# Patient Record
Sex: Female | Born: 1950
Health system: Southern US, Community
[De-identification: ages and names within clinical notes are randomized; demographics above are authoritative.]

## PROBLEM LIST (undated history)

## (undated) ENCOUNTER — Encounter

## (undated) ENCOUNTER — Encounter: Attending: Neurology | Primary: Neurology

## (undated) ENCOUNTER — Ambulatory Visit

## (undated) ENCOUNTER — Encounter: Attending: Nurse Practitioner | Primary: Nurse Practitioner

## (undated) ENCOUNTER — Encounter: Attending: Cardiovascular Disease | Primary: Cardiovascular Disease

## (undated) ENCOUNTER — Telehealth

## (undated) ENCOUNTER — Ambulatory Visit: Payer: Medicare (Managed Care) | Attending: Cardiovascular Disease | Primary: Cardiovascular Disease

## (undated) ENCOUNTER — Ambulatory Visit
Payer: Medicare (Managed Care) | Attending: Student in an Organized Health Care Education/Training Program | Primary: Student in an Organized Health Care Education/Training Program

## (undated) ENCOUNTER — Ambulatory Visit: Payer: PRIVATE HEALTH INSURANCE

## (undated) ENCOUNTER — Ambulatory Visit: Payer: PRIVATE HEALTH INSURANCE | Attending: "Endocrinology | Primary: "Endocrinology

## (undated) ENCOUNTER — Telehealth: Attending: Neurology | Primary: Neurology

## (undated) ENCOUNTER — Ambulatory Visit: Payer: Medicare (Managed Care) | Attending: Neurology | Primary: Neurology

## (undated) ENCOUNTER — Ambulatory Visit: Payer: Medicare (Managed Care)

## (undated) ENCOUNTER — Encounter
Attending: Student in an Organized Health Care Education/Training Program | Primary: Student in an Organized Health Care Education/Training Program

## (undated) ENCOUNTER — Encounter: Attending: Pulmonary Disease | Primary: Pulmonary Disease

## (undated) ENCOUNTER — Telehealth: Attending: Family Medicine | Primary: Family Medicine

## (undated) ENCOUNTER — Telehealth
Attending: Student in an Organized Health Care Education/Training Program | Primary: Student in an Organized Health Care Education/Training Program

## (undated) ENCOUNTER — Telehealth: Attending: Radiation Oncology | Primary: Radiation Oncology

## (undated) ENCOUNTER — Ambulatory Visit: Payer: PRIVATE HEALTH INSURANCE | Attending: Neurology | Primary: Neurology

## (undated) ENCOUNTER — Encounter: Attending: Family | Primary: Family

## (undated) ENCOUNTER — Ambulatory Visit: Payer: PRIVATE HEALTH INSURANCE | Attending: Family | Primary: Family

## (undated) ENCOUNTER — Ambulatory Visit
Payer: PRIVATE HEALTH INSURANCE | Attending: Student in an Organized Health Care Education/Training Program | Primary: Student in an Organized Health Care Education/Training Program

## (undated) ENCOUNTER — Encounter: Attending: Adult Health | Primary: Adult Health

## (undated) ENCOUNTER — Other Ambulatory Visit

## (undated) ENCOUNTER — Ambulatory Visit: Attending: Radiation Oncology | Primary: Radiation Oncology

## (undated) ENCOUNTER — Ambulatory Visit: Payer: PRIVATE HEALTH INSURANCE | Attending: Cardiovascular Disease | Primary: Cardiovascular Disease

## (undated) ENCOUNTER — Ambulatory Visit: Payer: PRIVATE HEALTH INSURANCE | Attending: Adult Health | Primary: Adult Health

## (undated) ENCOUNTER — Encounter: Attending: Pharmacotherapy | Primary: Pharmacotherapy

## (undated) ENCOUNTER — Ambulatory Visit: Payer: PRIVATE HEALTH INSURANCE | Attending: Pulmonary Disease | Primary: Pulmonary Disease

## (undated) ENCOUNTER — Telehealth: Attending: Family | Primary: Family

## (undated) ENCOUNTER — Encounter: Attending: Pharmacist | Primary: Pharmacist

## (undated) ENCOUNTER — Encounter: Attending: Internal Medicine | Primary: Internal Medicine

## (undated) ENCOUNTER — Telehealth: Attending: Adult Health | Primary: Adult Health

## (undated) ENCOUNTER — Encounter: Attending: Physician Assistant | Primary: Physician Assistant

## (undated) ENCOUNTER — Ambulatory Visit
Attending: Student in an Organized Health Care Education/Training Program | Primary: Student in an Organized Health Care Education/Training Program

## (undated) ENCOUNTER — Encounter: Attending: Registered" | Primary: Registered"

## (undated) ENCOUNTER — Encounter: Attending: Radiation Oncology | Primary: Radiation Oncology

## (undated) ENCOUNTER — Ambulatory Visit: Payer: PRIVATE HEALTH INSURANCE | Attending: Pharmacotherapy | Primary: Pharmacotherapy

## (undated) DIAGNOSIS — J449 Chronic obstructive pulmonary disease, unspecified: Secondary | ICD-10-CM

## (undated) DIAGNOSIS — A770 Spotted fever due to Rickettsia rickettsii: Secondary | ICD-10-CM

## (undated) DIAGNOSIS — T733XXA Exhaustion due to excessive exertion, initial encounter: Secondary | ICD-10-CM

## (undated) DIAGNOSIS — E119 Type 2 diabetes mellitus without complications: Secondary | ICD-10-CM

## (undated) DIAGNOSIS — R519 Headache, unspecified: Secondary | ICD-10-CM

## (undated) DIAGNOSIS — G8929 Other chronic pain: Secondary | ICD-10-CM

## (undated) DIAGNOSIS — E785 Hyperlipidemia, unspecified: Secondary | ICD-10-CM

## (undated) DIAGNOSIS — R51 Headache: Secondary | ICD-10-CM

## (undated) DIAGNOSIS — C801 Malignant (primary) neoplasm, unspecified: Secondary | ICD-10-CM

## (undated) DIAGNOSIS — I2510 Atherosclerotic heart disease of native coronary artery without angina pectoris: Secondary | ICD-10-CM

## (undated) DIAGNOSIS — K219 Gastro-esophageal reflux disease without esophagitis: Secondary | ICD-10-CM

## (undated) DIAGNOSIS — I10 Essential (primary) hypertension: Secondary | ICD-10-CM

## (undated) DIAGNOSIS — E039 Hypothyroidism, unspecified: Secondary | ICD-10-CM

## (undated) DIAGNOSIS — D649 Anemia, unspecified: Secondary | ICD-10-CM

## (undated) DIAGNOSIS — G4730 Sleep apnea, unspecified: Secondary | ICD-10-CM

## (undated) HISTORY — DX: Chronic obstructive pulmonary disease, unspecified: J44.9

## (undated) HISTORY — DX: Anemia, unspecified: D64.9

## (undated) HISTORY — PX: REDUCTION MAMMAPLASTY: SUR839

## (undated) HISTORY — PX: BREAST EXCISIONAL BIOPSY: SUR124

## (undated) HISTORY — PX: ABDOMINAL HYSTERECTOMY: SHX81

## (undated) HISTORY — DX: Hypothyroidism, unspecified: E03.9

## (undated) HISTORY — DX: Exhaustion due to excessive exertion, initial encounter: T73.3XXA

## (undated) HISTORY — PX: TIBIA FRACTURE SURGERY: SHX806

## (undated) HISTORY — DX: Headache: R51

## (undated) HISTORY — PX: REPLACEMENT TOTAL KNEE: SUR1224

## (undated) HISTORY — PX: CHOLECYSTECTOMY: SHX55

## (undated) HISTORY — DX: Headache, unspecified: R51.9

## (undated) HISTORY — DX: Gastro-esophageal reflux disease without esophagitis: K21.9

## (undated) HISTORY — DX: Other chronic pain: G89.29

## (undated) HISTORY — PX: SHOULDER SURGERY: SHX246

## (undated) HISTORY — PX: BACK SURGERY: SHX140

## (undated) HISTORY — DX: Spotted fever due to Rickettsia rickettsii: A77.0

## (undated) HISTORY — PX: JOINT REPLACEMENT: SHX530

## (undated) HISTORY — PX: CORONARY STENT PLACEMENT: SHX1402

---
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
## 2005-06-15 ENCOUNTER — Ambulatory Visit: Payer: Self-pay | Admitting: Family Medicine

## 2005-11-06 ENCOUNTER — Ambulatory Visit: Payer: Self-pay | Admitting: Family Medicine

## 2006-03-26 ENCOUNTER — Ambulatory Visit: Payer: Self-pay | Admitting: Nurse Practitioner

## 2006-04-20 ENCOUNTER — Ambulatory Visit: Payer: Self-pay | Admitting: Endocrinology

## 2006-08-26 ENCOUNTER — Inpatient Hospital Stay: Payer: Self-pay | Admitting: Cardiology

## 2006-08-27 ENCOUNTER — Other Ambulatory Visit: Payer: Self-pay

## 2006-10-13 ENCOUNTER — Encounter: Payer: Self-pay | Admitting: Cardiovascular Disease

## 2007-10-12 ENCOUNTER — Ambulatory Visit: Payer: Self-pay | Admitting: General Practice

## 2008-07-17 ENCOUNTER — Ambulatory Visit: Payer: Self-pay | Admitting: General Surgery

## 2008-10-05 ENCOUNTER — Ambulatory Visit: Payer: Self-pay | Admitting: General Surgery

## 2008-12-15 ENCOUNTER — Emergency Department: Payer: Self-pay | Admitting: Unknown Physician Specialty

## 2008-12-28 ENCOUNTER — Ambulatory Visit: Payer: Self-pay | Admitting: Podiatry

## 2009-11-02 ENCOUNTER — Emergency Department: Payer: Self-pay | Admitting: Emergency Medicine

## 2009-11-13 ENCOUNTER — Ambulatory Visit: Payer: Self-pay

## 2010-01-20 ENCOUNTER — Ambulatory Visit: Payer: Self-pay | Admitting: Unknown Physician Specialty

## 2010-01-20 ENCOUNTER — Ambulatory Visit: Payer: Self-pay | Admitting: Cardiovascular Disease

## 2010-01-28 ENCOUNTER — Ambulatory Visit: Payer: Self-pay | Admitting: Unknown Physician Specialty

## 2010-12-09 ENCOUNTER — Ambulatory Visit: Payer: Self-pay | Admitting: Internal Medicine

## 2011-05-05 ENCOUNTER — Ambulatory Visit: Payer: Self-pay | Admitting: Ophthalmology

## 2011-05-19 ENCOUNTER — Ambulatory Visit: Payer: Self-pay | Admitting: Ophthalmology

## 2011-06-02 ENCOUNTER — Ambulatory Visit: Payer: Self-pay | Admitting: Ophthalmology

## 2012-06-22 ENCOUNTER — Ambulatory Visit: Payer: Self-pay | Admitting: Internal Medicine

## 2012-06-30 ENCOUNTER — Observation Stay: Payer: Self-pay | Admitting: Internal Medicine

## 2012-06-30 LAB — CBC WITH DIFFERENTIAL/PLATELET
Eosinophil %: 2.6 %
HGB: 13.2 g/dL (ref 12.0–16.0)
Lymphocyte %: 43.8 %
MCH: 32.9 pg (ref 26.0–34.0)
Monocyte #: 0.4 x10 3/mm (ref 0.2–0.9)
Neutrophil %: 46.1 %
Platelet: 250 10*3/uL (ref 150–440)
RBC: 4.03 10*6/uL (ref 3.80–5.20)
WBC: 6.2 10*3/uL (ref 3.6–11.0)

## 2012-06-30 LAB — COMPREHENSIVE METABOLIC PANEL
Anion Gap: 10 (ref 7–16)
BUN: 18 mg/dL (ref 7–18)
Bilirubin,Total: 0.2 mg/dL (ref 0.2–1.0)
Chloride: 106 mmol/L (ref 98–107)
Co2: 25 mmol/L (ref 21–32)
Creatinine: 0.8 mg/dL (ref 0.60–1.30)
EGFR (African American): >60
Osmolality: 288 (ref 275–301)
Potassium: 3.8 mmol/L (ref 3.5–5.1)
Sodium: 141 mmol/L (ref 136–145)
Total Protein: 8 g/dL (ref 6.4–8.2)

## 2012-06-30 LAB — TROPONIN I: Troponin-I: <0.02 ng/mL

## 2012-06-30 LAB — CK TOTAL AND CKMB (NOT AT ARMC): CK, Total: 60 U/L (ref 21–215)

## 2012-07-01 LAB — CK TOTAL AND CKMB (NOT AT ARMC)
CK, Total: 65 U/L (ref 21–215)
CK, Total: 70 U/L (ref 21–215)
CK-MB: 1.9 ng/mL (ref 0.5–3.6)

## 2012-10-06 ENCOUNTER — Ambulatory Visit: Payer: Self-pay | Admitting: Internal Medicine

## 2012-11-01 ENCOUNTER — Ambulatory Visit (INDEPENDENT_AMBULATORY_CARE_PROVIDER_SITE_OTHER): Payer: BC Managed Care – PPO | Admitting: Pulmonary Disease

## 2012-11-01 ENCOUNTER — Encounter: Payer: Self-pay | Admitting: Pulmonary Disease

## 2012-11-01 VITALS — BP 124/74 | HR 93 | Temp 97.8°F | Ht 66.0 in | Wt 199.0 lb

## 2012-11-01 DIAGNOSIS — R490 Dysphonia: Secondary | ICD-10-CM

## 2012-11-01 DIAGNOSIS — J449 Chronic obstructive pulmonary disease, unspecified: Secondary | ICD-10-CM | POA: Insufficient documentation

## 2012-11-01 DIAGNOSIS — T17308A Unspecified foreign body in larynx causing other injury, initial encounter: Secondary | ICD-10-CM

## 2012-11-01 MED ORDER — TIOTROPIUM BROMIDE MONOHYDRATE 18 MCG IN CAPS: 18.0000 ug | ORAL_CAPSULE | Freq: Every day | RESPIRATORY_TRACT | Status: DC

## 2012-11-01 NOTE — Progress Notes (Signed)
Subjective:    Patient ID: Leslie Olsen, female    DOB: [birth year], 62 y.o.   MRN: [id number]  HPI  This is a very pleasant 62 year old female with a past medical history significant for COPD who comes to our clinic today to establish care for the same. She had a normal childhood without respiratory illnesses but was diagnosed with COPD in February of 2013 when she started developing increasing shortness of breath. She was seen and evaluated by Dr. Meredeth Ide here in [place] as well as Dr. Glenford Bayley at [facility]. In the last year she has been treated with a long acting anticholinergic, Symbicort, and Roflumilast. She was hospitalized in October 2013 for a COPD exacerbation. She still works as a Copy at [employer]. Her most significant symptoms include feeling short of breath with exertion. She states that she is able to pace herself at work and can complete a full day of work but often has to rest. She states that she does not believe that the chemicals at work affect her breathing. In the last several weeks she has been having hoarseness and a feeling that she is choking. This comes on intermittently. It happens more when she is talking to people. She states that it does also occur with exertion such as climbing a flight of stairs. This occurred after an episode of normal the same January 2014. Since then she says she just has not gotten her energy back and she is feeling more fatigued. She continues to use her inhalers as written. She thinks that the Roflumilast is causing diarrhea.  She smoked 2 packs of cigarettes daily for 30 years and quit in March of 2013. During her hospitalization in March 2013 at [facility] she was found to have 3 pulmonary nodules. All of these are less than 6 mm in size. Her most recent CT scan was performed in December 2013.   Past Medical History  Diagnosis Date  . COPD (chronic obstructive pulmonary disease)   . GERD (gastroesophageal reflux disease)   .  Hypothyroidism      Family History  Problem Relation Age of Onset  . Lung cancer Maternal Aunt   . Breast cancer Maternal Uncle      History   Social History  . Marital Status: Divorced    Spouse Name: N/A    Number of Children: N/A  . Years of Education: N/A   Occupational History  . Not on file.   Social History Main Topics  . Smoking status: Former Smoker -- 2.00 packs/day for 47 years    Types: Cigarettes    Quit date: 11/06/2011  . Smokeless tobacco: Never Used  . Alcohol Use: No  . Drug Use: No  . Sexually Active: Not on file   Other Topics Concern  . Not on file   Social History Narrative  . No narrative on file     No Known Allergies   No outpatient prescriptions prior to visit.   No facility-administered medications prior to visit.      Review of Systems  Constitutional: Negative for fever and unexpected weight change.  HENT: Positive for nosebleeds, sneezing and postnasal drip. Negative for ear pain, congestion, sore throat, rhinorrhea, trouble swallowing, dental problem and sinus pressure.   Eyes: Negative for redness and itching.  Respiratory: Positive for chest tightness and shortness of breath. Negative for cough and wheezing.   Cardiovascular: Negative for palpitations and leg swelling.  Gastrointestinal: Negative for nausea and vomiting.  Genitourinary: Negative  for dysuria.  Musculoskeletal: Negative for joint swelling.  Skin: Negative for rash.  Neurological: Negative for headaches.  Hematological: Does not bruise/bleed easily.  Psychiatric/Behavioral: Negative for dysphoric mood. The patient is not nervous/anxious.        Objective:   Physical Exam  Filed Vitals:   11/01/12 1503  BP: 124/74  Pulse: 93  Temp: 97.8 F (36.6 C)  TempSrc: Oral  Height: 5\' 6"  (1.676 m)  Weight: 199 lb (90.266 kg)  SpO2: 97%   Gen: chronically ill appearing, no acute distress HEENT: NCAT, PERRL, EOMi, OP clear, neck supple without masses PULM:  scant wheezing, some crackles in bases CV: RRR, slight systolic murmur, no JVD AB: BS+, soft, nontender, no hsm Ext: warm, trace edema, no clubbing, no cyanosis Derm: no rash or skin breakdown Neuro: A&Ox4, CN II-XII intact, strength 5/5 in all 4 extremities  11/01/2012 >> ratio 73%, flow volume loop consistent with obstruction, FEV1 1.94L (76% pred)     Assessment & Plan:   COPD (chronic obstructive pulmonary disease) The shape of Ms. Barkan's flow volume loop is consistent with obstruction, though it is moderate and not severe. She has only had one COPD exacerbation in her life in October 2013 thus making her a GOLD grade A.  I do not think she needs to be on as many medications as she is currently. She describes the sensation of hoarseness and choking which I think is out of proportion to her COPD and needs to be further evaluated.  She is deconditioned and could benefit from pulmonary rehabilitation.  Plan: -Start pulmonary rehabilitation -change Tudorza to Spiriva -Stop roflumilast -consider stopping symbicort on next visit -obtain records from Dr. Mayo Ao and [facility] pulmonary (Dr. Maree Erie)  Hoarseness I question a diagnosis of vocal cord dysfunction. She describes intermittent sensations of shortness of breath and a "blockage in her throat". I see no proximal trachea lesions on her recent CT chest.  Plan: -Refer to ear nose and throat for further evaluation -If no airway lesion or vocal cord dysfunction seen would stop Advair as this can sometimes cause laryngeal irritation  Multiple pulmonary nodules Fortunately these lesions are small and have not grown over 10 months of repeated CT scans. She will need a repeat CT scan of her chest in December 2014.    Updated Medication List Outpatient Encounter Prescriptions as of 11/01/2012  Medication Sig Dispense Refill  . albuterol (PROVENTIL HFA;VENTOLIN HFA) 108 (90 BASE) MCG/ACT inhaler Inhale 2 puffs into the lungs every 6  (six) hours as needed for wheezing.      Marland Kitchen aspirin 81 MG tablet Take 81 mg by mouth daily.      . budesonide-formoterol (SYMBICORT) 160-4.5 MCG/ACT inhaler Inhale 2 puffs into the lungs 2 (two) times daily.      Marland Kitchen esomeprazole (NEXIUM) 20 MG capsule Take 20 mg by mouth daily before breakfast.      . levothyroxine (SYNTHROID, LEVOTHROID) 88 MCG tablet Take 88 mcg by mouth daily.      . Omega-3 Fatty Acids (FISH OIL) 1200 MG CAPS Take 1 capsule by mouth daily.      . Red Yeast Rice 600 MG CAPS Take 2 capsules by mouth daily.      . [DISCONTINUED] Aclidinium Bromide (TUDORZA PRESSAIR) 400 MCG/ACT AEPB Inhale 1 puff into the lungs 2 (two) times daily.      . [DISCONTINUED] roflumilast (DALIRESP) 500 MCG TABS tablet Take 500 mcg by mouth daily.      Marland Kitchen tiotropium (SPIRIVA HANDIHALER)  18 MCG inhalation capsule Place 1 capsule (18 mcg total) into inhaler and inhale daily.  30 capsule  2   No facility-administered encounter medications on file as of 11/01/2012.

## 2012-11-01 NOTE — Assessment & Plan Note (Signed)
The shape of Leslie Olsen's flow volume loop is consistent with obstruction, though it is moderate and not severe. She has only had one COPD exacerbation in her life in October 2013 thus making her a GOLD grade A.  I do not think she needs to be on as many medications as she is currently. She describes the sensation of hoarseness and choking which I think is out of proportion to her COPD and needs to be further evaluated.  She is deconditioned and could benefit from pulmonary rehabilitation.  Plan: -Start pulmonary rehabilitation -change Tudorza to Spiriva -Stop roflumilast -consider stopping symbicort on next visit -obtain records from Leslie Olsen and [facility] pulmonary (Leslie Olsen)

## 2012-11-01 NOTE — Patient Instructions (Addendum)
Stop tudorza Start spiriva Stop darilesp Start pulmonary rehab at [facility] will need a CT scan of your Chest in 08/2013 for a pulmonary nodule; we will have this performed at [facility]  We will see you back in 2 months or sooner if needed

## 2012-11-01 NOTE — Assessment & Plan Note (Signed)
I question a diagnosis of vocal cord dysfunction. She describes intermittent sensations of shortness of breath and a "blockage in her throat". I see no proximal trachea lesions on her recent CT chest.  Plan: -Refer to ear nose and throat for further evaluation -If no airway lesion or vocal cord dysfunction seen would stop Advair as this can sometimes cause laryngeal irritation

## 2012-11-02 DIAGNOSIS — R918 Other nonspecific abnormal finding of lung field: Secondary | ICD-10-CM | POA: Insufficient documentation

## 2012-11-02 NOTE — Assessment & Plan Note (Signed)
Fortunately these lesions are small and have not grown over 10 months of repeated CT scans. She will need a repeat CT scan of her chest in December 2014.

## 2012-11-04 ENCOUNTER — Ambulatory Visit: Payer: Self-pay | Admitting: Internal Medicine

## 2012-11-08 ENCOUNTER — Telehealth: Payer: Self-pay | Admitting: Pulmonary Disease

## 2012-11-08 MED ORDER — TIOTROPIUM BROMIDE MONOHYDRATE 18 MCG IN CAPS: 18.0000 ug | ORAL_CAPSULE | Freq: Every day | RESPIRATORY_TRACT | Status: DC

## 2012-11-08 NOTE — Telephone Encounter (Signed)
Called and spoke with pt and she is aware of rx for the spiriva that has been sent to her pharmacy.  Nothing further is needed.

## 2012-11-28 ENCOUNTER — Encounter: Payer: Self-pay | Admitting: Pulmonary Disease

## 2013-01-03 ENCOUNTER — Ambulatory Visit: Payer: BC Managed Care – PPO | Admitting: Pulmonary Disease

## 2013-01-24 ENCOUNTER — Ambulatory Visit (INDEPENDENT_AMBULATORY_CARE_PROVIDER_SITE_OTHER): Payer: BC Managed Care – PPO | Admitting: Pulmonary Disease

## 2013-01-24 ENCOUNTER — Encounter: Payer: Self-pay | Admitting: Pulmonary Disease

## 2013-01-24 VITALS — BP 112/70 | HR 85 | Temp 97.7°F | Ht 66.0 in | Wt 197.0 lb

## 2013-01-24 DIAGNOSIS — J449 Chronic obstructive pulmonary disease, unspecified: Secondary | ICD-10-CM

## 2013-01-24 DIAGNOSIS — R490 Dysphonia: Secondary | ICD-10-CM

## 2013-01-24 DIAGNOSIS — Z9989 Dependence on other enabling machines and devices: Secondary | ICD-10-CM

## 2013-01-24 DIAGNOSIS — R918 Other nonspecific abnormal finding of lung field: Secondary | ICD-10-CM

## 2013-01-24 DIAGNOSIS — G4733 Obstructive sleep apnea (adult) (pediatric): Secondary | ICD-10-CM

## 2013-01-24 NOTE — Patient Instructions (Addendum)
Stop using the Spiriva; let us know or your primary care doctor know if this does not make the headache go away  Keep using Symbicort twice a day, but use it with a spacer  Keep using your CPAP as you are doing  We will order a CT scan of your chest in 08/2012 to evaluate your pulmonary nodules  We will see you back in clinic after the CT scan or sooner if needed

## 2013-01-24 NOTE — Assessment & Plan Note (Signed)
She should continue using CPAP as written.

## 2013-01-24 NOTE — Assessment & Plan Note (Signed)
The most recent study was stable.  Plan: -08/2013 CT chest without contrast

## 2013-01-24 NOTE — Assessment & Plan Note (Signed)
She is doing well from a COPD standpoint and really doesn't need the Spiriva.  Plan: -continue symbicort with spacer -d/c spiriva

## 2013-01-24 NOTE — Progress Notes (Signed)
Subjective:    Patient ID: Leslie Olsen, female    DOB: Dec 31, 1950, 62 y.o.   MRN: [id number]  Synopsis: This is a 62 year old female with GOLD grade C COPD who first saw the [facility] pulmonary clinic in early 2014 for evaluation of the same. She was hospitalized for an exacerbation of COPD in March 2013, October 2013. She also has obstructive sleep apnea and uses CPAP. She had been intolerant of Roflumilast as it caused diarrhea. She previously smoked 2 packs of cigarettes daily for 30 years and quit in March of 2013.   HPI  01/24/2013 ROV >> Leslie Olsen has been doing well since her last visit. She attributes most of this to starting CPAP. She says that she is now sleeping throughout the night and she feels great in the morning. She has significantly more energy throughout the day. She uses her CPAP machine every night. She states that she still has some hoarseness and recently saw ear nose and throat thought it was related to gastroesophageal reflux disease. She experiences previously to changing to [place] but states that since then she has not experienced a change in the hoarseness or the shortness of breath. She is also had some headache since starting the Spiriva. She continues to use the Symbicort twice a day. She has some shortness of breath on exertion but in general is much better than previously.  Past Medical History  Diagnosis Date  . COPD (chronic obstructive pulmonary disease)   . GERD (gastroesophageal reflux disease)   . Hypothyroidism      Review of Systems  Constitutional: Negative for fever, chills and fatigue.  HENT: Positive for voice change. Negative for nosebleeds, congestion, rhinorrhea, postnasal drip and sinus pressure.   Respiratory: Negative for cough, shortness of breath and wheezing.   Cardiovascular: Negative for chest pain, palpitations and leg swelling.       Objective:   Physical Exam  Filed Vitals:   01/24/13 1426  BP: 112/70  Pulse: 85  Temp:  97.7 F (36.5 C)  TempSrc: Oral  Height: 5\' 6"  (1.676 m)  Weight: 197 lb (89.359 kg)  SpO2: 97%   RA  Gen: well appearing, no acute distress HEENT: NCAT, EOMi, OP clear,  PULM: CTA B CV: RRR, no mgr, no JVD AB: BS+, soft, nontender, no hsm Ext: warm, no edema, no clubbing, no cyanosis      Assessment & Plan:   COPD (chronic obstructive pulmonary disease) She is doing well from a COPD standpoint and really doesn't need the Spiriva.  Plan: -continue symbicort with spacer -d/c spiriva  Hoarseness Dr. Willeen Cass feels that this is related to GERD.  She is not following lifestyle modifications for this.  I also wonder if the Spiriva is contributing as dry powder inhalers can sometimes cause this.  Plan: -lifestyle modifications for GERD reviewed -Stop spiriva -continue PPI  Multiple pulmonary nodules The most recent study was stable.  Plan: -08/2013 CT chest without contrast  OSA on CPAP She should continue using CPAP as written.   Updated Medication List Outpatient Encounter Prescriptions as of 01/24/2013  Medication Sig Dispense Refill  . albuterol (PROVENTIL HFA;VENTOLIN HFA) 108 (90 BASE) MCG/ACT inhaler Inhale 2 puffs into the lungs every 6 (six) hours as needed for wheezing.      Marland Kitchen aspirin 81 MG tablet Take 81 mg by mouth daily.      . budesonide-formoterol (SYMBICORT) 160-4.5 MCG/ACT inhaler Inhale 2 puffs into the lungs 2 (two) times daily.      Marland Kitchen  esomeprazole (NEXIUM) 20 MG capsule Take 20 mg by mouth daily before breakfast.      . levothyroxine (SYNTHROID, LEVOTHROID) 88 MCG tablet Take 88 mcg by mouth daily.      . Omega-3 Fatty Acids (FISH OIL) 1200 MG CAPS Take 1 capsule by mouth daily.      . Red Yeast Rice 600 MG CAPS Take 2 capsules by mouth daily.      . [DISCONTINUED] tiotropium (SPIRIVA HANDIHALER) 18 MCG inhalation capsule Place 1 capsule (18 mcg total) into inhaler and inhale daily.  30 capsule  2   No facility-administered encounter medications  on file as of 01/24/2013.

## 2013-01-24 NOTE — Assessment & Plan Note (Signed)
Dr. Willeen Cass feels that this is related to GERD.  She is not following lifestyle modifications for this.  I also wonder if the Spiriva is contributing as dry powder inhalers can sometimes cause this.  Plan: -lifestyle modifications for GERD reviewed -Stop spiriva -continue PPI

## 2013-02-07 ENCOUNTER — Encounter: Payer: Self-pay | Admitting: *Deleted

## 2013-02-07 ENCOUNTER — Encounter: Payer: Self-pay | Admitting: Pulmonary Disease

## 2013-02-07 ENCOUNTER — Ambulatory Visit (INDEPENDENT_AMBULATORY_CARE_PROVIDER_SITE_OTHER): Payer: BC Managed Care – PPO | Admitting: Pulmonary Disease

## 2013-02-07 ENCOUNTER — Ambulatory Visit (INDEPENDENT_AMBULATORY_CARE_PROVIDER_SITE_OTHER)
Admission: RE | Admit: 2013-02-07 | Discharge: 2013-02-07 | Disposition: A | Discharge status: Home / Self Care | Payer: BC Managed Care – PPO | Source: Ambulatory Visit | Attending: Pulmonary Disease | Admitting: Pulmonary Disease

## 2013-02-07 VITALS — BP 130/62 | HR 88 | Temp 98.2°F | Ht 66.0 in | Wt 197.0 lb

## 2013-02-07 DIAGNOSIS — R918 Other nonspecific abnormal finding of lung field: Secondary | ICD-10-CM

## 2013-02-07 DIAGNOSIS — J449 Chronic obstructive pulmonary disease, unspecified: Secondary | ICD-10-CM

## 2013-02-07 DIAGNOSIS — R0602 Shortness of breath: Secondary | ICD-10-CM

## 2013-02-07 DIAGNOSIS — R490 Dysphonia: Secondary | ICD-10-CM

## 2013-02-07 MED ORDER — PREDNISONE 10 MG PO TABS: 30.0000 mg | ORAL_TABLET | Freq: Every day | ORAL | Status: AC

## 2013-02-07 NOTE — Patient Instructions (Addendum)
Take the prednisone for 5 days  Go get a Chest X-ray and we will call you with the results  Stop using your current allergy medicine and replace it with chlorpheniramine/phenylephrine to use every 6 hours as needed for hoarseness  Use biotene rinse at least twice a day > before you go to bed and after you wake up  Use jolly ranchers or other hard candies to keep your throat moist  Try to rest your voice as much as possible  Increase the dose of your Esomeprazole (nexium) to one pill twice a day with meals  If you have not experienced improvement in your hoarseness in 3-4 weeks, then talk to [facility] about another visit  We will see you back in 3 months or sooner if needed

## 2013-02-07 NOTE — Progress Notes (Signed)
Subjective:    Patient ID: Leslie Olsen, female    DOB: 11/16/1950, 62 y.o.   MRN: [id number]  Synopsis: This is a 62 year old female with GOLD grade C COPD who first saw the [facility] pulmonary clinic in early 2014 for evaluation of the same. She was hospitalized for an exacerbation of COPD in March 2013, October 2013. She also has obstructive sleep apnea and uses CPAP. She had been intolerant of Roflumilast as it caused diarrhea. She previously smoked 2 packs of cigarettes daily for 30 years and quit in March of 2013.   HPI   01/24/2013 ROV >> Leslie Olsen has been doing well since her last visit. She attributes most of this to starting CPAP. She says that she is now sleeping throughout the night and she feels great in the morning. She has significantly more energy throughout the day. She uses her CPAP machine every night. She states that she still has some hoarseness and recently saw ear nose and throat thought it was related to gastroesophageal reflux disease. She experiences previously to changing to [place] but states that since then she has not experienced a change in the hoarseness or the shortness of breath. She is also had some headache since starting the Spiriva. She continues to use the Symbicort twice a day. She has some shortness of breath on exertion but in general is much better than previously.  02/07/2013 ROV >> Leslie Olsen states that since the last visit she has been feeling exhausted and still has hoarseness. She doesn't feel that her throat has been normal for several months. She states that about 10 days ago she was exposed to an of them cleaner at work and ever since then she's had increasing shortness of breath and hoarseness. This is been associated with increasing wheezing. She states that the use of albuterol has not helped. She's used it some. She is still using the Symbicort with a spacer. She is rinsing her mouth out afterwards. She's not using Spiriva. She recently started using  an allergy pill but stated that it did not help with the hoarseness. She has been exposed to a lot of mold and mildew at work. She continues to use CPAP and states that she has dry mouth after using it.   Past Medical History  Diagnosis Date  . COPD (chronic obstructive pulmonary disease)   . GERD (gastroesophageal reflux disease)   . Hypothyroidism      Review of Systems  Constitutional: Negative for fever, chills and fatigue.  HENT: Positive for voice change. Negative for nosebleeds, congestion, rhinorrhea, postnasal drip and sinus pressure.   Respiratory: Negative for cough, shortness of breath and wheezing.   Cardiovascular: Negative for chest pain, palpitations and leg swelling.       Objective:   Physical Exam   Filed Vitals:   02/07/13 1026  BP: 130/62  Pulse: 88  Temp: 98.2 F (36.8 C)  TempSrc: Oral  Height: 5\' 6"  (1.676 m)  Weight: 197 lb (89.359 kg)  SpO2: 98%   RA  Gen: well appearing, no acute distress HEENT: NCAT, EOMi, OP clear,  PULM: few fine exp wheezes, no stridor CV: RRR, slight systolic murmur, no JVD AB: BS+, soft, nontender, no hsm Ext: warm, no edema, no clubbing, no cyanosis      Assessment & Plan:   COPD (chronic obstructive pulmonary disease) Leslie Olsen describes increasing shortness of breath and wheezing. I do hear some fine pulmonary wheezing on exam today. Because this all started after exposure to a  chemical at work it's not unreasonable to treat her for COPD exacerbation.  I am reassured that today's simple spirometry was actually a bit better than her last study.  However I believe that most of this is related to her upper airway hoarseness. I think this is likely related to postnasal drip and gastroesophageal reflux disease.  Plan: -Chest x-ray -Short burst of prednisone -We will treat the hoarseness aggressively with an increased dose of omeprazole, and the addition of a nasal steroid. -If no improvement in the wheezing or  hoarseness in the next few weeks then I think she needs to see [facility] (Dr. Willeen Cass).  Hoarseness -Increase omeprazole -Reflux lifestyle modifications were reviewed and encouraged to be followed again -Treat allergic rhinitis with nasal steroid, Chlor-Trimeton, and decongestants -Voice rest -Keep throat moist with hard candies -If no improvement in 3-4 weeks then return to Dr. Willeen Cass at [facility] ear nose and throat  Multiple pulmonary nodules Repeat CT 08/2013    Updated Medication List Outpatient Encounter Prescriptions as of 02/07/2013  Medication Sig Dispense Refill  . albuterol (PROVENTIL HFA;VENTOLIN HFA) 108 (90 BASE) MCG/ACT inhaler Inhale 2 puffs into the lungs every 6 (six) hours as needed for wheezing.      Marland Kitchen aspirin 81 MG tablet Take 81 mg by mouth daily.      . budesonide-formoterol (SYMBICORT) 160-4.5 MCG/ACT inhaler Inhale 2 puffs into the lungs 2 (two) times daily.      Marland Kitchen esomeprazole (NEXIUM) 20 MG capsule Take 20 mg by mouth daily before breakfast.      . levothyroxine (SYNTHROID, LEVOTHROID) 88 MCG tablet Take 88 mcg by mouth daily.      . Omega-3 Fatty Acids (FISH OIL) 1200 MG CAPS Take 1 capsule by mouth daily.      . Red Yeast Rice 600 MG CAPS Take 2 capsules by mouth daily.       No facility-administered encounter medications on file as of 02/07/2013.

## 2013-02-07 NOTE — Assessment & Plan Note (Signed)
-  Increase omeprazole -Reflux lifestyle modifications were reviewed and encouraged to be followed again -Treat allergic rhinitis with nasal steroid, Chlor-Trimeton, and decongestants -Voice rest -Keep throat moist with hard candies -If no improvement in 3-4 weeks then return to Dr. Willeen Cass at [facility] ear nose and throat

## 2013-02-07 NOTE — Assessment & Plan Note (Signed)
Leslie Olsen describes increasing shortness of breath and wheezing. I do hear some fine pulmonary wheezing on exam today. Because this all started after exposure to a chemical at work it's not unreasonable to treat her for COPD exacerbation.  I am reassured that today's simple spirometry was actually a bit better than her last study.  However I believe that most of this is related to her upper airway hoarseness. I think this is likely related to postnasal drip and gastroesophageal reflux disease.  Plan: -Chest x-ray -Short burst of prednisone -We will treat the hoarseness aggressively with an increased dose of omeprazole, and the addition of a nasal steroid. -If no improvement in the wheezing or hoarseness in the next few weeks then I think she needs to see [facility] (Dr. Willeen Cass).

## 2013-02-07 NOTE — Assessment & Plan Note (Signed)
Repeat CT 08/2013

## 2013-02-08 ENCOUNTER — Telehealth: Payer: Self-pay | Admitting: Pulmonary Disease

## 2013-02-08 NOTE — Telephone Encounter (Signed)
Called, spoke with pt.  She is requesting cxr results.  Dr. Kendrick Fries, pls advise.  Thank you.

## 2013-02-08 NOTE — Telephone Encounter (Signed)
Please let her know it is normal

## 2013-02-09 NOTE — Telephone Encounter (Signed)
I spoke with patient about results and she verbalized understanding and had no questions 

## 2013-02-22 ENCOUNTER — Other Ambulatory Visit: Payer: Self-pay | Admitting: Internal Medicine

## 2013-02-22 MED ORDER — BUDESONIDE-FORMOTEROL FUMARATE 160-4.5 MCG/ACT IN AERO: 2.0000 | INHALATION_SPRAY | Freq: Two times a day (BID) | RESPIRATORY_TRACT | Status: DC

## 2013-05-09 ENCOUNTER — Encounter: Payer: Self-pay | Admitting: Pulmonary Disease

## 2013-07-04 ENCOUNTER — Telehealth: Payer: Self-pay | Admitting: Pulmonary Disease

## 2013-07-04 NOTE — Telephone Encounter (Signed)
I have referral for ct to schedule in dec 2014 @ [facility].  I will sched appt and call the patient. No referral is needed I have it

## 2013-07-04 NOTE — Telephone Encounter (Signed)
Pt is calling to schedule CT scan for December. Order was placed in 01-2013. Does a new order need to be placed? Carron Curie, CMA

## 2013-07-21 ENCOUNTER — Ambulatory Visit: Payer: Self-pay | Admitting: Internal Medicine

## 2013-08-23 ENCOUNTER — Ambulatory Visit: Payer: Self-pay | Admitting: Pulmonary Disease

## 2013-08-24 ENCOUNTER — Telehealth: Payer: Self-pay

## 2013-08-24 ENCOUNTER — Encounter: Payer: Self-pay | Admitting: Pulmonary Disease

## 2013-08-24 NOTE — Telephone Encounter (Signed)
Spoke with pt, she is scheduled for tomorrow at 2pm.  Nothing further needed at this time. Caulfield,Ashley L, CMA

## 2013-08-24 NOTE — Telephone Encounter (Signed)
Message copied by Velvet Bathe on Thu Aug 24, 2013  2:02 PM ------      Message from: Max Fickle B      Created: Thu Aug 24, 2013  1:35 PM       A,            Please let her know that her CT scan looked great and has not changed.  She needs a final one in 08/2014            Thanks      B ------

## 2013-08-24 NOTE — Telephone Encounter (Signed)
We called Leslie Olsen to let her know that her CT scan looked OK.  She mentioned increasing dyspnea lately without cough, fever, chills.  We advised that she see [place] tomorrow, 12/19 in [place], she thinks she can do this.

## 2013-08-24 NOTE — Telephone Encounter (Signed)
Pt aware of results.  Nothing further needed. Caulfield,Ashley L, CMA

## 2013-08-25 ENCOUNTER — Ambulatory Visit (INDEPENDENT_AMBULATORY_CARE_PROVIDER_SITE_OTHER): Payer: BC Managed Care – PPO | Admitting: Pulmonary Disease

## 2013-08-25 ENCOUNTER — Encounter: Payer: Self-pay | Admitting: Pulmonary Disease

## 2013-08-25 VITALS — BP 134/76 | HR 100 | Temp 97.5°F | Ht 66.0 in | Wt 205.0 lb

## 2013-08-25 DIAGNOSIS — R499 Unspecified voice and resonance disorder: Secondary | ICD-10-CM | POA: Insufficient documentation

## 2013-08-25 DIAGNOSIS — R498 Other voice and resonance disorders: Secondary | ICD-10-CM

## 2013-08-25 NOTE — Progress Notes (Signed)
Subjective:    Patient ID: Leslie Olsen, female    DOB: 06-08-1951, 62 y.o.   MRN: [id number]  Synopsis: This is a 62 year old female with GOLD grade C COPD who first saw the [facility] pulmonary clinic in early 2014 for evaluation of the same. She was hospitalized for an exacerbation of COPD in March 2013, October 2013. She also has obstructive sleep apnea and uses CPAP. She had been intolerant of Roflumilast as it caused diarrhea. She previously smoked 2 packs of cigarettes daily for 30 years and quit in March of 2013.   HPI   01/24/2013 ROV >> [place] has been doing well since her last visit. She attributes most of this to starting CPAP. She says that she is now sleeping throughout the night and she feels great in the morning. She has significantly more energy throughout the day. She uses her CPAP machine every night. She states that she still has some hoarseness and recently saw ear nose and throat thought it was related to gastroesophageal reflux disease. She experiences previously to changing to [place] but states that since then she has not experienced a change in the hoarseness or the shortness of breath. She is also had some headache since starting the Spiriva. She continues to use the Symbicort twice a day. She has some shortness of breath on exertion but in general is much better than previously.  02/07/2013 ROV >> Antha states that since the last visit she has been feeling exhausted and still has hoarseness. She doesn't feel that her throat has been normal for several months. She states that about 10 days ago she was exposed to an of them cleaner at work and ever since then she's had increasing shortness of breath and hoarseness. This is been associated with increasing wheezing. She states that the use of albuterol has not helped. She's used it some. She is still using the Symbicort with a spacer. She is rinsing her mouth out afterwards. She's not using Spiriva. She recently started using  an allergy pill but stated that it did not help with the hoarseness. She has been exposed to a lot of mold and mildew at work. She continues to use CPAP and states that she has dry mouth after using it.  08/25/2013 ROV > Pama's voice is markedly different than last time.  She feels like she is choking more. This has been persistant for a while now.  No change in cough lately.  In fact she rarely coughs.  She can feel some wheezing on occasion.  She never lies flat so unknown if orthopnea.  She has been using the CPAP machine regularly which helps with quality of sleep at night which is still OK.  It feels like something is choking her all the time.  Some days are worse than others.  She has been taking nexium and chlortrimeton which has helped.  She has been using symbicort regularly and has used proAir.  ProAir helps.  She has had a lot of headache lately.    Past Medical History  Diagnosis Date  . COPD (chronic obstructive pulmonary disease)   . GERD (gastroesophageal reflux disease)   . Hypothyroidism      Review of Systems  Constitutional: Negative for fever, chills and fatigue.  HENT: Positive for voice change. Negative for congestion, nosebleeds, postnasal drip, rhinorrhea and sinus pressure.   Respiratory: Negative for cough, shortness of breath and wheezing.   Cardiovascular: Negative for chest pain, palpitations and leg swelling.  Objective:   Physical Exam   Filed Vitals:   08/25/13 1414  BP: 134/76  Pulse: 100  Temp: 97.5 F (36.4 C)  TempSrc: Oral  Height: 5\' 6"  (1.676 m)  Weight: 205 lb (92.987 kg)  SpO2: 98%   RA  Gen: anxious, no acute distress HEENT: NCAT, EOMi, OP clear,  PULM: CTA B CV: RRR, slight systolic murmur, no JVD AB: BS+, soft, nontender, no hsm Ext: warm, no edema, no clubbing, no cyanosis Neuro: speech is difficult to initiate but clear, no hoarsenss, strength good all over, no rigidity noted in upper extremities      Assessment &  Plan:   Hoarseness or changing voice Pauleen's voice is clearly different than before, and this time it is not hoarseness that is the problem.  She has stuttering nature with her speech with much difficulty with physically getting words out.  Her dyspnea has not really changed during this time though she notes intermittent sensation of choking.  She is not wheezing nor in respiratory distress. This has not gotten better with GERD and rhinitis treatment and I am suspicious that this could represent a movement disorder or other neurologic problem.  Plan: -refer to neurology for evaluation of change in voice; specific question is> Is this a movement disorder? Is another neurologic problem involved? -if no clear neurologic problem involved, then refer back to ENT    Updated Medication List Outpatient Encounter Prescriptions as of 08/25/2013  Medication Sig  . albuterol (PROVENTIL HFA;VENTOLIN HFA) 108 (90 BASE) MCG/ACT inhaler Inhale 2 puffs into the lungs every 6 (six) hours as needed for wheezing.  Marland Kitchen aspirin 81 MG tablet Take 81 mg by mouth daily.  . budesonide-formoterol (SYMBICORT) 160-4.5 MCG/ACT inhaler Inhale 2 puffs into the lungs 2 (two) times daily.  Marland Kitchen esomeprazole (NEXIUM) 20 MG capsule Take 20 mg by mouth daily before breakfast.  . levothyroxine (SYNTHROID, LEVOTHROID) 88 MCG tablet Take 112 mcg by mouth daily.   . Omega-3 Fatty Acids (FISH OIL) 1200 MG CAPS Take 1 capsule by mouth daily.  . Red Yeast Rice 600 MG CAPS Take 2 capsules by mouth daily.

## 2013-08-25 NOTE — Assessment & Plan Note (Addendum)
Leslie Olsen's voice is clearly different than before, and this time it is not hoarseness that is the problem.  She has stuttering nature with her speech with much difficulty with physically getting words out.  Her dyspnea has not really changed during this time though she notes intermittent sensation of choking.  She is not wheezing nor in respiratory distress. This has not gotten better with GERD and rhinitis treatment and I am suspicious that this could represent a movement disorder or other neurologic problem.  Plan: -refer to neurology for evaluation of change in voice; specific question is> Is this a movement disorder? Is another neurologic problem involved? -if no clear neurologic problem involved, then refer back to ENT

## 2013-08-25 NOTE — Patient Instructions (Signed)
We are going to refer you to a neurologist to evaluate the change in your voice.  It is not due to a lung problem and I want them to see if you have a neurologic problem contributing to this. Keep taking the Symbicort We will see you back in a year with another CT scan or sooner if needed

## 2013-09-20 ENCOUNTER — Ambulatory Visit: Payer: BC Managed Care – PPO | Admitting: Pulmonary Disease

## 2013-09-21 ENCOUNTER — Encounter: Payer: Self-pay | Admitting: Pulmonary Disease

## 2013-09-27 ENCOUNTER — Encounter: Payer: Self-pay | Admitting: Neurology

## 2013-09-27 ENCOUNTER — Ambulatory Visit (INDEPENDENT_AMBULATORY_CARE_PROVIDER_SITE_OTHER): Payer: BC Managed Care – PPO | Admitting: Neurology

## 2013-09-27 VITALS — BP 120/70 | HR 68 | Temp 97.8°F | Ht 66.0 in | Wt 205.0 lb

## 2013-09-27 DIAGNOSIS — G4486 Cervicogenic headache: Secondary | ICD-10-CM

## 2013-09-27 DIAGNOSIS — R498 Other voice and resonance disorders: Secondary | ICD-10-CM

## 2013-09-27 DIAGNOSIS — R499 Unspecified voice and resonance disorder: Secondary | ICD-10-CM

## 2013-09-27 DIAGNOSIS — R51 Headache: Secondary | ICD-10-CM

## 2013-09-27 LAB — CK: CK TOTAL: 78 U/L (ref 7–177)

## 2013-09-27 MED ORDER — NORTRIPTYLINE HCL 10 MG PO CAPS
ORAL_CAPSULE | ORAL | Status: DC
Start: 2013-09-27 — End: 2014-01-09

## 2013-09-27 NOTE — Progress Notes (Signed)
[facility] Clinic Note - Initial Visit   Date: 09/27/2013    Leslie Olsen MRN: [id number] DOB: February 24, 1951   Dear Dr Lake Bells:  Thank you for your kind referral of Leslie Olsen for consultation of hoarseness. Although her history is well known to you, please allow [place] to reiterate it for the purpose of our medical record. The patient was accompanied to the clinic by self.    History of Present Illness: Leslie Olsen is a 63 y.o. right-handed Caucasian female with history of COPD, OSA on CPAP, GERD, and Graves disease s/p radioactive iodine treatment now with hypothyroidism presenting for evaluation of change in voice.  Since early 2014, she developed intermittent and fluctuating hoarseness of her voice.  She also has a sensation as if there is something in her neck that closes up and as if she is unable to get enough air.  Symptoms fluctuate and she can gave good days and bad days.  She denies any diurnal symptoms or improvement with rest.  She has noticed intermittent spells of choking with solids and liquids.  She feels tired and rest does not overcome it.  Denies double vision or weakness of the arms or legs.  Over the past three weeks, she feels that her symptoms have progressively worsened.  She has previously seen ENT in Spring 2014 and they suggested that her symptoms may be due to GERD. Reflex medications were adjusted, but there has been no change in her symptoms.  She started using a CPAP in April 2014.  She is fearful that she may stop breathing in the night.   She also complains of headache that started a few months ago and describes it as daily.  Pain is at the back of her head, described as pressure-like.  She says that it is irritating, but not severe where she needs to go to bed.  No photophobia, phonophobia, nausea, and vomiting.  She takes ibuprofen and tylenol daily for the pain.  Out-side paper records, electronic medical record, and images  have been reviewed where available and summarized as: CT chest 08/23/2013:  1.  Scattered pulmonary nodules measuring up to 7 mm along the minor fissure, unchanged from 2013.  2.  Vague area of subpleural ground glass in the apical segment of the right upper lobe 3.  Coronary artery calcification 4.  Bilateral adrenal adenomas, incompletely imaged.  Labs 01/04/2013: TSH 0.685, thyroxine 7.7, T3 25% hemoglobin A1c 6.2  Past Medical History  Diagnosis Date  . COPD (chronic obstructive pulmonary disease)   . GERD (gastroesophageal reflux disease)   . Hypothyroidism     Past Surgical History  Procedure Laterality Date  . Abdominal hysterectomy    . Back surgery    . Shoulder surgery    . Coronary stent placement    . Tibia fracture surgery    . Replacement total knee       Medications:  Current Outpatient Prescriptions on File Prior to Visit  Medication Sig Dispense Refill  . albuterol (PROVENTIL HFA;VENTOLIN HFA) 108 (90 BASE) MCG/ACT inhaler Inhale 2 puffs into the lungs every 6 (six) hours as needed for wheezing.      Marland Kitchen aspirin 81 MG tablet Take 81 mg by mouth daily.      . budesonide-formoterol (SYMBICORT) 160-4.5 MCG/ACT inhaler Inhale 2 puffs into the lungs 2 (two) times daily.  1 Inhaler  2  . esomeprazole (NEXIUM) 20 MG capsule Take 20 mg by mouth daily before breakfast.      .  levothyroxine (SYNTHROID, LEVOTHROID) 88 MCG tablet Take 112 mcg by mouth daily.       . Omega-3 Fatty Acids (FISH OIL) 1200 MG CAPS Take 1 capsule by mouth daily.      . Red Yeast Rice 600 MG CAPS Take 2 capsules by mouth daily.       No current facility-administered medications on file prior to visit.    Allergies: No Known Allergies  Family History: Family History  Problem Relation Age of Onset  . Lung cancer Maternal Aunt   . Breast cancer Maternal Uncle     Social History: History   Social History  . Marital Status: Divorced    Spouse Name: N/A    Number of Children: N/A  . Years  of Education: N/A   Occupational History  . Not on file.   Social History Main Topics  . Smoking status: Former Smoker -- 2.00 packs/day for 47 years    Types: Cigarettes    Quit date: 11/06/2011  . Smokeless tobacco: Never Used  . Alcohol Use: No  . Drug Use: No  . Sexual Activity: Not on file   Other Topics Concern  . Not on file   Social History Narrative  . No narrative on file    Review of Systems:  CONSTITUTIONAL: No fevers, chills, night sweats, + 30lb weight gain.   EYES: No visual changes or eye pain ENT: No hearing changes.  No history of nose bleeds.   RESPIRATORY: No cough, wheezing and shortness of breath.   CARDIOVASCULAR: Negative for chest pain, and palpitations.   GI: Negative for abdominal discomfort, blood in stools or black stools.  No recent change in bowel habits.   GU:  No history of incontinence.   MUSCLOSKELETAL: No history of joint pain or swelling.  No myalgias.   SKIN: Negative for lesions, rash, and itching.   HEMATOLOGY/ONCOLOGY: Negative for prolonged bleeding, bruising easily, and swollen nodes.     ENDOCRINE: Negative for cold or heat intolerance, polydipsia or goiter.   PSYCH:  +depression or anxiety symptoms.   NEURO: As Above.   Vital Signs:  BP 120/70  Pulse 68  Temp(Src) 97.8 F (36.6 C) (Oral)  Ht 5\' 6"  (1.676 m)  Wt 205 lb (92.987 kg)  BMI 33.10 kg/m2   General Medical Exam:   General:  Well appearing, comfortable.   Eyes/ENT: see cranial nerve examination.   Neck: No masses appreciated.  Full range of motion without tenderness.  Respiratory:  Clear to auscultation, good air entry bilaterally.   Cardiac:  Regular rate and rhythm, no murmur.     Back:  No pain to palpation of spinous processes.   Extremities: Right leg with scar over the knee and several in the lower leg - she has previous fractured her tibia which was complicated by compartment syndrome    Neurological Exam: MENTAL STATUS including orientation to time,  place, person, recent and remote memory, attention span and concentration, language, and fund of knowledge is normal.  Speech sounds strained at times, no dysarthric, nasal or hypophonic. Naming and reading intact.  Slightly more strained with "ta ta and ga ga"  CRANIAL NERVES: II:  No visual field defects.  Unremarkable fundi.   III-IV-VI: Pupils equal round and reactive to light.  Normal conjugate, extra-ocular eye movements in all directions of gaze.  No nystagmus.  No ptosis and there is no worsening with upward gaze.   V:  Normal facial sensation.  Jaw jerk is absent.  VII:  Normal facial symmetry and movements.  Motor strength of oribularis oculi, frontalis, and orbicularis oris is 5/5.  There is mild weakness with buccinator muscles when patient is asked to maintain air in cheeks.  No pathologic facial reflexes.  VIII:  Normal hearing and vestibular function.   IX-X:  Normal palatal movement.   XI:  Normal shoulder shrug and head rotation.   XII:  Subtle tongue weakness and slightly slowed movements.  Normal range of motion, no deviation or fasciculation.  MOTOR:  No atrophy, fasciculations or abnormal movements.  No pronator drift.  Tone is normal.  Mild fatiguability with proximal muscle testing.  Right Upper Extremity:    Left Upper Extremity:    Deltoid  5/5   Deltoid  5/5   Biceps  5/5   Biceps  5/5   Triceps  5/5   Triceps  5/5   Wrist extensors  5/5   Wrist extensors  5/5   Wrist flexors  5/5   Wrist flexors  5/5   Finger extensors  5/5   Finger extensors  5/5   Finger flexors  5/5   Finger flexors  5/5   Dorsal interossei  5/5   Dorsal interossei  5/5   Abductor pollicis  5/5   Abductor pollicis  5/5   Tone (Ashworth scale)  0  Tone (Ashworth scale)  0   Right Lower Extremity:    Left Lower Extremity:    Hip flexors  5/5   Hip flexors  5/5   Hip extensors  5/5   Hip extensors  5/5   Knee flexors  5/5   Knee flexors  5/5   Knee extensors  5/5   Knee extensors  5/5     Dorsiflexors  5/5   Dorsiflexors  5/5   Plantarflexors  5/5   Plantarflexors  5/5   Toe extensors  5/5   Toe extensors  5/5   Toe flexors  5/5   Toe flexors  5/5   Tone (Ashworth scale)  0  Tone (Ashworth scale)  0   MSRs:  Right                                                                 Left brachioradialis 1+  brachioradialis 1+  biceps 1+  biceps 1+  triceps 1+  triceps 1+  patellar 0  patellar 0  ankle jerk 0  ankle jerk 0  Hoffman no  Hoffman no  plantar response down  plantar response down   SENSORY:  Vibration is reduced to 50% at the knees and 30% at the ankles bilaterally.  Prioprioception and pin prick intact bilaterally.   Romberg's sign absent.   COORDINATION/GAIT: Normal finger-to- nose-finger and heel-to-shin.  Intact rapid alternating movements bilaterally.  Able to rise from a chair without using arms.  Gait narrow based and stable.  She is unable to perform tandem and stressed gait intact.    IMPRESSION: 1.  Fluctuating change in voice  - Speech sounds slightly strained/spastic moreso than nasal or hypophonic, but there are no other upper motor neuron findings on exam.  I will check MRI brain to look for neurodegenerative/structural process.    - Given the fluctuating nature of her symptoms, a neuromuscular junction disease such  as myasthenia gravis needs to be evaluated for  - I will check MG panel (binding, blocking, modulating antibodies), CK, and aldolase  - If these are unrevealing, MUSK antibody and EMG will be done  - Lower on the differential is primary progressive aphasia since naming is intact 2.  New onset daily headaches  - Atypical for someone of her age to develop new headaches, so will obtain MRI brain  - In the meantime, I discussed that she is taking abortive medications daily putting her at risk to develop medication overuse and rebound headaches  - I will start her on preventative headache medication with nortriptyline 3.  Hypothyroidism   -  She had TSH checked yesterday and I encouraged her to follow-up with her PCP to be sure levels are within normal limits, since thyroid problems can manifest many different ways, including muscle weakness 4.  Distal and symmetric peripheral neuropathy, ?thyroid related   PLAN/RECOMMENDATIONS:  1.  Myasthenia panel, CK, aldolase 2.  MRI brain wwo contrast 3.  For chronic daily headaches, start notriptyline 10mg  and titrate by 10mg  each week to a goal of 50mg  qhs 4.  Patient to call me in 4 weeks, if she is tolerating the medication, I will send a prescription for 50mg  tablets to take at bedtime 5.  Encouraged her to limit the ibuprofen and tylenol to twice per week to prevent medication overuse headaches 6.  EMG of left side 7.  Return to clinic in 6 weeks  The duration of this appointment visit was 60 minutes of face-to-face time with the patient.  Greater than 50% of this time was spent in counseling, explanation of diagnosis, planning of further management, and coordination of care.   Thank you for allowing me to participate in patient's care.  If I can answer any additional questions, I would be pleased to do so.    Sincerely,    Lititia Sen K. Posey Pronto, DO

## 2013-09-27 NOTE — Patient Instructions (Addendum)
1.  Start notriptyline 10mg  as follows:  Week 1:  10mg  (1 tab) at bedtime  Week 2:  20mg  (2 tab) at bedtime  Week 3:  30mg  (3 tab) at bedtime  Week 4:  40mg  (4 tab) at bedtime 2.  Call me in 4 weeks, if you are tolerating the medication, I will send a prescription for 50mg  tablets to take at bedtime 3.  MRI brain wwo contrast January 30 @10  arrive 15 minutes prior [facility] [phone] 4.  Limit the ibuprofen and tylenol to twice per week 5.  Check blood work today 6.  EMG of left side 7.  Return to clinic in 6 weeks

## 2013-09-29 ENCOUNTER — Telehealth: Payer: Self-pay | Admitting: *Deleted

## 2013-09-29 LAB — ALDOLASE: ALDOLASE: 5 U/L (ref <=8.1)

## 2013-09-29 NOTE — Telephone Encounter (Signed)
Patient was seen. 

## 2013-10-05 ENCOUNTER — Other Ambulatory Visit: Payer: Self-pay | Admitting: *Deleted

## 2013-10-05 DIAGNOSIS — R51 Headache: Principal | ICD-10-CM

## 2013-10-05 DIAGNOSIS — G4486 Cervicogenic headache: Secondary | ICD-10-CM

## 2013-10-05 LAB — MYASTHENIA GRAVIS PANEL 2
Acetylcholine Rec Binding: <0.3 nmol/L
Acetylcholine Rec Mod Ab: 11 %

## 2013-10-06 ENCOUNTER — Ambulatory Visit (HOSPITAL_COMMUNITY)
Admission: RE | Admit: 2013-10-06 | Discharge: 2013-10-06 | Disposition: A | Discharge status: Home / Self Care | Payer: BC Managed Care – PPO | Source: Ambulatory Visit | Attending: Neurology | Admitting: Neurology

## 2013-10-06 DIAGNOSIS — R499 Unspecified voice and resonance disorder: Secondary | ICD-10-CM

## 2013-10-06 DIAGNOSIS — J329 Chronic sinusitis, unspecified: Secondary | ICD-10-CM | POA: Insufficient documentation

## 2013-10-06 DIAGNOSIS — R51 Headache: Secondary | ICD-10-CM

## 2013-10-06 DIAGNOSIS — G4486 Cervicogenic headache: Secondary | ICD-10-CM

## 2013-10-06 LAB — CREATININE, SERUM
Creatinine, Ser: 0.8 mg/dL (ref 0.50–1.10)
GFR calc Af Amer: 88 mL/min — ABNORMAL LOW
GFR calc non Af Amer: 76 mL/min — ABNORMAL LOW

## 2013-10-06 MED ORDER — GADOBENATE DIMEGLUMINE 529 MG/ML IV SOLN
20.0000 mL | Freq: Once | INTRAVENOUS | Status: AC | PRN
  Administered 2013-10-06: 20 mL via INTRAVENOUS

## 2013-10-25 ENCOUNTER — Encounter: Payer: Self-pay | Admitting: Neurology

## 2013-10-25 ENCOUNTER — Ambulatory Visit (INDEPENDENT_AMBULATORY_CARE_PROVIDER_SITE_OTHER): Payer: BC Managed Care – PPO | Admitting: Neurology

## 2013-10-25 ENCOUNTER — Encounter: Payer: BC Managed Care – PPO | Admitting: Neurology

## 2013-10-25 DIAGNOSIS — R499 Unspecified voice and resonance disorder: Secondary | ICD-10-CM

## 2013-10-25 DIAGNOSIS — R498 Other voice and resonance disorders: Secondary | ICD-10-CM

## 2013-10-25 NOTE — Progress Notes (Signed)
See procedure note for EMG results.  Donika K. Patel, DO  

## 2013-10-25 NOTE — Procedures (Signed)
[facility]  [address]  [place], [place] [address] Tel: [phone] Fax:  [phone] Test Date:  10/25/2013  Patient: Leslie Olsen DOB: 01-06-1951 Physician: Narda Amber, DO  Sex: Female Height: 5\' 6"  Ref Phys: Narda Amber  ID#: [id number] Temp: 33.6C Technician:    Patient Complaints: This is a 63 year-old female presenting with hoarse voice and generalized fatigue.  NCV & EMG Findings: Extensive evaluation of the left upper and lower extremity with repetitive nerve stimulation of the median, peroneal and spinal accessory nerve reveals: 1. Normal median and sural sensory responses.  2. Normal median and peroneal motor responses.  There is no abnormal decrement or increment seen on repetitive nerve stimulation of the median, peroneal, and spinal accessory nerves. 3. Sparse chronic motor axon loss changes are seen affecting the first dorsal interosseous muscle. In isolation, this finding is insufficient for definitive diagnosis.  Impression: This is a normal study of the left upper and lower extremity.    In particular, there is no evidence of a neuromuscular junction disorder, myopathy, or cervical/lumbar radiculopathy.   ___________________________ Narda Amber, DO    Nerve Conduction Studies Anti Sensory Summary Table   Site NR Peak (ms) Norm Peak (ms) P-T Amp (V) Norm P-T Amp  Left Median Anti Sensory (2nd Digit)  Wrist    3.7 <3.8 16.7 >10  Left Sural Anti Sensory (Lat Mall)  Calf    3.9 <4.6 8.3 >3   Motor Summary Table   Site NR Onset (ms) Norm Onset (ms) O-P Amp (mV) Norm O-P Amp Site1 Site2 Delta-0 (ms) Dist (cm) Vel (m/s) Norm Vel (m/s)  Left Median Motor (Abd Poll Brev)  Wrist    3.8 <4.0 5.2 >5 Elbow Wrist 4.8 27.0 56 >50  Elbow    8.6  4.7  Post-exercise Elbow 4.8 0.0    Post-exercise    3.8  4.5         Left Peroneal Motor (Ext Dig Brev)  Ankle    3.4 <6.0 4.1 >2.5 B Fib Ankle 7.9 33.0 42 >40  B Fib    11.3  3.2  Poplt B  Fib 1.9 10.0 53 >40  Poplt    13.2  3.1         Left Peroneal TA Motor (Tib Ant)  Fib Head    3.4 <4.5 5.5 >3 Poplit Fib Head 1.7 10.0 59 >40  Poplit    5.1  5.2          EMG   Side Muscle Ins Act Fibs Psw Fasc Number Recrt Dur Dur. Amp Amp. Poly Poly. Comment  Left AntTibialis Nml Nml Nml Nml 1- Mod Nml Nml Nml Nml Nml Nml N/A  Left FlexPolLong Nml Nml Nml Nml Nml Nml Nml Nml Nml Nml Nml Nml N/A  Left GluteusMed Nml Nml Nml Nml Nml Nml Nml Nml Nml Nml Nml Nml N/A  Left 1stDorInt Nml Nml Nml Nml 1- Mod Few 1+ Few 1+ Nml Nml N/A  Left BrachioRad Nml Nml Nml Nml Nml Nml Nml Nml Nml Nml Nml Nml N/A  Left PronatorTeres Nml Nml Nml Nml Nml Nml Nml Nml Nml Nml Nml Nml N/A  Left Biceps Nml Nml Nml Nml Nml Nml Nml Nml Nml Nml Nml Nml N/A  Left Triceps Nml Nml Nml Nml Nml Nml Nml Nml Nml Nml Nml Nml N/A  Left Deltoid Nml Nml Nml Nml Nml Nml Nml Nml Nml Nml Nml Nml N/A  Left Gastroc Nml Nml Nml Nml  Nml Nml Nml Nml Nml Nml Nml Nml N/A  Left RectFemoris Nml Nml Nml Nml Nml Nml Nml Nml Nml Nml Nml Nml N/A  Left PostTibialis Nml Nml Nml Nml Nml Nml Nml Nml Nml Nml Nml Nml N/A   RNS   Trial # Label Amp 1 (mV)  O-P Amp 5 (mV)  O-P Amp % Dif Area 1 (mVms) Area 5 (mVms) Area % Dif Rep Rate Train Length Pause Time (min:sec) Comments  Left Abd Poll Brev  Tr 1 Baseline 4.34 4.37 0.8 18.69 18.13 -3.0 3.00 10 00:30   Tr 2 Post Exercise 4.76 4.54 -4.7 17.88 17.99 0.6 3.00 10 01:00   Tr 3 1 Min Post 4.29 4.34 1.2 18.12 17.71 -2.3 3.00 10 01:00   Tr 4 2 Min Post 4.26 4.27 0.4 18.19 17.78 -2.3 3.00 10 01:00   Tr 5 3 Min Post       3.00 10 00:00   Left AntTibialis  Tr 1 Baseline 5.40 5.40 0.0 29.65 29.07 -2.0 3.00 10 00:30   Tr 2 Post Exercise 5.62 5.39 -4.1 30.74 29.38 -4.4 3.00 10 01:00   Tr 3 1 Min Post 5.30 5.26 -0.8 29.54 28.98 -1.9 3.00 10 01:00   Tr 4 2 Min Post 5.32 5.21 -1.9 29.73 28.81 -3.1 3.00 10 01:00   Tr 5 3 Min Post       3.00 10 00:00   Left Trapezius  Tr 1 Baseline 6.09 6.37 4.5 36.56  37.15 1.6 3.00 10 00:30   Tr 2 Post Exercise 6.53 6.52 -0.2 39.40 38.14 -3.2 3.00 10 01:00   Tr 3 1 Min Post 6.41 6.35 -0.9 38.87 37.75 -2.9 3.00 10 01:00   Tr 4 2 Min Post 6.41 6.59 2.7 39.43 39.20 -0.6 3.00 10 00:00   Tr 5 3 Min Post 6.59 6.71 1.9 40.40 40.25 -0.4 3.00 10 00:00            Waveforms:

## 2013-10-30 ENCOUNTER — Telehealth: Payer: Self-pay | Admitting: Neurology

## 2013-10-30 MED ORDER — PYRIDOSTIGMINE BROMIDE 60 MG PO TABS: 60.0000 mg | ORAL_TABLET | Freq: Every day | ORAL | Status: DC

## 2013-10-30 NOTE — Telephone Encounter (Signed)
EMG without evidence of NMJ disorder and AChR binding antibodies are negative. Given fluctuating symptoms, I would like to offer a trial of mestinon 60mg  daily, which she is agreeable with.    Nuel Dejaynes K. Posey Pronto, DO

## 2013-11-02 ENCOUNTER — Ambulatory Visit: Payer: BC Managed Care – PPO | Admitting: Neurology

## 2013-12-01 ENCOUNTER — Ambulatory Visit: Payer: BC Managed Care – PPO | Admitting: Neurology

## 2014-01-09 ENCOUNTER — Ambulatory Visit (INDEPENDENT_AMBULATORY_CARE_PROVIDER_SITE_OTHER): Payer: BC Managed Care – PPO | Admitting: Pulmonary Disease

## 2014-01-09 ENCOUNTER — Encounter: Payer: Self-pay | Admitting: Pulmonary Disease

## 2014-01-09 VITALS — BP 140/78 | HR 91 | Ht 66.0 in | Wt 205.0 lb

## 2014-01-09 DIAGNOSIS — J449 Chronic obstructive pulmonary disease, unspecified: Secondary | ICD-10-CM

## 2014-01-09 DIAGNOSIS — R911 Solitary pulmonary nodule: Secondary | ICD-10-CM

## 2014-01-09 DIAGNOSIS — R918 Other nonspecific abnormal finding of lung field: Secondary | ICD-10-CM

## 2014-01-09 DIAGNOSIS — J309 Allergic rhinitis, unspecified: Secondary | ICD-10-CM

## 2014-01-09 NOTE — Patient Instructions (Signed)
We will order a CT chest for 08/2014 to look at pulmonary nodules  For the sinuses: Use Neil Med rinses with distilled water at least twice per day using the instructions on the package. 1/2 hour after using the [facility] Med rinse, use Nasacort two puffs in each nostril once per day. Use cetirizine daily  Keep using the Symbicort as your are doing  We will see you back in December after your CT scan

## 2014-01-09 NOTE — Assessment & Plan Note (Signed)
Start Nasacort, Saline rinses, and Cetirizine

## 2014-01-09 NOTE — Progress Notes (Signed)
Subjective:    Patient ID: Leslie Olsen, female    DOB: October 21, 1950, 63 y.o.   MRN: [id number]  Synopsis: This is a 63 year old female with GOLD grade C COPD who first saw the [facility] pulmonary clinic in early 2014 for evaluation of the same. She was hospitalized for an exacerbation of COPD in March 2013, October 2013. She also has obstructive sleep apnea and uses CPAP. She had been intolerant of Roflumilast as it caused diarrhea. She previously smoked 2 packs of cigarettes daily for 30 years and quit in March of 2013. 11/01/2012 simple spirometry ratio 73%, FEV1 1.94% (76% predicted) flow volume loop consistent with obstruction 02/07/2013 Simple spirometry > ratio 71%, FEV1 2.1 L (83% predicted)   HPI   01/24/2013 ROV >> Leslie Olsen has been doing well since her last visit. She attributes most of this to starting CPAP. She says that she is now sleeping throughout the night and she feels great in the morning. She has significantly more energy throughout the day. She uses her CPAP machine every night. She states that she still has some hoarseness and recently saw ear nose and throat thought it was related to gastroesophageal reflux disease. She experiences previously to changing to [place] but states that since then she has not experienced a change in the hoarseness or the shortness of breath. She is also had some headache since starting the Spiriva. She continues to use the Symbicort twice a day. She has some shortness of breath on exertion but in general is much better than previously.  02/07/2013 ROV >> Leslie Olsen states that since the last visit she has been feeling exhausted and still has hoarseness. She doesn't feel that her throat has been normal for several months. She states that about 10 days ago she was exposed to an of them cleaner at work and ever since then she's had increasing shortness of breath and hoarseness. This is been associated with increasing wheezing. She states that the use of  albuterol has not helped. She's used it some. She is still using the Symbicort with a spacer. She is rinsing her mouth out afterwards. She's not using Spiriva. She recently started using an allergy pill but stated that it did not help with the hoarseness. She has been exposed to a lot of mold and mildew at work. She continues to use CPAP and states that she has dry mouth after using it.  08/25/2013 ROV > Leslie Olsen's voice is markedly different than last time.  She feels like she is choking more. This has been persistant for a while now.  No change in cough lately.  In fact she rarely coughs.  She can feel some wheezing on occasion.  She never lies flat so unknown if orthopnea.  She has been using the CPAP machine regularly which helps with quality of sleep at night which is still OK.  It feels like something is choking her all the time.  Some days are worse than others.  She has been taking nexium and chlortrimeton which has helped.  She has been using symbicort regularly and has used proAir.  ProAir helps.  She has had a lot of headache lately.    01/09/2014 ROV> Leslie Olsen's voice is better than before and she has been taking a medicine for myasthenia prescribed by Dr. Posey Pronto.  She has been a lot of sinus congestion which makes her cough up more mucus nowdays. She is a little more short of breath.  No fevers or chills but she is tired  more.  She is about to start picking up effort at work with more cleaning.    Past Medical History  Diagnosis Date  . COPD (chronic obstructive pulmonary disease)   . GERD (gastroesophageal reflux disease)   . Hypothyroidism      Review of Systems  Constitutional: Negative for fever, chills and fatigue.  HENT: Positive for postnasal drip, rhinorrhea and sinus pressure. Negative for congestion, nosebleeds and voice change.   Respiratory: Negative for cough, shortness of breath and wheezing.   Cardiovascular: Negative for chest pain, palpitations and leg swelling.        Objective:   Physical Exam   Filed Vitals:   01/09/14 0913  BP: 140/78  Pulse: 91  Height: 5\' 6"  (1.676 m)  Weight: 205 lb (92.987 kg)  SpO2: 97%   RA  Gen: well appearing, no acute distress HEENT: NCAT, EOMi, OP clear,  PULM: CTA B CV: RRR, slight systolic murmur, no JVD AB: BS+, soft, nontender, no hsm Ext: warm, no edema, no clubbing, no cyanosis Neuro: speech is clear and fluent today      Assessment & Plan:   COPD (chronic obstructive pulmonary disease) Stable interval  Continue Symbicort  Multiple pulmonary nodules Repeat CT chest in 08/2014 Would then start annual screening in 2016  Allergic rhinitis Start Nasacort, Saline rinses, and Cetirizine    Updated Medication List Outpatient Encounter Prescriptions as of 01/09/2014  Medication Sig  . albuterol (PROVENTIL HFA;VENTOLIN HFA) 108 (90 BASE) MCG/ACT inhaler Inhale 2 puffs into the lungs every 6 (six) hours as needed for wheezing.  Marland Kitchen aspirin 81 MG tablet Take 81 mg by mouth daily.  . budesonide-formoterol (SYMBICORT) 160-4.5 MCG/ACT inhaler Inhale 2 puffs into the lungs 2 (two) times daily.  Marland Kitchen esomeprazole (NEXIUM) 20 MG capsule Take 20 mg by mouth daily before breakfast.  . levothyroxine (SYNTHROID, LEVOTHROID) 88 MCG tablet Take 112 mcg by mouth daily.   . Omega-3 Fatty Acids (FISH OIL) 1200 MG CAPS Take 1 capsule by mouth daily.  Marland Kitchen pyridostigmine (MESTINON) 60 MG tablet Take 1 tablet (60 mg total) by mouth daily.  . Red Yeast Rice 600 MG CAPS Take 2 capsules by mouth daily.  . [DISCONTINUED] nortriptyline (PAMELOR) 10 MG capsule Take one tablet at bedtime, increase by 10mg  each week to a goal of 50mg  daily.

## 2014-01-09 NOTE — Assessment & Plan Note (Signed)
Repeat CT chest in 08/2014 Would then start annual screening in 2016

## 2014-01-09 NOTE — Assessment & Plan Note (Signed)
Stable interval  Continue Symbicort

## 2014-02-25 ENCOUNTER — Other Ambulatory Visit: Payer: Self-pay | Admitting: Internal Medicine

## 2014-04-24 ENCOUNTER — Other Ambulatory Visit (HOSPITAL_COMMUNITY): Payer: Self-pay | Admitting: *Deleted

## 2014-04-24 ENCOUNTER — Other Ambulatory Visit: Payer: Self-pay

## 2014-04-24 ENCOUNTER — Other Ambulatory Visit (INDEPENDENT_AMBULATORY_CARE_PROVIDER_SITE_OTHER): Payer: BC Managed Care – PPO

## 2014-04-24 DIAGNOSIS — R0602 Shortness of breath: Secondary | ICD-10-CM

## 2014-05-08 ENCOUNTER — Other Ambulatory Visit: Payer: BC Managed Care – PPO

## 2014-05-23 ENCOUNTER — Encounter: Payer: Self-pay | Admitting: Pulmonary Disease

## 2014-05-23 ENCOUNTER — Ambulatory Visit (INDEPENDENT_AMBULATORY_CARE_PROVIDER_SITE_OTHER)
Admission: RE | Admit: 2014-05-23 | Discharge: 2014-05-23 | Disposition: A | Discharge status: Home / Self Care | Payer: BC Managed Care – PPO | Source: Ambulatory Visit | Attending: Pulmonary Disease | Admitting: Pulmonary Disease

## 2014-05-23 ENCOUNTER — Ambulatory Visit (INDEPENDENT_AMBULATORY_CARE_PROVIDER_SITE_OTHER): Payer: BC Managed Care – PPO | Admitting: Pulmonary Disease

## 2014-05-23 VITALS — BP 132/70 | HR 82 | Temp 98.2°F | Ht 66.0 in | Wt 203.0 lb

## 2014-05-23 DIAGNOSIS — Z9989 Dependence on other enabling machines and devices: Secondary | ICD-10-CM

## 2014-05-23 DIAGNOSIS — D649 Anemia, unspecified: Secondary | ICD-10-CM | POA: Insufficient documentation

## 2014-05-23 DIAGNOSIS — R0602 Shortness of breath: Secondary | ICD-10-CM

## 2014-05-23 DIAGNOSIS — G4733 Obstructive sleep apnea (adult) (pediatric): Secondary | ICD-10-CM

## 2014-05-23 DIAGNOSIS — D509 Iron deficiency anemia, unspecified: Secondary | ICD-10-CM

## 2014-05-23 DIAGNOSIS — J438 Other emphysema: Secondary | ICD-10-CM

## 2014-05-23 NOTE — Assessment & Plan Note (Signed)
Her compliance is excellent, will request download compliance report.

## 2014-05-23 NOTE — Assessment & Plan Note (Signed)
Apparently she has been found to have Fe def anemia and is to undergo an endoscopy soon.    Plan: -continue Fe -f/u with endoscopy -If she sees me before her PCP I'll order a cbc after the endoscopy

## 2014-05-23 NOTE — Assessment & Plan Note (Signed)
I don't think that her fatigue is related to her COPD because her dyspnea is at baseline and she is not coughing more than normal.    Plan: -maintain current therapy -flu shot today

## 2014-05-23 NOTE — Progress Notes (Signed)
Subjective:    Patient ID: Leslie Olsen, female    DOB: [birth year], 63 y.o.   MRN: [id number]  Synopsis: This is a 63 year old female with GOLD grade C COPD who first saw the [facility] pulmonary clinic in early 2014 for evaluation of the same. She was hospitalized for an exacerbation of COPD in March 2013, October 2013. She also has obstructive sleep apnea and uses CPAP. She had been intolerant of Roflumilast as it caused diarrhea. She previously smoked 2 packs of cigarettes daily for 30 years and quit in March of 2013. 11/01/2012 simple spirometry ratio 73%, FEV1 1.94% (76% predicted) flow volume loop consistent with obstruction 02/07/2013 Simple spirometry > ratio 71%, FEV1 2.1 L (83% predicted)   HPI  05/23/2014 ROV > Leslie Olsen has been feeling fatigued for the last few months and has been found to be anemic so she was started on iron supplementation.  She feels pain in her right rib area as well.  She has been scheduled for an endoscopy in about two weeks.  She has described the pain in her right ribs as a pressure that shouldn't be there.  It is constant.  Nothing makes it better or worse.  She coughs some, it is dry, no mucus production.  She is not really short of breath, just fatigued.   The fatigue is severe and not improved with resting or sleep. She is struggling to do work now due to her fatigue.      Past Medical History  Diagnosis Date  . COPD (chronic obstructive pulmonary disease)   . GERD (gastroesophageal reflux disease)   . Hypothyroidism      Review of Systems  Constitutional: Positive for fatigue. Negative for fever and chills.  HENT: Negative for congestion, nosebleeds, postnasal drip, rhinorrhea, sinus pressure and voice change.   Respiratory: Negative for cough, shortness of breath and wheezing.   Cardiovascular: Negative for chest pain, palpitations and leg swelling.       Objective:   Physical Exam   Filed Vitals:   05/23/14 1459  BP: 132/70  Pulse:  82  Temp: 98.2 F (36.8 C)  TempSrc: Oral  Height: 5\' 6"  (1.676 m)  Weight: 203 lb (92.08 kg)  SpO2: 98%   RA  Gen: well appearing, no acute distress HEENT: NCAT, EOMi, OP clear,  PULM: CTA B CV: RRR, slight systolic murmur, no JVD AB: BS+, soft, nontender, no hsm Ext: warm, no edema, no clubbing, no cyanosis Neuro: speech is clear and fluent today      Assessment & Plan:   COPD (chronic obstructive pulmonary disease) I don't think that her fatigue is related to her COPD because her dyspnea is at baseline and she is not coughing more than normal.    Plan: -maintain current therapy -flu shot today  OSA on CPAP Her compliance is excellent, will request download compliance report.  Anemia Apparently she has been found to have Fe def anemia and is to undergo an endoscopy soon.    Plan: -continue Fe -f/u with endoscopy -If she sees me before her PCP I'll order a cbc after the endoscopy    Updated Medication List Outpatient Encounter Prescriptions as of 05/23/2014  Medication Sig  . albuterol (PROVENTIL HFA;VENTOLIN HFA) 108 (90 BASE) MCG/ACT inhaler Inhale 2 puffs into the lungs every 6 (six) hours as needed for wheezing.  Marland Kitchen aspirin 81 MG tablet Take 81 mg by mouth daily.  Marland Kitchen esomeprazole (NEXIUM) 20 MG capsule Take 20 mg by mouth daily  before breakfast.  . ferrous fumarate (HEMOCYTE - 106 MG FE) 325 (106 FE) MG TABS tablet Take 1 tablet by mouth daily.  Marland Kitchen levothyroxine (SYNTHROID, LEVOTHROID) 88 MCG tablet Take 100 mcg by mouth daily.   . Omega-3 Fatty Acids (FISH OIL) 1200 MG CAPS Take 1 capsule by mouth daily.  Marland Kitchen pyridostigmine (MESTINON) 60 MG tablet Take 1 tablet (60 mg total) by mouth daily.  . Red Yeast Rice 600 MG CAPS Take 2 capsules by mouth daily.  . SYMBICORT 160-4.5 MCG/ACT inhaler INHALE TWO PUFFS INTO THE LUNGS TWICE DAILY

## 2014-05-23 NOTE — Patient Instructions (Signed)
We will arrange a chest x-ray at [facility] and call you with the results We will see you back in a few weeks after your endoscopy (6 weeks from now)

## 2014-05-25 ENCOUNTER — Telehealth: Payer: Self-pay

## 2014-05-25 NOTE — Telephone Encounter (Signed)
Left message with [facility] to have cpap download faxed to our office, and to enroll in airview if cpap is compatible.

## 2014-05-25 NOTE — Telephone Encounter (Signed)
Message copied by Len Blalock on Fri May 25, 2014  3:19 PM ------      Message from: Simonne Maffucci B      Created: Wed May 23, 2014  8:47 PM       A,            Can we request a download?            Thanks      B ------

## 2014-05-25 NOTE — Progress Notes (Signed)
Quick Note:  Spoke with pt, she is aware of results. Nothing further needed. ______ 

## 2014-05-28 NOTE — Telephone Encounter (Signed)
Spoke with Anderson Malta from [facility], pt is bringing in machine tomorrow (9/22) to have download and this will be faxed to [place] once [facility] has it.  She has a [facility] 9 and is unable to be enrolled in [place].  Will place download in BQ's look-at tomorrow as I receive it.  Nothing further needed at this time, just forwarding to BQ as an fyi.

## 2014-05-28 NOTE — Telephone Encounter (Signed)
lmtcb X2 with [facility] to have download faxed to office and enrolled in airview if compatible.

## 2014-06-03 ENCOUNTER — Encounter: Payer: Self-pay | Admitting: Pulmonary Disease

## 2014-06-05 ENCOUNTER — Ambulatory Visit: Payer: Self-pay | Admitting: Gastroenterology

## 2014-06-08 LAB — PATHOLOGY REPORT

## 2014-06-20 ENCOUNTER — Telehealth: Payer: Self-pay

## 2014-06-20 NOTE — Telephone Encounter (Signed)
Pt is aware of results.  She had endoscopy and colonoscopy on 9/29 and pcp has sent her to hematologist (so it sounds like per her description) for labs.  Nothing further needed, just fyi.

## 2014-06-20 NOTE — Telephone Encounter (Signed)
Message copied by Len Blalock on Wed Jun 20, 2014  1:18 PM ------      Message from: Juanito Doom      Created: Mon Jun 18, 2014  9:25 PM       Caryl Pina,            Her last CBC (from June 14 2014) showed significant microcytic anemia. Please call her to make sure that she has had her endoscopy. It also showed a number of lab abnormalities (thyroid problems, blood glucose problems). I believe that these labs were collected by her primary care physician. Please make sure that she has seen her primary care physician recently.            Thanks,      Ruby Cola ------

## 2014-06-26 ENCOUNTER — Ambulatory Visit: Payer: Self-pay | Admitting: Surgery

## 2014-06-26 LAB — CBC WITH DIFFERENTIAL/PLATELET
BASOS PCT: 1.2 %
Basophil #: 0.1 10*3/uL (ref 0.0–0.1)
EOS ABS: 0.2 10*3/uL (ref 0.0–0.7)
EOS PCT: 3.9 %
HCT: 29.5 % — AB (ref 35.0–47.0)
HGB: 9 g/dL — ABNORMAL LOW (ref 12.0–16.0)
LYMPHS PCT: 37.3 %
Lymphocyte #: 2 10*3/uL (ref 1.0–3.6)
MCH: 22.6 pg — AB (ref 26.0–34.0)
MCHC: 30.6 g/dL — AB (ref 32.0–36.0)
MCV: 74 fL — ABNORMAL LOW (ref 80–100)
MONO ABS: 0.4 x10 3/mm (ref 0.2–0.9)
Monocyte %: 6.8 %
NEUTROS ABS: 2.8 10*3/uL (ref 1.4–6.5)
NEUTROS PCT: 50.8 %
PLATELETS: 308 10*3/uL (ref 150–440)
RBC: 4 10*6/uL (ref 3.80–5.20)
RDW: 17.3 % — AB (ref 11.5–14.5)
WBC: 5.5 10*3/uL (ref 3.6–11.0)

## 2014-07-03 ENCOUNTER — Ambulatory Visit: Payer: Self-pay | Admitting: Urgent Care

## 2014-07-03 LAB — CREATININE, SERUM
CREATININE: 0.99 mg/dL (ref 0.60–1.30)
EGFR (African American): >60
EGFR (Non-African Amer.): 60 — ABNORMAL LOW

## 2014-07-04 ENCOUNTER — Ambulatory Visit: Payer: Self-pay

## 2014-07-04 LAB — CBC CANCER CENTER
BASOS PCT: 1.3 %
Basophil #: 0.1 x10 3/mm (ref 0.0–0.1)
EOS PCT: 2.8 %
Eosinophil #: 0.2 x10 3/mm (ref 0.0–0.7)
HCT: 33.5 % — ABNORMAL LOW (ref 35.0–47.0)
HGB: 10.2 g/dL — AB (ref 12.0–16.0)
Lymphocyte #: 2.1 x10 3/mm (ref 1.0–3.6)
Lymphocyte %: 34.7 %
MCH: 22.3 pg — AB (ref 26.0–34.0)
MCHC: 30.4 g/dL — ABNORMAL LOW (ref 32.0–36.0)
MCV: 73 fL — AB (ref 80–100)
Monocyte #: 0.4 x10 3/mm (ref 0.2–0.9)
Monocyte %: 7.3 %
NEUTROS PCT: 53.9 %
Neutrophil #: 3.2 x10 3/mm (ref 1.4–6.5)
PLATELETS: 331 x10 3/mm (ref 150–440)
RBC: 4.58 10*6/uL (ref 3.80–5.20)
RDW: 17.2 % — AB (ref 11.5–14.5)
WBC: 5.9 x10 3/mm (ref 3.6–11.0)

## 2014-07-04 LAB — IRON AND TIBC
IRON: 39 ug/dL — AB (ref 50–170)
Iron Bind.Cap.(Total): 488 ug/dL — ABNORMAL HIGH (ref 250–450)
Iron Saturation: 8 %
Unbound Iron-Bind.Cap.: 449 ug/dL

## 2014-07-04 LAB — FERRITIN: FERRITIN (ARMC): 7 ng/mL — AB (ref 8–388)

## 2014-07-08 ENCOUNTER — Ambulatory Visit: Payer: Self-pay

## 2014-07-10 ENCOUNTER — Ambulatory Visit: Payer: Self-pay | Admitting: Urgent Care

## 2014-08-07 ENCOUNTER — Ambulatory Visit: Payer: Self-pay | Admitting: Hematology and Oncology

## 2014-08-13 ENCOUNTER — Ambulatory Visit: Payer: Self-pay | Admitting: Family Medicine

## 2014-08-16 LAB — CBC CANCER CENTER
Basophil #: 0.1 x10 3/mm (ref 0.0–0.1)
Basophil %: 1.4 %
EOS ABS: 0.2 x10 3/mm (ref 0.0–0.7)
Eosinophil %: 4 %
HCT: 34.7 % — ABNORMAL LOW (ref 35.0–47.0)
HGB: 11.1 g/dL — AB (ref 12.0–16.0)
LYMPHS ABS: 2.2 x10 3/mm (ref 1.0–3.6)
Lymphocyte %: 40.2 %
MCH: 27.3 pg (ref 26.0–34.0)
MCHC: 32 g/dL (ref 32.0–36.0)
MCV: 85 fL (ref 80–100)
MONOS PCT: 6.9 %
Monocyte #: 0.4 x10 3/mm (ref 0.2–0.9)
NEUTROS PCT: 47.5 %
Neutrophil #: 2.6 x10 3/mm (ref 1.4–6.5)
Platelet: 259 x10 3/mm (ref 150–440)
RBC: 4.07 10*6/uL (ref 3.80–5.20)
RDW: 27.9 % — AB (ref 11.5–14.5)
WBC: 5.5 x10 3/mm (ref 3.6–11.0)

## 2014-08-16 LAB — FERRITIN: Ferritin (ARMC): 65 ng/mL (ref 8–388)

## 2014-08-16 LAB — IRON AND TIBC
IRON SATURATION: 34 %
IRON: 96 ug/dL (ref 50–170)
Iron Bind.Cap.(Total): 281 ug/dL (ref 250–450)
Unbound Iron-Bind.Cap.: 185 ug/dL

## 2014-08-23 ENCOUNTER — Ambulatory Visit: Payer: Self-pay | Admitting: Pulmonary Disease

## 2014-08-29 ENCOUNTER — Telehealth: Payer: Self-pay

## 2014-08-29 NOTE — Telephone Encounter (Signed)
Pt aware of results.  rov made as one wasn't scheduled after last ov.  Nothing further needed.

## 2014-08-29 NOTE — Telephone Encounter (Signed)
lmtcb X1 for pt  

## 2014-08-29 NOTE — Telephone Encounter (Signed)
-----   Message from Juanito Doom, MD sent at 08/28/2014  6:12 PM EST ----- A, Please let her know that her CT chest showed no change in the nodules which is good.  Thanks B

## 2014-09-03 ENCOUNTER — Ambulatory Visit: Payer: Self-pay | Admitting: Family Medicine

## 2014-09-07 ENCOUNTER — Ambulatory Visit: Payer: Self-pay | Admitting: Hematology and Oncology

## 2014-10-11 ENCOUNTER — Encounter: Payer: Self-pay | Admitting: Pulmonary Disease

## 2014-10-11 ENCOUNTER — Ambulatory Visit: Payer: Self-pay | Admitting: Hematology and Oncology

## 2014-10-11 ENCOUNTER — Encounter (INDEPENDENT_AMBULATORY_CARE_PROVIDER_SITE_OTHER): Payer: Self-pay

## 2014-10-11 ENCOUNTER — Ambulatory Visit: Payer: BC Managed Care – PPO | Admitting: Pulmonary Disease

## 2014-10-11 ENCOUNTER — Ambulatory Visit (INDEPENDENT_AMBULATORY_CARE_PROVIDER_SITE_OTHER): Payer: BLUE CROSS/BLUE SHIELD | Admitting: Pulmonary Disease

## 2014-10-11 VITALS — BP 154/78 | HR 80 | Ht 66.0 in | Wt 204.0 lb

## 2014-10-11 DIAGNOSIS — G4733 Obstructive sleep apnea (adult) (pediatric): Secondary | ICD-10-CM

## 2014-10-11 DIAGNOSIS — Z9989 Dependence on other enabling machines and devices: Secondary | ICD-10-CM

## 2014-10-11 DIAGNOSIS — T733XXD Exhaustion due to excessive exertion, subsequent encounter: Secondary | ICD-10-CM

## 2014-10-11 DIAGNOSIS — J438 Other emphysema: Secondary | ICD-10-CM

## 2014-10-11 DIAGNOSIS — R918 Other nonspecific abnormal finding of lung field: Secondary | ICD-10-CM

## 2014-10-11 LAB — CBC CANCER CENTER
BASOS PCT: 1 %
Basophil #: 0.1 x10 3/mm (ref 0.0–0.1)
Eosinophil #: 0.2 x10 3/mm (ref 0.0–0.7)
Eosinophil %: 3.6 %
HCT: 36.9 % (ref 35.0–47.0)
HGB: 12.4 g/dL (ref 12.0–16.0)
LYMPHS PCT: 36.5 %
Lymphocyte #: 2.5 x10 3/mm (ref 1.0–3.6)
MCH: 31 pg (ref 26.0–34.0)
MCHC: 33.7 g/dL (ref 32.0–36.0)
MCV: 92 fL (ref 80–100)
MONOS PCT: 7.6 %
Monocyte #: 0.5 x10 3/mm (ref 0.2–0.9)
NEUTROS ABS: 3.5 x10 3/mm (ref 1.4–6.5)
NEUTROS PCT: 51.3 %
Platelet: 258 x10 3/mm (ref 150–440)
RBC: 4.01 10*6/uL (ref 3.80–5.20)
RDW: 13.3 % (ref 11.5–14.5)
WBC: 6.9 x10 3/mm (ref 3.6–11.0)

## 2014-10-11 LAB — IRON AND TIBC
IRON BIND. CAP.(TOTAL): 364 ug/dL (ref 250–450)
Iron Saturation: 16 %
Iron: 60 ug/dL (ref 50–170)
UNBOUND IRON-BIND. CAP.: 304 ug/dL

## 2014-10-11 LAB — FERRITIN: Ferritin (ARMC): 24 ng/mL (ref 8–388)

## 2014-10-11 NOTE — Assessment & Plan Note (Signed)
This has been a stable interval for Revia. She only has mild COPD and today on exam her lungs sound normal. Her oximetry is normal. I explained to her today that I do not think that her lung disease is the cause of her generalized fatigue. We will check an O2 saturation at night to see if she desaturates despite using CPAP, but otherwise she should continue taking her Symbicort for her mild COPD.

## 2014-10-11 NOTE — Progress Notes (Signed)
Subjective:    Patient ID: Leslie Olsen, female    DOB: July 18, 1951, 64 y.o.   MRN: [id number]  Synopsis: This is a 64 year old female with GOLD grade C COPD who first saw the [facility] pulmonary clinic in early 2014 for evaluation of the same. She was hospitalized for an exacerbation of COPD in March 2013, October 2013. She also has obstructive sleep apnea and uses CPAP. She had been intolerant of Roflumilast as it caused diarrhea. She previously smoked 2 packs of cigarettes daily for 30 years and quit in March of 2013. 11/01/2012 simple spirometry ratio 73%, FEV1 1.94% (76% predicted) flow volume loop consistent with obstruction 02/07/2013 Simple spirometry > ratio 71%, FEV1 2.1 L (83% predicted)   HPI Chief Complaint  Patient presents with  . Follow-up    pt c/o worsening sob, some days worse than others.  also c/o chest tightness with the sob.     Leslie Olsen says that she has been struggling with fatigue recently and some dyspnea.  She says that her hematocrit has been OK but apparently her Ferritin is quite low.  She says that her fatigue initially went away when she was treated with Fe infusions back in the Spring.  She says that the fatigue she has been experiencing feels like the anemia from before.  She says that she has been feeling more short of breath with exertion.  Apparently she had been started on thyroid medication and her TSH was fine last week.  She has undergone an ultrasound of her neck and was told it was just a lipoma.  Her mammogram was normal this year.    Past Medical History  Diagnosis Date  . COPD (chronic obstructive pulmonary disease)   . GERD (gastroesophageal reflux disease)   . Hypothyroidism      Review of Systems  Constitutional: Positive for fatigue. Negative for fever and chills.  HENT: Negative for congestion, nosebleeds, postnasal drip, rhinorrhea, sinus pressure and voice change.   Respiratory: Negative for cough, shortness of breath and  wheezing.   Cardiovascular: Negative for chest pain, palpitations and leg swelling.       Objective:   Physical Exam  Filed Vitals:   10/11/14 1647  BP: 154/78  Pulse: 80  Height: 5\' 6"  (1.676 m)  Weight: 204 lb (92.534 kg)  SpO2: 99%   RA  Gen: well appearing, no acute distress HEENT: NCAT, EOMi, OP clear,  PULM: CTA B CV: RRR, slight systolic murmur, no JVD AB: BS+, soft, nontender, no hsm Ext: warm, no edema, no clubbing, no cyanosis Neuro: speech is clear and fluent today      Assessment & Plan:   COPD (chronic obstructive pulmonary disease) This has been a stable interval for Leslie Olsen. She only has mild COPD and today on exam her lungs sound normal. Her oximetry is normal. I explained to her today that I do not think that her lung disease is the cause of her generalized fatigue. We will check an O2 saturation at night to see if she desaturates despite using CPAP, but otherwise she should continue taking her Symbicort for her mild COPD.   Multiple pulmonary nodules 12 2015 CT chest showed no change in nodules. This completes 2 years of surveillance CT scans. No further scans are needed at this time.   OSA on CPAP Continue using CPAP at night. Her most recent download showed excellent compliance. As noted above, because of her fatigue I will check an overnight oximetry test on CPAP to  see if she has desaturation.     Updated Medication List Outpatient Encounter Prescriptions as of 10/11/2014  Medication Sig  . albuterol (PROVENTIL HFA;VENTOLIN HFA) 108 (90 BASE) MCG/ACT inhaler Inhale 2 puffs into the lungs every 6 (six) hours as needed for wheezing.  Marland Kitchen aspirin 81 MG tablet Take 81 mg by mouth daily.  Marland Kitchen esomeprazole (NEXIUM) 20 MG capsule Take 20 mg by mouth daily before breakfast.  . ferrous fumarate (HEMOCYTE - 106 MG FE) 325 (106 FE) MG TABS tablet Take 1 tablet by mouth daily.  Marland Kitchen levothyroxine (SYNTHROID, LEVOTHROID) 88 MCG tablet Take 88 mcg by mouth daily.   .  Omega-3 Fatty Acids (FISH OIL) 1200 MG CAPS Take 1 capsule by mouth daily.  . Red Yeast Rice 600 MG CAPS Take 2 capsules by mouth daily.  . SYMBICORT 160-4.5 MCG/ACT inhaler INHALE TWO PUFFS INTO THE LUNGS TWICE DAILY  . [DISCONTINUED] pyridostigmine (MESTINON) 60 MG tablet Take 1 tablet (60 mg total) by mouth daily.

## 2014-10-11 NOTE — Assessment & Plan Note (Signed)
Continue using CPAP at night. Her most recent download showed excellent compliance. As noted above, because of her fatigue I will check an overnight oximetry test on CPAP to see if she has desaturation.

## 2014-10-11 NOTE — Assessment & Plan Note (Signed)
12 2015 CT chest showed no change in nodules. This completes 2 years of surveillance CT scans. No further scans are needed at this time.

## 2014-10-11 NOTE — Patient Instructions (Signed)
Keep taking your medication as you are doing We will arrange for a overnight oximetry test, wear your CPAP the night of the test We will see you back in 6 months or sooner if needed

## 2014-10-12 ENCOUNTER — Other Ambulatory Visit: Payer: Self-pay | Admitting: Pulmonary Disease

## 2014-10-12 DIAGNOSIS — G4733 Obstructive sleep apnea (adult) (pediatric): Secondary | ICD-10-CM

## 2014-10-12 DIAGNOSIS — Z9989 Dependence on other enabling machines and devices: Principal | ICD-10-CM

## 2014-11-06 ENCOUNTER — Ambulatory Visit
Admit: 2014-11-06 | Disposition: A | Discharge status: Home / Self Care | Payer: Self-pay | Attending: Hematology and Oncology | Admitting: Hematology and Oncology

## 2014-11-29 ENCOUNTER — Telehealth: Payer: Self-pay | Admitting: Pulmonary Disease

## 2014-11-29 DIAGNOSIS — G4733 Obstructive sleep apnea (adult) (pediatric): Secondary | ICD-10-CM

## 2014-11-29 DIAGNOSIS — Z9989 Dependence on other enabling machines and devices: Principal | ICD-10-CM

## 2014-11-29 NOTE — Telephone Encounter (Signed)
Spoke with the pt  She states that she was never contacted regarding ONO on CPAP  It was ordered on 10/12/14 DME is [facility] in [place]  Will forward to [facility] to get this set up  Thanks!

## 2014-12-03 NOTE — Telephone Encounter (Signed)
Order was faxed back on 10/12/14 to [facility]. Re-faxed order and contacted the patient to let her know what I did. Advised patient that I had left a message for [facility] to return my call to find out what the status of this order is. Anderson Malta with [facility] returned my call and stated that she had contacted the patient today and patient picked up the device and will do study tonight. Anderson Malta stated that the original order was received, however, who ever took the order off the fax, failed to let her know of the order. Pt will drop the device off back to [facility] tomorrow and Anderson Malta will send report off to be read. Nothing else needed at this time. Rhonda J Cobb

## 2014-12-13 ENCOUNTER — Ambulatory Visit
Admit: 2014-12-13 | Disposition: A | Discharge status: Home / Self Care | Payer: Self-pay | Attending: Hematology and Oncology | Admitting: Hematology and Oncology

## 2014-12-13 LAB — COMPREHENSIVE METABOLIC PANEL
Albumin: 4.1 g/dL
Alkaline Phosphatase: 91 U/L
Anion Gap: 7 (ref 7–16)
BUN: 17 mg/dL
Bilirubin,Total: 0.1 mg/dL — ABNORMAL LOW
Calcium, Total: 9.1 mg/dL
Chloride: 102 mmol/L
Co2: 27 mmol/L
Creatinine: 1.21 mg/dL — ABNORMAL HIGH
EGFR (African American): 54 — ABNORMAL LOW
EGFR (Non-African Amer.): 47 — ABNORMAL LOW
Glucose: 225 mg/dL — ABNORMAL HIGH
Potassium: 4.1 mmol/L
SGOT(AST): 28 U/L
SGPT (ALT): 25 U/L
Sodium: 136 mmol/L
Total Protein: 7.4 g/dL

## 2014-12-13 LAB — CBC CANCER CENTER
Basophil #: 0 x10 3/mm (ref 0.0–0.1)
Basophil %: 1 %
Eosinophil #: 0.2 x10 3/mm (ref 0.0–0.7)
Eosinophil %: 4 %
HCT: 35.3 % (ref 35.0–47.0)
HGB: 12 g/dL (ref 12.0–16.0)
Lymphocyte #: 1.4 x10 3/mm (ref 1.0–3.6)
Lymphocyte %: 29.6 %
MCH: 31.2 pg (ref 26.0–34.0)
MCHC: 33.9 g/dL (ref 32.0–36.0)
MCV: 92 fL (ref 80–100)
Monocyte #: 0.5 x10 3/mm (ref 0.2–0.9)
Monocyte %: 11.4 %
Neutrophil #: 2.5 x10 3/mm (ref 1.4–6.5)
Neutrophil %: 54 %
Platelet: 254 x10 3/mm (ref 150–440)
RBC: 3.84 10*6/uL (ref 3.80–5.20)
RDW: 12.6 % (ref 11.5–14.5)
WBC: 4.6 x10 3/mm (ref 3.6–11.0)

## 2014-12-13 LAB — IRON AND TIBC
Iron Bind.Cap.(Total): 378 (ref 250–450)
Iron Saturation: 12.7
Iron: 48 ug/dL
Unbound Iron-Bind.Cap.: 330.1

## 2014-12-13 LAB — SEDIMENTATION RATE: Erythrocyte Sed Rate: 37 mm/hr — ABNORMAL HIGH (ref 0–30)

## 2014-12-13 LAB — FERRITIN: Ferritin (ARMC): 11 ng/mL

## 2014-12-16 ENCOUNTER — Inpatient Hospital Stay
Admit: 2014-12-16 | Disposition: A | Discharge status: Home / Self Care | Payer: Self-pay | Attending: Internal Medicine | Admitting: Internal Medicine

## 2014-12-16 LAB — COMPREHENSIVE METABOLIC PANEL
ALT: 25 U/L
ANION GAP: 8 (ref 7–16)
Albumin: 4.2 g/dL
Alkaline Phosphatase: 91 U/L
BUN: 17 mg/dL
Bilirubin,Total: 0.5 mg/dL
CO2: 26 mmol/L
Calcium, Total: 8.7 mg/dL — ABNORMAL LOW
Chloride: 99 mmol/L — ABNORMAL LOW
Creatinine: 0.86 mg/dL
EGFR (Non-African Amer.): >60
GLUCOSE: 244 mg/dL — AB
Potassium: 4.4 mmol/L
SGOT(AST): 29 U/L
Sodium: 133 mmol/L — ABNORMAL LOW
TOTAL PROTEIN: 7.5 g/dL

## 2014-12-16 LAB — CBC
HCT: 35.2 % (ref 35.0–47.0)
HGB: 11.6 g/dL — ABNORMAL LOW (ref 12.0–16.0)
MCH: 30.4 pg (ref 26.0–34.0)
MCHC: 32.9 g/dL (ref 32.0–36.0)
MCV: 92 fL (ref 80–100)
Platelet: 241 10*3/uL (ref 150–440)
RBC: 3.82 10*6/uL (ref 3.80–5.20)
RDW: 12.5 % (ref 11.5–14.5)
WBC: 11.2 10*3/uL — ABNORMAL HIGH (ref 3.6–11.0)

## 2014-12-16 LAB — URINALYSIS, COMPLETE
BILIRUBIN, UR: NEGATIVE
BLOOD: NEGATIVE
LEUKOCYTE ESTERASE: NEGATIVE
Nitrite: NEGATIVE
PH: 5 (ref 4.5–8.0)
Protein: 30
Specific Gravity: 1.025 (ref 1.003–1.030)

## 2014-12-16 LAB — CK TOTAL AND CKMB (NOT AT ARMC)
CK, TOTAL: 71 U/L
CK, Total: 77 U/L
CK-MB: 1.8 ng/mL
CK-MB: 2.3 ng/mL

## 2014-12-16 LAB — TROPONIN I: Troponin-I: <0.03 ng/mL

## 2014-12-17 LAB — CBC WITH DIFFERENTIAL/PLATELET
BASOS PCT: 0 %
Basophil #: 0 10*3/uL (ref 0.0–0.1)
Eosinophil #: 0 10*3/uL (ref 0.0–0.7)
Eosinophil %: 0 %
HCT: 33.4 % — AB (ref 35.0–47.0)
HGB: 11 g/dL — AB (ref 12.0–16.0)
LYMPHS PCT: 3.7 %
Lymphocyte #: 0.5 10*3/uL — ABNORMAL LOW (ref 1.0–3.6)
MCH: 30.4 pg (ref 26.0–34.0)
MCHC: 33 g/dL (ref 32.0–36.0)
MCV: 92 fL (ref 80–100)
MONOS PCT: 1.1 %
Monocyte #: 0.1 x10 3/mm — ABNORMAL LOW (ref 0.2–0.9)
Neutrophil #: 12.3 10*3/uL — ABNORMAL HIGH (ref 1.4–6.5)
Neutrophil %: 95.2 %
Platelet: 262 10*3/uL (ref 150–440)
RBC: 3.62 10*6/uL — AB (ref 3.80–5.20)
RDW: 13 % (ref 11.5–14.5)
WBC: 13 10*3/uL — ABNORMAL HIGH (ref 3.6–11.0)

## 2014-12-17 LAB — BASIC METABOLIC PANEL
Anion Gap: 8 (ref 7–16)
BUN: 17 mg/dL
CHLORIDE: 106 mmol/L
CREATININE: 0.85 mg/dL
Calcium, Total: 8.8 mg/dL — ABNORMAL LOW
Co2: 24 mmol/L
EGFR (African American): >60
Glucose: 262 mg/dL — ABNORMAL HIGH
Potassium: 4.1 mmol/L
SODIUM: 138 mmol/L

## 2014-12-17 LAB — LIPID PANEL
CHOLESTEROL: 218 mg/dL — AB
HDL Cholesterol: 54 mg/dL
Ldl Cholesterol, Calc: 155 mg/dL — ABNORMAL HIGH
TRIGLYCERIDES: 47 mg/dL
VLDL Cholesterol, Calc: 9 mg/dL

## 2014-12-17 LAB — HEMOGLOBIN A1C: HEMOGLOBIN A1C: 6.8 % — AB

## 2014-12-17 LAB — TSH: THYROID STIMULATING HORM: 0.244 u[IU]/mL — AB

## 2014-12-18 ENCOUNTER — Telehealth: Payer: Self-pay

## 2014-12-18 LAB — T4, FREE: Free Thyroxine: 0.88 ng/dL

## 2014-12-18 NOTE — Telephone Encounter (Signed)
-----   Message from Juanito Doom, MD sent at 12/18/2014  2:09 PM EDT ----- A, Please let her know that her ONO was normal.  However I'm not sure if it was on O2 or not because I received an incomplete report. Thanks B

## 2014-12-18 NOTE — Telephone Encounter (Signed)
lmtcb X1 to relay results. Need to ask pt if 02 was used during ONO.

## 2014-12-19 NOTE — Telephone Encounter (Signed)
According to Epic, pt is not currently hospitalized. lmtcb for pt.

## 2014-12-19 NOTE — Telephone Encounter (Signed)
Spoke with the pt and notified of ONO results  She verbalized understanding  She wants to let BQ know that she is currently hospitalized at [facility] "for my breathing"  Will send him msg to make him aware

## 2014-12-19 NOTE — Telephone Encounter (Signed)
Pt is in hospital at room 233 Pt is at [facility]. We can call her there.

## 2014-12-19 NOTE — Telephone Encounter (Signed)
Noted, thanks!

## 2014-12-20 LAB — BASIC METABOLIC PANEL
ANION GAP: 8 (ref 7–16)
BUN: 24 mg/dL — AB
CHLORIDE: 104 mmol/L
Calcium, Total: 9 mg/dL
Co2: 27 mmol/L
Creatinine: 0.85 mg/dL
EGFR (Non-African Amer.): >60
Glucose: 133 mg/dL — ABNORMAL HIGH
Potassium: 4 mmol/L
Sodium: 139 mmol/L

## 2014-12-20 LAB — CBC WITH DIFFERENTIAL/PLATELET
Basophil #: 0 10*3/uL (ref 0.0–0.1)
Basophil %: 0.1 %
EOS PCT: 0 %
Eosinophil #: 0 10*3/uL (ref 0.0–0.7)
HCT: 34.1 % — ABNORMAL LOW (ref 35.0–47.0)
HGB: 11.3 g/dL — ABNORMAL LOW (ref 12.0–16.0)
LYMPHS PCT: 22.8 %
Lymphocyte #: 2.4 10*3/uL (ref 1.0–3.6)
MCH: 30.3 pg (ref 26.0–34.0)
MCHC: 33.3 g/dL (ref 32.0–36.0)
MCV: 91 fL (ref 80–100)
MONOS PCT: 6.2 %
Monocyte #: 0.6 x10 3/mm (ref 0.2–0.9)
NEUTROS PCT: 70.9 %
Neutrophil #: 7.3 10*3/uL — ABNORMAL HIGH (ref 1.4–6.5)
PLATELETS: 303 10*3/uL (ref 150–440)
RBC: 3.73 10*6/uL — AB (ref 3.80–5.20)
RDW: 12.9 % (ref 11.5–14.5)
WBC: 10.3 10*3/uL (ref 3.6–11.0)

## 2014-12-21 ENCOUNTER — Encounter: Payer: Self-pay | Admitting: Pulmonary Disease

## 2014-12-21 LAB — CREATININE, SERUM: Creatine, Serum: 0.85

## 2014-12-25 NOTE — Discharge Summary (Signed)
PATIENT NAME:  Leslie Olsen, Leslie Olsen MR#:  [id number] DATE OF BIRTH:  [birth year]  DATE OF ADMISSION:  06/30/2012 DATE OF DISCHARGE:  07/02/2012  PRIMARY CARE PHYSICIAN: Dr. Rosario Jacks   FINAL DISCHARGE DIAGNOSES:  1. Chronic obstructive pulmonary disease exacerbation.  2. Hypothyroidism.   CONDITION: Stable.   CODE STATUS: FULL CODE.   HOME MEDICATIONS:  1. Symbicort 160 mcg/4.5 mcg inhalation 2 puffs b.i.d.  2. Caprice Renshaw Pressair 400 mcg inhalation 1 puff b.i.d.  3. Synthroid 88 mcg p.o. daily. 4. ProAir HFA 1 puff p.r.n. for shortness of breath.  5. Aspirin 81 mg p.o. daily. 6. Fish oil oral capsule 1 cap daily.  7. Red yeast rice 600 mg p.o. capsule 1 cap daily.  8. Calcium 600 + D 600 mg/200 international units p.o. tablet 1 tablet p.o. daily.  9. Prednisone 20 mg p.o. daily for two days, then 10 mg p.o. for two days then 5 mg p.o. for two days.   DIET: Regular diet.   ACTIVITY: As tolerated.   FOLLOW-UP CARE: Follow up with PCP with 1 to 2 weeks.   HOSPITAL COURSE: Patient is a 64 year old Caucasian female with history of chronic obstructive pulmonary disease, hypothyroidism presented to ED with shortness of breath and wheezing. She received DuoNeb twice and admitted for chronic obstructive pulmonary disease exacerbation. After admission patient has been treated with Solu-Medrol IV and DuoNebs. She has tachycardia. She was treated with small dose of beta blocker. Patient's CAT scan of chest showed no pulmonary embolus consistent with chronic obstructive pulmonary disease. After above-mentioned treatment patient's symptoms have much improved. She is clinically stable. Will be discharged to home today.   ____________________________ Demetrios Loll, MD qc:cms D: 07/02/2012 12:15:20 ET T: 07/02/2012 12:35:03 ET JOB#: [id number]  cc: Demetrios Loll, MD, <Dictator> Isla Pence, MD   Entered as incorrect report type; entered as history and physical but should be discharge summary. Demetrios Loll  MD ELECTRONICALLY SIGNED 07/03/2012 14:55

## 2014-12-25 NOTE — H&P (Signed)
PATIENT NAME:  Leslie Olsen, SHOR MR#:  [id number] DATE OF BIRTH:  August 16, 1951  DATE OF ADMISSION:  06/30/2012  PRIMARY CARE PHYSICIAN: Dr. Rosario Jacks REFERRING PHYSICIAN: Dr. Corky Downs   CHIEF COMPLAINT: Shortness of breath.   HISTORY OF PRESENT ILLNESS: 64 year old female with history of chronic obstructive pulmonary disease, hypothyroidism came in because of shortness of breath going on for last few weeks, gotten worse yesterday. Since yesterday patient tried  inhalers and  Symbicort at home, but no relief. Because of shortness of breath she came in here. When she came she was wheezing and she received DuoNebs two times and I was asked to admit for chronic obstructive pulmonary disease flare. Patient says that she has been having this shortness of breath for a few weeks, really gotten worse. She wanted to get oxygen from primary doctor but she was advised to get the sleep studies first. Patient denies chest pain but feels tight in her chest. No cough, no fever. patient has no extremity edema, no recent history of travel. Patient is not able to talk without catching her breath and also heart rate up to 115 when she is trying to talk so that is why we are going to keep her overnight. Patient's heart rate is up to 115 when she talks.   PAST MEDICAL HISTORY:  1. Chronic obstructive pulmonary disease. She was diagnosed with chronic obstructive pulmonary disease last year, February.   2. History of hypothyroidism.  ALLERGIES: No known allergies.   SOCIAL HISTORY: Previous smoker, heavy, two packs of cigarettes for almost 47 years, quit now. No alcohol. No drugs. 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   PAST SURGICAL HISTORY:  1. Right total knee replacement. 2. Right shoulder surgery a few years ago.    FAMILY HISTORY: Patient's son has hypertension but no diabetes.   MEDICATIONS: 1. Aspirin 81 mg daily. 2. Symbicort 160/4.5 inhalation 2 puffs b.i.d.  3. Synthroid 88 mcg daily. 4. Tudorza 400 mg  inhalation 1 puff b.i.d. 5. ProAir as needed.  6. Fish oil 1 capsule daily.  7. Calcium with vitamin D 1 tablet daily.    REVIEW OF SYSTEMS: CONSTITUTIONAL: Feels fatigue but no fever. EYES: No blurred vision. ENT: No tinnitus. No epistaxis. No difficulty swallowing. RESPIRATORY: Has tightness in the chest and shortness of breath. CARDIOVASCULAR: No chest pain. No orthopnea, no PND, no palpitations. GASTROINTESTINAL: No nausea. No vomiting. No abdominal pain. GENITOURINARY: No dysuria. ENDOCRINE: No polyuria, no polydipsia, thyroid problems present. INTEGUMENTARY: No skin rashes. MUSCULOSKELETAL: No joint pain. NEUROLOGIC: No numbness or weakness or dysarthria. PSYCH: No anxiety or insomnia.   PHYSICAL EXAMINATION:  VITAL SIGNS: Temperature not documented in the ER, will get temperature, blood pressure 124/82, respirations 20, sats 95% on room air.   GENERAL: Alert, awake, oriented.  HEENT: Atraumatic, normocephalic. Pupils equally reacting to light. Extraocular movements are intact.   ENT: No tympanic membrane congestion. No turbinate hypertrophy. No oropharyngeal erythema.   NECK: Normal range of motion. No jugular venous distention. No carotid bruits.    CARDIOVASCULAR: S1, S2 regular. No murmurs.   LUNGS: Decreased air entry bilaterally overall. No wheezing.  ABDOMEN: Soft, nontender, nondistended. Bowel sounds present.   EXTREMITIES: No extremity edema. No cyanosis. No clubbing. No Homans sign.    NEUROLOGIC: Alert, awake, oriented. Cranial nerves II through XII intact. Power 5/5 in upper and lower extremities. Sensation is intact. Deep tendon reflexes 2+ bilaterally. No focal neurological deficit.   PSYCHIATRIC: Mood and affect are within normal limits.  LABORATORY, DIAGNOSTIC AND RADIOLOGICAL DATA: WBC 6.2, hemoglobin 13.2, hematocrit 38.2, platelets 250. Electrolytes: Sodium 141, potassium 3.8, chloride 106, bicarbonate 25, BUN 18, creatinine 0.80, glucose 180, troponin less  than 0.02. EKG normal sinus at 89 beats per minute. No ST-T changes.   ASSESSMENT AND PLAN: Patient is a 64 year old female with chronic obstructive pulmonary disease came in with:  1. Shortness of breath and wheezing initially, right now overall  diminished. Likely mild chronic obstructive pulmonary disease flare with acute on chronic respiratory failure. Patient needs to be observed overnight. Continue DuoNebs and Solu-Medrol along with oxygen. Patient's troponins are negative but will also trend the troponins to evaluate for cardiac etiology. Patient will be evaluated for home oxygen need. She was told that she has two small nodules in the right lung. Supposed to get a CT of the chest again in December so we will get CT of the chest again here for follow up on that and also see if there is anything else going on.  2. Sinus tachycardia likely secondary to respiratory distress. add  small dose of beta blocker.  3. Hypothyroidism. Continue Synthroid.  4. Patient has emphysema and history of tobacco abuse with emphysema. Patient sees pulmonologist at [facility].   TIME SPENT: About 55 minutes. This is an observation patient.    ____________________________ Epifanio Lesches, MD sk:cms D: 06/30/2012 15:08:47 ET T: 06/30/2012 15:35:03 ET JOB#: [id number] cc: Epifanio Lesches, MD, <Dictator> Isla Pence, MD Epifanio Lesches MD ELECTRONICALLY SIGNED 07/19/2012 15:51

## 2015-01-06 NOTE — H&P (Signed)
PATIENT NAME:  Leslie Olsen, Leslie Olsen MR#:  [id number] DATE OF BIRTH:  1950-12-17  DATE OF ADMISSION:  12/16/2014  REFERRING EMERGENCY ROOM PHYSICIAN: Sheryl L. Benjaman Lobe, MD   PRIMARY CARE PHYSICIAN: Valerie Roys, DO, at [facility].   PRIMARY PULMONOLOGIST:  Juanito Doom, MD   CHIEF COMPLAINT: Syncopal event.   HISTORY OF PRESENT ILLNESS: This very pleasant 64 year old woman with past medical history of COPD, not on home oxygen, diabetes, obstructive sleep apnea, coronary artery disease, presents today after a syncopal event which happened while she was eating lunch. She reports that she has been feeling poorly for 3-4 days with increasing cough. No sputum production or fever. She has had shortness of breath with exertion and felt very fatigued. She was out at [employer] in eating lunch, when she began to feel acutely ill. She felt hot and became very diaphoretic, stated to her family that she felt ill. Her eyes fixed. She became unable to focus and then fell backwards in her chair. There was a paramedic present who leaned her forward, so that food could be removed from her mouth. He then assisted her to the floor, and by the time she was lying down on the floor, she was talking and oriented. The timeframe for her loss of consciousness was very short. She had no subsequent confusion or neurologic deficit. On arrival of EMS, she was very diaphoretic and oxygen saturation was 92% on room air.   In the Emergency Room, she has continued to be hypoxic with saturations in the low 90s even after bronchodilation and steroids. She is being admitted for syncopal event and COPD exacerbation.   PAST MEDICAL HISTORY: 1.  Iron deficiency anemia followed at the cancer center.  2.  COPD, not on home oxygen.  3.  Obstructive sleep apnea, compliant with CPAP.  4.  Hypothyroidism.  5.  Coronary artery disease, status post PCI in 2007.  6.  Diabetes mellitus, type 2.   PAST SURGICAL HISTORY: 1.   Total knee replacement.  2.  PCI.  3.  Cholecystectomy.  4.  Laminectomy.  5.  Hysterectomy.   SOCIAL HISTORY: She lives alone. She does not use a cane or walker. 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  She is a former smoker with a 45 pack-year history. She quit smoking 3 years ago. She does not use alcohol or any illicit substances.   FAMILY MEDICAL HISTORY: Positive for coronary artery disease in her sister and a brother who has a pacemaker. No history of stroke or other arrhythmia that she knows of.   REVIEW OF SYSTEMS: CONSTITUTIONAL: Positive for fatigue and weakness over the past 3-4 days. No change in weight. No fevers.  HEENT: Positive for blurry vision at the time of syncopal event; otherwise, no change in vision or hearing. No pain in the eyes or ears. No sinus congestion or difficulty swallowing.  RESPIRATORY: Positive for cough, wheezing. No sputum production or hemoptysis. Positive for dyspnea with exertion. No painful respirations.  CARDIOVASCULAR: No chest pain, orthopnea, edema, or palpitations. Positive for syncope as described above.  GASTROINTESTINAL: No nausea, vomiting, diarrhea, abdominal pain, or change in bowel habits.  GENITOURINARY: No dysuria or frequency.  ENDOCRINE: No polyuria, polydipsia, hot or cold intolerance, though she was very diaphoretic at the time of her syncopal event.  HEMATOLOGIC: No easy bruising or bleeding. She does have a history of anemia.  SKIN: No new rashes or lesions.  MUSCULOSKELETAL:  No new pain in the neck, back, shoulders, knees, or hips. No gout.  NEUROLOGIC: Positive for syncopal event this morning; no focal numbness or weakness. No confusion. No history of dementia, headache, or migraine.  PSYCHIATRIC: No bipolar disorder or schizophrenia.   HOME MEDICATIONS: 1.  Synthroid 88 mcg 1 tablet daily.  2.  Symbicort 160 mcg/4.5 mcg per inhalations 2 puffs twice a day.  3.  Red yeast rice 600 mg 1 capsule once a day.   4.  ProAir HFA 1 puff as needed every 4 hours for shortness of breath.  5.  Metformin 500 mg 1 tablet once a day (the patient is not taking this medication because it gives her diarrhea).  6.  Fish oil oral capsule 1 tablet daily.  7.  Calcium 600 mg, vitamin D 200 International Units 1 tablet once a day.  8.  Aspirin 81 mg daily.   ALLERGIES: No known allergies.   PHYSICAL EXAMINATION: VITAL SIGNS: Temperature 97.9, pulse 112, respirations 24, blood pressure 154/71, oxygenation 99% on 2 liters nasal cannula.   GENERAL: No acute distress.  HEENT: Pupils equal, round, and reactive to light. Extraocular motion intact. Conjunctivae are clear. Oral mucous membranes pink and moist. Good dentition. Posterior oropharynx is clear with no exudate, edema, or erythema. No cervical lymphadenopathy. Trachea is midline.  RESPIRATORY:  Fair air movement. No wheezes, rhonchi, or rales.  She is very reactive with a lot of coughing on inspiration or when trying to talk.  CARDIOVASCULAR: Tachycardic, regular. No murmurs, rubs, or gallops. No peripheral edema. Peripheral pulses 2+.  ABDOMEN: Soft, nontender, nondistended. Bowel sounds normal. No guarding, no rebound, no hepatosplenomegaly. No mass.  MUSCULOSKELETAL: Range of motion normal. Strength 5/5 throughout.  SKIN: She does have a skin tear over the right forearm and some bruising over the left forearm; otherwise, no rash or open lesions.  NEUROLOGIC: Cranial nerves II-XII grossly intact. Strength and sensation intact.  PSYCHIATRIC: Alert and oriented with good insight into her clinical condition.   LABORATORY DATA: Sodium 133, potassium 4.4, chloride 99, bicarbonate 26, BUN 17, creatinine 0.86, glucose 244, calcium 8.7. LFTs are normal. Troponin is less than 0.03. White blood cells 11.2, hemoglobin 11.6, platelets 241,000, MCV 92. UA is negative for signs of infection.   IMAGING: CT of the head, no acute intracranial abnormality. No definite acute  cortical infarction. Chest x-ray shows bronchitic changes without acute infiltrate, peribronchial thickening centrally.   ASSESSMENT AND PLAN: 1.  Acute respiratory failure with hypoxia likely due to chronic obstructive pulmonary disease exacerbation: Chest x-ray is negative for pneumonia. She may have chronic respiratory failure, particularly at night. She reports that she recently had a home overnight pulse oximetry study and seemed to desaturate during the night, though she has not received formal results from that study. We will obtain blood cultures. Will treat with nebulizer, steroids, and Levaquin. We will provide supplemental oxygen as needed. Hopefully, she can wean off of supplemental oxygen by the time of discharge.  2.  Syncopal event: This sounds very much like a vagal event with diaphoresis, very brief loss of consciousness, no residual confusion or neurologic defect. Initial troponin is negative. On telemetry, she is in sinus tachycardia. We will cycle cardiac enzymes to rule out acute coronary syndrome. CT scan of her head is negative for bleeding. Her neurologic examination at this time is normal. She may have become hypoxic and had a syncopal event due to hypoxia. We will check a TSH. Electrolytes are normal at this  time.  3.  Tachycardia: This is likely due to chronic obstructive pulmonary disease exacerbation. We will continue to monitor.  4.  Leukocytosis: She had laboratories drawn at the cancer center 3 days ago. At that time her white blood cell count was 4.6, today it is 11.2. This is likely reactive to chronic obstructive pulmonary disease exacerbation and syncopal event this morning. We will continue to monitor.  5.  Diabetes mellitus, type 2, blood sugars on presentation are very elevated. We will check a hemoglobin A1c and start sliding scale insulin. She is not taking metformin because it gives her diarrhea. She needs a new outpatient regimen.  6.  Patient is a full code.    TIME SPENT ON ADMISSION: 45 minutes.    ____________________________ Earleen Newport. Volanda Napoleon, MD cpw:LT D: 12/16/2014 15:57:20 ET T: 12/16/2014 16:23:20 ET JOB#: [id number]  cc: Barnetta Chapel P. Volanda Napoleon, MD, <Dictator> Aldean Jewett MD ELECTRONICALLY SIGNED 12/17/2014 20:51

## 2015-01-06 NOTE — Discharge Summary (Signed)
PATIENT NAME:  Leslie Olsen, Leslie Olsen MR#:  [id number] DATE OF BIRTH:  1951-03-24  DATE OF ADMISSION:  12/16/2014 DATE OF DISCHARGE:  12/20/2014  ADMISSION DIAGNOSIS: Syncopal event.   DISCHARGE DIAGNOSES: 1. Acute respiratory failure with hypoxia due to acute chronic obstructive pulmonary disease exacerbation.  2. Acute exacerbation of chronic obstructive pulmonary disease, not oxygen dependent.  3. Syncopal event, likely vagal and not cardiogenic.  4. Diabetes mellitus type 2, not insulin-dependent.  5. Hypothyroidism.  6. Coronary artery disease, status post PCI in 2007.  7. Likely obstructive sleep apnea, sleep study ongoing with primary care physician.  8. Iron deficiency anemia followed by the [facility].   CONSULTATIONS: None.   PROCEDURES: 1. CT scan of the head without contrast, April 10, shows no acute intracranial abnormality and no definite acute cortical infarction.  2. Chest x-ray, April 10, shows bronchitic changes without acute infiltrate,   HISTORY OF PRESENT ILLNESS: This 64 year old woman with past medical history of COPD, diabetes, obstructive sleep apnea, coronary artery disease, presents after a syncopal event, which happened while she was eating lunch. She reports that she has been feeling poorly for 3-4 days, with increasing cough and shortness of breath. No sputum production or fever. She has had shortness of breath with exertion and then feeling very fatigued. She was eating lunch, when she became hot, diaphoretic, and felt acutely ill. She fell backwards into her chair and for several seconds, was not responsive. There was a paramedic present, who leaned her forward and then assisted her to the floor. On arrival of EMS, she was diaphoretic and oxygen saturation was 92% on room air.   HOSPITAL COURSE BY PROBLEM:  1. Syncopal event: EKG is normal on arrival. Troponins were negative. The patient had no focal neurologic defects and her description was consistent with a  vagal event. CT of the head was negative. She did not sustain any trauma when she fell.  2. Acute chronic obstructive pulmonary disease exacerbation: She was treated with standard regimen of bronchodilators, IV steroids, supplemental oxygen, and home inhalers. She recovered well and at the time of discharge, is not requiring any supplemental oxygenation to keep her peripheral oxygen greater than 95%. Chest x-ray was clear, showed only bronchitic changes, without any signs of focal pneumonia.  3. Tachycardia: The patient was followed on telemetry and had initial tachycardia, which is likely due to chronic obstructive pulmonary disease exacerbation and steroid treatment. This has improved at the time of discharge.  4. Leukocytosis: This is likely due to steroid administration.  5. Diabetes mellitus type 2: She reports that she does not actually take metformin at home. Her hemoglobin A1c is 6.8. Her blood sugars were elevated during the hospitalization. She required insulin via sliding scale. This is likely due to steroid administration. She is not being discharged on any additional medications. She is to follow up with her primary care physician for further treatment of diabetes and blood sugar monitoring. At her age, her hemoglobin A1c may be appropriate, with only lifestyle modification to bring it down.   DISCHARGE PHYSICAL EXAMINATION: VITAL SIGNS: Temperature 98.1, pulse 69, respirations 17, blood pressure 144/78, oxygenation 96% on room air at rest and 95 with exertion.   GENERAL: No acute distress.  PULMONARY: Lungs clear to auscultation bilaterally with good air movement. No respiratory distress.  CARDIOVASCULAR: Regular rate and rhythm. No murmurs, rubs, or gallops.   LABORATORY DATA: Sodium 139, potassium 4.0, chloride 104, bicarbonate 27, BUN 24, creatinine 0.85, glucose 334. White blood  cells 10.3, hemoglobin 11.3, platelets 303,000. MCV is 91.   DISCHARGE MEDICATIONS: 1. Symbicort 160  mcg/4.5 mcg 2 puffs inhaled twice a day.  2. Synthroid 88 mcg 1 tablet once a day.  3. Aspirin 81 mg 1 tablet once a day.  4. Fish oil oral capsule 1 tablet once a day.  5. Red yeast rice 600 mg 1 capsule once a day.  6. Calcium vitamin D 600 mg 200 international units 1 tablet daily.  7. ProAir HFA 1 puff 4 times a day as needed for shortness of breath.  8. Prednisone 50 mg orally 1 tablet once a day for 2 more days and then stop.  9. Guaifenesin 100 mg in 5 mL, 10 mL every 6 hours as needed for cough.  10. Spiriva 18 mcg 1 capsule inhaled daily.   CONDITION ON DISCHARGE: Stable.   DISPOSITION: Discharged to home with no further home health needs. No oxygen needed.   DISCHARGE INSTRUCTIONS:  DIET: Low fat, low cholesterol, carbohydrate controlled diet.   ACTIVITY LIMITATIONS: None.   TIME FRAME FOR FOLLOWUP: Please follow up with your primary care physician within 1-2 weeks to discuss blood sugars and respiratory status.   TIME SPENT ON DISCHARGE: 35 minutes.     ____________________________ Earleen Newport. Volanda Napoleon, MD cpw:mw D: 12/26/2014 15:05:59 ET T: 12/26/2014 15:26:08 ET JOB#: [id number]  cc: Barnetta Chapel P. Volanda Napoleon, MD, <Dictator> Aldean Jewett MD ELECTRONICALLY SIGNED 12/26/2014 18:48

## 2015-01-14 ENCOUNTER — Other Ambulatory Visit: Payer: BLUE CROSS/BLUE SHIELD

## 2015-01-14 ENCOUNTER — Inpatient Hospital Stay: Payer: BLUE CROSS/BLUE SHIELD | Attending: Hematology and Oncology | Admitting: Hematology and Oncology

## 2015-01-14 ENCOUNTER — Encounter: Payer: Self-pay | Admitting: Hematology and Oncology

## 2015-01-14 ENCOUNTER — Encounter: Payer: BLUE CROSS/BLUE SHIELD | Admitting: Hematology and Oncology

## 2015-01-14 VITALS — BP 123/82 | HR 96 | Temp 96.7°F | Resp 20 | Wt 201.9 lb

## 2015-01-14 DIAGNOSIS — Z801 Family history of malignant neoplasm of trachea, bronchus and lung: Secondary | ICD-10-CM | POA: Diagnosis not present

## 2015-01-14 DIAGNOSIS — K219 Gastro-esophageal reflux disease without esophagitis: Secondary | ICD-10-CM

## 2015-01-14 DIAGNOSIS — E039 Hypothyroidism, unspecified: Secondary | ICD-10-CM | POA: Diagnosis not present

## 2015-01-14 DIAGNOSIS — D72829 Elevated white blood cell count, unspecified: Secondary | ICD-10-CM | POA: Insufficient documentation

## 2015-01-14 DIAGNOSIS — F17210 Nicotine dependence, cigarettes, uncomplicated: Secondary | ICD-10-CM | POA: Diagnosis not present

## 2015-01-14 DIAGNOSIS — Z7982 Long term (current) use of aspirin: Secondary | ICD-10-CM | POA: Diagnosis not present

## 2015-01-14 DIAGNOSIS — R5382 Chronic fatigue, unspecified: Secondary | ICD-10-CM | POA: Diagnosis not present

## 2015-01-14 DIAGNOSIS — Z7952 Long term (current) use of systemic steroids: Secondary | ICD-10-CM | POA: Diagnosis not present

## 2015-01-14 DIAGNOSIS — D509 Iron deficiency anemia, unspecified: Secondary | ICD-10-CM | POA: Insufficient documentation

## 2015-01-14 DIAGNOSIS — Z79899 Other long term (current) drug therapy: Secondary | ICD-10-CM | POA: Diagnosis not present

## 2015-01-14 DIAGNOSIS — J449 Chronic obstructive pulmonary disease, unspecified: Secondary | ICD-10-CM | POA: Diagnosis not present

## 2015-01-14 DIAGNOSIS — Z803 Family history of malignant neoplasm of breast: Secondary | ICD-10-CM | POA: Insufficient documentation

## 2015-01-14 LAB — CBC WITH DIFFERENTIAL/PLATELET
Basophils Absolute: 0.1 10*3/uL (ref 0–0.1)
Basophils Relative: 1 %
Eosinophils Absolute: 0.2 10*3/uL (ref 0–0.7)
Eosinophils Relative: 4 %
HCT: 35.8 % (ref 35.0–47.0)
Hemoglobin: 11.8 g/dL — ABNORMAL LOW (ref 12.0–16.0)
Lymphocytes Relative: 38 %
Lymphs Abs: 2 10*3/uL (ref 1.0–3.6)
MCH: 29.5 pg (ref 26.0–34.0)
MCHC: 33 g/dL (ref 32.0–36.0)
MCV: 89.2 fL (ref 80.0–100.0)
Monocytes Absolute: 0.4 10*3/uL (ref 0.2–0.9)
Monocytes Relative: 8 %
Neutro Abs: 2.6 10*3/uL (ref 1.4–6.5)
Neutrophils Relative %: 49 %
Platelets: 368 10*3/uL (ref 150–440)
RBC: 4.01 MIL/uL (ref 3.80–5.20)
RDW: 13.8 % (ref 11.5–14.5)
WBC: 5.3 10*3/uL (ref 3.6–11.0)

## 2015-01-14 LAB — FERRITIN: Ferritin: 15 ng/mL (ref 11–307)

## 2015-01-15 ENCOUNTER — Telehealth: Payer: Self-pay | Admitting: *Deleted

## 2015-01-15 NOTE — Telephone Encounter (Signed)
Spoke with patient and told her I showed Dr. Mike Gip her labs from yesterday. Dr. Mike Gip stated that her Ferritin of 15 is up from last visit of 11 and her Hgb 11.8 and Hct 35.8 are better and should not be causing extreme fatigue. Per Dr. Mike Gip, patient is to continue taking oral iron with orange juice and if she continues to feel increased fatigue, she should see her PCP. Patient stated understanding and states she was glad that her numbers are looking better. She thanked me for calling and stated understanding that she would get a call or card in the mail for her return appointment with Dr. Mike Gip in 3 months.

## 2015-01-15 NOTE — Telephone Encounter (Signed)
States she was promisede she would get a call from [place] regarding lab results yesterday. Wants to know what they are and if she needs treatment

## 2015-02-17 NOTE — Progress Notes (Signed)
[facility] day:  01/14/2015  Chief Complaint: Rokia Bosket is an 64 y.o. female with iron deficiency anemia who seen for 1 month assessment and re-evaluation after interval hospitalization.  HPI: The patient was last seen in the medical oncology clinic on 12/13/2014.  At that time, she was seen for 3 month assessment. Symptomatically she had chronic fatigue. Labs included a hematocrit 35.3, hemoglobin 12, MCV 92, platelets 254,000 and white count 4600 with an ANC of 2500. Ferritin was 11 (low) with a normal TIBC.  We discussed continuation of iron rich food and oral iron with orange juice or vitamin C.  Ferritin goal was 100.  Patient was admitted to [facility] on 12/16/2014 through 12/20/2014. She was admitted with syncopal event felt due to vasovagal response. EKG and troponins were normal. Head CT was negative. She had an acute chest COPD flare. She was treated with bronchodilators, IV steroids, submental oxygen and home inhalers. She has a mild leukocytosis due to steroids.  Symptomatically, she states that she is doing "okay". She is using a CPAP. She states that she is taking 2 iron pills a day with orange juice. She notes that her diet has not change in 40 years.  She notes no improvement in energy level.  Last week thyroid function tests were "ok".  Past Medical History  Diagnosis Date  . COPD (chronic obstructive pulmonary disease)   . GERD (gastroesophageal reflux disease)   . Hypothyroidism   . Anemia     Past Surgical History  Procedure Laterality Date  . Abdominal hysterectomy    . Back surgery    . Shoulder surgery    . Coronary stent placement    . Tibia fracture surgery    . Replacement total knee    . Cholecystectomy      Family History  Problem Relation Age of Onset  . Lung cancer Maternal Aunt   . Breast cancer Maternal Uncle   . Other Mother     Killed in car accident, 76  . Other Father      Killed in car accident, 44  . Thyroid disease Brother   . Thyroid disease Sister     Social History:  reports that she quit smoking about 3 years ago. Her smoking use included Cigarettes. She has a 94 pack-year smoking history. She has never used smokeless tobacco. She reports that she does not drink alcohol or use illicit drugs.    Allergies: No Known Allergies  Current Medications: Current Outpatient Prescriptions  Medication Sig Dispense Refill  . albuterol (PROVENTIL HFA;VENTOLIN HFA) 108 (90 BASE) MCG/ACT inhaler Inhale 2 puffs into the lungs every 6 (six) hours as needed for wheezing.    Marland Kitchen aspirin 81 MG tablet Take 81 mg by mouth daily.    . empagliflozin (JARDIANCE) 10 MG TABS tablet Take 10 mg by mouth daily.    Marland Kitchen esomeprazole (NEXIUM) 20 MG capsule Take 20 mg by mouth daily before breakfast.    . ferrous fumarate (HEMOCYTE - 106 MG FE) 325 (106 FE) MG TABS tablet Take 1 tablet by mouth daily.    Marland Kitchen levothyroxine (SYNTHROID, LEVOTHROID) 88 MCG tablet Take 88 mcg by mouth daily.     . Omega-3 Fatty Acids (FISH OIL) 1200 MG CAPS Take 1 capsule by mouth daily.    . Red Yeast Rice 600 MG CAPS Take 2 capsules by mouth daily.    . SYMBICORT 160-4.5 MCG/ACT inhaler INHALE TWO  PUFFS INTO THE LUNGS TWICE DAILY 1 Inhaler 5  . guaiFENesin (ROBITUSSIN) 100 MG/5ML liquid Take 200 mg by mouth every 6 (six) hours as needed for cough.    . predniSONE (DELTASONE) 50 MG tablet Take 50 mg by mouth daily with breakfast.     No current facility-administered medications for this visit.    Review of Systems:  GENERAL:  Feels "ok".  No fevers, sweats or weight loss. PERFORMANCE STATUS (ECOG):  1 HEENT:  No visual changes, runny nose, sore throat, mouth sores or tenderness. Lungs: No shortness of breath or cough.  No hemoptysis.  Using CPAP. Cardiac:  No chest pain, palpitations, orthopnea, or PND. GI:  No nausea, vomiting, diarrhea, constipation, melena or hematochezia. GU:  No urgency,  frequency, dysuria, or hematuria. Musculoskeletal:  No back pain.  No joint pain.  No muscle tenderness. Extremities:  No pain or swelling. Skin:  No rashes or skin changes. Neuro:  No headache, numbness or weakness, balance or coordination issues. Endocrine:  Hypothyroid on Synthrois.  Recent labs "ok".  No diabetes, hot flashes or night sweats. Psych:  No mood changes, depression or anxiety. Pain:  No focal pain. Review of systems:  All other systems reviewed and found to be negative.   Physical Exam: Blood pressure 123/82, pulse 96, temperature 96.7 F (35.9 C), temperature source Tympanic, resp. rate 20, weight 201 lb 15.1 oz (91.6 kg), SpO2 96 %.  GENERAL:  Well developed, well nourished, sitting comfortably in the exam room in no acute distress. MENTAL STATUS:  Alert and oriented to person, place and time. HEAD:  Short gray hair.  Normocephalic, atraumatic, face symmetric, no Cushingoid features. EYES:  Blue/gray eyes.  Pupils equal round and reactive to light and accomodation.  No conjunctivitis or scleral icterus. ENT:  Oropharynx clear without lesion.  Tongue normal. Mucous membranes moist.  RESPIRATORY:  Clear to auscultation without rales, wheezes or rhonchi. CARDIOVASCULAR:  Regular rate and rhythm without murmur, rub or gallop. ABDOMEN:  Soft, non-tender, with active bowel sounds, and no hepatosplenomegaly.  No masses. SKIN:  No rashes, ulcers or lesions. EXTREMITIES: No edema, no skin discoloration or tenderness.  No palpable cords. LYMPH NODES: No palpable cervical, supraclavicular, axillary or inguinal adenopathy  NEUROLOGICAL: Unremarkable. PSYCH:  Appropriate.   Appointment on 01/14/2015  Component Date Value Ref Range Status  . WBC 01/14/2015 5.3  3.6 - 11.0 K/uL Final  . RBC 01/14/2015 4.01  3.80 - 5.20 MIL/uL Final  . Hemoglobin 01/14/2015 11.8* 12.0 - 16.0 g/dL Final  . HCT 01/14/2015 35.8  35.0 - 47.0 % Final  . MCV 01/14/2015 89.2  80.0 - 100.0 fL Final  .  MCH 01/14/2015 29.5  26.0 - 34.0 pg Final  . MCHC 01/14/2015 33.0  32.0 - 36.0 g/dL Final  . RDW 01/14/2015 13.8  11.5 - 14.5 % Final  . Platelets 01/14/2015 368  150 - 440 K/uL Final  . Neutrophils Relative % 01/14/2015 49   Final  . Neutro Abs 01/14/2015 2.6  1.4 - 6.5 K/uL Final  . Lymphocytes Relative 01/14/2015 38   Final  . Lymphs Abs 01/14/2015 2.0  1.0 - 3.6 K/uL Final  . Monocytes Relative 01/14/2015 8   Final  . Monocytes Absolute 01/14/2015 0.4  0.2 - 0.9 K/uL Final  . Eosinophils Relative 01/14/2015 4   Final  . Eosinophils Absolute 01/14/2015 0.2  0 - 0.7 K/uL Final  . Basophils Relative 01/14/2015 1   Final  . Basophils Absolute 01/14/2015 0.1  0 - 0.1 K/uL Final  . Ferritin 01/14/2015 15  11 - 307 ng/mL Final    Assessment:  Belle Charlie is an 64 y.o. female with a history of iron deficiency anemia.  Colonoscopy and upper endoscopy were negative on 06/05/2014.  Her diet is good.  She denies any melena or hematochezia.  She denies any uterine bleeding.    Hematocrit and MCV are normal.  She is on oral iron twice a day with OJ..  Iron stores remain low (ferritin 15).  She has a 90 pack year smoking history.  Chest CT on 08/23/2013 revealed a questionable lesion at the pleura.  Chest CT on 08/23/2014 suggested benign etiology with similar scattered pulmonary nodules, unchanged ground glass opacity posterior right upper lobe, subtle subpleural reticulation, and similar low density enlargement of both adrenals.  She recently underwent overnight oximetry (results pending).  Symptomatically, she has chronic fatigue.  Exam is stable.  Plan: 1. Labs today:  CBC with diff, ferritin. 2. Continue oral iron until ferritin >= 100. 3. Review records from recent admission-done. 4. RTC in 3 months for MD assessment and labs (CBC with diff, ferritin, iron studies, B12, and folate).  Lequita Asal, MD  01/14/2015, 6:54 PM

## 2015-03-15 ENCOUNTER — Other Ambulatory Visit: Payer: Self-pay

## 2015-04-08 ENCOUNTER — Ambulatory Visit (INDEPENDENT_AMBULATORY_CARE_PROVIDER_SITE_OTHER): Payer: PPO | Admitting: Family Medicine

## 2015-04-08 ENCOUNTER — Encounter: Payer: Self-pay | Admitting: Family Medicine

## 2015-04-08 VITALS — BP 108/70 | HR 94 | Temp 97.6°F | Ht 65.0 in | Wt 205.0 lb

## 2015-04-08 DIAGNOSIS — E119 Type 2 diabetes mellitus without complications: Secondary | ICD-10-CM | POA: Insufficient documentation

## 2015-04-08 DIAGNOSIS — R519 Headache, unspecified: Secondary | ICD-10-CM

## 2015-04-08 DIAGNOSIS — G8929 Other chronic pain: Secondary | ICD-10-CM

## 2015-04-08 DIAGNOSIS — R51 Headache: Secondary | ICD-10-CM

## 2015-04-08 HISTORY — DX: Headache, unspecified: R51.9

## 2015-04-08 LAB — BAYER DCA HB A1C WAIVED: HB A1C: 7.5 % — AB (ref <7.0)

## 2015-04-08 MED ORDER — EMPAGLIFLOZIN 25 MG PO TABS: 25.0000 mg | ORAL_TABLET | Freq: Every day | ORAL | Status: DC

## 2015-04-08 MED ORDER — CYCLOBENZAPRINE HCL 10 MG PO TABS: ORAL_TABLET | ORAL | Status: DC

## 2015-04-08 NOTE — Progress Notes (Signed)
BP 108/70 mmHg  Pulse 94  Temp(Src) 97.6 F (36.4 C)  Ht 5\' 5"  (1.651 m)  Wt 205 lb (92.987 kg)  BMI 34.11 kg/m2  SpO2 99%   Subjective:    Patient ID: Leslie Olsen, female    DOB: Jul 31, 1951, 64 y.o.   MRN: [id number]  HPI: Shanece Olsen is a 64 y.o. female  Chief Complaint  Patient presents with  . Diabetes  . Headache   Has had been getting bad headaches in the back of her head. Stabbing. Have been more often and have been lasting longer. Has had this for years, but it has been getting worse. Nothing is helping with it. Nothing lessened it. Comes on pretty suddenly, then goes away quickly. Doesn't have a specific time that they seem to come on. She was afraid that it was a blood pressure issue, but that has been fine. Has been really on and off. She feels like her eyes have pressure in them. Has not seen his her eye doctor in a year or 2. Sometimes happens with movement, sometimes when she is completely still. No changes in her vision, just feels pressure in her head.  DIABETES Hypoglycemic episodes:no Polydipsia/polyuria: no Visual disturbance: no Chest pain: no Paresthesias: no Glucose Monitoring: no Taking Insulin?: no Blood Pressure Monitoring: a few times a week Retinal Examination: Not up to Date Foot Exam: Up to Date Diabetic Education: Completed Pneumovax: Up to Date Influenza: Up to Date Aspirin: no  Relevant past medical, surgical, family and social history reviewed and updated as indicated. Interim medical history since our last visit reviewed. Allergies and medications reviewed and updated.  Review of Systems  Constitutional: Negative.   Respiratory: Negative.   Cardiovascular: Negative.   Gastrointestinal: Negative.   Musculoskeletal: Negative.   Psychiatric/Behavioral: Negative.    Per HPI unless specifically indicated above     Objective:    BP 108/70 mmHg  Pulse 94  Temp(Src) 97.6 F (36.4 C)  Ht 5\' 5"  (1.651 m)  Wt 205 lb  (92.987 kg)  BMI 34.11 kg/m2  SpO2 99%  Wt Readings from Last 3 Encounters:  04/08/15 205 lb (92.987 kg)  01/14/15 201 lb 15.1 oz (91.6 kg)  12/20/14 206 lb 9.1 oz (93.7 kg)    Physical Exam  Constitutional: She is oriented to person, place, and time. She appears well-developed and well-nourished. No distress.  HENT:  Head: Normocephalic and atraumatic.  Right Ear: Hearing normal.  Left Ear: Hearing normal.  Nose: Nose normal.  Eyes: Conjunctivae and lids are normal. Right eye exhibits no discharge. Left eye exhibits no discharge. No scleral icterus.  Cardiovascular: Normal rate, regular rhythm and normal heart sounds.  Exam reveals no gallop and no friction rub.   No murmur heard. Pulmonary/Chest: Effort normal and breath sounds normal. No respiratory distress. She has no wheezes. She has no rales. She exhibits no tenderness.  Abdominal: Soft. Bowel sounds are normal. She exhibits no distension and no mass. There is no tenderness. There is no rebound and no guarding.  Musculoskeletal: Normal range of motion.  Hypertonic suboccipital and paraspinal cervical muscles. Tender to palpation of occiput  Neurological: She is alert and oriented to person, place, and time.  Skin: Skin is warm, dry and intact. No rash noted. No erythema. No pallor.  Psychiatric: She has a normal mood and affect. Her speech is normal and behavior is normal. Judgment and thought content normal. Cognition and memory are normal.  Nursing note and vitals reviewed.  Results for orders placed or performed in visit on 01/14/15  CBC with Differential  Result Value Ref Range   WBC 5.3 3.6 - 11.0 K/uL   RBC 4.01 3.80 - 5.20 MIL/uL   Hemoglobin 11.8 (L) 12.0 - 16.0 g/dL   HCT 35.8 35.0 - 47.0 %   MCV 89.2 80.0 - 100.0 fL   MCH 29.5 26.0 - 34.0 pg   MCHC 33.0 32.0 - 36.0 g/dL   RDW 13.8 11.5 - 14.5 %   Platelets 368 150 - 440 K/uL   Neutrophils Relative % 49 %   Neutro Abs 2.6 1.4 - 6.5 K/uL   Lymphocytes  Relative 38 %   Lymphs Abs 2.0 1.0 - 3.6 K/uL   Monocytes Relative 8 %   Monocytes Absolute 0.4 0.2 - 0.9 K/uL   Eosinophils Relative 4 %   Eosinophils Absolute 0.2 0 - 0.7 K/uL   Basophils Relative 1 %   Basophils Absolute 0.1 0 - 0.1 K/uL  Ferritin  Result Value Ref Range   Ferritin 15 11 - 307 ng/mL      Assessment & Plan:   Problem List Items Addressed This Visit      Endocrine   Diabetes mellitus without complication - Primary    A1c came back today at 7.5- up from 7.3 last visit. Doing well on the jardiance. Will increase to 25mg  daily. Follow up in 3 months. Advised her to go back to see her eye doctor for diabetic eval. Continue to monitor.       Relevant Medications   lisinopril (PRINIVIL,ZESTRIL) 5 MG tablet   empagliflozin (JARDIANCE) 25 MG TABS tablet   Other Relevant Orders   Bayer DCA Hb A1c Waived     Other   Cephalalgia    Chronic. Possibly due to her muscle spasms. Will try short term muscle relaxer- if better, will do PT. Would like to get back in to see neurology, which she has seen previously. Referral generated today.       Relevant Medications   cyclobenzaprine (FLEXERIL) 10 MG tablet   Other Relevant Orders   Ambulatory referral to Neurology       Follow up plan: Return in about 3 months (around 07/09/2015).

## 2015-04-08 NOTE — Patient Instructions (Signed)

## 2015-04-08 NOTE — Assessment & Plan Note (Signed)
Chronic. Possibly due to her muscle spasms. Will try short term muscle relaxer- if better, will do PT. Would like to get back in to see neurology, which she has seen previously. Referral generated today.

## 2015-04-08 NOTE — Assessment & Plan Note (Addendum)
A1c came back today at 7.5- up from 7.3 last visit. Doing well on the jardiance. Will increase to 25mg  daily. Follow up in 3 months. Advised her to go back to see her eye doctor for diabetic eval. Continue to monitor.

## 2015-04-18 ENCOUNTER — Ambulatory Visit: Payer: BLUE CROSS/BLUE SHIELD | Admitting: Hematology and Oncology

## 2015-04-18 ENCOUNTER — Other Ambulatory Visit: Payer: BLUE CROSS/BLUE SHIELD

## 2015-04-18 NOTE — Telephone Encounter (Signed)
  Patient was to follow-up around 04/16/2015.  Looks like follow-up around the end of 05/2015 per chart. At last phone note, by [facility] on 01/15/2015 had extreme fatigue although hematocrit was 35.8.  Ferritin was only 15 (normal 100-400).  Can we give her a call and see how she is doing and ensure ok to wait until end of 05/2015?  She may have recent labs that we are not aware.  M

## 2015-04-19 NOTE — Telephone Encounter (Signed)
Called to check on pt and left of HIPPA appropriate message for pt to return my phone call regarding fatigue.

## 2015-04-22 ENCOUNTER — Telehealth: Payer: Self-pay | Admitting: Family Medicine

## 2015-04-22 ENCOUNTER — Telehealth: Payer: Self-pay | Admitting: *Deleted

## 2015-04-22 NOTE — Telephone Encounter (Signed)
Called to check in on Leslie Olsen about her headaches to see how she did with her muscle relaxer. LMOM for her to call back.

## 2015-04-22 NOTE — Telephone Encounter (Signed)
Patient stopped in the office today. Head not better. Advised her to follow up with neurology as recommended.

## 2015-04-22 NOTE — Telephone Encounter (Signed)
Patient called with a really bad headache she has not been seen since 2/18 16 for voice changes pa was scheduled for a follow up how ever she would like to speak with  A nurse about her headache Call back number [phone]

## 2015-04-22 NOTE — Telephone Encounter (Signed)
Left message on machine for patient to call back.

## 2015-04-22 NOTE — Telephone Encounter (Signed)
Patient never returned my call. Please follow up.

## 2015-04-24 NOTE — Telephone Encounter (Signed)
Left message for patient to call me back if she still needs to discuss her headache.

## 2015-04-25 ENCOUNTER — Telehealth: Payer: Self-pay | Admitting: *Deleted

## 2015-04-25 ENCOUNTER — Ambulatory Visit (INDEPENDENT_AMBULATORY_CARE_PROVIDER_SITE_OTHER): Payer: PPO | Admitting: Neurology

## 2015-04-25 ENCOUNTER — Encounter: Payer: Self-pay | Admitting: Neurology

## 2015-04-25 VITALS — BP 128/88 | HR 89 | Ht 65.0 in | Wt 203.4 lb

## 2015-04-25 DIAGNOSIS — R51 Headache: Secondary | ICD-10-CM | POA: Diagnosis not present

## 2015-04-25 DIAGNOSIS — R519 Headache, unspecified: Secondary | ICD-10-CM

## 2015-04-25 DIAGNOSIS — G4485 Primary stabbing headache: Secondary | ICD-10-CM

## 2015-04-25 MED ORDER — PREDNISONE 10 MG PO TABS: ORAL_TABLET | ORAL | Status: DC

## 2015-04-25 MED ORDER — TOPIRAMATE 25 MG PO TABS: ORAL_TABLET | ORAL | Status: DC

## 2015-04-25 NOTE — Progress Notes (Signed)
Follow-up Visit   Date: 04/25/2015    Leslie Olsen MRN: [id number] DOB: 1950/11/11   Interim History: Leslie Olsen is a 64 y.o. right-handed Caucasian female with COPD, OSA on CPAP, GERD, and Graves disease s/p radioactive iodine treatment with secondary hypothyroidism returning to the clinic for follow-up of headaches.  The patient was accompanied to the clinic by self.  History of present illness (initial visit 09/27/2013): Since early 2014, she developed intermittent and fluctuating hoarseness of her voice. She also has a sensation as if there is something in her neck that closes up and as if she is unable to get enough air. Symptoms fluctuate and she can gave good days and bad days. She denies any diurnal symptoms or improvement with rest. She has noticed intermittent spells of choking with solids and liquids. She feels tired and rest does not overcome it. Denies double vision or weakness of the arms or legs. Over the past three weeks, she feels that her symptoms have progressively worsened. She has previously seen ENT in Spring 2014 and they suggested that her symptoms may be due to GERD. Reflex medications were adjusted, but there has been no change in her symptoms. She started using a CPAP in April 2014. She is fearful that she may stop breathing in the night.   UPDATE 04/25/2015:  For the past several months, she had starting having sharp pain involving the back of her head. Initially, it was sporadic and intermittent, lasting only a few seconds.  However, over the past several weeks these spells are becoming more constant. She does not have radiating pain into her head.  It was progressively much worse last week.  She has tried tylenol, heat, and muscle relaxant which did not help.   No photophobia, phonophobia, nausea, and vomiting. She does not have associated tearing, rhinorrhea, or conjunctival injection.   Her speech is much better overall, but reports having  intermittent spells.    Medications:  Current Outpatient Prescriptions on File Prior to Visit  Medication Sig Dispense Refill  . albuterol (PROVENTIL HFA;VENTOLIN HFA) 108 (90 BASE) MCG/ACT inhaler Inhale 2 puffs into the lungs every 6 (six) hours as needed for wheezing.    Marland Kitchen aspirin 81 MG tablet Take 81 mg by mouth daily.    . empagliflozin (JARDIANCE) 25 MG TABS tablet Take 25 mg by mouth daily. 30 tablet 6  . esomeprazole (NEXIUM) 20 MG capsule Take 20 mg by mouth daily before breakfast.    . ferrous fumarate (HEMOCYTE - 106 MG FE) 325 (106 FE) MG TABS tablet Take 1 tablet by mouth daily.    Marland Kitchen levothyroxine (SYNTHROID, LEVOTHROID) 88 MCG tablet Take 88 mcg by mouth daily.     Marland Kitchen lisinopril (PRINIVIL,ZESTRIL) 5 MG tablet Take 5 mg by mouth daily.    . Omega-3 Fatty Acids (FISH OIL) 1200 MG CAPS Take 1 capsule by mouth daily.    . Red Yeast Rice 600 MG CAPS Take 2 capsules by mouth daily.    . SYMBICORT 160-4.5 MCG/ACT inhaler INHALE TWO PUFFS INTO THE LUNGS TWICE DAILY 1 Inhaler 5   No current facility-administered medications on file prior to visit.    Allergies: No Known Allergies  Review of Systems:  CONSTITUTIONAL: No fevers, chills, night sweats, or weight loss.  EYES: No visual changes or eye pain ENT: No hearing changes.  No history of nose bleeds.   RESPIRATORY: No cough, wheezing and shortness of breath.   CARDIOVASCULAR: Negative for chest pain,  and palpitations.   GI: Negative for abdominal discomfort, blood in stools or black stools.  No recent change in bowel habits.   GU:  No history of incontinence.   MUSCLOSKELETAL: No history of joint pain or swelling.  No myalgias.   SKIN: Negative for lesions, rash, and itching.   ENDOCRINE: Negative for cold or heat intolerance, polydipsia or goiter.   PSYCH:  + depression or anxiety symptoms.   NEURO: As Above.   Vital Signs:  BP 128/88 mmHg  Pulse 89  Ht 5\' 5"  (1.651 m)  Wt 203 lb 7 oz (92.279 kg)  BMI 33.85 kg/m2   SpO2 99%  Neurological Exam: MENTAL STATUS including orientation to time, place, person, recent and remote memory, attention span and concentration, language, and fund of knowledge is normal.  Speech is not dysarthric. Lingual and gutteral sounds intact.  CRANIAL NERVES: No visual field defects.  Pupils equal round and reactive to light.  Normal conjugate, extra-ocular eye movements in all directions of gaze.  No ptosis. Normal facial sensation.  Face is symmetric. Palate elevates symmetrically.  Tongue is midline.  There is no tenderness to palpation over the GON or LON bilaterally.    MOTOR:  Motor strength is 5/5 in all extremities.  No atrophy, fasciculations or abnormal movements.  No pronator drift.  Tone is normal.    MSRs:  Right                                                                 Left brachioradialis 1+  brachioradialis 1+  Biceps 1+  Biceps 1+  triceps 1+  triceps 1+  patellar 1+  Patellar 1+  ankle jerk 0  ankle jerk 0  Hoffman no  Hoffman no  plantar response down  plantar response down   SENSORY: Vibration is reduced to 30% at the ankles bilaterally.  COORDINATION/GAIT:  Normal finger-to- nose-finger.  Intact rapid alternating movements bilaterally.  Gait narrow based and stable.   Data: EMG of the left upper and lower extremity 10/25/2014:  This is a normal study of the left upper and lower extremity.   In particular, there is no evidence of a neuromuscular junction disorder, myopathy, or cervical/lumbar radiculopathy.  Labs 09/27/2013:  CK 78, aldolase 5.0, AChR binding, blocking, modulating antibody negative  MRI brain wwo contrast 10/06/2013:  Normal MRI of the head with contrast.  Mild chronic sinusitis.   IMPRESSION/PLAN: 1.  Primary stabbing headache/icepick headaches, chronic   - Start prednisone taper pack, encouraged to follow blood glucose closely  - Start topiramate 25mg  for two weeks, then increase to 50mg  daily  - Neck PT declined  2.  Fluctuating change in voice, improved  - Extensive evaluation with EMG, MRI brain, and AChR antibodies negative  - Likely related to GERD  3. Distal and symmetric peripheral neuropathy, risk factors:  Diabetes and thyroid disease  - Asymptomatic at this time  Return to clinic in 2-3 months   The duration of this appointment visit was 30 minutes of face-to-face time with the patient.  Greater than 50% of this time was spent in counseling, explanation of diagnosis, planning of further management, and coordination of care.   Thank you for allowing me to participate in patient's care.  If I can answer  any additional questions, I would be pleased to do so.    Sincerely,    Cannie Muckle K. Posey Pronto, DO

## 2015-04-25 NOTE — Patient Instructions (Addendum)
1. Start prednisone taper pack, encouraged to follow blood glucose closely  Take 6 tablets on first day, 5 tablets on 2nd day, and continue to reduce to 10mg  each day 2. Start topiramate 25mg  for two weeks, then increase to 50mg  daily 3. Return to clinic 2-3 months

## 2015-04-25 NOTE — Telephone Encounter (Signed)
Patient called concerned about taking the metformin and topamax together.  Instructed her to only take 25 mg of the topamax per Dr. Posey Pronto.

## 2015-05-15 NOTE — Progress Notes (Signed)
This encounter was created in error - please disregard.

## 2015-05-27 ENCOUNTER — Ambulatory Visit: Payer: BLUE CROSS/BLUE SHIELD | Admitting: Neurology

## 2015-05-29 ENCOUNTER — Ambulatory Visit: Payer: BLUE CROSS/BLUE SHIELD | Admitting: Neurology

## 2015-06-07 ENCOUNTER — Ambulatory Visit: Payer: BLUE CROSS/BLUE SHIELD | Admitting: Hematology and Oncology

## 2015-06-07 ENCOUNTER — Other Ambulatory Visit: Payer: BLUE CROSS/BLUE SHIELD

## 2015-07-11 ENCOUNTER — Encounter: Payer: Self-pay | Admitting: Family Medicine

## 2015-07-11 ENCOUNTER — Ambulatory Visit: Payer: PPO | Admitting: Family Medicine

## 2015-07-11 ENCOUNTER — Ambulatory Visit (INDEPENDENT_AMBULATORY_CARE_PROVIDER_SITE_OTHER): Payer: PPO | Admitting: Family Medicine

## 2015-07-11 VITALS — BP 150/80 | HR 103 | Temp 98.1°F | Wt 201.0 lb

## 2015-07-11 DIAGNOSIS — Z113 Encounter for screening for infections with a predominantly sexual mode of transmission: Secondary | ICD-10-CM | POA: Diagnosis not present

## 2015-07-11 DIAGNOSIS — I10 Essential (primary) hypertension: Secondary | ICD-10-CM | POA: Diagnosis not present

## 2015-07-11 DIAGNOSIS — E119 Type 2 diabetes mellitus without complications: Secondary | ICD-10-CM

## 2015-07-11 DIAGNOSIS — E785 Hyperlipidemia, unspecified: Secondary | ICD-10-CM | POA: Diagnosis not present

## 2015-07-11 LAB — LP+ALT+AST PICCOLO, WAIVED
ALT (SGPT) Piccolo, Waived: 23 U/L (ref 10–47)
AST (SGOT) PICCOLO, WAIVED: 28 U/L (ref 11–38)

## 2015-07-11 LAB — MICROALBUMIN, URINE WAIVED
CREATININE, URINE WAIVED: 200 mg/dL (ref 10–300)
Microalb, Ur Waived: 80 mg/L — ABNORMAL HIGH (ref 0–19)

## 2015-07-11 LAB — BAYER DCA HB A1C WAIVED: HB A1C: 6.7 % (ref <7.0)

## 2015-07-11 MED ORDER — METFORMIN HCL 500 MG PO TABS: 1000.0000 mg | ORAL_TABLET | Freq: Two times a day (BID) | ORAL | Status: DC

## 2015-07-11 MED ORDER — LEVOTHYROXINE SODIUM 88 MCG PO TABS: 88.0000 ug | ORAL_TABLET | Freq: Every day | ORAL | Status: DC

## 2015-07-11 MED ORDER — GLUCOSE BLOOD VI STRP: ORAL_STRIP | Status: DC

## 2015-07-11 MED ORDER — LISINOPRIL 5 MG PO TABS: 5.0000 mg | ORAL_TABLET | Freq: Every day | ORAL | Status: DC

## 2015-07-11 NOTE — Assessment & Plan Note (Signed)
Lipemic today. Will await final results. Will treat as needed.

## 2015-07-11 NOTE — Assessment & Plan Note (Signed)
BP quite elevated today. Not usually elevated. Will monitor at home and recheck next visit. Will restart lisinopril for renal protection as well as BP control. Continue to monitor.

## 2015-07-11 NOTE — Assessment & Plan Note (Signed)
A1c improving. Jardiance too expensive. Doing OK on metformin. Will continue metformin. Continue to monitor.

## 2015-07-11 NOTE — Progress Notes (Signed)
BP 150/80 mmHg  Pulse 103  Temp(Src) 98.1 F (36.7 C)  Wt 201 lb (91.173 kg)  SpO2 97%   Subjective:    Patient ID: Leslie Olsen, female    DOB: [birth year], 64 y.o.   MRN: [id number]  HPI: Leslie Olsen is a 64 y.o. female  Chief Complaint  Patient presents with  . Diabetes    Patient is still taking the metformin, she can not afford the Jardiance  . Hypertension  . Hyperlipidemia   Headaches have been a problem. Didn't like the neurologist in [place]. Saw the neurologist at [facility], saw Dr. Melrose Nakayama on Monday. Feeling much better now. Did not do well with the topamax. Doing well on the fiorect.   DIABETES- diarrhea from the metformin has gotten better.  Hypoglycemic episodes:no Polydipsia/polyuria: no Visual disturbance: no Chest pain: no Paresthesias: no Glucose Monitoring: yes  Accucheck frequency: Daily  Fasting glucose: 159 Blood Pressure Monitoring: not checking Retinal Examination: Not up to Date Foot Exam: Up to Date Diabetic Education: Completed Pneumovax: Up to Date Influenza: Up to Date Aspirin: no  HYPERTENSION / HYPERLIPIDEMIA Satisfied with current treatment? yes Duration of hypertension: off and on BP monitoring frequency: 3-4 times a week BP medication side effects: no Past BP meds: none Duration of hyperlipidemia: chronic Cholesterol medication side effects: not on anything Cholesterol supplements: fish oil, niacin and red yeast rice Past cholesterol medications: none Medication compliance: not on anything Aspirin: yes Recent stressors: no Recurrent headaches: yes Visual changes: no Palpitations: no Dyspnea: no Chest pain: no Lower extremity edema: no Dizzy/lightheaded: no  Relevant past medical, surgical, family and social history reviewed and updated as indicated. Interim medical history since our last visit reviewed. Allergies and medications reviewed and updated.  Review of Systems  Constitutional: Negative.   Respiratory:  Negative.   Cardiovascular: Negative.   Gastrointestinal: Negative.   Endocrine: Negative.   Musculoskeletal: Negative.   Psychiatric/Behavioral: Negative.     Per HPI unless specifically indicated above     Objective:    BP 150/80 mmHg  Pulse 103  Temp(Src) 98.1 F (36.7 C)  Wt 201 lb (91.173 kg)  SpO2 97%  Wt Readings from Last 3 Encounters:  07/11/15 201 lb (91.173 kg)  04/25/15 203 lb 7 oz (92.279 kg)  04/08/15 205 lb (92.987 kg)    Physical Exam  Constitutional: She is oriented to person, place, and time. She appears well-developed and well-nourished. No distress.  HENT:  Head: Normocephalic and atraumatic.  Right Ear: Hearing normal.  Left Ear: Hearing normal.  Nose: Nose normal.  Eyes: Conjunctivae, EOM and lids are normal. Pupils are equal, round, and reactive to light. Right eye exhibits no discharge. Left eye exhibits no discharge. No scleral icterus.  Neck: Normal range of motion. Neck supple. No JVD present. No tracheal deviation present. No thyromegaly present.  Cardiovascular: Normal rate, regular rhythm, normal heart sounds and intact distal pulses.  Exam reveals no gallop and no friction rub.   No murmur heard. Pulmonary/Chest: Effort normal and breath sounds normal. No stridor. No respiratory distress. She has no wheezes. She has no rales. She exhibits no tenderness.  Musculoskeletal: Normal range of motion.  Lymphadenopathy:    She has no cervical adenopathy.  Neurological: She is alert and oriented to person, place, and time.  Skin: Skin is warm, dry and intact. No rash noted. She is not diaphoretic. No erythema. No pallor.  Psychiatric: She has a normal mood and affect. Her speech is normal and behavior  is normal. Judgment and thought content normal. Cognition and memory are normal.  Nursing note and vitals reviewed.   Results for orders placed or performed in visit on 07/11/15  Bayer DCA Hb A1c Waived  Result Value Ref Range   Bayer DCA Hb A1c  Waived 6.7 <7.0 %  LP+ALT+AST Piccolo, Waived  Result Value Ref Range   ALT (SGPT) Piccolo, Waived 23 10 - 47 U/L   AST (SGOT) Piccolo, Waived 28 11 - 38 U/L   Cholesterol Piccolo, Waived CANCELED    HDL Chol Piccolo, Waived CANCELED    Triglycerides Piccolo,Waived CANCELED   Microalbumin, Urine Waived  Result Value Ref Range   Microalb, Ur Waived 80 (H) 0 - 19 mg/L   Creatinine, Urine Waived 200 10 - 300 mg/dL   Microalb/Creat Ratio 30-300 (H) <30 mg/g      Assessment & Plan:   Problem List Items Addressed This Visit      Cardiovascular and Mediastinum   Essential hypertension    BP quite elevated today. Not usually elevated. Will monitor at home and recheck next visit. Will restart lisinopril for renal protection as well as BP control. Continue to monitor.       Relevant Medications   lisinopril (PRINIVIL,ZESTRIL) 5 MG tablet   Other Relevant Orders   Microalbumin, Urine Waived (Completed)     Endocrine   Diabetes mellitus without complication ([place]) - Primary    A1c improving. Jardiance too expensive. Doing OK on metformin. Will continue metformin. Continue to monitor.       Relevant Medications   lisinopril (PRINIVIL,ZESTRIL) 5 MG tablet   metFORMIN (GLUCOPHAGE) 500 MG tablet   Other Relevant Orders   Bayer DCA Hb A1c Waived (Completed)   Basic metabolic panel     Other   Hyperlipidemia    Lipemic today. Will await final results. Will treat as needed.      Relevant Medications   lisinopril (PRINIVIL,ZESTRIL) 5 MG tablet   Other Relevant Orders   LP+ALT+AST Piccolo, Waived (Completed)    Other Visit Diagnoses    Routine screening for STI (sexually transmitted infection)        Hep C checked today. Await results.     Relevant Orders    Hepatitis C antibody        Follow up plan: Return in about 3 months (around 10/11/2015) for DM follow up.

## 2015-07-12 ENCOUNTER — Encounter: Payer: Self-pay | Admitting: Family Medicine

## 2015-07-12 LAB — BASIC METABOLIC PANEL
BUN / CREAT RATIO: 22 (ref 11–26)
BUN: 14 mg/dL (ref 8–27)
CALCIUM: 9.2 mg/dL (ref 8.7–10.3)
CHLORIDE: 99 mmol/L (ref 97–106)
CO2: 22 mmol/L (ref 18–29)
Creatinine, Ser: 0.65 mg/dL (ref 0.57–1.00)
GFR calc Af Amer: 107 mL/min/{1.73_m2} (ref >59)
GFR calc non Af Amer: 93 mL/min/{1.73_m2} (ref >59)
GLUCOSE: 170 mg/dL — AB (ref 65–99)
POTASSIUM: 4.1 mmol/L (ref 3.5–5.2)
Sodium: 139 mmol/L (ref 136–144)

## 2015-07-12 LAB — HEPATITIS C ANTIBODY

## 2015-07-13 LAB — LIPID PANEL W/O CHOL/HDL RATIO
CHOLESTEROL TOTAL: 244 mg/dL — AB (ref 100–199)
HDL: 40 mg/dL (ref >39)
Triglycerides: 451 mg/dL — ABNORMAL HIGH (ref 0–149)

## 2015-07-13 LAB — SPECIMEN STATUS REPORT

## 2015-07-15 ENCOUNTER — Telehealth: Payer: Self-pay | Admitting: Family Medicine

## 2015-07-15 MED ORDER — METFORMIN HCL 500 MG PO TABS: 500.0000 mg | ORAL_TABLET | Freq: Two times a day (BID) | ORAL | Status: DC

## 2015-07-15 MED ORDER — LEVOTHYROXINE SODIUM 88 MCG PO TABS: 88.0000 ug | ORAL_TABLET | Freq: Every day | ORAL | Status: DC

## 2015-07-15 NOTE — Telephone Encounter (Signed)
Called with lab results. Patient ate before lab draw, which was why triglycerides so high. Will recheck next visit when she is fasting.

## 2015-07-17 ENCOUNTER — Telehealth: Payer: Self-pay | Admitting: Family Medicine

## 2015-07-17 DIAGNOSIS — E119 Type 2 diabetes mellitus without complications: Secondary | ICD-10-CM

## 2015-07-17 MED ORDER — GLUCOSE BLOOD VI STRP: ORAL_STRIP | Status: DC

## 2015-07-17 NOTE — Telephone Encounter (Signed)
New strips sent over

## 2015-07-17 NOTE — Telephone Encounter (Signed)
Pt stated the test strips she received were not the right ones and she needs the one touch berio sent in to [facility] [place]

## 2015-07-22 ENCOUNTER — Other Ambulatory Visit: Payer: Self-pay | Admitting: Family Medicine

## 2015-07-22 DIAGNOSIS — Z1231 Encounter for screening mammogram for malignant neoplasm of breast: Secondary | ICD-10-CM

## 2015-07-26 ENCOUNTER — Ambulatory Visit: Payer: PPO | Admitting: Neurology

## 2015-09-05 ENCOUNTER — Ambulatory Visit: Payer: PPO

## 2015-09-19 ENCOUNTER — Ambulatory Visit
Admission: RE | Admit: 2015-09-19 | Discharge: 2015-09-19 | Disposition: A | Discharge status: Home / Self Care | Payer: PPO | Source: Ambulatory Visit | Attending: Family Medicine | Admitting: Family Medicine

## 2015-09-19 DIAGNOSIS — Z1231 Encounter for screening mammogram for malignant neoplasm of breast: Secondary | ICD-10-CM | POA: Diagnosis not present

## 2015-09-20 ENCOUNTER — Encounter: Payer: Self-pay | Admitting: Family Medicine

## 2015-10-11 ENCOUNTER — Encounter: Payer: Self-pay | Admitting: Family Medicine

## 2015-10-11 ENCOUNTER — Ambulatory Visit (INDEPENDENT_AMBULATORY_CARE_PROVIDER_SITE_OTHER): Payer: PPO | Admitting: Family Medicine

## 2015-10-11 ENCOUNTER — Other Ambulatory Visit: Payer: Self-pay | Admitting: Family Medicine

## 2015-10-11 VITALS — BP 114/71 | HR 96 | Temp 98.3°F

## 2015-10-11 DIAGNOSIS — E119 Type 2 diabetes mellitus without complications: Secondary | ICD-10-CM

## 2015-10-11 DIAGNOSIS — R5383 Other fatigue: Secondary | ICD-10-CM

## 2015-10-11 DIAGNOSIS — M5481 Occipital neuralgia: Secondary | ICD-10-CM | POA: Insufficient documentation

## 2015-10-11 LAB — BAYER DCA HB A1C WAIVED: HB A1C (BAYER DCA - WAIVED): 7.2 % — ABNORMAL HIGH (ref <7.0)

## 2015-10-11 MED ORDER — GABAPENTIN 300 MG PO CAPS: 300.0000 mg | ORAL_CAPSULE | Freq: Three times a day (TID) | ORAL | Status: DC

## 2015-10-11 NOTE — Assessment & Plan Note (Signed)
A1c up to 7.2 because she hasn't been paying attention to her sugars because her head hurts so much. Will work on diet and recheck in 3 months.

## 2015-10-11 NOTE — Assessment & Plan Note (Signed)
Not doing well. Will increase her gabapentin to 300mg  TID. Will have her continue to follow with neurology. Will get her in to see PT for her neck for evaluation to see if it can help. Check back in in 1 month.

## 2015-10-11 NOTE — Progress Notes (Signed)
BP 114/71 mmHg  Pulse 96  Temp(Src) 98.3 F (36.8 C)  SpO2 97%   Subjective:    Patient ID: Leslie Olsen, female    DOB: 1950-09-17, 65 y.o.   MRN: [id number]  HPI: Leslie Olsen is a 65 y.o. female  Chief Complaint  Patient presents with  . Diabetes  . Fatigue  . Headache    Neuro states that she has occipital neuroalgia   She states that she doesn't feel good. Her headaches are not any better and she just started taking sumatriptan and gabapentin. She is supposed to see him again April. She has been craving salt. She notes that this has been going on for several weeks. She has called her neurologist and she is feeling not good with it. She notes that she is taking up to 800mg  of her gabapentin a day. She states that today, her head is "flash and stop", but it was constant for several days previously. She notes that the head is worse. She went to see a Restaurant manager, fast food. Hasn't tried any physical therapy. Would consider trying it. Has not been paying attention to her sugar because her head is hurting so bad.   DIABETES Hypoglycemic episodes:no Polydipsia/polyuria: no Visual disturbance: no Chest pain: no Paresthesias: no Glucose Monitoring: yes  Accucheck frequency: occasionally Taking Insulin?: no Blood Pressure Monitoring: not checking Retinal Examination: Not up to Date Foot Exam: Up to Date Diabetic Education: Not Completed Pneumovax: Up to Date Influenza: Up to Date Aspirin: no  Relevant past medical, surgical, family and social history reviewed and updated as indicated. Interim medical history since our last visit reviewed. Allergies and medications reviewed and updated.  Review of Systems  Constitutional: Negative.   Respiratory: Negative.   Cardiovascular: Negative.   Musculoskeletal: Negative.   Neurological: Positive for headaches. Negative for dizziness, tremors, seizures, syncope, facial asymmetry, speech difficulty, weakness, light-headedness and  numbness.  Psychiatric/Behavioral: Negative.     Per HPI unless specifically indicated above     Objective:    BP 114/71 mmHg  Pulse 96  Temp(Src) 98.3 F (36.8 C)  SpO2 97%  Wt Readings from Last 3 Encounters:  07/11/15 201 lb (91.173 kg)  04/25/15 203 lb 7 oz (92.279 kg)  04/08/15 205 lb (92.987 kg)    Physical Exam  Constitutional: She is oriented to person, place, and time. She appears well-developed and well-nourished. No distress.  HENT:  Head: Normocephalic and atraumatic.  Right Ear: Hearing normal.  Left Ear: Hearing normal.  Nose: Nose normal.  Eyes: Conjunctivae and lids are normal. Right eye exhibits no discharge. Left eye exhibits no discharge. No scleral icterus.  Cardiovascular: Normal rate, regular rhythm, normal heart sounds and intact distal pulses.  Exam reveals no gallop and no friction rub.   No murmur heard. Pulmonary/Chest: Effort normal and breath sounds normal. No respiratory distress. She has no wheezes. She has no rales. She exhibits no tenderness.  Musculoskeletal: Normal range of motion.  Neurological: She is alert and oriented to person, place, and time.  Skin: Skin is warm, dry and intact. No rash noted. No erythema. No pallor.  Psychiatric: She has a normal mood and affect. Her speech is normal and behavior is normal. Judgment and thought content normal. Cognition and memory are normal.  Nursing note and vitals reviewed.   Results for orders placed or performed in visit on 07/11/15  Bayer DCA Hb A1c Waived  Result Value Ref Range   Bayer DCA Hb A1c Waived 6.7 <7.0 %  LP+ALT+AST Piccolo, [employer]  Result Value Ref Range   ALT (SGPT) Piccolo, Waived 23 10 - 47 U/L   AST (SGOT) Piccolo, Waived 28 11 - 38 U/L   Cholesterol Piccolo, Waived CANCELED    HDL Chol Piccolo, Waived CANCELED    Triglycerides Piccolo,Waived CANCELED   Basic metabolic panel  Result Value Ref Range   Glucose 170 (H) 65 - 99 mg/dL   BUN 14 8 - 27 mg/dL   Creatinine,  Ser 0.65 0.57 - 1.00 mg/dL   GFR calc non Af Amer 93 >59 mL/min/1.73   GFR calc Af Amer 107 >59 mL/min/1.73   BUN/Creatinine Ratio 22 11 - 26   Sodium 139 136 - 144 mmol/L   Potassium 4.1 3.5 - 5.2 mmol/L   Chloride 99 97 - 106 mmol/L   CO2 22 18 - 29 mmol/L   Calcium 9.2 8.7 - 10.3 mg/dL  Microalbumin, Urine Waived  Result Value Ref Range   Microalb, Ur Waived 80 (H) 0 - 19 mg/L   Creatinine, Urine Waived 200 10 - 300 mg/dL   Microalb/Creat Ratio 30-300 (H) <30 mg/g  Hepatitis C antibody  Result Value Ref Range   Hep C Virus Ab <0.1 0.0 - 0.9 s/co ratio  Specimen status report  Result Value Ref Range   specimen status report Comment   Lipid Panel w/o Chol/HDL Ratio  Result Value Ref Range   Cholesterol, Total 244 (H) 100 - 199 mg/dL   Triglycerides 451 (H) 0 - 149 mg/dL   HDL 40 >39 mg/dL   VLDL Cholesterol Cal Comment 5 - 40 mg/dL   LDL Calculated Comment 0 - 99 mg/dL      Assessment & Plan:   Problem List Items Addressed This Visit      Endocrine   Diabetes mellitus without complication ([facility])    123456 up to 7.2 because she hasn't been paying attention to her sugars because her head hurts so much. Will work on diet and recheck in 3 months.         Other   Occipital neuralgia - Primary    Not doing well. Will increase her gabapentin to 300mg  TID. Will have her continue to follow with neurology. Will get her in to see PT for her neck for evaluation to see if it can help. Check back in in 1 month.        Other Visit Diagnoses    Other fatigue        Will check labs to see if there is any cause. Likely due to dealing with her headaches and the gabapentin. Continue to montior. Await results.     Relevant Orders    CBC    TSH    Vitamin D (25 hydroxy)    Comprehensive metabolic panel        Follow up plan: Return in about 4 weeks (around 11/08/2015) for follow up headaches.

## 2015-10-12 LAB — CBC
HEMATOCRIT: 29.8 % — AB (ref 34.0–46.6)
HEMOGLOBIN: 9.5 g/dL — AB (ref 11.1–15.9)
MCH: 24.5 pg — ABNORMAL LOW (ref 26.6–33.0)
MCHC: 31.9 g/dL (ref 31.5–35.7)
MCV: 77 fL — ABNORMAL LOW (ref 79–97)
Platelets: 348 10*3/uL (ref 150–379)
RBC: 3.88 x10E6/uL (ref 3.77–5.28)
RDW: 17.7 % — ABNORMAL HIGH (ref 12.3–15.4)
WBC: 5.7 10*3/uL (ref 3.4–10.8)

## 2015-10-12 LAB — COMPREHENSIVE METABOLIC PANEL
A/G RATIO: 1.5 (ref 1.1–2.5)
ALT: 12 IU/L (ref 0–32)
AST: 15 IU/L (ref 0–40)
Albumin: 4.3 g/dL (ref 3.6–4.8)
Alkaline Phosphatase: 103 IU/L (ref 39–117)
BUN / CREAT RATIO: 16 (ref 11–26)
BUN: 14 mg/dL (ref 8–27)
Bilirubin Total: <0.2 mg/dL (ref 0.0–1.2)
CALCIUM: 9.1 mg/dL (ref 8.7–10.3)
CO2: 21 mmol/L (ref 18–29)
Chloride: 104 mmol/L (ref 96–106)
Creatinine, Ser: 0.86 mg/dL (ref 0.57–1.00)
GFR, EST AFRICAN AMERICAN: 81 mL/min/{1.73_m2} (ref >59)
GFR, EST NON AFRICAN AMERICAN: 71 mL/min/{1.73_m2} (ref >59)
Globulin, Total: 2.9 g/dL (ref 1.5–4.5)
Glucose: 204 mg/dL — ABNORMAL HIGH (ref 65–99)
POTASSIUM: 4.8 mmol/L (ref 3.5–5.2)
Sodium: 141 mmol/L (ref 134–144)
TOTAL PROTEIN: 7.2 g/dL (ref 6.0–8.5)

## 2015-10-12 LAB — VITAMIN D 25 HYDROXY (VIT D DEFICIENCY, FRACTURES): VIT D 25 HYDROXY: 23.6 ng/mL — AB (ref 30.0–100.0)

## 2015-10-12 LAB — TSH: TSH: 0.385 u[IU]/mL — ABNORMAL LOW (ref 0.450–4.500)

## 2015-10-14 ENCOUNTER — Encounter: Payer: Self-pay | Admitting: Family Medicine

## 2015-10-14 ENCOUNTER — Telehealth: Payer: Self-pay | Admitting: Family Medicine

## 2015-10-14 DIAGNOSIS — E039 Hypothyroidism, unspecified: Secondary | ICD-10-CM

## 2015-10-14 MED ORDER — LEVOTHYROXINE SODIUM 75 MCG PO TABS: 75.0000 ug | ORAL_TABLET | Freq: Every day | ORAL | Status: DC

## 2015-10-14 NOTE — Telephone Encounter (Signed)
I just printed a coupon for her. Have her try that.

## 2015-10-14 NOTE — Telephone Encounter (Signed)
Patient notified

## 2015-10-14 NOTE — Telephone Encounter (Signed)
Can you let her know 2 things? Her thyroid is over treated, so I'm dropping her dose, and she is anemic again (9.5) and should call to go back to her hematologist. If she needs a new referral, let me know and I'll put one in.

## 2015-10-14 NOTE — Telephone Encounter (Signed)
Patient states that she just picked up a 90 day supply of her Synthroid that was $90.00 for 88 mcg, she states that she can not afford to buy more medication, what should she do?

## 2015-10-22 ENCOUNTER — Inpatient Hospital Stay: Payer: PPO

## 2015-10-22 ENCOUNTER — Encounter: Payer: Self-pay | Admitting: Hematology and Oncology

## 2015-10-22 ENCOUNTER — Inpatient Hospital Stay: Payer: PPO | Attending: Hematology and Oncology | Admitting: Hematology and Oncology

## 2015-10-22 VITALS — BP 148/69 | HR 101 | Temp 98.2°F | Resp 18 | Wt 203.7 lb

## 2015-10-22 DIAGNOSIS — Z79899 Other long term (current) drug therapy: Secondary | ICD-10-CM | POA: Diagnosis not present

## 2015-10-22 DIAGNOSIS — F5089 Other specified eating disorder: Secondary | ICD-10-CM | POA: Insufficient documentation

## 2015-10-22 DIAGNOSIS — R5382 Chronic fatigue, unspecified: Secondary | ICD-10-CM

## 2015-10-22 DIAGNOSIS — Z801 Family history of malignant neoplasm of trachea, bronchus and lung: Secondary | ICD-10-CM | POA: Insufficient documentation

## 2015-10-22 DIAGNOSIS — Z803 Family history of malignant neoplasm of breast: Secondary | ICD-10-CM | POA: Insufficient documentation

## 2015-10-22 DIAGNOSIS — D509 Iron deficiency anemia, unspecified: Secondary | ICD-10-CM

## 2015-10-22 DIAGNOSIS — M5481 Occipital neuralgia: Secondary | ICD-10-CM | POA: Insufficient documentation

## 2015-10-22 DIAGNOSIS — R51 Headache: Secondary | ICD-10-CM | POA: Diagnosis not present

## 2015-10-22 DIAGNOSIS — E039 Hypothyroidism, unspecified: Secondary | ICD-10-CM | POA: Diagnosis not present

## 2015-10-22 DIAGNOSIS — Z7984 Long term (current) use of oral hypoglycemic drugs: Secondary | ICD-10-CM | POA: Diagnosis not present

## 2015-10-22 DIAGNOSIS — J449 Chronic obstructive pulmonary disease, unspecified: Secondary | ICD-10-CM | POA: Insufficient documentation

## 2015-10-22 DIAGNOSIS — Z87891 Personal history of nicotine dependence: Secondary | ICD-10-CM

## 2015-10-22 DIAGNOSIS — K219 Gastro-esophageal reflux disease without esophagitis: Secondary | ICD-10-CM | POA: Diagnosis not present

## 2015-10-22 DIAGNOSIS — Z7982 Long term (current) use of aspirin: Secondary | ICD-10-CM | POA: Diagnosis not present

## 2015-10-22 LAB — CBC WITH DIFFERENTIAL/PLATELET
Basophils Absolute: 0.1 10*3/uL (ref 0–0.1)
Basophils Relative: 1 %
Eosinophils Absolute: 0.2 10*3/uL (ref 0–0.7)
Eosinophils Relative: 4 %
HCT: 30.6 % — ABNORMAL LOW (ref 35.0–47.0)
Hemoglobin: 9.6 g/dL — ABNORMAL LOW (ref 12.0–16.0)
Lymphocytes Relative: 33 %
Lymphs Abs: 2 10*3/uL (ref 1.0–3.6)
MCH: 23.7 pg — ABNORMAL LOW (ref 26.0–34.0)
MCHC: 31.4 g/dL — ABNORMAL LOW (ref 32.0–36.0)
MCV: 75.5 fL — ABNORMAL LOW (ref 80.0–100.0)
Monocytes Absolute: 0.5 10*3/uL (ref 0.2–0.9)
Monocytes Relative: 9 %
Neutro Abs: 3.3 10*3/uL (ref 1.4–6.5)
Neutrophils Relative %: 53 %
Platelets: 328 10*3/uL (ref 150–440)
RBC: 4.06 MIL/uL (ref 3.80–5.20)
RDW: 18.5 % — ABNORMAL HIGH (ref 11.5–14.5)
WBC: 6.1 10*3/uL (ref 3.6–11.0)

## 2015-10-22 LAB — IRON AND TIBC
Iron: 15 ug/dL — ABNORMAL LOW (ref 28–170)
Saturation Ratios: 3 % — ABNORMAL LOW (ref 10.4–31.8)
TIBC: 462 ug/dL — ABNORMAL HIGH (ref 250–450)
UIBC: 447 ug/dL

## 2015-10-22 LAB — FERRITIN: Ferritin: 7 ng/mL — ABNORMAL LOW (ref 11–307)

## 2015-10-22 LAB — FOLATE: Folate: 18.9 ng/mL (ref >5.9)

## 2015-10-22 NOTE — Progress Notes (Signed)
[facility] day:  10/22/2015   Chief Complaint: Leslie Olsen is an 65 y.o. female with iron deficiency anemia who seen for 9 month reassessment.  HPI: The patient was last seen in the medical oncology clinic on 01/14/2015.  At that time, she had chronic fatigue.  Exam was stable.  Labs included a hematocrit of 35.8, hemoglobin 11.8, MCV 89.2, platelets 368,000, WBC 5300 with an ANC of 2600.  Ferritin was 15.  She was to have continued her oral iron until her ferritin was > 100.  During the interim,  she has continued her iron.  She notes issues with a headache beginning in 03/2015.  She has been diiagnosed with occipital neuralgia.  She is seeing Dr. Melrose Nakayama, neurologist.  Several things have been tried including gabapentin, nortriptyline, Imitrex, and CPAP.  Nothing is helping.  She has resorted to wrapping a pad around her head.  She is being considered for an occiptal block.  She says it feels like she is biting a battery or an electrical cord.  Symptomatically, she has been fatigued, tired, and run-down.  She notes a recent history of popcorn pica.  She denies any melena, hematochezia, vaginal bleeding or hematuria.  Labs on 10/11/2015 revealed a hematocrit of 29.8, hemoglobin 9.5, MCV 77, platelets 348,000, WBC 7200.  TSH was 0.385 (low).  Her Synthroid was decreased from 88 cg to 75 mcg a day.  Past Medical History  Diagnosis Date  . COPD (chronic obstructive pulmonary disease) ([place])   . GERD (gastroesophageal reflux disease)   . Hypothyroidism   . Anemia   . Chronic headaches     Past Surgical History  Procedure Laterality Date  . Abdominal hysterectomy    . Back surgery    . Shoulder surgery    . Coronary stent placement    . Tibia fracture surgery    . Replacement total knee    . Cholecystectomy    . Reduction mammaplasty      Family History  Problem Relation Age of Onset  . Lung cancer Maternal Aunt   . Breast cancer  Maternal Aunt 57  . Other Mother     Killed in car accident, 6  . Other Father     Killed in car accident, 66  . Thyroid disease Brother   . Thyroid disease Sister     Social History:  reports that she quit smoking about 3 years ago. Her smoking use included Cigarettes. She has a 94 pack-year smoking history. She has never used smokeless tobacco. She reports that she does not drink alcohol or use illicit drugs.  She is alone today.  Allergies: No Known Allergies  Current Medications: Current Outpatient Prescriptions  Medication Sig Dispense Refill  . albuterol (PROVENTIL HFA;VENTOLIN HFA) 108 (90 BASE) MCG/ACT inhaler Inhale 2 puffs into the lungs every 6 (six) hours as needed for wheezing.    Marland Kitchen aspirin 81 MG tablet Take 81 mg by mouth daily.    Marland Kitchen esomeprazole (NEXIUM) 20 MG capsule Take 20 mg by mouth daily before breakfast.    . ferrous fumarate (HEMOCYTE - 106 MG FE) 325 (106 FE) MG TABS tablet Take 1 tablet by mouth daily.    Marland Kitchen gabapentin (NEURONTIN) 300 MG capsule Take 1 capsule (300 mg total) by mouth 3 (three) times daily. 90 capsule 3  . glucose blood (COOL BLOOD GLUCOSE TEST STRIPS) test strip Use as instructed 100 each 12  . levothyroxine (SYNTHROID, LEVOTHROID) 75  MCG tablet Take 1 tablet (75 mcg total) by mouth daily. Brand name required, come back in 6 weeks for lab recheck 30 tablet 1  . lisinopril (PRINIVIL,ZESTRIL) 5 MG tablet Take 1 tablet (5 mg total) by mouth daily. 90 tablet 1  . metFORMIN (GLUCOPHAGE) 500 MG tablet Take 1 tablet (500 mg total) by mouth 2 (two) times daily with a meal. 360 tablet 1  . Omega-3 Fatty Acids (FISH OIL) 1200 MG CAPS Take 1 capsule by mouth daily.    . Red Yeast Rice 600 MG CAPS Take 2 capsules by mouth daily.    . SUMAtriptan (IMITREX) 100 MG tablet Take 100 mg by mouth every 2 (two) hours as needed for migraine. May repeat in 2 hours if headache persists or recurs.    . SYMBICORT 160-4.5 MCG/ACT inhaler INHALE TWO PUFFS INTO THE LUNGS  TWICE DAILY 1 Inhaler 5   No current facility-administered medications for this visit.    Review of Systems:  GENERAL:  Fatigue.  Run down.  Doesn't feel good.  No fevers, sweats or weight loss. PERFORMANCE STATUS (ECOG):  2 HEENT:  No visual changes, runny nose, sore throat, mouth sores or tenderness. Lungs: No shortness of breath or cough.  No hemoptysis.  Using CPAP. Cardiac:  No chest pain, palpitations, orthopnea, or PND. GI:  No nausea, vomiting, diarrhea, constipation, melena or hematochezia.  Popcorn pica.   GU:  No urgency, frequency, dysuria, or hematuria. Musculoskeletal:  No back pain.  No joint pain.  No muscle tenderness. Extremities:  No pain or swelling. Skin:  No rashes or skin changes. Neuro:  Occipital headaches.  Feels like biting on an electrical cord or battery.  No numbness or weakness, balance or coordination issues. Endocrine:  Hypothyroid on Synthroid.  Diabetes.  No hot flashes or night sweats. Psych:  No mood changes, depression or anxiety. Pain:  No focal pain. Review of systems:  All other systems reviewed and found to be negative.   Physical Exam: Blood pressure 148/69, pulse 101, temperature 98.2 F (36.8 C), temperature source Tympanic, resp. rate 18, weight 203 lb 11.3 oz (92.4 kg).  GENERAL:  Well developed, well nourished, fatigued appearing woman sitting comfortably in the exam room in no acute distress. MENTAL STATUS:  Alert and oriented to person, place and time. HEAD:  Short gray hair.  Normocephalic, atraumatic, face symmetric, no Cushingoid features. EYES:  Blue eyes.  Pupils equal round and reactive to light and accomodation.  No conjunctivitis or scleral icterus. ENT:  Oropharynx clear without lesion.  Partial.  Tongue normal.  Mucous membranes moist.  RESPIRATORY:  Clear to auscultation without rales, wheezes or rhonchi. CARDIOVASCULAR:  Regular rate and rhythm without murmur, rub or gallop. ABDOMEN:  Soft, non-tender, with active bowel  sounds, and no appreciable hepatosplenomegaly.  No masses. SKIN:  No rashes, ulcers or lesions. EXTREMITIES: No edema, no skin discoloration or tenderness.  No palpable cords. LYMPH NODES: No palpable cervical, supraclavicular, axillary or inguinal adenopathy  NEUROLOGICAL: Unremarkable. PSYCH:  Appropriate.   Appointment on 10/22/2015  Component Date Value Ref Range Status  . WBC 10/22/2015 6.1  3.6 - 11.0 K/uL Final  . RBC 10/22/2015 4.06  3.80 - 5.20 MIL/uL Final  . Hemoglobin 10/22/2015 9.6* 12.0 - 16.0 g/dL Final  . HCT 10/22/2015 30.6* 35.0 - 47.0 % Final  . MCV 10/22/2015 75.5* 80.0 - 100.0 fL Final  . MCH 10/22/2015 23.7* 26.0 - 34.0 pg Final  . MCHC 10/22/2015 31.4* 32.0 - 36.0 g/dL  Final  . RDW 10/22/2015 18.5* 11.5 - 14.5 % Final  . Platelets 10/22/2015 328  150 - 440 K/uL Final  . Neutrophils Relative % 10/22/2015 53   Final  . Neutro Abs 10/22/2015 3.3  1.4 - 6.5 K/uL Final  . Lymphocytes Relative 10/22/2015 33   Final  . Lymphs Abs 10/22/2015 2.0  1.0 - 3.6 K/uL Final  . Monocytes Relative 10/22/2015 9   Final  . Monocytes Absolute 10/22/2015 0.5  0.2 - 0.9 K/uL Final  . Eosinophils Relative 10/22/2015 4   Final  . Eosinophils Absolute 10/22/2015 0.2  0 - 0.7 K/uL Final  . Basophils Relative 10/22/2015 1   Final  . Basophils Absolute 10/22/2015 0.1  0 - 0.1 K/uL Final  . Ferritin 10/22/2015 7* 11 - 307 ng/mL Final  . Folate 10/22/2015 18.9  >5.9 ng/mL Final  . Iron 10/22/2015 15* 28 - 170 ug/dL Final  . TIBC 10/22/2015 462* 250 - 450 ug/dL Final  . Saturation Ratios 10/22/2015 3* 10.4 - 31.8 % Final  . UIBC 10/22/2015 447   Final    Assessment:  Leslie Olsen is an 65 y.o. female with a history of iron deficiency anemia.  Colonoscopy and upper endoscopy were negative on 06/05/2014.  Her diet is good.  She denies any melena or hematochezia.  She denies any uterine bleeding.    She has a 90 pack year smoking history.  Chest CT on 08/23/2013 revealed a  questionable lesion at the pleura.  Chest CT on 08/23/2014 suggested benign etiology with similar scattered pulmonary nodules, unchanged ground glass opacity posterior right upper lobe, subtle subpleural reticulation, and similar low density enlargement of both adrenals.  She recently underwent overnight oximetry (results pending).  She has been on oral iron x 2 years.  She has recurrent iron deficiency anemia.  She denies any bleeding.  Symptomatically, she is fatigued.  She has occipital neuralgia.  She has popcorn pica.  Exam is stable.  She has recurrent microcytic anemia.  Ferritin is 7 (low) and TIBC high consistent with iron deficiency.  Plan: 1. Labs today:  CBC with diff, ferritin, iron studies, folate, B12. 2. Discuss IV iron.  Side effects reviewed.  Information provided. 3. Preauth Venofer. 4. Begin weekly Venofer 200 mg IV x 4. 5. Patient to follow-up with her gastroenterologist for re-evaluation. 6. RTC in 1 month for MD assessment and labs (CBC with diff, ferritin).   Lequita Asal, MD  10/22/2015 , 5:20 PM

## 2015-10-23 LAB — VITAMIN B12: Vitamin B-12: 190 pg/mL (ref 180–914)

## 2015-10-24 ENCOUNTER — Other Ambulatory Visit: Payer: Self-pay | Admitting: Hematology and Oncology

## 2015-10-24 DIAGNOSIS — D509 Iron deficiency anemia, unspecified: Secondary | ICD-10-CM

## 2015-10-29 ENCOUNTER — Inpatient Hospital Stay: Payer: PPO

## 2015-10-29 VITALS — BP 150/74 | HR 78 | Temp 98.6°F | Resp 18

## 2015-10-29 DIAGNOSIS — D649 Anemia, unspecified: Secondary | ICD-10-CM

## 2015-10-29 DIAGNOSIS — D509 Iron deficiency anemia, unspecified: Secondary | ICD-10-CM

## 2015-10-29 MED ORDER — SODIUM CHLORIDE 0.9 % IV SOLN
Freq: Once | INTRAVENOUS | Status: AC
  Administered 2015-10-29: 09:00:00 via INTRAVENOUS
  Filled 2015-10-29: qty 1000

## 2015-10-29 MED ORDER — SODIUM CHLORIDE 0.9 % IV SOLN
200.0000 mg | Freq: Once | INTRAVENOUS | Status: AC
  Administered 2015-10-29: 200 mg via INTRAVENOUS
  Filled 2015-10-29: qty 10

## 2015-11-04 LAB — METHYLMALONIC ACID, SERUM: Methylmalonic Acid, Quantitative: 206 nmol/L (ref 0–378)

## 2015-11-05 ENCOUNTER — Other Ambulatory Visit: Payer: Self-pay | Admitting: Hematology and Oncology

## 2015-11-05 ENCOUNTER — Inpatient Hospital Stay: Payer: PPO

## 2015-11-05 VITALS — BP 115/74 | HR 68 | Temp 97.0°F | Resp 18

## 2015-11-05 DIAGNOSIS — D509 Iron deficiency anemia, unspecified: Secondary | ICD-10-CM | POA: Diagnosis not present

## 2015-11-05 DIAGNOSIS — D649 Anemia, unspecified: Secondary | ICD-10-CM

## 2015-11-05 MED ORDER — SODIUM CHLORIDE 0.9 % IV SOLN
Freq: Once | INTRAVENOUS | Status: AC
Start: 2015-11-05 — End: 2015-11-05
  Administered 2015-11-05: 09:00:00 via INTRAVENOUS
  Filled 2015-11-05: qty 1000

## 2015-11-05 MED ORDER — IRON SUCROSE 20 MG/ML IV SOLN
200.0000 mg | Freq: Once | INTRAVENOUS | Status: AC
  Administered 2015-11-05: 200 mg via INTRAVENOUS
  Filled 2015-11-05: qty 10

## 2015-11-11 ENCOUNTER — Ambulatory Visit: Payer: PPO | Admitting: Family Medicine

## 2015-11-12 ENCOUNTER — Inpatient Hospital Stay: Payer: PPO | Attending: Hematology and Oncology

## 2015-11-12 VITALS — BP 135/74 | HR 66 | Temp 97.2°F | Resp 18

## 2015-11-12 DIAGNOSIS — R531 Weakness: Secondary | ICD-10-CM | POA: Insufficient documentation

## 2015-11-12 DIAGNOSIS — Z87891 Personal history of nicotine dependence: Secondary | ICD-10-CM | POA: Insufficient documentation

## 2015-11-12 DIAGNOSIS — Z7982 Long term (current) use of aspirin: Secondary | ICD-10-CM | POA: Insufficient documentation

## 2015-11-12 DIAGNOSIS — E039 Hypothyroidism, unspecified: Secondary | ICD-10-CM | POA: Insufficient documentation

## 2015-11-12 DIAGNOSIS — K219 Gastro-esophageal reflux disease without esophagitis: Secondary | ICD-10-CM | POA: Diagnosis not present

## 2015-11-12 DIAGNOSIS — Z79899 Other long term (current) drug therapy: Secondary | ICD-10-CM | POA: Insufficient documentation

## 2015-11-12 DIAGNOSIS — R0602 Shortness of breath: Secondary | ICD-10-CM | POA: Diagnosis not present

## 2015-11-12 DIAGNOSIS — R51 Headache: Secondary | ICD-10-CM | POA: Insufficient documentation

## 2015-11-12 DIAGNOSIS — F5089 Other specified eating disorder: Secondary | ICD-10-CM | POA: Diagnosis not present

## 2015-11-12 DIAGNOSIS — D509 Iron deficiency anemia, unspecified: Secondary | ICD-10-CM | POA: Diagnosis not present

## 2015-11-12 DIAGNOSIS — Z801 Family history of malignant neoplasm of trachea, bronchus and lung: Secondary | ICD-10-CM | POA: Diagnosis not present

## 2015-11-12 DIAGNOSIS — J449 Chronic obstructive pulmonary disease, unspecified: Secondary | ICD-10-CM | POA: Insufficient documentation

## 2015-11-12 DIAGNOSIS — D649 Anemia, unspecified: Secondary | ICD-10-CM

## 2015-11-12 MED ORDER — SODIUM CHLORIDE 0.9 % IV SOLN
Freq: Once | INTRAVENOUS | Status: AC
  Administered 2015-11-12: 14:00:00 via INTRAVENOUS
  Filled 2015-11-12: qty 1000

## 2015-11-12 MED ORDER — SODIUM CHLORIDE 0.9 % IV SOLN
200.0000 mg | Freq: Once | INTRAVENOUS | Status: AC
  Administered 2015-11-12: 200 mg via INTRAVENOUS
  Filled 2015-11-12: qty 10

## 2015-11-19 ENCOUNTER — Inpatient Hospital Stay (HOSPITAL_BASED_OUTPATIENT_CLINIC_OR_DEPARTMENT_OTHER): Payer: PPO | Admitting: Hematology and Oncology

## 2015-11-19 ENCOUNTER — Inpatient Hospital Stay: Payer: PPO

## 2015-11-19 VITALS — BP 131/72 | HR 105 | Temp 95.6°F | Resp 19 | Ht 65.0 in | Wt 199.7 lb

## 2015-11-19 DIAGNOSIS — K219 Gastro-esophageal reflux disease without esophagitis: Secondary | ICD-10-CM

## 2015-11-19 DIAGNOSIS — Z79899 Other long term (current) drug therapy: Secondary | ICD-10-CM

## 2015-11-19 DIAGNOSIS — R0602 Shortness of breath: Secondary | ICD-10-CM | POA: Diagnosis not present

## 2015-11-19 DIAGNOSIS — D509 Iron deficiency anemia, unspecified: Secondary | ICD-10-CM | POA: Diagnosis not present

## 2015-11-19 DIAGNOSIS — F5089 Other specified eating disorder: Secondary | ICD-10-CM

## 2015-11-19 DIAGNOSIS — E039 Hypothyroidism, unspecified: Secondary | ICD-10-CM

## 2015-11-19 DIAGNOSIS — Z87891 Personal history of nicotine dependence: Secondary | ICD-10-CM

## 2015-11-19 DIAGNOSIS — R531 Weakness: Secondary | ICD-10-CM

## 2015-11-19 DIAGNOSIS — Z7982 Long term (current) use of aspirin: Secondary | ICD-10-CM

## 2015-11-19 DIAGNOSIS — R51 Headache: Secondary | ICD-10-CM

## 2015-11-19 DIAGNOSIS — J449 Chronic obstructive pulmonary disease, unspecified: Secondary | ICD-10-CM

## 2015-11-19 LAB — CBC WITH DIFFERENTIAL/PLATELET
Basophils Absolute: 0.1 10*3/uL (ref 0–0.1)
Basophils Relative: 2 %
Eosinophils Absolute: 0.2 10*3/uL (ref 0–0.7)
Eosinophils Relative: 3 %
HCT: 35.9 % (ref 35.0–47.0)
Hemoglobin: 11.6 g/dL — ABNORMAL LOW (ref 12.0–16.0)
Lymphocytes Relative: 31 %
Lymphs Abs: 1.9 10*3/uL (ref 1.0–3.6)
MCH: 25.9 pg — ABNORMAL LOW (ref 26.0–34.0)
MCHC: 32.4 g/dL (ref 32.0–36.0)
MCV: 79.8 fL — ABNORMAL LOW (ref 80.0–100.0)
Monocytes Absolute: 0.4 10*3/uL (ref 0.2–0.9)
Monocytes Relative: 7 %
Neutro Abs: 3.4 10*3/uL (ref 1.4–6.5)
Neutrophils Relative %: 57 %
Platelets: 291 10*3/uL (ref 150–440)
RBC: 4.49 MIL/uL (ref 3.80–5.20)
RDW: 23.2 % — ABNORMAL HIGH (ref 11.5–14.5)
WBC: 6 10*3/uL (ref 3.6–11.0)

## 2015-11-19 LAB — FERRITIN: Ferritin: 73 ng/mL (ref 11–307)

## 2015-11-19 NOTE — Progress Notes (Signed)
[facility] day:  11/19/2015   Chief Complaint: Leslie Olsen is an 65 y.o. female with iron deficiency anemia who seen for 1 month assessment after IV iron.  HPI: The patient was last seen in the medical oncology clinic on 10/22/2015.  At that time,  she was fatigued.  She had occipital neuralgia.  She was considering a nerve block.  She had popcorn pica.  Exam was stable.  She had recurrent microcytic anemia.  Hematocrit was 30.6, hemoglobin 9.6, and MCV 75.5.  Ferritin was 7 (low) and TIBC 462 (high) consistent with iron deficiency.  She received Venofer 200 mg on 10/29/2015, 11/05/2015, and 11/12/2015.  She states that the iron has not helped her fatigue.  Popcorn and ice pica has resolved.  She states that she had a colonoscopy and EGD 1 year ago.  Regarding her diet, she eats "a ton of red meat'.  She notes chronic shortness of breath "like I'm losing my air".  She notes shortness of breath with vacuuming.  Symptoms have been going on "a long time".  She notes a history of COPD secondary to smoking for 45 years.  She previously saw Dr. Simonne Maffucci, pulmonologist.  She has not seen him in over a year (he moved to [facility]).  She last underwent chest CT on 08/13/2014.  Imaging revealed mild centrilobar emphysema, right minor fissure thickening, 5 mm RLL nodule, and 5 mm LLL subpleural nodule.  She notes a family history of malignancy (brother with stage IV lung cancer; aunt with breast cancer; uncle with lung cancer).  Past Medical History  Diagnosis Date  . COPD (chronic obstructive pulmonary disease) ([place])   . GERD (gastroesophageal reflux disease)   . Hypothyroidism   . Anemia   . Chronic headaches     Past Surgical History  Procedure Laterality Date  . Abdominal hysterectomy    . Back surgery    . Shoulder surgery    . Coronary stent placement    . Tibia fracture surgery    . Replacement total knee    . Cholecystectomy     . Reduction mammaplasty      Family History  Problem Relation Age of Onset  . Lung cancer Maternal Aunt   . Breast cancer Maternal Aunt 57  . Other Mother     Killed in car accident, 30  . Other Father     Killed in car accident, 29  . Thyroid disease Brother   . Thyroid disease Sister     Social History:  reports that she quit smoking about 4 years ago. Her smoking use included Cigarettes. She has a 94 pack-year smoking history. She has never used smokeless tobacco. She reports that she does not drink alcohol or use illicit drugs.  She is alone today.  Allergies: No Known Allergies  Current Medications: Current Outpatient Prescriptions  Medication Sig Dispense Refill  . albuterol (PROVENTIL HFA;VENTOLIN HFA) 108 (90 BASE) MCG/ACT inhaler Inhale 2 puffs into the lungs every 6 (six) hours as needed for wheezing.    Marland Kitchen aspirin 81 MG tablet Take 81 mg by mouth daily.    Marland Kitchen esomeprazole (NEXIUM) 20 MG capsule Take 20 mg by mouth daily before breakfast. Reported on 11/19/2015    . ferrous fumarate (HEMOCYTE - 106 MG FE) 325 (106 FE) MG TABS tablet Take 1 tablet by mouth daily.    Marland Kitchen gabapentin (NEURONTIN) 300 MG capsule Take 1 capsule (300 mg total) by mouth  3 (three) times daily. 90 capsule 3  . glucose blood (COOL BLOOD GLUCOSE TEST STRIPS) test strip Use as instructed 100 each 12  . levothyroxine (SYNTHROID, LEVOTHROID) 75 MCG tablet Take 1 tablet (75 mcg total) by mouth daily. Brand name required, come back in 6 weeks for lab recheck 30 tablet 1  . lisinopril (PRINIVIL,ZESTRIL) 5 MG tablet Take 1 tablet (5 mg total) by mouth daily. 90 tablet 1  . metFORMIN (GLUCOPHAGE) 500 MG tablet Take 1 tablet (500 mg total) by mouth 2 (two) times daily with a meal. 360 tablet 1  . Omega-3 Fatty Acids (FISH OIL) 1200 MG CAPS Take 1 capsule by mouth daily.    . Red Yeast Rice 600 MG CAPS Take 2 capsules by mouth daily.    . SUMAtriptan (IMITREX) 100 MG tablet Take 100 mg by mouth every 2 (two) hours  as needed for migraine. May repeat in 2 hours if headache persists or recurs.    . SYMBICORT 160-4.5 MCG/ACT inhaler INHALE TWO PUFFS INTO THE LUNGS TWICE DAILY 1 Inhaler 5   No current facility-administered medications for this visit.    Review of Systems:  GENERAL:  Fatigue.  No fevers or sweats.  Weight loss of 4 pounds. PERFORMANCE STATUS (ECOG):  2 HEENT:  No visual changes, runny nose, sore throat, mouth sores or tenderness. Lungs:  Chronic shortness of breath ("long time").  Rare cough.  No pleuritic chest pain.  No hemoptysis.  CPAP causes head pain. Cardiac:  No chest pain, palpitations, or PND.  Two pillow orthopnea. GI:  No nausea, vomiting, diarrhea, constipation, melena or hematochezia.  Popcorn pica.   GU:  No urgency, frequency, dysuria, or hematuria. Musculoskeletal:  No back pain.  No joint pain.  No muscle tenderness. Extremities:  No pain or swelling. Skin:  No rashes or skin changes. Neuro:  Occipital headaches.  Feels like biting on an electrical cord or battery.  Considering nerve block.  No numbness or weakness, balance or coordination issues. Endocrine:  Hypothyroid on Synthroid.  Diabetes.  No hot flashes or night sweats. Psych:  No mood changes, depression or anxiety. Pain:  No focal pain. Review of systems:  All other systems reviewed and found to be negative.   Physical Exam: Blood pressure 131/72, pulse 105, temperature 95.6 F (35.3 C), resp. rate 19, height 5\' 5"  (1.651 m), weight 199 lb 11.8 oz (90.6 kg), SpO2 97 %.  GENERAL:  Well developed, well nourished, fatigued appearing woman sitting comfortably in the exam room in no acute distress. MENTAL STATUS:  Alert and oriented to person, place and time. HEAD:  Short gray hair.  Normocephalic, atraumatic, face symmetric, no Cushingoid features. EYES:  Blue eyes.  Pupils equal round and reactive to light and accomodation.  No conjunctivitis or scleral icterus. ENT:  Oropharynx clear without lesion.  Partial.   Tongue normal.  Mucous membranes moist.  RESPIRATORY:  Clear to auscultation without rales, wheezes or rhonchi.  Transient rub RUL (not reproducible). CARDIOVASCULAR:  Regular rate and rhythm without murmur, rub or gallop. ABDOMEN:  Soft, non-tender, with active bowel sounds, and no appreciable hepatosplenomegaly.  No masses. SKIN:  No rashes, ulcers or lesions. EXTREMITIES: No edema, no skin discoloration or tenderness.  No palpable cords. LYMPH NODES: No palpable cervical, supraclavicular, axillary or inguinal adenopathy  NEUROLOGICAL: Unremarkable. PSYCH:  Appropriate.   Appointment on 11/19/2015  Component Date Value Ref Range Status  . WBC 11/19/2015 6.0  3.6 - 11.0 K/uL Final  . RBC 11/19/2015  4.49  3.80 - 5.20 MIL/uL Final  . Hemoglobin 11/19/2015 11.6* 12.0 - 16.0 g/dL Final  . HCT 11/19/2015 35.9  35.0 - 47.0 % Final  . MCV 11/19/2015 79.8* 80.0 - 100.0 fL Final  . MCH 11/19/2015 25.9* 26.0 - 34.0 pg Final  . MCHC 11/19/2015 32.4  32.0 - 36.0 g/dL Final  . RDW 11/19/2015 23.2* 11.5 - 14.5 % Final  . Platelets 11/19/2015 291  150 - 440 K/uL Final  . Neutrophils Relative % 11/19/2015 57   Final  . Neutro Abs 11/19/2015 3.4  1.4 - 6.5 K/uL Final  . Lymphocytes Relative 11/19/2015 31   Final  . Lymphs Abs 11/19/2015 1.9  1.0 - 3.6 K/uL Final  . Monocytes Relative 11/19/2015 7   Final  . Monocytes Absolute 11/19/2015 0.4  0.2 - 0.9 K/uL Final  . Eosinophils Relative 11/19/2015 3   Final  . Eosinophils Absolute 11/19/2015 0.2  0 - 0.7 K/uL Final  . Basophils Relative 11/19/2015 2   Final  . Basophils Absolute 11/19/2015 0.1  0 - 0.1 K/uL Final  . Ferritin 11/19/2015 73  11 - 307 ng/mL Final    Assessment:  Leslie Olsen is an 65 y.o. female with a history of iron deficiency anemia.  Colonoscopy and upper endoscopy were negative on 06/05/2014.  Her diet is good.  She denies any melena or hematochezia.  She denies any uterine bleeding.    She has a 90 pack year smoking  history.  She has COPD.  Chest CT on 08/23/2013 revealed a questionable lesion at the pleura.  Chest CT on 08/23/2014 suggested benign etiology with similar scattered pulmonary nodules, unchanged ground glass opacity posterior right upper lobe, subtle subpleural reticulation, and similar low density enlargement of both adrenals.    She has a CPAP at home.  It makes her head hurt.  She underwent overnight oximetry (results unknown).  She was on oral iron x 2 years.  She developed recurrent iron deficiency anemia.  She denies any melena or hematochezia.  She received Venofer 600 mg (10/29/2015 - 11/12/2015).  Popcorn and ice pica have resolved.  Hematocrit has improved from 30.6 to 35.9.  Ferritin has improved from 7 to 73.  Symptomatically, she notes persistent fatigue despite improvement in her hematocrit.  She has chronic shortness of breath.  She has not seen a pulmonologist in > 1 year.  Exam is stable.   Plan: 1.  Labs today:  CBC with diff, ferritin. 2.  Discuss normalization in hematocrit and adequate iron level.  No need for further Venofer now. 3.  Check oxygen saturations with ambulation (97%). 4.  Discuss concern for respiratory symptoms and need for evaluation in the emergency room.  Patient declined.  Discuss need for re-evaluation by pulmonary medicine and likely follow-up chest CT.  Patient does not want to follow-up with Dr. Lake Bells in [place].  Will set up appointment with [facility] ASAP. 5.  Consult pulmonary medicine. 6.  Obtain copy of last endoscopies.  Patient may need follow-up with gastroenterology. 7.  RTC in 3 months for MD assessment and labs (CBC with diff, ferritin).   Lequita Asal, MD  11/19/2015 , 3:05 PM

## 2015-11-19 NOTE — Progress Notes (Signed)
Pt reports being SOB sitting, talking etc.  Pt reports that venofer infusions are not really helping with SOB, Bone pain the only thing that lessened was the cravings for popcorn and ice.  Reports always having a headache and that has also not improved.

## 2015-11-20 ENCOUNTER — Encounter: Payer: Self-pay | Admitting: Hematology and Oncology

## 2015-11-25 ENCOUNTER — Ambulatory Visit: Payer: PPO | Admitting: Family Medicine

## 2015-11-27 ENCOUNTER — Ambulatory Visit
Admission: RE | Admit: 2015-11-27 | Discharge: 2015-11-27 | Disposition: A | Discharge status: Home / Self Care | Payer: PPO | Source: Ambulatory Visit | Attending: Internal Medicine | Admitting: Internal Medicine

## 2015-11-27 ENCOUNTER — Ambulatory Visit (INDEPENDENT_AMBULATORY_CARE_PROVIDER_SITE_OTHER): Payer: PPO | Admitting: Internal Medicine

## 2015-11-27 ENCOUNTER — Other Ambulatory Visit: Payer: Self-pay

## 2015-11-27 ENCOUNTER — Encounter: Payer: Self-pay | Admitting: Internal Medicine

## 2015-11-27 ENCOUNTER — Ambulatory Visit (INDEPENDENT_AMBULATORY_CARE_PROVIDER_SITE_OTHER): Payer: PPO

## 2015-11-27 ENCOUNTER — Institutional Professional Consult (permissible substitution): Payer: PPO | Admitting: Internal Medicine

## 2015-11-27 VITALS — BP 132/72 | HR 110 | Ht 66.0 in | Wt 198.2 lb

## 2015-11-27 DIAGNOSIS — G4733 Obstructive sleep apnea (adult) (pediatric): Secondary | ICD-10-CM

## 2015-11-27 DIAGNOSIS — R0600 Dyspnea, unspecified: Secondary | ICD-10-CM | POA: Insufficient documentation

## 2015-11-27 DIAGNOSIS — R0602 Shortness of breath: Secondary | ICD-10-CM | POA: Diagnosis not present

## 2015-11-27 DIAGNOSIS — D509 Iron deficiency anemia, unspecified: Secondary | ICD-10-CM

## 2015-11-27 DIAGNOSIS — J438 Other emphysema: Secondary | ICD-10-CM

## 2015-11-27 DIAGNOSIS — Z9989 Dependence on other enabling machines and devices: Secondary | ICD-10-CM

## 2015-11-27 LAB — ECHOCARDIOGRAM COMPLETE
Height: 66 in
WEIGHTICAEL: 3171.2 [oz_av]

## 2015-11-27 MED ORDER — BUDESONIDE-FORMOTEROL FUMARATE 160-4.5 MCG/ACT IN AERO: 1.0000 | INHALATION_SPRAY | Freq: Two times a day (BID) | RESPIRATORY_TRACT | Status: DC

## 2015-11-27 MED ORDER — PREDNISONE 20 MG PO TABS: 20.0000 mg | ORAL_TABLET | Freq: Every day | ORAL | Status: DC

## 2015-11-27 MED ORDER — ALBUTEROL SULFATE HFA 108 (90 BASE) MCG/ACT IN AERS: 2.0000 | INHALATION_SPRAY | Freq: Four times a day (QID) | RESPIRATORY_TRACT | Status: DC | PRN

## 2015-11-27 NOTE — Assessment & Plan Note (Signed)
Fe def anemia, now corrected by Heme\Onc.  Ferritin=76, Hb=11.6  Plan: - cont supportive care and follow with Heme\Onc.

## 2015-11-27 NOTE — Progress Notes (Signed)
Subjective:    Patient ID: Leslie Olsen, female    DOB: [birth year], 65 y.o.   MRN: [id number]  Synopsis: This is a 65 year old female with GOLD grade C COPD who first saw the [facility] in early 2014 for evaluation of the same. She was hospitalized for an exacerbation of COPD in March 2013, October 2013. She also has obstructive sleep apnea and uses CPAP. She had been intolerant of Roflumilast as it caused diarrhea. She previously smoked 2 packs of cigarettes daily for 30 years and quit in March of 2013. 11/01/2012 simple spirometry ratio 73%, FEV1 1.94% (76% predicted) flow volume loop consistent with obstruction 02/07/2013 Simple spirometry > ratio 71%, FEV1 2.1 L (83% predicted) 11/27/2015 Simple spirometry > ratio 74%, FEV1 2.0 L (81% predicted) flow volume loop consistent with obstruction  HPI Chief Complaint  Patient presents with  . pulmonary consult    pt ref by dr. Mike Olsen for SOB. former BQ pt last seen 10/2014. pt c/o increased SOB, occ dry cough, wheezing mainly w/ laying down, tightness in rt rib. X29mo.   She presents today for follow-up visit of shortness of breath with rest and adequate with exertion. She's had shortness of breath intermittently over the past couple of the as mostly attributes to iron deficiency anemia. This episode of shortness of breath started in early February, at which point her PMD checked her hemoglobin levels and it was down to about 9. She was referred back to her hematologist, who started her back on iron infusions, her current ferritin is now 76 and her hemoglobin level is up to 11.6. However, after correcting her anemia patient still persisting which shortness of breath at rest and with exertion. Usually, per review of chart and per the patient once her hemoglobin is back to normal her dyspnea usually resolves. She is currently a nonsmoker, is on Symbicort, and as needed albuterol. She has tried albuterol with little relief for  her shortness of breath and chest discomfort which she describes more as chest tightness. She has a history of coronary artery disease and a prior stent placement per the patient. She also has mild COPD per Dr. Anastasia Olsen last note. She also has a history of obstructive sleep apnea on CPAP,last used CPAP about 2 weeks ago, but had to stop because it was triggering headaches (for which she is following neurology).  Past Medical History  Diagnosis Date  . COPD (chronic obstructive pulmonary disease) ([place])   . GERD (gastroesophageal reflux disease)   . Hypothyroidism   . Anemia   . Chronic headaches      Review of Systems  Constitutional: Negative for fever, chills and fatigue.  HENT: Negative for congestion, nosebleeds, postnasal drip, rhinorrhea, sinus pressure and voice change.   Respiratory: Positive for cough, shortness of breath and wheezing. Negative for choking and chest tightness.   Cardiovascular: Negative for chest pain, palpitations and leg swelling.  Endocrine: Negative for cold intolerance and heat intolerance.  Genitourinary: Negative for difficulty urinating.  Neurological: Positive for light-headedness and headaches.  Hematological: Does not bruise/bleed easily.       Objective:   Physical Exam  Constitutional: She is oriented to person, place, and time. She appears well-developed and well-nourished.  HENT:  Head: Normocephalic and atraumatic.  Right Ear: External ear normal.  Left Ear: External ear normal.  Nose: Nose normal.  Mouth/Throat: Oropharynx is clear and moist.  Eyes: Conjunctivae and EOM are normal. Pupils are equal, round, and reactive to light.  Neck: Neck supple.  Cardiovascular: Normal rate, regular rhythm, normal heart sounds and intact distal pulses.   Pulmonary/Chest: Effort normal and breath sounds normal. No respiratory distress. She has no wheezes. She has no rales.  Abdominal: Soft. Bowel sounds are normal. She exhibits no distension.   Musculoskeletal: Normal range of motion. She exhibits no edema.  Neurological: She is alert and oriented to person, place, and time.  Skin: Skin is warm and dry.  Nursing note and vitals reviewed.   Filed Vitals:   11/27/15 1330  BP: 132/72  Pulse: 110  Height: 5\' 6"  (1.676 m)  Weight: 198 lb 3.2 oz (89.903 kg)  SpO2: 93%   RA  Gen: well appearing, no acute distress HEENT: NCAT, EOMi, OP clear,  PULM: CTA B CV: RRR, slight systolic murmur, no JVD AB: BS+, soft, nontender, no hsm Ext: warm, no edema, no clubbing, no cyanosis Neuro: speech is clear and fluent today  Imaging: (The following images and results were reviewed by Leslie Olsen on 11/27/2015). CXR 12/2014 CLINICAL DATA: Syncope, was not feeling well, vomited then passed out, diaphoresis, cough for few days, 93% oxygen saturation on room air  EXAM: CHEST 2 VIEW  COMPARISON: None ; due to down time, prior CT chest exams are not available for comparison.  FINDINGS: Normal heart size, mediastinal contours and pulmonary vascularity for technique.  Peribronchial thickening centrally.  No acute infiltrate, pleural effusion or pneumothorax.  Bones demineralized.  IMPRESSION: Bronchitic changes without acute infiltrate.     Assessment & Plan:   COPD (chronic obstructive pulmonary disease) ([place]) This has been a stable interval for Leslie Olsen. She only has mild COPD and today on exam her lungs sound normal. Her oximetry is normal. I explained to her today that I do not think that her lung disease is the cause of her generalized fatigue dyspnea. Otherwise she should continue taking her Symbicort for her mild COPD. She had an ONO done last year which was normal.  Spirometry today showed > ratio 74%, FEV1 2.0 L (81% predicted) - unchanged from 2014, very mild obstruction.  Plan: - Highly unlikely that COPD is causing her chest tightness/dyspnea, given her level of dyspnea, will treat as a mild copd exacerbation,  and also refer to cardiology for SOB/CAD - Prednisone 20 mg-one tab daily 5 days - cont with symbicort - cont with rescue inhaler.     Anemia Fe def anemia, now corrected by Heme\Onc.  Ferritin=76, Hb=11.6  Plan: - cont supportive care and follow with Heme\Onc.    Dyspnea Multifactorial: COPD, OSA, coronary artery disease status post stent placement, iron deficiency anemia  At today's visit she had a completely normal pulmonary exam, spirometry was also normal and unchanged from 2 years ago. There was very mild obstruction on spirometry that does not correlate with the level of dyspnea and chest tightness she's having. At this time I highly doubt that this episode of dyspnea/chest tightness is due to COPD or any other pulmonary issues. Her last echocardiogram was 2 years ago per records, that showed mild LA dilation, normal ejection fraction, and grade 1 diastolic dysfunction. Given that her anemia is now corrected and she is optimize with COPD treatment without improvement in shortness of breath and chest tightness, I believe that further cardiac evaluation is warranted at this time, especially with prior history of coronary artery disease and stent placement.  Plan: -Referral to cardiology -Echocardiogram and EKG prior to her allergy follow-up -2 view chest x-ray for shortness of breath.  OSA on CPAP Recently stop using CPAP due to headaches. She is following urology for headaches. She previously had very good compliance with her CPAP and her fatigue was less when using CPAP also. Today I discussed with her the importance of restarting a CPAP, for which she stated she will try pending it does not induce any more migraine headaches.     Updated Medication List Outpatient Encounter Prescriptions as of 11/27/2015  Medication Sig  . albuterol (PROVENTIL HFA;VENTOLIN HFA) 108 (90 Base) MCG/ACT inhaler Inhale 2 puffs into the lungs every 6 (six) hours as needed for wheezing.  Marland Kitchen  aspirin 81 MG tablet Take 81 mg by mouth daily.  . budesonide-formoterol (SYMBICORT) 160-4.5 MCG/ACT inhaler Inhale 1 puff into the lungs 2 (two) times daily.  Marland Kitchen esomeprazole (NEXIUM) 20 MG capsule Take 20 mg by mouth daily before breakfast. Reported on 11/19/2015  . ferrous fumarate (HEMOCYTE - 106 MG FE) 325 (106 FE) MG TABS tablet Take 1 tablet by mouth daily.  Marland Kitchen gabapentin (NEURONTIN) 300 MG capsule Take 1 capsule (300 mg total) by mouth 3 (three) times daily.  Marland Kitchen glucose blood (COOL BLOOD GLUCOSE TEST STRIPS) test strip Use as instructed  . levothyroxine (SYNTHROID, LEVOTHROID) 75 MCG tablet Take 1 tablet (75 mcg total) by mouth daily. Brand name required, come back in 6 weeks for lab recheck  . lisinopril (PRINIVIL,ZESTRIL) 5 MG tablet Take 1 tablet (5 mg total) by mouth daily.  . metFORMIN (GLUCOPHAGE) 500 MG tablet Take 1 tablet (500 mg total) by mouth 2 (two) times daily with a meal.  . Omega-3 Fatty Acids (FISH OIL) 1200 MG CAPS Take 1 capsule by mouth daily.  . Red Yeast Rice 600 MG CAPS Take 2 capsules by mouth daily.  . SUMAtriptan (IMITREX) 100 MG tablet Take 100 mg by mouth every 2 (two) hours as needed for migraine. May repeat in 2 hours if headache persists or recurs.  . [DISCONTINUED] albuterol (PROVENTIL HFA;VENTOLIN HFA) 108 (90 BASE) MCG/ACT inhaler Inhale 2 puffs into the lungs every 6 (six) hours as needed.  . [DISCONTINUED] SYMBICORT 160-4.5 MCG/ACT inhaler INHALE TWO PUFFS INTO THE LUNGS TWICE DAILY  . predniSONE (DELTASONE) 20 MG tablet Take 1 tablet (20 mg total) by mouth daily.   No facility-administered encounter medications on file as of 11/27/2015.

## 2015-11-27 NOTE — Patient Instructions (Addendum)
Follow up with Dr. Stevenson Clinch in: 1 months -2 view CXR for SOB -follow up with Cardiology [facility]) for SOB and chest tightness -ECHO Cardiogram prior to cardiology follow up  -EKG prior to cardiology follow up  -Prednisone 20mg  x 5 days - 1 tab daily -cont with Symbicort and as needed albuterol -please start back wearing your cpap as tolerated.

## 2015-11-27 NOTE — Assessment & Plan Note (Signed)
Multifactorial: COPD, OSA, coronary artery disease status post stent placement, iron deficiency anemia  At today's visit she had a completely normal pulmonary exam, spirometry was also normal and unchanged from 2 years ago. There was very mild obstruction on spirometry that does not correlate with the level of dyspnea and chest tightness she's having. At this time I highly doubt that this episode of dyspnea/chest tightness is due to COPD or any other pulmonary issues. Her last echocardiogram was 2 years ago per records, that showed mild LA dilation, normal ejection fraction, and grade 1 diastolic dysfunction. Given that her anemia is now corrected and she is optimize with COPD treatment without improvement in shortness of breath and chest tightness, I believe that further cardiac evaluation is warranted at this time, especially with prior history of coronary artery disease and stent placement.  Plan: -Referral to cardiology -Echocardiogram and EKG prior to her allergy follow-up -2 view chest x-ray for shortness of breath.

## 2015-11-27 NOTE — Assessment & Plan Note (Addendum)
This has been a stable interval for Leslie Olsen. She only has mild COPD and today on exam her lungs sound normal. Her oximetry is normal. I explained to her today that I do not think that her lung disease is the cause of her generalized fatigue dyspnea. Otherwise she should continue taking her Symbicort for her mild COPD. She had an ONO done last year which was normal.  Spirometry today showed > ratio 74%, FEV1 2.0 L (81% predicted) - unchanged from 2014, very mild obstruction.  Plan: - Highly unlikely that COPD is causing her chest tightness/dyspnea, given her level of dyspnea, will treat as a mild copd exacerbation, and also refer to cardiology for SOB/CAD - Prednisone 20 mg-one tab daily 5 days - cont with symbicort - cont with rescue inhaler.

## 2015-11-27 NOTE — Assessment & Plan Note (Signed)
Recently stop using CPAP due to headaches. She is following urology for headaches. She previously had very good compliance with her CPAP and her fatigue was less when using CPAP also. Today I discussed with her the importance of restarting a CPAP, for which she stated she will try pending it does not induce any more migraine headaches.

## 2015-11-28 ENCOUNTER — Telehealth: Payer: Self-pay | Admitting: *Deleted

## 2015-11-28 NOTE — Telephone Encounter (Signed)
-----   Message from Vilinda Boehringer, MD sent at 11/27/2015  5:22 PM EDT ----- Regarding: CXR resuls Please inform patient that I have reviewed her CXR.  There are no signs of pneumonia or bronchitis.   Thank you

## 2015-11-28 NOTE — Telephone Encounter (Signed)
Pt informed of CXR results. Nothing further needed. 

## 2015-12-12 ENCOUNTER — Encounter: Payer: Self-pay | Admitting: Cardiology

## 2015-12-12 ENCOUNTER — Ambulatory Visit (INDEPENDENT_AMBULATORY_CARE_PROVIDER_SITE_OTHER): Payer: PPO | Admitting: Cardiology

## 2015-12-12 VITALS — BP 128/64 | HR 66 | Ht 66.0 in | Wt 199.0 lb

## 2015-12-12 DIAGNOSIS — E669 Obesity, unspecified: Secondary | ICD-10-CM

## 2015-12-12 DIAGNOSIS — R0602 Shortness of breath: Secondary | ICD-10-CM | POA: Diagnosis not present

## 2015-12-12 DIAGNOSIS — E785 Hyperlipidemia, unspecified: Secondary | ICD-10-CM | POA: Diagnosis not present

## 2015-12-12 DIAGNOSIS — I10 Essential (primary) hypertension: Secondary | ICD-10-CM | POA: Diagnosis not present

## 2015-12-12 DIAGNOSIS — R9431 Abnormal electrocardiogram [ECG] [EKG]: Secondary | ICD-10-CM | POA: Diagnosis not present

## 2015-12-12 NOTE — Patient Instructions (Addendum)
Medication Instructions:  Your physician recommends that you continue on your current medications as directed. Please refer to the Current Medication list given to you today.   Labwork: None ordered  Testing/Procedures: Your physician has requested that you have en exercise stress myoview. For further information please visit [email or web address]. Please follow instruction sheet, as given.  Leslie Olsen  Your caregiver has ordered a Stress Test with nuclear imaging. The purpose of this test is to evaluate the blood supply to your heart muscle. This procedure is referred to as a "Non-Invasive Stress Test." This is because other than having an IV started in your vein, nothing is inserted or "invades" your body. Cardiac stress tests are done to find areas of poor blood flow to the heart by determining the extent of coronary artery disease (CAD). Some patients exercise on a treadmill, which naturally increases the blood flow to your heart, while others who are  unable to walk on a treadmill due to physical limitations have a pharmacologic/chemical stress agent called Lexiscan . This medicine will mimic walking on a treadmill by temporarily increasing your coronary blood flow.   Please note: these test may take anywhere between 2-4 hours to complete  PLEASE REPORT TO [facility] AT THE FIRST DESK WILL DIRECT YOU WHERE TO GO  Date of Procedure:___Wednesday December 18, 2015 at 07:30AM___________  Arrival Time for Procedure:__Arrive at 07:15 AM____________  Instructions regarding medication:   _X_ : Hold Metformin the night before and 2 days after stress test  _X_:  Hold Lisinopril the morning of procedure     PLEASE NOTIFY THE OFFICE AT LEAST 24 HOURS IN ADVANCE IF YOU ARE UNABLE TO KEEP YOUR APPOINTMENT.  [phone] AND  PLEASE NOTIFY NUCLEAR MEDICINE AT [facility] AT LEAST 24 HOURS IN ADVANCE IF YOU ARE UNABLE TO KEEP YOUR APPOINTMENT. [phone]  How to  prepare for your Myoview test:   Do not eat or drink after midnight  No caffeine for 24 hours prior to test  No smoking 24 hours prior to test.  Your medication may be taken with water.  If your doctor stopped a medication because of this test, do not take that medication.  Ladies, please do not wear dresses.  Skirts or pants are appropriate. Please wear a short sleeve shirt.  No perfume, cologne or lotion.  Wear comfortable walking shoes. No heels!            Follow-Up: Your physician recommends that you schedule a follow-up appointment after testing to review results.  Date & Time: ______________________________________________________________________   Any Other Special Instructions Will Be Listed Below (If Applicable).     If you need a refill on your cardiac medications before your next appointment, please call your pharmacy.  Cardiac Nuclear Scanning A cardiac nuclear scan is used to check your heart for problems, such as the following:  A portion of the heart is not getting enough blood.  Part of the heart muscle has died, which happens with a heart attack.  The heart wall is not working normally.  In this test, a radioactive dye (tracer) is injected into your bloodstream. After the tracer has traveled to your heart, a scanning device is used to measure how much of the tracer is absorbed by or distributed to various areas of your heart. LET [facility] CARE PROVIDER KNOW ABOUT:  Any allergies you have.  All medicines you are taking, including vitamins, herbs, eye drops, creams, and over-the-counter medicines.  Previous problems  you or members of your family have had with the use of anesthetics.  Any blood disorders you have.  Previous surgeries you have had.  Medical conditions you have.  RISKS AND COMPLICATIONS Generally, this is a safe procedure. However, as with any procedure, problems can occur. Possible problems include:   Serious chest  pain.  Rapid heartbeat.  Sensation of warmth in your chest. This usually passes quickly. BEFORE THE PROCEDURE Ask your health care provider about changing or stopping your regular medicines. PROCEDURE This procedure is usually done at a hospital and takes 2-4 hours.  An IV tube is inserted into one of your veins.  Your health care provider will inject a small amount of radioactive tracer through the tube.  You will then wait for 20-40 minutes while the tracer travels through your bloodstream.  You will lie down on an exam table so images of your heart can be taken. Images will be taken for about 15-20 minutes.  You will exercise on a treadmill or stationary bike. While you exercise, your heart activity will be monitored with an electrocardiogram (ECG), and your blood pressure will be checked.  If you are unable to exercise, you may be given a medicine to make your heart beat faster.  When blood flow to your heart has peaked, tracer will again be injected through the IV tube.  After 20-40 minutes, you will get back on the exam table and have more images taken of your heart.  When the procedure is over, your IV tube will be removed. AFTER THE PROCEDURE  You will likely be able to leave shortly after the test. Unless your health care provider tells you otherwise, you may return to your normal schedule, including diet, activities, and medicines.  Make sure you find out how and when you will get your test results.   This information is not intended to replace advice given to you by your health care provider. Make sure you discuss any questions you have with your health care provider.   Document Released: 09/18/2004 Document Revised: 08/29/2013 Document Reviewed: 08/02/2013 Elsevier Interactive Patient Education [employer].

## 2015-12-12 NOTE — Progress Notes (Signed)
Cardiology Office Note   Date:  12/12/2015   ID:  Harmonie Wondra, DOB May 31, 1951, MRN [id number]  Referring Doctor:  Park Liter, DO   Cardiologist:   Wende Bushy, MD   Reason for consultation:  Chief Complaint  Patient presents with  . other    C/o sob. Meds reviewed verbally with pt.      History of Present Illness: Leslie Olsen is a 65 y.o. female who presents for Evaluation of shortness of breath. Pulmonary things that her symptoms are out of proportion to the severity of her COPD.  According to the patient, she's had shortness breath for a long time, symptoms are located mainly in the chest, nonradiating, moderate to severe in intensity, brought on by minimal exertion, relieved by rest. Symptoms have progressed over the last 2-3 months. Patient denies associated chest pain or chest tightness, dizziness, loss of consciousness.  No complaints of headache, fever, cough, colds, orthopnea, PND, edema.  ROS:  Please see the history of present illness. Aside from mentioned under HPI, all other systems are reviewed and negative.     Past Medical History  Diagnosis Date  . COPD (chronic obstructive pulmonary disease) ([place])   . GERD (gastroesophageal reflux disease)   . Hypothyroidism   . Anemia   . Chronic headaches     Past Surgical History  Procedure Laterality Date  . Abdominal hysterectomy    . Back surgery    . Shoulder surgery    . Coronary stent placement    . Tibia fracture surgery    . Replacement total knee    . Cholecystectomy    . Reduction mammaplasty       reports that she quit smoking about 4 years ago. Her smoking use included Cigarettes. She has a 94 pack-year smoking history. She has never used smokeless tobacco. She reports that she does not drink alcohol or use illicit drugs.   family history includes Breast cancer (age of onset: 80) in her maternal aunt; Lung cancer in her maternal aunt; Other in her father and mother; Thyroid  disease in her brother and sister.   Current Outpatient Prescriptions  Medication Sig Dispense Refill  . albuterol (PROVENTIL HFA;VENTOLIN HFA) 108 (90 Base) MCG/ACT inhaler Inhale 2 puffs into the lungs every 6 (six) hours as needed for wheezing. 6.7 g 5  . aspirin 81 MG tablet Take 81 mg by mouth daily.    . budesonide-formoterol (SYMBICORT) 160-4.5 MCG/ACT inhaler Inhale 1 puff into the lungs 2 (two) times daily. 1 Inhaler 5  . ferrous fumarate (HEMOCYTE - 106 MG FE) 325 (106 FE) MG TABS tablet Take 1 tablet by mouth daily.    Marland Kitchen gabapentin (NEURONTIN) 300 MG capsule Take 1 capsule (300 mg total) by mouth 3 (three) times daily. 90 capsule 3  . glucose blood (COOL BLOOD GLUCOSE TEST STRIPS) test strip Use as instructed 100 each 12  . levothyroxine (SYNTHROID, LEVOTHROID) 75 MCG tablet Take 1 tablet (75 mcg total) by mouth daily. Brand name required, come back in 6 weeks for lab recheck 30 tablet 1  . lisinopril (PRINIVIL,ZESTRIL) 5 MG tablet Take 1 tablet (5 mg total) by mouth daily. 90 tablet 1  . metFORMIN (GLUCOPHAGE) 500 MG tablet Take 1 tablet (500 mg total) by mouth 2 (two) times daily with a meal. 360 tablet 1  . Omega-3 Fatty Acids (FISH OIL) 1200 MG CAPS Take 1 capsule by mouth daily.    . Red Yeast Rice 600 MG CAPS  Take 2 capsules by mouth daily.    . SUMAtriptan (IMITREX) 100 MG tablet Take 100 mg by mouth every 2 (two) hours as needed for migraine. May repeat in 2 hours if headache persists or recurs.     No current facility-administered medications for this visit.    Allergies: Review of patient's allergies indicates no known allergies.    PHYSICAL EXAM: VS:  BP 128/64 mmHg  Pulse 66  Ht 5\' 6"  (1.676 m)  Wt 199 lb (90.266 kg)  BMI 32.13 kg/m2 , Body mass index is 32.13 kg/(m^2). Wt Readings from Last 3 Encounters:  12/12/15 199 lb (90.266 kg)  11/27/15 198 lb 3.2 oz (89.903 kg)  11/19/15 199 lb 11.8 oz (90.6 kg)    GENERAL:  well developed, well nourished,  obese, not  in acute distress HEENT: normocephalic, pink conjunctivae, anicteric sclerae, no xanthelasma, normal dentition, oropharynx clear NECK:  no neck vein engorgement, JVP normal, no hepatojugular reflux, carotid upstroke brisk and symmetric, no bruit, no thyromegaly, no lymphadenopathy LUNGS:  good respiratory effort, clear to auscultation bilaterally CV:  PMI not displaced, no thrills, no lifts, S1 and S2 within normal limits, no palpable S3 or S4, no murmurs, no rubs, no gallops ABD:  Soft, nontender, nondistended, normoactive bowel sounds, no abdominal aortic bruit, no hepatomegaly, no splenomegaly MS: nontender back, no kyphosis, no scoliosis, no joint deformities EXT:  2+ DP/PT pulses, no edema, no varicosities, no cyanosis, no clubbing SKIN: warm, nondiaphoretic, normal turgor, no ulcers NEUROPSYCH: alert, oriented to person, place, and time, sensory/motor grossly intact, normal mood, appropriate affect  Recent Labs: 10/11/2015: ALT 12; BUN 14; Creatinine, Ser 0.86; Potassium 4.8; Sodium 141;  11/19/2015: Hemoglobin 11.6*; Platelets 291   Lipid Panel    Component Value Date/Time   CHOL 244* 07/11/2015 1500   CHOL 218* 12/17/2014 0414   TRIG 451* 07/11/2015 1500   TRIG 47 12/17/2014 0414   HDL 40 07/11/2015 1500   HDL 54 12/17/2014 0414   VLDL 9 12/17/2014 0414   LDLCALC Comment 07/11/2015 1500   LDLCALC 155* 12/17/2014 0414     Other studies Reviewed:  EKG:  The ekg from 12/12/2015 was personally reviewed by me and it revealed sinus rhythm, sinus arrhythmia, 66 BPM. Low-voltage QRS.  Additional studies/ records that were reviewed personally reviewed by me today include:   Echo 11/27/2015: Left ventricle: The cavity size was normal. There was mild focal  basal hypertrophy of the septum. Systolic function was normal.  The estimated ejection fraction was in the range of 60% to 65%.  Wall motion was normal; there were no regional wall motion  abnormalities. Doppler parameters  are consistent with abnormal  left ventricular relaxation (grade 1 diastolic dysfunction). - Left atrium: The atrium was normal in size. - Right ventricle: Systolic function was normal. - Pulmonary arteries: Systolic pressure was within the normal  range.   ASSESSMENT AND PLAN:  Shortness of breath Risk factors for coronary artery disease include age, postmenopausal state, significant smoking history, hypertension, diabetes. Agree with further evaluation with exercise nuclear stress test, can be converted to pharmacologic nuclear stress test if patient unable to complete the treadmill protocol. Continue medications for now.  Abnormal EKG Low-voltage QRS noted on EKG from today. However, echocardiogram from 11/27/2015 did not show any wall motion abnormality. No pericardial effusion noted. Patient reassured.  Possible CAD  Patient says that she's had a coronary stent placed. She does not recall the details of what happened. She does not recall having any  symptoms at that time. She followed up for a little bit with the cardiologist and stop doing that for a long time now. The event was sometime back in 2007.  Hypertension BP is well controlled. Continue monitoring BP. Continue current medical therapy and lifestyle changes.  Hyperlipidemia Ideal LDL goal is less than 70 for history of diabetes Patient very reluctant to try any statin medication due to history of severe body pains from taking statin medication. Patient cannot tell me exactly which side and education she was on. Lifestyle changes recommended, ideally once ischemia evaluation has been done.  Obesity Body mass index is 32.13 kg/(m^2).Marland Kitchen Recommend aggressive weight loss through diet and increased physical activity, once cardiac workup has been on  Current medicines are reviewed at length with the patient today.  The patient does not have concerns regarding medicines.  Labs/ tests ordered today include:  Orders Placed  This Encounter  Procedures  . NM Myocar Multi W/Spect W/Wall Motion / EF    I had a lengthy and detailed discussion with the patient regarding diagnoses, prognosis, diagnostic options, treatment options .   I counseled the patient on importance of lifestyle modification including heart healthy diet, regular physical activity .  Disposition:   FU with undersigned after tests.  Signed, Wende Bushy, MD  12/12/2015 10:16 AM    [place]

## 2015-12-18 ENCOUNTER — Ambulatory Visit
Admission: RE | Admit: 2015-12-18 | Discharge: 2015-12-18 | Disposition: A | Discharge status: Home / Self Care | Payer: PPO | Source: Ambulatory Visit | Attending: Cardiology | Admitting: Cardiology

## 2015-12-18 DIAGNOSIS — R0602 Shortness of breath: Secondary | ICD-10-CM | POA: Diagnosis not present

## 2015-12-18 HISTORY — DX: Type 2 diabetes mellitus without complications: E11.9

## 2015-12-18 HISTORY — DX: Essential (primary) hypertension: I10

## 2015-12-18 LAB — NM MYOCAR MULTI W/SPECT W/WALL MOTION / EF
CHL CUP RESTING HR STRESS: 71 {beats}/min
CHL CUP STRESS STAGE 1 HR: 88 {beats}/min
CHL CUP STRESS STAGE 3 GRADE: 0 %
CHL CUP STRESS STAGE 3 SPEED: 0.1 mph
CHL CUP STRESS STAGE 4 GRADE: 10 %
CHL CUP STRESS STAGE 4 HR: 133 {beats}/min
CHL CUP STRESS STAGE 4 SBP: 190 mmHg
CHL CUP STRESS STAGE 4 SPEED: 1.7 mph
CHL CUP STRESS STAGE 5 HR: 99 {beats}/min
CHL CUP STRESS STAGE 6 DBP: 80 mmHg
CHL CUP STRESS STAGE 6 SPEED: 0 mph
CSEPED: 3 min
CSEPEDS: 0 s
CSEPEW: 4.6 METS
CSEPHR: 86 %
CSEPPBP: 190 mmHg
LVDIAVOL: 73 mL (ref 46–106)
LVSYSVOL: 26 mL
NUC STRESS TID: 0.96
Peak HR: 133 {beats}/min
Percent of predicted max HR: 86 %
SDS: 0
SRS: 2
SSS: 3
Stage 2 Grade: 0 %
Stage 2 HR: 88 {beats}/min
Stage 2 Speed: 0.1 mph
Stage 3 HR: 88 {beats}/min
Stage 4 DBP: 71 mmHg
Stage 5 Grade: 0 %
Stage 5 Speed: 0 mph
Stage 6 Grade: 0 %
Stage 6 HR: 86 {beats}/min
Stage 6 SBP: 154 mmHg

## 2015-12-18 MED ORDER — TECHNETIUM TC 99M SESTAMIBI - CARDIOLITE
30.7300 | Freq: Once | INTRAVENOUS | Status: AC | PRN
  Administered 2015-12-18: 30.73 via INTRAVENOUS

## 2015-12-18 MED ORDER — TECHNETIUM TC 99M SESTAMIBI - CARDIOLITE
13.0000 | Freq: Once | INTRAVENOUS | Status: AC | PRN
  Administered 2015-12-18: 08:00:00 12.92 via INTRAVENOUS

## 2015-12-30 ENCOUNTER — Ambulatory Visit (INDEPENDENT_AMBULATORY_CARE_PROVIDER_SITE_OTHER): Payer: PPO | Admitting: Cardiology

## 2015-12-30 ENCOUNTER — Encounter (INDEPENDENT_AMBULATORY_CARE_PROVIDER_SITE_OTHER): Payer: Self-pay

## 2015-12-30 ENCOUNTER — Encounter: Payer: Self-pay | Admitting: Cardiology

## 2015-12-30 VITALS — BP 130/80 | HR 86 | Ht 66.0 in | Wt 200.8 lb

## 2015-12-30 DIAGNOSIS — I10 Essential (primary) hypertension: Secondary | ICD-10-CM

## 2015-12-30 DIAGNOSIS — I2510 Atherosclerotic heart disease of native coronary artery without angina pectoris: Secondary | ICD-10-CM | POA: Diagnosis not present

## 2015-12-30 DIAGNOSIS — R0602 Shortness of breath: Secondary | ICD-10-CM | POA: Diagnosis not present

## 2015-12-30 DIAGNOSIS — E785 Hyperlipidemia, unspecified: Secondary | ICD-10-CM

## 2015-12-30 MED ORDER — METOPROLOL SUCCINATE ER 25 MG PO TB24: 25.0000 mg | ORAL_TABLET | Freq: Every day | ORAL | Status: DC

## 2015-12-30 NOTE — Patient Instructions (Addendum)
Medication Instructions:  Your physician has recommended you make the following change in your medication:   1. Start Toprol XL 25 mg once daily   Labwork: None ordered  Testing/Procedures: None ordered  Follow-Up: Your physician wants you to follow-up in: 6 months with Dr. Yvone Neu. You will receive a reminder letter in the mail two months in advance. If you don't receive a letter, please call our office to schedule the follow-up appointment.   Any Other Special Instructions Will Be Listed Below (If Applicable).  Follow up with your primary care provider for your blood sugar management.    If you need a refill on your cardiac medications before your next appointment, please call your pharmacy.  Exercising to Stay Healthy Exercising regularly is important. It has many health benefits, such as:  Improving your overall fitness, flexibility, and endurance.  Increasing your bone density.  Helping with weight control.  Decreasing your body fat.  Increasing your muscle strength.  Reducing stress and tension.  Improving your overall health. In order to become healthy and stay healthy, it is recommended that you do moderate-intensity and vigorous-intensity exercise. You can tell that you are exercising at a moderate intensity if you have a higher heart rate and faster breathing, but you are still able to hold a conversation. You can tell that you are exercising at a vigorous intensity if you are breathing much harder and faster and cannot hold a conversation while exercising. HOW OFTEN SHOULD I EXERCISE? Choose an activity that you enjoy and set realistic goals. Your health care provider can help you to make an activity plan that works for you. Exercise regularly as directed by your health care provider. This may include:   Doing resistance training twice each week, such as:  Push-ups.  Sit-ups.  Lifting weights.  Using resistance bands.  Doing a given intensity of exercise  for a given amount of time. Choose from these options:  150 minutes of moderate-intensity exercise every week.  75 minutes of vigorous-intensity exercise every week.  A mix of moderate-intensity and vigorous-intensity exercise every week. Children, pregnant women, people who are out of shape, people who are overweight, and older adults may need to consult a health care provider for individual recommendations. If you have any sort of medical condition, be sure to consult your health care provider before starting a new exercise program.  WHAT ARE SOME EXERCISE IDEAS? Some moderate-intensity exercise ideas include:   Walking at a rate of 1 mile in 15 minutes.  Biking.  Hiking.  Golfing.  Dancing. Some vigorous-intensity exercise ideas include:   Walking at a rate of at least 4.5 miles per hour.  Jogging or running at a rate of 5 miles per hour.  Biking at a rate of at least 10 miles per hour.  Lap swimming.  Roller-skating or in-line skating.  Cross-country skiing.  Vigorous competitive sports, such as football, basketball, and soccer.  Jumping rope.  Aerobic dancing. WHAT ARE SOME EVERYDAY ACTIVITIES THAT CAN HELP ME TO GET EXERCISE?  Yard work, such as:  Psychologist, educational.  Raking and bagging leaves.  Washing and waxing your car.  Pushing a stroller.  Shoveling snow.  Gardening.  Washing windows or floors. HOW CAN I BE MORE ACTIVE IN MY DAY-TO-DAY ACTIVITIES?  Use the stairs instead of the elevator.  Take a walk during your lunch break.  If you drive, park your car farther away from work or school.  If you take public transportation, get off  one stop early and walk the rest of the way.  Make all of your phone calls while standing up and walking around.  Get up, stretch, and walk around every 30 minutes throughout the day. WHAT GUIDELINES SHOULD I FOLLOW WHILE EXERCISING?  Do not exercise so much that you hurt yourself, feel dizzy, or get very  short of breath.  Consult your health care provider before starting a new exercise program.  Wear comfortable clothes and shoes with good support.  Drink plenty of water while you exercise to prevent dehydration or heat stroke. Body water is lost during exercise and must be replaced.  Work out until you breathe faster and your heart beats faster.   This information is not intended to replace advice given to you by your health care provider. Make sure you discuss any questions you have with your health care provider.   Document Released: 09/26/2010 Document Revised: 09/14/2014 Document Reviewed: 01/25/2014 Elsevier Interactive Patient Education 2016 Elsevier Inc[employer]   Low-Sodium Eating Plan Sodium raises blood pressure and causes water to be held in the body. Getting less sodium from food will help lower your blood pressure, reduce any swelling, and protect your heart, liver, and kidneys. We get sodium by adding salt (sodium chloride) to food. Most of our sodium comes from canned, boxed, and frozen foods. Restaurant foods, fast foods, and pizza are also very high in sodium. Even if you take medicine to lower your blood pressure or to reduce fluid in your body, getting less sodium from your food is important. WHAT IS MY PLAN? Most people should limit their sodium intake to 2,300 mg a day. Your health care provider recommends that you limit your sodium intake to __________ a day.  WHAT DO I NEED TO KNOW ABOUT THIS EATING PLAN? For the low-sodium eating plan, you will follow these general guidelines:  Choose foods with a % Daily Value for sodium of less than 5% (as listed on the food label).   Use salt-free seasonings or herbs instead of table salt or sea salt.   Check with your health care provider or pharmacist before using salt substitutes.   Eat fresh foods.  Eat more vegetables and fruits.  Limit canned vegetables. If you do use them, rinse them well to decrease the sodium.    Limit cheese to 1 oz (28 g) per day.   Eat lower-sodium products, often labeled as "lower sodium" or "no salt added."  Avoid foods that contain monosodium glutamate (MSG). MSG is sometimes added to [place] food and some canned foods.  Check food labels (Nutrition Facts labels) on foods to learn how much sodium is in one serving.  Eat more home-cooked food and less restaurant, buffet, and fast food.  When eating at a restaurant, ask that your food be prepared with less salt, or no salt if possible.  HOW DO I READ FOOD LABELS FOR SODIUM INFORMATION? The Nutrition Facts label lists the amount of sodium in one serving of the food. If you eat more than one serving, you must multiply the listed amount of sodium by the number of servings. Food labels may also identify foods as:  Sodium free--Less than 5 mg in a serving.  Very low sodium--35 mg or less in a serving.  Low sodium--140 mg or less in a serving.  Light in sodium--50% less sodium in a serving. For example, if a food that usually has 300 mg of sodium is changed to become light in sodium, it will have 150  mg of sodium.  Reduced sodium--25% less sodium in a serving. For example, if a food that usually has 400 mg of sodium is changed to reduced sodium, it will have 300 mg of sodium. WHAT FOODS CAN I EAT? Grains Low-sodium cereals, including oats, puffed wheat and rice, and shredded wheat cereals. Low-sodium crackers. Unsalted rice and pasta. Lower-sodium bread.  Vegetables Frozen or fresh vegetables. Low-sodium or reduced-sodium canned vegetables. Low-sodium or reduced-sodium tomato sauce and paste. Low-sodium or reduced-sodium tomato and vegetable juices.  Fruits Fresh, frozen, and canned fruit. Fruit juice.  Meat and Other Protein Products Low-sodium canned tuna and salmon. Fresh or frozen meat, poultry, seafood, and fish. Lamb. Unsalted nuts. Dried beans, peas, and lentils without added salt. Unsalted canned beans.  Homemade soups without salt. Eggs.  Dairy Milk. Soy milk. Ricotta cheese. Low-sodium or reduced-sodium cheeses. Yogurt.  Condiments Fresh and dried herbs and spices. Salt-free seasonings. Onion and garlic powders. Low-sodium varieties of mustard and ketchup. Fresh or refrigerated horseradish. Lemon juice.  Fats and Oils Reduced-sodium salad dressings. Unsalted butter.  Other Unsalted popcorn and pretzels.  The items listed above may not be a complete list of recommended foods or beverages. Contact your dietitian for more options. WHAT FOODS ARE NOT RECOMMENDED? Grains Instant hot cereals. Bread stuffing, pancake, and biscuit mixes. Croutons. Seasoned rice or pasta mixes. Noodle soup cups. Boxed or frozen macaroni and cheese. Self-rising flour. Regular salted crackers. Vegetables Regular canned vegetables. Regular canned tomato sauce and paste. Regular tomato and vegetable juices. Frozen vegetables in sauces. Salted [place] fries. Olives. Angie Fava. Relishes. Sauerkraut. Salsa. Meat and Other Protein Products Salted, canned, smoked, spiced, or pickled meats, seafood, or fish. Bacon, ham, sausage, hot dogs, corned beef, chipped beef, and packaged luncheon meats. Salt pork. Jerky. Pickled herring. Anchovies, regular canned tuna, and sardines. Salted nuts. Dairy Processed cheese and cheese spreads. Cheese curds. Blue cheese and cottage cheese. Buttermilk.  Condiments Onion and garlic salt, seasoned salt, table salt, and sea salt. Canned and packaged gravies. Worcestershire sauce. Tartar sauce. Barbecue sauce. Teriyaki sauce. Soy sauce, including reduced sodium. Steak sauce. Fish sauce. Oyster sauce. Cocktail sauce. Horseradish that you find on the shelf. Regular ketchup and mustard. Meat flavorings and tenderizers. Bouillon cubes. Hot sauce. Tabasco sauce. Marinades. Taco seasonings. Relishes. Fats and Oils Regular salad dressings. Salted butter. Margarine. Ghee. Bacon fat.   Other Potato and tortilla chips. Corn chips and puffs. Salted popcorn and pretzels. Canned or dried soups. Pizza. Frozen entrees and pot pies.  The items listed above may not be a complete list of foods and beverages to avoid. Contact your dietitian for more information.   This information is not intended to replace advice given to you by your health care provider. Make sure you discuss any questions you have with your health care provider.   Document Released: 02/13/2002 Document Revised: 09/14/2014 Document Reviewed: 06/28/2013 Elsevier Interactive Patient Education [employer].

## 2015-12-30 NOTE — Progress Notes (Signed)
Cardiology Office Note   Date:  12/30/2015   ID:  Leslie Olsen, DOB 29-Oct-1950, MRN [id number]  Referring Doctor:  Park Liter, DO   Cardiologist:   Wende Bushy, MD   Reason for consultation:  Chief Complaint  Patient presents with  . other    F/u stress test c/o sob. Meds reviewed verbally with pt.      History of Present Illness: Leslie Olsen is a 65 y.o. female who presents for Follow-up after tests.  Her shortness of breath is stable from last time. No worsening of her symptoms. No chest pain, loss of consciousness.  No complaints of headache, fever, cough, colds, orthopnea, PND, edema.  ROS:  Please see the history of present illness. Aside from mentioned under HPI, all other systems are reviewed and negative.    Past Medical History  Diagnosis Date  . COPD (chronic obstructive pulmonary disease) ([place])   . GERD (gastroesophageal reflux disease)   . Hypothyroidism   . Anemia   . Chronic headaches   . Hypertension   . Diabetes mellitus without complication [facility])     Past Surgical History  Procedure Laterality Date  . Abdominal hysterectomy    . Back surgery    . Shoulder surgery    . Coronary stent placement    . Tibia fracture surgery    . Replacement total knee    . Cholecystectomy    . Reduction mammaplasty       reports that she quit smoking about 4 years ago. Her smoking use included Cigarettes. She has a 94 pack-year smoking history. She has never used smokeless tobacco. She reports that she does not drink alcohol or use illicit drugs.   family history includes Breast cancer (age of onset: 29) in her maternal aunt; Lung cancer in her maternal aunt; Other in her father and mother; Thyroid disease in her brother and sister.   Current Outpatient Prescriptions  Medication Sig Dispense Refill  . albuterol (PROVENTIL HFA;VENTOLIN HFA) 108 (90 Base) MCG/ACT inhaler Inhale 2 puffs into the lungs every 6 (six) hours as needed for wheezing.  6.7 g 5  . aspirin 81 MG tablet Take 81 mg by mouth daily.    . budesonide-formoterol (SYMBICORT) 160-4.5 MCG/ACT inhaler Inhale 1 puff into the lungs 2 (two) times daily. 1 Inhaler 5  . ferrous fumarate (HEMOCYTE - 106 MG FE) 325 (106 FE) MG TABS tablet Take 1 tablet by mouth daily.    Marland Kitchen gabapentin (NEURONTIN) 300 MG capsule Take 1 capsule (300 mg total) by mouth 3 (three) times daily. 90 capsule 3  . glucose blood (COOL BLOOD GLUCOSE TEST STRIPS) test strip Use as instructed 100 each 12  . levothyroxine (SYNTHROID, LEVOTHROID) 75 MCG tablet Take 1 tablet (75 mcg total) by mouth daily. Brand name required, come back in 6 weeks for lab recheck 30 tablet 1  . lisinopril (PRINIVIL,ZESTRIL) 5 MG tablet Take 1 tablet (5 mg total) by mouth daily. 90 tablet 1  . metFORMIN (GLUCOPHAGE) 500 MG tablet Take 1 tablet (500 mg total) by mouth 2 (two) times daily with a meal. 360 tablet 1  . Omega-3 Fatty Acids (FISH OIL) 1200 MG CAPS Take 1 capsule by mouth daily.    . Red Yeast Rice 600 MG CAPS Take 2 capsules by mouth daily.    . SUMAtriptan (IMITREX) 100 MG tablet Take 100 mg by mouth every 2 (two) hours as needed for migraine. May repeat in 2 hours if headache persists  or recurs.    . metoprolol succinate (TOPROL XL) 25 MG 24 hr tablet Take 1 tablet (25 mg total) by mouth daily. 30 tablet 6   No current facility-administered medications for this visit.    Allergies: Review of patient's allergies indicates no known allergies.    PHYSICAL EXAM: VS:  BP 130/80 mmHg  Pulse 86  Ht 5\' 6"  (1.676 m)  Wt 200 lb 12 oz (91.06 kg)  BMI 32.42 kg/m2 , Body mass index is 32.42 kg/(m^2). Wt Readings from Last 3 Encounters:  12/30/15 200 lb 12 oz (91.06 kg)  12/12/15 199 lb (90.266 kg)  11/27/15 198 lb 3.2 oz (89.903 kg)    GENERAL:  well developed, well nourished,  obese, not in acute distress HEENT: normocephalic, pink conjunctivae, anicteric sclerae, no xanthelasma, normal dentition, oropharynx clear NECK:   no neck vein engorgement, JVP normal, no hepatojugular reflux, carotid upstroke brisk and symmetric, no bruit, no thyromegaly, no lymphadenopathy LUNGS:  good respiratory effort, clear to auscultation bilaterally CV:  PMI not displaced, no thrills, no lifts, S1 and S2 within normal limits, no palpable S3 or S4, no murmurs, no rubs, no gallops ABD:  Soft, nontender, nondistended, normoactive bowel sounds, no abdominal aortic bruit, no hepatomegaly, no splenomegaly MS: nontender back, no kyphosis, no scoliosis, no joint deformities EXT:  2+ DP/PT pulses, no edema, no varicosities, no cyanosis, no clubbing SKIN: warm, nondiaphoretic, normal turgor, no ulcers NEUROPSYCH: alert, oriented to person, place, and time, sensory/motor grossly intact, normal mood, appropriate affect  Recent Labs: 10/11/2015: ALT 12; BUN 14; Creatinine, Ser 0.86; Potassium 4.8; Sodium 141;  11/19/2015: Hemoglobin 11.6*; Platelets 291   Lipid Panel    Component Value Date/Time   CHOL 244* 07/11/2015 1500   CHOL 218* 12/17/2014 0414   TRIG 451* 07/11/2015 1500   TRIG 47 12/17/2014 0414   HDL 40 07/11/2015 1500   HDL 54 12/17/2014 0414   VLDL 9 12/17/2014 0414   LDLCALC Comment 07/11/2015 1500   LDLCALC 155* 12/17/2014 0414     Other studies Reviewed:  EKG:  The ekg from 12/12/2015 was personally reviewed by me and it revealed sinus rhythm, sinus arrhythmia, 66 BPM. Low-voltage QRS.  Additional studies/ records that were reviewed personally reviewed by me today include:   Echo 11/27/2015: Left ventricle: The cavity size was normal. There was mild focal  basal hypertrophy of the septum. Systolic function was normal.  The estimated ejection fraction was in the range of 60% to 65%.  Wall motion was normal; there were no regional wall motion  abnormalities. Doppler parameters are consistent with abnormal  left ventricular relaxation (grade 1 diastolic dysfunction). - Left atrium: The atrium was normal in  size. - Right ventricle: Systolic function was normal. - Pulmonary arteries: Systolic pressure was within the normal  range.  Exercise nuclear stress test 12/18/2015: Exercise myocardial perfusion imaging study with no significant ischemia Normal wall motion, EF estimated at 50% No EKG changes concerning for ischemia at peak stress or in recovery. Poor exercise tolerance, exercised for 3 minutes total, 4.6 METS, Target heart rate achieved Low risk scan    ASSESSMENT AND PLAN:  Shortness of breath Discuss results of exercise nuclear stress test. Reassured patient that there was no significant ischemia noted on the stress test. Discussed possibility of deconditioning. This is likely contribution to into her symptoms of shortness of breath, which is likely mainly from COPD.   CAD  Patient shows me today the card of the coronary stent: RCA 08/28/2003  Cypher stent 3.5 x 28 mm. Patient says that there is no way that was done 2004 and she believes it was most likely 2007. Patient has no angina. Continue medical therapy. Recommend trial of beta blocker.  LDL goal is less than 70.  Hypertension BP is well controlled. Continue monitoring BP with initiating beta blocker. Continue current medical therapy and lifestyle changes.  Hyperlipidemia Ideal LDL goal is less than 70 for history of diabetes Patient very reluctant to try any statin medication due to history of severe body pains from taking statin medication. Patient cannot tell me exactly which side and education she was on. Lifestyle changes recommended. Patient will follow-up with her PCP regarding statin use.  Obesity Body mass index is 32.42 kg/(m^2).Marland Kitchen Recommend aggressive weight loss through diet and increased physical activity.  Current medicines are reviewed at length with the patient today.  The patient does not have concerns regarding medicines.  Labs/ tests ordered today include:  No orders of the defined types were  placed in this encounter.    I had a lengthy and detailed discussion with the patient regarding diagnoses, prognosis, diagnostic options, treatment options .   I counseled the patient on importance of lifestyle modification including heart healthy diet, regular physical activity .  Disposition:   FU with undersigned after tests.  Signed, Wende Bushy, MD  12/30/2015 10:30 AM    [place]

## 2016-01-03 ENCOUNTER — Telehealth: Payer: Self-pay

## 2016-01-03 NOTE — Telephone Encounter (Signed)
[facility] is requesting a 90 day Rx for (original Rx was filled at [facility])  Lisinopril 5mg  TAB  #90  Patient does not have a scheduled f/u appointment, was seen 10/11/15

## 2016-01-06 ENCOUNTER — Ambulatory Visit: Payer: PPO | Admitting: Internal Medicine

## 2016-01-06 MED ORDER — LISINOPRIL 5 MG PO TABS: 5.0000 mg | ORAL_TABLET | Freq: Every day | ORAL | Status: DC

## 2016-01-06 NOTE — Telephone Encounter (Signed)
Rx sent to her pharmacy 

## 2016-01-07 ENCOUNTER — Encounter (INDEPENDENT_AMBULATORY_CARE_PROVIDER_SITE_OTHER): Payer: Self-pay

## 2016-01-07 ENCOUNTER — Encounter: Payer: Self-pay | Admitting: Internal Medicine

## 2016-01-07 ENCOUNTER — Ambulatory Visit (INDEPENDENT_AMBULATORY_CARE_PROVIDER_SITE_OTHER): Payer: PPO | Admitting: Internal Medicine

## 2016-01-07 VITALS — BP 140/88 | HR 95 | Ht 66.0 in | Wt 200.0 lb

## 2016-01-07 DIAGNOSIS — J438 Other emphysema: Secondary | ICD-10-CM

## 2016-01-07 DIAGNOSIS — Z9989 Dependence on other enabling machines and devices: Principal | ICD-10-CM

## 2016-01-07 DIAGNOSIS — G4733 Obstructive sleep apnea (adult) (pediatric): Secondary | ICD-10-CM

## 2016-01-07 DIAGNOSIS — J449 Chronic obstructive pulmonary disease, unspecified: Secondary | ICD-10-CM | POA: Diagnosis not present

## 2016-01-07 NOTE — Patient Instructions (Signed)
Follow up with Dr. Stevenson Clinch in:3 months - cont with symbicort, -gargle and rinse after each use.  - we will setup referral for pulmonary rehab - cont with allergy avoidance - keep appointment with heme/onc

## 2016-01-07 NOTE — Progress Notes (Signed)
Subjective:    Patient ID: Leslie Olsen, female    DOB: March 24, 1951, 65 y.o.   MRN: [id number]  Synopsis: This is a 65 year old female with GOLD grade C COPD who first saw the [facility] pulmonary clinic in early 2014 for evaluation of the same. She was hospitalized for an exacerbation of COPD in March 2013, October 2013. She also has obstructive sleep apnea and uses CPAP. She had been intolerant of Roflumilast as it caused diarrhea. She previously smoked 2 packs of cigarettes daily for 30 years and quit in March of 2013. 11/01/2012 simple spirometry ratio 73%, FEV1 1.94% (76% predicted) flow volume loop consistent with obstruction 02/07/2013 Simple spirometry > ratio 71%, FEV1 2.1 L (83% predicted) 11/27/2015 Simple spirometry > ratio 74%, FEV1 2.0 L (81% predicted) flow volume loop consistent with obstruction  HPI Chief Complaint  Patient presents with  . Follow-up    went to cardiology; SOB at different times; no c/o chest tightness/pain; not wearing CPAP as she should due to HAs.  She presents today for follow-up visit of shortness of breath with rest and adequate with exertion. She's had shortness of breath intermittently over the past couple of the days mostly attributes to iron deficiency anemia, but it overall improvement.  She was been seen by cardiology since her last visit, had a nuclear stress test along with echocardiogram, findings reviewed the patient, majority of the findings were normal significant abnormalities on those exams. She states that over the past 2 weeks she's been wearing her CPAP nightly, for about 4-6 hours. Overall she still has intermittent episodes of chest tightness and shortness of breath which is relieved at times with albuterol use. She states she is albuterol maybe 2-3 times per week since her last visit. Patient has seen cardiology, noted to have sinus tachycardia with rate in the 90s, advised to start metoprolol, but patient stated that she did  not want to do this at this time.  Past Medical History  Diagnosis Date  . COPD (chronic obstructive pulmonary disease) ([place])   . GERD (gastroesophageal reflux disease)   . Hypothyroidism   . Anemia   . Chronic headaches   . Hypertension   . Diabetes mellitus without complication ([place])      Review of Systems  Constitutional: Negative for fever, chills and fatigue.  HENT: Negative for congestion, nosebleeds, postnasal drip, rhinorrhea, sinus pressure and voice change.   Respiratory: Positive for shortness of breath. Negative for choking and chest tightness.   Cardiovascular: Negative for chest pain, palpitations and leg swelling.  Endocrine: Negative for cold intolerance and heat intolerance.  Genitourinary: Negative for difficulty urinating.  Neurological: Positive for light-headedness and headaches.  Hematological: Does not bruise/bleed easily.       Objective:   Physical Exam  Constitutional: She is oriented to person, place, and time. She appears well-developed and well-nourished.  HENT:  Head: Normocephalic and atraumatic.  Right Ear: External ear normal.  Left Ear: External ear normal.  Nose: Nose normal.  Mouth/Throat: Oropharynx is clear and moist.  Eyes: Conjunctivae and EOM are normal. Pupils are equal, round, and reactive to light.  Neck: Neck supple.  Cardiovascular: Normal rate, regular rhythm, normal heart sounds and intact distal pulses.   Pulmonary/Chest: Effort normal and breath sounds normal. No respiratory distress. She has no wheezes. She has no rales.  Abdominal: Soft. Bowel sounds are normal. She exhibits no distension.  Musculoskeletal: Normal range of motion. She exhibits no edema.  Neurological: She is alert and  oriented to person, place, and time.  Skin: Skin is warm and dry.  Nursing note and vitals reviewed.   Filed Vitals:   01/07/16 0845  BP: 140/88  Pulse: 95  Height: 5\' 6"  (1.676 m)  Weight: 200 lb (90.719 kg)  SpO2: 100%    RA  Gen: well appearing, no acute distress HEENT: NCAT, EOMi, OP clear,  PULM: CTA B CV: RRR, slight systolic murmur, no JVD AB: BS+, soft, nontender, no hsm Ext: warm, no edema, no clubbing, no cyanosis Neuro: speech is clear and fluent today  Imaging: (The following images and results were reviewed by Dr. Stevenson Clinch on 01/07/2016). CXR 3/2//17 CHEST 2 VIEW  COMPARISON: Chest x-ray of 12/16/2014  FINDINGS: No active infiltrate or effusion is seen. Mediastinal and hilar contours are unremarkable. The heart is borderline enlarged. No bony abnormality is seen.  IMPRESSION: No active cardiopulmonary disease.  ECHO 11/2015 Study Conclusions  - Left ventricle: The cavity size was normal. There was mild focal  basal hypertrophy of the septum. Systolic function was normal.  The estimated ejection fraction was in the range of 60% to 65%.  Wall motion was normal; there were no regional wall motion  abnormalities. Doppler parameters are consistent with abnormal  left ventricular relaxation (grade 1 diastolic dysfunction). - Left atrium: The atrium was normal in size. - Right ventricle: Systolic function was normal. - Pulmonary arteries: Systolic pressure was within the normal  range. Impressions: - Normal study.  Exercise nuclear stress test 12/18/2015: Exercise myocardial perfusion imaging study with no significant ischemia Normal wall motion, EF estimated at 50% No EKG changes concerning for ischemia at peak stress or in recovery. Poor exercise tolerance, exercised for 3 minutes total, 4.6 METS, Target heart rate achieved Low risk scan     Assessment & Plan:  Patient with COPD seen for follow-up visit.   COPD (chronic obstructive pulmonary disease) ([place]) This has been a stable interval for [place]. She only has mild COPD and today on exam her lungs sound normal. Her oximetry is normal. I explained to her today that I do not think that her lung disease is the  cause of her generalized fatigue dyspnea. Otherwise she should continue taking her Symbicort for her mild COPD. She had an ONO done last year which was normal.  Spirometry 3/22 showed > ratio 74%, FEV1 2.0 L (81% predicted) - unchanged from 2014, very mild obstruction. Her COPD overall is optimized, and well control. She has had further workup for her intermittent shortness of breath and chest tightness, which included nuclear stress test and echocardiogram, seen by cardiology, noted to be normal exam.   Plan: - Highly unlikely that COPD is causing her chest tightness/dyspnea, given her level of dyspnea, will treat as a mild copd exacerbation - cont with symbicort - cont with rescue inhaler.  -Referral to pulmonary rehabilitation       Updated Medication List Outpatient Encounter Prescriptions as of 01/07/2016  Medication Sig  . albuterol (PROVENTIL HFA;VENTOLIN HFA) 108 (90 Base) MCG/ACT inhaler Inhale 2 puffs into the lungs every 6 (six) hours as needed for wheezing.  Marland Kitchen aspirin 81 MG tablet Take 81 mg by mouth daily.  . budesonide-formoterol (SYMBICORT) 160-4.5 MCG/ACT inhaler Inhale 1 puff into the lungs 2 (two) times daily.  . ferrous fumarate (HEMOCYTE - 106 MG FE) 325 (106 FE) MG TABS tablet Take 1 tablet by mouth daily.  Marland Kitchen gabapentin (NEURONTIN) 300 MG capsule Take 1 capsule (300 mg total) by mouth 3 (three) times  daily.  . glucose blood (COOL BLOOD GLUCOSE TEST STRIPS) test strip Use as instructed  . levothyroxine (SYNTHROID, LEVOTHROID) 75 MCG tablet Take 1 tablet (75 mcg total) by mouth daily. Brand name required, come back in 6 weeks for lab recheck  . lisinopril (PRINIVIL,ZESTRIL) 5 MG tablet Take 1 tablet (5 mg total) by mouth daily.  . metFORMIN (GLUCOPHAGE) 500 MG tablet Take 1 tablet (500 mg total) by mouth 2 (two) times daily with a meal.  . metoprolol succinate (TOPROL XL) 25 MG 24 hr tablet Take 1 tablet (25 mg total) by mouth daily.  . Omega-3 Fatty Acids (FISH OIL)  1200 MG CAPS Take 1 capsule by mouth daily.  . Red Yeast Rice 600 MG CAPS Take 2 capsules by mouth daily.  . SUMAtriptan (IMITREX) 100 MG tablet Take 100 mg by mouth every 2 (two) hours as needed for migraine. May repeat in 2 hours if headache persists or recurs.   No facility-administered encounter medications on file as of 01/07/2016.

## 2016-01-07 NOTE — Assessment & Plan Note (Signed)
This has been a stable interval for Leslie Olsen. She only has mild COPD and today on exam her lungs sound normal. Her oximetry is normal. I explained to her today that I do not think that her lung disease is the cause of her generalized fatigue dyspnea. Otherwise she should continue taking her Symbicort for her mild COPD. She had an ONO done last year which was normal.  Spirometry 3/22 showed > ratio 74%, FEV1 2.0 L (81% predicted) - unchanged from 2014, very mild obstruction. Her COPD overall is optimized, and well control. She has had further workup for her intermittent shortness of breath and chest tightness, which included nuclear stress test and echocardiogram, seen by cardiology, noted to be normal exam.   Plan: - Highly unlikely that COPD is causing her chest tightness/dyspnea, given her level of dyspnea, will treat as a mild copd exacerbation - cont with symbicort - cont with rescue inhaler.  -Referral to pulmonary rehabilitation

## 2016-01-15 DIAGNOSIS — G4733 Obstructive sleep apnea (adult) (pediatric): Secondary | ICD-10-CM | POA: Diagnosis not present

## 2016-01-15 DIAGNOSIS — M5481 Occipital neuralgia: Secondary | ICD-10-CM | POA: Diagnosis not present

## 2016-01-20 ENCOUNTER — Ambulatory Visit: Payer: PPO

## 2016-01-20 ENCOUNTER — Other Ambulatory Visit: Payer: PPO

## 2016-01-20 ENCOUNTER — Ambulatory Visit: Payer: PPO | Admitting: Hematology and Oncology

## 2016-01-22 ENCOUNTER — Inpatient Hospital Stay (HOSPITAL_BASED_OUTPATIENT_CLINIC_OR_DEPARTMENT_OTHER): Payer: PPO | Admitting: Hematology and Oncology

## 2016-01-22 ENCOUNTER — Inpatient Hospital Stay: Payer: PPO | Attending: Hematology and Oncology

## 2016-01-22 ENCOUNTER — Inpatient Hospital Stay: Payer: PPO

## 2016-01-22 VITALS — BP 137/81 | HR 86 | Temp 97.2°F | Resp 18 | Ht 66.0 in | Wt 202.2 lb

## 2016-01-22 DIAGNOSIS — J432 Centrilobular emphysema: Secondary | ICD-10-CM | POA: Insufficient documentation

## 2016-01-22 DIAGNOSIS — Z7982 Long term (current) use of aspirin: Secondary | ICD-10-CM | POA: Insufficient documentation

## 2016-01-22 DIAGNOSIS — E039 Hypothyroidism, unspecified: Secondary | ICD-10-CM

## 2016-01-22 DIAGNOSIS — I10 Essential (primary) hypertension: Secondary | ICD-10-CM | POA: Insufficient documentation

## 2016-01-22 DIAGNOSIS — Z9989 Dependence on other enabling machines and devices: Secondary | ICD-10-CM | POA: Insufficient documentation

## 2016-01-22 DIAGNOSIS — D509 Iron deficiency anemia, unspecified: Secondary | ICD-10-CM | POA: Diagnosis not present

## 2016-01-22 DIAGNOSIS — Z7984 Long term (current) use of oral hypoglycemic drugs: Secondary | ICD-10-CM

## 2016-01-22 DIAGNOSIS — R918 Other nonspecific abnormal finding of lung field: Secondary | ICD-10-CM | POA: Insufficient documentation

## 2016-01-22 DIAGNOSIS — I279 Pulmonary heart disease, unspecified: Secondary | ICD-10-CM | POA: Insufficient documentation

## 2016-01-22 DIAGNOSIS — E119 Type 2 diabetes mellitus without complications: Secondary | ICD-10-CM

## 2016-01-22 DIAGNOSIS — Z87891 Personal history of nicotine dependence: Secondary | ICD-10-CM

## 2016-01-22 DIAGNOSIS — Z801 Family history of malignant neoplasm of trachea, bronchus and lung: Secondary | ICD-10-CM | POA: Diagnosis not present

## 2016-01-22 DIAGNOSIS — Z79899 Other long term (current) drug therapy: Secondary | ICD-10-CM

## 2016-01-22 DIAGNOSIS — R5383 Other fatigue: Secondary | ICD-10-CM

## 2016-01-22 DIAGNOSIS — Z95818 Presence of other cardiac implants and grafts: Secondary | ICD-10-CM | POA: Insufficient documentation

## 2016-01-22 DIAGNOSIS — K648 Other hemorrhoids: Secondary | ICD-10-CM | POA: Insufficient documentation

## 2016-01-22 DIAGNOSIS — Z803 Family history of malignant neoplasm of breast: Secondary | ICD-10-CM | POA: Insufficient documentation

## 2016-01-22 LAB — CBC WITH DIFFERENTIAL/PLATELET
Basophils Absolute: 0.1 10*3/uL (ref 0–0.1)
Basophils Relative: 1 %
Eosinophils Absolute: 0.2 10*3/uL (ref 0–0.7)
Eosinophils Relative: 4 %
HCT: 37.3 % (ref 35.0–47.0)
Hemoglobin: 12.3 g/dL (ref 12.0–16.0)
Lymphocytes Relative: 35 %
Lymphs Abs: 1.7 10*3/uL (ref 1.0–3.6)
MCH: 28.3 pg (ref 26.0–34.0)
MCHC: 32.9 g/dL (ref 32.0–36.0)
MCV: 85.9 fL (ref 80.0–100.0)
Monocytes Absolute: 0.4 10*3/uL (ref 0.2–0.9)
Monocytes Relative: 7 %
Neutro Abs: 2.5 10*3/uL (ref 1.4–6.5)
Neutrophils Relative %: 53 %
Platelets: 256 10*3/uL (ref 150–440)
RBC: 4.34 MIL/uL (ref 3.80–5.20)
RDW: 19.6 % — ABNORMAL HIGH (ref 11.5–14.5)
WBC: 4.9 10*3/uL (ref 3.6–11.0)

## 2016-01-22 LAB — FERRITIN: Ferritin: 15 ng/mL (ref 11–307)

## 2016-01-22 NOTE — Progress Notes (Signed)
Pt reports fatigue that she pushes through to get through and do activities.  Pt reports constipation and that she is still taking her Iron.  Pt reported that at times she goes to have a BM and doesn't know she did.  Followed GI about 2 years ago.  Pt reports headaches have been better.

## 2016-01-22 NOTE — Progress Notes (Signed)
[facility] day:  01/22/2016   Chief Complaint: Leslie Olsen is an 65 y.o. female with iron deficiency anemia who seen for 2 month assessment.  HPI: The patient was last seen in the medical oncology clinic on 11/19/2015.  At that time, she was seen for assessment after completion of IV iron.  Popcorn and ice pica had resolved.  Hematocrit had improved from 30.6 to 35.9.  Ferritin had improved from 7 to 73.  At last visit, she noted shortness of breath with minimal activity.  Symptoms had been going on for "a long time".  She noted COPD.  She hadn't seen a pulmonologist in over a year.  Last chest CT on 08/13/2014 had revealed mild centrilobar emphysema, right minor fissure thickening, 5 mm RLL nodule, and 5 mm LLL subpleural nodule.  Patient was referred to pulmonary medicine.  CXR on 11/27/2015 revealed no active cardiopulmonary disease.  She was seen by Dr. Stevenson Clinch on 01/07/2016.  She was felt to have mild stable COPD.  She states that she was put on a new inhaler.  Evaluation by cardiology revealed a normal nuclear stress test and echocardiogram.  She was referred to pulmonary rehabilitation.  Outside records were obtained.  On 06/05/2014, she underwent EGD and colonoscopy by Dr. Lucilla Lame.  EGD revealed a hiatal hernia and gastritis.  Colonoscopy noted revealed a poor prep.  There was a 7 mm polyp in the transverse colon and non-bleeding internal hemorrhoids.  Symptomatically, she feels better.  She describes being less fatigued.  She feels that she "can't escape fatigue".  She is back using her CPAP.  She is taking 1 iron pill a day.  She states that 3 months ago her thyroid medication was adjusted (TSH was 0.385).  She also comments that she saw the neurologist last week "to keep the door open".   Past Medical History  Diagnosis Date  . COPD (chronic obstructive pulmonary disease) ([place])   . GERD (gastroesophageal reflux disease)   .  Hypothyroidism   . Anemia   . Chronic headaches   . Hypertension   . Diabetes mellitus without complication [facility])     Past Surgical History  Procedure Laterality Date  . Abdominal hysterectomy    . Back surgery    . Shoulder surgery    . Coronary stent placement    . Tibia fracture surgery    . Replacement total knee    . Cholecystectomy    . Reduction mammaplasty      Family History  Problem Relation Age of Onset  . Lung cancer Maternal Aunt   . Breast cancer Maternal Aunt 57  . Other Mother     Killed in car accident, 52  . Other Father     Killed in car accident, 2  . Thyroid disease Brother   . Thyroid disease Sister     Social History:  reports that she quit smoking about 4 years ago. Her smoking use included Cigarettes. She has a 94 pack-year smoking history. She has never used smokeless tobacco. She reports that she does not drink alcohol or use illicit drugs.  She is alone today.  Allergies: No Known Allergies  Current Medications: Current Outpatient Prescriptions  Medication Sig Dispense Refill  . albuterol (PROVENTIL HFA;VENTOLIN HFA) 108 (90 Base) MCG/ACT inhaler Inhale 2 puffs into the lungs every 6 (six) hours as needed for wheezing. 6.7 g 5  . aspirin 81 MG tablet Take 81 mg by  mouth daily.    . budesonide-formoterol (SYMBICORT) 160-4.5 MCG/ACT inhaler Inhale 1 puff into the lungs 2 (two) times daily. 1 Inhaler 5  . ferrous fumarate (HEMOCYTE - 106 MG FE) 325 (106 FE) MG TABS tablet Take 1 tablet by mouth daily.    Marland Kitchen gabapentin (NEURONTIN) 300 MG capsule Take 1 capsule (300 mg total) by mouth 3 (three) times daily. 90 capsule 3  . glucose blood (COOL BLOOD GLUCOSE TEST STRIPS) test strip Use as instructed 100 each 12  . levothyroxine (SYNTHROID, LEVOTHROID) 75 MCG tablet Take 1 tablet (75 mcg total) by mouth daily. Brand name required, come back in 6 weeks for lab recheck 30 tablet 1  . lisinopril (PRINIVIL,ZESTRIL) 5 MG tablet Take 1 tablet (5 mg total) by  mouth daily. 90 tablet 1  . metFORMIN (GLUCOPHAGE) 500 MG tablet Take 1 tablet (500 mg total) by mouth 2 (two) times daily with a meal. 360 tablet 1  . nortriptyline (PAMELOR) 25 MG capsule     . Omega-3 Fatty Acids (FISH OIL) 1200 MG CAPS Take 1 capsule by mouth daily.    . Red Yeast Rice 600 MG CAPS Take 2 capsules by mouth daily.    . SUMAtriptan (IMITREX) 100 MG tablet Take 100 mg by mouth every 2 (two) hours as needed for migraine. May repeat in 2 hours if headache persists or recurs.     No current facility-administered medications for this visit.    Review of Systems:  GENERAL:  Feels better, but still has fatigue.  No fevers or sweats.  Weight up 3 pounds. PERFORMANCE STATUS (ECOG):  2 HEENT:  No visual changes, runny nose, sore throat, mouth sores or tenderness. Lungs:  COPD.  Chronic shortness of breath.  Rare cough.  No pleuritic chest pain.  No hemoptysis.  Back on CPAP. Cardiac:  No chest pain, palpitations, or PND.  Two pillow orthopnea. GI:  No nausea, vomiting, diarrhea, constipation, melena or hematochezia. GU:  No urgency, frequency, dysuria, or hematuria. Musculoskeletal:  No back pain.  No joint pain.  No muscle tenderness. Extremities:  No pain or swelling. Skin:  No rashes or skin changes. Neuro:  Headaches, improved. No numbness or weakness, balance or coordination issues. Endocrine:  Hypothyroid on Synthroid.  Diabetes.  No hot flashes or night sweats. Psych:  No mood changes, depression or anxiety. Pain:  No focal pain. Review of systems:  All other systems reviewed and found to be negative.   Physical Exam: Blood pressure 137/81, pulse 86, temperature 97.2 F (36.2 C), temperature source Tympanic, resp. rate 18, height 5\' 6"  (1.676 m), weight 202 lb 2.6 oz (91.7 kg).  GENERAL:  Well developed, well nourished,  woman sitting comfortably in the exam room in no acute distress. MENTAL STATUS:  Alert and oriented to person, place and time. HEAD:  Short gray hair.   Normocephalic, atraumatic, face symmetric, no Cushingoid features. EYES:  Blue eyes.  Pupils equal round and reactive to light and accomodation.  No conjunctivitis or scleral icterus. ENT:  Oropharynx clear without lesion.  Partial.  Tongue normal.  Mucous membranes moist.  RESPIRATORY:  Clear to auscultation without rales, wheezes or rhonchi.  CARDIOVASCULAR:  Regular rate and rhythm without murmur, rub or gallop. ABDOMEN:  Soft, non-tender, with active bowel sounds, and no appreciable hepatosplenomegaly.  No masses. SKIN:  No rashes, ulcers or lesions. EXTREMITIES: No edema, no skin discoloration or tenderness.  No palpable cords. LYMPH NODES: No palpable cervical, supraclavicular, axillary or inguinal adenopathy  NEUROLOGICAL: Unremarkable. PSYCH:  Appropriate.   Appointment on 01/22/2016  Component Date Value Ref Range Status  . WBC 01/22/2016 4.9  3.6 - 11.0 K/uL Final  . RBC 01/22/2016 4.34  3.80 - 5.20 MIL/uL Final  . Hemoglobin 01/22/2016 12.3  12.0 - 16.0 g/dL Final  . HCT 01/22/2016 37.3  35.0 - 47.0 % Final  . MCV 01/22/2016 85.9  80.0 - 100.0 fL Final  . MCH 01/22/2016 28.3  26.0 - 34.0 pg Final  . MCHC 01/22/2016 32.9  32.0 - 36.0 g/dL Final  . RDW 01/22/2016 19.6* 11.5 - 14.5 % Final  . Platelets 01/22/2016 256  150 - 440 K/uL Final  . Neutrophils Relative % 01/22/2016 53   Final  . Neutro Abs 01/22/2016 2.5  1.4 - 6.5 K/uL Final  . Lymphocytes Relative 01/22/2016 35   Final  . Lymphs Abs 01/22/2016 1.7  1.0 - 3.6 K/uL Final  . Monocytes Relative 01/22/2016 7   Final  . Monocytes Absolute 01/22/2016 0.4  0.2 - 0.9 K/uL Final  . Eosinophils Relative 01/22/2016 4   Final  . Eosinophils Absolute 01/22/2016 0.2  0 - 0.7 K/uL Final  . Basophils Relative 01/22/2016 1   Final  . Basophils Absolute 01/22/2016 0.1  0 - 0.1 K/uL Final    Assessment:  Leslie Olsen is an 65 y.o. female with a history of iron deficiency anemia.  EGD on 06/05/2014 revealed a hiatal hernia and  gastritis.  Colonoscopy on 06/05/2014 noted revealed a poor prep.  There was a 7 mm polyp in the transverse colon and non-bleeding internal hemorrhoids.  Her diet is good.  She denies any melena or hematochezia.  She denies any uterine bleeding.    She has a 90 pack year smoking history.  She has COPD.  Chest CT on 08/23/2013 revealed a questionable lesion at the pleura.  Chest CT on 08/23/2014 suggested benign etiology with similar scattered pulmonary nodules, unchanged ground glass opacity posterior right upper lobe, subtle subpleural reticulation, and similar low density enlargement of both adrenals.  CXR on 11/27/2015 revealed no active cardiopulmonary disease.  She has mild stable COPD per Dr. Stevenson Clinch on 01/07/2016.  Cardiology evaluation revealed a normal exercise nuclear stress test on 12/18/2015 and echocardiogram on 11/2015 (EF 60-65%).  She has sleep apnea and is using her CPAP again.  She was on oral iron x 2 years.  She developed recurrent iron deficiency anemia.  She denies any melena or hematochezia.  She received Venofer 600 mg (10/29/2015 - 11/12/2015).  Popcorn and ice pica have resolved.  Hematocrit has improved from 30.6 to 37.3.  Ferritin improved from 7 to 73 on 11/19/2015.  Symptomatically, she notes persistent fatigue and chronic shortness of breath.  Exam is stable.  Hematocrit and MCV are normal.  Ferritin is pending.  Plan: 1.  Labs today:  CBC with diff, ferritin. 2.  Review results from pulmonary and cardiac evaluation. 3.  Review EGD and colonoscopy from 06/05/2014. 4.  Call patient with ferritin.  If ferritin < 30, will schedule additional IV iron. 5.  If iron deficiency anemia recurs, will follow-up with Dr. Allen Norris regarding capsule enteroscopy. 6.  RTC in 3 months for MD assessment and labs (CBC with diff, ferritin) +/- Venofer.  Addendum:  Ferritin returned 15 (low).  Patient called to schedule Venofer 200 mg IV x 2 (05/24 and 02/05/2016).   Lequita Asal, MD   01/22/2016 , 10:44 AM

## 2016-01-23 ENCOUNTER — Telehealth: Payer: Self-pay | Admitting: *Deleted

## 2016-01-23 ENCOUNTER — Other Ambulatory Visit: Payer: Self-pay | Admitting: *Deleted

## 2016-01-23 DIAGNOSIS — D509 Iron deficiency anemia, unspecified: Secondary | ICD-10-CM

## 2016-01-23 NOTE — Telephone Encounter (Signed)
-----   Message from Lequita Asal, MD sent at 01/22/2016  7:33 PM EDT ----- Regarding: Ferritin low  IV iron needed.  M   ----- Message -----    From: Lab In [place]: 01/22/2016  10:24 AM      To: Lequita Asal, MD

## 2016-01-23 NOTE — Telephone Encounter (Signed)
Called pt and she is agreeable to venofer 200 mg iv x 2 weekly. I have sent message to sch. To call her and schedule 1 dose next week and 2nd dose the week after.  I have already entered the orders for the venofer to be given

## 2016-01-29 ENCOUNTER — Inpatient Hospital Stay: Payer: PPO

## 2016-01-29 VITALS — BP 129/74 | HR 103 | Temp 97.5°F | Resp 18

## 2016-01-29 DIAGNOSIS — D649 Anemia, unspecified: Secondary | ICD-10-CM

## 2016-01-29 DIAGNOSIS — D509 Iron deficiency anemia, unspecified: Secondary | ICD-10-CM | POA: Diagnosis not present

## 2016-01-29 MED ORDER — SODIUM CHLORIDE 0.9 % IV SOLN
Freq: Once | INTRAVENOUS | Status: AC
  Administered 2016-01-29: 15:00:00 via INTRAVENOUS
  Filled 2016-01-29: qty 1000

## 2016-01-29 MED ORDER — SODIUM CHLORIDE 0.9 % IV SOLN
200.0000 mg | Freq: Once | INTRAVENOUS | Status: AC
  Administered 2016-01-29: 200 mg via INTRAVENOUS
  Filled 2016-01-29: qty 10

## 2016-02-02 ENCOUNTER — Encounter: Payer: Self-pay | Admitting: Hematology and Oncology

## 2016-02-02 ENCOUNTER — Other Ambulatory Visit: Payer: Self-pay | Admitting: Hematology and Oncology

## 2016-02-05 ENCOUNTER — Inpatient Hospital Stay: Payer: PPO

## 2016-02-05 VITALS — BP 125/74 | HR 105 | Temp 97.3°F | Resp 20

## 2016-02-05 DIAGNOSIS — D509 Iron deficiency anemia, unspecified: Secondary | ICD-10-CM

## 2016-02-05 DIAGNOSIS — D649 Anemia, unspecified: Secondary | ICD-10-CM

## 2016-02-05 MED ORDER — SODIUM CHLORIDE 0.9 % IV SOLN
Freq: Once | INTRAVENOUS | Status: AC
  Administered 2016-02-05: 15:00:00 via INTRAVENOUS
  Filled 2016-02-05: qty 1000

## 2016-02-05 MED ORDER — SODIUM CHLORIDE 0.9 % IV SOLN
200.0000 mg | Freq: Once | INTRAVENOUS | Status: AC
  Administered 2016-02-05: 200 mg via INTRAVENOUS
  Filled 2016-02-05: qty 10

## 2016-02-06 ENCOUNTER — Telehealth: Payer: Self-pay | Admitting: *Deleted

## 2016-02-06 NOTE — Telephone Encounter (Signed)
Called pt and got her voice mail of mobile phone. She had told Lattie Haw as well as told Rosa in chemo that she feels that she should not wait til aug. She has sob with min, exertion.  According to dr Mike Gip on her note about pt from 5/17. Pt also has the c/o sob and it is not new and she was sent back to her pulm. For her COPD and on xray-emphysema.  Her hgb was normal and her iron levels were low and pt was set up for venofer x 2 the last one being today when she came to clinic.  I advised her on the message that she should contact her pulmonary md and that I would ask corcoran about moving her appt up on Monday and get back with the pt then due to [place] on vacation.

## 2016-02-06 NOTE — Telephone Encounter (Signed)
-----   Message from Cephus Richer sent at 02/05/2016  4:14 PM EDT ----- Contact: [phone] Per pt want someone to call her. She was in office today and has to return in 3 months. Pt states that is to long and she want to return before aug. She's very short of breath. Please  Call .

## 2016-02-09 NOTE — Telephone Encounter (Signed)
  Her anemia is not causing her shortness of breath.  I would recommend following up with her PCP, pulmonologist, or the emergency room.  If her shortness of breath has worsened, I would have her go to the ER for urgent care.  M

## 2016-02-10 ENCOUNTER — Telehealth: Payer: Self-pay | Admitting: *Deleted

## 2016-02-10 DIAGNOSIS — D509 Iron deficiency anemia, unspecified: Secondary | ICD-10-CM

## 2016-02-10 NOTE — Telephone Encounter (Signed)
Pt had called last week to see if she needed to come back sooner than aug. Since her ferritin was low and she was ordered 2 doses of venofer and her last dose 5/31 per corcoran just recheck her levels in month from last dose which will be 6/28 cbc, ferritin pt agreeable to 9 am on that date and in basket sent to sch. To let her know.

## 2016-02-11 ENCOUNTER — Other Ambulatory Visit: Payer: Self-pay | Admitting: *Deleted

## 2016-02-11 DIAGNOSIS — D509 Iron deficiency anemia, unspecified: Secondary | ICD-10-CM

## 2016-02-21 ENCOUNTER — Encounter: Payer: Self-pay | Admitting: Family Medicine

## 2016-02-21 ENCOUNTER — Ambulatory Visit (INDEPENDENT_AMBULATORY_CARE_PROVIDER_SITE_OTHER): Payer: PPO | Admitting: Family Medicine

## 2016-02-21 VITALS — BP 119/79 | HR 92 | Temp 98.4°F | Wt 200.0 lb

## 2016-02-21 DIAGNOSIS — E785 Hyperlipidemia, unspecified: Secondary | ICD-10-CM | POA: Diagnosis not present

## 2016-02-21 DIAGNOSIS — R499 Unspecified voice and resonance disorder: Secondary | ICD-10-CM | POA: Diagnosis not present

## 2016-02-21 DIAGNOSIS — E119 Type 2 diabetes mellitus without complications: Secondary | ICD-10-CM

## 2016-02-21 DIAGNOSIS — R490 Dysphonia: Secondary | ICD-10-CM

## 2016-02-21 DIAGNOSIS — Z23 Encounter for immunization: Secondary | ICD-10-CM | POA: Diagnosis not present

## 2016-02-21 DIAGNOSIS — I10 Essential (primary) hypertension: Secondary | ICD-10-CM | POA: Diagnosis not present

## 2016-02-21 DIAGNOSIS — M5481 Occipital neuralgia: Secondary | ICD-10-CM

## 2016-02-21 DIAGNOSIS — E039 Hypothyroidism, unspecified: Secondary | ICD-10-CM

## 2016-02-21 DIAGNOSIS — D509 Iron deficiency anemia, unspecified: Secondary | ICD-10-CM

## 2016-02-21 LAB — LP+ALT+AST PICCOLO, WAIVED
ALT (SGPT) Piccolo, Waived: 21 U/L (ref 10–47)
AST (SGOT) PICCOLO, WAIVED: 26 U/L (ref 11–38)
CHOL/HDL RATIO PICCOLO,WAIVE: 4.5 mg/dL
CHOLESTEROL PICCOLO, WAIVED: 253 mg/dL — AB (ref <200)
HDL CHOL PICCOLO, WAIVED: 56 mg/dL — AB (ref >59)
LDL CHOL CALC PICCOLO WAIVED: 174 mg/dL — AB (ref <100)
Triglycerides Piccolo,Waived: 113 mg/dL (ref <150)
VLDL Chol Calc Piccolo,Waive: 23 mg/dL (ref <30)

## 2016-02-21 LAB — BAYER DCA HB A1C WAIVED: HB A1C (BAYER DCA - WAIVED): 7.1 % — ABNORMAL HIGH (ref <7.0)

## 2016-02-21 MED ORDER — ROSUVASTATIN CALCIUM 20 MG PO TABS: 20.0000 mg | ORAL_TABLET | Freq: Every day | ORAL | Status: DC

## 2016-02-21 MED ORDER — GABAPENTIN 300 MG PO CAPS: 300.0000 mg | ORAL_CAPSULE | Freq: Three times a day (TID) | ORAL | Status: DC

## 2016-02-21 MED ORDER — LISINOPRIL 5 MG PO TABS: 5.0000 mg | ORAL_TABLET | Freq: Every day | ORAL | Status: DC

## 2016-02-21 MED ORDER — METFORMIN HCL 500 MG PO TABS: 500.0000 mg | ORAL_TABLET | Freq: Two times a day (BID) | ORAL | Status: DC

## 2016-02-21 MED ORDER — ALBUTEROL SULFATE HFA 108 (90 BASE) MCG/ACT IN AERS: 2.0000 | INHALATION_SPRAY | Freq: Four times a day (QID) | RESPIRATORY_TRACT | Status: DC | PRN

## 2016-02-21 MED ORDER — NORTRIPTYLINE HCL 25 MG PO CAPS: 25.0000 mg | ORAL_CAPSULE | Freq: Every day | ORAL | Status: DC

## 2016-02-21 MED ORDER — CLONAZEPAM 0.5 MG PO TABS: 0.5000 mg | ORAL_TABLET | Freq: Two times a day (BID) | ORAL | Status: DC | PRN

## 2016-02-21 NOTE — Assessment & Plan Note (Signed)
Rechecking levels today. Await results. Adjust as needed.

## 2016-02-21 NOTE — Assessment & Plan Note (Signed)
Not under good control. Will start her on crestor. Will watch closely for side effects. Check back in in 1 month. Call with concerns.

## 2016-02-21 NOTE — Assessment & Plan Note (Signed)
Continue to follow with neurology. Continue current regimen. Refills given today. Call with any concerns.

## 2016-02-21 NOTE — Assessment & Plan Note (Signed)
Getting significantly worse. Now with audible stridor and accessory muscle use to get sounds out. ?LPR, vocal cord dysfunction, spasmotic dysphonia. Will start her on some klonopin x 1 month to see if that relieves symptoms. Will get her into see ENT for evaluation. Await appointment.

## 2016-02-21 NOTE — Assessment & Plan Note (Signed)
Continue to follow with hematology. Continue IV iron. Call with any concerns.

## 2016-02-21 NOTE — Progress Notes (Signed)
BP 119/79 mmHg  Pulse 92  Temp(Src) 98.4 F (36.9 C)  Wt 200 lb (90.719 kg)  SpO2 96%   Subjective:    Patient ID: Leslie Olsen, female    DOB: 1951/06/26, 65 y.o.   MRN: [id number]  HPI: Leslie Olsen is a 65 y.o. female  Chief Complaint  Patient presents with  . Hypothyroidism  . Headache    patient needs a refill on gabapentin, patient states that the headaches get severe when she is really anemic   DIABETES- has not been paying much attention to her sugars because she has not been feeling well and she doesn't eat well when she is not feeling well Hypoglycemic episodes:no Polydipsia/polyuria: no Visual disturbance: no Chest pain: no Paresthesias: no Glucose Monitoring: no Taking Insulin?: no Blood Pressure Monitoring: not checking Retinal Examination: Up to Date Foot Exam: Up to Date Diabetic Education: Completed Pneumovax: Up to Date Influenza: Up to Date Aspirin: no  HYPERTENSION / HYPERLIPIDEMIA Satisfied with current treatment? yes Duration of hypertension: chronic BP monitoring frequency: not checking BP medication side effects: no Duration of hyperlipidemia: chronic Cholesterol medication side effects: yes- had a problem with simvastatin and lipitor in the past with muscle aches Cholesterol supplements: none Past cholesterol medications: simvastatin and lipitor Medication compliance: good compliance Aspirin: no Recent stressors: yes Recurrent headaches: yes Visual changes: no Palpitations: no Dyspnea: no Chest pain: no Lower extremity edema: no Dizzy/lightheaded: no  HYPOTHYROIDISM Thyroid control status:better Satisfied with current treatment? yes Medication side effects: no Medication compliance: excellent compliance Etiology of hypothyroidism:  Recent dose adjustment:yes Fatigue: yes Cold intolerance: no Heat intolerance: no  Weight gain: no Weight loss: no Constipation: no Diarrhea/loose stools: no Palpitations: yes Lower  extremity edema: no Anxiety/depressed mood: no  Headaches- following with Dr. Melrose Nakayama. On Gabapentin, nortriptyline and imitrex as needed  Has been having her iron rechecked at hematology. Feels better when her iron level is higher. Notes that she is not feeling well now, likely going to need additional infusions.   Notes that her voice has changed significantly and seems to be getting worse. She states that she feels like she is short of breath because her throat is tight and she can't get the air in. Has seen both pulmonology and cardiology and they do not see a reason for her level of shortness of breath. She notes that sometimes it is very hard for her to get her voice out. She notes that it upsets her brother when she calls him on the phone because she doesn't sound like herself. She notes that they have been getting her to take nexium and she doesn't feel like she is having heartburn. She has a spot on the L side of her neck that seems to buldge on days that her voice is doing worse. She is very frustrated with this and doesn't know what to do.    Relevant past medical, surgical, family and social history reviewed and updated as indicated. Interim medical history since our last visit reviewed. Allergies and medications reviewed and updated.  Review of Systems  Constitutional: Positive for fatigue. Negative for fever, chills, diaphoresis, activity change, appetite change and unexpected weight change.  HENT: Positive for sore throat and voice change. Negative for congestion, dental problem, drooling, ear discharge, ear pain, facial swelling, hearing loss, mouth sores, nosebleeds, postnasal drip, rhinorrhea, sinus pressure, sneezing, tinnitus and trouble swallowing.   Respiratory: Positive for shortness of breath and stridor. Negative for apnea, cough, choking, chest tightness and wheezing.  Cardiovascular: Negative.   Psychiatric/Behavioral: Negative.     Per HPI unless specifically  indicated above     Objective:    BP 119/79 mmHg  Pulse 92  Temp(Src) 98.4 F (36.9 C)  Wt 200 lb (90.719 kg)  SpO2 96%  Wt Readings from Last 3 Encounters:  02/21/16 200 lb (90.719 kg)  01/22/16 202 lb 2.6 oz (91.7 kg)  01/07/16 200 lb (90.719 kg)    Physical Exam  Constitutional: She is oriented to person, place, and time. She appears well-developed and well-nourished. No distress.  HENT:  Head: Normocephalic and atraumatic.  Right Ear: Hearing and external ear normal.  Left Ear: Hearing and external ear normal.  Nose: Nose normal.  Mouth/Throat: Oropharynx is clear and moist. No oropharyngeal exudate.  Eyes: Conjunctivae, EOM and lids are normal. Pupils are equal, round, and reactive to light. Right eye exhibits no discharge. Left eye exhibits no discharge. No scleral icterus.  Neck: Normal range of motion. Neck supple. No JVD present. No tracheal deviation present. No thyromegaly present.  Hoarse voice, increased use of accessory muscles, straining to get voice out  Cardiovascular: Normal rate, regular rhythm, normal heart sounds and intact distal pulses.  Exam reveals no gallop and no friction rub.   No murmur heard. Pulmonary/Chest: Effort normal and breath sounds normal. Stridor present. No respiratory distress. She has no wheezes. She has no rales. She exhibits no tenderness.  Musculoskeletal: Normal range of motion.  Lymphadenopathy:    She has no cervical adenopathy.  Neurological: She is alert and oriented to person, place, and time.  Skin: Skin is warm, dry and intact. No rash noted. She is not diaphoretic. No erythema. No pallor.  Psychiatric: She has a normal mood and affect. Her speech is normal and behavior is normal. Judgment and thought content normal. Cognition and memory are normal.  Nursing note and vitals reviewed.   Results for orders placed or performed in visit on 02/21/16  Bayer DCA Hb A1c Waived  Result Value Ref Range   Bayer DCA Hb A1c Waived 7.1  (H) <7.0 %  LP+ALT+AST Piccolo, Waived  Result Value Ref Range   ALT (SGPT) Piccolo, Waived 21 10 - 47 U/L   AST (SGOT) Piccolo, Waived 26 11 - 38 U/L   Cholesterol Piccolo, Waived 253 (H) <200 mg/dL   HDL Chol Piccolo, Waived 56 (L) >59 mg/dL   Triglycerides Piccolo,Waived 113 <150 mg/dL   Chol/HDL Ratio Piccolo,Waive 4.5 mg/dL   LDL Chol Calc Piccolo Waived 174 (H) <100 mg/dL   VLDL Chol Calc Piccolo,Waive 23 <30 mg/dL      Assessment & Plan:   Problem List Items Addressed This Visit      Cardiovascular and Mediastinum   Essential hypertension    Under good control. Continue current regimen. Continue to monitor. Call with any concerns.       Relevant Medications   rosuvastatin (CRESTOR) 20 MG tablet   lisinopril (PRINIVIL,ZESTRIL) 5 MG tablet   Other Relevant Orders   CBC with Differential/Platelet     Endocrine   Diabetes mellitus without complication (HCC)    Stable on current regimen. Continue current regimen. Continue to monitor.       Relevant Medications   rosuvastatin (CRESTOR) 20 MG tablet   lisinopril (PRINIVIL,ZESTRIL) 5 MG tablet   metFORMIN (GLUCOPHAGE) 500 MG tablet   Other Relevant Orders   Bayer DCA Hb A1c Waived (Completed)   CBC with Differential/Platelet   Comprehensive metabolic panel   LP+ALT+AST Piccolo, Waived (  Completed)   Thyroid activity decreased - Primary    Rechecking levels today. Await results. Adjust as needed.       Relevant Orders   CBC with Differential/Platelet   Comprehensive metabolic panel   TSH     Other   Hoarseness or changing voice    Getting significantly worse. Now with audible stridor and accessory muscle use to get sounds out. ?LPR, vocal cord dysfunction, spasmotic dysphonia. Will start her on some klonopin x 1 month to see if that relieves symptoms. Will get her into see ENT for evaluation. Await appointment.       Anemia    Continue to follow with hematology. Continue IV iron. Call with any concerns.        Hyperlipidemia    Not under good control. Will start her on crestor. Will watch closely for side effects. Check back in in 1 month. Call with concerns.       Relevant Medications   rosuvastatin (CRESTOR) 20 MG tablet   lisinopril (PRINIVIL,ZESTRIL) 5 MG tablet   Other Relevant Orders   CBC with Differential/Platelet   LP+ALT+AST Piccolo, Waived (Completed)   Occipital neuralgia    Continue to follow with neurology. Continue current regimen. Refills given today. Call with any concerns.        Other Visit Diagnoses    Immunization due        Pneumovax given today.    Relevant Orders    Pneumococcal polysaccharide vaccine 23-valent greater than or equal to 2yo subcutaneous/IM (Completed)    CBC with Differential/Platelet    Hoarseness        Relevant Orders    Ambulatory referral to ENT        Follow up plan: Return in about 4 weeks (around 03/20/2016) for follow up voice and cholesterol.

## 2016-02-21 NOTE — Assessment & Plan Note (Signed)
Under good control. Continue current regimen. Continue to monitor. Call with any concerns. 

## 2016-02-21 NOTE — Assessment & Plan Note (Signed)
Stable on current regimen. Continue current regimen. Continue to monitor.  

## 2016-02-22 LAB — CBC WITH DIFFERENTIAL/PLATELET
BASOS ABS: 0.1 10*3/uL (ref 0.0–0.2)
Basos: 2 %
EOS (ABSOLUTE): 0.2 10*3/uL (ref 0.0–0.4)
Eos: 5 %
HEMOGLOBIN: 12.7 g/dL (ref 11.1–15.9)
Hematocrit: 37.9 % (ref 34.0–46.6)
IMMATURE GRANS (ABS): 0 10*3/uL (ref 0.0–0.1)
IMMATURE GRANULOCYTES: 0 %
LYMPHS: 32 %
Lymphocytes Absolute: 1.5 10*3/uL (ref 0.7–3.1)
MCH: 29.9 pg (ref 26.6–33.0)
MCHC: 33.5 g/dL (ref 31.5–35.7)
MCV: 89 fL (ref 79–97)
MONOCYTES: 9 %
Monocytes Absolute: 0.4 10*3/uL (ref 0.1–0.9)
Neutrophils Absolute: 2.4 10*3/uL (ref 1.4–7.0)
Neutrophils: 52 %
PLATELETS: 281 10*3/uL (ref 150–379)
RBC: 4.25 x10E6/uL (ref 3.77–5.28)
RDW: 16.1 % — ABNORMAL HIGH (ref 12.3–15.4)
WBC: 4.6 10*3/uL (ref 3.4–10.8)

## 2016-02-22 LAB — COMPREHENSIVE METABOLIC PANEL
ALT: 15 IU/L (ref 0–32)
AST: 18 IU/L (ref 0–40)
Albumin/Globulin Ratio: 1.5 (ref 1.2–2.2)
Albumin: 4.6 g/dL (ref 3.6–4.8)
Alkaline Phosphatase: 100 IU/L (ref 39–117)
BILIRUBIN TOTAL: 0.4 mg/dL (ref 0.0–1.2)
BUN/Creatinine Ratio: 26 (ref 12–28)
BUN: 22 mg/dL (ref 8–27)
CALCIUM: 9.7 mg/dL (ref 8.7–10.3)
CHLORIDE: 98 mmol/L (ref 96–106)
CO2: 21 mmol/L (ref 18–29)
CREATININE: 0.85 mg/dL (ref 0.57–1.00)
GFR, EST AFRICAN AMERICAN: 83 mL/min/{1.73_m2} (ref >59)
GFR, EST NON AFRICAN AMERICAN: 72 mL/min/{1.73_m2} (ref >59)
GLUCOSE: 134 mg/dL — AB (ref 65–99)
Globulin, Total: 3 g/dL (ref 1.5–4.5)
Potassium: 4.9 mmol/L (ref 3.5–5.2)
Sodium: 137 mmol/L (ref 134–144)
TOTAL PROTEIN: 7.6 g/dL (ref 6.0–8.5)

## 2016-02-22 LAB — THYROID PANEL WITH TSH
Free Thyroxine Index: 3.1 (ref 1.2–4.9)
T3 UPTAKE RATIO: 28 % (ref 24–39)
T4 TOTAL: 11.1 ug/dL (ref 4.5–12.0)
TSH: 0.708 u[IU]/mL (ref 0.450–4.500)

## 2016-02-24 ENCOUNTER — Telehealth: Payer: Self-pay | Admitting: Family Medicine

## 2016-02-24 LAB — HEMOGLOBIN A1C: Hemoglobin A1C: 7.1

## 2016-02-24 MED ORDER — LEVOTHYROXINE SODIUM 75 MCG PO TABS: 75.0000 ug | ORAL_TABLET | Freq: Every day | ORAL | Status: DC

## 2016-02-24 NOTE — Telephone Encounter (Signed)
Pleas let her know that her labs came back normal and I sent a year supply of her thyroid to the pharmacy. Thanks!

## 2016-02-24 NOTE — Telephone Encounter (Signed)
Patient notified

## 2016-03-04 ENCOUNTER — Inpatient Hospital Stay: Payer: PPO | Attending: Hematology and Oncology

## 2016-03-04 DIAGNOSIS — D509 Iron deficiency anemia, unspecified: Secondary | ICD-10-CM | POA: Diagnosis not present

## 2016-03-04 LAB — CBC WITH DIFFERENTIAL/PLATELET
Basophils Absolute: 0.1 10*3/uL (ref 0–0.1)
Basophils Relative: 1 %
Eosinophils Absolute: 0.2 10*3/uL (ref 0–0.7)
Eosinophils Relative: 4 %
HCT: 36.2 % (ref 35.0–47.0)
Hemoglobin: 12.4 g/dL (ref 12.0–16.0)
Lymphocytes Relative: 34 %
Lymphs Abs: 2.1 10*3/uL (ref 1.0–3.6)
MCH: 30.9 pg (ref 26.0–34.0)
MCHC: 34.4 g/dL (ref 32.0–36.0)
MCV: 89.8 fL (ref 80.0–100.0)
Monocytes Absolute: 0.5 10*3/uL (ref 0.2–0.9)
Monocytes Relative: 8 %
Neutro Abs: 3.3 10*3/uL (ref 1.4–6.5)
Neutrophils Relative %: 53 %
Platelets: 288 10*3/uL (ref 150–440)
RBC: 4.03 MIL/uL (ref 3.80–5.20)
RDW: 14.4 % (ref 11.5–14.5)
WBC: 6.3 10*3/uL (ref 3.6–11.0)

## 2016-03-04 LAB — FERRITIN: Ferritin: 47 ng/mL (ref 11–307)

## 2016-03-05 ENCOUNTER — Telehealth: Payer: Self-pay

## 2016-03-05 NOTE — Telephone Encounter (Signed)
Returned pt's phone call regarding lab results.  I told pt her ferritin and cbc numbers.  Pt verbalized gratitude that she just wanted to know if her ferritin came up since her infusions.  No other concerns noted.  Told pt to call back if she had any concerns prior to her August appt.

## 2016-03-06 DIAGNOSIS — R490 Dysphonia: Secondary | ICD-10-CM | POA: Diagnosis not present

## 2016-03-06 DIAGNOSIS — Z87891 Personal history of nicotine dependence: Secondary | ICD-10-CM | POA: Diagnosis not present

## 2016-03-06 DIAGNOSIS — K219 Gastro-esophageal reflux disease without esophagitis: Secondary | ICD-10-CM | POA: Diagnosis not present

## 2016-03-09 ENCOUNTER — Other Ambulatory Visit: Payer: Self-pay | Admitting: Nurse Practitioner

## 2016-03-20 ENCOUNTER — Encounter: Payer: Self-pay | Admitting: Family Medicine

## 2016-03-20 ENCOUNTER — Ambulatory Visit (INDEPENDENT_AMBULATORY_CARE_PROVIDER_SITE_OTHER): Payer: PPO | Admitting: Family Medicine

## 2016-03-20 ENCOUNTER — Other Ambulatory Visit: Payer: Self-pay | Admitting: Family Medicine

## 2016-03-20 VITALS — BP 130/76 | HR 80 | Temp 97.7°F | Ht 66.0 in | Wt 202.0 lb

## 2016-03-20 DIAGNOSIS — E785 Hyperlipidemia, unspecified: Secondary | ICD-10-CM

## 2016-03-20 DIAGNOSIS — R499 Unspecified voice and resonance disorder: Secondary | ICD-10-CM

## 2016-03-20 LAB — LP+ALT+AST PICCOLO, WAIVED
ALT (SGPT) Piccolo, Waived: 22 U/L (ref 10–47)
AST (SGOT) Piccolo, Waived: 21 U/L (ref 11–38)
CHOLESTEROL PICCOLO, WAIVED: 259 mg/dL — AB (ref <200)
Chol/HDL Ratio Piccolo,Waive: 4.6 mg/dL
HDL CHOL PICCOLO, WAIVED: 57 mg/dL — AB (ref >59)
LDL Chol Calc Piccolo Waived: 161 mg/dL — ABNORMAL HIGH (ref <100)
Triglycerides Piccolo,Waived: 209 mg/dL — ABNORMAL HIGH (ref <150)
VLDL CHOL CALC PICCOLO,WAIVE: 42 mg/dL — AB (ref <30)

## 2016-03-20 MED ORDER — ROSUVASTATIN CALCIUM 40 MG PO TABS: 40.0000 mg | ORAL_TABLET | Freq: Every day | ORAL | Status: DC

## 2016-03-20 NOTE — Assessment & Plan Note (Signed)
Worse than last visit. Will increase to 40mg  of crestor and recheck in 1 month.

## 2016-03-20 NOTE — Progress Notes (Signed)
BP 130/76 mmHg  Pulse 80  Temp(Src) 97.7 F (36.5 C)  Ht 5\' 6"  (1.676 m)  Wt 202 lb (91.627 kg)  BMI 32.62 kg/m2  SpO2 98%   Subjective:    Patient ID: Leslie Olsen, female    DOB: [birth year], 65 y.o.   MRN: [id number]  HPI: Leslie Olsen is a 65 y.o. female  Chief Complaint  Patient presents with  . Hyperlipidemia  . Hoarse   Voice is the same. Feeling no better. Saw ENT. Did not get any better with the klonopin. Feeling no better. Doesn't want to deal with it any more.   HYPERLIPIDEMIA Hyperlipidemia status: uncontrolled- worse on current regimen Satisfied with current treatment?  yes Side effects:  no Medication compliance: excellent compliance Supplements: fish oil Aspirin:  yes Chest pain:  no Coronary artery disease:  yes Family history CAD:  yes  Relevant past medical, surgical, family and social history reviewed and updated as indicated. Interim medical history since our last visit reviewed. Allergies and medications reviewed and updated.  Review of Systems  Constitutional: Positive for fatigue. Negative for fever, chills, diaphoresis, activity change, appetite change and unexpected weight change.  HENT: Positive for voice change. Negative for congestion, dental problem, drooling, ear discharge, ear pain, facial swelling, hearing loss, mouth sores, nosebleeds, postnasal drip, rhinorrhea, sinus pressure, sneezing, sore throat, tinnitus and trouble swallowing.   Respiratory: Negative.   Cardiovascular: Negative.   Psychiatric/Behavioral: Negative.     Per HPI unless specifically indicated above     Objective:    BP 130/76 mmHg  Pulse 80  Temp(Src) 97.7 F (36.5 C)  Ht 5\' 6"  (1.676 m)  Wt 202 lb (91.627 kg)  BMI 32.62 kg/m2  SpO2 98%  Wt Readings from Last 3 Encounters:  03/20/16 202 lb (91.627 kg)  02/21/16 200 lb (90.719 kg)  01/22/16 202 lb 2.6 oz (91.7 kg)    Physical Exam  Constitutional: She is oriented to person, place, and time.  She appears well-developed and well-nourished. No distress.  HENT:  Head: Normocephalic and atraumatic.  Right Ear: Hearing normal.  Left Ear: Hearing normal.  Nose: Nose normal.  Hoarse voice- struggling to get words out  Eyes: Conjunctivae and lids are normal. Right eye exhibits no discharge. Left eye exhibits no discharge. No scleral icterus.  Cardiovascular: Normal rate, regular rhythm and intact distal pulses.  Exam reveals no gallop and no friction rub.   No murmur heard. Pulmonary/Chest: Effort normal. No respiratory distress. She has no wheezes. She has no rales.  Musculoskeletal: Normal range of motion.  Neurological: She is alert and oriented to person, place, and time.  Skin: Skin is warm, dry and intact. No rash noted. She is not diaphoretic. No erythema. No pallor.  Psychiatric: She has a normal mood and affect. Her speech is normal and behavior is normal. Judgment and thought content normal. Cognition and memory are normal.  Nursing note and vitals reviewed.   Results for orders placed or performed in visit on 03/04/16  Ferritin  Result Value Ref Range   Ferritin 47 11 - 307 ng/mL  CBC with Differential/Platelet  Result Value Ref Range   WBC 6.3 3.6 - 11.0 K/uL   RBC 4.03 3.80 - 5.20 MIL/uL   Hemoglobin 12.4 12.0 - 16.0 g/dL   HCT 36.2 35.0 - 47.0 %   MCV 89.8 80.0 - 100.0 fL   MCH 30.9 26.0 - 34.0 pg   MCHC 34.4 32.0 - 36.0 g/dL   RDW  14.4 11.5 - 14.5 %   Platelets 288 150 - 440 K/uL   Neutrophils Relative % 53 %   Neutro Abs 3.3 1.4 - 6.5 K/uL   Lymphocytes Relative 34 %   Lymphs Abs 2.1 1.0 - 3.6 K/uL   Monocytes Relative 8 %   Monocytes Absolute 0.5 0.2 - 0.9 K/uL   Eosinophils Relative 4 %   Eosinophils Absolute 0.2 0 - 0.7 K/uL   Basophils Relative 1 %   Basophils Absolute 0.1 0 - 0.1 K/uL      Assessment & Plan:   Problem List Items Addressed This Visit      Other   Hoarseness or changing voice    No better. No better on klonopin- will stop it.  Unsure of whether she would like to see GI just yet. Will wait and see hematology again, and if still anemic, will go and we will put in new referral to Dr. Allen Norris who she has seen previously. Does not want to do vocal rehab. Already on PPI. Continue to monitor.       Hyperlipidemia - Primary    Worse than last visit. Will increase to 40mg  of crestor and recheck in 1 month.       Relevant Medications   rosuvastatin (CRESTOR) 40 MG tablet       Follow up plan: Return in about 4 weeks (around 04/17/2016) for follow up on voice and HLD.

## 2016-03-20 NOTE — Assessment & Plan Note (Signed)
No better. No better on klonopin- will stop it. Unsure of whether she would like to see GI just yet. Will wait and see hematology again, and if still anemic, will go and we will put in new referral to Dr. Allen Norris who she has seen previously. Does not want to do vocal rehab. Already on PPI. Continue to monitor.

## 2016-04-16 ENCOUNTER — Other Ambulatory Visit: Payer: Self-pay | Admitting: *Deleted

## 2016-04-16 ENCOUNTER — Inpatient Hospital Stay: Payer: PPO | Attending: Hematology and Oncology

## 2016-04-16 DIAGNOSIS — E039 Hypothyroidism, unspecified: Secondary | ICD-10-CM | POA: Insufficient documentation

## 2016-04-16 DIAGNOSIS — K648 Other hemorrhoids: Secondary | ICD-10-CM | POA: Diagnosis not present

## 2016-04-16 DIAGNOSIS — I10 Essential (primary) hypertension: Secondary | ICD-10-CM | POA: Diagnosis not present

## 2016-04-16 DIAGNOSIS — K449 Diaphragmatic hernia without obstruction or gangrene: Secondary | ICD-10-CM | POA: Insufficient documentation

## 2016-04-16 DIAGNOSIS — D509 Iron deficiency anemia, unspecified: Secondary | ICD-10-CM | POA: Diagnosis not present

## 2016-04-16 DIAGNOSIS — D649 Anemia, unspecified: Secondary | ICD-10-CM | POA: Insufficient documentation

## 2016-04-16 DIAGNOSIS — R0602 Shortness of breath: Secondary | ICD-10-CM | POA: Diagnosis not present

## 2016-04-16 DIAGNOSIS — R5383 Other fatigue: Secondary | ICD-10-CM | POA: Diagnosis not present

## 2016-04-16 DIAGNOSIS — D123 Benign neoplasm of transverse colon: Secondary | ICD-10-CM | POA: Insufficient documentation

## 2016-04-16 DIAGNOSIS — R42 Dizziness and giddiness: Secondary | ICD-10-CM | POA: Diagnosis not present

## 2016-04-16 DIAGNOSIS — Z7982 Long term (current) use of aspirin: Secondary | ICD-10-CM | POA: Insufficient documentation

## 2016-04-16 DIAGNOSIS — Z7984 Long term (current) use of oral hypoglycemic drugs: Secondary | ICD-10-CM | POA: Diagnosis not present

## 2016-04-16 DIAGNOSIS — J449 Chronic obstructive pulmonary disease, unspecified: Secondary | ICD-10-CM | POA: Diagnosis not present

## 2016-04-16 DIAGNOSIS — E119 Type 2 diabetes mellitus without complications: Secondary | ICD-10-CM | POA: Insufficient documentation

## 2016-04-16 DIAGNOSIS — K219 Gastro-esophageal reflux disease without esophagitis: Secondary | ICD-10-CM | POA: Diagnosis not present

## 2016-04-16 DIAGNOSIS — Z87891 Personal history of nicotine dependence: Secondary | ICD-10-CM | POA: Insufficient documentation

## 2016-04-16 DIAGNOSIS — Z79899 Other long term (current) drug therapy: Secondary | ICD-10-CM | POA: Diagnosis not present

## 2016-04-16 LAB — CBC WITH DIFFERENTIAL/PLATELET
Basophils Absolute: 0.1 10*3/uL (ref 0–0.1)
Basophils Relative: 1 %
Eosinophils Absolute: 0.3 10*3/uL (ref 0–0.7)
Eosinophils Relative: 5 %
HCT: 36.6 % (ref 35.0–47.0)
Hemoglobin: 12.8 g/dL (ref 12.0–16.0)
Lymphocytes Relative: 26 %
Lymphs Abs: 1.4 10*3/uL (ref 1.0–3.6)
MCH: 31.9 pg (ref 26.0–34.0)
MCHC: 35 g/dL (ref 32.0–36.0)
MCV: 91.3 fL (ref 80.0–100.0)
Monocytes Absolute: 0.5 10*3/uL (ref 0.2–0.9)
Monocytes Relative: 9 %
Neutro Abs: 3.2 10*3/uL (ref 1.4–6.5)
Neutrophils Relative %: 59 %
Platelets: 282 10*3/uL (ref 150–440)
RBC: 4.01 MIL/uL (ref 3.80–5.20)
RDW: 13.5 % (ref 11.5–14.5)
WBC: 5.5 10*3/uL (ref 3.6–11.0)

## 2016-04-16 LAB — FERRITIN: Ferritin: 18 ng/mL (ref 11–307)

## 2016-04-21 ENCOUNTER — Ambulatory Visit: Payer: PPO | Admitting: Family Medicine

## 2016-04-23 ENCOUNTER — Other Ambulatory Visit: Payer: Self-pay | Admitting: Hematology and Oncology

## 2016-04-23 ENCOUNTER — Inpatient Hospital Stay: Payer: PPO

## 2016-04-23 VITALS — BP 114/72 | HR 96 | Temp 97.6°F | Resp 18

## 2016-04-23 DIAGNOSIS — D509 Iron deficiency anemia, unspecified: Secondary | ICD-10-CM | POA: Diagnosis not present

## 2016-04-23 MED ORDER — SODIUM CHLORIDE 0.9 % IV SOLN
200.0000 mg | Freq: Once | INTRAVENOUS | Status: AC
  Administered 2016-04-23: 200 mg via INTRAVENOUS
  Filled 2016-04-23: qty 10

## 2016-04-23 MED ORDER — SODIUM CHLORIDE 0.9 % IV SOLN
Freq: Once | INTRAVENOUS | Status: AC
  Administered 2016-04-23: 14:00:00 via INTRAVENOUS
  Filled 2016-04-23: qty 1000

## 2016-04-30 ENCOUNTER — Encounter (INDEPENDENT_AMBULATORY_CARE_PROVIDER_SITE_OTHER): Payer: Self-pay

## 2016-04-30 ENCOUNTER — Encounter: Payer: Self-pay | Admitting: *Deleted

## 2016-04-30 ENCOUNTER — Inpatient Hospital Stay: Payer: PPO

## 2016-04-30 ENCOUNTER — Encounter: Payer: Self-pay | Admitting: Hematology and Oncology

## 2016-04-30 ENCOUNTER — Other Ambulatory Visit: Payer: Self-pay | Admitting: Hematology and Oncology

## 2016-04-30 ENCOUNTER — Emergency Department
Admission: EM | Admit: 2016-04-30 | Discharge: 2016-04-30 | Disposition: A | Discharge status: Home / Self Care | Payer: PPO | Attending: Emergency Medicine | Admitting: Emergency Medicine

## 2016-04-30 ENCOUNTER — Inpatient Hospital Stay (HOSPITAL_BASED_OUTPATIENT_CLINIC_OR_DEPARTMENT_OTHER): Payer: PPO | Admitting: Hematology and Oncology

## 2016-04-30 ENCOUNTER — Emergency Department: Payer: PPO

## 2016-04-30 ENCOUNTER — Other Ambulatory Visit: Payer: Self-pay | Admitting: *Deleted

## 2016-04-30 ENCOUNTER — Telehealth: Payer: Self-pay | Admitting: Family Medicine

## 2016-04-30 VITALS — BP 134/78 | HR 106 | Temp 97.5°F | Resp 18 | Wt 207.2 lb

## 2016-04-30 DIAGNOSIS — I10 Essential (primary) hypertension: Secondary | ICD-10-CM

## 2016-04-30 DIAGNOSIS — K449 Diaphragmatic hernia without obstruction or gangrene: Secondary | ICD-10-CM

## 2016-04-30 DIAGNOSIS — Z87891 Personal history of nicotine dependence: Secondary | ICD-10-CM | POA: Insufficient documentation

## 2016-04-30 DIAGNOSIS — E039 Hypothyroidism, unspecified: Secondary | ICD-10-CM | POA: Insufficient documentation

## 2016-04-30 DIAGNOSIS — D509 Iron deficiency anemia, unspecified: Secondary | ICD-10-CM

## 2016-04-30 DIAGNOSIS — K648 Other hemorrhoids: Secondary | ICD-10-CM

## 2016-04-30 DIAGNOSIS — G8929 Other chronic pain: Secondary | ICD-10-CM

## 2016-04-30 DIAGNOSIS — Z79899 Other long term (current) drug therapy: Secondary | ICD-10-CM

## 2016-04-30 DIAGNOSIS — J449 Chronic obstructive pulmonary disease, unspecified: Secondary | ICD-10-CM

## 2016-04-30 DIAGNOSIS — D123 Benign neoplasm of transverse colon: Secondary | ICD-10-CM

## 2016-04-30 DIAGNOSIS — R0602 Shortness of breath: Secondary | ICD-10-CM | POA: Insufficient documentation

## 2016-04-30 DIAGNOSIS — D649 Anemia, unspecified: Secondary | ICD-10-CM

## 2016-04-30 DIAGNOSIS — Z7982 Long term (current) use of aspirin: Secondary | ICD-10-CM | POA: Insufficient documentation

## 2016-04-30 DIAGNOSIS — R0789 Other chest pain: Secondary | ICD-10-CM | POA: Diagnosis not present

## 2016-04-30 DIAGNOSIS — R079 Chest pain, unspecified: Secondary | ICD-10-CM | POA: Diagnosis not present

## 2016-04-30 DIAGNOSIS — R5383 Other fatigue: Secondary | ICD-10-CM

## 2016-04-30 DIAGNOSIS — E119 Type 2 diabetes mellitus without complications: Secondary | ICD-10-CM | POA: Insufficient documentation

## 2016-04-30 DIAGNOSIS — R42 Dizziness and giddiness: Secondary | ICD-10-CM

## 2016-04-30 DIAGNOSIS — Z7984 Long term (current) use of oral hypoglycemic drugs: Secondary | ICD-10-CM | POA: Insufficient documentation

## 2016-04-30 DIAGNOSIS — K219 Gastro-esophageal reflux disease without esophagitis: Secondary | ICD-10-CM

## 2016-04-30 LAB — COMPREHENSIVE METABOLIC PANEL
ALBUMIN: 4.2 g/dL (ref 3.5–5.0)
ALT: 20 U/L (ref 14–54)
ANION GAP: 9 (ref 5–15)
AST: 24 U/L (ref 15–41)
Alkaline Phosphatase: 88 U/L (ref 38–126)
BUN: 21 mg/dL — ABNORMAL HIGH (ref 6–20)
CALCIUM: 9.7 mg/dL (ref 8.9–10.3)
CHLORIDE: 105 mmol/L (ref 101–111)
CO2: 25 mmol/L (ref 22–32)
Creatinine, Ser: 0.85 mg/dL (ref 0.44–1.00)
GFR calc non Af Amer: >60 mL/min (ref >60)
GLUCOSE: 182 mg/dL — AB (ref 65–99)
POTASSIUM: 4.4 mmol/L (ref 3.5–5.1)
SODIUM: 139 mmol/L (ref 135–145)
Total Bilirubin: 0.4 mg/dL (ref 0.3–1.2)
Total Protein: 7.7 g/dL (ref 6.5–8.1)

## 2016-04-30 LAB — CBC
HCT: 35.6 % (ref 35.0–47.0)
Hemoglobin: 12.5 g/dL (ref 12.0–16.0)
MCH: 32.6 pg (ref 26.0–34.0)
MCHC: 35.2 g/dL (ref 32.0–36.0)
MCV: 92.5 fL (ref 80.0–100.0)
PLATELETS: 251 10*3/uL (ref 150–440)
RBC: 3.85 MIL/uL (ref 3.80–5.20)
RDW: 13.5 % (ref 11.5–14.5)
WBC: 8.1 10*3/uL (ref 3.6–11.0)

## 2016-04-30 LAB — TROPONIN I

## 2016-04-30 LAB — BRAIN NATRIURETIC PEPTIDE: B Natriuretic Peptide: 15 pg/mL (ref 0.0–100.0)

## 2016-04-30 MED ORDER — IOPAMIDOL (ISOVUE-370) INJECTION 76%
75.0000 mL | Freq: Once | INTRAVENOUS | Status: AC | PRN
  Administered 2016-04-30: 75 mL via INTRAVENOUS

## 2016-04-30 NOTE — ED Provider Notes (Addendum)
[facility] Emergency Department Provider Note  ____________________________________________   I have reviewed the triage vital signs and the nursing notes.   HISTORY  Chief Complaint Chest Pain    HPI Leslie Olsen is a 65 y.o. female Presents today c/o cp and sob every day, all day, since April. She states she has seen her primary care doctor for this, she has seen a cardiologist had a stress test and a echo that was negative, she also states she saw a pulmonologist who feels that this is not attributable to her COPD. She states her blood work and her x-rays never reveal the cause of this. The pain is there all the time. It is a crushing pain. It is there every minute of every day. Nothing makes it better nothing makes it worse. She does not have any exertional symptoms per se. She denies any fever or chills.denies cough. She has has had normal colonoscopy. Has a history of anemia in the past. History of thyroid issues. Has not had a CT scan of her chest in the last 3 years. Does not seem to have any generalized muscle fatigability, there is no change in symptoms between the morning and night, no recent tick bite, no feelings of focal numbness or weakness. She does not have any difficulty swallowing, she does not have a sensation of foreign body in her throat. She is very frustrated that she has chronic every day every hour chest pain. She was seen by her doctor who sent her to the emergency room apparently for further evaluation today. She has no leg swelling or history in herself or family PE or DVT she does sleep with a BiPAP. She states she has had early morning wakening she denies tearfulness and hematuria and she denies any SI or HI. She does not endorse panic attacks or depression     Past Medical History:  Diagnosis Date  . Anemia   . Chronic headaches   . COPD (chronic obstructive pulmonary disease) ([place])   . Diabetes mellitus without complication  ([place])   . GERD (gastroesophageal reflux disease)   . Hypertension   . Hypothyroidism     Patient Active Problem List   Diagnosis Date Noted  . Thyroid activity decreased 02/21/2016  . Atherosclerosis of native coronary artery of native heart without angina pectoris 12/30/2015  . Shortness of breath 12/12/2015  . Essential hypertension, benign 12/12/2015  . Obesity 12/12/2015  . Dyspnea 11/27/2015  . Iron deficiency anemia 10/22/2015  . Occipital neuralgia 10/11/2015  . Essential hypertension 07/11/2015  . Hyperlipidemia 07/11/2015  . Diabetes mellitus without complication ([place]) 99991111  . Cephalalgia 04/08/2015  . Anemia 05/23/2014  . Allergic rhinitis 01/09/2014  . Hoarseness or changing voice 08/25/2013  . OSA on CPAP 01/24/2013  . Multiple pulmonary nodules 11/02/2012  . COPD (chronic obstructive pulmonary disease) ([place]) 11/01/2012    Past Surgical History:  Procedure Laterality Date  . ABDOMINAL HYSTERECTOMY    . BACK SURGERY    . CHOLECYSTECTOMY    . CORONARY STENT PLACEMENT    . REDUCTION MAMMAPLASTY    . REPLACEMENT TOTAL KNEE    . SHOULDER SURGERY    . TIBIA FRACTURE SURGERY      Prior to Admission medications   Medication Sig Start Date End Date Taking? Authorizing Provider  albuterol (PROVENTIL HFA;VENTOLIN HFA) 108 (90 Base) MCG/ACT inhaler Inhale 2 puffs into the lungs every 6 (six) hours as needed for wheezing. 02/21/16   [place],  DO  aspirin 81 MG tablet Take 81 mg by mouth daily.    Historical Provider, MD  budesonide-formoterol (SYMBICORT) 160-4.5 MCG/ACT inhaler Inhale 1 puff into the lungs 2 (two) times daily. 11/27/15   Vishal Mungal, MD  ferrous fumarate (HEMOCYTE - 106 MG FE) 325 (106 FE) MG TABS tablet Take 1 tablet by mouth daily.    Historical Provider, MD  gabapentin (NEURONTIN) 300 MG capsule Take 1 capsule (300 mg total) by mouth 3 (three) times daily. 02/21/16   Megan P Johnson, DO  glucose blood (COOL BLOOD GLUCOSE TEST STRIPS) test  strip Use as instructed 07/17/15   Megan P Johnson, DO  levothyroxine (SYNTHROID, LEVOTHROID) 75 MCG tablet Take 1 tablet (75 mcg total) by mouth daily. Brand name required 02/24/16   Megan P Johnson, DO  lisinopril (PRINIVIL,ZESTRIL) 5 MG tablet Take 1 tablet (5 mg total) by mouth daily. 02/21/16   Megan P Johnson, DO  metFORMIN (GLUCOPHAGE) 500 MG tablet Take 1 tablet (500 mg total) by mouth 2 (two) times daily with a meal. 02/21/16   Megan P Johnson, DO  nortriptyline (PAMELOR) 25 MG capsule Take 1 capsule (25 mg total) by mouth at bedtime. 02/21/16   Megan P Johnson, DO  Omega-3 Fatty Acids (FISH OIL) 1200 MG CAPS Take 1 capsule by mouth daily.    Historical Provider, MD  rosuvastatin (CRESTOR) 40 MG tablet Take 1 tablet (40 mg total) by mouth daily. 03/20/16   Megan P Johnson, DO  SUMAtriptan (IMITREX) 100 MG tablet Take 100 mg by mouth every 2 (two) hours as needed for migraine. May repeat in 2 hours if headache persists or recurs.    Historical Provider, MD    Allergies Review of patient's allergies indicates no known allergies.  Family History  Problem Relation Age of Onset  . Lung cancer Maternal Aunt   . Breast cancer Maternal Aunt 57  . Other Mother     Killed in car accident, 81  . Other Father     Killed in car accident, 42  . Thyroid disease Brother   . Thyroid disease Sister     Social History Social History  Substance Use Topics  . Smoking status: Former Smoker    Packs/day: 2.00    Years: 47.00    Types: Cigarettes    Quit date: 11/06/2011  . Smokeless tobacco: Never Used  . Alcohol use No    Review of Systems Constitutional: No fever/chills Eyes: No visual changes. ENT: No sore throat. No stiff neck no neck pain Cardiovascular: chest pain. Respiratory: shortness of breath. Gastrointestinal:   no vomiting.  No diarrhea.  No constipation. Genitourinary: Negative for dysuria. Musculoskeletal: Negative lower extremity swelling Skin: Negative for  rash. Neurological: Negative for severe headaches, focal weakness or numbness. 10-point ROS otherwise negative.  ____________________________________________   PHYSICAL EXAM:  VITAL SIGNS: ED Triage Vitals  Enc Vitals Group     BP 04/30/16 1423 (!) 160/98     Pulse Rate 04/30/16 1423 95     Resp 04/30/16 1423 20     Temp 04/30/16 1423 97.7 F (36.5 C)     Temp Source 04/30/16 1423 Oral     SpO2 04/30/16 1423 99 %     Weight 04/30/16 1424 205 lb (93 kg)     Height 04/30/16 1424 5\' 6"  (1.676 m)     Head Circumference --      Peak Flow --      Pain Score 04/30/16 1424 0  Pain Loc --      Pain Edu? --      Excl. in [place]? --     Constitutional: Alert and oriented. Well appearing and in no acute distress, she is anxious and upset   Eyes: Conjunctivae are normal. PERRL. EOMI. Head: Atraumatic. Nose: No congestion/rhinnorhea. Mouth/Throat: Mucous membranes are moist.  Oropharynx non-erythematous. Neck: No stridor.   Nontender with no meningismus Cardiovascular: Normal rate, regular rhythm. Grossly normal heart sounds.  Good peripheral circulation. Respiratory: Normal respiratory effort.  No retractions. Lungs CTAB. Seems to speak in a rather forced voice, however there is no evidence of stridoror true hoarse voice Abdominal: Soft and nontender. No distention. No guarding no rebound Back:  There is no focal tenderness or step off.  there is no midline tenderness there are no lesions noted. there is no CVA tenderness Musculoskeletal: No lower extremity tenderness, no upper extremity tenderness. No joint effusions, no DVT signs strong distal pulses no edema Neurologic:  Normal speech and language. No gross focal neurologic deficits are appreciated.  Skin:  Skin is warm, dry and intact. No rash noted. Psychiatric: Mood and affect are anxious. Speech and behavior are normal.  ____________________________________________   LABS (all labs ordered are listed, but only abnormal  results are displayed)  Labs Reviewed  CBC  TROPONIN I  COMPREHENSIVE METABOLIC PANEL   ____________________________________________  EKG  I personally interpreted any EKGs ordered by me or triage Normal sinus rhythm at 90 beats per an acute ST elevation or acute ST depression normal axis ____________________________________________  RADIOLOGY  I reviewed any imaging ordered by me or triage that were performed during my shift and, if possible, patient and/or family made aware of any abnormal findings. ____________________________________________   PROCEDURES  Procedure(s) performed: None  Procedures  Critical Care performed: None  ____________________________________________   INITIAL IMPRESSION / ASSESSMENT AND PLAN / ED COURSE  Pertinent labs & imaging results that were available during my care of the patient were reviewed by me and considered in my medical decision making (see chart for details).   Patient with chronic every day chest pain that lasts all the time for 4-5 months. Very unlikely to be ACS but we'll check a troponin. Patient has not had a recent CT scan, his certainly could represent an oncologic process although I have low suspicion will obtain a CT scan of her chest will also rule out PE. Patient may be having some sort of a muscle issue with the muscles of respiration affected but her neurologic exam is otherwise normal. There is no evidence for example of myasthenia gravis. It is unlikely afterup in this much time with this chronic pain that we will come with the definitive answer in the emergency room but we will assess her closely.  ----------------------------------------- 4:49 PM on 04/30/2016 -----------------------------------------  Pt and family made aware of findings on ct. I d/w her PCP Dr. Wynetta Emery, who knows pt well. She has had extensive outpt w/u on this and dr. Wynetta Emery says this has been going on for the last 2 years despite extensive  cardio/pulmonary/gi w/u.  They feel this may be 2/2 anxiety given neg w/u and atypical sx and they have tried antianxiety meds. In any event, patient is much calmer at this time initially she was quite anxious and upset now that her family is here she is much calmer and her symptoms seem to come and go with her anxiety. There is no further workup that I think that I can do that  will supersede that which is really benign and comprehensive outpatient workup.     Clinical Course   ____________________________________________   FINAL CLINICAL IMPRESSION(S) / ED DIAGNOSES  Final diagnoses:  None      This chart was dictated using voice recognition software.  Despite best efforts to proofread,  errors can occur which can change meaning.      Schuyler Amor, MD 04/30/16 [place], MD 04/30/16 [phone]

## 2016-04-30 NOTE — Telephone Encounter (Signed)
Called by the ER. Sent there today for her CP. They did CT of her chest- essentially normal. ER wants close follow up. Concern for psychogenic. Will see her tomorrow at 1:30PM

## 2016-04-30 NOTE — Progress Notes (Signed)
Patient is here for follow up, she mentions some shortness of breath with activity and sitting down. Patient stats she feels like she has heaviness on her chest. Says she normally does not sound like this when she talks. Says she feels as if she is choking. She is a former smoker but has quit for four years

## 2016-04-30 NOTE — Progress Notes (Signed)
[facility] day:  04/30/16   Chief Complaint: Leslie Olsen is an 65 y.o. female with iron deficiency anemia who seen for 3 month assessment.  HPI: The patient was last seen in the medical oncology clinic on 01/22/2016.  At that time, she noted persistent fatigue and chronic shortness of breath.  Exam was stable.  Hematocrit and MCV were normal.   Ferritin was 15 (low).  She received Venofer 200 mg IV x 2 (05/24 and 02/05/2016).  CBC on 04/16/2016 revealed a hematocrit of 36.6, hemoglobin 12.8, and MCV 91.3.  Ferritin was 18.  She received Venofer 200 mg on 04/23/2016.  She states that she was seen by Dr. Stevenson Clinch.  CXR revealed "nothing wrong".  Symptomatically, she feels horrible today.  She notes shortness of breath with rest and activity.  She feels like something is sitting on her chest.  She feels like she is choking.  She denies any pain radiating to her jaw or left arm.  She is dizzy.   Past Medical History:  Diagnosis Date  . Anemia   . Chronic headaches   . COPD (chronic obstructive pulmonary disease) ([place])   . Diabetes mellitus without complication ([place])   . GERD (gastroesophageal reflux disease)   . Hypertension   . Hypothyroidism     Past Surgical History:  Procedure Laterality Date  . ABDOMINAL HYSTERECTOMY    . BACK SURGERY    . CHOLECYSTECTOMY    . CORONARY STENT PLACEMENT    . REDUCTION MAMMAPLASTY    . REPLACEMENT TOTAL KNEE    . SHOULDER SURGERY    . TIBIA FRACTURE SURGERY      Family History  Problem Relation Age of Onset  . Lung cancer Maternal Aunt   . Breast cancer Maternal Aunt 57  . Other Mother     Killed in car accident, 3  . Other Father     Killed in car accident, 105  . Thyroid disease Brother   . Thyroid disease Sister     Social History:  reports that she quit smoking about 4 years ago. Her smoking use included Cigarettes. She has a 94.00 pack-year smoking history. She has never used  smokeless tobacco. She reports that she does not drink alcohol or use drugs.  She is alone today.  Allergies: No Known Allergies  Current Medications: Current Outpatient Prescriptions  Medication Sig Dispense Refill  . albuterol (PROVENTIL HFA;VENTOLIN HFA) 108 (90 Base) MCG/ACT inhaler Inhale 2 puffs into the lungs every 6 (six) hours as needed for wheezing. 6.7 g 5  . aspirin 81 MG tablet Take 81 mg by mouth daily.    . budesonide-formoterol (SYMBICORT) 160-4.5 MCG/ACT inhaler Inhale 1 puff into the lungs 2 (two) times daily. 1 Inhaler 5  . ferrous fumarate (HEMOCYTE - 106 MG FE) 325 (106 FE) MG TABS tablet Take 1 tablet by mouth daily.    Marland Kitchen gabapentin (NEURONTIN) 300 MG capsule Take 1 capsule (300 mg total) by mouth 3 (three) times daily. 270 capsule 3  . glucose blood (COOL BLOOD GLUCOSE TEST STRIPS) test strip Use as instructed 100 each 12  . levothyroxine (SYNTHROID, LEVOTHROID) 75 MCG tablet Take 1 tablet (75 mcg total) by mouth daily. Brand name required 90 tablet 3  . lisinopril (PRINIVIL,ZESTRIL) 5 MG tablet Take 1 tablet (5 mg total) by mouth daily. 90 tablet 1  . metFORMIN (GLUCOPHAGE) 500 MG tablet Take 1 tablet (500 mg total) by mouth 2 (two)  times daily with a meal. 360 tablet 1  . nortriptyline (PAMELOR) 25 MG capsule Take 1 capsule (25 mg total) by mouth at bedtime. 90 capsule 1  . Omega-3 Fatty Acids (FISH OIL) 1200 MG CAPS Take 1 capsule by mouth daily.    . rosuvastatin (CRESTOR) 40 MG tablet Take 1 tablet (40 mg total) by mouth daily. 30 tablet 3  . SUMAtriptan (IMITREX) 100 MG tablet Take 100 mg by mouth every 2 (two) hours as needed for migraine. May repeat in 2 hours if headache persists or recurs.     No current facility-administered medications for this visit.     Review of Systems:  GENERAL:  Feels "horrible".  No fevers or sweats.  Weight up 5 pounds. PERFORMANCE STATUS (ECOG):  2 HEENT:  No visual changes, runny nose, sore throat, mouth sores or  tenderness. Lungs:  COPD.  Acute on chronic shortness of breath.  Rare cough.  No pleuritic chest pain.  No hemoptysis.  Uses CPAP. Cardiac:  Feels heaviness on chest today.  No chest pain, palpitations, or PND.  Two pillow orthopnea.  Notes prior stress test and EKG negative. GI:  No nausea, vomiting, diarrhea, constipation, melena or hematochezia. GU:  No urgency, frequency, dysuria, or hematuria. Musculoskeletal:  No back pain.  No joint pain.  No muscle tenderness. Extremities:  No pain or swelling. Skin:  No rashes or skin changes. Neuro:  Headaches, left side of head. No numbness or weakness, balance or coordination issues. Endocrine:  Hypothyroid on Synthroid.  Diabetes.  No hot flashes or night sweats. Psych:  No mood changes, depression or anxiety. Pain:  No focal pain. Review of systems:  All other systems reviewed and found to be negative.   Physical Exam: Blood pressure 134/78, pulse (!) 106, temperature 97.5 F (36.4 C), temperature source Tympanic, resp. rate 18, weight 207 lb 3.7 oz (94 kg), SpO2 99 %.  GENERAL:  Well developed, well nourished, woman uncomfortable in moderate distress in the exam room.  She is not diaphoretic. MENTAL STATUS:  Alert and oriented to person, place and time. HEAD:  Short gray hair.  Normocephalic, atraumatic, face symmetric, no Cushingoid features. EYES:  Blue eyes.  Pupils equal round and reactive to light and accomodation.  No conjunctivitis or scleral icterus. ENT:  Oropharynx clear without lesion.  Partial.  Tongue normal.  Mucous membranes moist.  RESPIRATORY:  Clear to auscultation without rales, wheezes or rhonchi.  CARDIOVASCULAR:  Regular rate and rhythm without murmur, rub or gallop. ABDOMEN:  Soft, non-tender, with active bowel sounds, and no appreciable hepatosplenomegaly.  No masses. SKIN:  No rashes, ulcers or lesions. EXTREMITIES: No edema, no skin discoloration or tenderness.  No palpable cords. LYMPH NODES: No palpable cervical,  supraclavicular, axillary or inguinal adenopathy  NEUROLOGICAL: Unremarkable. PSYCH:  Appropriate.    No visits with results within 3 Day(s) from this visit.  Latest known visit with results is:  Appointment on 04/16/2016  Component Date Value Ref Range Status  . WBC 04/16/2016 5.5  3.6 - 11.0 K/uL Final  . RBC 04/16/2016 4.01  3.80 - 5.20 MIL/uL Final  . Hemoglobin 04/16/2016 12.8  12.0 - 16.0 g/dL Final  . HCT 04/16/2016 36.6  35.0 - 47.0 % Final  . MCV 04/16/2016 91.3  80.0 - 100.0 fL Final  . MCH 04/16/2016 31.9  26.0 - 34.0 pg Final  . MCHC 04/16/2016 35.0  32.0 - 36.0 g/dL Final  . RDW 04/16/2016 13.5  11.5 - 14.5 % Final  .  Platelets 04/16/2016 282  150 - 440 K/uL Final  . Neutrophils Relative % 04/16/2016 59  % Final  . Neutro Abs 04/16/2016 3.2  1.4 - 6.5 K/uL Final  . Lymphocytes Relative 04/16/2016 26  % Final  . Lymphs Abs 04/16/2016 1.4  1.0 - 3.6 K/uL Final  . Monocytes Relative 04/16/2016 9  % Final  . Monocytes Absolute 04/16/2016 0.5  0.2 - 0.9 K/uL Final  . Eosinophils Relative 04/16/2016 5  % Final  . Eosinophils Absolute 04/16/2016 0.3  0 - 0.7 K/uL Final  . Basophils Relative 04/16/2016 1  % Final  . Basophils Absolute 04/16/2016 0.1  0 - 0.1 K/uL Final  . Ferritin 04/16/2016 18  11 - 307 ng/mL Final    Assessment:  Leslie Olsen is an 65 y.o. female with recurrent iron deficiency anemia.  EGD on 06/05/2014 revealed a hiatal hernia and gastritis.  Colonoscopy on 06/05/2014 noted revealed a poor prep.  There was a 7 mm polyp in the transverse colon and non-bleeding internal hemorrhoids.  Her diet is good.  She denies any melena or hematochezia.  She denies any uterine bleeding.    She has a 90 pack year smoking history.  She has COPD.  Chest CT on 08/23/2013 revealed a questionable lesion at the pleura.  Chest CT on 08/23/2014 suggested benign etiology with similar scattered pulmonary nodules, unchanged ground glass opacity posterior right upper lobe, subtle  subpleural reticulation, and similar low density enlargement of both adrenals.  CXR on 11/27/2015 revealed no active cardiopulmonary disease.  She has mild stable COPD per Dr. Stevenson Clinch on 01/07/2016.  Cardiology evaluation revealed a normal exercise nuclear stress test on 12/18/2015 and echocardiogram on 11/2015 (EF 60-65%).  She has sleep apnea and is using her CPAP again.  She was on oral iron x 2 years.  She developed recurrent iron deficiency anemia.  She denies any melena or hematochezia.  She received Venofer 600 mg (10/29/2015 - 11/12/2015) and 400 mg (01/29/2016 - 02/05/2016) and 200 mg (04/23/2016).  Popcorn and ice pica have resolved.  Hematocrit is 35.6 today.  Ferritin was 18 on 04/16/2016.  Symptomatically, she has acute on chronic shortness of breath.  She feels like someone is sitting on her chest.  Etiology is unclear (? cardiac, embolism).   Plan: 1.  Review labs from 04/16/2016.  Discuss plan for Venofer when current acute episode resolves. 2.  Transport patient to ER. 3.  Nurse to call patient about rescheduling Venofer. 4.  Follow-up with Dr. Allen Norris regarding capsule enteroscopy. 5.  RTC in 3 months for labs (CBC with diff, ferritin). 6.  RTC in 6 months for MD assess, labs (CBC with diff, ferritin-day before), and +/- Venofer.   Lequita Asal, MD  04/30/2016 , 1:44 PM

## 2016-04-30 NOTE — ED Triage Notes (Signed)
Patient presents to the ED with shortness of breath and chest tightness from doctor's office.

## 2016-05-01 ENCOUNTER — Ambulatory Visit (INDEPENDENT_AMBULATORY_CARE_PROVIDER_SITE_OTHER): Payer: PPO | Admitting: Family Medicine

## 2016-05-01 ENCOUNTER — Encounter: Payer: Self-pay | Admitting: Family Medicine

## 2016-05-01 ENCOUNTER — Other Ambulatory Visit: Payer: Self-pay

## 2016-05-01 VITALS — BP 126/83 | HR 86 | Temp 98.2°F | Wt 204.0 lb

## 2016-05-01 DIAGNOSIS — D509 Iron deficiency anemia, unspecified: Secondary | ICD-10-CM

## 2016-05-01 DIAGNOSIS — R079 Chest pain, unspecified: Secondary | ICD-10-CM | POA: Diagnosis not present

## 2016-05-01 DIAGNOSIS — J383 Other diseases of vocal cords: Secondary | ICD-10-CM | POA: Insufficient documentation

## 2016-05-01 NOTE — Assessment & Plan Note (Signed)
Has had EGD, Colonoscopy and barium swallow without cause identified. Still needing iron infusions on a regular basis. Will get back into GI for possible capsule endoscopy. Await results.

## 2016-05-01 NOTE — Assessment & Plan Note (Addendum)
Will get her into [facility]. Last ENT evaluation not helpful. Will get her into vocal specialist. Offered speech therapy, she would like to hold at this time. Continue to monitor.

## 2016-05-01 NOTE — Progress Notes (Signed)
BP 126/83 (BP Location: Left Arm, Patient Position: Sitting, Cuff Size: Large)   Pulse 86   Temp 98.2 F (36.8 C)   Wt 204 lb (92.5 kg)   SpO2 98%   BMI 32.93 kg/m    Subjective:    Patient ID: Leslie Olsen, female    DOB: October 29, 1950, 65 y.o.   MRN: [id number]  HPI: Leslie Olsen is a 65 y.o. female  Chief Complaint  Patient presents with  . ER Follow up   ER FOLLOW UP Time since discharge: Less than 24 hours Hospital/facility: [facility] Diagnosis: Chest pain- ?myofascial Procedures/tests: EKG, labs CT chest Consultants: None New medications: None Discharge instructions: Follow up here  Status: stable  Very frustrated. Voice still coming and going. Having trouble speaking. Pain in abdomen and chest around her diaphragm. Very upset and worse when she's upset. Notes that she is still needing the iron infusions. No better. Still losing blood from somewhere, but not sure where. Had colonoscopy, had EGD, had barium swallow. Has not had capsule endoscopy. Not seeing any blood. Very upset.   Relevant past medical, surgical, family and social history reviewed and updated as indicated. Interim medical history since our last visit reviewed. Allergies and medications reviewed and updated.  Review of Systems  Constitutional: Positive for fatigue. Negative for activity change, appetite change, chills, diaphoresis, fever and unexpected weight change.  HENT: Positive for voice change. Negative for congestion, dental problem, drooling, ear discharge, ear pain, facial swelling, hearing loss, mouth sores, nosebleeds, postnasal drip, rhinorrhea, sinus pressure, sneezing, sore throat, tinnitus and trouble swallowing.   Respiratory: Positive for shortness of breath. Negative for apnea, cough, choking, chest tightness, wheezing and stridor.   Cardiovascular: Positive for chest pain. Negative for palpitations and leg swelling.  Psychiatric/Behavioral: Positive for agitation. Negative for  behavioral problems, confusion, decreased concentration, dysphoric mood, hallucinations, self-injury, sleep disturbance and suicidal ideas. The patient is nervous/anxious. The patient is not hyperactive.     Per HPI unless specifically indicated above     Objective:    BP 126/83 (BP Location: Left Arm, Patient Position: Sitting, Cuff Size: Large)   Pulse 86   Temp 98.2 F (36.8 C)   Wt 204 lb (92.5 kg)   SpO2 98%   BMI 32.93 kg/m   Wt Readings from Last 3 Encounters:  05/01/16 204 lb (92.5 kg)  04/30/16 205 lb (93 kg)  04/30/16 207 lb 3.7 oz (94 kg)    Physical Exam  Constitutional: She is oriented to person, place, and time. She appears well-developed and well-nourished. No distress.  HENT:  Head: Normocephalic and atraumatic.  Right Ear: Hearing normal.  Left Ear: Hearing normal.  Nose: Nose normal.  Strangled voice- visible abdominal movements to get voice out  Eyes: Conjunctivae and lids are normal. Right eye exhibits no discharge. Left eye exhibits no discharge. No scleral icterus.  Cardiovascular: Normal rate, regular rhythm, normal heart sounds and intact distal pulses.  Exam reveals no gallop and no friction rub.   No murmur heard. Pulmonary/Chest: Effort normal and breath sounds normal. No respiratory distress. She has no wheezes. She has no rales. She exhibits no tenderness.  Abdominal: There is tenderness (diaphragm bilaterally).  Musculoskeletal: Normal range of motion.  Neurological: She is alert and oriented to person, place, and time.  Skin: Skin is warm, dry and intact. No rash noted. She is not diaphoretic. No erythema. No pallor.  Psychiatric: She has a normal mood and affect. Her speech is normal and behavior  is normal. Judgment and thought content normal. Cognition and memory are normal.    Results for orders placed or performed in visit on 05/01/16  Hemoglobin A1c  Result Value Ref Range   Hemoglobin A1C 7.1       Assessment & Plan:   Problem List  Items Addressed This Visit      Nervous and Auditory   Dysphonia spastica - Primary    Will get her into [facility]. Last ENT evaluation not helpful. Will get her into vocal specialist. Offered speech therapy, she would like to hold at this time. Continue to monitor.       Relevant Orders   Ambulatory referral to ENT     Other   Anemia    Has had EGD, Colonoscopy and barium swallow without cause identified. Still needing iron infusions on a regular basis. Will get back into GI for possible capsule endoscopy. Await results.        Other Visit Diagnoses    Chest pain, unspecified chest pain type       Seems to be due to muscle tension related to her voice. See how she does once that's treated.        Follow up plan: Return in about 4 weeks (around 05/29/2016) for DM visit and follow up voice.

## 2016-05-05 ENCOUNTER — Inpatient Hospital Stay: Payer: PPO

## 2016-05-05 ENCOUNTER — Encounter: Payer: Self-pay | Admitting: Certified Registered"

## 2016-05-05 ENCOUNTER — Ambulatory Visit
Admission: RE | Admit: 2016-05-05 | Discharge: 2016-05-05 | Disposition: A | Discharge status: Home / Self Care | Payer: PPO | Source: Ambulatory Visit | Attending: Gastroenterology | Admitting: Gastroenterology

## 2016-05-05 ENCOUNTER — Encounter
Admission: RE | Disposition: A | Discharge status: Home / Self Care | Payer: Self-pay | Source: Ambulatory Visit | Attending: Gastroenterology

## 2016-05-05 DIAGNOSIS — D509 Iron deficiency anemia, unspecified: Secondary | ICD-10-CM | POA: Diagnosis not present

## 2016-05-05 DIAGNOSIS — Q2733 Arteriovenous malformation of digestive system vessel: Secondary | ICD-10-CM | POA: Diagnosis not present

## 2016-05-05 DIAGNOSIS — Z79899 Other long term (current) drug therapy: Secondary | ICD-10-CM | POA: Insufficient documentation

## 2016-05-05 DIAGNOSIS — Z7982 Long term (current) use of aspirin: Secondary | ICD-10-CM | POA: Insufficient documentation

## 2016-05-05 DIAGNOSIS — K259 Gastric ulcer, unspecified as acute or chronic, without hemorrhage or perforation: Secondary | ICD-10-CM | POA: Diagnosis not present

## 2016-05-05 HISTORY — PX: GIVENS CAPSULE STUDY: SHX5432

## 2016-05-05 SURGERY — IMAGING PROCEDURE, GI TRACT, INTRALUMINAL, VIA CAPSULE
Anesthesia: General

## 2016-05-06 ENCOUNTER — Encounter: Payer: Self-pay | Admitting: Gastroenterology

## 2016-05-07 ENCOUNTER — Inpatient Hospital Stay: Payer: PPO

## 2016-05-07 ENCOUNTER — Other Ambulatory Visit: Payer: Self-pay | Admitting: Hematology and Oncology

## 2016-05-07 VITALS — BP 106/69 | HR 96 | Temp 96.3°F

## 2016-05-07 DIAGNOSIS — D509 Iron deficiency anemia, unspecified: Secondary | ICD-10-CM

## 2016-05-07 MED ORDER — SODIUM CHLORIDE 0.9 % IV SOLN
Freq: Once | INTRAVENOUS | Status: AC
  Administered 2016-05-07: 14:00:00 via INTRAVENOUS
  Filled 2016-05-07: qty 1000

## 2016-05-07 MED ORDER — IRON SUCROSE 20 MG/ML IV SOLN
200.0000 mg | Freq: Once | INTRAVENOUS | Status: AC
  Administered 2016-05-07: 200 mg via INTRAVENOUS
  Filled 2016-05-07: qty 10

## 2016-05-13 ENCOUNTER — Telehealth: Payer: Self-pay | Admitting: Gastroenterology

## 2016-05-13 ENCOUNTER — Encounter: Payer: Self-pay | Admitting: Gastroenterology

## 2016-05-13 NOTE — Telephone Encounter (Signed)
Returned pt's call regarding her capsule endoscopy and saw your recommendation for pt to have a push enteroscopy. She will need to be referred out to [facility] for procedure correct?

## 2016-05-13 NOTE — Telephone Encounter (Signed)
results

## 2016-05-14 DIAGNOSIS — J383 Other diseases of vocal cords: Secondary | ICD-10-CM | POA: Diagnosis not present

## 2016-05-14 DIAGNOSIS — Z7951 Long term (current) use of inhaled steroids: Secondary | ICD-10-CM | POA: Diagnosis not present

## 2016-05-14 DIAGNOSIS — E119 Type 2 diabetes mellitus without complications: Secondary | ICD-10-CM | POA: Diagnosis not present

## 2016-05-14 DIAGNOSIS — K219 Gastro-esophageal reflux disease without esophagitis: Secondary | ICD-10-CM | POA: Diagnosis not present

## 2016-05-14 DIAGNOSIS — R51 Headache: Secondary | ICD-10-CM | POA: Diagnosis not present

## 2016-05-14 DIAGNOSIS — Z7982 Long term (current) use of aspirin: Secondary | ICD-10-CM | POA: Diagnosis not present

## 2016-05-14 DIAGNOSIS — Z7984 Long term (current) use of oral hypoglycemic drugs: Secondary | ICD-10-CM | POA: Diagnosis not present

## 2016-05-14 DIAGNOSIS — Z87891 Personal history of nicotine dependence: Secondary | ICD-10-CM | POA: Diagnosis not present

## 2016-05-14 DIAGNOSIS — D649 Anemia, unspecified: Secondary | ICD-10-CM | POA: Diagnosis not present

## 2016-05-14 DIAGNOSIS — J449 Chronic obstructive pulmonary disease, unspecified: Secondary | ICD-10-CM | POA: Diagnosis not present

## 2016-05-14 DIAGNOSIS — Z79899 Other long term (current) drug therapy: Secondary | ICD-10-CM | POA: Diagnosis not present

## 2016-05-14 DIAGNOSIS — E039 Hypothyroidism, unspecified: Secondary | ICD-10-CM | POA: Diagnosis not present

## 2016-05-14 DIAGNOSIS — Z6834 Body mass index (BMI) 34.0-34.9, adult: Secondary | ICD-10-CM | POA: Diagnosis not present

## 2016-05-14 DIAGNOSIS — R490 Dysphonia: Secondary | ICD-10-CM | POA: Diagnosis not present

## 2016-05-14 DIAGNOSIS — J984 Other disorders of lung: Secondary | ICD-10-CM | POA: Diagnosis not present

## 2016-05-14 DIAGNOSIS — I10 Essential (primary) hypertension: Secondary | ICD-10-CM | POA: Diagnosis not present

## 2016-05-14 NOTE — Telephone Encounter (Signed)
I do it.

## 2016-05-14 NOTE — Telephone Encounter (Signed)
Did you talk to dr Allen Norris today? She would like an answer soon.

## 2016-05-19 ENCOUNTER — Other Ambulatory Visit: Payer: Self-pay

## 2016-05-19 NOTE — Telephone Encounter (Signed)
Pt scheduled for EGD at [facility] on 05/26/16. Instructs mailed.

## 2016-05-25 ENCOUNTER — Encounter: Payer: Self-pay | Admitting: *Deleted

## 2016-05-26 ENCOUNTER — Ambulatory Visit: Payer: PPO | Admitting: Certified Registered Nurse Anesthetist

## 2016-05-26 ENCOUNTER — Encounter
Admission: RE | Disposition: A | Discharge status: Home / Self Care | Payer: Self-pay | Source: Ambulatory Visit | Attending: Gastroenterology

## 2016-05-26 ENCOUNTER — Ambulatory Visit
Admission: RE | Admit: 2016-05-26 | Discharge: 2016-05-26 | Disposition: A | Discharge status: Home / Self Care | Payer: PPO | Source: Ambulatory Visit | Attending: Gastroenterology | Admitting: Gastroenterology

## 2016-05-26 DIAGNOSIS — Z955 Presence of coronary angioplasty implant and graft: Secondary | ICD-10-CM | POA: Insufficient documentation

## 2016-05-26 DIAGNOSIS — E119 Type 2 diabetes mellitus without complications: Secondary | ICD-10-CM | POA: Insufficient documentation

## 2016-05-26 DIAGNOSIS — D649 Anemia, unspecified: Secondary | ICD-10-CM | POA: Insufficient documentation

## 2016-05-26 DIAGNOSIS — E039 Hypothyroidism, unspecified: Secondary | ICD-10-CM | POA: Diagnosis not present

## 2016-05-26 DIAGNOSIS — Z7984 Long term (current) use of oral hypoglycemic drugs: Secondary | ICD-10-CM | POA: Diagnosis not present

## 2016-05-26 DIAGNOSIS — D509 Iron deficiency anemia, unspecified: Secondary | ICD-10-CM | POA: Diagnosis not present

## 2016-05-26 DIAGNOSIS — K209 Esophagitis, unspecified without bleeding: Secondary | ICD-10-CM

## 2016-05-26 DIAGNOSIS — Z7982 Long term (current) use of aspirin: Secondary | ICD-10-CM | POA: Insufficient documentation

## 2016-05-26 DIAGNOSIS — K210 Gastro-esophageal reflux disease with esophagitis: Secondary | ICD-10-CM | POA: Insufficient documentation

## 2016-05-26 DIAGNOSIS — Z90710 Acquired absence of both cervix and uterus: Secondary | ICD-10-CM | POA: Insufficient documentation

## 2016-05-26 DIAGNOSIS — I2510 Atherosclerotic heart disease of native coronary artery without angina pectoris: Secondary | ICD-10-CM | POA: Insufficient documentation

## 2016-05-26 DIAGNOSIS — Z96659 Presence of unspecified artificial knee joint: Secondary | ICD-10-CM | POA: Diagnosis not present

## 2016-05-26 DIAGNOSIS — I10 Essential (primary) hypertension: Secondary | ICD-10-CM | POA: Insufficient documentation

## 2016-05-26 DIAGNOSIS — R933 Abnormal findings on diagnostic imaging of other parts of digestive tract: Secondary | ICD-10-CM | POA: Diagnosis not present

## 2016-05-26 DIAGNOSIS — K31811 Angiodysplasia of stomach and duodenum with bleeding: Secondary | ICD-10-CM | POA: Insufficient documentation

## 2016-05-26 DIAGNOSIS — Q2730 Arteriovenous malformation, site unspecified: Secondary | ICD-10-CM | POA: Diagnosis not present

## 2016-05-26 DIAGNOSIS — Z87891 Personal history of nicotine dependence: Secondary | ICD-10-CM | POA: Diagnosis not present

## 2016-05-26 DIAGNOSIS — K31819 Angiodysplasia of stomach and duodenum without bleeding: Secondary | ICD-10-CM | POA: Diagnosis not present

## 2016-05-26 DIAGNOSIS — J449 Chronic obstructive pulmonary disease, unspecified: Secondary | ICD-10-CM | POA: Insufficient documentation

## 2016-05-26 DIAGNOSIS — K449 Diaphragmatic hernia without obstruction or gangrene: Secondary | ICD-10-CM | POA: Diagnosis not present

## 2016-05-26 DIAGNOSIS — R198 Other specified symptoms and signs involving the digestive system and abdomen: Secondary | ICD-10-CM

## 2016-05-26 HISTORY — PX: ESOPHAGOGASTRODUODENOSCOPY (EGD) WITH PROPOFOL: SHX5813

## 2016-05-26 LAB — GLUCOSE, CAPILLARY: GLUCOSE-CAPILLARY: 195 mg/dL — AB (ref 65–99)

## 2016-05-26 SURGERY — ESOPHAGOGASTRODUODENOSCOPY (EGD) WITH PROPOFOL
Anesthesia: General

## 2016-05-26 MED ORDER — PHENYLEPHRINE HCL 10 MG/ML IJ SOLN
INTRAMUSCULAR | Status: DC | PRN
  Administered 2016-05-26 (×2): 100 ug via INTRAVENOUS

## 2016-05-26 MED ORDER — PROPOFOL 500 MG/50ML IV EMUL
INTRAVENOUS | Status: DC | PRN
  Administered 2016-05-26: 140 ug/kg/min via INTRAVENOUS

## 2016-05-26 MED ORDER — LIDOCAINE HCL (CARDIAC) 20 MG/ML IV SOLN
INTRAVENOUS | Status: DC | PRN
  Administered 2016-05-26: 100 mg via INTRAVENOUS

## 2016-05-26 MED ORDER — SODIUM CHLORIDE 0.9 % IV SOLN
INTRAVENOUS | Status: DC
  Administered 2016-05-26: 1000 mL via INTRAVENOUS

## 2016-05-26 MED ORDER — FENTANYL CITRATE (PF) 100 MCG/2ML IJ SOLN
INTRAMUSCULAR | Status: DC | PRN
  Administered 2016-05-26: 50 ug via INTRAVENOUS

## 2016-05-26 MED ORDER — GLYCOPYRROLATE 0.2 MG/ML IJ SOLN
INTRAMUSCULAR | Status: DC | PRN
  Administered 2016-05-26: 0.2 mg via INTRAVENOUS

## 2016-05-26 MED ORDER — PROPOFOL 10 MG/ML IV BOLUS
INTRAVENOUS | Status: DC | PRN
  Administered 2016-05-26 (×2): 10 mg via INTRAVENOUS
  Administered 2016-05-26: 60 mg via INTRAVENOUS
  Administered 2016-05-26: 10 mg via INTRAVENOUS

## 2016-05-26 NOTE — Anesthesia Procedure Notes (Signed)
Performed by: Princetta Uplinger Pre-anesthesia Checklist: Patient identified, Emergency Drugs available, Suction available, Patient being monitored and Timeout performed Patient Re-evaluated:Patient Re-evaluated prior to inductionOxygen Delivery Method: Nasal cannula Intubation Type: IV induction       

## 2016-05-26 NOTE — Op Note (Signed)
[facility] Gastroenterology Patient Name: Leslie Olsen Procedure Date: 05/26/2016 8:26 AM MRN: [id number] Account #: [id number] Date of Birth: 12-07-1950 Admit Type: Outpatient Age: 65 Room: [facility] ENDO ROOM 4 Gender: Female Note Status: Finalized Procedure:            Upper GI endoscopy Indications:          Abnormal video capsule endoscopy, Iron deficiency anemia Providers:            Lucilla Lame MD, MD Referring MD:         Valerie Roys (Referring MD) Medicines:            Propofol per Anesthesia Complications:        No immediate complications. Procedure:            Pre-Anesthesia Assessment:                       - Prior to the procedure, a History and Physical was                        performed, and patient medications and allergies were                        reviewed. The patient's tolerance of previous                        anesthesia was also reviewed. The risks and benefits of                        the procedure and the sedation options and risks were                        discussed with the patient. All questions were                        answered, and informed consent was obtained. Prior                        Anticoagulants: The patient has taken no previous                        anticoagulant or antiplatelet agents. ASA Grade                        Assessment: II - A patient with mild systemic disease.                        After reviewing the risks and benefits, the patient was                        deemed in satisfactory condition to undergo the                        procedure.                       After obtaining informed consent, the endoscope was                        passed under direct vision. Throughout the procedure,  the patient's blood pressure, pulse, and oxygen                        saturations were monitored continuously. The                        Colonoscope was introduced through the mouth,  and                        advanced to the jejunum. The upper GI endoscopy was                        accomplished without difficulty. The patient tolerated                        the procedure well. Findings:      Multiple angiodysplastic lesions with bleeding were found in the       proximal jejunum. Coagulation for tissue destruction using argon beam at       2 liters/minute and 30 watts was successful.      Multiple angiodysplastic lesions with bleeding were found in the entire       duodenum. Coagulation for tissue destruction using argon beam at 2       liters/minute and 30 watts was successful.      The entire examined stomach was normal.      A medium-sized hiatal hernia was present.      LA Grade A (one or more mucosal breaks less than 5 mm, not extending       between tops of 2 mucosal folds) esophagitis with no bleeding was found       at the gastroesophageal junction. Biopsies were taken with a cold       forceps for histology. Impression:           - Multiple bleeding angiodysplastic lesions in the                        duodenum. Treated with argon beam coagulation.                       - Multiple bleeding angiodysplastic lesions in the                        duodenum. Treated with argon beam coagulation.                       - Normal stomach.                       - Medium-sized hiatal hernia.                       - LA Grade A esophagitis. Biopsied. Recommendation:       - Discharge patient to home.                       - Resume previous diet.                       - Continue present medications. Procedure Code(s):    --- Professional ---  43255, 59, Esophagogastroduodenoscopy, flexible,                        transoral; with control of bleeding, any method                       43239, Esophagogastroduodenoscopy, flexible, transoral;                        with biopsy, single or multiple Diagnosis Code(s):    --- Professional ---                        D50.9, Iron deficiency anemia, unspecified                       R93.3, Abnormal findings on diagnostic imaging of other                        parts of digestive tract                       K20.9, Esophagitis, unspecified                       K31.811, Angiodysplasia of stomach and duodenum with                        bleeding CPT copyright 2016 American Medical Association. All rights reserved. The codes documented in this report are preliminary and upon coder review may  be revised to meet current compliance requirements. Lucilla Lame MD, MD 05/26/2016 8:57:00 AM This report has been signed electronically. Number of Addenda: 0 Note Initiated On: 05/26/2016 8:26 AM      [facility]

## 2016-05-26 NOTE — Anesthesia Postprocedure Evaluation (Signed)
Anesthesia Post Note  Patient: Leslie Olsen  Procedure(s) Performed: Procedure(s) (LRB): ESOPHAGOGASTRODUODENOSCOPY (EGD) WITH PROPOFOL (N/A)  Patient location during evaluation: Endoscopy Anesthesia Type: General Level of consciousness: awake and alert Pain management: pain level controlled Vital Signs Assessment: post-procedure vital signs reviewed and stable Respiratory status: spontaneous breathing and respiratory function stable Cardiovascular status: stable Anesthetic complications: no    Last Vitals:  Vitals:   05/26/16 0753 05/26/16 0859  BP: (!) 146/94 (!) 98/59  Pulse: 86 84  Resp: 16 20  Temp: 37.1 C 36.3 C    Last Pain:  Vitals:   05/26/16 0859  TempSrc: Axillary  PainSc:                  KEPHART,WILLIAM K

## 2016-05-26 NOTE — Transfer of Care (Signed)
Immediate Anesthesia Transfer of Care Note  Patient: Leslie Olsen  Procedure(s) Performed: Procedure(s): ESOPHAGOGASTRODUODENOSCOPY (EGD) WITH PROPOFOL (N/A)  Patient Location: PACU  Anesthesia Type:General  Level of Consciousness: awake and alert   Airway & Oxygen Therapy: Patient Spontanous Breathing and Patient connected to nasal cannula oxygen  Post-op Assessment: Report given to RN and Post -op Vital signs reviewed and stable  Post vital signs: Reviewed and stable  Last Vitals:  Vitals:   05/26/16 0753  BP: (!) 146/94  Pulse: 86  Resp: 16  Temp: 37.1 C    Last Pain:  Vitals:   05/26/16 0753  TempSrc: Tympanic  PainSc: 1          Complications: No apparent anesthesia complications

## 2016-05-26 NOTE — H&P (Signed)
Lucilla Lame, MD [facility] [address] [place], [place] [address] Phone: [phone] Fax : [phone]  Primary Care Physician:  Park Liter, DO Primary Gastroenterologist:  Dr. Allen Norris  Pre-Procedure History & Physical: HPI:  Leslie Olsen is a 66 y.o. female is here for an endoscopy.   Past Medical History:  Diagnosis Date  . Anemia   . Chronic headaches   . COPD (chronic obstructive pulmonary disease) ([place])   . Diabetes mellitus without complication ([place])   . GERD (gastroesophageal reflux disease)   . Hypertension   . Hypothyroidism     Past Surgical History:  Procedure Laterality Date  . ABDOMINAL HYSTERECTOMY    . BACK SURGERY    . CHOLECYSTECTOMY    . CORONARY STENT PLACEMENT    . GIVENS CAPSULE STUDY N/A 05/05/2016   Procedure: GIVENS CAPSULE STUDY;  Surgeon: Lucilla Lame, MD;  Location: [facility] ENDOSCOPY;  Service: Endoscopy;  Laterality: N/A;  . REDUCTION MAMMAPLASTY    . REPLACEMENT TOTAL KNEE    . SHOULDER SURGERY    . TIBIA FRACTURE SURGERY      Prior to Admission medications   Medication Sig Start Date End Date Taking? Authorizing Provider  albuterol (PROVENTIL HFA;VENTOLIN HFA) 108 (90 Base) MCG/ACT inhaler Inhale 2 puffs into the lungs every 6 (six) hours as needed for wheezing. 02/21/16  Yes Megan P Johnson, DO  aspirin 81 MG tablet Take 81 mg by mouth daily.   Yes Historical Provider, MD  budesonide-formoterol (SYMBICORT) 160-4.5 MCG/ACT inhaler Inhale 1 puff into the lungs 2 (two) times daily. 11/27/15  Yes Vishal Mungal, MD  ferrous fumarate (HEMOCYTE - 106 MG FE) 325 (106 FE) MG TABS tablet Take 1 tablet by mouth daily.   Yes Historical Provider, MD  gabapentin (NEURONTIN) 300 MG capsule Take 1 capsule (300 mg total) by mouth 3 (three) times daily. 02/21/16  Yes Megan P Johnson, DO  glucose blood (COOL BLOOD GLUCOSE TEST STRIPS) test strip Use as instructed 07/17/15  Yes Megan P Johnson, DO  levothyroxine (SYNTHROID, LEVOTHROID) 75 MCG tablet Take 1  tablet (75 mcg total) by mouth daily. Brand name required 02/24/16  Yes Megan P Johnson, DO  metFORMIN (GLUCOPHAGE) 500 MG tablet Take 1 tablet (500 mg total) by mouth 2 (two) times daily with a meal. 02/21/16  Yes Megan P Johnson, DO  nortriptyline (PAMELOR) 25 MG capsule Take 1 capsule (25 mg total) by mouth at bedtime. 02/21/16  Yes Megan P Johnson, DO  Omega-3 Fatty Acids (FISH OIL) 1200 MG CAPS Take 1 capsule by mouth daily.   Yes Historical Provider, MD  rosuvastatin (CRESTOR) 40 MG tablet Take 1 tablet (40 mg total) by mouth daily. 03/20/16  Yes Megan P Johnson, DO  SUMAtriptan (IMITREX) 100 MG tablet Take 100 mg by mouth every 2 (two) hours as needed for migraine. May repeat in 2 hours if headache persists or recurs.   Yes Historical Provider, MD  lisinopril (PRINIVIL,ZESTRIL) 5 MG tablet Take 1 tablet (5 mg total) by mouth daily. 02/21/16   Megan P Johnson, DO    Allergies as of 05/19/2016  . (No Known Allergies)    Family History  Problem Relation Age of Onset  . Lung cancer Maternal Aunt   . Breast cancer Maternal Aunt 57  . Other Mother     Killed in car accident, 50  . Other Father     Killed in car accident, 52  . Thyroid disease Brother   . Thyroid disease Sister  Social History   Social History  . Marital status: Divorced    Spouse name: N/A  . Number of children: N/A  . Years of education: N/A   Occupational History  . Not on file.   Social History Main Topics  . Smoking status: Former Smoker    Packs/day: 2.00    Years: 47.00    Types: Cigarettes    Quit date: 11/06/2011  . Smokeless tobacco: Never Used  . Alcohol use No  . Drug use: No  . Sexual activity: Not on file   Other Topics Concern  . Not on file   Social History Narrative   Lives alone.  She has one grown son.   She works as a Retail buyer at [employer]   Highest level of education was high school    Review of Systems: See HPI, otherwise negative ROS  Physical Exam: BP (!) 146/94    Pulse 86   Temp 98.7 F (37.1 C) (Tympanic)   Resp 16   Ht 5\' 6"  (1.676 m)   Wt 202 lb (91.6 kg)   SpO2 100%   BMI 32.60 kg/m  General:   Alert,  pleasant and cooperative in NAD Head:  Normocephalic and atraumatic. Neck:  Supple; no masses or thyromegaly. Lungs:  Clear throughout to auscultation.    Heart:  Regular rate and rhythm. Abdomen:  Soft, nontender and nondistended. Normal bowel sounds, without guarding, and without rebound.   Neurologic:  Alert and  oriented x4;  grossly normal neurologically.  Impression/Plan: Leslie Olsen is here for an EGD to be performed for AVM's Risks, benefits, limitations, and alternatives regarding  endoscopy have been reviewed with the patient.  Questions have been answered.  All parties agreeable.   Lucilla Lame, MD  05/26/2016, 8:18 AM

## 2016-05-26 NOTE — Anesthesia Preprocedure Evaluation (Signed)
Anesthesia Evaluation  Patient identified by MRN, date of birth, ID band Patient awake    Reviewed: Allergy & Precautions, NPO status , Patient's Chart, lab work & pertinent test results  History of Anesthesia Complications Negative for: history of anesthetic complications  Airway Mallampati: III       Dental  (+) Upper Dentures, Lower Dentures   Pulmonary shortness of breath, sleep apnea and Continuous Positive Airway Pressure Ventilation , COPD,  COPD inhaler, former smoker,           Cardiovascular hypertension, Pt. on medications + CAD       Neuro/Psych    GI/Hepatic GERD  Medicated,(+) Hepatitis -, Unspecified  Endo/Other  diabetes, Type 2, Oral Hypoglycemic AgentsHypothyroidism   Renal/GU negative Renal ROS     Musculoskeletal   Abdominal   Peds  Hematology  (+) anemia ,   Anesthesia Other Findings   Reproductive/Obstetrics                             Anesthesia Physical Anesthesia Plan  ASA: III  Anesthesia Plan: General   Post-op Pain Management:    Induction: Intravenous  Airway Management Planned: Nasal Cannula  Additional Equipment:   Intra-op Plan:   Post-operative Plan:   Informed Consent: I have reviewed the patients History and Physical, chart, labs and discussed the procedure including the risks, benefits and alternatives for the proposed anesthesia with the patient or authorized representative who has indicated his/her understanding and acceptance.     Plan Discussed with:   Anesthesia Plan Comments:         Anesthesia Quick Evaluation

## 2016-05-27 ENCOUNTER — Encounter: Payer: Self-pay | Admitting: Gastroenterology

## 2016-05-27 LAB — SURGICAL PATHOLOGY

## 2016-05-28 ENCOUNTER — Encounter: Payer: Self-pay | Admitting: Gastroenterology

## 2016-06-02 ENCOUNTER — Encounter: Payer: Self-pay | Admitting: Family Medicine

## 2016-06-02 ENCOUNTER — Ambulatory Visit (INDEPENDENT_AMBULATORY_CARE_PROVIDER_SITE_OTHER): Payer: PPO | Admitting: Family Medicine

## 2016-06-02 ENCOUNTER — Other Ambulatory Visit: Payer: Self-pay

## 2016-06-02 VITALS — BP 127/78 | HR 100 | Temp 98.4°F | Wt 204.0 lb

## 2016-06-02 DIAGNOSIS — E119 Type 2 diabetes mellitus without complications: Secondary | ICD-10-CM

## 2016-06-02 DIAGNOSIS — Z23 Encounter for immunization: Secondary | ICD-10-CM | POA: Diagnosis not present

## 2016-06-02 DIAGNOSIS — J383 Other diseases of vocal cords: Secondary | ICD-10-CM | POA: Diagnosis not present

## 2016-06-02 LAB — BAYER DCA HB A1C WAIVED: HB A1C: 7.4 % — AB (ref <7.0)

## 2016-06-02 MED ORDER — METFORMIN HCL 500 MG PO TABS: 500.0000 mg | ORAL_TABLET | Freq: Two times a day (BID) | ORAL | 1 refills | Status: DC

## 2016-06-02 MED ORDER — BUDESONIDE-FORMOTEROL FUMARATE 160-4.5 MCG/ACT IN AERO: 1.0000 | INHALATION_SPRAY | Freq: Two times a day (BID) | RESPIRATORY_TRACT | 5 refills | Status: DC

## 2016-06-02 MED ORDER — GABAPENTIN 300 MG PO CAPS: 300.0000 mg | ORAL_CAPSULE | Freq: Three times a day (TID) | ORAL | 3 refills | Status: DC

## 2016-06-02 MED ORDER — ROSUVASTATIN CALCIUM 40 MG PO TABS: 40.0000 mg | ORAL_TABLET | Freq: Every day | ORAL | 3 refills | Status: DC

## 2016-06-02 MED ORDER — ALBUTEROL SULFATE HFA 108 (90 BASE) MCG/ACT IN AERS: 2.0000 | INHALATION_SPRAY | Freq: Four times a day (QID) | RESPIRATORY_TRACT | 5 refills | Status: DC | PRN

## 2016-06-02 MED ORDER — LISINOPRIL 5 MG PO TABS: 5.0000 mg | ORAL_TABLET | Freq: Every day | ORAL | 1 refills | Status: DC

## 2016-06-02 NOTE — Patient Instructions (Signed)

## 2016-06-02 NOTE — Progress Notes (Signed)
BP 127/78 (BP Location: Right Arm, Patient Position: Sitting, Cuff Size: Normal)   Pulse 100   Temp 98.4 F (36.9 C)   Wt 204 lb (92.5 kg)   SpO2 97%   BMI 32.93 kg/m    Subjective:    Patient ID: Leslie Olsen, female    DOB: Sep 06, 1951, 65 y.o.   MRN: [id number]  HPI: Leslie Olsen is a 65 y.o. female  Chief Complaint  Patient presents with  . Diabetes   DIABETES- has not been caring much about her DM because she has been so frustrated Hypoglycemic episodes:no Polydipsia/polyuria: no Visual disturbance: no Chest pain: no Paresthesias: no Glucose Monitoring: yes  Accucheck frequency: Daily Taking Insulin?: yes Blood Pressure Monitoring: not checking Retinal Examination: Up to Date Foot Exam: Up to Date Diabetic Education: Completed Pneumovax: Up to Date Influenza: Up to Date Aspirin: yes  Relevant past medical, surgical, family and social history reviewed and updated as indicated. Interim medical history since our last visit reviewed. Allergies and medications reviewed and updated.  Review of Systems  Constitutional: Negative.   Respiratory: Negative.   Cardiovascular: Negative.   Psychiatric/Behavioral: Negative.     Per HPI unless specifically indicated above     Objective:    BP 127/78 (BP Location: Right Arm, Patient Position: Sitting, Cuff Size: Normal)   Pulse 100   Temp 98.4 F (36.9 C)   Wt 204 lb (92.5 kg)   SpO2 97%   BMI 32.93 kg/m   Wt Readings from Last 3 Encounters:  06/02/16 204 lb (92.5 kg)  05/26/16 202 lb (91.6 kg)  05/01/16 204 lb (92.5 kg)    Physical Exam  Constitutional: She is oriented to person, place, and time. She appears well-developed and well-nourished. No distress.  HENT:  Head: Normocephalic and atraumatic.  Right Ear: Hearing normal.  Left Ear: Hearing normal.  Nose: Nose normal.  Eyes: Conjunctivae and lids are normal. Right eye exhibits no discharge. Left eye exhibits no discharge. No scleral icterus.    Cardiovascular: Normal rate, regular rhythm, normal heart sounds and intact distal pulses.  Exam reveals no gallop and no friction rub.   No murmur heard. Pulmonary/Chest: Effort normal and breath sounds normal. No respiratory distress. She has no wheezes. She has no rales. She exhibits no tenderness.  Musculoskeletal: Normal range of motion.  Neurological: She is alert and oriented to person, place, and time.  Skin: Skin is warm, dry and intact. No rash noted. She is not diaphoretic. No erythema. No pallor.  Psychiatric: She has a normal mood and affect. Her speech is normal and behavior is normal. Judgment and thought content normal. Cognition and memory are normal.  Nursing note and vitals reviewed.     Assessment & Plan:   Problem List Items Addressed This Visit      Endocrine   Diabetes mellitus without complication ([place]) - Primary    Not going the right way. Will really work on diet and recheck 3 months.       Relevant Medications   rosuvastatin (CRESTOR) 40 MG tablet   lisinopril (PRINIVIL,ZESTRIL) 5 MG tablet   metFORMIN (GLUCOPHAGE) 500 MG tablet   Other Relevant Orders   Bayer DCA Hb A1c Waived     Nervous and Auditory   Dysphonia spastica    Continue to follow with neurology/vocal specialist. Call with any concerns.        Other Visit Diagnoses    Immunization due       Relevant Orders  Flu vaccine HIGH DOSE PF (Fluzone High dose) (Completed)       Follow up plan: Return in about 3 months (around 09/01/2016) for Wellness.

## 2016-06-02 NOTE — Assessment & Plan Note (Signed)
Continue to follow with neurology/vocal specialist. Call with any concerns.

## 2016-06-02 NOTE — Assessment & Plan Note (Signed)
Not going the right way. Will really work on diet and recheck 3 months.

## 2016-06-17 DIAGNOSIS — R490 Dysphonia: Secondary | ICD-10-CM | POA: Diagnosis not present

## 2016-06-17 DIAGNOSIS — R066 Hiccough: Secondary | ICD-10-CM | POA: Diagnosis not present

## 2016-06-17 DIAGNOSIS — G248 Other dystonia: Secondary | ICD-10-CM | POA: Diagnosis not present

## 2016-06-17 DIAGNOSIS — Z6835 Body mass index (BMI) 35.0-35.9, adult: Secondary | ICD-10-CM | POA: Diagnosis not present

## 2016-07-03 DIAGNOSIS — G248 Other dystonia: Secondary | ICD-10-CM | POA: Diagnosis not present

## 2016-07-03 DIAGNOSIS — R05 Cough: Secondary | ICD-10-CM | POA: Diagnosis not present

## 2016-07-03 DIAGNOSIS — R490 Dysphonia: Secondary | ICD-10-CM | POA: Diagnosis not present

## 2016-07-10 DIAGNOSIS — R490 Dysphonia: Secondary | ICD-10-CM | POA: Diagnosis not present

## 2016-07-10 DIAGNOSIS — G248 Other dystonia: Secondary | ICD-10-CM | POA: Diagnosis not present

## 2016-07-10 DIAGNOSIS — M47812 Spondylosis without myelopathy or radiculopathy, cervical region: Secondary | ICD-10-CM | POA: Diagnosis not present

## 2016-07-10 DIAGNOSIS — M4802 Spinal stenosis, cervical region: Secondary | ICD-10-CM | POA: Diagnosis not present

## 2016-07-14 ENCOUNTER — Inpatient Hospital Stay: Payer: PPO | Attending: Hematology and Oncology

## 2016-07-14 DIAGNOSIS — D509 Iron deficiency anemia, unspecified: Secondary | ICD-10-CM

## 2016-07-14 LAB — CBC WITH DIFFERENTIAL/PLATELET
Basophils Absolute: 0.1 10*3/uL (ref 0–0.1)
Basophils Relative: 1 %
Eosinophils Absolute: 0.2 10*3/uL (ref 0–0.7)
Eosinophils Relative: 3 %
HCT: 37.4 % (ref 35.0–47.0)
Hemoglobin: 13 g/dL (ref 12.0–16.0)
Lymphocytes Relative: 28 %
Lymphs Abs: 1.7 10*3/uL (ref 1.0–3.6)
MCH: 31.2 pg (ref 26.0–34.0)
MCHC: 34.7 g/dL (ref 32.0–36.0)
MCV: 89.9 fL (ref 80.0–100.0)
Monocytes Absolute: 0.5 10*3/uL (ref 0.2–0.9)
Monocytes Relative: 8 %
Neutro Abs: 3.7 10*3/uL (ref 1.4–6.5)
Neutrophils Relative %: 60 %
Platelets: 260 10*3/uL (ref 150–440)
RBC: 4.17 MIL/uL (ref 3.80–5.20)
RDW: 12.8 % (ref 11.5–14.5)
WBC: 6.2 10*3/uL (ref 3.6–11.0)

## 2016-07-14 LAB — FERRITIN: Ferritin: 17 ng/mL (ref 11–307)

## 2016-07-15 ENCOUNTER — Telehealth: Payer: Self-pay | Admitting: Hematology and Oncology

## 2016-07-15 NOTE — Telephone Encounter (Signed)
She would like nurse to please call with her lab results. Thanks.

## 2016-07-16 NOTE — Telephone Encounter (Signed)
Called patient to review lab results.  Ferritin is 17 which is very close to what it was the last time patient had labs.  Patient is not anemic.  MD recommends keeping follow up lab appointment in February.  Advised to call back in the meantime if she becomes symptomatic.  Patient stated to me that she saw Dr. Allen Norris about 5 weeks ago, who "fixed some places that had been bleeding, and she hopes the ferritin will improve after having the procedure".

## 2016-07-20 ENCOUNTER — Encounter: Payer: Self-pay | Admitting: Family Medicine

## 2016-07-22 DIAGNOSIS — G248 Other dystonia: Secondary | ICD-10-CM | POA: Diagnosis not present

## 2016-07-22 DIAGNOSIS — K3184 Gastroparesis: Secondary | ICD-10-CM | POA: Diagnosis not present

## 2016-07-22 DIAGNOSIS — R490 Dysphonia: Secondary | ICD-10-CM | POA: Diagnosis not present

## 2016-07-22 DIAGNOSIS — Q2733 Arteriovenous malformation of digestive system vessel: Secondary | ICD-10-CM | POA: Diagnosis not present

## 2016-07-22 DIAGNOSIS — Z6835 Body mass index (BMI) 35.0-35.9, adult: Secondary | ICD-10-CM | POA: Diagnosis not present

## 2016-07-23 ENCOUNTER — Ambulatory Visit (INDEPENDENT_AMBULATORY_CARE_PROVIDER_SITE_OTHER): Payer: PPO | Admitting: Family Medicine

## 2016-07-23 ENCOUNTER — Other Ambulatory Visit: Payer: PPO

## 2016-07-23 ENCOUNTER — Encounter: Payer: Self-pay | Admitting: Family Medicine

## 2016-07-23 VITALS — BP 148/85 | HR 108 | Temp 98.1°F | Wt 208.3 lb

## 2016-07-23 DIAGNOSIS — R5383 Other fatigue: Secondary | ICD-10-CM | POA: Diagnosis not present

## 2016-07-23 NOTE — Progress Notes (Signed)
BP (!) 148/85 (BP Location: Left Arm, Patient Position: Sitting, Cuff Size: Large)   Pulse (!) 108   Temp 98.1 F (36.7 C)   Wt 208 lb 4.8 oz (94.5 kg)   SpO2 97%   BMI 33.62 kg/m    Subjective:    Patient ID: Leslie Olsen, female    DOB: [birth year], 65 y.o.   MRN: [id number]  HPI: Leslie Olsen is a 65 y.o. female  Chief Complaint  Patient presents with  . Doesn't feel right   Not feeling well. Has been having pain in her belly. Has not been doing any better with it. Continuing to see the movement specialist. She is very frustrated with the tests and that she is not better.   FATIGUE Duration:  chronic Severity: severe  Onset: gradual Context when symptoms started:  unknown Symptoms improve with rest: no  Depressive symptoms: no Stress/anxiety: yes Insomnia: no  Snoring: no Observed apnea by bed partner: no Daytime hypersomnolence:no Wakes feeling refreshed: no History of sleep study: yes Dysnea on exertion:  no Orthopnea/PND: no Chest pain: no Chronic cough: no Lower extremity edema: no Arthralgias:yes Myalgias: yes Weakness: no Rash: no  Relevant past medical, surgical, family and social history reviewed and updated as indicated. Interim medical history since our last visit reviewed. Allergies and medications reviewed and updated.  Review of Systems  Constitutional: Negative.   Respiratory: Negative.   Cardiovascular: Negative.   Musculoskeletal: Positive for arthralgias and myalgias. Negative for back pain, gait problem, joint swelling, neck pain and neck stiffness.  Psychiatric/Behavioral: Negative.     Per HPI unless specifically indicated above     Objective:    BP (!) 148/85 (BP Location: Left Arm, Patient Position: Sitting, Cuff Size: Large)   Pulse (!) 108   Temp 98.1 F (36.7 C)   Wt 208 lb 4.8 oz (94.5 kg)   SpO2 97%   BMI 33.62 kg/m   Wt Readings from Last 3 Encounters:  07/23/16 208 lb 4.8 oz (94.5 kg)  06/02/16 204 lb  (92.5 kg)  05/26/16 202 lb (91.6 kg)    Physical Exam  Constitutional: She is oriented to person, place, and time. She appears well-developed and well-nourished. No distress.  HENT:  Head: Normocephalic and atraumatic.  Right Ear: Hearing normal.  Left Ear: Hearing normal.  Nose: Nose normal.  Eyes: Conjunctivae and lids are normal. Right eye exhibits no discharge. Left eye exhibits no discharge. No scleral icterus.  Cardiovascular: Normal rate, regular rhythm, normal heart sounds and intact distal pulses.  Exam reveals no gallop and no friction rub.   No murmur heard. Pulmonary/Chest: Effort normal and breath sounds normal. No respiratory distress. She has no wheezes. She has no rales. She exhibits no tenderness.  Musculoskeletal: Normal range of motion.  Neurological: She is alert and oriented to person, place, and time.  Skin: Skin is warm, dry and intact. No rash noted. No erythema. No pallor.  Psychiatric: She has a normal mood and affect. Her speech is normal and behavior is normal. Judgment and thought content normal. Cognition and memory are normal.  Nursing note and vitals reviewed.   Results for orders placed or performed in visit on 07/14/16  CBC with Differential/Platelet  Result Value Ref Range   WBC 6.2 3.6 - 11.0 K/uL   RBC 4.17 3.80 - 5.20 MIL/uL   Hemoglobin 13.0 12.0 - 16.0 g/dL   HCT 37.4 35.0 - 47.0 %   MCV 89.9 80.0 - 100.0 fL  MCH 31.2 26.0 - 34.0 pg   MCHC 34.7 32.0 - 36.0 g/dL   RDW 12.8 11.5 - 14.5 %   Platelets 260 150 - 440 K/uL   Neutrophils Relative % 60 %   Neutro Abs 3.7 1.4 - 6.5 K/uL   Lymphocytes Relative 28 %   Lymphs Abs 1.7 1.0 - 3.6 K/uL   Monocytes Relative 8 %   Monocytes Absolute 0.5 0.2 - 0.9 K/uL   Eosinophils Relative 3 %   Eosinophils Absolute 0.2 0 - 0.7 K/uL   Basophils Relative 1 %   Basophils Absolute 0.1 0 - 0.1 K/uL  Ferritin  Result Value Ref Range   Ferritin 17 11 - 307 ng/mL      Assessment & Plan:   Problem List  Items Addressed This Visit    None    Visit Diagnoses    Fatigue, unspecified type    -  Primary   No better. Will check labs including tick diseases. To see GI at [facility] for gastroparesis. Await results.   Relevant Orders   TSH   B12 and Folate Panel   Lyme Ab/Western Blot Reflex   Rocky mtn spotted fvr abs pnl(IgG+IgM)   Ehrlichia Antibody Panel   Babesia microti Antibody Panel       Follow up plan: Return As scheduled.

## 2016-07-24 ENCOUNTER — Telehealth: Payer: Self-pay | Admitting: Family Medicine

## 2016-07-24 DIAGNOSIS — E039 Hypothyroidism, unspecified: Secondary | ICD-10-CM

## 2016-07-24 MED ORDER — LEVOTHYROXINE SODIUM 88 MCG PO TABS: 88.0000 ug | ORAL_TABLET | Freq: Every day | ORAL | 2 refills | Status: DC

## 2016-07-24 NOTE — Telephone Encounter (Signed)
Called to let Solanch know that her thyroid was under active. Will increase to 34mcg and recheck 6 weeks.

## 2016-07-25 LAB — EHRLICHIA ANTIBODY PANEL
E. CHAFFEENSIS IGG AB: NEGATIVE
E. Chaffeensis (HME) IgM Titer: NEGATIVE
HGE IGM TITER: NEGATIVE
HGE IgG Titer: NEGATIVE

## 2016-07-25 LAB — LYME AB/WESTERN BLOT REFLEX: Lyme IgG/IgM Ab: <0.91 {ISR} (ref 0.00–0.90)

## 2016-07-25 LAB — B12 AND FOLATE PANEL
Folate: 10.3 ng/mL (ref >3.0)
Vitamin B-12: 300 pg/mL (ref 211–946)

## 2016-07-25 LAB — BABESIA MICROTI ANTIBODY PANEL
Babesia microti IgG: <1:10 {titer}
Babesia microti IgM: <1:10 {titer}

## 2016-07-25 LAB — ROCKY MTN SPOTTED FVR ABS PNL(IGG+IGM)
RMSF IgG: NEGATIVE
RMSF IgM: 1.71 index — ABNORMAL HIGH (ref 0.00–0.89)

## 2016-07-25 LAB — TSH: TSH: 5.14 u[IU]/mL — AB (ref 0.450–4.500)

## 2016-07-27 ENCOUNTER — Telehealth: Payer: Self-pay | Admitting: Family Medicine

## 2016-07-27 DIAGNOSIS — A770 Spotted fever due to Rickettsia rickettsii: Secondary | ICD-10-CM

## 2016-07-27 HISTORY — DX: Spotted fever due to Rickettsia rickettsii: A77.0

## 2016-07-27 MED ORDER — DOXYCYCLINE HYCLATE 100 MG PO TABS: 100.0000 mg | ORAL_TABLET | Freq: Two times a day (BID) | ORAL | 0 refills | Status: DC

## 2016-07-27 NOTE — Telephone Encounter (Signed)
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 She will start doxycycline. Call with any concerns.

## 2016-09-01 ENCOUNTER — Encounter: Payer: PPO | Admitting: Family Medicine

## 2016-09-02 ENCOUNTER — Other Ambulatory Visit: Payer: Self-pay | Admitting: Family Medicine

## 2016-09-02 DIAGNOSIS — Z1231 Encounter for screening mammogram for malignant neoplasm of breast: Secondary | ICD-10-CM

## 2016-09-03 ENCOUNTER — Encounter: Payer: Self-pay | Admitting: Family Medicine

## 2016-09-03 ENCOUNTER — Ambulatory Visit (INDEPENDENT_AMBULATORY_CARE_PROVIDER_SITE_OTHER): Payer: PPO | Admitting: Family Medicine

## 2016-09-03 VITALS — BP 138/80 | HR 88 | Temp 98.2°F | Ht 65.6 in | Wt 211.8 lb

## 2016-09-03 DIAGNOSIS — E039 Hypothyroidism, unspecified: Secondary | ICD-10-CM

## 2016-09-03 DIAGNOSIS — J383 Other diseases of vocal cords: Secondary | ICD-10-CM | POA: Diagnosis not present

## 2016-09-03 DIAGNOSIS — D500 Iron deficiency anemia secondary to blood loss (chronic): Secondary | ICD-10-CM

## 2016-09-03 DIAGNOSIS — R5383 Other fatigue: Secondary | ICD-10-CM

## 2016-09-03 DIAGNOSIS — I2510 Atherosclerotic heart disease of native coronary artery without angina pectoris: Secondary | ICD-10-CM | POA: Diagnosis not present

## 2016-09-03 DIAGNOSIS — Z1231 Encounter for screening mammogram for malignant neoplasm of breast: Secondary | ICD-10-CM

## 2016-09-03 DIAGNOSIS — J438 Other emphysema: Secondary | ICD-10-CM | POA: Diagnosis not present

## 2016-09-03 DIAGNOSIS — E782 Mixed hyperlipidemia: Secondary | ICD-10-CM

## 2016-09-03 DIAGNOSIS — I10 Essential (primary) hypertension: Secondary | ICD-10-CM | POA: Diagnosis not present

## 2016-09-03 DIAGNOSIS — Z0000 Encounter for general adult medical examination without abnormal findings: Secondary | ICD-10-CM | POA: Diagnosis not present

## 2016-09-03 DIAGNOSIS — Z87891 Personal history of nicotine dependence: Secondary | ICD-10-CM | POA: Diagnosis not present

## 2016-09-03 DIAGNOSIS — Z1239 Encounter for other screening for malignant neoplasm of breast: Secondary | ICD-10-CM

## 2016-09-03 DIAGNOSIS — E119 Type 2 diabetes mellitus without complications: Secondary | ICD-10-CM | POA: Diagnosis not present

## 2016-09-03 LAB — CBC WITH DIFFERENTIAL/PLATELET
HEMATOCRIT: 37.5 % (ref 34.0–46.6)
HEMOGLOBIN: 12.6 g/dL (ref 11.1–15.9)
LYMPHS ABS: 1.8 10*3/uL (ref 0.7–3.1)
Lymphs: 33 %
MCH: 30.7 pg (ref 26.6–33.0)
MCHC: 33.6 g/dL (ref 31.5–35.7)
MCV: 92 fL (ref 79–97)
MID (ABSOLUTE): 0.6 10*3/uL (ref 0.1–1.6)
MID: 10 %
Neutrophils Absolute: 3.2 10*3/uL (ref 1.4–7.0)
Neutrophils: 57 %
Platelets: 296 10*3/uL (ref 150–379)
RBC: 4.1 x10E6/uL (ref 3.77–5.28)
RDW: 13.5 % (ref 12.3–15.4)
WBC: 5.6 10*3/uL (ref 3.4–10.8)

## 2016-09-03 LAB — MICROSCOPIC EXAMINATION: Bacteria, UA: NONE SEEN

## 2016-09-03 LAB — UA/M W/RFLX CULTURE, ROUTINE
Bilirubin, UA: NEGATIVE
Glucose, UA: NEGATIVE
KETONES UA: NEGATIVE
Leukocytes, UA: NEGATIVE
NITRITE UA: NEGATIVE
Protein, UA: NEGATIVE
Specific Gravity, UA: 1.015 (ref 1.005–1.030)
UUROB: 0.2 mg/dL (ref 0.2–1.0)
pH, UA: 6 (ref 5.0–7.5)

## 2016-09-03 LAB — LIPID PANEL PICCOLO, WAIVED
CHOLESTEROL PICCOLO, WAIVED: 269 mg/dL — AB (ref <200)
Chol/HDL Ratio Piccolo,Waive: 5.1 mg/dL — ABNORMAL HIGH
HDL CHOL PICCOLO, WAIVED: 53 mg/dL — AB (ref >59)
LDL CHOL CALC PICCOLO WAIVED: 159 mg/dL — AB (ref <100)
TRIGLYCERIDES PICCOLO,WAIVED: 284 mg/dL — AB (ref <150)
VLDL CHOL CALC PICCOLO,WAIVE: 57 mg/dL — AB (ref <30)

## 2016-09-03 LAB — MICROALBUMIN, URINE WAIVED
Creatinine, Urine Waived: 50 mg/dL (ref 10–300)
Microalb, Ur Waived: 30 mg/L — ABNORMAL HIGH (ref 0–19)

## 2016-09-03 LAB — BAYER DCA HB A1C WAIVED: HB A1C (BAYER DCA - WAIVED): 7.9 % — ABNORMAL HIGH (ref <7.0)

## 2016-09-03 MED ORDER — METFORMIN HCL 500 MG PO TABS: 1000.0000 mg | ORAL_TABLET | Freq: Two times a day (BID) | ORAL | 1 refills | Status: DC

## 2016-09-03 MED ORDER — ROSUVASTATIN CALCIUM 40 MG PO TABS: 40.0000 mg | ORAL_TABLET | Freq: Every day | ORAL | 1 refills | Status: DC

## 2016-09-03 NOTE — Assessment & Plan Note (Signed)
Stable. Continue current regimen. Continue to monitor. Call with any concerns. Follow up with pulmonology as needed.

## 2016-09-03 NOTE — Assessment & Plan Note (Signed)
A1c up to 7.9. Will increase her metformin to 1000mg  BID and recheck 3 months.

## 2016-09-03 NOTE — Assessment & Plan Note (Signed)
Still elevated. May need to consider other medication options. Recheck 6 months- really work on diet and exercise. Continue current regimen.

## 2016-09-03 NOTE — Assessment & Plan Note (Signed)
Rechecking labs today. Will adjust dose as needed. Call with any concerns.  

## 2016-09-03 NOTE — Assessment & Plan Note (Signed)
Better on recheck. Continue current regimen. Continue to monitor. Call with any concerns.  

## 2016-09-03 NOTE — Patient Instructions (Addendum)
Preventative Services:  Health Risk Assessment and Personalized Prevention Plan: Done today Bone Mass Measurements: up to date Breast Cancer Screening: Ordered today CVD Screening: Done today Cervical Cancer Screening: N/A Colon Cancer Screening: Up to date Depression Screening: Done today Diabetes Screening: Done today Glaucoma Screening: See your eye doctor Hepatitis B vaccine: N/A Hepatitis C screening: up to date HIV Screening: up to date Flu Vaccine: up to date Lung cancer Screening: Ordered today Obesity Screening: Done today Pneumonia Vaccines (2): up to date STI Screening: N/AHealth Maintenance, Female Introduction Adopting a healthy lifestyle and getting preventive care can go a long way to promote health and wellness. Talk with your health care provider about what schedule of regular examinations is right for you. This is a good chance for you to check in with your provider about disease prevention and staying healthy. In between checkups, there are plenty of things you can do on your own. Experts have done a lot of research about which lifestyle changes and preventive measures are most likely to keep you healthy. Ask your health care provider for more information. Weight and diet Eat a healthy diet  Be sure to include plenty of vegetables, fruits, low-fat dairy products, and lean protein.  Do not eat a lot of foods high in solid fats, added sugars, or salt.  Get regular exercise. This is one of the most important things you can do for your health.  Most adults should exercise for at least 150 minutes each week. The exercise should increase your heart rate and make you sweat (moderate-intensity exercise).  Most adults should also do strengthening exercises at least twice a week. This is in addition to the moderate-intensity exercise. Maintain a healthy weight  Body mass index (BMI) is a measurement that can be used to identify possible weight problems. It estimates body  fat based on height and weight. Your health care provider can help determine your BMI and help you achieve or maintain a healthy weight.  For females 16 years of age and older:  A BMI below 18.5 is considered underweight.  A BMI of 18.5 to 24.9 is normal.  A BMI of 25 to 29.9 is considered overweight.  A BMI of 30 and above is considered obese. Watch levels of cholesterol and blood lipids  You should start having your blood tested for lipids and cholesterol at 67 years of age, then have this test every 5 years.  You may need to have your cholesterol levels checked more often if:  Your lipid or cholesterol levels are high.  You are older than 67 years of age.  You are at high risk for heart disease. Cancer screening Lung Cancer  Lung cancer screening is recommended for adults 33-39 years old who are at high risk for lung cancer because of a history of smoking.  A yearly low-dose CT scan of the lungs is recommended for people who:  Currently smoke.  Have quit within the past 15 years.  Have at least a 30-pack-year history of smoking. A pack year is smoking an average of one pack of cigarettes a day for 1 year.  Yearly screening should continue until it has been 15 years since you quit.  Yearly screening should stop if you develop a health problem that would prevent you from having lung cancer treatment. Breast Cancer  Practice breast self-awareness. This means understanding how your breasts normally appear and feel.  It also means doing regular breast self-exams. Let your health care provider know about  any changes, no matter how small.  If you are in your 20s or 30s, you should have a clinical breast exam (CBE) by a health care provider every 1-3 years as part of a regular health exam.  If you are 67 or older, have a CBE every year. Also consider having a breast X-ray (mammogram) every year.  If you have a family history of breast cancer, talk to your health care  provider about genetic screening.  If you are at high risk for breast cancer, talk to your health care provider about having an MRI and a mammogram every year.  Breast cancer gene (BRCA) assessment is recommended for women who have family members with BRCA-related cancers. BRCA-related cancers include:  Breast.  Ovarian.  Tubal.  Peritoneal cancers.  Results of the assessment will determine the need for genetic counseling and BRCA1 and BRCA2 testing. Cervical Cancer  Your health care provider may recommend that you be screened regularly for cancer of the pelvic organs (ovaries, uterus, and vagina). This screening involves a pelvic examination, including checking for microscopic changes to the surface of your cervix (Pap test). You may be encouraged to have this screening done every 3 years, beginning at age 8.  For women ages 15-65, health care providers may recommend pelvic exams and Pap testing every 3 years, or they may recommend the Pap and pelvic exam, combined with testing for human papilloma virus (HPV), every 5 years. Some types of HPV increase your risk of cervical cancer. Testing for HPV may also be done on women of any age with unclear Pap test results.  Other health care providers may not recommend any screening for nonpregnant women who are considered low risk for pelvic cancer and who do not have symptoms. Ask your health care provider if a screening pelvic exam is right for you.  If you have had past treatment for cervical cancer or a condition that could lead to cancer, you need Pap tests and screening for cancer for at least 20 years after your treatment. If Pap tests have been discontinued, your risk factors (such as having a new sexual partner) need to be reassessed to determine if screening should resume. Some women have medical problems that increase the chance of getting cervical cancer. In these cases, your health care provider may recommend more frequent screening and  Pap tests. Colorectal Cancer  This type of cancer can be detected and often prevented.  Routine colorectal cancer screening usually begins at 67 years of age and continues through 67 years of age.  Your health care provider may recommend screening at an earlier age if you have risk factors for colon cancer.  Your health care provider may also recommend using home test kits to check for hidden blood in the stool.  A small camera at the end of a tube can be used to examine your colon directly (sigmoidoscopy or colonoscopy). This is done to check for the earliest forms of colorectal cancer.  Routine screening usually begins at age 54.  Direct examination of the colon should be repeated every 5-10 years through 67 years of age. However, you may need to be screened more often if early forms of precancerous polyps or small growths are found. Skin Cancer  Check your skin from head to toe regularly.  Tell your health care provider about any new moles or changes in moles, especially if there is a change in a mole's shape or color.  Also tell your health care provider if you  have a mole that is larger than the size of a pencil eraser.  Always use sunscreen. Apply sunscreen liberally and repeatedly throughout the day.  Protect yourself by wearing long sleeves, pants, a wide-brimmed hat, and sunglasses whenever you are outside. Heart disease, diabetes, and high blood pressure  High blood pressure causes heart disease and increases the risk of stroke. High blood pressure is more likely to develop in:  People who have blood pressure in the high end of the normal range (130-139/85-89 mm Hg).  People who are overweight or obese.  People who are African American.  If you are 34-74 years of age, have your blood pressure checked every 3-5 years. If you are 49 years of age or older, have your blood pressure checked every year. You should have your blood pressure measured twice-once when you are at a  hospital or clinic, and once when you are not at a hospital or clinic. Record the average of the two measurements. To check your blood pressure when you are not at a hospital or clinic, you can use:  An automated blood pressure machine at a pharmacy.  A home blood pressure monitor.  If you are between 12 years and 48 years old, ask your health care provider if you should take aspirin to prevent strokes.  Have regular diabetes screenings. This involves taking a blood sample to check your fasting blood sugar level.  If you are at a normal weight and have a low risk for diabetes, have this test once every three years after 67 years of age.  If you are overweight and have a high risk for diabetes, consider being tested at a younger age or more often. Preventing infection Hepatitis B  If you have a higher risk for hepatitis B, you should be screened for this virus. You are considered at high risk for hepatitis B if:  You were born in a country where hepatitis B is common. Ask your health care provider which countries are considered high risk.  Your parents were born in a high-risk country, and you have not been immunized against hepatitis B (hepatitis B vaccine).  You have HIV or AIDS.  You use needles to inject street drugs.  You live with someone who has hepatitis B.  You have had sex with someone who has hepatitis B.  You get hemodialysis treatment.  You take certain medicines for conditions, including cancer, organ transplantation, and autoimmune conditions. Hepatitis C  Blood testing is recommended for:  Everyone born from [birth year] through 1967.  Anyone with known risk factors for hepatitis C. Sexually transmitted infections (STIs)  You should be screened for sexually transmitted infections (STIs) including gonorrhea and chlamydia if:  You are sexually active and are younger than 67 years of age.  You are older than 67 years of age and your health care provider tells you  that you are at risk for this type of infection.  Your sexual activity has changed since you were last screened and you are at an increased risk for chlamydia or gonorrhea. Ask your health care provider if you are at risk.  If you do not have HIV, but are at risk, it may be recommended that you take a prescription medicine daily to prevent HIV infection. This is called pre-exposure prophylaxis (PrEP). You are considered at risk if:  You are sexually active and do not regularly use condoms or know the HIV status of your partner(s).  You take drugs by injection.  You are  sexually active with a partner who has HIV. Talk with your health care provider about whether you are at high risk of being infected with HIV. If you choose to begin PrEP, you should first be tested for HIV. You should then be tested every 3 months for as long as you are taking PrEP. Pregnancy  If you are premenopausal and you may become pregnant, ask your health care provider about preconception counseling.  If you may become pregnant, take 400 to 800 micrograms (mcg) of folic acid every day.  If you want to prevent pregnancy, talk to your health care provider about birth control (contraception). Osteoporosis and menopause  Osteoporosis is a disease in which the bones lose minerals and strength with aging. This can result in serious bone fractures. Your risk for osteoporosis can be identified using a bone density scan.  If you are 98 years of age or older, or if you are at risk for osteoporosis and fractures, ask your health care provider if you should be screened.  Ask your health care provider whether you should take a calcium or vitamin D supplement to lower your risk for osteoporosis.  Menopause may have certain physical symptoms and risks.  Hormone replacement therapy may reduce some of these symptoms and risks. Talk to your health care provider about whether hormone replacement therapy is right for you. Follow  these instructions at home:  Schedule regular health, dental, and eye exams.  Stay current with your immunizations.  Do not use any tobacco products including cigarettes, chewing tobacco, or electronic cigarettes.  If you are pregnant, do not drink alcohol.  If you are breastfeeding, limit how much and how often you drink alcohol.  Limit alcohol intake to no more than 1 drink per day for nonpregnant women. One drink equals 12 ounces of beer, 5 ounces of wine, or 1 ounces of hard liquor.  Do not use street drugs.  Do not share needles.  Ask your health care provider for help if you need support or information about quitting drugs.  Tell your health care provider if you often feel depressed.  Tell your health care provider if you have ever been abused or do not feel safe at home. This information is not intended to replace advice given to you by your health care provider. Make sure you discuss any questions you have with your health care provider. Document Released: 03/09/2011 Document Revised: 01/30/2016 Document Reviewed: 05/28/2015  2017 [employer]  Health Maintenance for Postmenopausal Women Introduction Menopause is a normal process in which your reproductive ability comes to an end. This process happens gradually over a span of months to years, usually between the ages of 49 and 24. Menopause is complete when you have missed 12 consecutive menstrual periods. It is important to talk with your health care provider about some of the most common conditions that affect postmenopausal women, such as heart disease, cancer, and bone loss (osteoporosis). Adopting a healthy lifestyle and getting preventive care can help to promote your health and wellness. Those actions can also lower your chances of developing some of these common conditions. What should I know about menopause? During menopause, you may experience a number of symptoms, such as:  Moderate-to-severe hot flashes.  Night  sweats.  Decrease in sex drive.  Mood swings.  Headaches.  Tiredness.  Irritability.  Memory problems.  Insomnia. Choosing to treat or not to treat menopausal changes is an individual decision that you make with your health care provider. What should I  know about hormone replacement therapy and supplements? Hormone therapy products are effective for treating symptoms that are associated with menopause, such as hot flashes and night sweats. Hormone replacement carries certain risks, especially as you become older. If you are thinking about using estrogen or estrogen with progestin treatments, discuss the benefits and risks with your health care provider. What should I know about heart disease and stroke? Heart disease, heart attack, and stroke become more likely as you age. This may be due, in part, to the hormonal changes that your body experiences during menopause. These can affect how your body processes dietary fats, triglycerides, and cholesterol. Heart attack and stroke are both medical emergencies. There are many things that you can do to help prevent heart disease and stroke:  Have your blood pressure checked at least every 1-2 years. High blood pressure causes heart disease and increases the risk of stroke.  If you are 22-4 years old, ask your health care provider if you should take aspirin to prevent a heart attack or a stroke.  Do not use any tobacco products, including cigarettes, chewing tobacco, or electronic cigarettes. If you need help quitting, ask your health care provider.  It is important to eat a healthy diet and maintain a healthy weight.  Be sure to include plenty of vegetables, fruits, low-fat dairy products, and lean protein.  Avoid eating foods that are high in solid fats, added sugars, or salt (sodium).  Get regular exercise. This is one of the most important things that you can do for your health.  Try to exercise for at least 150 minutes each week. The  type of exercise that you do should increase your heart rate and make you sweat. This is known as moderate-intensity exercise.  Try to do strengthening exercises at least twice each week. Do these in addition to the moderate-intensity exercise.  Know your numbers.Ask your health care provider to check your cholesterol and your blood glucose. Continue to have your blood tested as directed by your health care provider. What should I know about cancer screening? There are several types of cancer. Take the following steps to reduce your risk and to catch any cancer development as early as possible. Breast Cancer  Practice breast self-awareness.  This means understanding how your breasts normally appear and feel.  It also means doing regular breast self-exams. Let your health care provider know about any changes, no matter how small.  If you are 62 or older, have a clinician do a breast exam (clinical breast exam or CBE) every year. Depending on your age, family history, and medical history, it may be recommended that you also have a yearly breast X-ray (mammogram).  If you have a family history of breast cancer, talk with your health care provider about genetic screening.  If you are at high risk for breast cancer, talk with your health care provider about having an MRI and a mammogram every year.  Breast cancer (BRCA) gene test is recommended for women who have family members with BRCA-related cancers. Results of the assessment will determine the need for genetic counseling and BRCA1 and for BRCA2 testing. BRCA-related cancers include these types:  Breast. This occurs in males or females.  Ovarian.  Tubal. This may also be called fallopian tube cancer.  Cancer of the abdominal or pelvic lining (peritoneal cancer).  Prostate.  Pancreatic. Cervical, Uterine, and Ovarian Cancer  Your health care provider may recommend that you be screened regularly for cancer of the pelvic organs.  These  include your ovaries, uterus, and vagina. This screening involves a pelvic exam, which includes checking for microscopic changes to the surface of your cervix (Pap test).  For women ages 21-65, health care providers may recommend a pelvic exam and a Pap test every three years. For women ages 37-65, they may recommend the Pap test and pelvic exam, combined with testing for human papilloma virus (HPV), every five years. Some types of HPV increase your risk of cervical cancer. Testing for HPV may also be done on women of any age who have unclear Pap test results.  Other health care providers may not recommend any screening for nonpregnant women who are considered low risk for pelvic cancer and have no symptoms. Ask your health care provider if a screening pelvic exam is right for you.  If you have had past treatment for cervical cancer or a condition that could lead to cancer, you need Pap tests and screening for cancer for at least 20 years after your treatment. If Pap tests have been discontinued for you, your risk factors (such as having a new sexual partner) need to be reassessed to determine if you should start having screenings again. Some women have medical problems that increase the chance of getting cervical cancer. In these cases, your health care provider may recommend that you have screening and Pap tests more often.  If you have a family history of uterine cancer or ovarian cancer, talk with your health care provider about genetic screening.  If you have vaginal bleeding after reaching menopause, tell your health care provider.  There are currently no reliable tests available to screen for ovarian cancer. Lung Cancer  Lung cancer screening is recommended for adults 75-76 years old who are at high risk for lung cancer because of a history of smoking. A yearly low-dose CT scan of the lungs is recommended if you:  Currently smoke.  Have a history of at least 30 pack-years of smoking and you  currently smoke or have quit within the past 15 years. A pack-year is smoking an average of one pack of cigarettes per day for one year. Yearly screening should:  Continue until it has been 15 years since you quit.  Stop if you develop a health problem that would prevent you from having lung cancer treatment. Colorectal Cancer  This type of cancer can be detected and can often be prevented.  Routine colorectal cancer screening usually begins at age 42 and continues through age 67.  If you have risk factors for colon cancer, your health care provider may recommend that you be screened at an earlier age.  If you have a family history of colorectal cancer, talk with your health care provider about genetic screening.  Your health care provider may also recommend using home test kits to check for hidden blood in your stool.  A small camera at the end of a tube can be used to examine your colon directly (sigmoidoscopy or colonoscopy). This is done to check for the earliest forms of colorectal cancer.  Direct examination of the colon should be repeated every 5-10 years until age 36. However, if early forms of precancerous polyps or small growths are found or if you have a family history or genetic risk for colorectal cancer, you may need to be screened more often. Skin Cancer  Check your skin from head to toe regularly.  Monitor any moles. Be sure to tell your health care provider:  About any new moles or changes  in moles, especially if there is a change in a mole's shape or color.  If you have a mole that is larger than the size of a pencil eraser.  If any of your family members has a history of skin cancer, especially at a young age, talk with your health care provider about genetic screening.  Always use sunscreen. Apply sunscreen liberally and repeatedly throughout the day.  Whenever you are outside, protect yourself by wearing long sleeves, pants, a wide-brimmed hat, and  sunglasses. What should I know about osteoporosis? Osteoporosis is a condition in which bone destruction happens more quickly than new bone creation. After menopause, you may be at an increased risk for osteoporosis. To help prevent osteoporosis or the bone fractures that can happen because of osteoporosis, the following is recommended:  If you are 64-81 years old, get at least 1,000 mg of calcium and at least 600 mg of vitamin D per day.  If you are older than age 31 but younger than age 50, get at least 1,200 mg of calcium and at least 600 mg of vitamin D per day.  If you are older than age 40, get at least 1,200 mg of calcium and at least 800 mg of vitamin D per day. Smoking and excessive alcohol intake increase the risk of osteoporosis. Eat foods that are rich in calcium and vitamin D, and do weight-bearing exercises several times each week as directed by your health care provider. What should I know about how menopause affects my mental health? Depression may occur at any age, but it is more common as you become older. Common symptoms of depression include:  Low or sad mood.  Changes in sleep patterns.  Changes in appetite or eating patterns.  Feeling an overall lack of motivation or enjoyment of activities that you previously enjoyed.  Frequent crying spells. Talk with your health care provider if you think that you are experiencing depression. What should I know about immunizations? It is important that you get and maintain your immunizations. These include:  Tetanus, diphtheria, and pertussis (Tdap) booster vaccine.  Influenza every year before the flu season begins.  Pneumonia vaccine.  Shingles vaccine. Your health care provider may also recommend other immunizations. This information is not intended to replace advice given to you by your health care provider. Make sure you discuss any questions you have with your health care provider. Document Released: 10/16/2005  Document Revised: 03/13/2016 Document Reviewed: 05/28/2015  2017 [employer]

## 2016-09-03 NOTE — Progress Notes (Signed)
BP 138/80   Pulse 88   Temp 98.2 F (36.8 C)   Ht 5' 5.6" (1.666 m)   Wt 211 lb 12.8 oz (96.1 kg)   SpO2 97%   BMI 34.60 kg/m    Subjective:    Patient ID: Leslie Olsen, female    DOB: Jul 11, 1951, 65 y.o.   MRN: [id number]  HPI: Leslie Olsen is a 65 y.o. female presenting on 09/03/2016 for comprehensive medical examination. Current medical complaints include:  HYPERTENSION / HYPERLIPIDEMIA Satisfied with current treatment? yes Duration of hypertension: chronic BP monitoring frequency: not checking BP medication side effects: no Duration of hyperlipidemia: chronic Cholesterol medication side effects: no Cholesterol supplements: none Past cholesterol medications: crestor Medication compliance: excellent compliance Aspirin: yes Recent stressors: yes Recurrent headaches: yes Visual changes: no Palpitations: no Dyspnea: no Chest pain: no Lower extremity edema: no Dizzy/lightheaded: no  HYPOTHYROIDISM Thyroid control status:better Satisfied with current treatment? yes Medication side effects: no Medication compliance: excellent compliance Recent dose adjustment:yes Fatigue: yes Cold intolerance: no Heat intolerance: no Weight gain: yes Weight loss: no Constipation: yes Diarrhea/loose stools: yes Palpitations: no Lower extremity edema: no Anxiety/depressed mood: no  ANEMIA- feels like it's acting up again. Wants to have her levels checked again Anemia status: exacerbated Etiology of anemia: iron deficient Duration of anemia treatment: years Compliance with treatment: excellent compliance Iron supplementation side effects: no Severity of anemia: moderate Fatigue: yes Decreased exercise tolerance: yes  Dyspnea on exertion: no Palpitations: no Bleeding: no Pica: no  DIABETES Hypoglycemic episodes:no Polydipsia/polyuria: no Visual disturbance: no Chest pain: no Paresthesias: yes, occasionally  Glucose Monitoring: yes  Accucheck frequency: at  random Taking Insulin?: no Blood Pressure Monitoring: not checking Retinal Examination: Not Up to Date Foot Exam: Up to Date Diabetic Education: Completed Pneumovax: Up to Date Influenza: Up to Date Aspirin: yes  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  She currently lives with: alone Menopausal Symptoms: no  Functional Status Survey: Is the patient deaf or have difficulty hearing?: No Does the patient have difficulty seeing, even when wearing glasses/contacts?: No (pt states needs reading glasses) Does the patient have difficulty concentrating, remembering, or making decisions?: No Does the patient have difficulty walking or climbing stairs?: Yes (pt states this wears her out ) Does the patient have difficulty dressing or bathing?: No Does the patient have difficulty doing errands alone such as visiting a doctor's office or shopping?: No  Fall Risk  09/03/2016 02/21/2016  Falls in the past year? No No    Depression Screen Depression screen [facility] 2/9 09/03/2016 02/21/2016  Decreased Interest 0 0  Down, Depressed, Hopeless 0 0  PHQ - 2 Score 0 0  Altered sleeping 2 -  Tired, decreased energy 1 -  Change in appetite 3 -  Feeling bad or failure about yourself  0 -  Trouble concentrating 0 -  Moving slowly or fidgety/restless 1 -  Suicidal thoughts 0 -  PHQ-9 Score 7 -    Advanced Directives Does patient have a HCPOA?    no Does patient have a living will or MOST form?  no  Past Medical History:  Past Medical History:  Diagnosis Date  . Anemia   . Chronic headaches   . COPD (chronic obstructive pulmonary disease) ([place])   . Diabetes mellitus without complication ([place])   . GERD (gastroesophageal reflux disease)   . Hypertension   . Hypothyroidism     Surgical History:  Past Surgical History:  Procedure Laterality Date  .  ABDOMINAL HYSTERECTOMY    . BACK SURGERY    . CHOLECYSTECTOMY    . CORONARY STENT PLACEMENT    . ESOPHAGOGASTRODUODENOSCOPY  (EGD) WITH PROPOFOL N/A 05/26/2016   Procedure: ESOPHAGOGASTRODUODENOSCOPY (EGD) WITH PROPOFOL;  Surgeon: Lucilla Lame, MD;  Location: [facility] ENDOSCOPY;  Service: Endoscopy;  Laterality: N/A;  . GIVENS CAPSULE STUDY N/A 05/05/2016   Procedure: GIVENS CAPSULE STUDY;  Surgeon: Lucilla Lame, MD;  Location: [facility] ENDOSCOPY;  Service: Endoscopy;  Laterality: N/A;  . REDUCTION MAMMAPLASTY    . REPLACEMENT TOTAL KNEE    . SHOULDER SURGERY    . TIBIA FRACTURE SURGERY      Medications:  Current Outpatient Prescriptions on File Prior to Visit  Medication Sig  . albuterol (PROVENTIL HFA;VENTOLIN HFA) 108 (90 Base) MCG/ACT inhaler Inhale 2 puffs into the lungs every 6 (six) hours as needed for wheezing.  Marland Kitchen aspirin 81 MG tablet Take 81 mg by mouth daily.  . budesonide-formoterol (SYMBICORT) 160-4.5 MCG/ACT inhaler Inhale 1 puff into the lungs 2 (two) times daily.  . clonazePAM (KLONOPIN) 0.5 MG tablet Take 0.5 mg by mouth at bedtime.   . ferrous fumarate (HEMOCYTE - 106 MG FE) 325 (106 FE) MG TABS tablet Take 1 tablet by mouth daily.  Marland Kitchen gabapentin (NEURONTIN) 300 MG capsule Take 1 capsule (300 mg total) by mouth 3 (three) times daily.  Marland Kitchen glucose blood (COOL BLOOD GLUCOSE TEST STRIPS) test strip Use as instructed  . levothyroxine (SYNTHROID, LEVOTHROID) 88 MCG tablet Take 1 tablet (88 mcg total) by mouth daily. Brand name required  . lisinopril (PRINIVIL,ZESTRIL) 5 MG tablet Take 1 tablet (5 mg total) by mouth daily.  . Omega-3 Fatty Acids (FISH OIL) 1200 MG CAPS Take 1 capsule by mouth daily.  . SUMAtriptan (IMITREX) 100 MG tablet Take 100 mg by mouth every 2 (two) hours as needed for migraine. May repeat in 2 hours if headache persists or recurs.  . nortriptyline (PAMELOR) 25 MG capsule Take 1 capsule (25 mg total) by mouth at bedtime. (Patient not taking: Reported on 09/03/2016)   No current facility-administered medications on file prior to visit.     Allergies:  No Known Allergies  Social History:    Social History   Social History  . Marital status: Divorced    Spouse name: N/A  . Number of children: N/A  . Years of education: N/A   Occupational History  . Not on file.   Social History Main Topics  . Smoking status: Former Smoker    Packs/day: 2.00    Years: 47.00    Types: Cigarettes    Quit date: 11/06/2011  . Smokeless tobacco: Never Used  . Alcohol use No  . Drug use: No  . Sexual activity: Not on file   Other Topics Concern  . Not on file   Social History Narrative   Lives alone.  She has one grown son.   She works as a Retail buyer at [employer]   Highest level of education was high school   History  Smoking Status  . Former Smoker  . Packs/day: 2.00  . Years: 47.00  . Types: Cigarettes  . Quit date: 11/06/2011  Smokeless Tobacco  . Never Used   History  Alcohol Use No    Family History:  Family History  Problem Relation Age of Onset  . Lung cancer Maternal Aunt   . Breast cancer Maternal Aunt 57  . Other Mother     Killed in car accident, 49  . Other  Father     Killed in car accident, 52  . Thyroid disease Brother   . Thyroid disease Sister     Past medical history, surgical history, medications, allergies, family history and social history reviewed with patient today and changes made to appropriate areas of the chart.   Review of Systems  Constitutional: Negative.   HENT: Negative.   Eyes: Negative.   Respiratory: Negative.   Cardiovascular: Negative.   Gastrointestinal: Positive for constipation and diarrhea. Negative for abdominal pain, blood in stool, heartburn, melena, nausea and vomiting.  Genitourinary: Positive for urgency. Negative for dysuria, flank pain, frequency and hematuria.  Musculoskeletal: Positive for myalgias. Negative for back pain, falls, joint pain and neck pain.  Skin: Negative.   Neurological: Positive for dizziness, tingling and headaches. Negative for tremors, sensory change, speech change, focal weakness,  seizures and loss of consciousness.  Endo/Heme/Allergies: Negative for environmental allergies and polydipsia. Bruises/bleeds easily.  Psychiatric/Behavioral: Negative.     All other ROS negative except what is listed above and in the HPI.      Objective:    BP 138/80   Pulse 88   Temp 98.2 F (36.8 C)   Ht 5' 5.6" (1.666 m)   Wt 211 lb 12.8 oz (96.1 kg)   SpO2 97%   BMI 34.60 kg/m   Wt Readings from Last 3 Encounters:  09/03/16 211 lb 12.8 oz (96.1 kg)  07/23/16 208 lb 4.8 oz (94.5 kg)  06/02/16 204 lb (92.5 kg)     Hearing Screening   125Hz  250Hz  500Hz  1000Hz  2000Hz  3000Hz  4000Hz  6000Hz  8000Hz   Right ear:   Pass Pass Pass  Pass    Left ear:   Pass Pass Pass  Pass      Visual Acuity Screening   Right eye Left eye Both eyes  Without correction: 20/20 20/20 20/15   With correction:       Physical Exam  Constitutional: She is oriented to person, place, and time. She appears well-developed and well-nourished. No distress.  HENT:  Head: Normocephalic and atraumatic.  Right Ear: Hearing, tympanic membrane, external ear and ear canal normal.  Left Ear: Hearing, tympanic membrane, external ear and ear canal normal.  Nose: Nose normal.  Mouth/Throat: Uvula is midline, oropharynx is clear and moist and mucous membranes are normal. No oropharyngeal exudate.  Eyes: Conjunctivae, EOM and lids are normal. Pupils are equal, round, and reactive to light. Right eye exhibits no discharge. Left eye exhibits no discharge. No scleral icterus.  Neck: Normal range of motion. Neck supple. No JVD present. No tracheal deviation present. No thyromegaly present.  Cardiovascular: Normal rate, regular rhythm, normal heart sounds and intact distal pulses.  Exam reveals no gallop and no friction rub.   No murmur heard. Pulmonary/Chest: Effort normal and breath sounds normal. No stridor. No respiratory distress. She has no wheezes. She has no rales. She exhibits no tenderness.  Abdominal: Soft. Bowel  sounds are normal. She exhibits no distension and no mass. There is no tenderness. There is no rebound and no guarding.  Genitourinary:  Genitourinary Comments: Breast and GYN exam deferred with shared decision making.  Musculoskeletal: Normal range of motion. She exhibits no edema, tenderness or deformity.  Lymphadenopathy:    She has no cervical adenopathy.  Neurological: She is alert and oriented to person, place, and time. She has normal reflexes. She displays normal reflexes. No cranial nerve deficit. She exhibits normal muscle tone. Coordination normal.  Skin: Skin is warm and intact. No rash noted.  She is not diaphoretic. No erythema. No pallor.  Psychiatric: She has a normal mood and affect. Her speech is normal and behavior is normal. Judgment and thought content normal. Cognition and memory are normal.  Nursing note and vitals reviewed.  Diabetic Foot Exam - Simple   Simple Foot Form Diabetic Foot exam was performed with the following findings:  Yes 09/03/2016  9:59 AM  Visual Inspection No deformities, no ulcerations, no other skin breakdown bilaterally:  Yes Sensation Testing See comments:  Yes Pulse Check Posterior Tibialis and Dorsalis pulse intact bilaterally:  Yes Comments Decreased sensation to R ball of the foot due to previous surgery     6CIT Screen 09/03/2016  What Year? 0 points  What month? 0 points  What time? 0 points  Count back from 20 0 points  Months in reverse 0 points  Repeat phrase 2 points  Total Score 2    Results for orders placed or performed in visit on 07/23/16  TSH  Result Value Ref Range   TSH 5.140 (H) 0.450 - 4.500 uIU/mL  B12 and Folate Panel  Result Value Ref Range   Vitamin B-12 300 211 - 946 pg/mL   Folate 10.3 >3.0 ng/mL  Lyme Ab/Western Blot Reflex  Result Value Ref Range   Lyme IgG/IgM Ab <0.91 0.00 - 0.90 ISR   LYME DISEASE AB, QUANT, IGM <0.80 0.00 - 0.79 index  Rocky mtn spotted fvr abs pnl(IgG+IgM)  Result Value Ref  Range   RMSF IgG Negative Negative   RMSF IgM 1.71 (H) 0.00 - 123456 index  Ehrlichia Antibody Panel  Result Value Ref Range   E.Chaffeensis (HME) IgG Negative Neg:<1:64   E. Chaffeensis (HME) IgM Titer Negative Neg:<1:20   HGE IgG Titer Negative Neg:<1:64   HGE IgM Titer Negative Neg:<1:20  Babesia microti Antibody Panel  Result Value Ref Range   Babesia microti IgM <1:10 Neg:<1:10   Babesia microti IgG <1:10 Neg:<1:10      Assessment & Plan:   Problem List Items Addressed This Visit      Cardiovascular and Mediastinum   Essential hypertension, benign    Better on recheck. Continue current regimen. Continue to monitor. Call with any concerns.       Relevant Medications   rosuvastatin (CRESTOR) 40 MG tablet   Other Relevant Orders   Comprehensive metabolic panel   Microalbumin, Urine Waived   Atherosclerosis of native coronary artery of native heart without angina pectoris    Stable. No pain. Continue to follow with cardiology as needed. Call with any concerns.       Relevant Medications   rosuvastatin (CRESTOR) 40 MG tablet   Other Relevant Orders   Comprehensive metabolic panel     Respiratory   COPD (chronic obstructive pulmonary disease) ([facility])    Stable. Continue current regimen. Continue to monitor. Call with any concerns. Follow up with pulmonology as needed.         Endocrine   Diabetes mellitus without complication ([facility])    123456 up to 7.9. Will increase her metformin to 1000mg  BID and recheck 3 months.       Relevant Medications   rosuvastatin (CRESTOR) 40 MG tablet   metFORMIN (GLUCOPHAGE) 500 MG tablet   Other Relevant Orders   Bayer DCA Hb A1c Waived   Comprehensive metabolic panel   Microalbumin, Urine Waived   UA/M w/rflx Culture, Routine   Thyroid activity decreased    Rechecking labs today. Will adjust dose as needed. Call with any concerns.  Relevant Orders   TSH     Nervous and Auditory   Dysphonia spastica    Doing better with her  klonopin. Continue to follow with neurology. Call with any concerns.         Other   Hyperlipidemia    Still elevated. May need to consider other medication options. Recheck 6 months- really work on diet and exercise. Continue current regimen.      Relevant Medications   rosuvastatin (CRESTOR) 40 MG tablet   Other Relevant Orders   Comprehensive metabolic panel   Lipid Panel Piccolo, Waived   Iron deficiency anemia    HGB good today at 12.6. Will check ferritin and iron studies. Seeing hematology in February.      Relevant Orders   CBC With Differential/Platelet   Iron and TIBC   Ferritin    Other Visit Diagnoses    Medicare annual wellness visit, subsequent    -  Primary   Preventative care discussed and ordered as below.    Fatigue, unspecified type       Rechecking vitamin D and other labs. Likely multi-factorial.   Relevant Orders   VITAMIN D 25 Hydroxy (Vit-D Deficiency, Fractures)   Screening for breast cancer       Mammogram ordered today.   Relevant Orders   MM DIGITAL SCREENING BILATERAL   Former heavy tobacco smoker       Note to [employer] sent for ? low dose lung cancer screening       Preventative Services:  Health Risk Assessment and Personalized Prevention Plan: Done today Bone Mass Measurements: up to date Breast Cancer Screening: Ordered today CVD Screening: Done today Cervical Cancer Screening: N/A Colon Cancer Screening: Up to date Depression Screening: Done today Diabetes Screening: Done today Glaucoma Screening: See your eye doctor Hepatitis B vaccine: N/A Hepatitis C screening: up to date HIV Screening: up to date Flu Vaccine: up to date Lung cancer Screening: Ordered today Obesity Screening: Done today Pneumonia Vaccines (2): up to date STI Screening: N/A  Follow up plan: Return in about 3 months (around 12/02/2016) for DM visit.   LABORATORY TESTING:  - Pap smear: not applicable  IMMUNIZATIONS:   - Tdap: Tetanus vaccination  status reviewed: last tetanus booster within 10 years. - Influenza: Up to date - Pneumovax: Up to date - Prevnar: Up to date - Zostavax vaccine: Up to date  SCREENING: -Mammogram: Ordered today  - Colonoscopy: Up to date  - Bone Density: Up to date  -Hearing Test: Ordered today  -Spirometry: Done elsewhere   PATIENT COUNSELING:   Advised to take 1 mg of folate supplement per day if capable of pregnancy.   Sexuality: Discussed sexually transmitted diseases, partner selection, use of condoms, avoidance of unintended pregnancy  and contraceptive alternatives.   Advised to avoid cigarette smoking.  I discussed with the patient that most people either abstain from alcohol or drink within safe limits (<=14/week and <=4 drinks/occasion for males, <=7/weeks and <= 3 drinks/occasion for females) and that the risk for alcohol disorders and other health effects rises proportionally with the number of drinks per week and how often a drinker exceeds daily limits.  Discussed cessation/primary prevention of drug use and availability of treatment for abuse.   Diet: Encouraged to adjust caloric intake to maintain  or achieve ideal body weight, to reduce intake of dietary saturated fat and total fat, to limit sodium intake by avoiding high sodium foods and not adding table salt, and to maintain  adequate dietary potassium and calcium preferably from fresh fruits, vegetables, and low-fat dairy products.    stressed the importance of regular exercise  Injury prevention: Discussed safety belts, safety helmets, smoke detector, smoking near bedding or upholstery.   Dental health: Discussed importance of regular tooth brushing, flossing, and dental visits.    NEXT PREVENTATIVE PHYSICAL DUE IN 1 YEAR. Return in about 3 months (around 12/02/2016) for DM visit.

## 2016-09-03 NOTE — Assessment & Plan Note (Signed)
Doing better with her klonopin. Continue to follow with neurology. Call with any concerns.

## 2016-09-03 NOTE — Assessment & Plan Note (Signed)
Stable. No pain. Continue to follow with cardiology as needed. Call with any concerns.

## 2016-09-03 NOTE — Assessment & Plan Note (Signed)
HGB good today at 12.6. Will check ferritin and iron studies. Seeing hematology in February.

## 2016-09-04 ENCOUNTER — Telehealth: Payer: Self-pay | Admitting: *Deleted

## 2016-09-04 ENCOUNTER — Telehealth: Payer: Self-pay | Admitting: Family Medicine

## 2016-09-04 DIAGNOSIS — E039 Hypothyroidism, unspecified: Secondary | ICD-10-CM

## 2016-09-04 LAB — COMPREHENSIVE METABOLIC PANEL
A/G RATIO: 1.3 (ref 1.2–2.2)
ALT: 25 IU/L (ref 0–32)
AST: 25 IU/L (ref 0–40)
Albumin: 4 g/dL (ref 3.6–4.8)
Alkaline Phosphatase: 87 IU/L (ref 39–117)
BUN/Creatinine Ratio: 19 (ref 12–28)
BUN: 16 mg/dL (ref 8–27)
Bilirubin Total: 0.3 mg/dL (ref 0.0–1.2)
CALCIUM: 9.3 mg/dL (ref 8.7–10.3)
CO2: 24 mmol/L (ref 18–29)
Chloride: 100 mmol/L (ref 96–106)
Creatinine, Ser: 0.86 mg/dL (ref 0.57–1.00)
GFR, EST AFRICAN AMERICAN: 81 mL/min/{1.73_m2} (ref >59)
GFR, EST NON AFRICAN AMERICAN: 70 mL/min/{1.73_m2} (ref >59)
GLUCOSE: 169 mg/dL — AB (ref 65–99)
Globulin, Total: 3 g/dL (ref 1.5–4.5)
POTASSIUM: 4.6 mmol/L (ref 3.5–5.2)
Sodium: 139 mmol/L (ref 134–144)
TOTAL PROTEIN: 7 g/dL (ref 6.0–8.5)

## 2016-09-04 LAB — IRON AND TIBC
IRON SATURATION: 20 % (ref 15–55)
IRON: 65 ug/dL (ref 27–139)
Total Iron Binding Capacity: 324 ug/dL (ref 250–450)
UIBC: 259 ug/dL (ref 118–369)

## 2016-09-04 LAB — TSH: TSH: 4.58 u[IU]/mL — AB (ref 0.450–4.500)

## 2016-09-04 LAB — FERRITIN: Ferritin: 25 ng/mL (ref 15–150)

## 2016-09-04 LAB — VITAMIN D 25 HYDROXY (VIT D DEFICIENCY, FRACTURES): Vit D, 25-Hydroxy: 19.7 ng/mL — ABNORMAL LOW (ref 30.0–100.0)

## 2016-09-04 MED ORDER — LEVOTHYROXINE SODIUM 100 MCG PO TABS: 100.0000 ug | ORAL_TABLET | Freq: Every day | ORAL | 1 refills | Status: DC

## 2016-09-04 NOTE — Telephone Encounter (Signed)
Called and gave patient the results. Thyroid still underactive. Will increase her dose to 154mcg and recheck 6 weeks.

## 2016-09-04 NOTE — Telephone Encounter (Signed)
Received referral for initial lung cancer screening scan. Contacted patient and obtained smoking history,(former, quit 11/2011, 90 pack year) as well as answering questions related to screening process. Patient denies signs of lung cancer such as weight loss or hemoptysis. Patient denies comorbidity that would prevent curative treatment if lung cancer were found. Patient had CT scan in 2017 and 10/31/16 is earliest that lung screening scan would be recommended. Will follow up at that time.

## 2016-09-17 DIAGNOSIS — D649 Anemia, unspecified: Secondary | ICD-10-CM | POA: Diagnosis not present

## 2016-09-17 DIAGNOSIS — K31819 Angiodysplasia of stomach and duodenum without bleeding: Secondary | ICD-10-CM | POA: Diagnosis not present

## 2016-09-21 ENCOUNTER — Other Ambulatory Visit: Payer: Self-pay | Admitting: Family Medicine

## 2016-09-21 ENCOUNTER — Telehealth: Payer: Self-pay

## 2016-09-21 ENCOUNTER — Ambulatory Visit
Admission: RE | Admit: 2016-09-21 | Discharge: 2016-09-21 | Disposition: A | Discharge status: Home / Self Care | Payer: PPO | Source: Ambulatory Visit | Attending: Family Medicine | Admitting: Family Medicine

## 2016-09-21 DIAGNOSIS — Z1231 Encounter for screening mammogram for malignant neoplasm of breast: Secondary | ICD-10-CM

## 2016-09-21 DIAGNOSIS — N644 Mastodynia: Secondary | ICD-10-CM

## 2016-09-21 DIAGNOSIS — Z1239 Encounter for other screening for malignant neoplasm of breast: Secondary | ICD-10-CM

## 2016-09-21 NOTE — Telephone Encounter (Signed)
4 attempts to schedule fu from recall list. Deleting recall.

## 2016-09-22 ENCOUNTER — Telehealth: Payer: Self-pay | Admitting: Family Medicine

## 2016-09-22 NOTE — Telephone Encounter (Signed)
Orders for mammogram placed by Dr. Wynetta Emery on 09/21/16.  Test strips order faxed.

## 2016-09-30 DIAGNOSIS — G253 Myoclonus: Secondary | ICD-10-CM | POA: Diagnosis not present

## 2016-09-30 DIAGNOSIS — Z6833 Body mass index (BMI) 33.0-33.9, adult: Secondary | ICD-10-CM | POA: Diagnosis not present

## 2016-10-05 ENCOUNTER — Ambulatory Visit
Admission: RE | Admit: 2016-10-05 | Discharge: 2016-10-05 | Disposition: A | Discharge status: Home / Self Care | Payer: PPO | Source: Ambulatory Visit | Attending: Family Medicine | Admitting: Family Medicine

## 2016-10-05 ENCOUNTER — Encounter: Payer: Self-pay | Admitting: Family Medicine

## 2016-10-05 DIAGNOSIS — N644 Mastodynia: Secondary | ICD-10-CM

## 2016-10-05 DIAGNOSIS — Z1231 Encounter for screening mammogram for malignant neoplasm of breast: Secondary | ICD-10-CM | POA: Insufficient documentation

## 2016-10-05 DIAGNOSIS — R928 Other abnormal and inconclusive findings on diagnostic imaging of breast: Secondary | ICD-10-CM | POA: Diagnosis not present

## 2016-10-05 DIAGNOSIS — Z1239 Encounter for other screening for malignant neoplasm of breast: Secondary | ICD-10-CM

## 2016-10-05 DIAGNOSIS — N6489 Other specified disorders of breast: Secondary | ICD-10-CM | POA: Diagnosis not present

## 2016-10-15 ENCOUNTER — Telehealth: Payer: Self-pay | Admitting: *Deleted

## 2016-10-15 DIAGNOSIS — Z87891 Personal history of nicotine dependence: Secondary | ICD-10-CM

## 2016-10-15 NOTE — Telephone Encounter (Signed)
Received referral for initial lung cancer screening scan. Contacted patient and obtained smoking history,(former, quit 11/2011, 90 pack year) as well as answering questions related to screening process. Patient denies signs of lung cancer such as weight loss or hemoptysis. Patient denies comorbidity that would prevent curative treatment if lung cancer were found. Patient is tentatively scheduled for shared decision making visit and CT scan on 11/03/16 at 1:30pm, pending insurance approval from business office.

## 2016-10-15 NOTE — Telephone Encounter (Signed)
Received referral for low dose lung cancer screening CT scan. Voicemail left at phone number listed in EMR for patient to call me back to facilitate scheduling scan.  

## 2016-10-28 ENCOUNTER — Inpatient Hospital Stay: Payer: PPO | Attending: Hematology and Oncology

## 2016-10-28 DIAGNOSIS — M79605 Pain in left leg: Secondary | ICD-10-CM | POA: Diagnosis not present

## 2016-10-28 DIAGNOSIS — D508 Other iron deficiency anemias: Secondary | ICD-10-CM | POA: Insufficient documentation

## 2016-10-28 DIAGNOSIS — D509 Iron deficiency anemia, unspecified: Secondary | ICD-10-CM

## 2016-10-28 DIAGNOSIS — G4730 Sleep apnea, unspecified: Secondary | ICD-10-CM | POA: Insufficient documentation

## 2016-10-28 DIAGNOSIS — J449 Chronic obstructive pulmonary disease, unspecified: Secondary | ICD-10-CM | POA: Diagnosis not present

## 2016-10-28 DIAGNOSIS — Z7984 Long term (current) use of oral hypoglycemic drugs: Secondary | ICD-10-CM | POA: Diagnosis not present

## 2016-10-28 DIAGNOSIS — G248 Other dystonia: Secondary | ICD-10-CM | POA: Diagnosis not present

## 2016-10-28 DIAGNOSIS — E1143 Type 2 diabetes mellitus with diabetic autonomic (poly)neuropathy: Secondary | ICD-10-CM | POA: Diagnosis not present

## 2016-10-28 DIAGNOSIS — I10 Essential (primary) hypertension: Secondary | ICD-10-CM | POA: Insufficient documentation

## 2016-10-28 DIAGNOSIS — Z79899 Other long term (current) drug therapy: Secondary | ICD-10-CM | POA: Diagnosis not present

## 2016-10-28 DIAGNOSIS — K219 Gastro-esophageal reflux disease without esophagitis: Secondary | ICD-10-CM | POA: Diagnosis not present

## 2016-10-28 DIAGNOSIS — Q2739 Arteriovenous malformation, other site: Secondary | ICD-10-CM | POA: Insufficient documentation

## 2016-10-28 DIAGNOSIS — K3184 Gastroparesis: Secondary | ICD-10-CM | POA: Insufficient documentation

## 2016-10-28 DIAGNOSIS — Z9989 Dependence on other enabling machines and devices: Secondary | ICD-10-CM | POA: Diagnosis not present

## 2016-10-28 DIAGNOSIS — M79604 Pain in right leg: Secondary | ICD-10-CM | POA: Diagnosis not present

## 2016-10-28 DIAGNOSIS — R5381 Other malaise: Secondary | ICD-10-CM | POA: Insufficient documentation

## 2016-10-28 DIAGNOSIS — E039 Hypothyroidism, unspecified: Secondary | ICD-10-CM | POA: Insufficient documentation

## 2016-10-28 DIAGNOSIS — Z87891 Personal history of nicotine dependence: Secondary | ICD-10-CM | POA: Diagnosis not present

## 2016-10-28 DIAGNOSIS — Z8371 Family history of colonic polyps: Secondary | ICD-10-CM | POA: Insufficient documentation

## 2016-10-28 DIAGNOSIS — G253 Myoclonus: Secondary | ICD-10-CM | POA: Diagnosis not present

## 2016-10-28 LAB — CBC WITH DIFFERENTIAL/PLATELET
Basophils Absolute: 0.1 10*3/uL (ref 0–0.1)
Basophils Relative: 1 %
Eosinophils Absolute: 0.3 10*3/uL (ref 0–0.7)
Eosinophils Relative: 4 %
HCT: 35 % (ref 35.0–47.0)
Hemoglobin: 11.8 g/dL — ABNORMAL LOW (ref 12.0–16.0)
Lymphocytes Relative: 35 %
Lymphs Abs: 2.3 10*3/uL (ref 1.0–3.6)
MCH: 29.4 pg (ref 26.0–34.0)
MCHC: 33.7 g/dL (ref 32.0–36.0)
MCV: 87.1 fL (ref 80.0–100.0)
Monocytes Absolute: 0.5 10*3/uL (ref 0.2–0.9)
Monocytes Relative: 8 %
Neutro Abs: 3.5 10*3/uL (ref 1.4–6.5)
Neutrophils Relative %: 52 %
Platelets: 274 10*3/uL (ref 150–440)
RBC: 4.02 MIL/uL (ref 3.80–5.20)
RDW: 13.1 % (ref 11.5–14.5)
WBC: 6.7 10*3/uL (ref 3.6–11.0)

## 2016-10-28 LAB — FERRITIN: Ferritin: 10 ng/mL — ABNORMAL LOW (ref 11–307)

## 2016-10-29 ENCOUNTER — Encounter: Payer: Self-pay | Admitting: Hematology and Oncology

## 2016-10-29 ENCOUNTER — Other Ambulatory Visit: Payer: PPO

## 2016-10-29 ENCOUNTER — Inpatient Hospital Stay (HOSPITAL_BASED_OUTPATIENT_CLINIC_OR_DEPARTMENT_OTHER): Payer: PPO | Admitting: Hematology and Oncology

## 2016-10-29 ENCOUNTER — Inpatient Hospital Stay: Payer: PPO

## 2016-10-29 VITALS — BP 136/74 | HR 93 | Temp 98.0°F | Resp 18 | Wt 205.0 lb

## 2016-10-29 DIAGNOSIS — R5381 Other malaise: Secondary | ICD-10-CM | POA: Diagnosis not present

## 2016-10-29 DIAGNOSIS — E1143 Type 2 diabetes mellitus with diabetic autonomic (poly)neuropathy: Secondary | ICD-10-CM | POA: Diagnosis not present

## 2016-10-29 DIAGNOSIS — G4730 Sleep apnea, unspecified: Secondary | ICD-10-CM

## 2016-10-29 DIAGNOSIS — G248 Other dystonia: Secondary | ICD-10-CM

## 2016-10-29 DIAGNOSIS — G253 Myoclonus: Secondary | ICD-10-CM | POA: Diagnosis not present

## 2016-10-29 DIAGNOSIS — Z79899 Other long term (current) drug therapy: Secondary | ICD-10-CM

## 2016-10-29 DIAGNOSIS — M79605 Pain in left leg: Secondary | ICD-10-CM

## 2016-10-29 DIAGNOSIS — M79604 Pain in right leg: Secondary | ICD-10-CM | POA: Diagnosis not present

## 2016-10-29 DIAGNOSIS — J449 Chronic obstructive pulmonary disease, unspecified: Secondary | ICD-10-CM

## 2016-10-29 DIAGNOSIS — E039 Hypothyroidism, unspecified: Secondary | ICD-10-CM

## 2016-10-29 DIAGNOSIS — K3184 Gastroparesis: Secondary | ICD-10-CM | POA: Diagnosis not present

## 2016-10-29 DIAGNOSIS — D509 Iron deficiency anemia, unspecified: Secondary | ICD-10-CM

## 2016-10-29 DIAGNOSIS — D508 Other iron deficiency anemias: Secondary | ICD-10-CM | POA: Diagnosis not present

## 2016-10-29 DIAGNOSIS — Z87891 Personal history of nicotine dependence: Secondary | ICD-10-CM

## 2016-10-29 DIAGNOSIS — K219 Gastro-esophageal reflux disease without esophagitis: Secondary | ICD-10-CM

## 2016-10-29 DIAGNOSIS — Q2739 Arteriovenous malformation, other site: Secondary | ICD-10-CM | POA: Diagnosis not present

## 2016-10-29 DIAGNOSIS — Z7984 Long term (current) use of oral hypoglycemic drugs: Secondary | ICD-10-CM

## 2016-10-29 DIAGNOSIS — Z9989 Dependence on other enabling machines and devices: Secondary | ICD-10-CM

## 2016-10-29 DIAGNOSIS — I10 Essential (primary) hypertension: Secondary | ICD-10-CM

## 2016-10-29 MED ORDER — IRON SUCROSE 20 MG/ML IV SOLN
200.0000 mg | Freq: Once | INTRAVENOUS | Status: AC
  Administered 2016-10-29: 200 mg via INTRAVENOUS
  Filled 2016-10-29: qty 10

## 2016-10-29 MED ORDER — SODIUM CHLORIDE 0.9 % IV SOLN: 200.0000 mg | Freq: Once | INTRAVENOUS | Status: DC

## 2016-10-29 MED ORDER — SODIUM CHLORIDE 0.9 % IV SOLN
Freq: Once | INTRAVENOUS | Status: AC
  Administered 2016-10-29: 16:00:00 via INTRAVENOUS
  Filled 2016-10-29: qty 1000

## 2016-10-29 NOTE — Progress Notes (Signed)
[facility] day:  10/29/16   Chief Complaint: Leslie Olsen is an 66 y.o. female with iron deficiency anemia who seen for 6 month assessment.  HPI: The patient was last seen in the medical oncology clinic on 04/30/2016.  At that time, she had acute on chronic shortness of breath.  She feelt like someone was sitting on her chest.  Etiology was unclear (? cardiac, embolism).  Hematocrit was 35.6.  Ferritin was 18.  She was transported to the emergency room.  She worked up for ACS, PE and pulmonary mass or nodule. Work-up was negative. She was discharged and told to follow-up with PCP Dr. Wynetta Emery.   She received Venofer on 05/07/2016.   CBC on 09/03/2016 revealed a hematocrit 37.5, hemoglobin 12.6 and MCV 92. Ferritin was 25 with an iron saturation of 20% and a TIBC of 324.   CBC on 10/28/2016 revealed a hematocrit of 35, hemoglobin 11.8, MCV 87.1, platelets 274,000, white count 6700 with an ANC of 3500. Ferritin was 10.  Dr. Wynetta Emery referred her to [facility] to see a vocal specialist- ENT ambulatory referral to ENT. She was seen Dr. Mickie Bail with neurology at [facility] where she was diagnosed with myoclonic dystonia.  She had a normal fluoroscopic study of the diaphragm. MRI of cervical spine was unremarkable. MRI of head was unremarkable.  She was placed on Klonopin 0.5 once a day and symptoms got better and now within the past week have gotten worse. She is now taking Kolonopin 0.5mg  TID. She still notes SOB and out of breath while talking.   She was seen on 09/17/2016 by GI at [facility] by Dr. Maralyn Sago for AVM, esophagitis and gastroparesis.   Symptomatically, she complains of bilateral lower extremity pain. Her performance status has declined. She denies any melena or hemchezia. She is miserable. She is exhausted from the amount of energy it takes to talk and walk.    Past Medical History:  Diagnosis Date  . Anemia   . Chronic headaches    . COPD (chronic obstructive pulmonary disease) ([place])   . Diabetes mellitus without complication ([place])   . GERD (gastroesophageal reflux disease)   . Hypertension   . Hypothyroidism     Past Surgical History:  Procedure Laterality Date  . ABDOMINAL HYSTERECTOMY    . BACK SURGERY    . CHOLECYSTECTOMY    . CORONARY STENT PLACEMENT    . ESOPHAGOGASTRODUODENOSCOPY (EGD) WITH PROPOFOL N/A 05/26/2016   Procedure: ESOPHAGOGASTRODUODENOSCOPY (EGD) WITH PROPOFOL;  Surgeon: Lucilla Lame, MD;  Location: [facility] ENDOSCOPY;  Service: Endoscopy;  Laterality: N/A;  . GIVENS CAPSULE STUDY N/A 05/05/2016   Procedure: GIVENS CAPSULE STUDY;  Surgeon: Lucilla Lame, MD;  Location: [facility] ENDOSCOPY;  Service: Endoscopy;  Laterality: N/A;  . REDUCTION MAMMAPLASTY    . REPLACEMENT TOTAL KNEE    . SHOULDER SURGERY    . TIBIA FRACTURE SURGERY      Family History  Problem Relation Age of Onset  . Lung cancer Maternal Aunt   . Breast cancer Maternal Aunt 57  . Other Mother     Killed in car accident, 84  . Other Father     Killed in car accident, 66  . Thyroid disease Brother   . Thyroid disease Sister     Social History:  reports that she quit smoking about 4 years ago. Her smoking use included Cigarettes. She has a 94.00 pack-year smoking history. She has never used smokeless tobacco. She  reports that she does not drink alcohol or use drugs.  She lives in [place].  She is alone today.  Allergies: No Known Allergies  Current Medications: Current Outpatient Prescriptions  Medication Sig Dispense Refill  . albuterol (PROVENTIL HFA;VENTOLIN HFA) 108 (90 Base) MCG/ACT inhaler Inhale 2 puffs into the lungs every 6 (six) hours as needed for wheezing. 6.7 g 5  . aspirin 81 MG tablet Take 81 mg by mouth daily.    . budesonide-formoterol (SYMBICORT) 160-4.5 MCG/ACT inhaler Inhale 1 puff into the lungs 2 (two) times daily. 1 Inhaler 5  . clonazePAM (KLONOPIN) 0.5 MG tablet Take 0.5 mg by mouth at bedtime.     .  ferrous fumarate (HEMOCYTE - 106 MG FE) 325 (106 FE) MG TABS tablet Take 1 tablet by mouth daily.    Marland Kitchen gabapentin (NEURONTIN) 300 MG capsule Take 1 capsule (300 mg total) by mouth 3 (three) times daily. 270 capsule 3  . glucose blood (COOL BLOOD GLUCOSE TEST STRIPS) test strip Use as instructed 100 each 12  . levothyroxine (SYNTHROID, LEVOTHROID) 100 MCG tablet Take 1 tablet (100 mcg total) by mouth daily. Brand name required 30 tablet 1  . lisinopril (PRINIVIL,ZESTRIL) 5 MG tablet Take 1 tablet (5 mg total) by mouth daily. 90 tablet 1  . metFORMIN (GLUCOPHAGE) 500 MG tablet Take 2 tablets (1,000 mg total) by mouth 2 (two) times daily with a meal. 360 tablet 1  . Omega-3 Fatty Acids (FISH OIL) 1200 MG CAPS Take 1 capsule by mouth daily.    . rosuvastatin (CRESTOR) 40 MG tablet Take 1 tablet (40 mg total) by mouth daily. 90 tablet 1  . SUMAtriptan (IMITREX) 100 MG tablet Take 100 mg by mouth every 2 (two) hours as needed for migraine. May repeat in 2 hours if headache persists or recurs.    . nortriptyline (PAMELOR) 25 MG capsule Take 1 capsule (25 mg total) by mouth at bedtime. (Patient not taking: Reported on 09/03/2016) 90 capsule 1   No current facility-administered medications for this visit.     Review of Systems:  GENERAL:  Feels "horrible".  No fevers or sweats.  Weight up 5 pounds. PERFORMANCE STATUS (ECOG):  2 HEENT:  No visual changes, runny nose, sore throat, mouth sores or tenderness. Lungs:  COPD.  Acute on chronic shortness of breath.  Rare cough.  No pleuritic chest pain.  No hemoptysis.  Uses CPAP. Cardiac:  No chest pain, palpitations, or PND. Stress test and EKG negative. GI:  No nausea, vomiting, diarrhea, constipation, melena or hematochezia. GU:  No urgency, frequency, dysuria, or hematuria. Musculoskeletal:  No back pain.  No joint pain.  No muscle tenderness. Extremities:  No pain or swelling. Skin:  No rashes or skin changes. Neuro:  Headaches, left side of head.   Myoclonic dystonia.  No numbness or weakness, balance or coordination issues. Endocrine:  Hypothyroid on Synthroid.  Diabetes.  No hot flashes or night sweats. Psych:  No mood changes, depression or anxiety. Pain:  No focal pain. Review of systems:  All other systems reviewed and found to be negative.   Physical Exam: Blood pressure 136/74, pulse 93, temperature 98 F (36.7 C), temperature source Tympanic, resp. rate 18, weight 205 lb 0.4 oz (93 kg).  GENERAL:  Well developed, well nourished, woman sitting comfortably in the exam room in no acute distress. MENTAL STATUS:  Alert and oriented to person, place and time. HEAD:  Short gray hair.  Normocephalic, atraumatic, face symmetric, no Cushingoid features. EYES:  Blue eyes.  Pupils equal round and reactive to light and accomodation.  No conjunctivitis or scleral icterus. ENT:  Speaks in short sentences secondary to breathing issues (chronic).  Oropharynx clear without lesion.  Partial.  Tongue normal.  Mucous membranes moist.  RESPIRATORY:  Clear to auscultation without rales, wheezes or rhonchi.  CARDIOVASCULAR:  Regular rate and rhythm without murmur, rub or gallop. ABDOMEN:  Soft, non-tender, with active bowel sounds, and no appreciable hepatosplenomegaly.  No masses. SKIN:  No rashes, ulcers or lesions. EXTREMITIES: No edema, no skin discoloration or tenderness.  No palpable cords. LYMPH NODES: No palpable cervical, supraclavicular, axillary or inguinal adenopathy  NEUROLOGICAL: Unremarkable. PSYCH:  Appropriate.    Appointment on 10/28/2016  Component Date Value Ref Range Status  . WBC 10/28/2016 6.7  3.6 - 11.0 K/uL Final  . RBC 10/28/2016 4.02  3.80 - 5.20 MIL/uL Final  . Hemoglobin 10/28/2016 11.8* 12.0 - 16.0 g/dL Final  . HCT 10/28/2016 35.0  35.0 - 47.0 % Final  . MCV 10/28/2016 87.1  80.0 - 100.0 fL Final  . MCH 10/28/2016 29.4  26.0 - 34.0 pg Final  . MCHC 10/28/2016 33.7  32.0 - 36.0 g/dL Final  . RDW 10/28/2016 13.1   11.5 - 14.5 % Final  . Platelets 10/28/2016 274  150 - 440 K/uL Final  . Neutrophils Relative % 10/28/2016 52  % Final  . Neutro Abs 10/28/2016 3.5  1.4 - 6.5 K/uL Final  . Lymphocytes Relative 10/28/2016 35  % Final  . Lymphs Abs 10/28/2016 2.3  1.0 - 3.6 K/uL Final  . Monocytes Relative 10/28/2016 8  % Final  . Monocytes Absolute 10/28/2016 0.5  0.2 - 0.9 K/uL Final  . Eosinophils Relative 10/28/2016 4  % Final  . Eosinophils Absolute 10/28/2016 0.3  0 - 0.7 K/uL Final  . Basophils Relative 10/28/2016 1  % Final  . Basophils Absolute 10/28/2016 0.1  0 - 0.1 K/uL Final  . Ferritin 10/28/2016 10* 11 - 307 ng/mL Final    Assessment:  Leslie Olsen is an 66 y.o. female with recurrent iron deficiency anemia secondary to AVMs..    EGD on 06/05/2014 revealed a hiatal hernia and gastritis.  Colonoscopy on 06/05/2014 noted revealed a poor prep.  There was a 7 mm polyp in the transverse colon and non-bleeding internal hemorrhoids.  Capsule study on 05/26/2016 revealed   Multiple angiodysplastic lesion with bleeding in the entire duodenum and  jejunum.  She had grade A esophagitis.  Her diet is good.  She denies any melena or hematochezia.  She denies any uterine bleeding.    She has a 90 pack year smoking history.  She has COPD.  Chest CT on 08/23/2013 revealed a questionable lesion at the pleura.  Chest CT on 08/23/2014 suggested benign etiology with similar scattered pulmonary nodules, unchanged ground glass opacity posterior right upper lobe, subtle subpleural reticulation, and similar low density enlargement of both adrenals.  CXR on 11/27/2015 revealed no active cardiopulmonary disease.  She has mild stable COPD per Dr. Stevenson Clinch on 01/07/2016.  Cardiology evaluation revealed a normal exercise nuclear stress test on 12/18/2015 and echocardiogram on 11/2015 (EF 60-65%).  She has sleep apnea and is using her CPAP again.  She was on oral iron x 2 years.  She developed recurrent iron deficiency  anemia.  She denies any melena or hematochezia.  She received Venofer 600 mg (10/29/2015 - 11/12/2015) and 400 mg (01/29/2016 - 02/05/2016) and 200 mg (04/23/2016).  Popcorn and ice  pica have resolved.    Ferritin has been followed: 15 on 01/14/2015, 7 on 10/22/2015, 73 on 11/19/2015, 15 on 01/22/2016, 47 on 03/04/2016, 18 on 04/16/2016, 17 on 07/14/2016, 25 on 09/03/2016, and 10 on 10/28/2016.  Patient receives Venofer if her ferritin is < 30.  She has myoclonic dystonia.  She is on Klonopin.  Symptomatically, she has acute on chronic shortness of breath.  Exam is stable.   Hematocrit is 35.0.  Ferritin is 10.  Plan: 1.  Review labs from yesterday.  Discuss trend in ferritin.  Discuss Venofer x2. 2.  Venofer today. 3.  RTC in 1 week for Venofer 4.  RTC in 2 months for labs (CBC, ferritin) 5.  RTC in 4 months for MD assessment, labs (CBC with diff, ferritin-day before) +/- Venofer  The patient was seen and examined.  The assessment and plan was discussed with the patient.  Multiple questions were asked and answered.   Faythe Casa, NP  Lequita Asal, MD  10/30/2016 , 2:30 PM

## 2016-10-29 NOTE — Progress Notes (Signed)
Patient states she has been seen @ [facility] for myoclonic dystonia. She was placed on Klonopin which helped her in the beginning. Klonopin has been increased to 3 times a day and she is no better   Also states since she was last here she had [facility] Spotted Tick Fever.  States she took 21 days of antibiotics.

## 2016-11-03 ENCOUNTER — Inpatient Hospital Stay (HOSPITAL_BASED_OUTPATIENT_CLINIC_OR_DEPARTMENT_OTHER): Payer: PPO | Admitting: Oncology

## 2016-11-03 ENCOUNTER — Ambulatory Visit
Admission: RE | Admit: 2016-11-03 | Discharge: 2016-11-03 | Disposition: A | Discharge status: Home / Self Care | Payer: PPO | Source: Ambulatory Visit | Attending: Oncology | Admitting: Oncology

## 2016-11-03 ENCOUNTER — Encounter: Payer: Self-pay | Admitting: Oncology

## 2016-11-03 DIAGNOSIS — Z87891 Personal history of nicotine dependence: Secondary | ICD-10-CM | POA: Insufficient documentation

## 2016-11-03 DIAGNOSIS — Z122 Encounter for screening for malignant neoplasm of respiratory organs: Secondary | ICD-10-CM | POA: Diagnosis not present

## 2016-11-03 NOTE — Progress Notes (Signed)
In accordance with CMS guidelines, patient has met eligibility criteria including age, absence of signs or symptoms of lung cancer.  Social History  Substance Use Topics  . Smoking status: Former Smoker    Packs/day: 2.00    Years: 45.00    Types: Cigarettes    Quit date: 11/06/2011  . Smokeless tobacco: Never Used  . Alcohol use No     A shared decision-making session was conducted prior to the performance of CT scan. This includes one or more decision aids, includes benefits and harms of screening, follow-up diagnostic testing, over-diagnosis, false positive rate, and total radiation exposure.  Counseling on the importance of adherence to annual lung cancer LDCT screening, impact of co-morbidities, and ability or willingness to undergo diagnosis and treatment is imperative for compliance of the program.  Counseling on the importance of continued smoking cessation for former smokers; the importance of smoking cessation for current smokers, and information about tobacco cessation interventions have been given to patient including [place] and 1800 quit [place] programs.  Written order for lung cancer screening with LDCT has been given to the patient and any and all questions have been answered to the best of my abilities.   Yearly follow up will be coordinated by Burgess Estelle, Thoracic Navigator.  Faythe Casa, NP

## 2016-11-04 ENCOUNTER — Other Ambulatory Visit: Payer: Self-pay | Admitting: Oncology

## 2016-11-05 ENCOUNTER — Inpatient Hospital Stay: Payer: PPO | Attending: Oncology

## 2016-11-05 ENCOUNTER — Other Ambulatory Visit: Payer: Self-pay | Admitting: Hematology and Oncology

## 2016-11-05 VITALS — BP 116/71 | HR 93 | Temp 97.0°F | Resp 18

## 2016-11-05 DIAGNOSIS — D509 Iron deficiency anemia, unspecified: Secondary | ICD-10-CM | POA: Diagnosis not present

## 2016-11-05 DIAGNOSIS — Q2739 Arteriovenous malformation, other site: Secondary | ICD-10-CM | POA: Diagnosis not present

## 2016-11-05 MED ORDER — IRON SUCROSE 20 MG/ML IV SOLN
200.0000 mg | Freq: Once | INTRAVENOUS | Status: AC
  Administered 2016-11-05: 200 mg via INTRAVENOUS
  Filled 2016-11-05: qty 10

## 2016-11-05 MED ORDER — SODIUM CHLORIDE 0.9 % IV SOLN: 200.0000 mg | Freq: Once | INTRAVENOUS | Status: DC

## 2016-11-05 MED ORDER — SODIUM CHLORIDE 0.9 % IV SOLN
Freq: Once | INTRAVENOUS | Status: AC
  Administered 2016-11-05: 14:00:00 via INTRAVENOUS
  Filled 2016-11-05: qty 1000

## 2016-11-06 ENCOUNTER — Encounter: Payer: Self-pay | Admitting: *Deleted

## 2016-11-24 ENCOUNTER — Other Ambulatory Visit: Payer: Self-pay | Admitting: *Deleted

## 2016-11-24 ENCOUNTER — Telehealth: Payer: Self-pay | Admitting: *Deleted

## 2016-11-24 DIAGNOSIS — D508 Other iron deficiency anemias: Secondary | ICD-10-CM

## 2016-11-24 NOTE — Telephone Encounter (Signed)
Called patient and LVM that her ferritin and hct is lower since her last lab draw.  Per MD patient will need IV venofer this month. Scheduling has made an appointment for this Friday, March 23 @ 1:30 pm. Patient will also have labs prior to infusion to determine how many treatments she will need.

## 2016-11-24 NOTE — Telephone Encounter (Signed)
-----   Message from Lequita Asal, MD sent at 11/24/2016  6:37 AM EDT ----- Regarding: Please call patient  Hematocrit at last check was normal, but slightly down (37.5 to 35).  Ferritin was 10 (previously 25).  Will need Venofer this month.  Check CBC and ferritin prior to infusion to determine # doses needed.  M  ----- Message ----- From: Interface, Lab In [place] Sent: 10/28/2016   1:35 PM To: Lequita Asal, MD

## 2016-11-25 ENCOUNTER — Telehealth: Payer: Self-pay

## 2016-11-25 NOTE — Telephone Encounter (Signed)
LVM Patient does need to come to scheduled appt on Fri 3/23 @ 1:30 for labs/infusion. Please call with any questions or concerns about appt .

## 2016-11-27 ENCOUNTER — Inpatient Hospital Stay: Payer: PPO

## 2016-11-27 VITALS — BP 110/73 | HR 92 | Temp 98.2°F | Resp 20

## 2016-11-27 DIAGNOSIS — D509 Iron deficiency anemia, unspecified: Secondary | ICD-10-CM | POA: Diagnosis not present

## 2016-11-27 DIAGNOSIS — D508 Other iron deficiency anemias: Secondary | ICD-10-CM

## 2016-11-27 LAB — CBC WITH DIFFERENTIAL/PLATELET
Basophils Absolute: 0.1 10*3/uL (ref 0–0.1)
Basophils Relative: 1 %
Eosinophils Absolute: 0.2 10*3/uL (ref 0–0.7)
Eosinophils Relative: 4 %
HCT: 39.2 % (ref 35.0–47.0)
Hemoglobin: 13.6 g/dL (ref 12.0–16.0)
Lymphocytes Relative: 34 %
Lymphs Abs: 1.8 10*3/uL (ref 1.0–3.6)
MCH: 30.4 pg (ref 26.0–34.0)
MCHC: 34.6 g/dL (ref 32.0–36.0)
MCV: 87.9 fL (ref 80.0–100.0)
Monocytes Absolute: 0.4 10*3/uL (ref 0.2–0.9)
Monocytes Relative: 8 %
Neutro Abs: 2.9 10*3/uL (ref 1.4–6.5)
Neutrophils Relative %: 53 %
Platelets: 282 10*3/uL (ref 150–440)
RBC: 4.46 MIL/uL (ref 3.80–5.20)
RDW: 15.1 % — ABNORMAL HIGH (ref 11.5–14.5)
WBC: 5.5 10*3/uL (ref 3.6–11.0)

## 2016-11-27 LAB — FERRITIN: Ferritin: 47 ng/mL (ref 11–307)

## 2016-11-27 MED ORDER — SODIUM CHLORIDE 0.9 % IV SOLN: 200.0000 mg | Freq: Once | INTRAVENOUS | Status: DC

## 2016-11-27 MED ORDER — SODIUM CHLORIDE 0.9 % IV SOLN
Freq: Once | INTRAVENOUS | Status: AC
  Administered 2016-11-27: 14:00:00 via INTRAVENOUS
  Filled 2016-11-27: qty 1000

## 2016-11-27 MED ORDER — IRON SUCROSE 20 MG/ML IV SOLN
200.0000 mg | Freq: Once | INTRAVENOUS | Status: AC
  Administered 2016-11-27: 200 mg via INTRAVENOUS
  Filled 2016-11-27: qty 10

## 2016-12-01 ENCOUNTER — Inpatient Hospital Stay: Payer: PPO

## 2016-12-02 ENCOUNTER — Ambulatory Visit (INDEPENDENT_AMBULATORY_CARE_PROVIDER_SITE_OTHER): Payer: PPO | Admitting: Family Medicine

## 2016-12-02 ENCOUNTER — Encounter: Payer: Self-pay | Admitting: Family Medicine

## 2016-12-02 VITALS — BP 109/70 | HR 75 | Temp 98.0°F | Resp 17 | Ht 66.0 in | Wt 199.0 lb

## 2016-12-02 DIAGNOSIS — R1032 Left lower quadrant pain: Secondary | ICD-10-CM

## 2016-12-02 DIAGNOSIS — R634 Abnormal weight loss: Secondary | ICD-10-CM

## 2016-12-02 DIAGNOSIS — R194 Change in bowel habit: Secondary | ICD-10-CM | POA: Diagnosis not present

## 2016-12-02 DIAGNOSIS — D509 Iron deficiency anemia, unspecified: Secondary | ICD-10-CM

## 2016-12-02 DIAGNOSIS — E039 Hypothyroidism, unspecified: Secondary | ICD-10-CM | POA: Diagnosis not present

## 2016-12-02 DIAGNOSIS — E119 Type 2 diabetes mellitus without complications: Secondary | ICD-10-CM | POA: Diagnosis not present

## 2016-12-02 LAB — BAYER DCA HB A1C WAIVED: HB A1C (BAYER DCA - WAIVED): 7 % — ABNORMAL HIGH (ref <7.0)

## 2016-12-02 NOTE — Progress Notes (Signed)
BP 109/70 (BP Location: Left Arm, Patient Position: Sitting, Cuff Size: Large)   Pulse 75   Temp 98 F (36.7 C) (Oral)   Resp 17   Ht 5\' 6"  (1.676 m)   Wt 199 lb (90.3 kg)   SpO2 98%   BMI 32.12 kg/m    Subjective:    Patient ID: Leslie Olsen, female    DOB: 1950/11/04, 66 y.o.   MRN: [id number]  HPI: Leslie Olsen is a 66 y.o. female  Chief Complaint  Patient presents with  . Diabetes   Went to see GI doctor from [facility], [facility] and he was happy with her blood. She went to the cancer center and her ferritin level was 10. She had 2 infusions. Levels are doing better. She is still feeling terrible.  Past week- 10 days, thought of food is making her feel nauseous. The thought of food makes her feel nauseous and then when she does eat, she feels nauseous. She ate a little bit and then she had an episode of bowel incontinence. Had a sharp pain in her lower abdomen and an episode of bowel urgency. No diarrhea or constipation. She notes that she has a pain in her LLQ and then feels like something squeezes and she notes that her stools looks skinny and long and like it's been squeezed. This is worrying her. She is afraid something is not right and doesn't know what it is.   DIABETES Hypoglycemic episodes:no Polydipsia/polyuria: no Visual disturbance: no Chest pain: no Paresthesias: no Glucose Monitoring: no Taking Insulin?: no Blood Pressure Monitoring: not checking Retinal Examination: Up to Date Foot Exam: Up to Date Diabetic Education: Completed Pneumovax: Up to Date Influenza: Up to Date Aspirin: no  HYPOTHYROIDISM Thyroid control status:controlled Satisfied with current treatment? yes Medication side effects: no Medication compliance: excellent compliance Recent dose adjustment:yes Fatigue: yes Cold intolerance: yes Heat intolerance: no Weight gain: no Weight loss: yes Constipation: yes Diarrhea/loose stools: no Palpitations: yes Lower extremity edema:  no Anxiety/depressed mood: yes  Relevant past medical, surgical, family and social history reviewed and updated as indicated. Interim medical history since our last visit reviewed. Allergies and medications reviewed and updated.  Review of Systems  Constitutional: Negative.   Respiratory: Positive for chest tightness. Negative for apnea, cough, choking, shortness of breath, wheezing and stridor.   Cardiovascular: Negative.   Gastrointestinal: Positive for abdominal pain, constipation and nausea. Negative for abdominal distention, anal bleeding, blood in stool, diarrhea, rectal pain and vomiting.  Skin: Negative.   Psychiatric/Behavioral: Negative.    Per HPI unless specifically indicated above     Objective:    BP 109/70 (BP Location: Left Arm, Patient Position: Sitting, Cuff Size: Large)   Pulse 75   Temp 98 F (36.7 C) (Oral)   Resp 17   Ht 5\' 6"  (1.676 m)   Wt 199 lb (90.3 kg)   SpO2 98%   BMI 32.12 kg/m   Wt Readings from Last 3 Encounters:  12/02/16 199 lb (90.3 kg)  11/03/16 204 lb (92.5 kg)  10/29/16 205 lb 0.4 oz (93 kg)    Physical Exam  Constitutional: She is oriented to person, place, and time. She appears well-developed and well-nourished. No distress.  HENT:  Head: Normocephalic and atraumatic.  Right Ear: Hearing normal.  Left Ear: Hearing normal.  Nose: Nose normal.  Eyes: Conjunctivae and lids are normal. Right eye exhibits no discharge. Left eye exhibits no discharge. No scleral icterus.  Cardiovascular: Normal rate, regular rhythm, normal  heart sounds and intact distal pulses.  Exam reveals no gallop and no friction rub.   No murmur heard. Pulmonary/Chest: Effort normal and breath sounds normal. No respiratory distress. She has no wheezes. She has no rales. She exhibits no tenderness.  Abdominal: Soft. Bowel sounds are normal. She exhibits no distension and no mass. There is no tenderness. There is no rebound and no guarding.  Musculoskeletal: Normal  range of motion.  Neurological: She is alert and oriented to person, place, and time.  Skin: Skin is warm, dry and intact. No rash noted. No erythema. No pallor.  Psychiatric: She has a normal mood and affect. Her speech is normal and behavior is normal. Judgment and thought content normal. Cognition and memory are normal.  Nursing note and vitals reviewed.   Results for orders placed or performed in visit on 12/02/16  Bayer DCA Hb A1c Waived  Result Value Ref Range   Bayer DCA Hb A1c Waived 7.0 (H) <7.0 %      Assessment & Plan:   Problem List Items Addressed This Visit      Endocrine   Diabetes mellitus without complication ([place]) - Primary    A1c at 7.0. Continue current regimen. Continue to monitor.       Relevant Orders   Bayer DCA Hb A1c Waived (Completed)   Thyroid activity decreased    Rechecking levels today. Will await results and adjust dose as needed. Await results.         Other   Iron deficiency anemia    Continue to follow with hematology and GI. Likely due to AVMs. Better after last infusion. Continue to monitor.       Relevant Orders   Comprehensive metabolic panel   CT Abdomen Pelvis W Contrast    Other Visit Diagnoses    Bowel habit changes       Last colonoscopy 2015- normal. Will check CT abdomen and pelvis given constillation of symptoms.    Relevant Orders   Comprehensive metabolic panel   CT Abdomen Pelvis W Contrast   Left lower quadrant pain       Last colonoscopy 2015- normal. Will check CT abdomen and pelvis given constillation of symptoms.    Relevant Orders   Comprehensive metabolic panel   CT Abdomen Pelvis W Contrast   Weight loss       Last colonoscopy 2015- normal. Will check CT abdomen and pelvis given constillation of symptoms.    Relevant Orders   Comprehensive metabolic panel   CT Abdomen Pelvis W Contrast       Follow up plan: Return After CT.

## 2016-12-02 NOTE — Assessment & Plan Note (Signed)
A1c at 7.0. Continue current regimen. Continue to monitor.

## 2016-12-02 NOTE — Patient Instructions (Addendum)

## 2016-12-02 NOTE — Assessment & Plan Note (Signed)
Rechecking levels today. Will await results and adjust dose as needed. Await results.

## 2016-12-02 NOTE — Assessment & Plan Note (Addendum)
Continue to follow with hematology and GI. Likely due to AVMs. Better after last infusion. Continue to monitor.

## 2016-12-03 ENCOUNTER — Encounter: Payer: Self-pay | Admitting: Family Medicine

## 2016-12-03 DIAGNOSIS — E119 Type 2 diabetes mellitus without complications: Secondary | ICD-10-CM | POA: Diagnosis not present

## 2016-12-03 LAB — COMPREHENSIVE METABOLIC PANEL
ALK PHOS: 90 IU/L (ref 39–117)
ALT: 18 IU/L (ref 0–32)
AST: 23 IU/L (ref 0–40)
Albumin/Globulin Ratio: 1.6 (ref 1.2–2.2)
Albumin: 4.5 g/dL (ref 3.6–4.8)
BUN / CREAT RATIO: 13 (ref 12–28)
BUN: 11 mg/dL (ref 8–27)
Bilirubin Total: 0.4 mg/dL (ref 0.0–1.2)
CO2: 23 mmol/L (ref 18–29)
CREATININE: 0.84 mg/dL (ref 0.57–1.00)
Calcium: 9.3 mg/dL (ref 8.7–10.3)
Chloride: 99 mmol/L (ref 96–106)
GFR calc Af Amer: 83 mL/min/{1.73_m2} (ref >59)
GFR calc non Af Amer: 72 mL/min/{1.73_m2} (ref >59)
GLUCOSE: 131 mg/dL — AB (ref 65–99)
Globulin, Total: 2.9 g/dL (ref 1.5–4.5)
Potassium: 4.4 mmol/L (ref 3.5–5.2)
Sodium: 137 mmol/L (ref 134–144)
Total Protein: 7.4 g/dL (ref 6.0–8.5)

## 2016-12-03 LAB — TSH: TSH: 1.17 u[IU]/mL (ref 0.450–4.500)

## 2016-12-03 LAB — HM DIABETES EYE EXAM

## 2016-12-03 MED ORDER — LEVOTHYROXINE SODIUM 100 MCG PO TABS: 100.0000 ug | ORAL_TABLET | Freq: Every day | ORAL | 12 refills | Status: DC

## 2016-12-07 DIAGNOSIS — Z6831 Body mass index (BMI) 31.0-31.9, adult: Secondary | ICD-10-CM | POA: Diagnosis not present

## 2016-12-07 DIAGNOSIS — G253 Myoclonus: Secondary | ICD-10-CM | POA: Diagnosis not present

## 2016-12-08 ENCOUNTER — Ambulatory Visit: Admission: RE | Admit: 2016-12-08 | Payer: PPO | Source: Ambulatory Visit

## 2016-12-08 ENCOUNTER — Telehealth: Payer: Self-pay | Admitting: Family Medicine

## 2016-12-08 NOTE — Telephone Encounter (Signed)
Called Leslie Olsen back. Appointment was canceled yesterday. I sent over Pre Cert last week to [employer].  Called this morning. They explained it was still in the nurse review process. Sent the Pre Cert last week.  Asked if there was anything I could and they said no, we'd hear something back by the end of this week.  Relayed this message to Leslie Olsen. Also sent a message to Gaetano Hawthorne regarding this. She's the one who tried to contact patient to cancel appointment.

## 2016-12-08 NOTE — Telephone Encounter (Signed)
Call Rob [phone] regarding auth on patient  Thanks

## 2016-12-08 NOTE — Telephone Encounter (Signed)
Refer to other phone call from 12/08/16.  Spoke with Dr. Wynetta Emery and explained the situation to patient.  Patient said she understood, but no one told her of the appointment or left a voicemail even though it's documented in the appointment.  I explained to patient I was sorry for the confusion. To call us if she had any further questions or concerns.

## 2016-12-08 NOTE — Telephone Encounter (Signed)
Patient called to let Dr Wynetta Emery know she had scheduled an appt for 4/5 with [facility]. She was very upset that she had gone to the appt for her CT and the appt was cancelled on her.  She just wanted Dr Wynetta Emery to be aware of this.

## 2016-12-10 DIAGNOSIS — R630 Anorexia: Secondary | ICD-10-CM | POA: Diagnosis not present

## 2016-12-10 DIAGNOSIS — R110 Nausea: Secondary | ICD-10-CM | POA: Diagnosis not present

## 2016-12-10 DIAGNOSIS — R6881 Early satiety: Secondary | ICD-10-CM | POA: Diagnosis not present

## 2016-12-10 DIAGNOSIS — R1011 Right upper quadrant pain: Secondary | ICD-10-CM | POA: Diagnosis not present

## 2016-12-14 ENCOUNTER — Telehealth: Payer: Self-pay

## 2016-12-14 NOTE — Telephone Encounter (Signed)
Patient had a CT scan scheduled last week. The approval was still pending, and it's documented that [facility] tried to call patient to cancel appointment and LVM. I called [employer] and asked the status of the CT Scan and they said it was in nurse review. Asked if there was anything I could do to speed up the process and was told no. Patient called and said she never got the VM and nobody called her. She arrived to her appointment. Patient was upset.  I received the approval letter this morning from [employer]. Rescheduled patient's CT appointment.  Contacted patient. Patient is going to [place] at [facility] and they have a CT scheduled for her. Patient said she's not going back to [facility] for those services anymore.  Explained to patient I'd let Dr. Wynetta Emery know and to call us with any other questions or concerns. Patient understood.

## 2016-12-14 NOTE — Telephone Encounter (Signed)
Noted, as long as she's getting CT, that's all that matters.

## 2016-12-16 DIAGNOSIS — R1011 Right upper quadrant pain: Secondary | ICD-10-CM | POA: Diagnosis not present

## 2016-12-16 DIAGNOSIS — K769 Liver disease, unspecified: Secondary | ICD-10-CM | POA: Diagnosis not present

## 2016-12-16 DIAGNOSIS — R109 Unspecified abdominal pain: Secondary | ICD-10-CM | POA: Diagnosis not present

## 2016-12-16 DIAGNOSIS — R634 Abnormal weight loss: Secondary | ICD-10-CM | POA: Diagnosis not present

## 2016-12-16 DIAGNOSIS — R110 Nausea: Secondary | ICD-10-CM | POA: Diagnosis not present

## 2016-12-18 ENCOUNTER — Ambulatory Visit: Payer: PPO

## 2016-12-28 ENCOUNTER — Other Ambulatory Visit: Payer: PPO

## 2016-12-29 ENCOUNTER — Inpatient Hospital Stay: Payer: PPO | Attending: Hematology and Oncology

## 2016-12-29 DIAGNOSIS — G248 Other dystonia: Secondary | ICD-10-CM | POA: Diagnosis not present

## 2016-12-29 DIAGNOSIS — D508 Other iron deficiency anemias: Secondary | ICD-10-CM | POA: Diagnosis not present

## 2016-12-29 DIAGNOSIS — Q2739 Arteriovenous malformation, other site: Secondary | ICD-10-CM | POA: Diagnosis not present

## 2016-12-29 DIAGNOSIS — D509 Iron deficiency anemia, unspecified: Secondary | ICD-10-CM

## 2016-12-29 LAB — CBC WITH DIFFERENTIAL/PLATELET
Basophils Absolute: 0.1 10*3/uL (ref 0–0.1)
Basophils Relative: 2 %
Eosinophils Absolute: 0.1 10*3/uL (ref 0–0.7)
Eosinophils Relative: 3 %
HCT: 38.6 % (ref 35.0–47.0)
Hemoglobin: 13.3 g/dL (ref 12.0–16.0)
Lymphocytes Relative: 34 %
Lymphs Abs: 2 10*3/uL (ref 1.0–3.6)
MCH: 30.8 pg (ref 26.0–34.0)
MCHC: 34.4 g/dL (ref 32.0–36.0)
MCV: 89.5 fL (ref 80.0–100.0)
Monocytes Absolute: 0.4 10*3/uL (ref 0.2–0.9)
Monocytes Relative: 7 %
Neutro Abs: 3.1 10*3/uL (ref 1.4–6.5)
Neutrophils Relative %: 54 %
Platelets: 256 10*3/uL (ref 150–440)
RBC: 4.32 MIL/uL (ref 3.80–5.20)
RDW: 15.6 % — ABNORMAL HIGH (ref 11.5–14.5)
WBC: 5.7 10*3/uL (ref 3.6–11.0)

## 2016-12-29 LAB — FERRITIN: Ferritin: 71 ng/mL (ref 11–307)

## 2017-01-05 DIAGNOSIS — K769 Liver disease, unspecified: Secondary | ICD-10-CM | POA: Diagnosis not present

## 2017-01-20 DIAGNOSIS — R17 Unspecified jaundice: Secondary | ICD-10-CM | POA: Diagnosis not present

## 2017-01-20 DIAGNOSIS — R634 Abnormal weight loss: Secondary | ICD-10-CM | POA: Diagnosis not present

## 2017-01-20 DIAGNOSIS — E039 Hypothyroidism, unspecified: Secondary | ICD-10-CM | POA: Diagnosis not present

## 2017-01-20 DIAGNOSIS — R5383 Other fatigue: Secondary | ICD-10-CM | POA: Diagnosis not present

## 2017-01-20 DIAGNOSIS — R6881 Early satiety: Secondary | ICD-10-CM | POA: Diagnosis not present

## 2017-01-20 DIAGNOSIS — R110 Nausea: Secondary | ICD-10-CM | POA: Diagnosis not present

## 2017-01-20 DIAGNOSIS — R768 Other specified abnormal immunological findings in serum: Secondary | ICD-10-CM | POA: Diagnosis not present

## 2017-01-20 DIAGNOSIS — R1011 Right upper quadrant pain: Secondary | ICD-10-CM | POA: Diagnosis not present

## 2017-01-21 DIAGNOSIS — E785 Hyperlipidemia, unspecified: Secondary | ICD-10-CM | POA: Diagnosis not present

## 2017-01-21 DIAGNOSIS — E119 Type 2 diabetes mellitus without complications: Secondary | ICD-10-CM | POA: Diagnosis not present

## 2017-01-21 DIAGNOSIS — D124 Benign neoplasm of descending colon: Secondary | ICD-10-CM | POA: Diagnosis not present

## 2017-01-21 DIAGNOSIS — I10 Essential (primary) hypertension: Secondary | ICD-10-CM | POA: Diagnosis not present

## 2017-01-21 DIAGNOSIS — E039 Hypothyroidism, unspecified: Secondary | ICD-10-CM | POA: Diagnosis not present

## 2017-01-21 DIAGNOSIS — Z955 Presence of coronary angioplasty implant and graft: Secondary | ICD-10-CM | POA: Diagnosis not present

## 2017-01-21 DIAGNOSIS — K921 Melena: Secondary | ICD-10-CM | POA: Diagnosis not present

## 2017-01-21 DIAGNOSIS — D509 Iron deficiency anemia, unspecified: Secondary | ICD-10-CM | POA: Diagnosis not present

## 2017-01-21 DIAGNOSIS — I491 Atrial premature depolarization: Secondary | ICD-10-CM | POA: Diagnosis not present

## 2017-01-21 DIAGNOSIS — D126 Benign neoplasm of colon, unspecified: Secondary | ICD-10-CM | POA: Diagnosis not present

## 2017-01-21 DIAGNOSIS — Z79899 Other long term (current) drug therapy: Secondary | ICD-10-CM | POA: Diagnosis not present

## 2017-01-21 DIAGNOSIS — K5730 Diverticulosis of large intestine without perforation or abscess without bleeding: Secondary | ICD-10-CM | POA: Diagnosis not present

## 2017-01-21 DIAGNOSIS — Z7984 Long term (current) use of oral hypoglycemic drugs: Secondary | ICD-10-CM | POA: Diagnosis not present

## 2017-01-21 DIAGNOSIS — I2510 Atherosclerotic heart disease of native coronary artery without angina pectoris: Secondary | ICD-10-CM | POA: Diagnosis not present

## 2017-01-21 DIAGNOSIS — D123 Benign neoplasm of transverse colon: Secondary | ICD-10-CM | POA: Diagnosis not present

## 2017-01-21 DIAGNOSIS — B1920 Unspecified viral hepatitis C without hepatic coma: Secondary | ICD-10-CM | POA: Diagnosis not present

## 2017-01-21 DIAGNOSIS — G4730 Sleep apnea, unspecified: Secondary | ICD-10-CM | POA: Diagnosis not present

## 2017-01-21 DIAGNOSIS — J449 Chronic obstructive pulmonary disease, unspecified: Secondary | ICD-10-CM | POA: Diagnosis not present

## 2017-01-21 DIAGNOSIS — K5520 Angiodysplasia of colon without hemorrhage: Secondary | ICD-10-CM | POA: Diagnosis not present

## 2017-01-21 DIAGNOSIS — Z87891 Personal history of nicotine dependence: Secondary | ICD-10-CM | POA: Diagnosis not present

## 2017-01-28 LAB — HM COLONOSCOPY

## 2017-02-04 DIAGNOSIS — E0590 Thyrotoxicosis, unspecified without thyrotoxic crisis or storm: Secondary | ICD-10-CM | POA: Diagnosis not present

## 2017-02-04 DIAGNOSIS — Z6830 Body mass index (BMI) 30.0-30.9, adult: Secondary | ICD-10-CM | POA: Diagnosis not present

## 2017-02-16 ENCOUNTER — Encounter: Payer: Self-pay | Admitting: Family Medicine

## 2017-02-16 ENCOUNTER — Ambulatory Visit (INDEPENDENT_AMBULATORY_CARE_PROVIDER_SITE_OTHER): Payer: PPO | Admitting: Family Medicine

## 2017-02-16 VITALS — BP 122/75 | HR 62 | Temp 97.8°F | Wt 188.2 lb

## 2017-02-16 DIAGNOSIS — E119 Type 2 diabetes mellitus without complications: Secondary | ICD-10-CM

## 2017-02-16 DIAGNOSIS — E46 Unspecified protein-calorie malnutrition: Secondary | ICD-10-CM | POA: Insufficient documentation

## 2017-02-16 DIAGNOSIS — D3500 Benign neoplasm of unspecified adrenal gland: Secondary | ICD-10-CM

## 2017-02-16 DIAGNOSIS — K3184 Gastroparesis: Secondary | ICD-10-CM | POA: Diagnosis not present

## 2017-02-16 DIAGNOSIS — D134 Benign neoplasm of liver: Secondary | ICD-10-CM | POA: Diagnosis not present

## 2017-02-16 DIAGNOSIS — I10 Essential (primary) hypertension: Secondary | ICD-10-CM | POA: Diagnosis not present

## 2017-02-16 DIAGNOSIS — J438 Other emphysema: Secondary | ICD-10-CM | POA: Diagnosis not present

## 2017-02-16 DIAGNOSIS — E039 Hypothyroidism, unspecified: Secondary | ICD-10-CM | POA: Diagnosis not present

## 2017-02-16 DIAGNOSIS — G253 Myoclonus: Secondary | ICD-10-CM | POA: Diagnosis not present

## 2017-02-16 DIAGNOSIS — E782 Mixed hyperlipidemia: Secondary | ICD-10-CM | POA: Diagnosis not present

## 2017-02-16 DIAGNOSIS — D509 Iron deficiency anemia, unspecified: Secondary | ICD-10-CM | POA: Diagnosis not present

## 2017-02-16 DIAGNOSIS — E441 Mild protein-calorie malnutrition: Secondary | ICD-10-CM

## 2017-02-16 LAB — BAYER DCA HB A1C WAIVED: HB A1C (BAYER DCA - WAIVED): 5.9 % (ref <7.0)

## 2017-02-16 MED ORDER — METFORMIN HCL 500 MG PO TABS: 1000.0000 mg | ORAL_TABLET | Freq: Every day | ORAL | 1 refills | Status: DC

## 2017-02-16 NOTE — Assessment & Plan Note (Signed)
Starting with temporal wasting. Cannot tolerate ensure. Will get her into nutrition- referral made today. Will start multivitamin. Encouraged liquid diet. Information about gastroparesis diet given to patient today. Continue to monitor closely.

## 2017-02-16 NOTE — Assessment & Plan Note (Signed)
Stable. Continue current regimen. Continue to monitor. Follow up with pulmonology as needed.

## 2017-02-16 NOTE — Assessment & Plan Note (Signed)
Under good control with A1c of 5.9 due to weight loss- will decrease metformin to 1000mg  daily to see if that helps her abdominal pain. Recheck 3 months.

## 2017-02-16 NOTE — Assessment & Plan Note (Signed)
Bilateral- GI wasn't sure if they were contributing to her fatigue and weight loss. Will get her into endocrinology for evaluation. Await their input.

## 2017-02-16 NOTE — Assessment & Plan Note (Signed)
Under good control. Continue current regimen. Continue to monitor. Call with any concerns. 

## 2017-02-16 NOTE — Assessment & Plan Note (Signed)
GI does not seem to think this is a cause of her symptoms. I am not so sure. Information about gastroparesis diet given today. Will get her into nutrition for help in controlling this. Encouraged liquid diet when possible. Will also get her into endocrinology.

## 2017-02-16 NOTE — Progress Notes (Signed)
BP 122/75 (BP Location: Left Arm, Patient Position: Sitting, Cuff Size: Large)   Pulse 62   Temp 97.8 F (36.6 C)   Wt 188 lb 3.2 oz (85.4 kg)   SpO2 99%   BMI 30.38 kg/m    Subjective:    Patient ID: Leslie Olsen, female    DOB: 17-Oct-1950, 66 y.o.   MRN: [id number]  HPI: NAVI EWTON is a 66 y.o. female  Chief Complaint  Patient presents with  . Follow-up    Patient is here to discuss CT results, why she is feeling so bad   Saw GI on 02/04/17- note reviewed today. She has had extensive GI work up and they did not find any cause for her issues. GI thinks that this may be related to her thyroid. Also recently diagnosed with gastroparesis.  MRI of liver showed adenomas, colonoscopy showed sessile serrated adenomas and AVM at the cecum Had scleral icterus- previous exposure to Hep A- all other hepatitis serologies negative.    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   Iron has been steady. Continues to follow with hematology.   DIABETES Hypoglycemic episodes:no Polydipsia/polyuria: no Visual disturbance: no Chest pain: no Paresthesias: no Glucose Monitoring: no Taking Insulin?: no Blood Pressure Monitoring: not checking Retinal Examination: Up to Date Foot Exam: Up to Date Diabetic Education: Completed Pneumovax: Up to Date Influenza: Up to Date Aspirin: yes  HYPERTENSION / HYPERLIPIDEMIA Satisfied with current treatment? yes Duration of hypertension: chronic BP monitoring frequency: not checking BP medication side effects: no Duration of hyperlipidemia: chronic Cholesterol medication side effects: no Cholesterol supplements: none Past cholesterol medications: crestor Medication compliance: good compliance Aspirin: yes Recent stressors: yes Recurrent headaches: yes Visual changes: no Palpitations: no Dyspnea: no Chest pain: no Lower extremity edema: no Dizzy/lightheaded: no  ANEMIA Anemia status: stable Etiology of anemia: iron  deficency from AVMs Duration of anemia treatment: year + Compliance with treatment: good compliance Iron supplementation side effects: no Severity of anemia: moderate Fatigue: yes Decreased exercise tolerance: yes  Dyspnea on exertion: no Palpitations: no Bleeding: no Pica: no  Relevant past medical, surgical, family and social history reviewed and updated as indicated. Interim medical history since our last visit reviewed. Allergies and medications reviewed and updated.  Review of Systems  Constitutional: Positive for fatigue. Negative for activity change, appetite change, chills, diaphoresis, fever and unexpected weight change.  Eyes: Negative.   Respiratory: Negative.   Cardiovascular: Negative.   Gastrointestinal: Positive for abdominal pain. Negative for abdominal distention, anal bleeding, blood in stool, constipation, diarrhea, nausea, rectal pain and vomiting.  Musculoskeletal: Negative.   Neurological: Negative.   Psychiatric/Behavioral: Negative.     Per HPI unless specifically indicated above     Objective:    BP 122/75 (BP Location: Left Arm, Patient Position: Sitting, Cuff Size: Large)   Pulse 62   Temp 97.8 F (36.6 C)   Wt 188 lb 3.2 oz (85.4 kg)   SpO2 99%   BMI 30.38 kg/m   Wt Readings from Last 3 Encounters:  02/16/17 188 lb 3.2 oz (85.4 kg)  12/02/16 199 lb (90.3 kg)  11/03/16 204 lb (92.5 kg)    Physical Exam  Constitutional: She is oriented to person, place, and time. She appears well-developed and well-nourished. No distress.  Mild Temporal wasting bilaterally  HENT:  Head: Normocephalic and atraumatic.  Right Ear: Hearing normal.  Left Ear: Hearing normal.  Nose: Nose normal.  Eyes: Conjunctivae and lids are normal. Right  eye exhibits no discharge. Left eye exhibits no discharge. No scleral icterus.  Cardiovascular: Normal rate, regular rhythm, normal heart sounds and intact distal pulses.  Exam reveals no gallop and no friction rub.   No  murmur heard. Pulmonary/Chest: Effort normal and breath sounds normal. No respiratory distress. She has no wheezes. She has no rales. She exhibits no tenderness.  Musculoskeletal: Normal range of motion.  Neurological: She is alert and oriented to person, place, and time.  Skin: Skin is warm, dry and intact. No rash noted. She is not diaphoretic. No erythema. No pallor.  Psychiatric: She has a normal mood and affect. Her speech is normal and behavior is normal. Judgment and thought content normal. Cognition and memory are normal.  Nursing note and vitals reviewed.   Results for orders placed or performed in visit on 02/16/17  Bayer DCA Hb A1c Waived  Result Value Ref Range   Bayer DCA Hb A1c Waived 5.9 <7.0 %     MRI done at [facility]: 01/05/17:  Result Impression   1.The previously described lesion seen in segment IVb represents focal fatty infiltration. No suspicious hepatic lesions identified. 2.Bilateral adrenal adenomas.  Result Narrative  EXAM: MRI abdomen with and without contrast DATE: 01/05/2017 8:28 AM ACCESSION: [id number] UN DICTATED: 01/05/2017 8:38 AM INTERPRETATION LOCATION: [place]  CLINICAL INDICATION: 66 years old Female with liver lesion recently seen on prior CT of abdomen.  COMPARISON: CT of the abdomen and pelvis dated 12/16/2016.  TECHNIQUE: MRI of the abdomen was obtained with and without IV contrast. Multisequence, multiplanar images were obtained.  FINDINGS:  LOWER CHEST: Unremarkable.  ABDOMEN:  HEPATOBILIARY: 1.4 x 1.7 cmhomogeneously hypoenhancing lesion seen in segment 4B of the liver (12:53). It demonstrates mild signal drop in the out-of-phase and mild hyperintensity on the in-phase T1-weighted precontrast sequence. No washout lesions are present. No biliary ductal dilatation. Gallbladder surgically absent. Small hiatal hernia. PANCREAS: Unremarkable. SPLEEN: Unremarkable. ADRENAL GLANDS: Stable 1.3 cm right and 1.6 cm left T1 hyperintense  nodular adrenal lesions with fat suppression in the the out of phase sequence consistent with bilateral adrenal adenomas. KIDNEYS/URETERS: Unremarkable. BOWEL/PERITONEUM/RETROPERITONEUM: No bowel obstruction. No acute inflammatory process. No ascites. VASCULATURE: Abdominal aorta within normal limits for patient's age. Unremarkable inferior vena cava. LYMPH NODES: No adenopathy.  BONES/SOFT TISSUES: Unremarkable.   CT abdomen and pelvis done at [facility] 12/16/16:  Result Impression  Since 09/30/2011 --New indeterminate low-attenuation hepatic lesion as described above. Recommend MRI of the abdomen with and without contrast for characterization. --Additional chronic and incidental findings, as above.  Result Narrative  EXAM: CT abdomen and pelvis with contrast DATE: 12/16/2016 3:57 PM ACCESSION: [id number] UN DICTATED: 12/16/2016 4:14 PM INTERPRETATION LOCATION: [place]  CLINICAL INDICATION: 66 years old Female with ABDOMINAL PAIN, (specify site in comments)-RUQ radiating to backR11.0-Nausea --presents with 3wk h/o of 10lb weight loss, nausea, fatigue following an acute viral illness while on a trip to [place].   COMPARISON: 09/30/2011 CT abdomen from [facility] and earlier  TECHNIQUE: A spiral CT scan was obtained with IV contrast from the lung bases to the pubic symphysis.Images were reconstructed in the axial plane. Coronal and sagittal reformatted images were also provided for further evaluation.  FINDINGS:  LINES/DEVICES: None.  LOWER CHEST: Bibasilar atelectasis. Coronary artery calcifications.  ABDOMEN/PELVIS:  HEPATOBILIARY: 1.7 cm low-attenuation lesion in segment IVb, new compared with 2013. No biliary ductal dilatation. Gallbladder is surgically absent. PANCREAS: Atrophic. SPLEEN: Unremarkable. ADRENAL GLANDS: Bilateral adrenal adenomas are grossly unchanged since 2013. KIDNEYS/URETERS: Vascular renal calcifications. No  hydronephrosis. BLADDER:  Unremarkable. BOWEL/PERITONEUM/RETROPERITONEUM: No bowel obstruction. No acute inflammatory process. No ascites. Normal appendix. VASCULATURE: Abdominal aorta within normal limits for patient's age. Extensive atherosclerotic calcifications of the abdominal aorta and its branch vessels. Unremarkable inferior vena cava. LYMPH NODES: No adenopathy. REPRODUCTIVE ORGANS: Status post hysterectomy. No adnexal masses.  BONES/SOFT TISSUES: Multilevel degenerative disease most prominent at L5-S1 with grade 1 retrolisthesis of L5 on S1. Sequelae of umbilical hernia repair.    Assessment & Plan:   Problem List Items Addressed This Visit      Cardiovascular and Mediastinum   Essential hypertension, benign    Under good control. Continue current regimen. Continue to monitor. Call with any concerns.       Relevant Orders   Comprehensive metabolic panel     Respiratory   COPD (chronic obstructive pulmonary disease) (HCC)    Stable. Continue current regimen. Continue to monitor. Follow up with pulmonology as needed.         Digestive   Gastroparesis    GI does not seem to think this is a cause of her symptoms. I am not so sure. Information about gastroparesis diet given today. Will get her into nutrition for help in controlling this. Encouraged liquid diet when possible. Will also get her into endocrinology.      Relevant Orders   Ambulatory referral to Endocrinology   Amb ref to Medical Nutrition Therapy-MNT   Adenoma of liver    Reassured patient that this looks benign. Labs have been stable. Continue to monitor.       Relevant Orders   Ambulatory referral to Endocrinology     Endocrine   Diabetes mellitus without complication ([place])    Under good control with A1c of 5.9 due to weight loss- will decrease metformin to 1000mg  daily to see if that helps her abdominal pain. Recheck 3 months.       Relevant Medications   metFORMIN (GLUCOPHAGE) 500 MG tablet   Other Relevant Orders    Bayer DCA Hb A1c Waived (Completed)   Comprehensive metabolic panel   Ambulatory referral to Endocrinology   Amb ref to Medical Nutrition Therapy-MNT   Thyroid activity decreased    Will recheck levels again today. Seems to be fluctuating a lot. Will get her into endocrinology for evaluation.       Relevant Orders   Thyroid Panel With TSH   Ambulatory referral to Endocrinology   Adrenal adenoma    Bilateral- GI wasn't sure if they were contributing to her fatigue and weight loss. Will get her into endocrinology for evaluation. Await their input.         Nervous and Auditory   Myoclonus    Much better on the keppra. Continue to follow with neurology. Call with any concerns.         Other   Hyperlipidemia    Rechecking levels today. Await results. Call with any concerns.       Relevant Orders   Comprehensive metabolic panel   Lipid Panel w/o Chol/HDL Ratio   Iron deficiency anemia - Primary    Rechecking levels again today- due to see hematology on Friday. Not feeling well again. Await results.       Relevant Orders   CBC with Differential/Platelet   Comprehensive metabolic panel   Ferritin   Malnutrition ([place])    Starting with temporal wasting. Cannot tolerate ensure. Will get her into nutrition- referral made today. Will start multivitamin. Encouraged liquid diet. Information about gastroparesis diet given to  patient today. Continue to monitor closely.      Relevant Orders   Ambulatory referral to Endocrinology   Amb ref to Medical Nutrition Therapy-MNT       Follow up plan: Return in about 4 weeks (around 03/16/2017) for Follow up weight .

## 2017-02-16 NOTE — Assessment & Plan Note (Signed)
Much better on the keppra. Continue to follow with neurology. Call with any concerns.

## 2017-02-16 NOTE — Assessment & Plan Note (Signed)
Rechecking levels today. Await results. Call with any concerns.  

## 2017-02-16 NOTE — Patient Instructions (Addendum)
Gastroparesis Gastroparesis, also called delayed gastric emptying, is a condition in which food takes longer than normal to empty from the stomach. The condition is usually long-lasting (chronic). What are the causes? This condition may be caused by:  An endocrine disorder, such as hypothyroidism or diabetes. Diabetes is the most common cause of this condition.  A nervous system disease, such as Parkinson disease or multiple sclerosis.  Cancer, infection, or surgery of the stomach or vagus nerve.  A connective tissue disorder, such as scleroderma.  Certain medicines.  In most cases, the cause is not known. What increases the risk? This condition is more likely to develop in:  People with certain disorders, including endocrine disorders, eating disorders, amyloidosis, and scleroderma.  People with certain diseases, including Parkinson disease or multiple sclerosis.  People with cancer or infection of the stomach or vagus nerve.  People who have had surgery on the stomach or vagus nerve.  People who take certain medicines.  Women.  What are the signs or symptoms? Symptoms of this condition include:  An early feeling of fullness when eating.  Nausea.  Weight loss.  Vomiting.  Heartburn.  Abdominal bloating.  Inconsistent blood glucose levels.  Lack of appetite.  Acid from the stomach coming up into the esophagus (gastroesophageal reflux).  Spasms of the stomach.  Symptoms may come and go. How is this diagnosed? This condition is diagnosed with tests, such as:  Tests that check how long it takes food to move through the stomach and intestines. These tests include: ? Upper gastrointestinal (GI) series. In this test, X-rays of the intestines are taken after you drink a liquid. The liquid makes the intestines show up better on the X-rays. ? Gastric emptying scintigraphy. In this test, scans are taken after you eat food that contains a small amount of radioactive  material. ? Wireless capsule GI monitoring system. This test involves swallowing a capsule that records information about movement through the stomach.  Gastric manometry. This test measures electrical and muscular activity in the stomach. It is done with a thin tube that is passed down the throat and into the stomach.  Endoscopy. This test checks for abnormalities in the lining of the stomach. It is done with a long, thin tube that is passed down the throat and into the stomach.  An ultrasound. This test can help rule out gallbladder disease or pancreatitis as a cause of your symptoms. It uses sound waves to take pictures of the inside of your body.  How is this treated? There is no cure for gastroparesis. This condition may be managed with:  Treatment of the underlying condition causing the gastroparesis.  Lifestyle changes, including exercise and dietary changes. Dietary changes can include: ? Changes in what and when you eat. ? Eating smaller meals more often. ? Eating low-fat foods. ? Eating low-fiber forms of high-fiber foods, such as cooked vegetables instead of raw vegetables. ? Having liquid foods in place of solid foods. Liquid foods are easier to digest.  Medicines. These may be given to control nausea and vomiting and to stimulate stomach muscles.  Getting food through a feeding tube. This may be done in severe cases.  A gastric neurostimulator. This is a device that is inserted into the body with surgery. It helps improve stomach emptying and control nausea and vomiting.  Follow these instructions at home:  Follow your health care provider's instructions about exercise and diet.  Take medicines only as directed by your health care provider. Contact a  health care provider if:  Your symptoms do not improve with treatment.  You have new symptoms. Get help right away if:  You have severe abdominal pain that does not improve with treatment.  You have nausea that does  not go away.  You cannot keep fluids down. This information is not intended to replace advice given to you by your health care provider. Make sure you discuss any questions you have with your health care provider. Document Released: 08/24/2005 Document Revised: 01/30/2016 Document Reviewed: 08/20/2014 Elsevier Interactive Patient Education  [employer].  Here are some tips to help you decide what foods to eat and what foods to avoid. General dietary recommendations for gastroparesis include: Eat smaller, more frequent meals  Eat less fatty foods  Avoid fiber  Avoid foods that cannot be chewed well  Foods that are generally encouraged include:  Breads, cereals, crackers, ground or pureed meats  Vegetables - cooked and, if necessary, blenderized/strained  Fruits - cooked and, if necessary, blenderized/strained  Juices, beverages, milk products, if tolerated  Small, frequent meals Reducing the meal size reduces the distention of the stomach from the meal. By eating smaller meals, you may not feel as full or bloated and the stomach may empty faster. With the reduction in meal size, increasing the number of meals to 4?6 per day is needed to maintain adequate nutritional intake. Avoid foods high in fat Fat can delay emptying of the stomach. Eating less fat-containing foods will decrease the amount of time food stays in the stomach. However, fat containing liquids, such as milkshakes, may be tolerated and provide needed calories. A diet low in fiber is suggested Fiber delays gastric emptying. In addition, fiber may bind together and cause a blockage of the stomach (called a bezoar). Examples of high fiber foods that should be avoided include: Fruits - apples, berries, coconuts, figs, oranges, persimmons  Vegetables - Brussels sprouts, green beans, green peas, lettuce, potato peels, sauerkraut  Bran/whole grain cereals  Nuts and seeds  Legumes/Dried Beans - baked beans, lentils, soy  beans Fiber supplements for treatment of constipation should also be discontinued if possible. Avoid foods that may not be easily chewed Examples of hard to chew foods include: Broccoli  Corn  Popcorn,  Nuts  Seeds Chew food well before swallowing Solid food in the stomach does not empty well. Dental problems, such as missing or broken teeth, may lead to poorly chewed food. This may add to the problem of inadequate breakdown of food into smaller particles in the stomach for passage into the small intestine for absorption. Position Taking fluids throughout the meal and sitting upright or walking for 1?2 hours after meals may help in the emptying of the meal from the stomach. Vitamins and minerals A daily multivitamin/mineral supplement can be taken if dietary intake is inadequate. Liquids When basic guidelines aren't enough to control your gastroparesis, you may be advised to consume the bulk of your meals as semi-solids or liquids, such as pured foods or soups. Stomach emptying of liquids is often normal in patients with gastroparesis. Calorie-containing drinks, such as Hawaiian Punch or Hi C, provide fluid and calories, hence are better than water alone. Some options while on a liquid diet include: Milk  Instant breakfast  Milkshakes  Yogurt  Puddings  Custard  Cereals (soft/easy to chew)  Smoothies Supplements To meet your nutritional needs, it may be necessary to supplement your diet with a commercially available liquid nutrient preparation that is low in fiber such as  Ensure, Boost, or even baby foods. Blenderized foods may also be used as a liquid nutrient source. Any food can be blenderized. Solid foods will need to be thinned with some type of liquid, such as broth, milk, juice, water. Remember to clean the blender well after each use. Medications to avoid There are quite a few medications that can delay stomach emptying. Tell your doctor about all the medications you are taking  and ask if any could be slowing down your stomach emptying. Here are some examples that can slow your stomach emptying: Aluminum-containing antacids (Amphojel)  Narcotic pain medications (Percocet, Tylenol #3, Oxycontin, and others)  Anticholinergic agents (Bentyl, Levsin, Elavil, and others)  Bulk-forming agents (Metamucil, Perdiem, Fibercon, and others) Diabetes If your gastroparesis is due to diabetes, an important goal is to achieve or maintain good glucose control. This is achieved more easily by frequent monitoring of blood sugar levels and adjustment of insulin. Keeping your blood sugar under control may help stomach emptying. Let your doctor know if your blood sugar runs more than 200 on a regular basis. With gastroparesis, eating smaller meals may help you feel less full. With smaller meals, you'll need to eat 4?6 per day to maintain nutritional intake. Here are sample meal plans for 6 small meals throughout 1 day. Your doctor and a Registered Dietitian can help create meal plans that work best for you. Breakfast 1 cup cream of wheat cereal   cup skim milk   cup grape juice  1 scrambled egg Snack 10 ounces of instant breakfast with skim milk Lunch  cup vegetable soup   [place] sandwich   cup applesauce   cup milk  1 tablespoon mayonnaise Snack 10 ounces banana shake made with l plain or vanilla yogurt, milk and sugar Dinner 2-3 ounces baked chicken or fish   cup mashed potatoes  1 teaspoon margarine   cup spinach   cup milk   cup fruit cocktail Snack  cup pudding, custard or gelatin

## 2017-02-16 NOTE — Assessment & Plan Note (Signed)
Rechecking levels again today- due to see hematology on Friday. Not feeling well again. Await results.

## 2017-02-16 NOTE — Assessment & Plan Note (Signed)
Will recheck levels again today. Seems to be fluctuating a lot. Will get her into endocrinology for evaluation.

## 2017-02-16 NOTE — Assessment & Plan Note (Signed)
Reassured patient that this looks benign. Labs have been stable. Continue to monitor.

## 2017-02-17 LAB — THYROID PANEL WITH TSH
FREE THYROXINE INDEX: 2.4 (ref 1.2–4.9)
T3 UPTAKE RATIO: 29 % (ref 24–39)
T4, Total: 8.2 ug/dL (ref 4.5–12.0)
TSH: 0.237 u[IU]/mL — ABNORMAL LOW (ref 0.450–4.500)

## 2017-02-17 LAB — CBC WITH DIFFERENTIAL/PLATELET
BASOS: 1 %
Basophils Absolute: 0.1 10*3/uL (ref 0.0–0.2)
EOS (ABSOLUTE): 0.3 10*3/uL (ref 0.0–0.4)
EOS: 6 %
HEMOGLOBIN: 13.5 g/dL (ref 11.1–15.9)
Hematocrit: 40.4 % (ref 34.0–46.6)
IMMATURE GRANS (ABS): 0 10*3/uL (ref 0.0–0.1)
Immature Granulocytes: 0 %
LYMPHS: 35 %
Lymphocytes Absolute: 2 10*3/uL (ref 0.7–3.1)
MCH: 30.8 pg (ref 26.6–33.0)
MCHC: 33.4 g/dL (ref 31.5–35.7)
MCV: 92 fL (ref 79–97)
Monocytes Absolute: 0.4 10*3/uL (ref 0.1–0.9)
Monocytes: 6 %
NEUTROS ABS: 2.9 10*3/uL (ref 1.4–7.0)
NEUTROS PCT: 52 %
PLATELETS: 291 10*3/uL (ref 150–379)
RBC: 4.38 x10E6/uL (ref 3.77–5.28)
RDW: 14.4 % (ref 12.3–15.4)
WBC: 5.7 10*3/uL (ref 3.4–10.8)

## 2017-02-17 LAB — COMPREHENSIVE METABOLIC PANEL
A/G RATIO: 1.3 (ref 1.2–2.2)
ALBUMIN: 4.2 g/dL (ref 3.6–4.8)
ALT: 17 IU/L (ref 0–32)
AST: 15 IU/L (ref 0–40)
Alkaline Phosphatase: 92 IU/L (ref 39–117)
BUN / CREAT RATIO: 17 (ref 12–28)
BUN: 17 mg/dL (ref 8–27)
Bilirubin Total: <0.2 mg/dL (ref 0.0–1.2)
CALCIUM: 10 mg/dL (ref 8.7–10.3)
CHLORIDE: 104 mmol/L (ref 96–106)
CO2: 24 mmol/L (ref 20–29)
Creatinine, Ser: 1.03 mg/dL — ABNORMAL HIGH (ref 0.57–1.00)
GFR calc Af Amer: 65 mL/min/{1.73_m2} (ref >59)
GFR, EST NON AFRICAN AMERICAN: 56 mL/min/{1.73_m2} — AB (ref >59)
Globulin, Total: 3.3 g/dL (ref 1.5–4.5)
Glucose: 127 mg/dL — ABNORMAL HIGH (ref 65–99)
POTASSIUM: 4.9 mmol/L (ref 3.5–5.2)
Sodium: 140 mmol/L (ref 134–144)
Total Protein: 7.5 g/dL (ref 6.0–8.5)

## 2017-02-17 LAB — LIPID PANEL W/O CHOL/HDL RATIO
CHOLESTEROL TOTAL: 220 mg/dL — AB (ref 100–199)
HDL: 47 mg/dL (ref >39)
LDL Calculated: 148 mg/dL — ABNORMAL HIGH (ref 0–99)
TRIGLYCERIDES: 124 mg/dL (ref 0–149)
VLDL Cholesterol Cal: 25 mg/dL (ref 5–40)

## 2017-02-19 ENCOUNTER — Telehealth: Payer: Self-pay | Admitting: Family Medicine

## 2017-02-19 MED ORDER — LEVOTHYROXINE SODIUM 88 MCG PO TABS: 88.0000 ug | ORAL_TABLET | Freq: Every day | ORAL | 1 refills | Status: DC

## 2017-02-19 NOTE — Telephone Encounter (Signed)
Called patient with results. Thyroid over treated- will decrease dose to 50mcg and recheck in 6 weeks. Call with any concerns.

## 2017-02-23 ENCOUNTER — Other Ambulatory Visit: Payer: Self-pay | Admitting: *Deleted

## 2017-02-23 ENCOUNTER — Other Ambulatory Visit: Payer: Self-pay | Admitting: Hematology and Oncology

## 2017-02-25 ENCOUNTER — Other Ambulatory Visit: Payer: Self-pay | Admitting: *Deleted

## 2017-02-25 ENCOUNTER — Inpatient Hospital Stay: Payer: PPO | Attending: Hematology and Oncology

## 2017-02-25 DIAGNOSIS — K219 Gastro-esophageal reflux disease without esophagitis: Secondary | ICD-10-CM | POA: Diagnosis not present

## 2017-02-25 DIAGNOSIS — M25552 Pain in left hip: Secondary | ICD-10-CM | POA: Insufficient documentation

## 2017-02-25 DIAGNOSIS — R5381 Other malaise: Secondary | ICD-10-CM | POA: Diagnosis not present

## 2017-02-25 DIAGNOSIS — Z87891 Personal history of nicotine dependence: Secondary | ICD-10-CM | POA: Insufficient documentation

## 2017-02-25 DIAGNOSIS — R109 Unspecified abdominal pain: Secondary | ICD-10-CM | POA: Insufficient documentation

## 2017-02-25 DIAGNOSIS — Z79899 Other long term (current) drug therapy: Secondary | ICD-10-CM | POA: Insufficient documentation

## 2017-02-25 DIAGNOSIS — D508 Other iron deficiency anemias: Secondary | ICD-10-CM

## 2017-02-25 DIAGNOSIS — Z801 Family history of malignant neoplasm of trachea, bronchus and lung: Secondary | ICD-10-CM | POA: Insufficient documentation

## 2017-02-25 DIAGNOSIS — I10 Essential (primary) hypertension: Secondary | ICD-10-CM | POA: Diagnosis not present

## 2017-02-25 DIAGNOSIS — E039 Hypothyroidism, unspecified: Secondary | ICD-10-CM | POA: Diagnosis not present

## 2017-02-25 DIAGNOSIS — J449 Chronic obstructive pulmonary disease, unspecified: Secondary | ICD-10-CM | POA: Diagnosis not present

## 2017-02-25 DIAGNOSIS — K648 Other hemorrhoids: Secondary | ICD-10-CM | POA: Insufficient documentation

## 2017-02-25 DIAGNOSIS — F5089 Other specified eating disorder: Secondary | ICD-10-CM | POA: Insufficient documentation

## 2017-02-25 DIAGNOSIS — D3501 Benign neoplasm of right adrenal gland: Secondary | ICD-10-CM | POA: Diagnosis not present

## 2017-02-25 DIAGNOSIS — Z803 Family history of malignant neoplasm of breast: Secondary | ICD-10-CM | POA: Insufficient documentation

## 2017-02-25 DIAGNOSIS — D509 Iron deficiency anemia, unspecified: Secondary | ICD-10-CM | POA: Insufficient documentation

## 2017-02-25 DIAGNOSIS — R5383 Other fatigue: Secondary | ICD-10-CM | POA: Insufficient documentation

## 2017-02-25 DIAGNOSIS — Z7982 Long term (current) use of aspirin: Secondary | ICD-10-CM | POA: Insufficient documentation

## 2017-02-25 DIAGNOSIS — R634 Abnormal weight loss: Secondary | ICD-10-CM | POA: Insufficient documentation

## 2017-02-25 DIAGNOSIS — D3502 Benign neoplasm of left adrenal gland: Secondary | ICD-10-CM | POA: Diagnosis not present

## 2017-02-25 DIAGNOSIS — E119 Type 2 diabetes mellitus without complications: Secondary | ICD-10-CM | POA: Insufficient documentation

## 2017-02-25 DIAGNOSIS — Z7984 Long term (current) use of oral hypoglycemic drugs: Secondary | ICD-10-CM | POA: Insufficient documentation

## 2017-02-25 LAB — CBC WITH DIFFERENTIAL/PLATELET
Basophils Absolute: 0.1 10*3/uL (ref 0–0.1)
Basophils Relative: 1 %
Eosinophils Absolute: 0.3 10*3/uL (ref 0–0.7)
Eosinophils Relative: 4 %
HCT: 36.5 % (ref 35.0–47.0)
Hemoglobin: 12.8 g/dL (ref 12.0–16.0)
Lymphocytes Relative: 35 %
Lymphs Abs: 2.2 10*3/uL (ref 1.0–3.6)
MCH: 31.7 pg (ref 26.0–34.0)
MCHC: 35.1 g/dL (ref 32.0–36.0)
MCV: 90.5 fL (ref 80.0–100.0)
Monocytes Absolute: 0.5 10*3/uL (ref 0.2–0.9)
Monocytes Relative: 8 %
Neutro Abs: 3.3 10*3/uL (ref 1.4–6.5)
Neutrophils Relative %: 52 %
Platelets: 248 10*3/uL (ref 150–440)
RBC: 4.04 MIL/uL (ref 3.80–5.20)
RDW: 13.6 % (ref 11.5–14.5)
WBC: 6.4 10*3/uL (ref 3.6–11.0)

## 2017-02-25 LAB — FERRITIN: Ferritin: 50 ng/mL (ref 11–307)

## 2017-02-26 ENCOUNTER — Inpatient Hospital Stay: Payer: PPO

## 2017-02-26 ENCOUNTER — Inpatient Hospital Stay (HOSPITAL_BASED_OUTPATIENT_CLINIC_OR_DEPARTMENT_OTHER): Payer: PPO | Admitting: Hematology and Oncology

## 2017-02-26 ENCOUNTER — Other Ambulatory Visit: Payer: PPO

## 2017-02-26 ENCOUNTER — Encounter: Payer: Self-pay | Admitting: Hematology and Oncology

## 2017-02-26 VITALS — BP 110/74 | HR 91 | Temp 98.1°F | Resp 18 | Wt 187.4 lb

## 2017-02-26 DIAGNOSIS — M25552 Pain in left hip: Secondary | ICD-10-CM | POA: Diagnosis not present

## 2017-02-26 DIAGNOSIS — D509 Iron deficiency anemia, unspecified: Secondary | ICD-10-CM

## 2017-02-26 DIAGNOSIS — Z7984 Long term (current) use of oral hypoglycemic drugs: Secondary | ICD-10-CM

## 2017-02-26 DIAGNOSIS — I10 Essential (primary) hypertension: Secondary | ICD-10-CM

## 2017-02-26 DIAGNOSIS — K648 Other hemorrhoids: Secondary | ICD-10-CM

## 2017-02-26 DIAGNOSIS — J449 Chronic obstructive pulmonary disease, unspecified: Secondary | ICD-10-CM | POA: Diagnosis not present

## 2017-02-26 DIAGNOSIS — K219 Gastro-esophageal reflux disease without esophagitis: Secondary | ICD-10-CM

## 2017-02-26 DIAGNOSIS — M898X9 Other specified disorders of bone, unspecified site: Secondary | ICD-10-CM

## 2017-02-26 DIAGNOSIS — E119 Type 2 diabetes mellitus without complications: Secondary | ICD-10-CM | POA: Diagnosis not present

## 2017-02-26 DIAGNOSIS — R634 Abnormal weight loss: Secondary | ICD-10-CM

## 2017-02-26 DIAGNOSIS — R5381 Other malaise: Secondary | ICD-10-CM

## 2017-02-26 DIAGNOSIS — D3501 Benign neoplasm of right adrenal gland: Secondary | ICD-10-CM

## 2017-02-26 DIAGNOSIS — D3502 Benign neoplasm of left adrenal gland: Secondary | ICD-10-CM

## 2017-02-26 DIAGNOSIS — Z79899 Other long term (current) drug therapy: Secondary | ICD-10-CM

## 2017-02-26 DIAGNOSIS — R109 Unspecified abdominal pain: Secondary | ICD-10-CM

## 2017-02-26 DIAGNOSIS — F5089 Other specified eating disorder: Secondary | ICD-10-CM

## 2017-02-26 DIAGNOSIS — E039 Hypothyroidism, unspecified: Secondary | ICD-10-CM

## 2017-02-26 DIAGNOSIS — Z87891 Personal history of nicotine dependence: Secondary | ICD-10-CM

## 2017-02-26 DIAGNOSIS — Z7982 Long term (current) use of aspirin: Secondary | ICD-10-CM

## 2017-02-26 DIAGNOSIS — R5383 Other fatigue: Secondary | ICD-10-CM

## 2017-02-26 DIAGNOSIS — Z801 Family history of malignant neoplasm of trachea, bronchus and lung: Secondary | ICD-10-CM

## 2017-02-26 DIAGNOSIS — Z803 Family history of malignant neoplasm of breast: Secondary | ICD-10-CM

## 2017-02-26 NOTE — Progress Notes (Signed)
Patient states she has chronic hip pain 5/10.  She also has intermittent "lightening strike" headaches due to occipital neuralgia.  Patient states she has felt bad for awhile.  She had a CT in May that revealed a benign hepatic tumor.  On May 17th she had a colonoscopy that revealed precancerous polyps.  She also had an MRI.   All tests done through [facility].

## 2017-02-26 NOTE — Progress Notes (Signed)
[facility] day:  02/26/2017   Chief Complaint: Leslie Olsen is an 66 y.o. female with iron deficiency anemia who seen for 3 month assessment.  HPI: The patient was last seen in the medical oncology clinic on 11/26/2016.  At that time, she had acute on chronic shortness of breath.  Exam was stable.   Hematocrit was 35.0.  Ferritin was 10.  She received Venofer on 11/27/2016.   CBC on 02/25/2017 revealed a hematocrit of 36.5, hemoglobin 12.8, and MCV 90.5.  Ferritin was 50.  Symptomatically, patient reports that she "feels bad most days". Patient reporting severe fatigue, LEFT hip pain (describes as a "boring" and "pushing" pain), RIGHT flank pain, and weight loss. Of note, patient reports that she has been seen by GI at [facility], and subsequently had a CT scan, MRI, and colonoscopy; done in May 2018. She reports that the CT and MRI demonstrated benign findings; bilateral adrenal adenomas. The colonoscopy discovered "several pre-cancerous polyps". GI at [facility] has patient following up in 1 year.   Past Medical History:  Diagnosis Date  . Anemia   . Chronic headaches   . COPD (chronic obstructive pulmonary disease) ([place])   . Diabetes mellitus without complication ([place])   . GERD (gastroesophageal reflux disease)   . Hypertension   . Hypothyroidism     Past Surgical History:  Procedure Laterality Date  . ABDOMINAL HYSTERECTOMY    . BACK SURGERY    . CHOLECYSTECTOMY    . CORONARY STENT PLACEMENT    . ESOPHAGOGASTRODUODENOSCOPY (EGD) WITH PROPOFOL N/A 05/26/2016   Procedure: ESOPHAGOGASTRODUODENOSCOPY (EGD) WITH PROPOFOL;  Surgeon: Lucilla Lame, MD;  Location: [facility] ENDOSCOPY;  Service: Endoscopy;  Laterality: N/A;  . GIVENS CAPSULE STUDY N/A 05/05/2016   Procedure: GIVENS CAPSULE STUDY;  Surgeon: Lucilla Lame, MD;  Location: [facility] ENDOSCOPY;  Service: Endoscopy;  Laterality: N/A;  . REDUCTION MAMMAPLASTY    . REPLACEMENT TOTAL KNEE    . SHOULDER  SURGERY    . TIBIA FRACTURE SURGERY      Family History  Problem Relation Age of Onset  . Lung cancer Maternal Aunt   . Breast cancer Maternal Aunt 57  . Other Mother        Killed in car accident, 54  . Other Father        Killed in car accident, 60  . Thyroid disease Brother   . Thyroid disease Sister     Social History:  reports that she quit smoking about 5 years ago. Her smoking use included Cigarettes. She has a 90.00 pack-year smoking history. She has never used smokeless tobacco. She reports that she does not drink alcohol or use drugs.  She lives in [place].  She is alone today.  Allergies: No Known Allergies  Current Medications: Current Outpatient Prescriptions  Medication Sig Dispense Refill  . albuterol (PROVENTIL HFA;VENTOLIN HFA) 108 (90 Base) MCG/ACT inhaler Inhale 2 puffs into the lungs every 6 (six) hours as needed for wheezing. 6.7 g 5  . aspirin 81 MG tablet Take 81 mg by mouth daily.    . budesonide-formoterol (SYMBICORT) 160-4.5 MCG/ACT inhaler Inhale 1 puff into the lungs 2 (two) times daily. 1 Inhaler 5  . clonazePAM (KLONOPIN) 0.5 MG tablet Take 0.5 mg by mouth at bedtime.     . ferrous fumarate (HEMOCYTE - 106 MG FE) 325 (106 FE) MG TABS tablet Take 1 tablet by mouth daily.    Marland Kitchen gabapentin (NEURONTIN) 300 MG capsule Take  1 capsule (300 mg total) by mouth 3 (three) times daily. 270 capsule 3  . glucose blood (COOL BLOOD GLUCOSE TEST STRIPS) test strip Use as instructed 100 each 12  . levETIRAcetam (KEPPRA) 250 MG tablet Take 250 mg by mouth.    . levothyroxine (SYNTHROID, LEVOTHROID) 88 MCG tablet Take 1 tablet (88 mcg total) by mouth daily. Brand name required 30 tablet 1  . lisinopril (PRINIVIL,ZESTRIL) 5 MG tablet Take 1 tablet (5 mg total) by mouth daily. 90 tablet 1  . metFORMIN (GLUCOPHAGE) 500 MG tablet Take 2 tablets (1,000 mg total) by mouth daily with breakfast. 360 tablet 1  . SUMAtriptan (IMITREX) 100 MG tablet Take 100 mg by mouth every 2  (two) hours as needed for migraine. May repeat in 2 hours if headache persists or recurs.     No current facility-administered medications for this visit.     Review of Systems:  GENERAL:  Feels "horrible".  Struggling.  No fevers or sweats.  Weight down 11 pounds in 3 months. PERFORMANCE STATUS (ECOG):  2 HEENT:  No visual changes, runny nose, sore throat, mouth sores or tenderness. (+) scleral icterus.. Lungs:  COPD.  Acute on chronic shortness of breath.  Rare cough.  No pleuritic chest pain.  No hemoptysis.  Uses CPAP. Cardiac:  No chest pain, palpitations, or PND. Stress test and EKG negative. GI:  No nausea, vomiting, diarrhea, constipation, melena or hematochezia. GU:  No urgency, frequency, dysuria, or hematuria. Musculoskeletal:  No back pain.  (+) LEFT hip pain; no other joint pain.  No muscle tenderness. Extremities:  No pain or swelling. Skin:  No rashes or skin changes. Neuro:  Headaches, left side of head.  Myoclonic dystonia.  No numbness or weakness, balance or coordination issues. Endocrine:  Hypothyroid on Synthroid.  Diabetes.  No hot flashes or night sweats. Psych:  No mood changes, depression or anxiety. Pain:  No focal pain. Review of systems:  All other systems reviewed and found to be negative.   Physical Exam: Blood pressure 110/74, pulse 91, temperature 98.1 F (36.7 C), temperature source Tympanic, resp. rate 18, weight 187 lb 6 oz (85 kg).  GENERAL:  Well developed, well nourished, woman sitting comfortably in the exam room in no acute distress. MENTAL STATUS:  Alert and oriented to person, place and time. HEAD:  Short gray hair.  Normocephalic, atraumatic, face symmetric, no Cushingoid features. EYES:  Gold rimmed glasses.  Blue eyes.  Pupils equal round and reactive to light and accomodation.  No conjunctivitis. (+) slight scleral icterus. ENT:  Speaks in short sentences secondary to breathing issues (chronic).  Oropharynx clear without lesion.  Partial.   Tongue normal.  Mucous membranes moist.  RESPIRATORY:  Clear to auscultation without rales, wheezes or rhonchi.  CARDIOVASCULAR:  Regular rate and rhythm without murmur, rub or gallop. ABDOMEN:  Soft, non-tender, with active bowel sounds, and no appreciable hepatosplenomegaly.  No masses. SKIN:  No rashes, ulcers or lesions. EXTREMITIES: No edema, no skin discoloration or tenderness.  No palpable cords. LYMPH NODES: No palpable cervical, supraclavicular, axillary or inguinal adenopathy  NEUROLOGICAL: Unremarkable. PSYCH:  Appropriate.    Appointment on 02/25/2017  Component Date Value Ref Range Status  . WBC 02/25/2017 6.4  3.6 - 11.0 K/uL Final  . RBC 02/25/2017 4.04  3.80 - 5.20 MIL/uL Final  . Hemoglobin 02/25/2017 12.8  12.0 - 16.0 g/dL Final  . HCT 02/25/2017 36.5  35.0 - 47.0 % Final  . MCV 02/25/2017 90.5  80.0 -  100.0 fL Final  . MCH 02/25/2017 31.7  26.0 - 34.0 pg Final  . MCHC 02/25/2017 35.1  32.0 - 36.0 g/dL Final  . RDW 02/25/2017 13.6  11.5 - 14.5 % Final  . Platelets 02/25/2017 248  150 - 440 K/uL Final  . Neutrophils Relative % 02/25/2017 52  % Final  . Neutro Abs 02/25/2017 3.3  1.4 - 6.5 K/uL Final  . Lymphocytes Relative 02/25/2017 35  % Final  . Lymphs Abs 02/25/2017 2.2  1.0 - 3.6 K/uL Final  . Monocytes Relative 02/25/2017 8  % Final  . Monocytes Absolute 02/25/2017 0.5  0.2 - 0.9 K/uL Final  . Eosinophils Relative 02/25/2017 4  % Final  . Eosinophils Absolute 02/25/2017 0.3  0 - 0.7 K/uL Final  . Basophils Relative 02/25/2017 1  % Final  . Basophils Absolute 02/25/2017 0.1  0 - 0.1 K/uL Final  . Ferritin 02/25/2017 50  11 - 307 ng/mL Final    Assessment:  SISSY GOETZKE is an 66 y.o. female with recurrent iron deficiency anemia secondary to AVMs..    EGD on 06/05/2014 revealed a hiatal hernia and gastritis.  Colonoscopy on 06/05/2014 noted revealed a poor prep.  There was a 7 mm polyp in the transverse colon and non-bleeding internal hemorrhoids.  Capsule  study on 05/26/2016 revealed   Multiple angiodysplastic lesion with bleeding in the entire duodenum and  jejunum.  She had grade A esophagitis.  Her diet is good.  She denies any melena or hematochezia.  She denies any uterine bleeding.    She has a 90 pack year smoking history.  She has COPD.  Chest CT on 08/23/2013 revealed a questionable lesion at the pleura.  Chest CT on 08/23/2014 suggested benign etiology with similar scattered pulmonary nodules, unchanged ground glass opacity posterior right upper lobe, subtle subpleural reticulation, and similar low density enlargement of both adrenals.  CXR on 11/27/2015 revealed no active cardiopulmonary disease.  She has mild stable COPD per Dr. Stevenson Clinch on 01/07/2016.  Cardiology evaluation revealed a normal exercise nuclear stress test on 12/18/2015 and echocardiogram on 11/2015 (EF 60-65%).  She has sleep apnea and is using her CPAP again.  She was on oral iron x 2 years.  She developed recurrent iron deficiency anemia.  She denies any melena or hematochezia.  She received Venofer 600 mg (10/29/2015 - 11/12/2015) and 400 mg (01/29/2016 - 02/05/2016) and 200 mg (04/23/2016).  Popcorn and ice pica have resolved.    Ferritin has been followed: 15 on 01/14/2015, 7 on 10/22/2015, 73 on 11/19/2015, 15 on 01/22/2016, 47 on 03/04/2016, 18 on 04/16/2016, 17 on 07/14/2016, 25 on 09/03/2016, 10 on 10/28/2016, 47 on 11/27/2016, 71 on 12/29/2016, 83 on 02/16/2017, and 50 on 02/25/2017.  Patient receives Venofer if her ferritin is < 30.  She has had left hip and right flank pain.  CT scan, MRI, and colonoscopy performed in May 2018 revealed benign findings.  She has benign adrenal adenomas. CT and MRI demonstrated benign findings; bilateral adrenal adenomas  She is on Klonopin and Keppra.   Symptomatically, she has acute on chronic shortness of breath.  Exam is stable.  Hemoglobin is 12.8,  hematocrit is 36.5, and platelets 248,000.  Ferritin is 50.  Plan: 1.  Review  labs from yesterday.  Ferritin adequate.  No need for IV iron. 2.  Labs today to evaluate for a monoclonal gammopathy:  SPEP, immunoglobulins, kappa/[place] light chains, UPEP. 3.  RN to call patient if results abnormal and  schedule patient for follow up appointment sooner.  4.  RTC in 3 weeks for MD assessment and review of labs.   Lequita Asal, MD  02/26/2017 , 2:39 PM

## 2017-03-01 ENCOUNTER — Other Ambulatory Visit: Payer: Self-pay

## 2017-03-01 DIAGNOSIS — D509 Iron deficiency anemia, unspecified: Secondary | ICD-10-CM

## 2017-03-01 LAB — MULTIPLE MYELOMA PANEL, SERUM
Albumin SerPl Elph-Mcnc: 4 g/dL (ref 2.9–4.4)
Albumin/Glob SerPl: 1.2 (ref 0.7–1.7)
Alpha 1: 0.2 g/dL (ref 0.0–0.4)
Alpha2 Glob SerPl Elph-Mcnc: 0.8 g/dL (ref 0.4–1.0)
B-Globulin SerPl Elph-Mcnc: 1.1 g/dL (ref 0.7–1.3)
Gamma Glob SerPl Elph-Mcnc: 1.3 g/dL (ref 0.4–1.8)
Globulin, Total: 3.4 g/dL (ref 2.2–3.9)
IgA: 233 mg/dL (ref 87–352)
IgG (Immunoglobin G), Serum: 1258 mg/dL (ref 700–1600)
IgM, Serum: 138 mg/dL (ref 26–217)
Total Protein ELP: 7.4 g/dL (ref 6.0–8.5)

## 2017-03-01 LAB — KAPPA/LAMBDA LIGHT CHAINS
Kappa free light chain: 25.6 mg/L — ABNORMAL HIGH (ref 3.3–19.4)
Kappa, lambda light chain ratio: 1.62 (ref 0.26–1.65)
Lambda free light chains: 15.8 mg/L (ref 5.7–26.3)

## 2017-03-02 LAB — IFE+PROTEIN ELECTRO, 24-HR UR
% BETA, Urine: 0 %
ALPHA 1 URINE: 0 %
Albumin, U: 100 %
Alpha 2, Urine: 0 %
GAMMA GLOBULIN URINE: 0 %
Total Protein, Urine-Ur/day: 122 mg/24 hr (ref 30–150)
Total Protein, Urine: 4.2 mg/dL
Total Volume: 2900

## 2017-03-16 LAB — SPECIMEN STATUS REPORT

## 2017-03-16 LAB — FERRITIN: FERRITIN: 83 ng/mL (ref 15–150)

## 2017-03-18 ENCOUNTER — Encounter: Payer: Self-pay | Admitting: Family Medicine

## 2017-03-18 ENCOUNTER — Ambulatory Visit (INDEPENDENT_AMBULATORY_CARE_PROVIDER_SITE_OTHER): Payer: PPO | Admitting: Family Medicine

## 2017-03-18 VITALS — BP 131/82 | HR 67 | Temp 98.2°F | Wt 187.0 lb

## 2017-03-18 DIAGNOSIS — E441 Mild protein-calorie malnutrition: Secondary | ICD-10-CM | POA: Diagnosis not present

## 2017-03-18 DIAGNOSIS — K3184 Gastroparesis: Secondary | ICD-10-CM | POA: Diagnosis not present

## 2017-03-18 DIAGNOSIS — D509 Iron deficiency anemia, unspecified: Secondary | ICD-10-CM | POA: Diagnosis not present

## 2017-03-18 NOTE — Assessment & Plan Note (Signed)
Weight has stabilized and she states that her appetite is better. Continue to monitor.

## 2017-03-18 NOTE — Progress Notes (Signed)
BP 131/82 (BP Location: Left Arm, Patient Position: Sitting, Cuff Size: Large)   Pulse 67   Temp 98.2 F (36.8 C)   Wt 187 lb (84.8 kg)   SpO2 97%   BMI 30.18 kg/m    Subjective:    Patient ID: [name], female    DOB: [birth year], 66 y.o.   MRN: [id number]  HPI: DARTHULA DESA is a 66 y.o. female  Chief Complaint  Patient presents with  . Weight Check   Did not see nutrition. Notes that she "knows what she should eat, just doesn't have an appetite." When asked, she notes that she should eat a bland diet. Does not note liquid diet or multiple small meals.  Doesn't want to see [facility] endocrine. They called, but couldn't see her until September. She would like to see [facility] anyway, so she didn't schedule an appointment.  Seeing neurology at [facility] and they are managing her headaches- would like to keep all her specialists with them.  She is very frustrated and still feeling terrible. She states that she is about ready to "give up on doctors". She does note that her diaphragm seems much better and that she is feeling much better on the keppra. She is happy with her neurologist and feeling well. Appetite is better. She notes that it is stable. Nausea is now coming in waves. Sometimes thoughts of smells of food make her feel bad. She states that she would be happy to lose another 30 lbs, but is happy that her weight has stabilized. She is otherwise hanging in there with no other concerns or complaints at this time.   Relevant past medical, surgical, family and social history reviewed and updated as indicated. Interim medical history since our last visit reviewed. Allergies and medications reviewed and updated.  Review of Systems  Constitutional: Positive for activity change, appetite change, fatigue and unexpected weight change. Negative for chills, diaphoresis and fever.  Respiratory: Negative.   Cardiovascular: Negative.   Gastrointestinal: Positive for nausea and vomiting. Negative for  abdominal distention, abdominal pain, anal bleeding, blood in stool, constipation, diarrhea and rectal pain.  Musculoskeletal: Positive for myalgias. Negative for arthralgias, back pain, gait problem, joint swelling, neck pain and neck stiffness.  Psychiatric/Behavioral: Positive for dysphoric mood. Negative for agitation, behavioral problems, confusion, decreased concentration, hallucinations, self-injury, sleep disturbance and suicidal ideas. The patient is not nervous/anxious and is not hyperactive.     Per HPI unless specifically indicated above     Objective:    BP 131/82 (BP Location: Left Arm, Patient Position: Sitting, Cuff Size: Large)   Pulse 67   Temp 98.2 F (36.8 C)   Wt 187 lb (84.8 kg)   SpO2 97%   BMI 30.18 kg/m   Wt Readings from Last 3 Encounters:  03/18/17 187 lb (84.8 kg)  02/26/17 187 lb 6 oz (85 kg)  02/16/17 188 lb 3.2 oz (85.4 kg)    Physical Exam  Constitutional: She is oriented to person, place, and time. She appears well-developed and well-nourished. No distress.  HENT:  Head: Normocephalic and atraumatic.  Right Ear: Hearing normal.  Left Ear: Hearing normal.  Nose: Nose normal.  Eyes: Conjunctivae and lids are normal. Right eye exhibits no discharge. Left eye exhibits no discharge. No scleral icterus.  Cardiovascular: Normal rate, regular rhythm, normal heart sounds and intact distal pulses.  Exam reveals no gallop and no friction rub.   No murmur heard. Pulmonary/Chest: Effort normal and breath sounds normal. No respiratory  distress. She has no wheezes. She has no rales. She exhibits no tenderness.  Musculoskeletal: Normal range of motion.  Neurological: She is alert and oriented to person, place, and time.  Skin: Skin is warm, dry and intact. No rash noted. No erythema. No pallor.  Psychiatric: Her speech is normal and behavior is normal. Judgment and thought content normal. Her affect is blunt. Cognition and memory are normal. She exhibits a  depressed mood.  Nursing note and vitals reviewed.   Results for orders placed or performed in visit on 03/01/17  IFE+PROTEIN ELECTRO, 24-HR UR  Result Value Ref Range   Total Protein, Urine 4.2 Not Estab. mg/dL   Total Protein, Urine-Ur/day 122 30 - 150 mg/24 hr   Albumin, U 100.0 %   ALPHA 1 URINE 0.0 %   Alpha 2, Urine 0.0 %   % BETA, Urine 0.0 %   GAMMA GLOBULIN URINE 0.0 %   M-SPIKE %, Urine Not Observed Not Observed %   Immunofixation Result, Urine Comment    Note: Comment    Total Volume 2,900       Assessment & Plan:   Problem List Items Addressed This Visit      Digestive   Gastroparesis    Does not want to see nutrition. Discussed that we do not want her to eat a bland diet like she thought but it is more about how she eats to deal with this. She states that she wants to finish what's going on with hematology before she considers that. Heard from endocrine but doesn't want to see [facility], would like to see [facility]- referral redirected.          Other   Iron deficiency anemia    Continue to follow with hematology- they are working her up for monoclonal gammopathy. Seeing them again in 2 weeks.       Malnutrition ([place]) - Primary    Weight has stabilized and she states that her appetite is better. Continue to monitor.           Follow up plan: Return in about 4 weeks (around 04/15/2017) for Follow up- check on weight.

## 2017-03-18 NOTE — Assessment & Plan Note (Signed)
Does not want to see nutrition. Discussed that we do not want her to eat a bland diet like she thought but it is more about how she eats to deal with this. She states that she wants to finish what's going on with hematology before she considers that. Heard from endocrine but doesn't want to see [facility], would like to see [facility]- referral redirected.

## 2017-03-18 NOTE — Assessment & Plan Note (Signed)
Continue to follow with hematology- they are working her up for monoclonal gammopathy. Seeing them again in 2 weeks.

## 2017-03-30 ENCOUNTER — Inpatient Hospital Stay: Payer: PPO | Attending: Hematology and Oncology | Admitting: Hematology and Oncology

## 2017-03-30 ENCOUNTER — Encounter: Payer: Self-pay | Admitting: Hematology and Oncology

## 2017-03-30 VITALS — BP 136/80 | HR 76 | Temp 96.7°F | Resp 20 | Wt 188.3 lb

## 2017-03-30 DIAGNOSIS — Z803 Family history of malignant neoplasm of breast: Secondary | ICD-10-CM | POA: Diagnosis not present

## 2017-03-30 DIAGNOSIS — D3501 Benign neoplasm of right adrenal gland: Secondary | ICD-10-CM

## 2017-03-30 DIAGNOSIS — Z87891 Personal history of nicotine dependence: Secondary | ICD-10-CM | POA: Diagnosis not present

## 2017-03-30 DIAGNOSIS — D508 Other iron deficiency anemias: Secondary | ICD-10-CM | POA: Diagnosis not present

## 2017-03-30 DIAGNOSIS — Z7982 Long term (current) use of aspirin: Secondary | ICD-10-CM | POA: Insufficient documentation

## 2017-03-30 DIAGNOSIS — Z79899 Other long term (current) drug therapy: Secondary | ICD-10-CM | POA: Diagnosis not present

## 2017-03-30 DIAGNOSIS — Z86010 Personal history of colonic polyps: Secondary | ICD-10-CM | POA: Insufficient documentation

## 2017-03-30 DIAGNOSIS — Z801 Family history of malignant neoplasm of trachea, bronchus and lung: Secondary | ICD-10-CM | POA: Insufficient documentation

## 2017-03-30 DIAGNOSIS — Q2730 Arteriovenous malformation, site unspecified: Secondary | ICD-10-CM | POA: Diagnosis not present

## 2017-03-30 DIAGNOSIS — M25552 Pain in left hip: Secondary | ICD-10-CM | POA: Diagnosis not present

## 2017-03-30 DIAGNOSIS — E119 Type 2 diabetes mellitus without complications: Secondary | ICD-10-CM | POA: Insufficient documentation

## 2017-03-30 DIAGNOSIS — I10 Essential (primary) hypertension: Secondary | ICD-10-CM | POA: Diagnosis not present

## 2017-03-30 DIAGNOSIS — E039 Hypothyroidism, unspecified: Secondary | ICD-10-CM | POA: Insufficient documentation

## 2017-03-30 DIAGNOSIS — D509 Iron deficiency anemia, unspecified: Secondary | ICD-10-CM

## 2017-03-30 DIAGNOSIS — J449 Chronic obstructive pulmonary disease, unspecified: Secondary | ICD-10-CM | POA: Insufficient documentation

## 2017-03-30 DIAGNOSIS — R918 Other nonspecific abnormal finding of lung field: Secondary | ICD-10-CM | POA: Insufficient documentation

## 2017-03-30 DIAGNOSIS — D3502 Benign neoplasm of left adrenal gland: Secondary | ICD-10-CM | POA: Diagnosis not present

## 2017-03-30 NOTE — Progress Notes (Signed)
Patient here today for review of labs. She has had a recent flare of myclonic dystonia.

## 2017-03-30 NOTE — Progress Notes (Signed)
[facility] day:  03/30/2017   Chief Complaint: Leslie Olsen is an 66 y.o. female with iron deficiency anemia who seen for 1 month assessment.  HPI: The patient was last seen in the medical oncology clinic on 02/26/2017.  At that time, she felt "bad".  She described fatigue, hip pain, and weight loss.  She had undergone CTs, MRIs, and colonoscopy by GI.  She had some "precancerous polyps".  CBC on 02/25/2017 revealed a hematocrit of 36.5, hemoglobin 12.8, and MCV 90.5.  Ferritin was 50.  SPEP on 02/26/2017 revealed no monoclonal protein.  Immunoglobulin levels were normal.  Kappa free light chains were 25.6, lambda free light chains 15.8 with a ratio of 1.62 (normal).  24-hour urine revealed no monoclonal protein.  Low dose chest CT on 11/03/2016 revealed bilateral pulmonary nodules (largest 5.8 mm within the subpleural right middle lobe).  Abdomen and pelvis CT on 12/16/2016 at [facility] revealed a new 1.7 cm indeterminate low attenuation hepatic lesion in segment IVb.  Abdomen MRI on 01/05/2017 revealed that the previously described lesion was focal fatty infiltration.  There were bilateral adrenal adenomas.  Symptomatically, she feels "alright".  She states that she has been put on medications for her myoclonus.  Clonazepam did not work.  Keppra was added.  She feels better.  She notes that she has "shut down again" for the last 4 days.  Her left hip still hurts with walking.   Past Medical History:  Diagnosis Date  . Anemia   . Chronic headaches   . COPD (chronic obstructive pulmonary disease) ([place])   . Diabetes mellitus without complication ([place])   . GERD (gastroesophageal reflux disease)   . Hypertension   . Hypothyroidism     Past Surgical History:  Procedure Laterality Date  . ABDOMINAL HYSTERECTOMY    . BACK SURGERY    . CHOLECYSTECTOMY    . CORONARY STENT PLACEMENT    . ESOPHAGOGASTRODUODENOSCOPY (EGD) WITH PROPOFOL N/A 05/26/2016   Procedure: ESOPHAGOGASTRODUODENOSCOPY (EGD) WITH PROPOFOL;  Surgeon: Lucilla Lame, MD;  Location: [facility] ENDOSCOPY;  Service: Endoscopy;  Laterality: N/A;  . GIVENS CAPSULE STUDY N/A 05/05/2016   Procedure: GIVENS CAPSULE STUDY;  Surgeon: Lucilla Lame, MD;  Location: [facility] ENDOSCOPY;  Service: Endoscopy;  Laterality: N/A;  . REDUCTION MAMMAPLASTY    . REPLACEMENT TOTAL KNEE    . SHOULDER SURGERY    . TIBIA FRACTURE SURGERY      Family History  Problem Relation Age of Onset  . Lung cancer Maternal Aunt   . Breast cancer Maternal Aunt 57  . Other Mother        Killed in car accident, 27  . Other Father        Killed in car accident, 63  . Thyroid disease Brother   . Thyroid disease Sister     Social History:  reports that she quit smoking about 5 years ago. Her smoking use included Cigarettes. She has a 90.00 pack-year smoking history. She has never used smokeless tobacco. She reports that she does not drink alcohol or use drugs.  She lives in [place].  She is alone today.  Allergies: No Known Allergies  Current Medications: Current Outpatient Prescriptions  Medication Sig Dispense Refill  . albuterol (PROVENTIL HFA;VENTOLIN HFA) 108 (90 Base) MCG/ACT inhaler Inhale 2 puffs into the lungs every 6 (six) hours as needed for wheezing. 6.7 g 5  . aspirin 81 MG tablet Take 81 mg by mouth daily.    Marland Kitchen  budesonide-formoterol (SYMBICORT) 160-4.5 MCG/ACT inhaler Inhale 1 puff into the lungs 2 (two) times daily. 1 Inhaler 5  . clonazePAM (KLONOPIN) 0.5 MG tablet Take 0.5 mg by mouth at bedtime.     . ferrous fumarate (HEMOCYTE - 106 MG FE) 325 (106 FE) MG TABS tablet Take 1 tablet by mouth daily.    Marland Kitchen gabapentin (NEURONTIN) 300 MG capsule Take 1 capsule (300 mg total) by mouth 3 (three) times daily. 270 capsule 3  . glucose blood (COOL BLOOD GLUCOSE TEST STRIPS) test strip Use as instructed 100 each 12  . levETIRAcetam (KEPPRA) 250 MG tablet Take 250 mg by mouth.    . levothyroxine (SYNTHROID,  LEVOTHROID) 88 MCG tablet Take 1 tablet (88 mcg total) by mouth daily. Brand name required 30 tablet 1  . lisinopril (PRINIVIL,ZESTRIL) 5 MG tablet Take 1 tablet (5 mg total) by mouth daily. 90 tablet 1  . metFORMIN (GLUCOPHAGE) 500 MG tablet Take 2 tablets (1,000 mg total) by mouth daily with breakfast. 360 tablet 1  . SUMAtriptan (IMITREX) 100 MG tablet Take 100 mg by mouth every 2 (two) hours as needed for migraine. May repeat in 2 hours if headache persists or recurs.     No current facility-administered medications for this visit.     Review of Systems:  GENERAL:  Feels "alright".  No fevers or sweats.  Weight up 1 pound. PERFORMANCE STATUS (ECOG):  2 HEENT:  No visual changes, runny nose, sore throat, mouth sores or tenderness. Lungs:  COPD.  Acute on chronic shortness of breath.  No cough.  No pleuritic chest pain.  No hemoptysis.  Uses CPAP. Cardiac:  No chest pain, palpitations, or PND.  Stress test and EKG negative. GI:  No nausea, vomiting, diarrhea, constipation, melena or hematochezia. GU:  No urgency, frequency, dysuria, or hematuria. Musculoskeletal:  No back pain. LEFT hip pain.  No other joint pain.  No muscle tenderness. Extremities:  No pain or swelling. Skin:  No rashes or skin changes. Neuro:  Headaches, left side of head.  Myoclonic dystonia.  No numbness or weakness, balance or coordination issues. Endocrine:  Hypothyroid on Synthroid.  Diabetes.  No hot flashes or night sweats. Psych:  No mood changes, depression or anxiety. Pain:  No focal pain. Review of systems:  All other systems reviewed and found to be negative.   Physical Exam: Blood pressure 136/80, pulse 76, temperature (!) 96.7 F (35.9 C), temperature source Tympanic, resp. rate 20, weight 188 lb 5 oz (85.4 kg).  GENERAL:  Well developed, well nourished, woman sitting comfortably in the exam room in no acute distress. MENTAL STATUS:  Alert and oriented to person, place and time. HEAD:  Short gray hair.   Normocephalic, atraumatic, face symmetric, no Cushingoid features. EYES:  Gold rimmed glasses.  Blue eyes. No conjunctivitis or scleral icterus. ENT:  Strained speech.  Speaks in short sentences  NEUROLOGICAL: Unremarkable. PSYCH:  Appropriate.    No visits with results within 3 Day(s) from this visit.  Latest known visit with results is:  Orders Only on 03/01/2017  Component Date Value Ref Range Status  . Total Protein, Urine 03/01/2017 4.2  Not Estab. mg/dL Final  . Total Protein, Urine-Ur/day 03/01/2017 122  30 - 150 mg/24 hr Final  . Albumin, U 03/01/2017 100.0  % Final  . ALPHA 1 URINE 03/01/2017 0.0  % Final  . Alpha 2, Urine 03/01/2017 0.0  % Final  . % BETA, Urine 03/01/2017 0.0  % Final  . GAMMA  GLOBULIN URINE 03/01/2017 0.0  % Final  . M-SPIKE %, Urine 03/01/2017 Not Observed  Not Observed % Final  . Immunofixation Result, Urine 03/01/2017 Comment   Final   An apparent normal immunofixation pattern.  . Note: 03/01/2017 Comment   Final   Comment: (NOTE) Protein electrophoresis scan will follow via computer, mail, or courier delivery. Performed At: [facility] [place], [place] [id number] Lindon Romp MD DG:[id number]   . Total Volume 03/01/2017 2900   Final    Assessment:  Leslie Olsen is an 66 y.o. female with recurrent iron deficiency anemia secondary to AVMs..    EGD on 06/05/2014 revealed a hiatal hernia and gastritis.  Colonoscopy on 06/05/2014 noted revealed a poor prep.  There was a 7 mm polyp in the transverse colon and non-bleeding internal hemorrhoids.  Capsule study on 05/26/2016 revealed   Multiple angiodysplastic lesion with bleeding in the entire duodenum and  jejunum.  She had grade A esophagitis.  Her diet is good.  She denies any melena or hematochezia.  She denies any uterine bleeding.    She has a 90 pack year smoking history.  She has COPD.  Chest CT on 08/23/2013 revealed a questionable lesion at the pleura.  Chest CT  on 08/23/2014 suggested benign etiology with similar scattered pulmonary nodules, unchanged ground glass opacity posterior right upper lobe, subtle subpleural reticulation, and similar low density enlargement of both adrenals.  CXR on 11/27/2015 revealed no active cardiopulmonary disease.  She has mild stable COPD per Dr. Stevenson Clinch on 01/07/2016.  Cardiology evaluation revealed a normal exercise nuclear stress test on 12/18/2015 and echocardiogram on 11/2015 (EF 60-65%).  She has sleep apnea and is using her CPAP again.  She was on oral iron x 2 years.  She developed recurrent iron deficiency anemia.  She denies any melena or hematochezia.  She received Venofer 600 mg (10/29/2015 - 11/12/2015) and 400 mg (01/29/2016 - 02/05/2016), 200 mg (04/23/2016, 05/07/2016, 10/29/2016, 11/05/2016, 11/27/2016).  Popcorn and ice pica have resolved.    Ferritin has been followed: 15 on 01/14/2015, 7 on 10/22/2015, 73 on 11/19/2015, 15 on 01/22/2016, 47 on 03/04/2016, 18 on 04/16/2016, 17 on 07/14/2016, 25 on 09/03/2016, 10 on 10/28/2016, 47 on 11/27/2016, 71 on 12/29/2016, 83 on 02/16/2017, and 50 on 02/25/2017.  She receives Venofer if her ferritin is < 30.  She has had left hip and right flank pain.  CT scan, MRI, and colonoscopy performed in May 2018 revealed benign findings.  She has benign adrenal adenomas. CT and MRI demonstrated benign findings with bilateral adrenal adenomas  SPEP, UPEP, and free light chains were negative on 02/25/2017.  She has myoclonus of respiratory muscles.  She is followed by neurology.  She has been treated with Klonopin and Keppra.   Symptomatically, she has a flare of her myclonous.  Exam reveals strained speech.  Hematocrit is 36.5, hemoglobin 12.8, and platelets 248,000.  Ferritin is 50.  Plan: 1.  Review labs from 02/26/2017.  SPEP, free light chains, and 24 hour UPEP without monoclonal protein.  Ferritin was adequate (51).  No need for IV iron. 2.  Etiology of hip pain unclear.   Consider imaging. 3.  RTC on 09/21 for labs (CBC with diff, ferritin) 4.  RTC on 12/21 for MD assessment, labs (CBC with diff, ferritin- day before) +/- Venofer.   Lequita Asal, MD  03/30/2017 , 11:50 AM

## 2017-04-08 ENCOUNTER — Other Ambulatory Visit: Payer: Self-pay | Admitting: Family Medicine

## 2017-04-16 ENCOUNTER — Encounter: Payer: Self-pay | Admitting: Hematology and Oncology

## 2017-04-16 ENCOUNTER — Telehealth: Payer: Self-pay | Admitting: *Deleted

## 2017-04-16 ENCOUNTER — Encounter: Payer: Self-pay | Admitting: Family Medicine

## 2017-04-16 ENCOUNTER — Ambulatory Visit (INDEPENDENT_AMBULATORY_CARE_PROVIDER_SITE_OTHER): Payer: PPO | Admitting: Family Medicine

## 2017-04-16 VITALS — BP 148/82 | HR 70 | Temp 98.3°F | Wt 186.5 lb

## 2017-04-16 DIAGNOSIS — G253 Myoclonus: Secondary | ICD-10-CM | POA: Diagnosis not present

## 2017-04-16 DIAGNOSIS — J383 Other diseases of vocal cords: Secondary | ICD-10-CM

## 2017-04-16 DIAGNOSIS — M25551 Pain in right hip: Secondary | ICD-10-CM

## 2017-04-16 DIAGNOSIS — K3184 Gastroparesis: Secondary | ICD-10-CM

## 2017-04-16 NOTE — Telephone Encounter (Signed)
Number is on the note [id number]. They can see EPIC

## 2017-04-16 NOTE — Telephone Encounter (Addendum)
Jada form [employer] called to request an update on the plan of care for Leslie Olsen. Please advise as 03/30/17 note is incomplete

## 2017-04-16 NOTE — Telephone Encounter (Signed)
  Note in computer.  Do we have a number to call or fax the note?  M

## 2017-04-16 NOTE — Progress Notes (Addendum)
BP (!) 148/82 (BP Location: Left Arm, Patient Position: Sitting, Cuff Size: Normal)   Pulse 70   Temp 98.3 F (36.8 C)   Wt 186 lb 8 oz (84.6 kg)   SpO2 99%   BMI 30.10 kg/m    Subjective:    Patient ID: Leslie Olsen, female    DOB: [birth year], 66 y.o.   MRN: [id number]  HPI: Leslie Olsen is a 66 y.o. female  Chief Complaint  Patient presents with  . Weight Check  . Other    Patient states that she takes her Metforim when her sugar is high   Has lost another 2 lbs. Following with hematology for work up for monoclonal gammopathy- they were happy that her labs seemed to come back normal. She has been having pain in her R hip, she has mentioned this to oncology, but they think that it is musculoskeletal. They recommended following up with imaging if her pain does not improve. She notes that her nausea has been better. Has been taking a vitamin, and she feels like this has been helping. She has not been eating smaller meals. She does note that she has been having an exacerbation of her myoclonus. She is due to see her neurologist again in a couple of weeks. She does not want to go up on her medication at this time, because it makes her sleepy.  She is due to see her endocrinologist at the beginning of September, she's looking forward to that. She does note that she has not been taking her metformin daily. She is otherwise doing well with no other concerns today.   Relevant past medical, surgical, family and social history reviewed and updated as indicated. Interim medical history since our last visit reviewed. Allergies and medications reviewed and updated.  Review of Systems  Constitutional: Negative.   HENT: Positive for voice change.   Respiratory: Negative.   Cardiovascular: Negative.   Musculoskeletal: Positive for arthralgias. Negative for back pain, gait problem, joint swelling, myalgias, neck pain and neck stiffness.  Psychiatric/Behavioral: Negative for agitation,  behavioral problems, confusion, decreased concentration, dysphoric mood, hallucinations, self-injury, sleep disturbance and suicidal ideas. The patient is nervous/anxious. The patient is not hyperactive.     Per HPI unless specifically indicated above     Objective:    BP (!) 148/82 (BP Location: Left Arm, Patient Position: Sitting, Cuff Size: Normal)   Pulse 70   Temp 98.3 F (36.8 C)   Wt 186 lb 8 oz (84.6 kg)   SpO2 99%   BMI 30.10 kg/m   Wt Readings from Last 3 Encounters:  04/16/17 186 lb 8 oz (84.6 kg)  03/30/17 188 lb 5 oz (85.4 kg)  03/18/17 187 lb (84.8 kg)    Physical Exam  Constitutional: She is oriented to person, place, and time. She appears well-developed and well-nourished. No distress.  HENT:  Head: Normocephalic and atraumatic.  Right Ear: Hearing normal.  Left Ear: Hearing normal.  Nose: Nose normal.  Eyes: Conjunctivae and lids are normal. Right eye exhibits no discharge. Left eye exhibits no discharge. No scleral icterus.  Cardiovascular: Normal rate, regular rhythm, normal heart sounds and intact distal pulses.  Exam reveals no gallop and no friction rub.   No murmur heard. Pulmonary/Chest: Effort normal. No respiratory distress. She has no wheezes. She has no rales. She exhibits no tenderness.  Abdominal: Soft. Bowel sounds are normal. She exhibits no distension and no mass. There is no tenderness. There is no rebound and  no guarding.  Genitourinary: Vagina normal. Rectal exam shows guaiac negative stool.  Musculoskeletal: Normal range of motion.  Neurological: She is alert and oriented to person, place, and time.  Skin: Skin is warm and intact. No rash noted. She is not diaphoretic. No erythema. No pallor.  Psychiatric: She has a normal mood and affect. Her speech is normal and behavior is normal. Judgment and thought content normal. Cognition and memory are normal.  Nursing note and vitals reviewed.   Results for orders placed or performed in visit on  03/01/17  IFE+PROTEIN ELECTRO, 24-HR UR  Result Value Ref Range   Total Protein, Urine 4.2 Not Estab. mg/dL   Total Protein, Urine-Ur/day 122 30 - 150 mg/24 hr   Albumin, U 100.0 %   ALPHA 1 URINE 0.0 %   Alpha 2, Urine 0.0 %   % BETA, Urine 0.0 %   GAMMA GLOBULIN URINE 0.0 %   M-SPIKE %, Urine Not Observed Not Observed %   Immunofixation Result, Urine Comment    Note: Comment    Total Volume 2,900       Assessment & Plan:   Problem List Items Addressed This Visit      Digestive   Gastroparesis    Still no better. Again recommended small often meals. See endocrinology in about a month. Call with any concerns.         Nervous and Auditory   Dysphonia spastica - Primary    In exacerbation at this time. Does not want to go up on her klonopin at this time. Advised her to follow up with neurology- she is scheduled to see them shortly. Continue to monitor.       Myoclonus    In exacerbation at this time. Does not want to go up on her klonopin at this time. Advised her to follow up with neurology- she is scheduled to see them shortly. Continue to monitor.        Other Visit Diagnoses    Right hip pain       Will obtain x-ray. Ordered today. If not improving, may need PT vs ortho referral.    Relevant Orders   DG HIP UNILAT WITH PELVIS 2-3 VIEWS RIGHT       Follow up plan: Return 3-4 weeks, , for follow up breathing/neuro.

## 2017-04-19 NOTE — Telephone Encounter (Signed)
All set. Thanks!

## 2017-04-19 NOTE — Telephone Encounter (Signed)
Please see message that was received back in regards to plan of care that I called about on pt.

## 2017-04-20 ENCOUNTER — Telehealth: Payer: Self-pay | Admitting: Family Medicine

## 2017-04-20 NOTE — Assessment & Plan Note (Signed)
In exacerbation at this time. Does not want to go up on her klonopin at this time. Advised her to follow up with neurology- she is scheduled to see them shortly. Continue to monitor.

## 2017-04-20 NOTE — Assessment & Plan Note (Signed)
Still no better. Again recommended small often meals. See endocrinology in about a month. Call with any concerns.

## 2017-04-20 NOTE — Telephone Encounter (Signed)
Patient wanted to know if she needs to go to her hematology appointment regarding her bone density.  Please Advise.  Thank you

## 2017-04-21 NOTE — Telephone Encounter (Signed)
Patient states that some of the blood work that she had done was high and suggestive of Multiple Myeloma. She would like to know if you were able to find out if she needs a bone scan.   Verbal from Dr.Johnson, that the cancer doctor states that if the pain continues she believes that is is musculoskeletal and that she would need imaging.   Order for hip x-ray in.  Patient notified.

## 2017-04-21 NOTE — Telephone Encounter (Signed)
Routing to provider  

## 2017-04-21 NOTE — Telephone Encounter (Signed)
If it's for her hip pain, she needs an x-ray. Order is in. She can get it whenever she wants

## 2017-04-21 NOTE — Addendum Note (Signed)
Addended by: Valerie Roys on: 04/21/2017 08:12 AM   Modules accepted: Orders

## 2017-05-03 ENCOUNTER — Ambulatory Visit
Admission: RE | Admit: 2017-05-03 | Discharge: 2017-05-03 | Disposition: A | Discharge status: Home / Self Care | Payer: MEDICARE | Attending: Neurology | Admitting: Neurology

## 2017-05-03 DIAGNOSIS — D649 Anemia, unspecified: Secondary | ICD-10-CM

## 2017-05-03 DIAGNOSIS — M4802 Spinal stenosis, cervical region: Secondary | ICD-10-CM

## 2017-05-03 DIAGNOSIS — G253 Myoclonus: Principal | ICD-10-CM

## 2017-05-03 DIAGNOSIS — Z6830 Body mass index (BMI) 30.0-30.9, adult: Secondary | ICD-10-CM | POA: Diagnosis not present

## 2017-05-03 MED ORDER — LEVETIRACETAM 500 MG TABLET
ORAL_TABLET | Freq: Two times a day (BID) | ORAL | 5 refills | 0.00000 days | Status: CP
Start: 2017-05-03 — End: 2018-02-09

## 2017-05-06 LAB — TSH: TSH: 0.22 — AB (ref <=5.90)

## 2017-05-07 ENCOUNTER — Ambulatory Visit
Admission: RE | Admit: 2017-05-07 | Discharge: 2017-05-07 | Disposition: A | Discharge status: Home / Self Care | Payer: MEDICARE

## 2017-05-07 DIAGNOSIS — E119 Type 2 diabetes mellitus without complications: Principal | ICD-10-CM

## 2017-05-07 DIAGNOSIS — E039 Hypothyroidism, unspecified: Secondary | ICD-10-CM

## 2017-05-07 DIAGNOSIS — M62838 Other muscle spasm: Secondary | ICD-10-CM

## 2017-05-07 DIAGNOSIS — D649 Anemia, unspecified: Secondary | ICD-10-CM

## 2017-05-07 DIAGNOSIS — G253 Myoclonus: Secondary | ICD-10-CM

## 2017-05-07 DIAGNOSIS — E441 Mild protein-calorie malnutrition: Secondary | ICD-10-CM

## 2017-05-07 DIAGNOSIS — E1143 Type 2 diabetes mellitus with diabetic autonomic (poly)neuropathy: Secondary | ICD-10-CM | POA: Diagnosis not present

## 2017-05-07 DIAGNOSIS — E278 Other specified disorders of adrenal gland: Secondary | ICD-10-CM | POA: Diagnosis not present

## 2017-05-07 DIAGNOSIS — Z87891 Personal history of nicotine dependence: Secondary | ICD-10-CM | POA: Diagnosis not present

## 2017-05-07 DIAGNOSIS — Z79899 Other long term (current) drug therapy: Secondary | ICD-10-CM | POA: Diagnosis not present

## 2017-05-07 DIAGNOSIS — G43909 Migraine, unspecified, not intractable, without status migrainosus: Secondary | ICD-10-CM | POA: Diagnosis not present

## 2017-05-07 DIAGNOSIS — I2510 Atherosclerotic heart disease of native coronary artery without angina pectoris: Secondary | ICD-10-CM | POA: Diagnosis not present

## 2017-05-07 DIAGNOSIS — Z7951 Long term (current) use of inhaled steroids: Secondary | ICD-10-CM | POA: Diagnosis not present

## 2017-05-07 DIAGNOSIS — J449 Chronic obstructive pulmonary disease, unspecified: Secondary | ICD-10-CM | POA: Diagnosis not present

## 2017-05-07 DIAGNOSIS — Z7984 Long term (current) use of oral hypoglycemic drugs: Secondary | ICD-10-CM | POA: Diagnosis not present

## 2017-05-07 DIAGNOSIS — Z7982 Long term (current) use of aspirin: Secondary | ICD-10-CM | POA: Diagnosis not present

## 2017-05-07 DIAGNOSIS — I10 Essential (primary) hypertension: Secondary | ICD-10-CM | POA: Diagnosis not present

## 2017-05-27 ENCOUNTER — Ambulatory Visit
Admission: RE | Admit: 2017-05-27 | Discharge: 2017-06-09 | Disposition: A | Discharge status: Home / Self Care | Payer: MEDICARE

## 2017-05-27 DIAGNOSIS — R112 Nausea with vomiting, unspecified: Principal | ICD-10-CM

## 2017-05-27 DIAGNOSIS — E118 Type 2 diabetes mellitus with unspecified complications: Secondary | ICD-10-CM

## 2017-05-27 DIAGNOSIS — R933 Abnormal findings on diagnostic imaging of other parts of digestive tract: Secondary | ICD-10-CM | POA: Diagnosis not present

## 2017-05-27 IMAGING — MG MM DIGITAL DIAGNOSTIC BILAT W/ TOMO W/ CAD
8 of 16 series · 8 of 36 positions shown · non-contrast
Comparison: Previous exam(s).

CLINICAL DATA: 65-year-old female presenting for evaluation of a
palpable area of thickening in the superior left breast. The patient
has felt this for about 3 months.

EXAM:
2D DIGITAL DIAGNOSTIC BILATERAL MAMMOGRAM WITH CAD AND ADJUNCT TOMO
LEFT BREAST ULTRASOUND

[R CC]
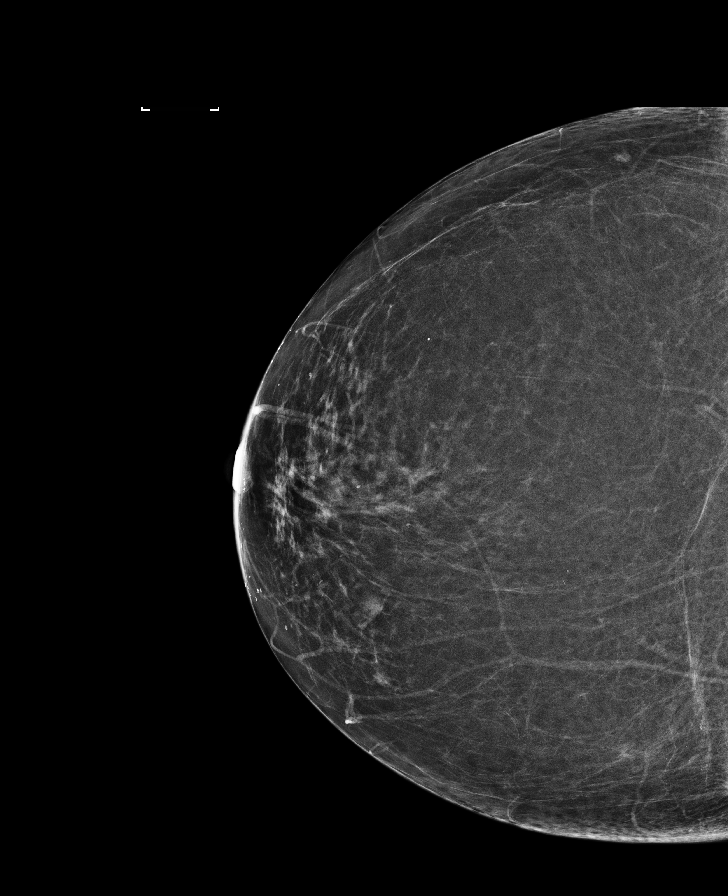

[L CC]
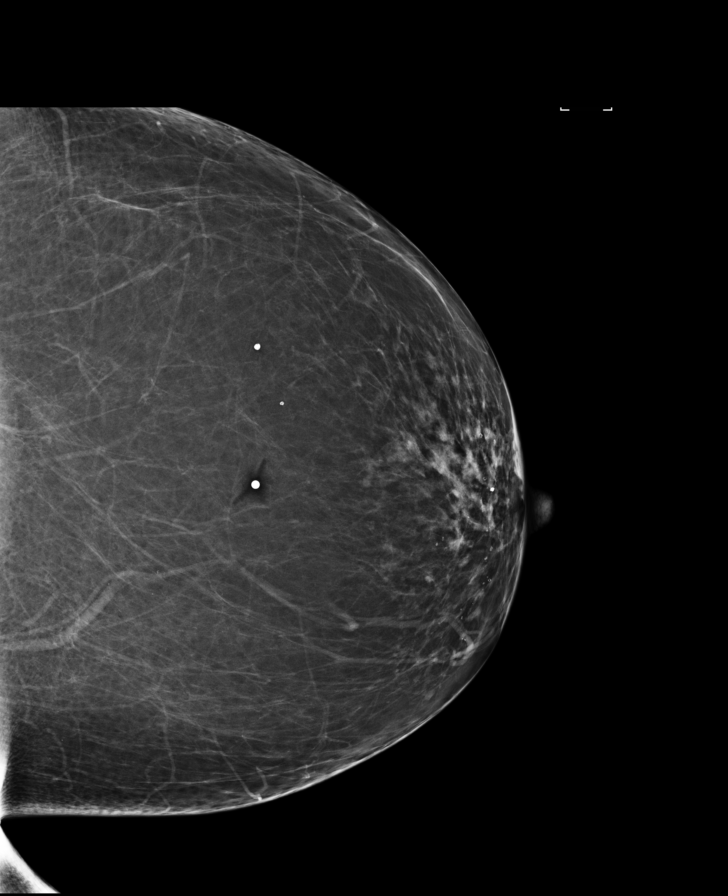

[L ML]
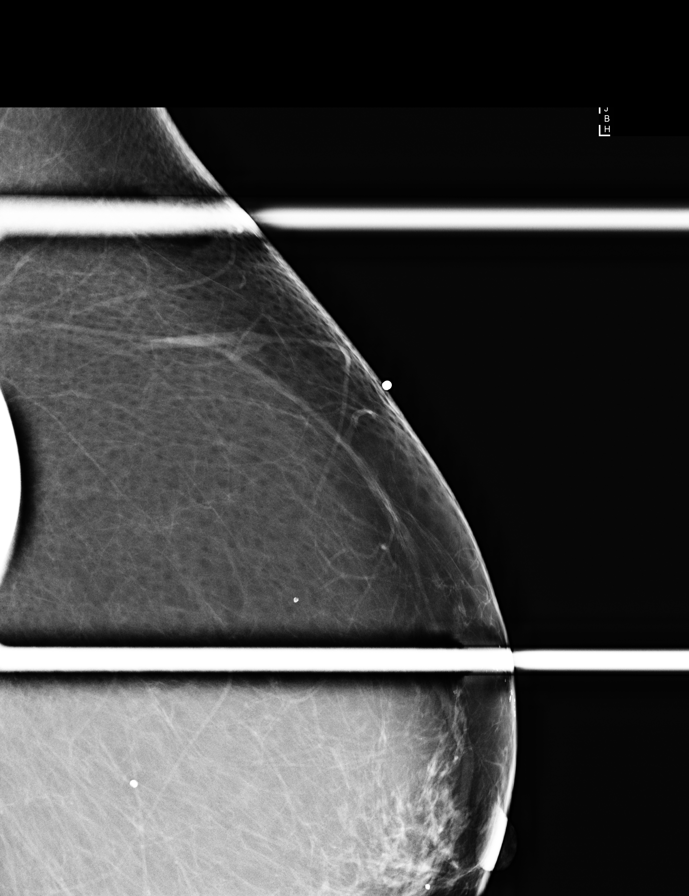

[L ML synth-2D]
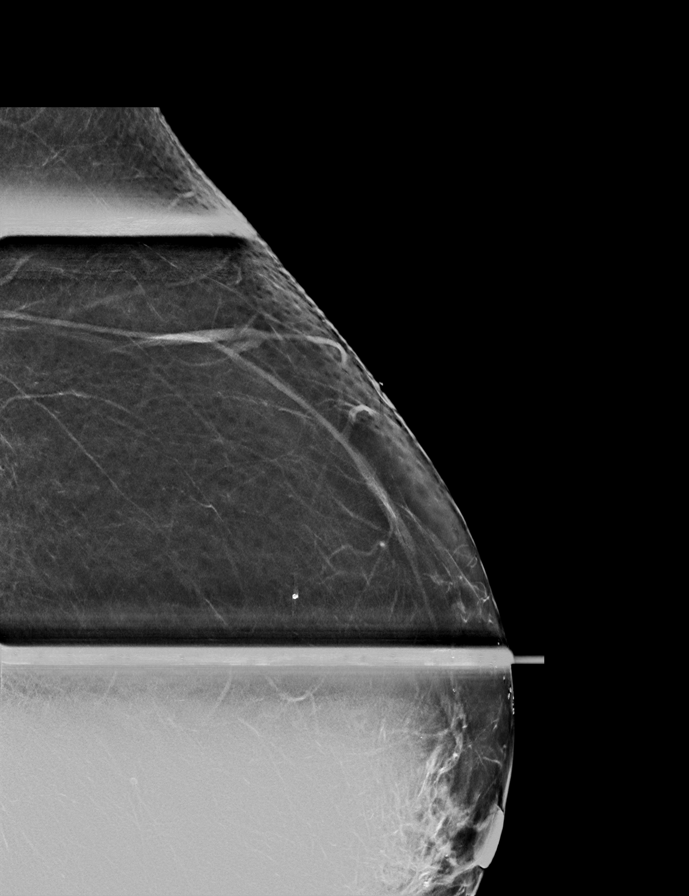

[L MLO]
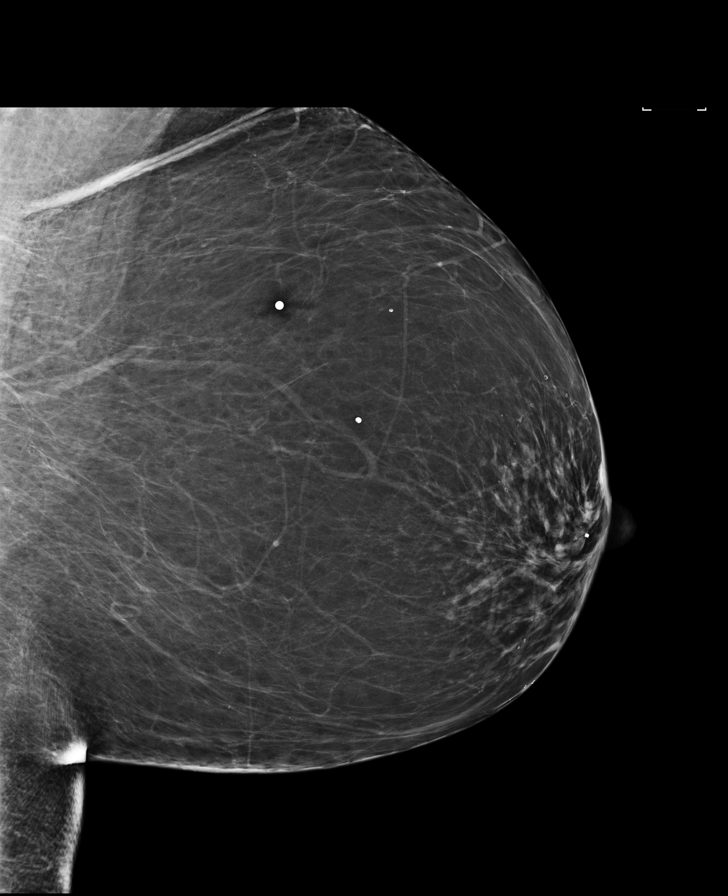

[L MLO synth-2D]
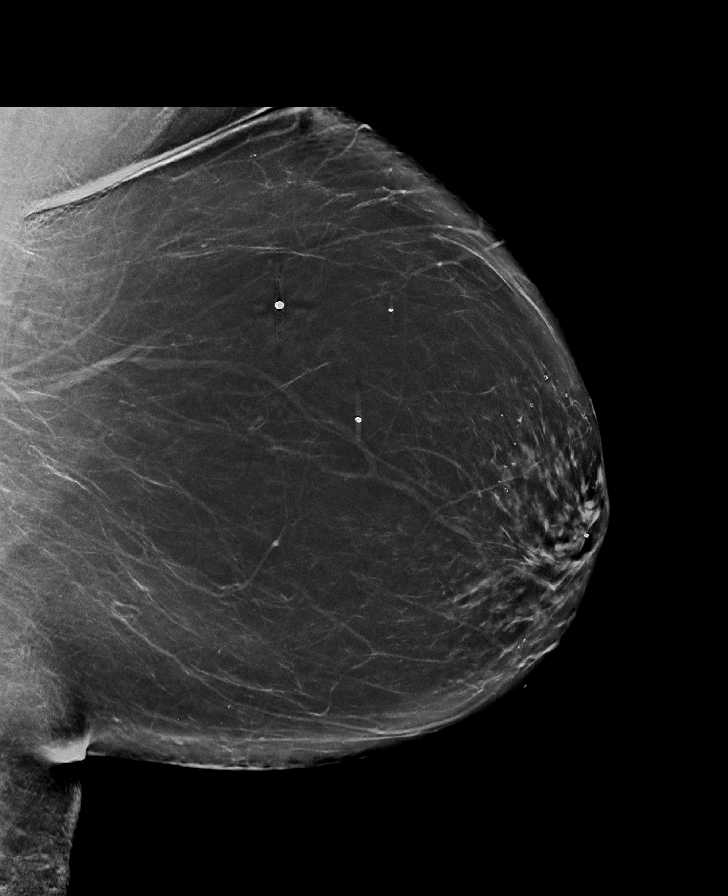

[L CC synth-2D]
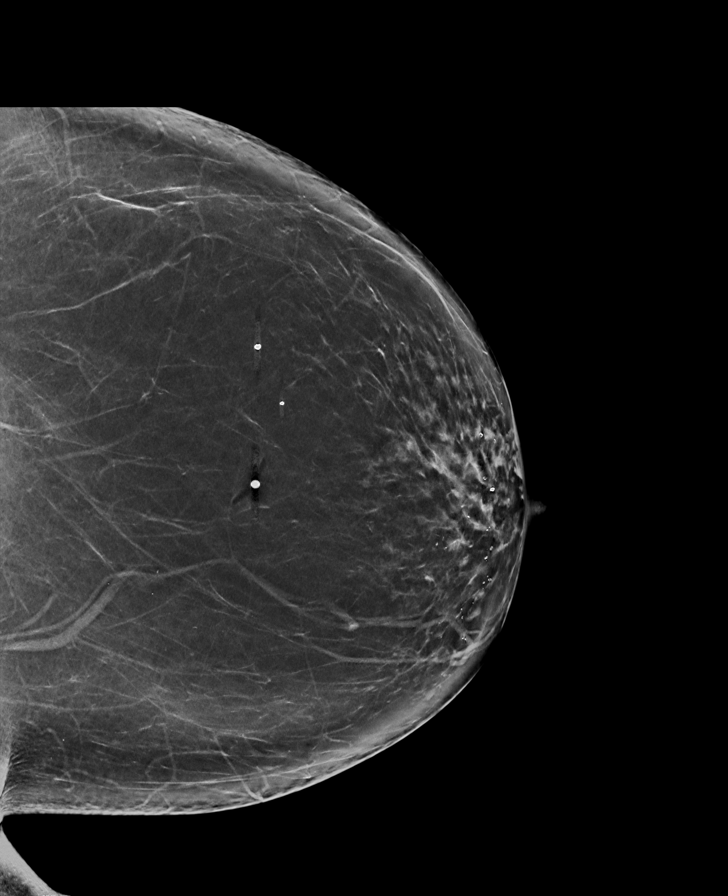

[R MLO synth-2D]
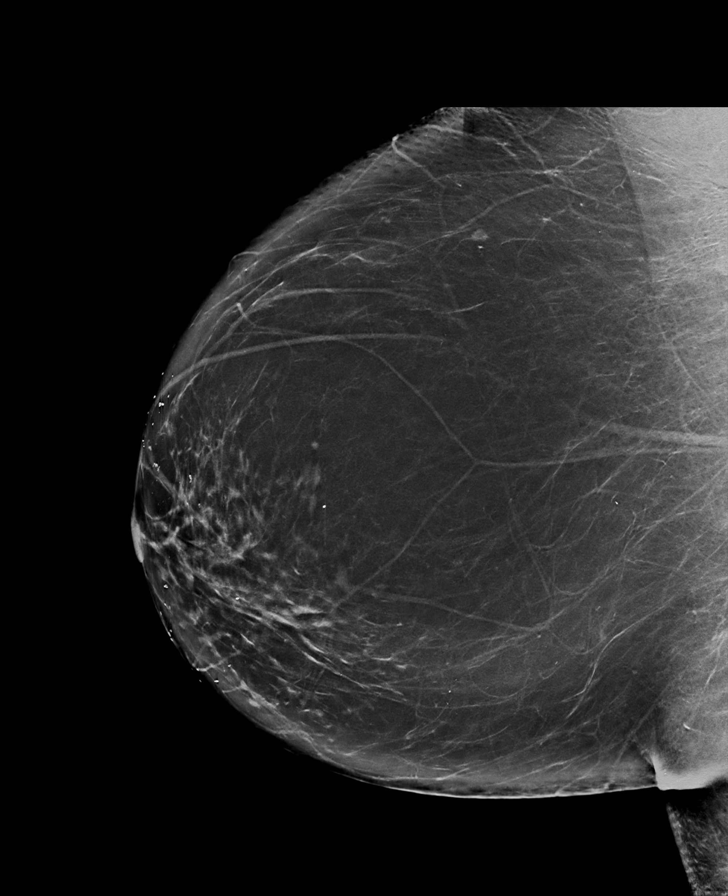

[8 of 36 positions shown; findings below may reference images not displayed]

ACR Breast Density Category b: There are scattered areas of
fibroglandular density.
FINDINGS: A BB has been placed on the superior aspect of the left breast
denoting the palpable area of thickening. There are no suspicious
mammographic findings deep to the palpable marker. No suspicious
calcifications, masses or areas of distortion are seen in the
bilateral breasts.

Mammographic images were processed with CAD.

Physical exam of the palpable area of concern demonstrates no
discrete palpable masses.

Ultrasound targeted to the 12 to 1 o'clock position in the left
breast demonstrates normal fibroglandular tissue. No masses or
suspicious areas of shadowing are identified.
IMPRESSION: 1. There is no mammographic or targeted sonographic abnormality at
the palpable site of concern.

2.  No mammographic evidence of malignancy in the bilateral breasts.

RECOMMENDATION:
1. Clinical follow-up recommended for the palpable area of concern
in the left breast. Any further workup should be based on clinical
grounds.

2.  Screening mammogram in one year.(Code:UO-I-L8N)

I have discussed the findings and recommendations with the patient.
Results were also provided in writing at the conclusion of the
visit. If applicable, a reminder letter will be sent to the patient
regarding the next appointment.

BI-RADS CATEGORY  1: Negative.

## 2017-05-27 MED ORDER — DOXYCYCLINE HYCLATE 100 MG TABLET,DELAYED RELEASE
ORAL_TABLET | Freq: Two times a day (BID) | ORAL | 0 refills | 0 days | Status: CP
Start: 2017-05-27 — End: 2017-06-06

## 2017-05-28 ENCOUNTER — Inpatient Hospital Stay: Payer: PPO | Attending: Hematology and Oncology

## 2017-05-28 DIAGNOSIS — D508 Other iron deficiency anemias: Secondary | ICD-10-CM | POA: Diagnosis not present

## 2017-05-28 DIAGNOSIS — R918 Other nonspecific abnormal finding of lung field: Secondary | ICD-10-CM | POA: Diagnosis not present

## 2017-05-28 DIAGNOSIS — D509 Iron deficiency anemia, unspecified: Secondary | ICD-10-CM

## 2017-05-28 DIAGNOSIS — Q2730 Arteriovenous malformation, site unspecified: Secondary | ICD-10-CM | POA: Diagnosis not present

## 2017-05-28 DIAGNOSIS — D3502 Benign neoplasm of left adrenal gland: Secondary | ICD-10-CM | POA: Diagnosis not present

## 2017-05-28 DIAGNOSIS — D3501 Benign neoplasm of right adrenal gland: Secondary | ICD-10-CM | POA: Insufficient documentation

## 2017-05-28 LAB — CBC WITH DIFFERENTIAL/PLATELET
Basophils Absolute: 0.1 10*3/uL (ref 0–0.1)
Basophils Relative: 1 %
Eosinophils Absolute: 0.3 10*3/uL (ref 0–0.7)
Eosinophils Relative: 5 %
HCT: 38.1 % (ref 35.0–47.0)
Hemoglobin: 13.4 g/dL (ref 12.0–16.0)
Lymphocytes Relative: 36 %
Lymphs Abs: 2.2 10*3/uL (ref 1.0–3.6)
MCH: 32.1 pg (ref 26.0–34.0)
MCHC: 35.3 g/dL (ref 32.0–36.0)
MCV: 90.9 fL (ref 80.0–100.0)
Monocytes Absolute: 0.5 10*3/uL (ref 0.2–0.9)
Monocytes Relative: 9 %
Neutro Abs: 3 10*3/uL (ref 1.4–6.5)
Neutrophils Relative %: 49 %
Platelets: 239 10*3/uL (ref 150–440)
RBC: 4.19 MIL/uL (ref 3.80–5.20)
RDW: 13 % (ref 11.5–14.5)
WBC: 6.2 10*3/uL (ref 3.6–11.0)

## 2017-05-28 LAB — FERRITIN: Ferritin: 32 ng/mL (ref 11–307)

## 2017-06-01 ENCOUNTER — Telehealth: Payer: Self-pay | Admitting: *Deleted

## 2017-06-01 NOTE — Telephone Encounter (Signed)
Patient came by the clinic this morning to request a copy of her lab results.  Patient stated she has lost 26 pounds since March and is very weak. She states she does not have a return appointment until December.  I informed her that her labs here are normal.  I asked if she would like to be seen and she declined.  She states she has been diagnosed twice in the past few months with different types of tick fever.  She states she is currently taking doxycycline. I informed her that Dr. Mike Gip would be willing to evaluate her but she declined.

## 2017-06-02 ENCOUNTER — Ambulatory Visit
Admission: RE | Admit: 2017-06-02 | Discharge: 2017-06-02 | Disposition: A | Discharge status: Home / Self Care | Payer: MEDICARE

## 2017-06-02 DIAGNOSIS — R918 Other nonspecific abnormal finding of lung field: Secondary | ICD-10-CM

## 2017-06-02 DIAGNOSIS — R634 Abnormal weight loss: Secondary | ICD-10-CM

## 2017-06-02 DIAGNOSIS — I10 Essential (primary) hypertension: Principal | ICD-10-CM

## 2017-06-02 DIAGNOSIS — A7740 Ehrlichiosis, unspecified: Secondary | ICD-10-CM

## 2017-06-02 DIAGNOSIS — D649 Anemia, unspecified: Secondary | ICD-10-CM

## 2017-06-02 DIAGNOSIS — K5520 Angiodysplasia of colon without hemorrhage: Secondary | ICD-10-CM

## 2017-06-02 DIAGNOSIS — E119 Type 2 diabetes mellitus without complications: Secondary | ICD-10-CM

## 2017-06-02 DIAGNOSIS — Z6830 Body mass index (BMI) 30.0-30.9, adult: Secondary | ICD-10-CM | POA: Diagnosis not present

## 2017-06-02 LAB — BASIC METABOLIC PANEL
BUN: 17 (ref 4–21)
CREATININE: 0.8 (ref 0.5–1.1)
Glucose: 103
Potassium: 4.4 (ref 3.4–5.3)
Sodium: 143 (ref 137–147)

## 2017-06-14 ENCOUNTER — Ambulatory Visit
Admission: RE | Admit: 2017-06-14 | Discharge: 2017-06-14 | Disposition: A | Discharge status: Home / Self Care | Payer: MEDICARE

## 2017-06-14 DIAGNOSIS — D649 Anemia, unspecified: Principal | ICD-10-CM

## 2017-06-14 DIAGNOSIS — R634 Abnormal weight loss: Secondary | ICD-10-CM

## 2017-06-14 DIAGNOSIS — R918 Other nonspecific abnormal finding of lung field: Principal | ICD-10-CM

## 2017-07-14 ENCOUNTER — Ambulatory Visit: Admission: RE | Admit: 2017-07-14 | Discharge: 2017-07-14 | Payer: MEDICARE

## 2017-07-14 DIAGNOSIS — Z9989 Dependence on other enabling machines and devices: Secondary | ICD-10-CM

## 2017-07-14 DIAGNOSIS — G4733 Obstructive sleep apnea (adult) (pediatric): Secondary | ICD-10-CM

## 2017-07-14 DIAGNOSIS — I10 Essential (primary) hypertension: Secondary | ICD-10-CM

## 2017-07-14 DIAGNOSIS — R5382 Chronic fatigue, unspecified: Principal | ICD-10-CM

## 2017-07-19 ENCOUNTER — Ambulatory Visit
Admission: RE | Admit: 2017-07-19 | Discharge: 2017-07-19 | Disposition: A | Discharge status: Home / Self Care | Payer: MEDICARE

## 2017-07-19 DIAGNOSIS — R5382 Chronic fatigue, unspecified: Principal | ICD-10-CM

## 2017-08-10 ENCOUNTER — Ambulatory Visit
Admission: RE | Admit: 2017-08-10 | Discharge: 2017-08-10 | Disposition: A | Discharge status: Home / Self Care | Payer: MEDICARE

## 2017-08-10 DIAGNOSIS — R5383 Other fatigue: Principal | ICD-10-CM

## 2017-08-10 DIAGNOSIS — Z79899 Other long term (current) drug therapy: Secondary | ICD-10-CM | POA: Diagnosis not present

## 2017-08-10 DIAGNOSIS — E039 Hypothyroidism, unspecified: Secondary | ICD-10-CM | POA: Diagnosis not present

## 2017-08-10 DIAGNOSIS — R42 Dizziness and giddiness: Secondary | ICD-10-CM | POA: Diagnosis not present

## 2017-08-10 DIAGNOSIS — J449 Chronic obstructive pulmonary disease, unspecified: Secondary | ICD-10-CM | POA: Diagnosis not present

## 2017-08-10 DIAGNOSIS — R630 Anorexia: Secondary | ICD-10-CM | POA: Diagnosis not present

## 2017-08-10 DIAGNOSIS — Z7951 Long term (current) use of inhaled steroids: Secondary | ICD-10-CM | POA: Diagnosis not present

## 2017-08-10 DIAGNOSIS — I10 Essential (primary) hypertension: Secondary | ICD-10-CM | POA: Diagnosis not present

## 2017-08-10 DIAGNOSIS — E119 Type 2 diabetes mellitus without complications: Secondary | ICD-10-CM | POA: Diagnosis not present

## 2017-08-10 DIAGNOSIS — G43909 Migraine, unspecified, not intractable, without status migrainosus: Secondary | ICD-10-CM | POA: Diagnosis not present

## 2017-08-10 DIAGNOSIS — I2510 Atherosclerotic heart disease of native coronary artery without angina pectoris: Secondary | ICD-10-CM | POA: Diagnosis not present

## 2017-08-10 DIAGNOSIS — E785 Hyperlipidemia, unspecified: Secondary | ICD-10-CM | POA: Diagnosis not present

## 2017-08-10 DIAGNOSIS — Z7982 Long term (current) use of aspirin: Secondary | ICD-10-CM | POA: Diagnosis not present

## 2017-08-10 DIAGNOSIS — Z86010 Personal history of colonic polyps: Secondary | ICD-10-CM | POA: Diagnosis not present

## 2017-08-10 DIAGNOSIS — Z87891 Personal history of nicotine dependence: Secondary | ICD-10-CM | POA: Diagnosis not present

## 2017-08-26 ENCOUNTER — Other Ambulatory Visit: Payer: PPO

## 2017-08-27 ENCOUNTER — Ambulatory Visit: Payer: PPO

## 2017-08-27 ENCOUNTER — Ambulatory Visit: Payer: PPO | Admitting: Hematology and Oncology

## 2017-09-01 ENCOUNTER — Ambulatory Visit
Admission: RE | Admit: 2017-09-01 | Discharge: 2017-09-01 | Disposition: A | Discharge status: Home / Self Care | Payer: MEDICARE

## 2017-09-01 DIAGNOSIS — R5383 Other fatigue: Principal | ICD-10-CM

## 2017-09-01 LAB — HEMOGLOBIN A1C: HEMOGLOBIN A1C: 6.1

## 2017-09-08 ENCOUNTER — Ambulatory Visit: Admit: 2017-09-08 | Discharge: 2017-09-09 | Payer: PRIVATE HEALTH INSURANCE | Attending: Neurology | Primary: Neurology

## 2017-09-08 DIAGNOSIS — G253 Myoclonus: Principal | ICD-10-CM

## 2017-09-08 DIAGNOSIS — R5382 Chronic fatigue, unspecified: Secondary | ICD-10-CM

## 2017-09-08 DIAGNOSIS — Z6830 Body mass index (BMI) 30.0-30.9, adult: Secondary | ICD-10-CM | POA: Diagnosis not present

## 2017-09-08 MED ORDER — CLONAZEPAM 0.5 MG TABLET
ORAL_TABLET | Freq: Every evening | ORAL | 2 refills | 0 days | Status: CP | PRN
Start: 2017-09-08 — End: 2017-12-09

## 2017-09-09 ENCOUNTER — Ambulatory Visit (INDEPENDENT_AMBULATORY_CARE_PROVIDER_SITE_OTHER): Payer: PPO

## 2017-09-09 VITALS — BP 135/85 | HR 69 | Temp 98.0°F | Resp 16 | Ht 66.0 in | Wt 184.3 lb

## 2017-09-09 DIAGNOSIS — Z0000 Encounter for general adult medical examination without abnormal findings: Secondary | ICD-10-CM

## 2017-09-09 NOTE — Progress Notes (Signed)
Subjective:   Leslie Olsen is a 67 y.o. female who presents for Medicare Annual (Subsequent) preventive examination.  Review of Systems:   Cardiac Risk Factors include: hypertension;advanced age (>79men, >43 women);obesity (BMI >30kg/m2);dyslipidemia;diabetes mellitus     Objective:     Vitals: BP 135/85 (BP Location: Left Arm, Patient Position: Sitting)   Pulse 69   Temp 98 F (36.7 C) (Oral)   Resp 16   Ht 5\' 6"  (1.676 m)   Wt 184 lb 4.8 oz (83.6 kg)   BMI 29.75 kg/m   Body mass index is 29.75 kg/m.  Advanced Directives 09/09/2017 03/30/2017 02/26/2017 10/29/2016 09/03/2016 05/26/2016 04/30/2016  Does Patient Have a Medical Advance Directive? Yes No No No No No No  Type of Paramedic of [place];Living will - - - - - -  Copy of [place] in Chart? No - copy requested - - - - - -  Would patient like information on creating a medical advance directive? - - - - - - -    Tobacco Social History   Tobacco Use  Smoking Status Former Smoker  . Packs/day: 2.00  . Years: 45.00  . Pack years: 90.00  . Types: Cigarettes  . Last attempt to quit: 11/06/2011  . Years since quitting: 5.8  Smokeless Tobacco Never Used     Counseling given: Not Answered   Clinical Intake:  Pre-visit preparation completed: Yes  Pain : 0-10 Pain Score: 2  Pain Type: Chronic pain Pain Location: Hip Pain Orientation: Right, Left Pain Descriptors / Indicators: Aching Pain Onset: More than a month ago Pain Frequency: Constant     Nutritional Status: BMI 25 -29 Overweight Nutritional Risks: None Diabetes: Yes CBG done?: No Did pt. bring in CBG monitor from home?: No  How often do you need to have someone help you when you read instructions, pamphlets, or other written materials from your doctor or pharmacy?: 1 - Never What is the last grade level you completed in school?: high school   Interpreter Needed?: No  Information entered by :: Tiffany  Hill,LPN   Past Medical History:  Diagnosis Date  . Anemia   . Chronic headaches   . COPD (chronic obstructive pulmonary disease) ([place])   . Diabetes mellitus without complication ([place])   . GERD (gastroesophageal reflux disease)   . Hypertension   . Hypothyroidism    Past Surgical History:  Procedure Laterality Date  . ABDOMINAL HYSTERECTOMY    . BACK SURGERY    . CHOLECYSTECTOMY    . CORONARY STENT PLACEMENT    . ESOPHAGOGASTRODUODENOSCOPY (EGD) WITH PROPOFOL N/A 05/26/2016   Procedure: ESOPHAGOGASTRODUODENOSCOPY (EGD) WITH PROPOFOL;  Surgeon: Lucilla Lame, MD;  Location: [facility] ENDOSCOPY;  Service: Endoscopy;  Laterality: N/A;  . GIVENS CAPSULE STUDY N/A 05/05/2016   Procedure: GIVENS CAPSULE STUDY;  Surgeon: Lucilla Lame, MD;  Location: [facility] ENDOSCOPY;  Service: Endoscopy;  Laterality: N/A;  . REDUCTION MAMMAPLASTY    . REPLACEMENT TOTAL KNEE    . SHOULDER SURGERY    . TIBIA FRACTURE SURGERY     Family History  Problem Relation Age of Onset  . Lung cancer Maternal Aunt   . Breast cancer Maternal Aunt 57  . Other Mother        Killed in car accident, 26  . Other Father        Killed in car accident, 21  . Thyroid disease Brother   . Thyroid disease Sister    Social History  Socioeconomic History  . Marital status: Divorced    Spouse name: None  . Number of children: None  . Years of education: 86  . Highest education level: 12th grade  Social Needs  . Financial resource strain: Not hard at all  . Food insecurity - worry: Never true  . Food insecurity - inability: Never true  . Transportation needs - medical: No  . Transportation needs - non-medical: No  Occupational History  . None  Tobacco Use  . Smoking status: Former Smoker    Packs/day: 2.00    Years: 45.00    Pack years: 90.00    Types: Cigarettes    Last attempt to quit: 11/06/2011    Years since quitting: 5.8  . Smokeless tobacco: Never Used  Substance and Sexual Activity  . Alcohol use: No  . Drug  use: No  . Sexual activity: No  Other Topics Concern  . None  Social History Narrative   Lives alone.  She has one grown son.   She works as a Retail buyer at [employer]   Highest level of education was high school    Outpatient Encounter Medications as of 09/09/2017  Medication Sig  . aspirin 81 MG tablet Take 81 mg by mouth daily.  . clonazePAM (KLONOPIN) 0.5 MG tablet Take 0.5 mg by mouth at bedtime.   . ferrous fumarate (HEMOCYTE - 106 MG FE) 325 (106 FE) MG TABS tablet Take 1 tablet by mouth daily.  Marland Kitchen glucose blood (COOL BLOOD GLUCOSE TEST STRIPS) test strip Use as instructed  . levETIRAcetam (KEPPRA) 250 MG tablet Take 250 mg by mouth.  . levothyroxine (SYNTHROID, LEVOTHROID) 88 MCG tablet Take 1 tablet (88 mcg total) by mouth daily. Brand name required (Patient taking differently: Take 88 mcg by mouth daily. Brand name required)  . lisinopril (PRINIVIL,ZESTRIL) 5 MG tablet Take 1 tablet (5 mg total) by mouth daily.  . metFORMIN (GLUCOPHAGE) 500 MG tablet Take 2 tablets (1,000 mg total) by mouth daily with breakfast.  . gabapentin (NEURONTIN) 300 MG capsule Take 1 capsule (300 mg total) by mouth 3 (three) times daily. (Patient not taking: Reported on 09/09/2017)  . SUMAtriptan (IMITREX) 100 MG tablet Take 100 mg by mouth every 2 (two) hours as needed for migraine. May repeat in 2 hours if headache persists or recurs.  . [DISCONTINUED] albuterol (PROVENTIL HFA;VENTOLIN HFA) 108 (90 Base) MCG/ACT inhaler Inhale 2 puffs into the lungs every 6 (six) hours as needed for wheezing. (Patient not taking: Reported on 09/09/2017)  . [DISCONTINUED] budesonide-formoterol (SYMBICORT) 160-4.5 MCG/ACT inhaler Inhale 1 puff into the lungs 2 (two) times daily. (Patient not taking: Reported on 09/09/2017)   No facility-administered encounter medications on file as of 09/09/2017.     Activities of Daily Living In your present state of health, do you have any difficulty performing the following activities:  09/09/2017  Hearing? N  Vision? N  Difficulty concentrating or making decisions? N  Walking or climbing stairs? Y  Comment some difficulty due to pain   Dressing or bathing? N  Doing errands, shopping? N  Preparing Food and eating ? N  Using the Toilet? N  In the past six months, have you accidently leaked urine? N  Do you have problems with loss of bowel control? N  Managing your Medications? N  Managing your Finances? N  Housekeeping or managing your Housekeeping? N  Some recent data might be hidden    Patient Care Team: Valerie Roys, DO as PCP -  General (Family Medicine) Lucilla Lame, MD as Consulting Physician (Gastroenterology) Anabel Bene, MD as Referring Physician (Neurology) Lequita Asal, MD as Referring Physician (Hematology and Oncology)    Assessment:   This is a routine wellness examination for [place].  Exercise Activities and Dietary recommendations Current Exercise Habits: The patient does not participate in regular exercise at present, Exercise limited by: orthopedic condition(s)(hip )  Goals    None      Fall Risk Fall Risk  09/09/2017 09/03/2016 02/21/2016  Falls in the past year? No No No   Is the patient's home free of loose throw rugs in walkways, pet beds, electrical cords, etc?   yes      Grab bars in the bathroom? yes      Handrails on the stairs?   yes      Adequate lighting?   yes  Timed Get Up and Go performed: completed in 8 seconds with no use of assistive devices, steady gait. No intervention needed at this time.   Depression Screen PHQ 2/9 Scores 09/09/2017 09/03/2016 02/21/2016  PHQ - 2 Score 0 0 0  PHQ- 9 Score - 7 -     Cognitive Function     6CIT Screen 09/09/2017 09/03/2016  What Year? 0 points 0 points  What month? 0 points 0 points  What time? 0 points 0 points  Count back from 20 0 points 0 points  Months in reverse 0 points 0 points  Repeat phrase 0 points 2 points  Total Score 0 2    Immunization History    Administered Date(s) Administered  . Influenza Split 05/26/2013, 06/10/2014  . Influenza Whole 06/07/2012  . Influenza, High Dose Seasonal PF 06/02/2016  . Influenza, Seasonal, Injecte, Preservative Fre 05/23/2011, 05/30/2012  . Influenza-Unspecified 06/18/2015, 06/14/2017  . Pneumococcal Conjugate-13 09/10/2014  . Pneumococcal Polysaccharide-23 09/08/2007, 02/21/2016  . Zoster 04/27/2011    Qualifies for Shingles Vaccine? Completed zostavax 04/2011, discussed option for shingrix vaccine  Screening Tests Health Maintenance  Topic Date Due  . HEMOGLOBIN A1C  08/18/2017  . FOOT EXAM  09/03/2017  . OPHTHALMOLOGY EXAM  12/03/2017  . MAMMOGRAM  10/05/2018  . COLONOSCOPY  01/29/2020  . TETANUS/TDAP  08/07/2024  . INFLUENZA VACCINE  Completed  . Hepatitis C Screening  Completed  . PNA vac Low Risk Adult  Completed  . DEXA SCAN  Addressed    Cancer Screenings: Lung: Low Dose CT Chest recommended if Age 43-80 years, 30 pack-year currently smoking OR have quit w/in 15years. Patient does not qualify. Already completed within the last year. Breast:  Up to date on Mammogram? Yes   Up to date of Bone Density/Dexa? Yes Colorectal: completed 01/28/2017  Additional Screenings: Hepatitis B/HIV/Syphillis: not indicated Hepatitis C Screening: completed     Plan:    I have personally reviewed and addressed the Medicare Annual Wellness questionnaire and have noted the following in the patient's chart:  A. Medical and social history B. Use of alcohol, tobacco or illicit drugs  C. Current medications and supplements D. Functional ability and status E.  Nutritional status F.  Physical activity G. Advance directives H. List of other physicians I.  Hospitalizations, surgeries, and ER visits in previous 12 months J.  [place] such as hearing and vision if needed, cognitive and depression L. Referrals and appointments   In addition, I have reviewed and discussed with patient  certain preventive protocols, quality metrics, and best practice recommendations. A written personalized care plan for preventive services  as well as general preventive health recommendations were provided to patient.   Signed,  Tyler Aas, LPN Nurse Health Advisor   Nurse Notes: due for A1c check and diabetic foot exam.  Patient received note online from [facility] stating she her TSH was low and could possibly benefit from increase in medication. Per patient no follow up was noted on the letter. Patient informed to make an appointment with Dr.Johnson for medication follow up.

## 2017-09-09 NOTE — Patient Instructions (Signed)
Ms. Leslie Olsen , Thank you for taking time to come for your Medicare Wellness Visit. I appreciate your ongoing commitment to your health goals. Please review the following plan we discussed and let me know if I can assist you in the future.   Screening recommendations/referrals: Colonoscopy: completed 01/28/2017 Mammogram: completed 10/05/2016 Bone Density: completed 06/29/2014 Recommended yearly ophthalmology/optometry visit for glaucoma screening and checkup Recommended yearly dental visit for hygiene and checkup  Vaccinations: Influenza vaccine: up to date Pneumococcal vaccine: up to date Tdap vaccine: up to date Shingles vaccine: up to date    Advanced directives: Please bring a copy of your health care power of attorney and living will to the office at your convenience.  Conditions/risks identified: recommend drinking at least 6-8 glasses of water a day   Next appointment: Follow up with Dr.Johnson within the next month. Follow up in one year for your annual wellness exam.  Preventive Care 65 Years and Older, Female Preventive care refers to lifestyle choices and visits with your health care provider that can promote health and wellness. What does preventive care include?  A yearly physical exam. This is also called an annual well check.  Dental exams once or twice a year.  Routine eye exams. Ask your health care provider how often you should have your eyes checked.  Personal lifestyle choices, including:  Daily care of your teeth and gums.  Regular physical activity.  Eating a healthy diet.  Avoiding tobacco and drug use.  Limiting alcohol use.  Practicing safe sex.  Taking low-dose aspirin every day.  Taking vitamin and mineral supplements as recommended by your health care provider. What happens during an annual well check? The services and screenings done by your health care provider during your annual well check will depend on your age, overall health,  lifestyle risk factors, and family history of disease. Counseling  Your health care provider may ask you questions about your:  Alcohol use.  Tobacco use.  Drug use.  Emotional well-being.  Home and relationship well-being.  Sexual activity.  Eating habits.  History of falls.  Memory and ability to understand (cognition).  Work and work Statistician.  Reproductive health. Screening  You may have the following tests or measurements:  Height, weight, and BMI.  Blood pressure.  Lipid and cholesterol levels. These may be checked every 5 years, or more frequently if you are over 69 years old.  Skin check.  Lung cancer screening. You may have this screening every year starting at age 82 if you have a 30-pack-year history of smoking and currently smoke or have quit within the past 15 years.  Fecal occult blood test (FOBT) of the stool. You may have this test every year starting at age 32.  Flexible sigmoidoscopy or colonoscopy. You may have a sigmoidoscopy every 5 years or a colonoscopy every 10 years starting at age 14.  Hepatitis C blood test.  Hepatitis B blood test.  Sexually transmitted disease (STD) testing.  Diabetes screening. This is done by checking your blood sugar (glucose) after you have not eaten for a while (fasting). You may have this done every 1-3 years.  Bone density scan. This is done to screen for osteoporosis. You may have this done starting at age 22.  Mammogram. This may be done every 1-2 years. Talk to your health care provider about how often you should have regular mammograms. Talk with your health care provider about your test results, treatment options, and if necessary, the need for more  tests. Vaccines  Your health care provider may recommend certain vaccines, such as:  Influenza vaccine. This is recommended every year.  Tetanus, diphtheria, and acellular pertussis (Tdap, Td) vaccine. You may need a Td booster every 10 years.  Zoster  vaccine. You may need this after age 82.  Pneumococcal 13-valent conjugate (PCV13) vaccine. One dose is recommended after age 26.  Pneumococcal polysaccharide (PPSV23) vaccine. One dose is recommended after age 35. Talk to your health care provider about which screenings and vaccines you need and how often you need them. This information is not intended to replace advice given to you by your health care provider. Make sure you discuss any questions you have with your health care provider. Document Released: 09/20/2015 Document Revised: 05/13/2016 Document Reviewed: 06/25/2015 Elsevier Interactive Patient Education  2017 [place] Prevention in the Home Falls can cause injuries. They can happen to people of all ages. There are many things you can do to make your home safe and to help prevent falls. What can I do on the outside of my home?  Regularly fix the edges of walkways and driveways and fix any cracks.  Remove anything that might make you trip as you walk through a door, such as a raised step or threshold.  Trim any bushes or trees on the path to your home.  Use bright outdoor lighting.  Clear any walking paths of anything that might make someone trip, such as rocks or tools.  Regularly check to see if handrails are loose or broken. Make sure that both sides of any steps have handrails.  Any raised decks and porches should have guardrails on the edges.  Have any leaves, snow, or ice cleared regularly.  Use sand or salt on walking paths during winter.  Clean up any spills in your garage right away. This includes oil or grease spills. What can I do in the bathroom?  Use night lights.  Install grab bars by the toilet and in the tub and shower. Do not use towel bars as grab bars.  Use non-skid mats or decals in the tub or shower.  If you need to sit down in the shower, use a plastic, non-slip stool.  Keep the floor dry. Clean up any water that spills on the  floor as soon as it happens.  Remove soap buildup in the tub or shower regularly.  Attach bath mats securely with double-sided non-slip rug tape.  Do not have throw rugs and other things on the floor that can make you trip. What can I do in the bedroom?  Use night lights.  Make sure that you have a light by your bed that is easy to reach.  Do not use any sheets or blankets that are too big for your bed. They should not hang down onto the floor.  Have a firm chair that has side arms. You can use this for support while you get dressed.  Do not have throw rugs and other things on the floor that can make you trip. What can I do in the kitchen?  Clean up any spills right away.  Avoid walking on wet floors.  Keep items that you use a lot in easy-to-reach places.  If you need to reach something above you, use a strong step stool that has a grab bar.  Keep electrical cords out of the way.  Do not use floor polish or wax that makes floors slippery. If you must use wax, use non-skid floor  wax.  Do not have throw rugs and other things on the floor that can make you trip. What can I do with my stairs?  Do not leave any items on the stairs.  Make sure that there are handrails on both sides of the stairs and use them. Fix handrails that are broken or loose. Make sure that handrails are as long as the stairways.  Check any carpeting to make sure that it is firmly attached to the stairs. Fix any carpet that is loose or worn.  Avoid having throw rugs at the top or bottom of the stairs. If you do have throw rugs, attach them to the floor with carpet tape.  Make sure that you have a light switch at the top of the stairs and the bottom of the stairs. If you do not have them, ask someone to add them for you. What else can I do to help prevent falls?  Wear shoes that:  Do not have high heels.  Have rubber bottoms.  Are comfortable and fit you well.  Are closed at the toe. Do not wear  sandals.  If you use a stepladder:  Make sure that it is fully opened. Do not climb a closed stepladder.  Make sure that both sides of the stepladder are locked into place.  Ask someone to hold it for you, if possible.  Clearly mark and make sure that you can see:  Any grab bars or handrails.  First and last steps.  Where the edge of each step is.  Use tools that help you move around (mobility aids) if they are needed. These include:  Canes.  Walkers.  Scooters.  Crutches.  Turn on the lights when you go into a dark area. Replace any light bulbs as soon as they burn out.  Set up your furniture so you have a clear path. Avoid moving your furniture around.  If any of your floors are uneven, fix them.  If there are any pets around you, be aware of where they are.  Review your medicines with your doctor. Some medicines can make you feel dizzy. This can increase your chance of falling. Ask your doctor what other things that you can do to help prevent falls. This information is not intended to replace advice given to you by your health care provider. Make sure you discuss any questions you have with your health care provider. Document Released: 06/20/2009 Document Revised: 01/30/2016 Document Reviewed: 09/28/2014 Elsevier Interactive Patient Education  2017 [employer].

## 2017-09-24 ENCOUNTER — Encounter: Payer: Self-pay | Admitting: Family Medicine

## 2017-09-24 ENCOUNTER — Ambulatory Visit (INDEPENDENT_AMBULATORY_CARE_PROVIDER_SITE_OTHER): Payer: PPO | Admitting: Family Medicine

## 2017-09-24 VITALS — BP 129/82 | HR 56 | Temp 97.8°F | Wt 187.0 lb

## 2017-09-24 DIAGNOSIS — Z7189 Other specified counseling: Secondary | ICD-10-CM

## 2017-09-24 DIAGNOSIS — E119 Type 2 diabetes mellitus without complications: Secondary | ICD-10-CM

## 2017-09-24 DIAGNOSIS — M25551 Pain in right hip: Secondary | ICD-10-CM

## 2017-09-24 DIAGNOSIS — J383 Other diseases of vocal cords: Secondary | ICD-10-CM | POA: Diagnosis not present

## 2017-09-24 DIAGNOSIS — J438 Other emphysema: Secondary | ICD-10-CM | POA: Diagnosis not present

## 2017-09-24 DIAGNOSIS — Z9989 Dependence on other enabling machines and devices: Secondary | ICD-10-CM

## 2017-09-24 DIAGNOSIS — G4733 Obstructive sleep apnea (adult) (pediatric): Secondary | ICD-10-CM | POA: Diagnosis not present

## 2017-09-24 DIAGNOSIS — I10 Essential (primary) hypertension: Secondary | ICD-10-CM

## 2017-09-24 DIAGNOSIS — E039 Hypothyroidism, unspecified: Secondary | ICD-10-CM

## 2017-09-24 DIAGNOSIS — Z66 Do not resuscitate: Secondary | ICD-10-CM | POA: Diagnosis not present

## 2017-09-24 MED ORDER — METFORMIN HCL 500 MG PO TABS: 1000.0000 mg | ORAL_TABLET | Freq: Every day | ORAL | 1 refills | Status: DC

## 2017-09-24 MED ORDER — SUMATRIPTAN SUCCINATE 100 MG PO TABS: 100.0000 mg | ORAL_TABLET | ORAL | 12 refills | Status: DC | PRN

## 2017-09-24 MED ORDER — METFORMIN HCL 500 MG PO TABS
1000.0000 mg | ORAL_TABLET | Freq: Every day | ORAL | 1 refills | Status: DC
Start: 2017-09-24 — End: 2017-12-28

## 2017-09-24 MED ORDER — LISINOPRIL 5 MG PO TABS: 5.0000 mg | ORAL_TABLET | Freq: Every day | ORAL | 1 refills | Status: DC

## 2017-09-24 MED ORDER — SYNTHROID 100 MCG PO TABS: 100.0000 ug | ORAL_TABLET | Freq: Every day | ORAL | 3 refills | Status: DC

## 2017-09-24 NOTE — Progress Notes (Signed)
BP 129/82 (BP Location: Left Arm, Patient Position: Sitting, Cuff Size: Large)   Pulse (!) 56   Temp 97.8 F (36.6 C)   Wt 187 lb (84.8 kg)   SpO2 99%   BMI 30.18 kg/m    Subjective:    Patient ID: Leslie Olsen, female    DOB: April 14, 1951, 67 y.o.   MRN: [id number]  HPI: Leslie Olsen is a 67 y.o. female  Chief Complaint  Patient presents with  . Diabetes  . Hypertension  . Hypothyroidism   Leslie Olsen presents today frustrated. She is feeling better, but is not happy that she has not been able to get an answer to why she doesn't feel well. She has seen several specialists and has had numerous tests. She doesn't think that they positively impact the quality of her life and she doesn't want to do any more. She notes that she has filled out her living will and she doesn't want to have any intubation of resuscitation if her heart stops. She would like to fill out DNR and MOST forms today. She would like to do antibiotics for acute illnesses, but does not want to be on anything for long term or if her quality of life is not preserved. She continues to feel very tired. She does not feel any better. She does not want to see any more specialists. She would like to still do breast cancer screenings. Is on CPAP for her sleep apnea, discussed checking to make sure that her pressures are right- she may consider this in the future, but not now.    Continues with her R hip pain- has not had the x-ray due to some confusion with getting it done last time. Hip still hurting.   HYPERTENSION Hypertension status: controlled  Satisfied with current treatment? yes Duration of hypertension: chronic BP monitoring frequency:  not checking BP range:  BP medication side effects:  no Medication compliance: excellent compliance Previous BP meds: lisinopril Aspirin: yes Recurrent headaches: yes Visual changes: no Palpitations: no Dyspnea: no Chest pain: no Lower extremity edema: no Dizzy/lightheaded:  no  HYPOTHYROIDISM Thyroid control status:stable Satisfied with current treatment? yes Medication side effects: no Medication compliance: excellent compliance Etiology of hypothyroidism:  Recent dose adjustment:no Fatigue: yes Cold intolerance: no Heat intolerance: no Weight gain: no Weight loss: no Constipation: no Diarrhea/loose stools: no Palpitations: no Lower extremity edema: no Anxiety/depressed mood: no  DIABETES Hypoglycemic episodes:no Polydipsia/polyuria: no Visual disturbance: no Chest pain: no Paresthesias: no Glucose Monitoring: no Taking Insulin?: no Blood Pressure Monitoring: not checking Retinal Examination: Not up to Date Foot Exam: Up to Date Diabetic Education: Completed Pneumovax: Up to Date Influenza: Up to Date Aspirin: yes   Relevant past medical, surgical, family and social history reviewed and updated as indicated. Interim medical history since our last visit reviewed. Allergies and medications reviewed and updated.  Review of Systems  Constitutional: Positive for fatigue. Negative for activity change, appetite change, chills, diaphoresis, fever and unexpected weight change.  Respiratory: Negative.   Cardiovascular: Negative.   Musculoskeletal: Negative.   Neurological: Negative.   Psychiatric/Behavioral: Negative.     Per HPI unless specifically indicated above     Objective:    BP 129/82 (BP Location: Left Arm, Patient Position: Sitting, Cuff Size: Large)   Pulse (!) 56   Temp 97.8 F (36.6 C)   Wt 187 lb (84.8 kg)   SpO2 99%   BMI 30.18 kg/m   Wt Readings from Last 3 Encounters:  09/24/17  187 lb (84.8 kg)  09/09/17 184 lb 4.8 oz (83.6 kg)  04/16/17 186 lb 8 oz (84.6 kg)    Physical Exam  Constitutional: She is oriented to person, place, and time. She appears well-developed and well-nourished. No distress.  HENT:  Head: Normocephalic and atraumatic.  Right Ear: Hearing normal.  Left Ear: Hearing normal.  Nose: Nose  normal.  Eyes: Conjunctivae and lids are normal. Right eye exhibits no discharge. Left eye exhibits no discharge. No scleral icterus.  Cardiovascular: Normal rate, regular rhythm, normal heart sounds and intact distal pulses. Exam reveals no gallop and no friction rub.  No murmur heard. Pulmonary/Chest: Effort normal and breath sounds normal. No respiratory distress. She has no wheezes. She has no rales. She exhibits no tenderness.  Musculoskeletal: Normal range of motion.  Neurological: She is alert and oriented to person, place, and time.  Skin: Skin is warm, dry and intact. No rash noted. She is not diaphoretic. No erythema. No pallor.  Psychiatric: She has a normal mood and affect. Her speech is normal and behavior is normal. Judgment and thought content normal. Cognition and memory are normal.  Nursing note and vitals reviewed.   Results for orders placed or performed in visit on 09/24/17  TSH  Result Value Ref Range   TSH 0.297 (L) 0.450 - 4.500 uIU/mL  Hemoglobin A1c  Result Value Ref Range   Hemoglobin A1C 6.1   Basic metabolic panel  Result Value Ref Range   Glucose 103    BUN 17 4 - 21   Creatinine 0.8 0.5 - 1.1   Potassium 4.4 3.4 - 5.3   Sodium 143 137 - 147      Assessment & Plan:   Problem List Items Addressed This Visit      Cardiovascular and Mediastinum   Essential hypertension, benign    Under good control. Continue current regimen. Continue to monitor. Call with any concerns.       Relevant Medications   lisinopril (PRINIVIL,ZESTRIL) 5 MG tablet     Respiratory   COPD (chronic obstructive pulmonary disease) ([place])    Off inhalers as she didn't think that they were helping. She will let us know if she needs anything.       OSA on CPAP    Offered referral to get this rechecked to see if this would help with her fatigue. She would like to hold off on this for now. Will let us know if she changes her mind.        Endocrine   Diabetes mellitus without  complication ([place]) - Primary    Under good control with A1c of 6.1. Continue current regimen. Continue to monitor. Refill given today. Call with any concerns.       Relevant Medications   lisinopril (PRINIVIL,ZESTRIL) 5 MG tablet   metFORMIN (GLUCOPHAGE) 500 MG tablet   Thyroid activity decreased    Saw the endocrinologist. She discussed that she is slightly hyperthyroid right now. Didn't want to change her medicine as it wouldn't necessarily make her feel better. She is not having any hyperthyroid symptoms right now. Continue current regimen. Continue to monitor.       Relevant Medications   SYNTHROID 100 MCG tablet   Other Relevant Orders   TSH (Completed)     Nervous and Auditory   Dysphonia spastica    Stable on current regimen. Continue to monitor. Call with any concerns. Continue to follow with neurology.        Other  DNR (do not resuscitate)    Discussion with patient had today. DNR form filled out and scanned in. Call with any concerns.        Other Visit Diagnoses    Right hip pain       Will obtain x-ray to look for possible arthritis. Order in. Await results.    Counseling regarding advanced directives and goals of care       Discussion today about her goals. DNR and MOST form filled out. Patient given hard copy and we have scanned it into the chart.        Follow up plan: Return 3 months, for follow up.

## 2017-09-25 LAB — TSH: TSH: 0.297 u[IU]/mL — ABNORMAL LOW (ref 0.450–4.500)

## 2017-09-27 ENCOUNTER — Telehealth: Payer: Self-pay | Admitting: Family Medicine

## 2017-09-27 ENCOUNTER — Encounter: Payer: Self-pay | Admitting: Family Medicine

## 2017-09-27 DIAGNOSIS — Z66 Do not resuscitate: Secondary | ICD-10-CM | POA: Insufficient documentation

## 2017-09-27 NOTE — Telephone Encounter (Signed)
Please let Leslie Olsen know that her thyroid is still slightly overtreated, but like we talked about, lowering her dose may make her more tired, and she is not having any side effects from her thyroid right now. We will keep an eye on it but i'm not going to change her dose today. Thanks!

## 2017-09-27 NOTE — Assessment & Plan Note (Signed)
Off inhalers as she didn't think that they were helping. She will let us know if she needs anything.

## 2017-09-27 NOTE — Assessment & Plan Note (Signed)
Discussion with patient had today. DNR form filled out and scanned in. Call with any concerns.

## 2017-09-27 NOTE — Assessment & Plan Note (Signed)
Under good control with A1c of 6.1. Continue current regimen. Continue to monitor. Refill given today. Call with any concerns.

## 2017-09-27 NOTE — Assessment & Plan Note (Signed)
Offered referral to get this rechecked to see if this would help with her fatigue. She would like to hold off on this for now. Will let us know if she changes her mind.

## 2017-09-27 NOTE — Telephone Encounter (Signed)
Patient notified

## 2017-09-27 NOTE — Assessment & Plan Note (Signed)
Under good control. Continue current regimen. Continue to monitor. Call with any concerns. 

## 2017-09-27 NOTE — Assessment & Plan Note (Signed)
Saw the endocrinologist. She discussed that she is slightly hyperthyroid right now. Didn't want to change her medicine as it wouldn't necessarily make her feel better. She is not having any hyperthyroid symptoms right now. Continue current regimen. Continue to monitor.

## 2017-09-27 NOTE — Assessment & Plan Note (Signed)
Stable on current regimen. Continue to monitor. Call with any concerns. Continue to follow with neurology.

## 2017-09-28 ENCOUNTER — Telehealth: Payer: Self-pay | Admitting: Family Medicine

## 2017-09-28 ENCOUNTER — Ambulatory Visit
Admission: RE | Admit: 2017-09-28 | Discharge: 2017-09-28 | Disposition: A | Discharge status: Home / Self Care | Payer: PPO | Source: Ambulatory Visit | Attending: Family Medicine | Admitting: Family Medicine

## 2017-09-28 DIAGNOSIS — M25551 Pain in right hip: Secondary | ICD-10-CM

## 2017-09-28 NOTE — Telephone Encounter (Signed)
Please let her know that her x-ray came back normal. Thanks! 

## 2017-09-29 NOTE — Telephone Encounter (Signed)
Called and left patient a VM(signed DPR) letting her know that her x-ray was normal per Dr. Wynetta Emery.

## 2017-10-15 ENCOUNTER — Encounter: Payer: Self-pay | Admitting: Family Medicine

## 2017-10-15 ENCOUNTER — Ambulatory Visit
Admission: RE | Admit: 2017-10-15 | Discharge: 2017-10-15 | Disposition: A | Discharge status: Home / Self Care | Payer: PPO | Source: Ambulatory Visit | Attending: Family Medicine | Admitting: Family Medicine

## 2017-10-15 DIAGNOSIS — Z1231 Encounter for screening mammogram for malignant neoplasm of breast: Secondary | ICD-10-CM | POA: Insufficient documentation

## 2017-10-15 DIAGNOSIS — Z1239 Encounter for other screening for malignant neoplasm of breast: Secondary | ICD-10-CM

## 2017-11-01 ENCOUNTER — Telehealth: Payer: Self-pay | Admitting: *Deleted

## 2017-11-01 NOTE — Telephone Encounter (Signed)
Left message for patient to notify them that it is time to schedule annual low dose lung cancer screening CT scan. Instructed patient to call back to verify information prior to the scan being scheduled.  

## 2017-11-03 ENCOUNTER — Telehealth: Payer: Self-pay | Admitting: *Deleted

## 2017-11-03 DIAGNOSIS — Z122 Encounter for screening for malignant neoplasm of respiratory organs: Secondary | ICD-10-CM

## 2017-11-03 DIAGNOSIS — Z87891 Personal history of nicotine dependence: Secondary | ICD-10-CM

## 2017-11-03 NOTE — Telephone Encounter (Signed)
Notified patient that annual lung cancer screening low dose CT scan is due currently or will be in near future. Confirmed that patient is within the age range of 55-77, and asymptomatic, (no signs or symptoms of lung cancer). Patient denies illness that would prevent curative treatment for lung cancer if found. Verified smoking history, (former, quit 3/13. 90 pack year). The shared decision making visit was done 11/03/16. Patient is agreeable for CT scan being scheduled.

## 2017-11-04 ENCOUNTER — Other Ambulatory Visit: Payer: Self-pay | Admitting: Family Medicine

## 2017-11-04 DIAGNOSIS — E119 Type 2 diabetes mellitus without complications: Secondary | ICD-10-CM

## 2017-11-10 ENCOUNTER — Ambulatory Visit
Admission: RE | Admit: 2017-11-10 | Discharge: 2017-11-10 | Disposition: A | Discharge status: Home / Self Care | Payer: PPO | Source: Ambulatory Visit | Attending: Nurse Practitioner | Admitting: Nurse Practitioner

## 2017-11-10 DIAGNOSIS — Z122 Encounter for screening for malignant neoplasm of respiratory organs: Secondary | ICD-10-CM | POA: Insufficient documentation

## 2017-11-10 DIAGNOSIS — Z87891 Personal history of nicotine dependence: Secondary | ICD-10-CM | POA: Diagnosis not present

## 2017-11-10 DIAGNOSIS — J439 Emphysema, unspecified: Secondary | ICD-10-CM | POA: Diagnosis not present

## 2017-11-10 DIAGNOSIS — I2510 Atherosclerotic heart disease of native coronary artery without angina pectoris: Secondary | ICD-10-CM | POA: Diagnosis not present

## 2017-11-10 DIAGNOSIS — I700 Atherosclerosis of aorta: Secondary | ICD-10-CM | POA: Insufficient documentation

## 2017-11-17 ENCOUNTER — Telehealth: Payer: Self-pay | Admitting: *Deleted

## 2017-11-17 NOTE — Telephone Encounter (Signed)
Notified patient of LDCT lung cancer screening program results with recommendation for 6 month follow up imaging. Also notified of incidental findings noted below and is encouraged to discuss further with PCP who will receive a copy of this note and/or the CT report. Patient verbalizes understanding.   IMPRESSION: 1. Lung-RADS 3, probably benign findings. Short-term follow-up in 6 months is recommended with repeat low-dose chest CT without contrast (please use the following order, "CT CHEST LCS NODULE FOLLOW-UP W/O CM"). 2. Aortic Atherosclerosis (ICD10-I70.0) and Emphysema (ICD10-J43.9). 3. Atherosclerotic calcifications noted within the LAD and RCA coronary arteries.

## 2017-11-26 ENCOUNTER — Encounter: Payer: Self-pay | Admitting: Family Medicine

## 2017-11-26 ENCOUNTER — Ambulatory Visit (INDEPENDENT_AMBULATORY_CARE_PROVIDER_SITE_OTHER): Payer: PPO | Admitting: Family Medicine

## 2017-11-26 VITALS — BP 133/81 | HR 68 | Temp 97.5°F | Wt 189.4 lb

## 2017-11-26 DIAGNOSIS — R300 Dysuria: Secondary | ICD-10-CM | POA: Diagnosis not present

## 2017-11-26 DIAGNOSIS — N3001 Acute cystitis with hematuria: Secondary | ICD-10-CM | POA: Diagnosis not present

## 2017-11-26 MED ORDER — PHENAZOPYRIDINE HCL 100 MG PO TABS: 100.0000 mg | ORAL_TABLET | Freq: Three times a day (TID) | ORAL | 0 refills | Status: DC | PRN

## 2017-11-26 MED ORDER — NITROFURANTOIN MONOHYD MACRO 100 MG PO CAPS: 100.0000 mg | ORAL_CAPSULE | Freq: Two times a day (BID) | ORAL | 0 refills | Status: DC

## 2017-11-26 NOTE — Progress Notes (Signed)
BP 133/81 (BP Location: Left Arm, Patient Position: Sitting, Cuff Size: Normal)   Pulse 68   Temp (!) 97.5 F (36.4 C)   Wt 189 lb 7 oz (85.9 kg)   SpO2 100%   BMI 30.58 kg/m    Subjective:    Patient ID: Leslie Olsen, female    DOB: [birth year], 67 y.o.   MRN: [id number]  HPI: Leslie Olsen is a 67 y.o. female  Chief Complaint  Patient presents with  . Dysuria    X 1 week, getting worse   URINARY SYMPTOMS Duration: 1+ week Dysuria: burning Urinary frequency: yes Urgency: yes Small volume voids: yes Symptom severity: moderate Urinary incontinence: yes Foul odor: yes Hematuria: no Abdominal pain: No changes Back pain: No changes Suprapubic pain/pressure: yes Flank pain: no Fever:  no Vomiting: no Relief with cranberry juice: no Relief with pyridium: no Status: worse Previous urinary tract infection: yes Recurrent urinary tract infection: no Sexual activity: No sexually active History of sexually transmitted disease: no Vaginal discharge: no Treatments attempted: increasing fluids   Relevant past medical, surgical, family and social history reviewed and updated as indicated. Interim medical history since our last visit reviewed. Allergies and medications reviewed and updated.  Review of Systems  Constitutional: Negative.   Respiratory: Negative.   Cardiovascular: Negative.   Gastrointestinal: Negative.   Genitourinary: Positive for dysuria, frequency and urgency. Negative for decreased urine volume, difficulty urinating, dyspareunia, enuresis, flank pain, genital sores, hematuria, menstrual problem, pelvic pain, vaginal bleeding, vaginal discharge and vaginal pain.  Musculoskeletal: Negative.   Psychiatric/Behavioral: Negative.     Per HPI unless specifically indicated above     Objective:    BP 133/81 (BP Location: Left Arm, Patient Position: Sitting, Cuff Size: Normal)   Pulse 68   Temp (!) 97.5 F (36.4 C)   Wt 189 lb 7 oz (85.9 kg)   SpO2  100%   BMI 30.58 kg/m   Wt Readings from Last 3 Encounters:  11/26/17 189 lb 7 oz (85.9 kg)  11/10/17 180 lb (81.6 kg)  09/24/17 187 lb (84.8 kg)    Physical Exam  Constitutional: She is oriented to person, place, and time. She appears well-developed and well-nourished. No distress.  HENT:  Head: Normocephalic and atraumatic.  Right Ear: Hearing normal.  Left Ear: Hearing normal.  Nose: Nose normal.  Eyes: Conjunctivae and lids are normal. Right eye exhibits no discharge. Left eye exhibits no discharge. No scleral icterus.  Cardiovascular: Normal rate, regular rhythm, normal heart sounds and intact distal pulses. Exam reveals no gallop and no friction rub.  No murmur heard. Pulmonary/Chest: Effort normal and breath sounds normal. No respiratory distress. She has no wheezes. She has no rales. She exhibits no tenderness.  Abdominal: Soft. Bowel sounds are normal. She exhibits no distension and no mass. There is no tenderness. There is no rebound and no guarding.  Musculoskeletal: Normal range of motion.  Neurological: She is alert and oriented to person, place, and time.  Skin: Skin is warm, dry and intact. No rash noted. She is not diaphoretic. No erythema. No pallor.  Psychiatric: She has a normal mood and affect. Her speech is normal and behavior is normal. Judgment and thought content normal. Cognition and memory are normal.  Nursing note and vitals reviewed.   Results for orders placed or performed in visit on 09/24/17  TSH  Result Value Ref Range   TSH 0.297 (L) 0.450 - 4.500 uIU/mL  Hemoglobin A1c  Result Value Ref  Range   Hemoglobin A1C 6.1   Basic metabolic panel  Result Value Ref Range   Glucose 103    BUN 17 4 - 21   Creatinine 0.8 0.5 - 1.1   Potassium 4.4 3.4 - 5.3   Sodium 143 137 - 147      Assessment & Plan:   Problem List Items Addressed This Visit    None    Visit Diagnoses    Acute cystitis with hematuria    -  Primary   Will treat with  nitrofurantoin. Call with any concerns. Continue to monitor.    Dysuria       +leuks, nitrated and blood- will treat   Relevant Orders   UA/M w/rflx Culture, Routine       Follow up plan: Return in about 1 month (around 12/24/2017) for diabetes follow up.

## 2017-11-30 LAB — UA/M W/RFLX CULTURE, ROUTINE
Bilirubin, UA: NEGATIVE
GLUCOSE, UA: NEGATIVE
Ketones, UA: NEGATIVE
NITRITE UA: POSITIVE — AB
SPEC GRAV UA: 1.005 (ref 1.005–1.030)
Urobilinogen, Ur: 0.2 mg/dL (ref 0.2–1.0)
pH, UA: 5.5 (ref 5.0–7.5)

## 2017-11-30 LAB — MICROSCOPIC EXAMINATION

## 2017-11-30 LAB — URINE CULTURE, REFLEX

## 2017-12-10 MED ORDER — CLONAZEPAM 0.5 MG TABLET
ORAL_TABLET | 2 refills | 0 days | Status: CP
Start: 2017-12-10 — End: 2018-06-27

## 2017-12-24 ENCOUNTER — Ambulatory Visit: Payer: PPO | Admitting: Family Medicine

## 2017-12-28 ENCOUNTER — Ambulatory Visit (INDEPENDENT_AMBULATORY_CARE_PROVIDER_SITE_OTHER): Payer: PPO | Admitting: Family Medicine

## 2017-12-28 ENCOUNTER — Encounter: Payer: Self-pay | Admitting: Family Medicine

## 2017-12-28 VITALS — BP 148/73 | HR 80 | Temp 98.3°F | Wt 189.3 lb

## 2017-12-28 DIAGNOSIS — E782 Mixed hyperlipidemia: Secondary | ICD-10-CM | POA: Diagnosis not present

## 2017-12-28 DIAGNOSIS — D509 Iron deficiency anemia, unspecified: Secondary | ICD-10-CM

## 2017-12-28 DIAGNOSIS — I10 Essential (primary) hypertension: Secondary | ICD-10-CM

## 2017-12-28 DIAGNOSIS — E119 Type 2 diabetes mellitus without complications: Secondary | ICD-10-CM

## 2017-12-28 DIAGNOSIS — J438 Other emphysema: Secondary | ICD-10-CM

## 2017-12-28 DIAGNOSIS — E039 Hypothyroidism, unspecified: Secondary | ICD-10-CM | POA: Diagnosis not present

## 2017-12-28 DIAGNOSIS — J383 Other diseases of vocal cords: Secondary | ICD-10-CM | POA: Diagnosis not present

## 2017-12-28 LAB — MICROALBUMIN, URINE WAIVED
Creatinine, Urine Waived: 100 mg/dL (ref 10–300)
Microalb, Ur Waived: 30 mg/L — ABNORMAL HIGH (ref 0–19)
Microalb/Creat Ratio: <30 mg/g (ref <30)

## 2017-12-28 LAB — BAYER DCA HB A1C WAIVED: HB A1C (BAYER DCA - WAIVED): 5.8 % (ref <7.0)

## 2017-12-28 MED ORDER — METFORMIN HCL 500 MG PO TABS: 500.0000 mg | ORAL_TABLET | Freq: Every day | ORAL | 1 refills | Status: DC

## 2017-12-28 MED ORDER — PREDNISONE 50 MG PO TABS: 50.0000 mg | ORAL_TABLET | Freq: Every day | ORAL | 0 refills | Status: DC

## 2017-12-28 NOTE — Progress Notes (Signed)
BP (!) 148/73 (BP Location: Left Arm, Patient Position: Sitting, Cuff Size: Normal)   Pulse 80   Temp 98.3 F (36.8 C)   Wt 189 lb 5 oz (85.9 kg)   SpO2 97%   BMI 30.56 kg/m    Subjective:    Patient ID: Leslie Olsen, female    DOB: [birth year], 67 y.o.   MRN: [id number]  HPI: Leslie Olsen is a 67 y.o. female  Chief Complaint  Patient presents with  . Diabetes   Having a bad day with her dystonia today. Coming in spells, not sure how often it's happening. Has been following with neurology. Very frustrated today.  DIABETES Hypoglycemic episodes:no Polydipsia/polyuria: no Visual disturbance: no Chest pain: no Paresthesias: no Glucose Monitoring: yes  Accucheck frequency: Daily Taking Insulin?: no Blood Pressure Monitoring: not checking Retinal Examination: Not up to Date Foot Exam: Up to Date Diabetic Education: Completed Pneumovax: Up to Date Influenza: Up to Date Aspirin: yes  HYPERTENSION / HYPERLIPIDEMIA Satisfied with current treatment? yes Duration of hypertension: chronic BP monitoring frequency: not checking BP medication side effects: no Past BP meds: lisinopril Duration of hyperlipidemia: chronic Cholesterol medication side effects: Not on anything Cholesterol supplements: none Medication compliance: excellent compliance Aspirin: yes Recent stressors: yes Recurrent headaches: no Visual changes: no Palpitations: no Dyspnea: no Chest pain: no Lower extremity edema: no Dizzy/lightheaded: no  ANEMIA Anemia status: stable Etiology of anemia: iron deficiency Duration of anemia treatment: chronic Compliance with treatment: excellent compliance Iron supplementation side effects: yes- has been on IV iron Severity of anemia: severe Fatigue: yes Decreased exercise tolerance: yes  Dyspnea on exertion: yes Palpitations: no Bleeding: no Pica: no  Relevant past medical, surgical, family and social history reviewed and updated as indicated.  Interim medical history since our last visit reviewed. Allergies and medications reviewed and updated.  Review of Systems  Constitutional: Negative.   Respiratory: Positive for cough, chest tightness and shortness of breath. Negative for apnea, choking, wheezing and stridor.   Cardiovascular: Negative.   Gastrointestinal: Negative.   Musculoskeletal: Negative.   Neurological: Positive for speech difficulty. Negative for dizziness, tremors, seizures, syncope, facial asymmetry, weakness, light-headedness, numbness and headaches.  Psychiatric/Behavioral: Negative.     Per HPI unless specifically indicated above     Objective:    BP (!) 148/73 (BP Location: Left Arm, Patient Position: Sitting, Cuff Size: Normal)   Pulse 80   Temp 98.3 F (36.8 C)   Wt 189 lb 5 oz (85.9 kg)   SpO2 97%   BMI 30.56 kg/m   Wt Readings from Last 3 Encounters:  12/28/17 189 lb 5 oz (85.9 kg)  11/26/17 189 lb 7 oz (85.9 kg)  11/10/17 180 lb (81.6 kg)    Physical Exam  Constitutional: She is oriented to person, place, and time. She appears well-developed and well-nourished. No distress.  HENT:  Head: Normocephalic and atraumatic.  Right Ear: Hearing and external ear normal.  Left Ear: Hearing and external ear normal.  Nose: Nose normal.  Mouth/Throat: Oropharynx is clear and moist. No oropharyngeal exudate.  Eyes: Conjunctivae and lids are normal. Right eye exhibits no discharge. Left eye exhibits no discharge. No scleral icterus.  Cardiovascular: Normal rate, regular rhythm, normal heart sounds and intact distal pulses. Exam reveals no gallop and no friction rub.  No murmur heard. Pulmonary/Chest: Effort normal and breath sounds normal. No stridor. No respiratory distress. She has no wheezes. She has no rales. She exhibits no tenderness.  Musculoskeletal: Normal range of  motion.  Neurological: She is alert and oriented to person, place, and time.  Skin: Skin is warm, dry and intact. Capillary refill  takes less than 2 seconds. No rash noted. She is not diaphoretic. No erythema. No pallor.  Psychiatric: She has a normal mood and affect. Her speech is normal and behavior is normal. Judgment and thought content normal. Cognition and memory are normal.  Nursing note and vitals reviewed.   Results for orders placed or performed in visit on 11/26/17  Microscopic Examination  Result Value Ref Range   WBC, UA >30 (A) 0 - 5 /hpf   RBC, UA 3-10 (A) 0 - 2 /hpf   Epithelial Cells (non renal) 0-10 0 - 10 /hpf   Renal Epithel, UA 0-10 None seen /hpf   Bacteria, UA Many (A) None seen/Few  Urine Culture, Reflex  Result Value Ref Range   Urine Culture, Routine Final report (A)    Organism ID, Bacteria Comment (A)    Antimicrobial Susceptibility Comment   UA/M w/rflx Culture, Routine  Result Value Ref Range   Specific Gravity, UA 1.005 1.005 - 1.030   pH, UA 5.5 5.0 - 7.5   Color, UA Yellow Yellow   Appearance Ur Slightly cloudy Clear   Leukocytes, UA 3+ (A) Negative   Protein, UA Trace Negative/Trace   Glucose, UA Negative Negative   Ketones, UA Negative Negative   RBC, UA 2+ (A) Negative   Bilirubin, UA Negative Negative   Urobilinogen, Ur 0.2 0.2 - 1.0 mg/dL   Nitrite, UA Positive (A) Negative   Microscopic Examination See below:    Urinalysis Reflex Comment       Assessment & Plan:   Problem List Items Addressed This Visit      Cardiovascular and Mediastinum   Essential hypertension, benign    Slightly worsened today due to dysphonia exacerbation. Call with any concerns. Continue current regimen.       Relevant Orders   CBC with Differential/Platelet   Comprehensive metabolic panel   Microalbumin, Urine Waived     Respiratory   COPD (chronic obstructive pulmonary disease) ([facility])    Lungs clear today, Rx for prednisone burst given if coughing worsens. Call with any concerns.       Relevant Medications   predniSONE (DELTASONE) 50 MG tablet     Endocrine   Diabetes  mellitus without complication ([place]) - Primary    Under good control with A1c of 5.8- will reduce her metformin to 500mg  daily and recheck in 6 months. Call with any concerns.       Relevant Medications   metFORMIN (GLUCOPHAGE) 500 MG tablet   Other Relevant Orders   CBC with Differential/Platelet   Bayer DCA Hb A1c Waived   Comprehensive metabolic panel   Microalbumin, Urine Waived     Nervous and Auditory   Dysphonia spastica    In exacerbation today. Will monitor how often happening and will call neurology if in exacerbation more than 2 weeks a month.         Other   Anemia    Rechecking levels today. Continue to follow with hematology. Call with any concerns.       Relevant Orders   CBC with Differential/Platelet   Comprehensive metabolic panel   Hyperlipidemia    Rechecking levels today. Continue to monitor. Call with any concerns.       Relevant Orders   CBC with Differential/Platelet   Comprehensive metabolic panel   Lipid Panel w/o Chol/HDL Ratio  Follow up plan: Return in about 6 months (around 06/29/2018) for [facility] .

## 2017-12-28 NOTE — Assessment & Plan Note (Signed)
Slightly worsened today due to dysphonia exacerbation. Call with any concerns. Continue current regimen.

## 2017-12-28 NOTE — Assessment & Plan Note (Signed)
Rechecking levels today. Continue to monitor. Call with any concerns.  

## 2017-12-28 NOTE — Addendum Note (Signed)
Addended by: Valerie Roys on: 12/28/2017 01:02 PM   Modules accepted: Orders

## 2017-12-28 NOTE — Assessment & Plan Note (Signed)
Under good control with A1c of 5.8- will reduce her metformin to 500mg  daily and recheck in 6 months. Call with any concerns.

## 2017-12-28 NOTE — Assessment & Plan Note (Signed)
Rechecking levels today. Continue to follow with hematology. Call with any concerns.  

## 2017-12-28 NOTE — Assessment & Plan Note (Signed)
In exacerbation today. Will monitor how often happening and will call neurology if in exacerbation more than 2 weeks a month.

## 2017-12-28 NOTE — Assessment & Plan Note (Signed)
Lungs clear today, Rx for prednisone burst given if coughing worsens. Call with any concerns.

## 2017-12-29 ENCOUNTER — Encounter: Payer: Self-pay | Admitting: Family Medicine

## 2017-12-29 LAB — COMPREHENSIVE METABOLIC PANEL
ALK PHOS: 93 IU/L (ref 39–117)
ALT: 13 IU/L (ref 0–32)
AST: 20 IU/L (ref 0–40)
Albumin/Globulin Ratio: 1.4 (ref 1.2–2.2)
Albumin: 4.6 g/dL (ref 3.6–4.8)
BILIRUBIN TOTAL: 0.3 mg/dL (ref 0.0–1.2)
BUN/Creatinine Ratio: 19 (ref 12–28)
BUN: 19 mg/dL (ref 8–27)
CHLORIDE: 99 mmol/L (ref 96–106)
CO2: 24 mmol/L (ref 20–29)
Calcium: 10 mg/dL (ref 8.7–10.3)
Creatinine, Ser: 0.98 mg/dL (ref 0.57–1.00)
GFR calc Af Amer: 69 mL/min/{1.73_m2} (ref >59)
GFR calc non Af Amer: 59 mL/min/{1.73_m2} — ABNORMAL LOW (ref >59)
GLUCOSE: 115 mg/dL — AB (ref 65–99)
Globulin, Total: 3.2 g/dL (ref 1.5–4.5)
Potassium: 4.8 mmol/L (ref 3.5–5.2)
Sodium: 135 mmol/L (ref 134–144)
Total Protein: 7.8 g/dL (ref 6.0–8.5)

## 2017-12-29 LAB — THYROID PANEL WITH TSH
Free Thyroxine Index: 2.2 (ref 1.2–4.9)
T3 UPTAKE RATIO: 26 % (ref 24–39)
T4, Total: 8.4 ug/dL (ref 4.5–12.0)
TSH: 0.793 u[IU]/mL (ref 0.450–4.500)

## 2017-12-29 LAB — CBC WITH DIFFERENTIAL/PLATELET
BASOS ABS: 0.1 10*3/uL (ref 0.0–0.2)
Basos: 1 %
EOS (ABSOLUTE): 0.6 10*3/uL — AB (ref 0.0–0.4)
Eos: 11 %
Hematocrit: 40.9 % (ref 34.0–46.6)
Hemoglobin: 13.3 g/dL (ref 11.1–15.9)
IMMATURE GRANS (ABS): 0 10*3/uL (ref 0.0–0.1)
Immature Granulocytes: 0 %
LYMPHS ABS: 1.9 10*3/uL (ref 0.7–3.1)
LYMPHS: 33 %
MCH: 31 pg (ref 26.6–33.0)
MCHC: 32.5 g/dL (ref 31.5–35.7)
MCV: 95 fL (ref 79–97)
Monocytes Absolute: 0.4 10*3/uL (ref 0.1–0.9)
Monocytes: 8 %
NEUTROS ABS: 2.6 10*3/uL (ref 1.4–7.0)
Neutrophils: 47 %
PLATELETS: 291 10*3/uL (ref 150–379)
RBC: 4.29 x10E6/uL (ref 3.77–5.28)
RDW: 13.6 % (ref 12.3–15.4)
WBC: 5.6 10*3/uL (ref 3.4–10.8)

## 2017-12-29 LAB — LIPID PANEL W/O CHOL/HDL RATIO
CHOLESTEROL TOTAL: 265 mg/dL — AB (ref 100–199)
HDL: 47 mg/dL (ref >39)
LDL Calculated: 178 mg/dL — ABNORMAL HIGH (ref 0–99)
Triglycerides: 198 mg/dL — ABNORMAL HIGH (ref 0–149)
VLDL Cholesterol Cal: 40 mg/dL (ref 5–40)

## 2018-02-13 MED ORDER — LEVETIRACETAM 500 MG TABLET
ORAL_TABLET | 1 refills | 0 days | Status: CP
Start: 2018-02-13 — End: 2018-03-03

## 2018-03-03 ENCOUNTER — Ambulatory Visit
Admit: 2018-03-03 | Discharge: 2018-03-04 | Payer: PRIVATE HEALTH INSURANCE | Attending: Student in an Organized Health Care Education/Training Program | Primary: Student in an Organized Health Care Education/Training Program

## 2018-03-03 DIAGNOSIS — R109 Unspecified abdominal pain: Secondary | ICD-10-CM

## 2018-03-03 DIAGNOSIS — K580 Irritable bowel syndrome with diarrhea: Secondary | ICD-10-CM

## 2018-03-03 DIAGNOSIS — R634 Abnormal weight loss: Secondary | ICD-10-CM

## 2018-03-03 DIAGNOSIS — R768 Other specified abnormal immunological findings in serum: Secondary | ICD-10-CM

## 2018-03-03 DIAGNOSIS — D649 Anemia, unspecified: Secondary | ICD-10-CM

## 2018-03-03 DIAGNOSIS — R197 Diarrhea, unspecified: Principal | ICD-10-CM

## 2018-03-03 MED ORDER — CHOLESTYRAMINE (WITH SUGAR) 4 GRAM POWDER FOR SUSP IN A PACKET
Freq: Every day | ORAL | 12 refills | 0.00000 days | Status: CP
Start: 2018-03-03 — End: 2019-03-03

## 2018-03-06 DIAGNOSIS — N390 Urinary tract infection, site not specified: Secondary | ICD-10-CM | POA: Diagnosis not present

## 2018-03-06 DIAGNOSIS — R399 Unspecified symptoms and signs involving the genitourinary system: Secondary | ICD-10-CM | POA: Diagnosis not present

## 2018-04-02 ENCOUNTER — Other Ambulatory Visit: Payer: Self-pay | Admitting: Family Medicine

## 2018-04-04 ENCOUNTER — Ambulatory Visit
Admit: 2018-04-04 | Discharge: 2018-04-05 | Payer: PRIVATE HEALTH INSURANCE | Attending: Internal Medicine | Primary: Internal Medicine

## 2018-04-04 DIAGNOSIS — R768 Other specified abnormal immunological findings in serum: Principal | ICD-10-CM

## 2018-04-04 DIAGNOSIS — Z6831 Body mass index (BMI) 31.0-31.9, adult: Secondary | ICD-10-CM | POA: Diagnosis not present

## 2018-04-18 ENCOUNTER — Ambulatory Visit: Admit: 2018-04-18 | Discharge: 2018-04-19 | Payer: PRIVATE HEALTH INSURANCE | Attending: Neurology | Primary: Neurology

## 2018-04-18 DIAGNOSIS — R5382 Chronic fatigue, unspecified: Principal | ICD-10-CM

## 2018-04-18 DIAGNOSIS — R499 Unspecified voice and resonance disorder: Secondary | ICD-10-CM

## 2018-04-18 DIAGNOSIS — R066 Hiccough: Secondary | ICD-10-CM

## 2018-04-18 DIAGNOSIS — G253 Myoclonus: Secondary | ICD-10-CM

## 2018-04-18 DIAGNOSIS — Z6832 Body mass index (BMI) 32.0-32.9, adult: Secondary | ICD-10-CM | POA: Diagnosis not present

## 2018-04-18 MED ORDER — METHYLPHENIDATE 5 MG TABLET
ORAL_TABLET | Freq: Every day | ORAL | 0 refills | 0 days | Status: CP
Start: 2018-04-18 — End: 2018-05-26

## 2018-04-18 MED ORDER — CLONAZEPAM 0.5 MG TABLET
ORAL_TABLET | Freq: Every evening | ORAL | 0 refills | 0 days | Status: CP | PRN
Start: 2018-04-18 — End: 2018-06-15

## 2018-04-18 MED ORDER — LEVETIRACETAM 500 MG TABLET
ORAL_TABLET | Freq: Two times a day (BID) | ORAL | 3 refills | 0 days | Status: CP
Start: 2018-04-18 — End: 2019-04-18

## 2018-04-29 ENCOUNTER — Ambulatory Visit: Admit: 2018-04-29 | Discharge: 2018-04-29 | Payer: PRIVATE HEALTH INSURANCE

## 2018-04-29 ENCOUNTER — Encounter
Admit: 2018-04-29 | Discharge: 2018-04-29 | Payer: PRIVATE HEALTH INSURANCE | Attending: Anesthesiology | Primary: Anesthesiology

## 2018-04-29 DIAGNOSIS — R1011 Right upper quadrant pain: Principal | ICD-10-CM

## 2018-04-29 DIAGNOSIS — I10 Essential (primary) hypertension: Secondary | ICD-10-CM | POA: Diagnosis not present

## 2018-04-29 DIAGNOSIS — Z955 Presence of coronary angioplasty implant and graft: Secondary | ICD-10-CM | POA: Diagnosis not present

## 2018-04-29 DIAGNOSIS — G4733 Obstructive sleep apnea (adult) (pediatric): Secondary | ICD-10-CM | POA: Diagnosis not present

## 2018-04-29 DIAGNOSIS — M5481 Occipital neuralgia: Secondary | ICD-10-CM | POA: Diagnosis not present

## 2018-04-29 DIAGNOSIS — E119 Type 2 diabetes mellitus without complications: Secondary | ICD-10-CM | POA: Diagnosis not present

## 2018-04-29 DIAGNOSIS — K3189 Other diseases of stomach and duodenum: Secondary | ICD-10-CM | POA: Diagnosis not present

## 2018-04-29 DIAGNOSIS — K449 Diaphragmatic hernia without obstruction or gangrene: Secondary | ICD-10-CM | POA: Diagnosis not present

## 2018-04-29 DIAGNOSIS — R51 Headache: Secondary | ICD-10-CM | POA: Diagnosis not present

## 2018-04-29 DIAGNOSIS — E039 Hypothyroidism, unspecified: Secondary | ICD-10-CM | POA: Diagnosis not present

## 2018-04-29 DIAGNOSIS — J449 Chronic obstructive pulmonary disease, unspecified: Secondary | ICD-10-CM | POA: Diagnosis not present

## 2018-04-29 DIAGNOSIS — E785 Hyperlipidemia, unspecified: Secondary | ICD-10-CM | POA: Diagnosis not present

## 2018-04-29 DIAGNOSIS — I2510 Atherosclerotic heart disease of native coronary artery without angina pectoris: Secondary | ICD-10-CM | POA: Diagnosis not present

## 2018-04-29 IMAGING — US US BREAST*L* LIMITED INC AXILLA
1 series · 3 of 3 positions shown · non-contrast
Comparison: Previous exam(s).

CLINICAL DATA: 65-year-old female presenting for evaluation of a
palpable area of thickening in the superior left breast. The patient
has felt this for about 3 months.

EXAM:
2D DIGITAL DIAGNOSTIC BILATERAL MAMMOGRAM WITH CAD AND ADJUNCT TOMO
LEFT BREAST ULTRASOUND

[Series 1: us breast*left* limited inc axilla · 0.05mm/px · 3 of 3 slices shown]
[im 1/3]
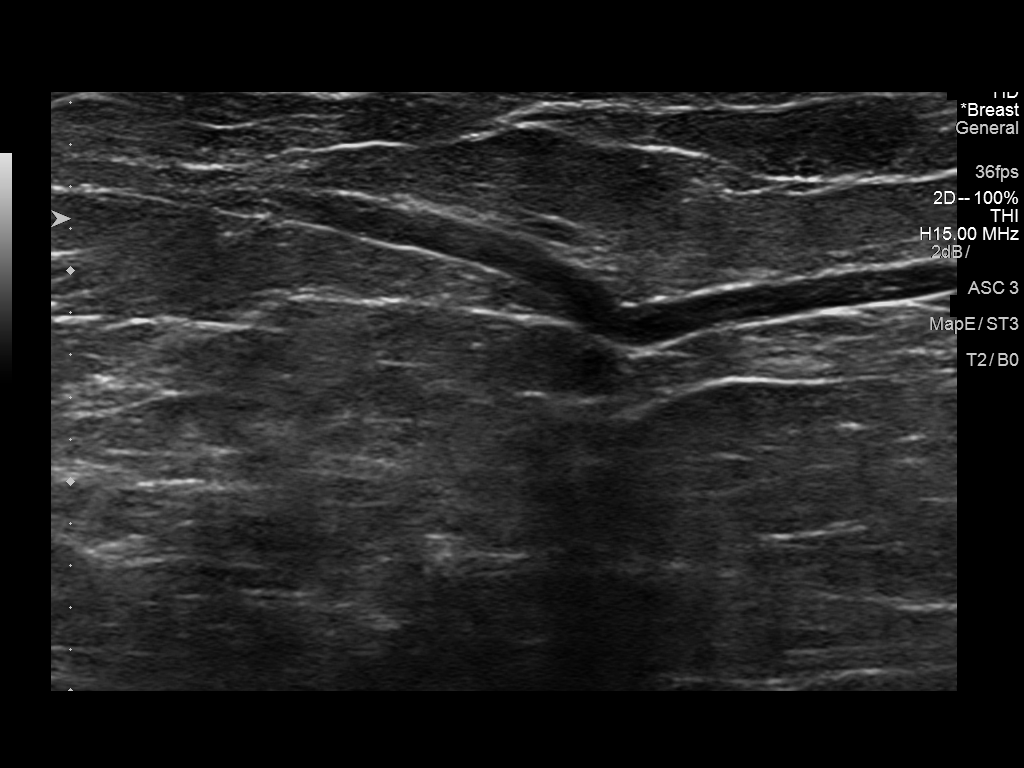
[im 2/3]
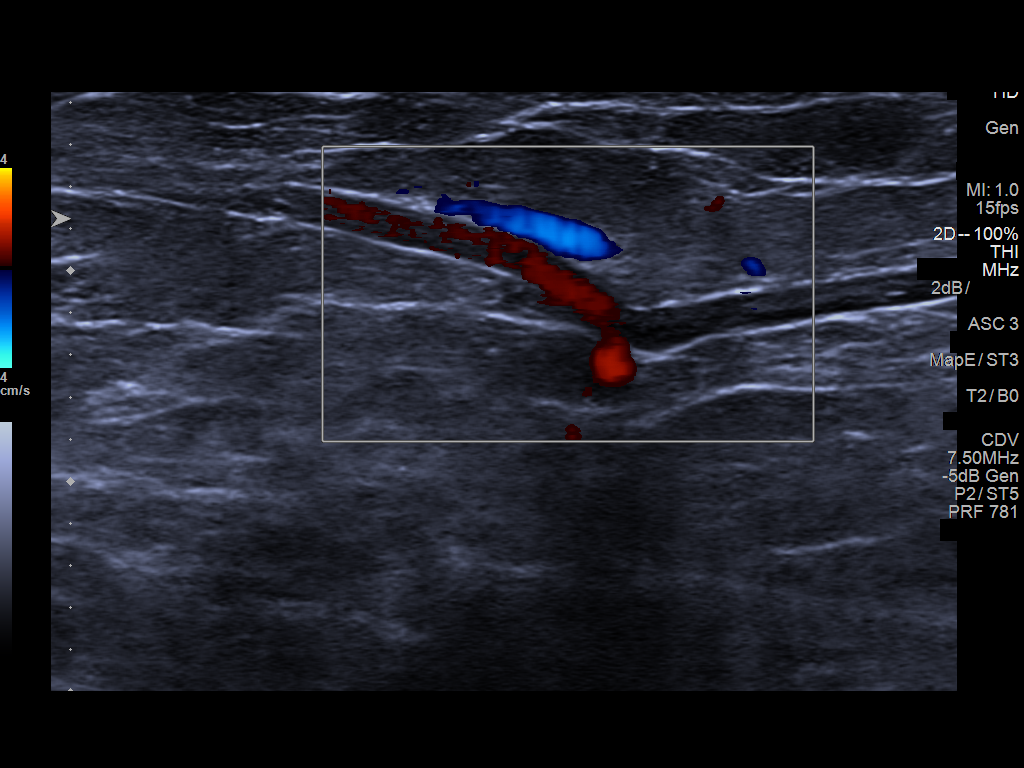
[im 3/3]
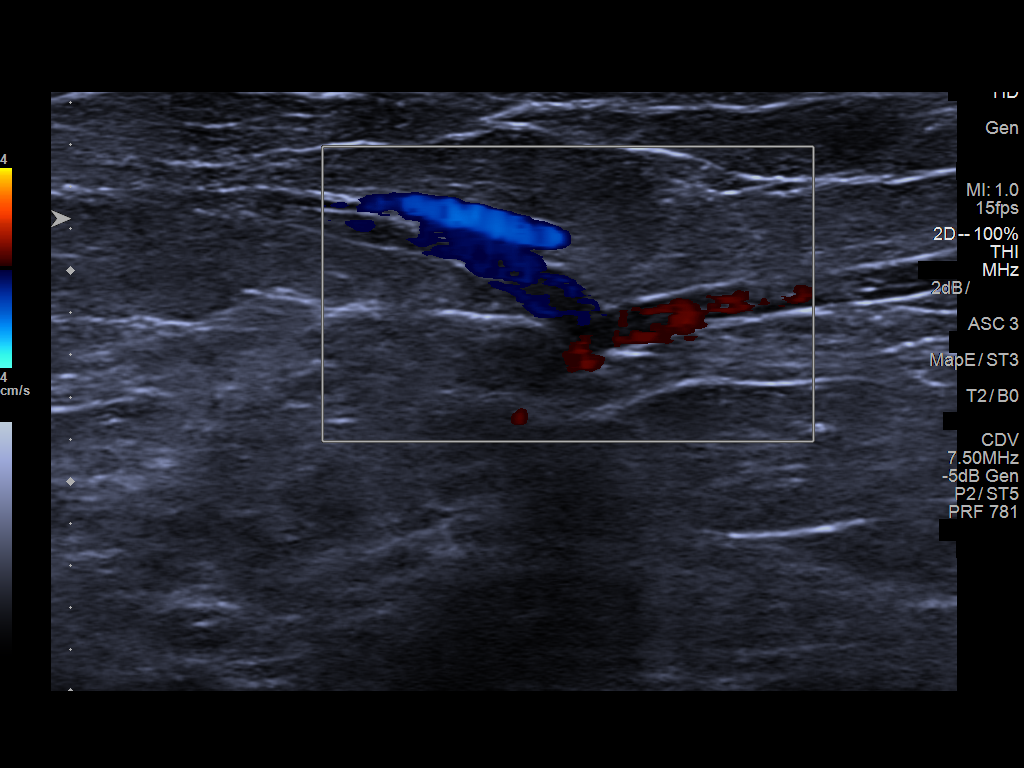

[3 of 3 positions shown; findings below may reference images not displayed]

ACR Breast Density Category b: There are scattered areas of
fibroglandular density.
FINDINGS: A BB has been placed on the superior aspect of the left breast
denoting the palpable area of thickening. There are no suspicious
mammographic findings deep to the palpable marker. No suspicious
calcifications, masses or areas of distortion are seen in the
bilateral breasts.

Mammographic images were processed with CAD.

Physical exam of the palpable area of concern demonstrates no
discrete palpable masses.

Ultrasound targeted to the 12 to 1 o'clock position in the left
breast demonstrates normal fibroglandular tissue. No masses or
suspicious areas of shadowing are identified.
IMPRESSION: 1. There is no mammographic or targeted sonographic abnormality at
the palpable site of concern.

2.  No mammographic evidence of malignancy in the bilateral breasts.

RECOMMENDATION:
1. Clinical follow-up recommended for the palpable area of concern
in the left breast. Any further workup should be based on clinical
grounds.

2.  Screening mammogram in one year.(Code:[id number])

I have discussed the findings and recommendations with the patient.
Results were also provided in writing at the conclusion of the
visit. If applicable, a reminder letter will be sent to the patient
regarding the next appointment.

BI-RADS CATEGORY  1: Negative.

## 2018-04-29 MED ORDER — FERROUS GLUCONATE 324 MG (37.5 MG IRON) TABLET
ORAL_TABLET | Freq: Every day | ORAL | 11 refills | 0 days | Status: CP
Start: 2018-04-29 — End: 2019-04-29

## 2018-04-29 MED ORDER — PANTOPRAZOLE 40 MG TABLET,DELAYED RELEASE
ORAL_TABLET | Freq: Two times a day (BID) | ORAL | 3 refills | 0 days | Status: CP
Start: 2018-04-29 — End: 2019-04-29

## 2018-04-30 ENCOUNTER — Telehealth: Payer: Self-pay

## 2018-04-30 NOTE — Telephone Encounter (Signed)
Call pt regarding lung screening left message at 11:01.

## 2018-05-02 ENCOUNTER — Telehealth: Payer: Self-pay | Admitting: *Deleted

## 2018-05-02 DIAGNOSIS — R918 Other nonspecific abnormal finding of lung field: Secondary | ICD-10-CM

## 2018-05-02 DIAGNOSIS — Z122 Encounter for screening for malignant neoplasm of respiratory organs: Secondary | ICD-10-CM

## 2018-05-02 DIAGNOSIS — Z87891 Personal history of nicotine dependence: Secondary | ICD-10-CM

## 2018-05-02 NOTE — Telephone Encounter (Signed)
Patient has been notified that lung cancer screening low dose CT scan is due currently or will be in near future. Confirmed that patient is within the age range of 55-77, and asymptomatic, (no signs or symptoms of lung cancer). Patient denies illness that would prevent curative treatment for lung cancer if found. Verified smoking history, (90 pack year, former, quit 2013). The shared decision making visit was done 11/03/16. Patient is agreeable for CT scan being scheduled.

## 2018-05-13 ENCOUNTER — Ambulatory Visit: Admit: 2018-05-13 | Discharge: 2018-05-14 | Payer: PRIVATE HEALTH INSURANCE

## 2018-05-13 DIAGNOSIS — J42 Unspecified chronic bronchitis: Principal | ICD-10-CM

## 2018-05-17 ENCOUNTER — Ambulatory Visit: Payer: PPO

## 2018-05-18 ENCOUNTER — Ambulatory Visit
Admission: RE | Admit: 2018-05-18 | Discharge: 2018-05-18 | Disposition: A | Discharge status: Home / Self Care | Payer: PPO | Source: Ambulatory Visit | Attending: Nurse Practitioner | Admitting: Nurse Practitioner

## 2018-05-18 DIAGNOSIS — I2510 Atherosclerotic heart disease of native coronary artery without angina pectoris: Secondary | ICD-10-CM | POA: Insufficient documentation

## 2018-05-18 DIAGNOSIS — I700 Atherosclerosis of aorta: Secondary | ICD-10-CM | POA: Insufficient documentation

## 2018-05-18 DIAGNOSIS — R918 Other nonspecific abnormal finding of lung field: Secondary | ICD-10-CM | POA: Diagnosis not present

## 2018-05-18 DIAGNOSIS — J439 Emphysema, unspecified: Secondary | ICD-10-CM | POA: Diagnosis not present

## 2018-05-18 DIAGNOSIS — D3501 Benign neoplasm of right adrenal gland: Secondary | ICD-10-CM | POA: Diagnosis not present

## 2018-05-18 DIAGNOSIS — Z122 Encounter for screening for malignant neoplasm of respiratory organs: Secondary | ICD-10-CM

## 2018-05-18 DIAGNOSIS — Z87891 Personal history of nicotine dependence: Secondary | ICD-10-CM

## 2018-05-18 DIAGNOSIS — D3502 Benign neoplasm of left adrenal gland: Secondary | ICD-10-CM | POA: Diagnosis not present

## 2018-05-18 DIAGNOSIS — J432 Centrilobular emphysema: Secondary | ICD-10-CM | POA: Diagnosis not present

## 2018-05-19 ENCOUNTER — Encounter: Payer: Self-pay | Admitting: *Deleted

## 2018-05-19 ENCOUNTER — Telehealth: Payer: Self-pay | Admitting: *Deleted

## 2018-05-19 NOTE — Telephone Encounter (Signed)
Notified patient of LDCT lung cancer screening program results with recommendation for 12 month follow up imaging.  Also notified of incidental findings noted below and is encouraged to discuss further questions with PCP who will receive a copy of this not and/or the CT reports.  Patient verbalized understanding.    IMPRESSION: 1. Lung-RADS 2S, benign appearance or behavior. Continue annual screening with low-dose chest CT without contrast in 12 months. 2. The "S" modifier above refers to potentially clinically significant non lung cancer related findings. Specifically, there is aortic atherosclerosis, in addition to left main and 3 vessel coronary artery disease. Please note that although the presence of coronary artery calcium documents the presence of coronary artery disease, the severity of this disease and any potential stenosis cannot be assessed on this non-gated CT examination. Assessment for potential risk factor modification, dietary therapy or pharmacologic therapy may be warranted, if clinically indicated. 3. Mild diffuse bronchial wall thickening with mild centrilobular and paraseptal emphysema; imaging findings suggestive of underlying COPD. 4. Bilateral adrenal adenomas incidentally noted.  Aortic Atherosclerosis (ICD10-I70.0) and Emphysema (ICD10-J43.9).

## 2018-05-26 ENCOUNTER — Emergency Department
Admit: 2018-05-26 | Discharge: 2018-05-26 | Disposition: A | Discharge status: Home / Self Care | Payer: PRIVATE HEALTH INSURANCE

## 2018-05-26 ENCOUNTER — Ambulatory Visit
Admit: 2018-05-26 | Discharge: 2018-05-26 | Disposition: A | Discharge status: Home / Self Care | Payer: PRIVATE HEALTH INSURANCE

## 2018-05-26 ENCOUNTER — Ambulatory Visit
Admit: 2018-05-26 | Discharge: 2018-05-27 | Payer: PRIVATE HEALTH INSURANCE | Attending: Student in an Organized Health Care Education/Training Program | Primary: Student in an Organized Health Care Education/Training Program

## 2018-05-26 DIAGNOSIS — R198 Other specified symptoms and signs involving the digestive system and abdomen: Principal | ICD-10-CM

## 2018-05-26 DIAGNOSIS — R079 Chest pain, unspecified: Principal | ICD-10-CM

## 2018-05-26 DIAGNOSIS — Z955 Presence of coronary angioplasty implant and graft: Secondary | ICD-10-CM | POA: Diagnosis not present

## 2018-05-26 DIAGNOSIS — I10 Essential (primary) hypertension: Secondary | ICD-10-CM | POA: Diagnosis not present

## 2018-05-26 DIAGNOSIS — E119 Type 2 diabetes mellitus without complications: Secondary | ICD-10-CM | POA: Diagnosis not present

## 2018-05-26 DIAGNOSIS — Z87891 Personal history of nicotine dependence: Secondary | ICD-10-CM | POA: Diagnosis not present

## 2018-05-26 DIAGNOSIS — J449 Chronic obstructive pulmonary disease, unspecified: Secondary | ICD-10-CM | POA: Diagnosis not present

## 2018-05-26 DIAGNOSIS — Z9049 Acquired absence of other specified parts of digestive tract: Secondary | ICD-10-CM | POA: Diagnosis not present

## 2018-05-26 DIAGNOSIS — I2510 Atherosclerotic heart disease of native coronary artery without angina pectoris: Secondary | ICD-10-CM | POA: Diagnosis not present

## 2018-05-26 DIAGNOSIS — R0602 Shortness of breath: Secondary | ICD-10-CM | POA: Diagnosis not present

## 2018-05-26 DIAGNOSIS — R110 Nausea: Secondary | ICD-10-CM | POA: Diagnosis not present

## 2018-05-26 DIAGNOSIS — R0789 Other chest pain: Secondary | ICD-10-CM | POA: Diagnosis not present

## 2018-05-26 DIAGNOSIS — E785 Hyperlipidemia, unspecified: Secondary | ICD-10-CM | POA: Diagnosis not present

## 2018-06-02 ENCOUNTER — Ambulatory Visit: Admit: 2018-06-02 | Discharge: 2018-06-02 | Payer: PRIVATE HEALTH INSURANCE

## 2018-06-02 ENCOUNTER — Institutional Professional Consult (permissible substitution): Admit: 2018-06-02 | Discharge: 2018-06-02 | Payer: PRIVATE HEALTH INSURANCE

## 2018-06-02 DIAGNOSIS — R140 Abdominal distension (gaseous): Principal | ICD-10-CM

## 2018-06-02 DIAGNOSIS — J449 Chronic obstructive pulmonary disease, unspecified: Principal | ICD-10-CM

## 2018-06-02 DIAGNOSIS — A049 Bacterial intestinal infection, unspecified: Secondary | ICD-10-CM | POA: Diagnosis not present

## 2018-06-06 MED ORDER — METRONIDAZOLE 500 MG TABLET
ORAL_TABLET | Freq: Three times a day (TID) | ORAL | 0 refills | 0 days | Status: CP
Start: 2018-06-06 — End: 2018-06-27

## 2018-06-06 MED ORDER — SULFAMETHOXAZOLE 800 MG-TRIMETHOPRIM 160 MG TABLET
ORAL_TABLET | Freq: Two times a day (BID) | ORAL | 0 refills | 0.00000 days | Status: CP
Start: 2018-06-06 — End: 2018-06-27

## 2018-06-07 ENCOUNTER — Ambulatory Visit: Admit: 2018-06-07 | Discharge: 2018-06-08 | Payer: PRIVATE HEALTH INSURANCE

## 2018-06-07 DIAGNOSIS — J449 Chronic obstructive pulmonary disease, unspecified: Principal | ICD-10-CM

## 2018-06-11 ENCOUNTER — Other Ambulatory Visit: Payer: Self-pay | Admitting: Family Medicine

## 2018-06-13 NOTE — Telephone Encounter (Signed)
Requested Prescriptions  Pending Prescriptions Disp Refills  . metFORMIN (GLUCOPHAGE) 500 MG tablet [Pharmacy Med Name: METFORMIN 500MG TAB] 360 tablet 1    Sig: TAKE TWO TABLETS BY MOUTH TWICE DAILY WITH MEALS     Endocrinology:  Diabetes - Biguanides Passed - 06/11/2018  9:19 AM      Passed - Cr in normal range and within 360 days    Creatinine  Date Value Ref Range Status  12/20/2014 0.85 mg/dL Final    Comment:    0.44-1.00 NOTE: New Reference Range  11/13/14    Creatinine, Ser  Date Value Ref Range Status  12/28/2017 0.98 0.57 - 1.00 mg/dL Final         Passed - HBA1C is between 0 and 7.9 and within 180 days    Hemoglobin A1C  Date Value Ref Range Status  09/01/2017 6.1  Final         Passed - eGFR in normal range and within 360 days    EGFR (African American)  Date Value Ref Range Status  12/20/2014 >60  Final   GFR calc Af Amer  Date Value Ref Range Status  12/28/2017 69 >59 mL/min/1.73 Final   EGFR (Non-African Amer.)  Date Value Ref Range Status  12/20/2014 >60  Final    Comment:    eGFR values <19m/min/1.73 m2 may be an indication of chronic kidney disease (CKD). Calculated eGFR is useful in patients with stable renal function. The eGFR calculation will not be reliable in acutely ill patients when serum creatinine is changing rapidly. It is not useful in patients on dialysis. The eGFR calculation may not be applicable to patients at the low and high extremes of body sizes, pregnant women, and vegetarians.    GFR calc non Af Amer  Date Value Ref Range Status  12/28/2017 59 (L) >59 mL/min/1.73 Final         Passed - Valid encounter within last 6 months    Recent Outpatient Visits          5 months ago Diabetes mellitus without complication ([facility]   [place]   6 months ago Acute cystitis with hematuria   [place]   8 months ago Diabetes mellitus without complication ([facility]   [place]   1 year ago Dysphonia spastica   [place]   1 year ago Mild protein-calorie malnutrition ([facility]   [place]      Future Appointments            In 2 weeks J[place] Emery [facility]b Mer[facility]

## 2018-06-15 ENCOUNTER — Ambulatory Visit: Admit: 2018-06-15 | Discharge: 2018-06-16 | Payer: PRIVATE HEALTH INSURANCE

## 2018-06-15 DIAGNOSIS — R0602 Shortness of breath: Principal | ICD-10-CM

## 2018-06-15 DIAGNOSIS — J439 Emphysema, unspecified: Secondary | ICD-10-CM

## 2018-06-15 DIAGNOSIS — Z6831 Body mass index (BMI) 31.0-31.9, adult: Secondary | ICD-10-CM | POA: Diagnosis not present

## 2018-06-22 DIAGNOSIS — L989 Disorder of the skin and subcutaneous tissue, unspecified: Secondary | ICD-10-CM | POA: Diagnosis not present

## 2018-06-22 DIAGNOSIS — L84 Corns and callosities: Secondary | ICD-10-CM | POA: Diagnosis not present

## 2018-06-22 DIAGNOSIS — M19071 Primary osteoarthritis, right ankle and foot: Secondary | ICD-10-CM | POA: Diagnosis not present

## 2018-06-27 ENCOUNTER — Ambulatory Visit: Admit: 2018-06-27 | Discharge: 2018-06-28 | Payer: PRIVATE HEALTH INSURANCE | Attending: Neurology | Primary: Neurology

## 2018-06-27 DIAGNOSIS — M1990 Unspecified osteoarthritis, unspecified site: Secondary | ICD-10-CM

## 2018-06-27 DIAGNOSIS — R5382 Chronic fatigue, unspecified: Secondary | ICD-10-CM

## 2018-06-27 DIAGNOSIS — G253 Myoclonus: Principal | ICD-10-CM

## 2018-06-27 DIAGNOSIS — G4719 Other hypersomnia: Secondary | ICD-10-CM

## 2018-06-27 DIAGNOSIS — J383 Other diseases of vocal cords: Secondary | ICD-10-CM

## 2018-06-27 DIAGNOSIS — Z6831 Body mass index (BMI) 31.0-31.9, adult: Secondary | ICD-10-CM | POA: Diagnosis not present

## 2018-06-27 MED ORDER — CLONAZEPAM 0.5 MG TABLET
ORAL_TABLET | Freq: Two times a day (BID) | ORAL | 0 refills | 0 days | Status: CP | PRN
Start: 2018-06-27 — End: 2018-09-27

## 2018-06-29 ENCOUNTER — Encounter: Payer: Self-pay | Admitting: Family Medicine

## 2018-06-29 ENCOUNTER — Ambulatory Visit (INDEPENDENT_AMBULATORY_CARE_PROVIDER_SITE_OTHER): Payer: PPO | Admitting: Family Medicine

## 2018-06-29 VITALS — BP 139/80 | HR 64 | Ht 65.2 in | Wt 195.6 lb

## 2018-06-29 DIAGNOSIS — E782 Mixed hyperlipidemia: Secondary | ICD-10-CM

## 2018-06-29 DIAGNOSIS — I10 Essential (primary) hypertension: Secondary | ICD-10-CM | POA: Diagnosis not present

## 2018-06-29 DIAGNOSIS — I2510 Atherosclerotic heart disease of native coronary artery without angina pectoris: Secondary | ICD-10-CM

## 2018-06-29 DIAGNOSIS — G4733 Obstructive sleep apnea (adult) (pediatric): Secondary | ICD-10-CM | POA: Diagnosis not present

## 2018-06-29 DIAGNOSIS — E119 Type 2 diabetes mellitus without complications: Secondary | ICD-10-CM | POA: Diagnosis not present

## 2018-06-29 DIAGNOSIS — J383 Other diseases of vocal cords: Secondary | ICD-10-CM

## 2018-06-29 DIAGNOSIS — E441 Mild protein-calorie malnutrition: Secondary | ICD-10-CM | POA: Diagnosis not present

## 2018-06-29 DIAGNOSIS — E1143 Type 2 diabetes mellitus with diabetic autonomic (poly)neuropathy: Secondary | ICD-10-CM | POA: Diagnosis not present

## 2018-06-29 DIAGNOSIS — J438 Other emphysema: Secondary | ICD-10-CM | POA: Diagnosis not present

## 2018-06-29 DIAGNOSIS — Z9989 Dependence on other enabling machines and devices: Secondary | ICD-10-CM | POA: Diagnosis not present

## 2018-06-29 DIAGNOSIS — E039 Hypothyroidism, unspecified: Secondary | ICD-10-CM | POA: Diagnosis not present

## 2018-06-29 DIAGNOSIS — D509 Iron deficiency anemia, unspecified: Secondary | ICD-10-CM | POA: Diagnosis not present

## 2018-06-29 LAB — UA/M W/RFLX CULTURE, ROUTINE
Bilirubin, UA: NEGATIVE
GLUCOSE, UA: NEGATIVE
Ketones, UA: NEGATIVE
LEUKOCYTES UA: NEGATIVE
Nitrite, UA: NEGATIVE
PH UA: 5 (ref 5.0–7.5)
PROTEIN UA: NEGATIVE
RBC UA: NEGATIVE
Urobilinogen, Ur: 0.2 mg/dL (ref 0.2–1.0)

## 2018-06-29 LAB — MICROALBUMIN, URINE WAIVED
Creatinine, Urine Waived: 50 mg/dL (ref 10–300)
MICROALB, UR WAIVED: 30 mg/L — AB (ref 0–19)

## 2018-06-29 MED ORDER — METFORMIN HCL 500 MG PO TABS: 500.0000 mg | ORAL_TABLET | Freq: Every day | ORAL | 1 refills | Status: DC

## 2018-06-29 MED ORDER — LISINOPRIL 5 MG PO TABS: 5.0000 mg | ORAL_TABLET | Freq: Every day | ORAL | 1 refills | Status: DC

## 2018-06-29 NOTE — Assessment & Plan Note (Signed)
Stable on current regimen. Continue to monitor. Call with any concerns. Continue to monitor. Refills given.  

## 2018-06-29 NOTE — Assessment & Plan Note (Signed)
Will get her into sleep medicine as she is still SIGNIFICANTLY and severely fatigued.

## 2018-06-29 NOTE — Assessment & Plan Note (Signed)
Weight stable. Continue to monitor. Call with any concerns.  

## 2018-06-29 NOTE — Assessment & Plan Note (Signed)
Rechecking levels today. Await results. Treat as needed.  

## 2018-06-29 NOTE — Assessment & Plan Note (Signed)
Under good control on current regimen. Continue current regimen. Continue to monitor. Call with any concerns. Refills given.   

## 2018-06-29 NOTE — Progress Notes (Signed)
BP 139/80 (BP Location: Left Arm, Patient Position: Sitting, Cuff Size: Normal)   Pulse 64   Ht 5' 5.2" (1.656 m)   Wt 195 lb 9 oz (88.7 kg)   SpO2 98%   BMI 32.34 kg/m    Subjective:    Patient ID: Leslie Olsen, female    DOB: June 04, 1951, 67 y.o.   MRN: [id number]  HPI: Leslie Olsen is a 67 y.o. female  Chief Complaint  Patient presents with  . Diabetes  . Hypertension  . Hyperlipidemia  . Hypothyroidism   Verita is not doing well. She is very frustrated today. She is upset about the dystonia. She is not feeling any better. She feels like a [place] pig. They have been trying her on a bunch of medicines that have not been helping. She has been following with neurology and last saw them 2 days ago, full note is not available at this time. When she saw neurology in August, they restarted her keppra and her clonazepam which she had stopped on her own as she was frustrated. Given her severe fatigue, she was started on ritalin to help- she is not still taking it as it didn't particularly help and ran her blood pressure up. Her neurologist wanted to get her back into see sleep medicine. She is concerned that she may have RA- having labs drawn through neurology.  She had a EGD in September.   HYPERTENSION / HYPERLIPIDEMIA Satisfied with current treatment? yes Duration of hypertension: chronic BP monitoring frequency: not checking BP medication side effects: no Past BP meds: lisinopril Duration of hyperlipidemia: chronic Cholesterol medication side effects: no Cholesterol supplements: none Medication compliance: excellent compliance Aspirin: yes Recent stressors: yes Recurrent headaches: no Visual changes: no Palpitations: no Dyspnea: no Chest pain: no Lower extremity edema: no Dizzy/lightheaded: no  DIABETES Hypoglycemic episodes:no Polydipsia/polyuria: no Visual disturbance: no Chest pain: no Paresthesias: no Glucose Monitoring: no Taking Insulin?: no Blood  Pressure Monitoring: not checking Retinal Examination: Not up to Date Foot Exam: Up to Date Diabetic Education: Completed Pneumovax: Up to Date Influenza: Up to Date Aspirin: yes  ANEMIA Anemia status: controlled Compliance with treatment: excellent compliance Iron supplementation side effects: no Severity of anemia: moderate Fatigue: yes Decreased exercise tolerance: yes  Dyspnea on exertion: no Palpitations: no Bleeding: no Pica: no  HYPOTHYROIDISM Thyroid control status:controlled Satisfied with current treatment? yes Medication side effects: no Medication compliance: excellent compliance Etiology of hypothyroidism:  Recent dose adjustment:no Fatigue: yes Cold intolerance: no Heat intolerance: no Weight gain: no Weight loss: no Constipation: no Diarrhea/loose stools: no Palpitations: no Lower extremity edema: no Anxiety/depressed mood: no  Relevant past medical, surgical, family and social history reviewed and updated as indicated. Interim medical history since our last visit reviewed. Allergies and medications reviewed and updated.  Review of Systems  Constitutional: Positive for fatigue. Negative for activity change, appetite change, chills, diaphoresis, fever and unexpected weight change.  Respiratory: Positive for chest tightness and shortness of breath. Negative for apnea, cough, choking, wheezing and stridor.   Cardiovascular: Negative.   Gastrointestinal: Positive for abdominal pain. Negative for abdominal distention, anal bleeding, blood in stool, constipation, diarrhea, nausea, rectal pain and vomiting.  Musculoskeletal: Negative.   Skin: Negative.   Neurological: Negative.   Psychiatric/Behavioral: Negative.     Per HPI unless specifically indicated above     Objective:    BP 139/80 (BP Location: Left Arm, Patient Position: Sitting, Cuff Size: Normal)   Pulse 64   Ht 5' 5.2" (  1.656 m)   Wt 195 lb 9 oz (88.7 kg)   SpO2 98%   BMI 32.34 kg/m     Wt Readings from Last 3 Encounters:  06/29/18 195 lb 9 oz (88.7 kg)  12/28/17 189 lb 5 oz (85.9 kg)  11/26/17 189 lb 7 oz (85.9 kg)    Physical Exam  Constitutional: She is oriented to person, place, and time. She appears well-developed and well-nourished. No distress.  HENT:  Head: Normocephalic and atraumatic.  Right Ear: Hearing normal.  Left Ear: Hearing normal.  Nose: Nose normal.  Eyes: Conjunctivae and lids are normal. Right eye exhibits no discharge. Left eye exhibits no discharge. No scleral icterus.  Cardiovascular: Normal rate, regular rhythm, normal heart sounds and intact distal pulses. Exam reveals no gallop and no friction rub.  No murmur heard. Pulmonary/Chest: Effort normal and breath sounds normal. No stridor. No respiratory distress. She has no wheezes. She has no rales. She exhibits no tenderness.  Musculoskeletal: Normal range of motion.  Neurological: She is alert and oriented to person, place, and time.  Skin: Skin is warm, dry and intact. Capillary refill takes less than 2 seconds. No rash noted. She is not diaphoretic. No erythema. No pallor.  Psychiatric: She has a normal mood and affect. Her speech is normal and behavior is normal. Judgment and thought content normal. Cognition and memory are normal.  Nursing note and vitals reviewed.   Results for orders placed or performed in visit on 12/28/17  CBC with Differential/Platelet  Result Value Ref Range   WBC 5.6 3.4 - 10.8 x10E3/uL   RBC 4.29 3.77 - 5.28 x10E6/uL   Hemoglobin 13.3 11.1 - 15.9 g/dL   Hematocrit 40.9 34.0 - 46.6 %   MCV 95 79 - 97 fL   MCH 31.0 26.6 - 33.0 pg   MCHC 32.5 31.5 - 35.7 g/dL   RDW 13.6 12.3 - 15.4 %   Platelets 291 150 - 379 x10E3/uL   Neutrophils 47 Not Estab. %   Lymphs 33 Not Estab. %   Monocytes 8 Not Estab. %   Eos 11 Not Estab. %   Basos 1 Not Estab. %   Neutrophils Absolute 2.6 1.4 - 7.0 x10E3/uL   Lymphocytes Absolute 1.9 0.7 - 3.1 x10E3/uL   Monocytes Absolute  0.4 0.1 - 0.9 x10E3/uL   EOS (ABSOLUTE) 0.6 (H) 0.0 - 0.4 x10E3/uL   Basophils Absolute 0.1 0.0 - 0.2 x10E3/uL   Immature Granulocytes 0 Not Estab. %   Immature Grans (Abs) 0.0 0.0 - 0.1 x10E3/uL  Bayer DCA Hb A1c Waived  Result Value Ref Range   HB A1C (BAYER DCA - WAIVED) 5.8 <7.0 %  Comprehensive metabolic panel  Result Value Ref Range   Glucose 115 (H) 65 - 99 mg/dL   BUN 19 8 - 27 mg/dL   Creatinine, Ser 0.98 0.57 - 1.00 mg/dL   GFR calc non Af Amer 59 (L) >59 mL/min/1.73   GFR calc Af Amer 69 >59 mL/min/1.73   BUN/Creatinine Ratio 19 12 - 28   Sodium 135 134 - 144 mmol/L   Potassium 4.8 3.5 - 5.2 mmol/L   Chloride 99 96 - 106 mmol/L   CO2 24 20 - 29 mmol/L   Calcium 10.0 8.7 - 10.3 mg/dL   Total Protein 7.8 6.0 - 8.5 g/dL   Albumin 4.6 3.6 - 4.8 g/dL   Globulin, Total 3.2 1.5 - 4.5 g/dL   Albumin/Globulin Ratio 1.4 1.2 - 2.2   Bilirubin Total 0.3  0.0 - 1.2 mg/dL   Alkaline Phosphatase 93 39 - 117 IU/L   AST 20 0 - 40 IU/L   ALT 13 0 - 32 IU/L  Lipid Panel w/o Chol/HDL Ratio  Result Value Ref Range   Cholesterol, Total 265 (H) 100 - 199 mg/dL   Triglycerides 198 (H) 0 - 149 mg/dL   HDL 47 >39 mg/dL   VLDL Cholesterol Cal 40 5 - 40 mg/dL   LDL Calculated 178 (H) 0 - 99 mg/dL  Microalbumin, Urine Waived  Result Value Ref Range   Microalb, Ur Waived 30 (H) 0 - 19 mg/L   Creatinine, Urine Waived 100 10 - 300 mg/dL   Microalb/Creat Ratio <30 <30 mg/g  Thyroid Panel With TSH  Result Value Ref Range   TSH 0.793 0.450 - 4.500 uIU/mL   T4, Total 8.4 4.5 - 12.0 ug/dL   T3 Uptake Ratio 26 24 - 39 %   Free Thyroxine Index 2.2 1.2 - 4.9      Assessment & Plan:   Problem List Items Addressed This Visit      Cardiovascular and Mediastinum   Essential hypertension, benign - Primary    Under good control on current regimen. Continue current regimen. Continue to monitor. Call with any concerns. Refills given.        Relevant Medications   lisinopril (PRINIVIL,ZESTRIL) 5  MG tablet   Other Relevant Orders   CBC with Differential/Platelet   Comprehensive metabolic panel   Microalbumin, Urine Waived   TSH   UA/M w/rflx Culture, Routine   Atherosclerosis of native coronary artery of native heart without angina pectoris    Stable. Not having chest pain. Continue to monitor. Call with any concerns.       Relevant Medications   lisinopril (PRINIVIL,ZESTRIL) 5 MG tablet   Other Relevant Orders   CBC with Differential/Platelet   Comprehensive metabolic panel   TSH   UA/M w/rflx Culture, Routine     Respiratory   COPD (chronic obstructive pulmonary disease) (HCC)   Relevant Orders   CBC with Differential/Platelet   Comprehensive metabolic panel   TSH   UA/M w/rflx Culture, Routine   OSA on CPAP    Will get her into sleep medicine as she is still SIGNIFICANTLY and severely fatigued.         Endocrine   Thyroid activity decreased   Relevant Orders   CBC with Differential/Platelet   Comprehensive metabolic panel   TSH   UA/M w/rflx Culture, Routine   Type 2 diabetes mellitus with diabetic autonomic neuropathy, without long-term current use of insulin (HCC)    Stable on current regimen. Continue to monitor. Call with any concerns. Continue to monitor. Refills given.       Relevant Medications   lisinopril (PRINIVIL,ZESTRIL) 5 MG tablet   metFORMIN (GLUCOPHAGE) 500 MG tablet   RESOLVED: Diabetes mellitus without complication (HCC)    Stable on current regimen. Continue to monitor. Call with any concerns. Continue to monitor. Refills given.       Relevant Medications   lisinopril (PRINIVIL,ZESTRIL) 5 MG tablet   metFORMIN (GLUCOPHAGE) 500 MG tablet   Other Relevant Orders   Bayer DCA Hb A1c Waived   CBC with Differential/Platelet   Comprehensive metabolic panel   TSH   UA/M w/rflx Culture, Routine     Nervous and Auditory   Dysphonia spastica    Not doing well at all. Continues to work with neurology. Call with any concerns.  Other   Hyperlipidemia    Rechecking levels today. Await results. Treat as needed. Call with any concerns.       Relevant Medications   lisinopril (PRINIVIL,ZESTRIL) 5 MG tablet   Other Relevant Orders   CBC with Differential/Platelet   Comprehensive metabolic panel   Lipid Panel w/o Chol/HDL Ratio   TSH   UA/M w/rflx Culture, Routine   Iron deficiency anemia    Rechecking levels today. Await results. Treat as needed.       Relevant Medications   ferrous gluconate (FERGON) 324 MG tablet   Other Relevant Orders   CBC with Differential/Platelet   Comprehensive metabolic panel   Iron and TIBC   TSH   UA/M w/rflx Culture, Routine   Malnutrition (HCC)    Weight stable. Continue to monitor. Call with any concerns.       Relevant Orders   CBC with Differential/Platelet   Comprehensive metabolic panel   TSH   UA/M w/rflx Culture, Routine    Other Visit Diagnoses    OSA (obstructive sleep apnea)       Relevant Orders   Ambulatory referral to Sleep Studies       Follow up plan: Return in about 3 months (around 09/29/2018) for Physical/wellness.

## 2018-06-29 NOTE — Assessment & Plan Note (Signed)
Rechecking levels today. Await results. Treat as needed. Call with any concerns.  

## 2018-06-29 NOTE — Assessment & Plan Note (Signed)
Stable. Not having chest pain. Continue to monitor. Call with any concerns.

## 2018-06-29 NOTE — Assessment & Plan Note (Signed)
Not doing well at all. Continues to work with neurology. Call with any concerns.

## 2018-06-30 ENCOUNTER — Encounter: Payer: Self-pay | Admitting: Family Medicine

## 2018-06-30 LAB — IRON AND TIBC
IRON: 109 ug/dL (ref 27–139)
Iron Saturation: 39 % (ref 15–55)
Total Iron Binding Capacity: 276 ug/dL (ref 250–450)
UIBC: 167 ug/dL (ref 118–369)

## 2018-06-30 LAB — CBC WITH DIFFERENTIAL/PLATELET
BASOS ABS: 0.1 10*3/uL (ref 0.0–0.2)
Basos: 2 %
EOS (ABSOLUTE): 0.2 10*3/uL (ref 0.0–0.4)
Eos: 5 %
Hematocrit: 35 % (ref 34.0–46.6)
Hemoglobin: 11.9 g/dL (ref 11.1–15.9)
Immature Grans (Abs): 0 10*3/uL (ref 0.0–0.1)
Immature Granulocytes: 0 %
LYMPHS ABS: 1.9 10*3/uL (ref 0.7–3.1)
Lymphs: 43 %
MCH: 31.1 pg (ref 26.6–33.0)
MCHC: 34 g/dL (ref 31.5–35.7)
MCV: 91 fL (ref 79–97)
Monocytes Absolute: 0.4 10*3/uL (ref 0.1–0.9)
Monocytes: 9 %
NEUTROS ABS: 1.8 10*3/uL (ref 1.4–7.0)
Neutrophils: 41 %
PLATELETS: 280 10*3/uL (ref 150–450)
RBC: 3.83 x10E6/uL (ref 3.77–5.28)
RDW: 12.5 % (ref 12.3–15.4)
WBC: 4.5 10*3/uL (ref 3.4–10.8)

## 2018-06-30 LAB — COMPREHENSIVE METABOLIC PANEL
ALK PHOS: 77 IU/L (ref 39–117)
ALT: 20 IU/L (ref 0–32)
AST: 19 IU/L (ref 0–40)
Albumin/Globulin Ratio: 1.7 (ref 1.2–2.2)
Albumin: 4.6 g/dL (ref 3.6–4.8)
BUN/Creatinine Ratio: 15 (ref 12–28)
BUN: 16 mg/dL (ref 8–27)
Bilirubin Total: 0.3 mg/dL (ref 0.0–1.2)
CHLORIDE: 101 mmol/L (ref 96–106)
CO2: 22 mmol/L (ref 20–29)
Calcium: 9.8 mg/dL (ref 8.7–10.3)
Creatinine, Ser: 1.1 mg/dL — ABNORMAL HIGH (ref 0.57–1.00)
GFR calc Af Amer: 59 mL/min/{1.73_m2} — ABNORMAL LOW (ref >59)
GFR calc non Af Amer: 51 mL/min/{1.73_m2} — ABNORMAL LOW (ref >59)
GLUCOSE: 105 mg/dL — AB (ref 65–99)
Globulin, Total: 2.7 g/dL (ref 1.5–4.5)
Potassium: 4.9 mmol/L (ref 3.5–5.2)
Sodium: 138 mmol/L (ref 134–144)
Total Protein: 7.3 g/dL (ref 6.0–8.5)

## 2018-06-30 LAB — LIPID PANEL W/O CHOL/HDL RATIO
Cholesterol, Total: 245 mg/dL — ABNORMAL HIGH (ref 100–199)
HDL: 42 mg/dL (ref >39)
LDL Calculated: 154 mg/dL — ABNORMAL HIGH (ref 0–99)
Triglycerides: 246 mg/dL — ABNORMAL HIGH (ref 0–149)
VLDL Cholesterol Cal: 49 mg/dL — ABNORMAL HIGH (ref 5–40)

## 2018-06-30 LAB — TSH: TSH: 1.17 u[IU]/mL (ref 0.450–4.500)

## 2018-07-01 DIAGNOSIS — E119 Type 2 diabetes mellitus without complications: Secondary | ICD-10-CM | POA: Diagnosis not present

## 2018-07-01 DIAGNOSIS — M779 Enthesopathy, unspecified: Secondary | ICD-10-CM | POA: Diagnosis not present

## 2018-07-01 DIAGNOSIS — Q6689 Other  specified congenital deformities of feet: Secondary | ICD-10-CM | POA: Diagnosis not present

## 2018-07-05 LAB — SPECIMEN STATUS REPORT

## 2018-07-05 LAB — HGB A1C W/O EAG: HEMOGLOBIN A1C: 5.9 % — AB (ref 4.8–5.6)

## 2018-08-22 ENCOUNTER — Encounter: Payer: Self-pay | Admitting: Neurology

## 2018-08-22 ENCOUNTER — Ambulatory Visit (INDEPENDENT_AMBULATORY_CARE_PROVIDER_SITE_OTHER): Payer: PPO | Admitting: Neurology

## 2018-08-22 VITALS — BP 147/75 | HR 65 | Ht 66.0 in | Wt 197.0 lb

## 2018-08-22 DIAGNOSIS — Z9989 Dependence on other enabling machines and devices: Secondary | ICD-10-CM | POA: Diagnosis not present

## 2018-08-22 DIAGNOSIS — J383 Other diseases of vocal cords: Secondary | ICD-10-CM | POA: Diagnosis not present

## 2018-08-22 DIAGNOSIS — I2510 Atherosclerotic heart disease of native coronary artery without angina pectoris: Secondary | ICD-10-CM | POA: Diagnosis not present

## 2018-08-22 DIAGNOSIS — G4733 Obstructive sleep apnea (adult) (pediatric): Secondary | ICD-10-CM

## 2018-08-22 DIAGNOSIS — I2583 Coronary atherosclerosis due to lipid rich plaque: Secondary | ICD-10-CM

## 2018-08-22 DIAGNOSIS — R0602 Shortness of breath: Secondary | ICD-10-CM | POA: Insufficient documentation

## 2018-08-22 DIAGNOSIS — T733XXA Exhaustion due to excessive exertion, initial encounter: Secondary | ICD-10-CM

## 2018-08-22 DIAGNOSIS — G4719 Other hypersomnia: Secondary | ICD-10-CM | POA: Diagnosis not present

## 2018-08-22 HISTORY — DX: Exhaustion due to excessive exertion, initial encounter: T73.3XXA

## 2018-08-22 NOTE — Progress Notes (Signed)
SLEEP MEDICINE CLINIC   Provider:  Larey Seat, MD   Primary Care Physician:  Valerie Roys, DO   Referring Provider: Valerie Roys, DO    Chief Complaint  Patient presents with  . New Patient (Initial Visit)    pt alone, rm 11. pt has had her current machine since sept 2014 and was purchased with a company in [place], [place]. last sleep study in 2013. pt feels that her current CPAP is working well. her PCP she is diagnosed with dystonia 2-3 years ago she sees a neurologist in [place] for that but her PCP wanted her to evaluate her CPAP. she doesnt think CPAP is the issue that is going on she states that she wakes up barely able to carry a conversation.Marland Kitchen     HPI:  Leslie Olsen is a 67 y.o. female patient on klonopin and Keppra bid for chest tension- dysphonia , seen here as in a referral/ revisit  from Dr. Wynetta Emery for re-evaluation of OSA- patient on CPAP for 5 years.  Dear Dr. Wynetta Emery,  The patient reported her expectation that I can address her movement disorder/ neuromuscular issues and am a specialist (?) for these . These specialists are tertiary care center based [facility] and she is established there). I am an epileptologist ( CNS neurophysiology)  and sleep specialist. I don't even evaluate primary movement , neuromuscular disorders. The patient has seen Dr. 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  She has been treated for hyperlipidemia, she has diabetes type 2, she has a thyroid dysfunction she is treated for obstructive sleep apnea with CPAP and has been compliant she reports 5 years ago she received the current machine and has used it every night.  She remains excessively daytime sleepy and fatigued.  She has a history of COPD a primary pulmonary disease, she has coronary artery disease without chest pain and she  has essential hypertension.  She also states that she was diagnosed with a reduced creatinine clearance, looking at her 12-23-2017  lab tests her GFR was 59 which is absolutely normal.She brought her CPAP here.      Chief complaint according to patient : "My CPAP works well- I don't have a sleep problem" and " I am tired"    Sleep habits are as follows:  Suppertime is around 6 Pm and she will be going to bed at 7 PM, feeling fatigued, She uses CPAP and is asleep in seconds. She sleeps on her side, on one pillow. Nocturia times 4-5 , which can be an indication of residual apnea. She wakes up many times- rises at variable times, 6 AM . Will make a coffee and go back to bed- stays there until midday!  That sounds depressed- which the patient violently denies. .    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 ?.  Family sleep history: none    Social history: lives alone, family lives close. Smoked for 45 years, quit 6 years ago, ETOH- one, caffeine in AM in form of coffee. 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    Review of Systems: Out of a complete 14 system review, the patient complains of only the following symptoms, and all other reviewed systems are  negative.   hoarseness, dysphonic,  Epworth score 2/ 24 , Fatigue severity score 63/ 63   , depression score - declined.    Social History   Socioeconomic History  . Marital status: Divorced    Spouse name: Not on file  . Number of children: Not on file  . Years of education: 61  . Highest education level: 12th grade  Occupational History  . Not on file  Social Needs  . Financial resource strain: Not hard at all  . Food insecurity:    Worry: Never true    Inability: Never true  . Transportation needs:    Medical: No    Non-medical: No  Tobacco Use  . Smoking status: Former Smoker    Packs/day: 2.00    Years: 45.00    Pack  years: 90.00    Types: Cigarettes    Last attempt to quit: 11/06/2011    Years since quitting: 6.7  . Smokeless tobacco: Never Used  Substance and Sexual Activity  . Alcohol use: No  . Drug use: No  . Sexual activity: Never  Lifestyle  . Physical activity:    Days per week: 0 days    Minutes per session: 0 min  . Stress: Only a little  Relationships  . Social connections:    Talks on phone: More than three times a week    Gets together: Once a week    Attends religious service: More than 4 times per year    Active member of club or organization: No    Attends meetings of clubs or organizations: Never    Relationship status: Divorced  . Intimate partner violence:    Fear of current or ex partner: No    Emotionally abused: No    Physically abused: No    Forced sexual activity: No  Other Topics Concern  . Not on file  Social History Narrative   Lives alone.  She has one grown son.   She works as a Retail buyer at [employer]   Highest level of education was high school    Family History  Problem Relation Age of Onset  . Lung cancer Maternal Aunt   . Breast cancer Maternal Aunt 57  . Other Mother        Killed in car accident, 46  . Other Father        Killed in car accident, 72  . Thyroid disease Brother   . Thyroid disease Sister     Past Medical History:  Diagnosis Date  . Anemia   . Chronic headaches   . COPD (chronic obstructive pulmonary disease) ([place])   . Diabetes mellitus without complication ([place])   . GERD (gastroesophageal reflux disease)   . Hypertension   . Hypothyroidism     Past Surgical History:  Procedure Laterality Date  . ABDOMINAL HYSTERECTOMY    . BACK SURGERY    . CHOLECYSTECTOMY    . CORONARY STENT PLACEMENT    . ESOPHAGOGASTRODUODENOSCOPY (EGD) WITH PROPOFOL N/A 05/26/2016   Procedure: ESOPHAGOGASTRODUODENOSCOPY (EGD) WITH PROPOFOL;  Surgeon: Lucilla Lame, MD;  Location: [facility] ENDOSCOPY;  Service: Endoscopy;  Laterality: N/A;  . GIVENS  CAPSULE STUDY N/A 05/05/2016   Procedure: GIVENS CAPSULE STUDY;  Surgeon: Lucilla Lame, MD;  Location: [facility] ENDOSCOPY;  Service: Endoscopy;  Laterality: N/A;  . REDUCTION MAMMAPLASTY    . REPLACEMENT TOTAL KNEE    . SHOULDER SURGERY    . TIBIA FRACTURE SURGERY      Current Outpatient Medications  Medication Sig Dispense Refill  . aspirin 81 MG tablet Take 81 mg by mouth daily.    . clonazePAM (KLONOPIN) 0.5 MG tablet Take 0.5 mg by mouth at bedtime.     . ferrous gluconate (FERGON) 324 MG tablet Take 324 mg by mouth daily.  11  . glucose blood (ONE TOUCH ULTRA TEST) test strip USE ONE STRIP TO CHECK GLUCOSE TWICE DAILY 100 each 12  . levETIRAcetam (KEPPRA) 500 MG tablet     . lisinopril (PRINIVIL,ZESTRIL) 5 MG tablet Take 1 tablet (5 mg total) by mouth daily. 90 tablet 1  . metFORMIN (GLUCOPHAGE) 500 MG tablet Take 1 tablet (500 mg total) by mouth daily with breakfast. 90 tablet 1  . pantoprazole (PROTONIX) 40 MG tablet Take 40 mg by mouth 2 (two) times daily.  3  . SUMAtriptan (IMITREX) 100 MG tablet Take 1 tablet (100 mg total) by mouth every 2 (two) hours as needed for migraine. May repeat in 2 hours if headache persists or recurs. 10 tablet 12  . SYNTHROID 100 MCG tablet Take 1 tablet (100 mcg total) by mouth daily before breakfast. 90 tablet 3   No current facility-administered medications for this visit.     Allergies as of 08/22/2018  . (No Known Allergies)    Vitals: BP (!) 147/75   Pulse 65   Ht 5\' 6"  (1.676 m)   Wt 197 lb (89.4 kg)   BMI 31.80 kg/m  Last Weight:  Wt Readings from Last 1 Encounters:  08/22/18 197 lb (89.4 kg)   YQM:VHQI mass index is 31.8 kg/m.     Last Height:   Ht Readings from Last 1 Encounters:  08/22/18 5\' 6"  (1.676 m)    Physical exam:  General: The patient is awake, alert and appears not in acute distress. The patient is well groomed. Head: Normocephalic, atraumatic. Neck is supple. Mallampati 4,  neck circumference: 15.75 ". Nasal  airflow patent ,  Cardiovascular:  Regular rate and rhythm 68 bpm , without  murmurs or carotid bruit, and without distended neck veins. Respiratory: Lungs are clear to auscultation. Skin:  Without evidence of edema, or rash Trunk: BMI is 32. The patient's posture is stooped.   Neurologic exam : Attention span & concentration ability appears normal.  Speech is fluent,  without dysarthria, dysphonia or aphasia.  Mood and affect are appropriate.  Cranial nerves: Pupils are equal and briskly reactive to light. Extraocular movements  in vertical and horizontal planes intact and without nystagmus. Visual fields by finger perimetry are intact.Hearing to finger rub intact.   Facial sensation intact to fine touch. Facial motor strength is symmetric and tongue and uvula move midline. Shoulder shrug was symmetrical.   Motor exam:  Normal tone, muscle bulk and symmetric strength in all extremities. Sensory:  Fine touch, pinprick and vibration ,. Proprioception tested in the upper extremities were  normal. Coordination: Rapid alternating movements in the fingers/hands was normal. Finger-to-nose maneuver  normal without evidence of ataxia, dysmetria or tremor. Gait and station: Patient walks without assistive device and is able unassisted to climb up to the exam table. Strength within normal limits.  Stance is stable and normal.   Deep tendon reflexes: in the  upper and lower extremities are symmetric and intact. Babinski maneuver response is downgoing.   Assessment:  After physical and neurologic examination, review of laboratory studies,  Personal review of imaging studies, reports of other /same  Imaging studies, results of polysomnography and / or neurophysiology testing and pre-existing  records as far as provided in visit., my assessment is:   1) patient is very , very frustrated after I told her I can't treat dysphonia. The ENT and neurological liason at [facility] will have to follow that.  If you like  to ave your patient followed locally, please chose a movement disorder / neuromuscular specialist My colleague Dr Rexene Alberts .   2) I will evaluate her need for CPAP- degree of OSA. She has been  CPAP user and is overwhelmingly fatigued.  I will not breech the subject of depression with her again.   3) Patient reports that her pulmonologist at [facility] had told her she is only affected by mild COPD- no Inhaler needed.      The patient was advised of the nature of the diagnosed disorder , the treatment options and the  risks for general health and wellness arising from not treating the condition.   I spent more than 45 minutes of face to face time with the patient.  Greater than 50% of time was spent in counseling and coordination of care. We have discussed the diagnosis and differential and I answered the patient's questions.    Plan:  Treatment plan and additional workup :   baseline HST- while not on CPAP- to get new baseline. Watch for hypoxemia.  She sleeps in an adjusted bed , 12 degrees elevation  Chest wall abnormalities will allow for in lab study but patient feels she is likelier to sleep well at home.   I cannot take dysphonia treatment over -   Larey Seat, MD 91/79/1505, 69:79 AM  Certified in Neurology by [employer]BP[employer] Certified in [place] by [facility] [address] [place], [place] [address]

## 2018-09-25 ENCOUNTER — Other Ambulatory Visit: Payer: Self-pay | Admitting: Family Medicine

## 2018-09-27 MED ORDER — CLONAZEPAM 0.5 MG TABLET
0 refills | 0 days | Status: CP
Start: 2018-09-27 — End: 2018-12-28

## 2018-09-28 ENCOUNTER — Other Ambulatory Visit: Payer: Self-pay | Admitting: Family Medicine

## 2018-09-28 DIAGNOSIS — Z1231 Encounter for screening mammogram for malignant neoplasm of breast: Secondary | ICD-10-CM

## 2018-10-06 ENCOUNTER — Ambulatory Visit: Payer: PPO | Admitting: Family Medicine

## 2018-10-06 NOTE — Progress Notes (Signed)
BP 131/83   Pulse 95   Temp 97.8 F (36.6 C) (Oral)   Ht 5' 5.5" (1.664 m)   Wt 193 lb (87.5 kg)   SpO2 94%   BMI 31.63 kg/m    Subjective:    Patient ID: Leslie Olsen, female    DOB: 1951/08/10, 68 y.o.   MRN: [id number]  HPI: Leslie Olsen is a 68 y.o. female presenting on 10/07/2018 for comprehensive medical examination. Current medical complaints include:  DIABETES Hypoglycemic episodes:no Polydipsia/polyuria: no Visual disturbance: no Chest pain: no Paresthesias: no Glucose Monitoring: yes  Accucheck frequency: Daily Taking Insulin?: no Blood Pressure Monitoring: not checking Retinal Examination: Not up to Date Foot Exam: Done today Diabetic Education: Completed Pneumovax: Up to Date Influenza: Up to Date Aspirin: yes  UPPER RESPIRATORY TRACT INFECTION Duration: 2-3 days Worst symptom: congestion Fever: no Cough: yes Shortness of breath: no Wheezing: no Chest pain: no Chest tightness: yes Chest congestion: no Nasal congestion: yes Runny nose: yes Post nasal drip: yes Sneezing: yes Sore throat: yes Swollen glands: no Sinus pressure: yes Headache: yes Face pain: no Toothache: no Ear pain: no  Ear pressure: no  Eyes red/itching:no Eye drainage/crusting: no  Vomiting: no Rash: no Fatigue: yes Sick contacts: yes Strep contacts: no  Context: stable Recurrent sinusitis: no Relief with OTC cold/cough medications: no  Treatments attempted: none and anti-histamine    She currently lives with: alone Menopausal Symptoms: no  Functional Status Survey: Is the patient deaf or have difficulty hearing?: No Does the patient have difficulty seeing, even when wearing glasses/contacts?: No Does the patient have difficulty concentrating, remembering, or making decisions?: No Does the patient have difficulty walking or climbing stairs?: Yes Does the patient have difficulty dressing or bathing?: No Does the patient have difficulty doing errands alone  such as visiting a doctor's office or shopping?: Yes  Fall Risk  09/09/2017 09/03/2016 02/21/2016  Falls in the past year? No No No    Depression Screen Depression screen [facility] 2/9 10/07/2018 09/09/2017 09/03/2016 02/21/2016  Decreased Interest 0 0 0 0  Down, Depressed, Hopeless 0 0 0 0  PHQ - 2 Score 0 0 0 0  Altered sleeping 1 - 2 -  Tired, decreased energy 3 - 1 -  Change in appetite 1 - 3 -  Feeling bad or failure about yourself  0 - 0 -  Trouble concentrating 0 - 0 -  Moving slowly or fidgety/restless 1 - 1 -  Suicidal thoughts 0 - 0 -  PHQ-9 Score 6 - 7 -  Difficult doing work/chores Very difficult - - -   Advanced Directives Does patient have a HCPOA?    yes Does patient have a living will or MOST form?  yes  Past Medical History:  Past Medical History:  Diagnosis Date  . Anemia   . Cephalalgia 04/08/2015  . Chronic headaches   . COPD (chronic obstructive pulmonary disease) ([place])   . Diabetes mellitus without complication ([place])   . Fatigue due to excessive exertion 08/22/2018  . GERD (gastroesophageal reflux disease)   . Hypertension   . Hypothyroidism   . [facility] spotted fever) 07/27/2016    Surgical History:  Past Surgical History:  Procedure Laterality Date  . ABDOMINAL HYSTERECTOMY    . BACK SURGERY    . CHOLECYSTECTOMY    . CORONARY STENT PLACEMENT    . ESOPHAGOGASTRODUODENOSCOPY (EGD) WITH PROPOFOL N/A 05/26/2016   Procedure: ESOPHAGOGASTRODUODENOSCOPY (EGD) WITH PROPOFOL;  Surgeon: Lucilla Lame, MD;  Location: [facility] ENDOSCOPY;  Service: Endoscopy;  Laterality: N/A;  . GIVENS CAPSULE STUDY N/A 05/05/2016   Procedure: GIVENS CAPSULE STUDY;  Surgeon: Lucilla Lame, MD;  Location: [facility] ENDOSCOPY;  Service: Endoscopy;  Laterality: N/A;  . REDUCTION MAMMAPLASTY    . REPLACEMENT TOTAL KNEE    . SHOULDER SURGERY    . TIBIA FRACTURE SURGERY      Medications:  Current Outpatient Medications on File Prior to Visit  Medication Sig  . aspirin 81 MG tablet Take  81 mg by mouth daily.  . clonazePAM (KLONOPIN) 0.5 MG tablet Take 0.5 mg by mouth at bedtime.   . ferrous gluconate (FERGON) 324 MG tablet Take 324 mg by mouth daily.  Marland Kitchen levETIRAcetam (KEPPRA) 500 MG tablet   . lisinopril (PRINIVIL,ZESTRIL) 5 MG tablet Take 1 tablet (5 mg total) by mouth daily.  . metFORMIN (GLUCOPHAGE) 500 MG tablet Take 1 tablet (500 mg total) by mouth daily with breakfast.  . pantoprazole (PROTONIX) 40 MG tablet Take 40 mg by mouth 2 (two) times daily.   No current facility-administered medications on file prior to visit.     Allergies:  No Known Allergies  Social History:  Social History   Socioeconomic History  . Marital status: Divorced    Spouse name: Not on file  . Number of children: Not on file  . Years of education: 67  . Highest education level: 12th grade  Occupational History  . Not on file  Social Needs  . Financial resource strain: Not hard at all  . Food insecurity:    Worry: Never true    Inability: Never true  . Transportation needs:    Medical: No    Non-medical: No  Tobacco Use  . Smoking status: Former Smoker    Packs/day: 2.00    Years: 45.00    Pack years: 90.00    Types: Cigarettes    Last attempt to quit: 11/06/2011    Years since quitting: 6.9  . Smokeless tobacco: Never Used  Substance and Sexual Activity  . Alcohol use: No  . Drug use: No  . Sexual activity: Never  Lifestyle  . Physical activity:    Days per week: 0 days    Minutes per session: 0 min  . Stress: Only a little  Relationships  . Social connections:    Talks on phone: More than three times a week    Gets together: Once a week    Attends religious service: More than 4 times per year    Active member of club or organization: No    Attends meetings of clubs or organizations: Never    Relationship status: Divorced  . Intimate partner violence:    Fear of current or ex partner: No    Emotionally abused: No    Physically abused: No    Forced sexual  activity: No  Other Topics Concern  . Not on file  Social History Narrative   Lives alone.  She has one grown son.   She works as a Retail buyer at [employer]   Highest level of education was high school   Social History   Tobacco Use  Smoking Status Former Smoker  . Packs/day: 2.00  . Years: 45.00  . Pack years: 90.00  . Types: Cigarettes  . Last attempt to quit: 11/06/2011  . Years since quitting: 6.9  Smokeless Tobacco Never Used   Social History   Substance and Sexual Activity  Alcohol Use No    Family History:  Family History  Problem Relation Age of Onset  . Lung cancer Maternal Aunt   . Breast cancer Maternal Aunt 57  . Other Mother        Killed in car accident, 51  . Other Father        Killed in car accident, 15  . Thyroid disease Brother   . Thyroid disease Sister     Past medical history, surgical history, medications, allergies, family history and social history reviewed with patient today and changes made to appropriate areas of the chart.   Review of Systems  Constitutional: Negative.   HENT: Positive for congestion and sinus pain. Negative for ear discharge, ear pain, hearing loss, nosebleeds, sore throat and tinnitus.   Eyes: Negative.   Respiratory: Negative.  Negative for stridor.   Cardiovascular: Positive for leg swelling. Negative for chest pain, palpitations, orthopnea, claudication and PND.  Gastrointestinal: Positive for constipation and heartburn. Negative for abdominal pain, blood in stool, diarrhea, melena, nausea and vomiting.  Genitourinary: Negative.   Musculoskeletal: Positive for joint pain (both hips). Negative for back pain, falls, myalgias and neck pain.  Skin: Negative.   Neurological: Positive for speech change and weakness. Negative for tingling, tremors, sensory change, focal weakness, seizures, loss of consciousness and headaches.  Endo/Heme/Allergies: Negative.   Psychiatric/Behavioral: Positive for depression. Negative for  hallucinations, memory loss, substance abuse and suicidal ideas. The patient is not nervous/anxious and does not have insomnia.     All other ROS negative except what is listed above and in the HPI.      Objective:    BP 131/83   Pulse 95   Temp 97.8 F (36.6 C) (Oral)   Ht 5' 5.5" (1.664 m)   Wt 193 lb (87.5 kg)   SpO2 94%   BMI 31.63 kg/m   Wt Readings from Last 3 Encounters:  10/07/18 193 lb (87.5 kg)  08/22/18 197 lb (89.4 kg)  06/29/18 195 lb 9 oz (88.7 kg)    Physical Exam Vitals signs and nursing note reviewed.  Constitutional:      General: She is not in acute distress.    Appearance: Normal appearance. She is not ill-appearing, toxic-appearing or diaphoretic.  HENT:     Head: Normocephalic and atraumatic.     Right Ear: Tympanic membrane, ear canal and external ear normal. There is no impacted cerumen.     Left Ear: Tympanic membrane, ear canal and external ear normal. There is no impacted cerumen.     Nose: Nose normal. No congestion or rhinorrhea.     Mouth/Throat:     Mouth: Mucous membranes are moist.     Pharynx: Oropharynx is clear. No oropharyngeal exudate or posterior oropharyngeal erythema.  Eyes:     General: No scleral icterus.       Right eye: No discharge.        Left eye: No discharge.     Extraocular Movements: Extraocular movements intact.     Conjunctiva/sclera: Conjunctivae normal.     Pupils: Pupils are equal, round, and reactive to light.  Neck:     Musculoskeletal: Normal range of motion and neck supple. No neck rigidity or muscular tenderness.     Vascular: No carotid bruit.  Cardiovascular:     Rate and Rhythm: Normal rate and regular rhythm.     Pulses: Normal pulses.     Heart sounds: No murmur. No friction rub. No gallop.   Pulmonary:     Effort: Pulmonary effort is normal. No  respiratory distress.     Breath sounds: Normal breath sounds. No stridor. No wheezing, rhonchi or rales.  Chest:     Chest wall: No tenderness.    Abdominal:     General: Abdomen is flat. Bowel sounds are normal. There is no distension.     Palpations: Abdomen is soft. There is no mass.     Tenderness: There is no abdominal tenderness. There is no right CVA tenderness, left CVA tenderness, guarding or rebound.     Hernia: No hernia is present.  Genitourinary:      Comments: Breast and pelvic exams deferred with shared decision making Musculoskeletal:        General: No swelling, tenderness, deformity or signs of injury.     Right lower leg: No edema.     Left lower leg: No edema.  Lymphadenopathy:     Cervical: No cervical adenopathy.  Skin:    General: Skin is warm and dry.     Capillary Refill: Capillary refill takes less than 2 seconds.     Coloration: Skin is not jaundiced or pale.     Findings: No bruising, erythema, lesion or rash.  Neurological:     General: No focal deficit present.     Mental Status: She is alert and oriented to person, place, and time. Mental status is at baseline.     Cranial Nerves: No cranial nerve deficit.     Sensory: No sensory deficit.     Motor: No weakness.     Coordination: Coordination normal.     Gait: Gait normal.     Deep Tendon Reflexes: Reflexes normal.     Comments: tremorous voice  Psychiatric:        Mood and Affect: Mood is depressed. Affect is blunt.        Behavior: Behavior normal.        Thought Content: Thought content normal.        Judgment: Judgment normal.     6CIT Screen 10/07/2018 09/09/2017 09/03/2016  What Year? 0 points 0 points 0 points  What month? 0 points 0 points 0 points  What time? 0 points 0 points 0 points  Count back from 20 0 points 0 points 0 points  Months in reverse 0 points 0 points 0 points  Repeat phrase 4 points 0 points 2 points  Total Score 4 0 2     Results for orders placed or performed in visit on 06/29/18  CBC with Differential/Platelet  Result Value Ref Range   WBC 4.5 3.4 - 10.8 x10E3/uL   RBC 3.83 3.77 - 5.28 x10E6/uL    Hemoglobin 11.9 11.1 - 15.9 g/dL   Hematocrit 35.0 34.0 - 46.6 %   MCV 91 79 - 97 fL   MCH 31.1 26.6 - 33.0 pg   MCHC 34.0 31.5 - 35.7 g/dL   RDW 12.5 12.3 - 15.4 %   Platelets 280 150 - 450 x10E3/uL   Neutrophils 41 Not Estab. %   Lymphs 43 Not Estab. %   Monocytes 9 Not Estab. %   Eos 5 Not Estab. %   Basos 2 Not Estab. %   Neutrophils Absolute 1.8 1.4 - 7.0 x10E3/uL   Lymphocytes Absolute 1.9 0.7 - 3.1 x10E3/uL   Monocytes Absolute 0.4 0.1 - 0.9 x10E3/uL   EOS (ABSOLUTE) 0.2 0.0 - 0.4 x10E3/uL   Basophils Absolute 0.1 0.0 - 0.2 x10E3/uL   Immature Granulocytes 0 Not Estab. %   Immature Grans (Abs) 0.0 0.0 -  0.1 x10E3/uL  Comprehensive metabolic panel  Result Value Ref Range   Glucose 105 (H) 65 - 99 mg/dL   BUN 16 8 - 27 mg/dL   Creatinine, Ser 1.10 (H) 0.57 - 1.00 mg/dL   GFR calc non Af Amer 51 (L) >59 mL/min/1.73   GFR calc Af Amer 59 (L) >59 mL/min/1.73   BUN/Creatinine Ratio 15 12 - 28   Sodium 138 134 - 144 mmol/L   Potassium 4.9 3.5 - 5.2 mmol/L   Chloride 101 96 - 106 mmol/L   CO2 22 20 - 29 mmol/L   Calcium 9.8 8.7 - 10.3 mg/dL   Total Protein 7.3 6.0 - 8.5 g/dL   Albumin 4.6 3.6 - 4.8 g/dL   Globulin, Total 2.7 1.5 - 4.5 g/dL   Albumin/Globulin Ratio 1.7 1.2 - 2.2   Bilirubin Total 0.3 0.0 - 1.2 mg/dL   Alkaline Phosphatase 77 39 - 117 IU/L   AST 19 0 - 40 IU/L   ALT 20 0 - 32 IU/L  Iron and TIBC  Result Value Ref Range   Total Iron Binding Capacity 276 250 - 450 ug/dL   UIBC 167 118 - 369 ug/dL   Iron 109 27 - 139 ug/dL   Iron Saturation 39 15 - 55 %  Lipid Panel w/o Chol/HDL Ratio  Result Value Ref Range   Cholesterol, Total 245 (H) 100 - 199 mg/dL   Triglycerides 246 (H) 0 - 149 mg/dL   HDL 42 >39 mg/dL   VLDL Cholesterol Cal 49 (H) 5 - 40 mg/dL   LDL Calculated 154 (H) 0 - 99 mg/dL  Microalbumin, Urine Waived  Result Value Ref Range   Microalb, Ur Waived 30 (H) 0 - 19 mg/L   Creatinine, Urine Waived 50 10 - 300 mg/dL   Microalb/Creat Ratio  30-300 (H) <30 mg/g  TSH  Result Value Ref Range   TSH 1.170 0.450 - 4.500 uIU/mL  UA/M w/rflx Culture, Routine  Result Value Ref Range   Specific Gravity, UA <1.005 (L) 1.005 - 1.030   pH, UA 5.0 5.0 - 7.5   Color, UA Yellow Yellow   Appearance Ur Hazy (A) Clear   Leukocytes, UA Negative Negative   Protein, UA Negative Negative/Trace   Glucose, UA Negative Negative   Ketones, UA Negative Negative   RBC, UA Negative Negative   Bilirubin, UA Negative Negative   Urobilinogen, Ur 0.2 0.2 - 1.0 mg/dL   Nitrite, UA Negative Negative  Hgb A1c w/o eAG  Result Value Ref Range   Hgb A1c MFr Bld 5.9 (H) 4.8 - 5.6 %  Specimen status report  Result Value Ref Range   specimen status report Comment       Assessment & Plan:   Problem List Items Addressed This Visit      Endocrine   Type 2 diabetes mellitus with diabetic autonomic neuropathy, without long-term current use of insulin (HCC)    Stable with A1c of 6.0- continue current regimen. Continue to monitor.       Relevant Orders   [facility] [place] Hb A1c Waived   Ambulatory referral to Ophthalmology    Other Visit Diagnoses    Medicare annual wellness visit, subsequent    -  Primary   Preventative Care discussed today as below   Routine general medical examination at a health care facility       Vaccines up to date. Screening labs checked last visit. Mammogram ordered. Colonoscopy up to date. DEXA up to date. Pap  N/A. Continue diet and exercise.    Upper respiratory tract infection, unspecified type       Rest and fluids. Call if not feeling better.   Lump in the groin       Will obtain US to see what it is. Await results.    Relevant Orders   US Pelvis Limited   Diabetes mellitus without complication (HCC)       Relevant Medications   glucose blood (ONE TOUCH ULTRA TEST) test strip       Preventative Services:  Health Risk Assessment and Personalized Prevention Plan: Done today Bone Mass Measurements: Up to date Breast Cancer  Screening: Scheduled for 10/18/18 CVD Screening: Up to date Cervical Cancer Screening: N/A Colon Cancer Screening: up to date Depression Screening: Done today Diabetes Screening: Done today Glaucoma Screening: See your eye doctor Hepatitis B vaccine: N/A Hepatitis C screening: up to date HIV Screening: up to date Flu Vaccine: up to date Lung cancer Screening: Up to date Obesity Screening: Done today Pneumonia Vaccines (2): Up to date STI Screening: N/A  Follow up plan: Return in about 3 months (around 01/05/2019) for 6 month follow up.   LABORATORY TESTING:  - Pap smear: not applicable  IMMUNIZATIONS:   - Tdap: Tetanus vaccination status reviewed: last tetanus booster within 10 years. - Influenza: Up to date - Pneumovax: Up to date - Prevnar: Up to date - Zostavax vaccine: Up to date  SCREENING: -Mammogram: Up to date  - Colonoscopy: Up to date  - Bone Density: Up to date   PATIENT COUNSELING:   Advised to take 1 mg of folate supplement per day if capable of pregnancy.   Sexuality: Discussed sexually transmitted diseases, partner selection, use of condoms, avoidance of unintended pregnancy  and contraceptive alternatives.   Advised to avoid cigarette smoking.  I discussed with the patient that most people either abstain from alcohol or drink within safe limits (<=14/week and <=4 drinks/occasion for males, <=7/weeks and <= 3 drinks/occasion for females) and that the risk for alcohol disorders and other health effects rises proportionally with the number of drinks per week and how often a drinker exceeds daily limits.  Discussed cessation/primary prevention of drug use and availability of treatment for abuse.   Diet: Encouraged to adjust caloric intake to maintain  or achieve ideal body weight, to reduce intake of dietary saturated fat and total fat, to limit sodium intake by avoiding high sodium foods and not adding table salt, and to maintain adequate dietary potassium and  calcium preferably from fresh fruits, vegetables, and low-fat dairy products.    stressed the importance of regular exercise  Injury prevention: Discussed safety belts, safety helmets, smoke detector, smoking near bedding or upholstery.   Dental health: Discussed importance of regular tooth brushing, flossing, and dental visits.    NEXT PREVENTATIVE PHYSICAL DUE IN 1 YEAR. Return in about 3 months (around 01/05/2019) for 6 month follow up.

## 2018-10-07 ENCOUNTER — Encounter: Payer: Self-pay | Admitting: Family Medicine

## 2018-10-07 ENCOUNTER — Other Ambulatory Visit: Payer: Self-pay

## 2018-10-07 ENCOUNTER — Ambulatory Visit (INDEPENDENT_AMBULATORY_CARE_PROVIDER_SITE_OTHER): Payer: PPO | Admitting: Family Medicine

## 2018-10-07 VITALS — BP 131/83 | HR 95 | Temp 97.8°F | Ht 65.5 in | Wt 193.0 lb

## 2018-10-07 DIAGNOSIS — E1143 Type 2 diabetes mellitus with diabetic autonomic (poly)neuropathy: Secondary | ICD-10-CM | POA: Diagnosis not present

## 2018-10-07 DIAGNOSIS — J069 Acute upper respiratory infection, unspecified: Secondary | ICD-10-CM

## 2018-10-07 DIAGNOSIS — R1909 Other intra-abdominal and pelvic swelling, mass and lump: Secondary | ICD-10-CM

## 2018-10-07 DIAGNOSIS — E119 Type 2 diabetes mellitus without complications: Secondary | ICD-10-CM

## 2018-10-07 DIAGNOSIS — Z0000 Encounter for general adult medical examination without abnormal findings: Secondary | ICD-10-CM

## 2018-10-07 LAB — BAYER DCA HB A1C WAIVED: HB A1C (BAYER DCA - WAIVED): 6 % (ref <7.0)

## 2018-10-07 MED ORDER — SYNTHROID 100 MCG PO TABS: 100.0000 ug | ORAL_TABLET | Freq: Every day | ORAL | 3 refills | Status: AC

## 2018-10-07 MED ORDER — GLUCOSE BLOOD VI STRP: ORAL_STRIP | 12 refills | Status: AC

## 2018-10-07 MED ORDER — SUMATRIPTAN SUCCINATE 100 MG PO TABS: 100.0000 mg | ORAL_TABLET | ORAL | 12 refills | Status: AC | PRN

## 2018-10-07 NOTE — Assessment & Plan Note (Signed)
Stable with A1c of 6.0- continue current regimen. Continue to monitor.

## 2018-10-07 NOTE — Patient Instructions (Addendum)
Preventative Services:  Health Risk Assessment and Personalized Prevention Plan: Done today Bone Mass Measurements: Up to date Breast Cancer Screening: Scheduled for 10/18/18 CVD Screening: Up to date Cervical Cancer Screening: N/A Colon Cancer Screening: up to date Depression Screening: Done today Diabetes Screening: Done today Glaucoma Screening: See your eye doctor Hepatitis B vaccine: N/A Hepatitis C screening: up to date HIV Screening: up to date Flu Vaccine: up to date Lung cancer Screening: Up to date Obesity Screening: Done today Pneumonia Vaccines (2): Up to date STI Screening: N/A     Health Maintenance for Postmenopausal Women Menopause is a normal process in which your reproductive ability comes to an end. This process happens gradually over a span of months to years, usually between the ages of 9 and 76. Menopause is complete when you have missed 12 consecutive menstrual periods. It is important to talk with your health care provider about some of the most common conditions that affect postmenopausal women, such as heart disease, cancer, and bone loss (osteoporosis). Adopting a healthy lifestyle and getting preventive care can help to promote your health and wellness. Those actions can also lower your chances of developing some of these common conditions. What should I know about menopause? During menopause, you may experience a number of symptoms, such as:  Moderate-to-severe hot flashes.  Night sweats.  Decrease in sex drive.  Mood swings.  Headaches.  Tiredness.  Irritability.  Memory problems.  Insomnia. Choosing to treat or not to treat menopausal changes is an individual decision that you make with your health care provider. What should I know about hormone replacement therapy and supplements? Hormone therapy products are effective for treating symptoms that are associated with menopause, such as hot flashes and night sweats. Hormone replacement  carries certain risks, especially as you become older. If you are thinking about using estrogen or estrogen with progestin treatments, discuss the benefits and risks with your health care provider. What should I know about heart disease and stroke? Heart disease, heart attack, and stroke become more likely as you age. This may be due, in part, to the hormonal changes that your body experiences during menopause. These can affect how your body processes dietary fats, triglycerides, and cholesterol. Heart attack and stroke are both medical emergencies. There are many things that you can do to help prevent heart disease and stroke:  Have your blood pressure checked at least every 1-2 years. High blood pressure causes heart disease and increases the risk of stroke.  If you are 44-70 years old, ask your health care provider if you should take aspirin to prevent a heart attack or a stroke.  Do not use any tobacco products, including cigarettes, chewing tobacco, or electronic cigarettes. If you need help quitting, ask your health care provider.  It is important to eat a healthy diet and maintain a healthy weight. ? Be sure to include plenty of vegetables, fruits, low-fat dairy products, and lean protein. ? Avoid eating foods that are high in solid fats, added sugars, or salt (sodium).  Get regular exercise. This is one of the most important things that you can do for your health. ? Try to exercise for at least 150 minutes each week. The type of exercise that you do should increase your heart rate and make you sweat. This is known as moderate-intensity exercise. ? Try to do strengthening exercises at least twice each week. Do these in addition to the moderate-intensity exercise.  Know your numbers.Ask your health care provider to  check your cholesterol and your blood glucose. Continue to have your blood tested as directed by your health care provider.  What should I know about cancer screening? There  are several types of cancer. Take the following steps to reduce your risk and to catch any cancer development as early as possible. Breast Cancer  Practice breast self-awareness. ? This means understanding how your breasts normally appear and feel. ? It also means doing regular breast self-exams. Let your health care provider know about any changes, no matter how small.  If you are 25 or older, have a clinician do a breast exam (clinical breast exam or CBE) every year. Depending on your age, family history, and medical history, it may be recommended that you also have a yearly breast X-ray (mammogram).  If you have a family history of breast cancer, talk with your health care provider about genetic screening.  If you are at high risk for breast cancer, talk with your health care provider about having an MRI and a mammogram every year.  Breast cancer (BRCA) gene test is recommended for women who have family members with BRCA-related cancers. Results of the assessment will determine the need for genetic counseling and BRCA1 and for BRCA2 testing. BRCA-related cancers include these types: ? Breast. This occurs in males or females. ? Ovarian. ? Tubal. This may also be called fallopian tube cancer. ? Cancer of the abdominal or pelvic lining (peritoneal cancer). ? Prostate. ? Pancreatic. Cervical, Uterine, and Ovarian Cancer Your health care provider may recommend that you be screened regularly for cancer of the pelvic organs. These include your ovaries, uterus, and vagina. This screening involves a pelvic exam, which includes checking for microscopic changes to the surface of your cervix (Pap test).  For women ages 21-65, health care providers may recommend a pelvic exam and a Pap test every three years. For women ages 35-65, they may recommend the Pap test and pelvic exam, combined with testing for human papilloma virus (HPV), every five years. Some types of HPV increase your risk of cervical  cancer. Testing for HPV may also be done on women of any age who have unclear Pap test results.  Other health care providers may not recommend any screening for nonpregnant women who are considered low risk for pelvic cancer and have no symptoms. Ask your health care provider if a screening pelvic exam is right for you.  If you have had past treatment for cervical cancer or a condition that could lead to cancer, you need Pap tests and screening for cancer for at least 20 years after your treatment. If Pap tests have been discontinued for you, your risk factors (such as having a new sexual partner) need to be reassessed to determine if you should start having screenings again. Some women have medical problems that increase the chance of getting cervical cancer. In these cases, your health care provider may recommend that you have screening and Pap tests more often.  If you have a family history of uterine cancer or ovarian cancer, talk with your health care provider about genetic screening.  If you have vaginal bleeding after reaching menopause, tell your health care provider.  There are currently no reliable tests available to screen for ovarian cancer. Lung Cancer Lung cancer screening is recommended for adults 20-42 years old who are at high risk for lung cancer because of a history of smoking. A yearly low-dose CT scan of the lungs is recommended if you:  Currently smoke.  Have  a history of at least 30 pack-years of smoking and you currently smoke or have quit within the past 15 years. A pack-year is smoking an average of one pack of cigarettes per day for one year. Yearly screening should:  Continue until it has been 15 years since you quit.  Stop if you develop a health problem that would prevent you from having lung cancer treatment. Colorectal Cancer  This type of cancer can be detected and can often be prevented.  Routine colorectal cancer screening usually begins at age 36 and  continues through age 21.  If you have risk factors for colon cancer, your health care provider may recommend that you be screened at an earlier age.  If you have a family history of colorectal cancer, talk with your health care provider about genetic screening.  Your health care provider may also recommend using home test kits to check for hidden blood in your stool.  A small camera at the end of a tube can be used to examine your colon directly (sigmoidoscopy or colonoscopy). This is done to check for the earliest forms of colorectal cancer.  Direct examination of the colon should be repeated every 5-10 years until age 20. However, if early forms of precancerous polyps or small growths are found or if you have a family history or genetic risk for colorectal cancer, you may need to be screened more often. Skin Cancer  Check your skin from head to toe regularly.  Monitor any moles. Be sure to tell your health care provider: ? About any new moles or changes in moles, especially if there is a change in a mole's shape or color. ? If you have a mole that is larger than the size of a pencil eraser.  If any of your family members has a history of skin cancer, especially at a young age, talk with your health care provider about genetic screening.  Always use sunscreen. Apply sunscreen liberally and repeatedly throughout the day.  Whenever you are outside, protect yourself by wearing long sleeves, pants, a wide-brimmed hat, and sunglasses. What should I know about osteoporosis? Osteoporosis is a condition in which bone destruction happens more quickly than new bone creation. After menopause, you may be at an increased risk for osteoporosis. To help prevent osteoporosis or the bone fractures that can happen because of osteoporosis, the following is recommended:  If you are 36-35 years old, get at least 1,000 mg of calcium and at least 600 mg of vitamin D per day.  If you are older than age 16 but  younger than age 59, get at least 1,200 mg of calcium and at least 600 mg of vitamin D per day.  If you are older than age 62, get at least 1,200 mg of calcium and at least 800 mg of vitamin D per day. Smoking and excessive alcohol intake increase the risk of osteoporosis. Eat foods that are rich in calcium and vitamin D, and do weight-bearing exercises several times each week as directed by your health care provider. What should I know about how menopause affects my mental health? Depression may occur at any age, but it is more common as you become older. Common symptoms of depression include:  Low or sad mood.  Changes in sleep patterns.  Changes in appetite or eating patterns.  Feeling an overall lack of motivation or enjoyment of activities that you previously enjoyed.  Frequent crying spells. Talk with your health care provider if you think that  you are experiencing depression. What should I know about immunizations? It is important that you get and maintain your immunizations. These include:  Tetanus, diphtheria, and pertussis (Tdap) booster vaccine.  Influenza every year before the flu season begins.  Pneumonia vaccine.  Shingles vaccine. Your health care provider may also recommend other immunizations. This information is not intended to replace advice given to you by your health care provider. Make sure you discuss any questions you have with your health care provider. Document Released: 10/16/2005 Document Revised: 03/13/2016 Document Reviewed: 05/28/2015 Elsevier Interactive Patient Education  2019 [employer].

## 2018-10-12 ENCOUNTER — Other Ambulatory Visit: Payer: Self-pay | Admitting: Family Medicine

## 2018-10-12 DIAGNOSIS — R1909 Other intra-abdominal and pelvic swelling, mass and lump: Secondary | ICD-10-CM

## 2018-10-13 ENCOUNTER — Ambulatory Visit: Payer: PPO

## 2018-10-13 ENCOUNTER — Ambulatory Visit
Admission: RE | Admit: 2018-10-13 | Discharge: 2018-10-13 | Disposition: A | Discharge status: Home / Self Care | Payer: PPO | Source: Ambulatory Visit | Attending: Family Medicine | Admitting: Family Medicine

## 2018-10-13 DIAGNOSIS — R599 Enlarged lymph nodes, unspecified: Secondary | ICD-10-CM | POA: Diagnosis not present

## 2018-10-13 DIAGNOSIS — R1909 Other intra-abdominal and pelvic swelling, mass and lump: Secondary | ICD-10-CM | POA: Insufficient documentation

## 2018-10-14 ENCOUNTER — Telehealth: Payer: Self-pay | Admitting: Family Medicine

## 2018-10-14 MED ORDER — AMOXICILLIN 875 MG PO TABS: 875.0000 mg | ORAL_TABLET | Freq: Two times a day (BID) | ORAL | 0 refills | Status: DC

## 2018-10-14 NOTE — Telephone Encounter (Signed)
Message relayed to patient. Verbalized understanding and denied questions.   

## 2018-10-14 NOTE — Telephone Encounter (Signed)
Please let Leslie Olsen know that her [place] just showed some lymph nodes. I'm going to send her through an antibiotic, but it's nothing to worry about.

## 2018-10-18 ENCOUNTER — Encounter: Payer: Self-pay | Admitting: Family Medicine

## 2018-10-18 ENCOUNTER — Telehealth: Payer: Self-pay | Admitting: Neurology

## 2018-10-18 ENCOUNTER — Ambulatory Visit
Admission: RE | Admit: 2018-10-18 | Discharge: 2018-10-18 | Disposition: A | Discharge status: Home / Self Care | Payer: PPO | Source: Ambulatory Visit | Attending: Family Medicine | Admitting: Family Medicine

## 2018-10-18 DIAGNOSIS — Z1231 Encounter for screening mammogram for malignant neoplasm of breast: Secondary | ICD-10-CM | POA: Insufficient documentation

## 2018-10-18 NOTE — Telephone Encounter (Signed)
We have attempted to call the patient 2 times to schedule sleep study. Patient has been unavailable at the phone numbers we have on file and has not returned our calls. At this point we will send a letter asking pt to please contact the sleep lab to schedule their sleep study. If patient calls back we will schedule them for their sleep study. ° °

## 2018-10-24 ENCOUNTER — Ambulatory Visit: Admit: 2018-10-24 | Discharge: 2018-10-25 | Payer: PRIVATE HEALTH INSURANCE | Attending: Neurology | Primary: Neurology

## 2018-10-24 DIAGNOSIS — G253 Myoclonus: Principal | ICD-10-CM

## 2018-10-24 DIAGNOSIS — R066 Hiccough: Secondary | ICD-10-CM

## 2018-10-24 DIAGNOSIS — R5382 Chronic fatigue, unspecified: Secondary | ICD-10-CM

## 2018-10-24 DIAGNOSIS — Z6831 Body mass index (BMI) 31.0-31.9, adult: Secondary | ICD-10-CM | POA: Diagnosis not present

## 2018-12-28 MED ORDER — CLONAZEPAM 0.5 MG TABLET
ORAL_TABLET | 0 refills | 0 days | Status: CP
Start: 2018-12-28 — End: 2019-03-28

## 2019-01-05 ENCOUNTER — Ambulatory Visit: Payer: PPO | Admitting: Family Medicine

## 2019-01-06 ENCOUNTER — Ambulatory Visit (INDEPENDENT_AMBULATORY_CARE_PROVIDER_SITE_OTHER): Payer: PPO | Admitting: Family Medicine

## 2019-01-06 ENCOUNTER — Encounter: Payer: Self-pay | Admitting: Family Medicine

## 2019-01-06 ENCOUNTER — Other Ambulatory Visit: Payer: Self-pay

## 2019-01-06 VITALS — BP 134/84 | HR 91 | Temp 98.2°F | Ht 65.5 in | Wt 191.0 lb

## 2019-01-06 DIAGNOSIS — N3001 Acute cystitis with hematuria: Secondary | ICD-10-CM | POA: Diagnosis not present

## 2019-01-06 DIAGNOSIS — E782 Mixed hyperlipidemia: Secondary | ICD-10-CM

## 2019-01-06 DIAGNOSIS — J383 Other diseases of vocal cords: Secondary | ICD-10-CM | POA: Diagnosis not present

## 2019-01-06 DIAGNOSIS — E039 Hypothyroidism, unspecified: Secondary | ICD-10-CM | POA: Diagnosis not present

## 2019-01-06 DIAGNOSIS — R300 Dysuria: Secondary | ICD-10-CM | POA: Diagnosis not present

## 2019-01-06 DIAGNOSIS — D509 Iron deficiency anemia, unspecified: Secondary | ICD-10-CM

## 2019-01-06 DIAGNOSIS — E1143 Type 2 diabetes mellitus with diabetic autonomic (poly)neuropathy: Secondary | ICD-10-CM

## 2019-01-06 DIAGNOSIS — I10 Essential (primary) hypertension: Secondary | ICD-10-CM | POA: Diagnosis not present

## 2019-01-06 DIAGNOSIS — F0631 Mood disorder due to known physiological condition with depressive features: Secondary | ICD-10-CM | POA: Diagnosis not present

## 2019-01-06 LAB — BAYER DCA HB A1C WAIVED: HB A1C (BAYER DCA - WAIVED): 6.1 % (ref <7.0)

## 2019-01-06 MED ORDER — METFORMIN HCL 500 MG PO TABS: 500.0000 mg | ORAL_TABLET | Freq: Every day | ORAL | 1 refills | Status: DC

## 2019-01-06 MED ORDER — NITROFURANTOIN MONOHYD MACRO 100 MG PO CAPS: 100.0000 mg | ORAL_CAPSULE | Freq: Two times a day (BID) | ORAL | 0 refills | Status: DC

## 2019-01-06 MED ORDER — LISINOPRIL 5 MG PO TABS: 5.0000 mg | ORAL_TABLET | Freq: Every day | ORAL | 1 refills | Status: DC

## 2019-01-06 NOTE — Progress Notes (Signed)
BP 134/84   Pulse 91   Temp 98.2 F (36.8 C) (Oral)   Ht 5' 5.5" (1.664 m)   Wt 191 lb (86.6 kg)   SpO2 98%   BMI 31.30 kg/m    Subjective:    Patient ID: Leslie Olsen, female    DOB: 28-Nov-1950, 68 y.o.   MRN: [id number]  HPI: Leslie Olsen is a 68 y.o. female  Chief Complaint  Patient presents with  . Diabetes    5m f/u   . Hypertension  . Hyperlipidemia  . Hypothyroidism   Chairty is very upset today. She notes that her spasms are worse than usual. She notes that she is frustrated and sick of all of it. She does not feel like her keppra is doing anything. She does not feel like her clonazepam is doing anything. She is very upset about her last appointment with the neurologist where she was suggested to talk to someone about her frustration. She doesn't want to talk to anyone. She doesn't want to get a second opinion. She is angry and frustrated today.  DIABETES Hypoglycemic episodes:no Polydipsia/polyuria: no Visual disturbance: no Chest pain: no Paresthesias: no Glucose Monitoring: no Taking Insulin?: no Blood Pressure Monitoring: not checking Retinal Examination: Up to Date Foot Exam: Up to Date Diabetic Education: Completed Pneumovax: Up to Date Influenza: Up to Date Aspirin: yes  ANEMIA- has been feeling very tired. Wondering if her iron is off again. Anemia status: unknown Etiology of anemia: iron deficiency Duration of anemia treatment: chronic Compliance with treatment: excellent compliance Iron supplementation side effects: no Severity of anemia: moderate Fatigue: yes Decreased exercise tolerance: yes  Dyspnea on exertion: yes Palpitations: yes Bleeding: no Pica: no  HYPERTENSION / HYPERLIPIDEMIA Satisfied with current treatment? yes Duration of hypertension: chronic BP monitoring frequency: not checking BP medication side effects: no Past BP meds: lisinopril Duration of hyperlipidemia: chronic Cholesterol medication side effects: not  on anything Cholesterol supplements: none Medication compliance: good compliance Aspirin: yes Recent stressors: yes Recurrent headaches: no Visual changes: no Palpitations: no Dyspnea: no Chest pain: no Lower extremity edema: no Dizzy/lightheaded: no  HYPOTHYROIDISM Thyroid control status:stable Satisfied with current treatment? yes Medication side effects: no Medication compliance: good compliance Recent dose adjustment:no Fatigue: yes Cold intolerance: no Heat intolerance: no Weight gain: no Weight loss: no Constipation: no Diarrhea/loose stools: no Palpitations: no Lower extremity edema: no Anxiety/depressed mood: no   URINARY SYMPTOMS Duration: weeks Dysuria: yes Urinary frequency: yes Urgency: yes Small volume voids: yes Symptom severity: moderate Urinary incontinence: yes Foul odor: yes Hematuria: no Abdominal pain: yes Back pain: no Suprapubic pain/pressure: yes Flank pain: yes Fever:  no Vomiting: no Relief with cranberry juice: no Relief with pyridium: no Status:worse Previous urinary tract infection: yes Recurrent urinary tract infection: no Treatments attempted: antibiotics and increasing fluids    Relevant past medical, surgical, family and social history reviewed and updated as indicated. Interim medical history since our last visit reviewed. Allergies and medications reviewed and updated.  Review of Systems  Constitutional: Negative.   HENT: Negative.   Respiratory: Negative.   Cardiovascular: Negative.   Gastrointestinal: Positive for abdominal pain. Negative for abdominal distention, anal bleeding, blood in stool, constipation, diarrhea, nausea, rectal pain and vomiting.  Genitourinary: Positive for dysuria, frequency and urgency. Negative for decreased urine volume, difficulty urinating, dyspareunia, enuresis, flank pain, genital sores, hematuria, menstrual problem, pelvic pain, vaginal bleeding, vaginal discharge and vaginal pain.   Musculoskeletal: Negative.   Skin: Negative.   Neurological:  Negative.   Psychiatric/Behavioral: Positive for dysphoric mood. Negative for agitation, behavioral problems, confusion, decreased concentration, hallucinations, self-injury, sleep disturbance and suicidal ideas. The patient is nervous/anxious. The patient is not hyperactive.     Per HPI unless specifically indicated above     Objective:    BP 134/84   Pulse 91   Temp 98.2 F (36.8 C) (Oral)   Ht 5' 5.5" (1.664 m)   Wt 191 lb (86.6 kg)   SpO2 98%   BMI 31.30 kg/m   Wt Readings from Last 3 Encounters:  01/06/19 191 lb (86.6 kg)  10/07/18 193 lb (87.5 kg)  08/22/18 197 lb (89.4 kg)    Physical Exam Vitals signs and nursing note reviewed.  Constitutional:      General: She is not in acute distress.    Appearance: Normal appearance. She is not ill-appearing, toxic-appearing or diaphoretic.  HENT:     Head: Normocephalic and atraumatic.     Right Ear: External ear normal.     Left Ear: External ear normal.     Nose: Nose normal.     Mouth/Throat:     Mouth: Mucous membranes are moist.     Pharynx: Oropharynx is clear.  Eyes:     General: No scleral icterus.       Right eye: No discharge.        Left eye: No discharge.     Extraocular Movements: Extraocular movements intact.     Conjunctiva/sclera: Conjunctivae normal.     Pupils: Pupils are equal, round, and reactive to light.  Neck:     Musculoskeletal: Normal range of motion and neck supple.  Cardiovascular:     Rate and Rhythm: Normal rate and regular rhythm.     Pulses: Normal pulses.     Heart sounds: Normal heart sounds. No murmur. No friction rub. No gallop.   Pulmonary:     Effort: Pulmonary effort is normal. No respiratory distress.     Breath sounds: Normal breath sounds. No stridor. No wheezing, rhonchi or rales.  Chest:     Chest wall: No tenderness.  Musculoskeletal: Normal range of motion.  Skin:    General: Skin is warm and dry.      Capillary Refill: Capillary refill takes less than 2 seconds.     Coloration: Skin is not jaundiced or pale.     Findings: No bruising, erythema, lesion or rash.  Neurological:     General: No focal deficit present.     Mental Status: She is alert and oriented to person, place, and time. Mental status is at baseline.  Psychiatric:        Mood and Affect: Mood normal. Affect is angry.        Behavior: Behavior normal.        Thought Content: Thought content normal.        Judgment: Judgment normal.     Results for orders placed or performed in visit on 01/06/19  Microscopic Examination  Result Value Ref Range   WBC, UA >30 (A) 0 - 5 /hpf   RBC 0-2 0 - 2 /hpf   Epithelial Cells (non renal) 0-10 0 - 10 /hpf   Bacteria, UA Many (A) None seen/Few  Urine Culture, Reflex  Result Value Ref Range   Urine Culture, Routine Final report (A)    Organism ID, Bacteria Proteus mirabilis (A)    Antimicrobial Susceptibility Comment   Bayer DCA Hb A1c Waived  Result Value Ref Range   HB  A1C (BAYER DCA - WAIVED) 6.1 <7.0 %  TSH  Result Value Ref Range   TSH 0.650 0.450 - 4.500 uIU/mL  CBC with Differential/Platelet  Result Value Ref Range   WBC 7.2 3.4 - 10.8 x10E3/uL   RBC 4.21 3.77 - 5.28 x10E6/uL   Hemoglobin 13.8 11.1 - 15.9 g/dL   Hematocrit 40.1 34.0 - 46.6 %   MCV 95 79 - 97 fL   MCH 32.8 26.6 - 33.0 pg   MCHC 34.4 31.5 - 35.7 g/dL   RDW 12.0 11.7 - 15.4 %   Platelets 281 150 - 450 x10E3/uL   Neutrophils 53 Not Estab. %   Lymphs 34 Not Estab. %   Monocytes 8 Not Estab. %   Eos 4 Not Estab. %   Basos 1 Not Estab. %   Neutrophils Absolute 3.9 1.4 - 7.0 x10E3/uL   Lymphocytes Absolute 2.4 0.7 - 3.1 x10E3/uL   Monocytes Absolute 0.5 0.1 - 0.9 x10E3/uL   EOS (ABSOLUTE) 0.3 0.0 - 0.4 x10E3/uL   Basophils Absolute 0.1 0.0 - 0.2 x10E3/uL   Immature Granulocytes 0 Not Estab. %   Immature Grans (Abs) 0.0 0.0 - 0.1 x10E3/uL  Comprehensive metabolic panel  Result Value Ref Range    Glucose 109 (H) 65 - 99 mg/dL   BUN 18 8 - 27 mg/dL   Creatinine, Ser 1.14 (H) 0.57 - 1.00 mg/dL   GFR calc non Af Amer 49 (L) >59 mL/min/1.73   GFR calc Af Amer 57 (L) >59 mL/min/1.73   BUN/Creatinine Ratio 16 12 - 28   Sodium 137 134 - 144 mmol/L   Potassium 4.8 3.5 - 5.2 mmol/L   Chloride 97 96 - 106 mmol/L   CO2 24 20 - 29 mmol/L   Calcium 10.1 8.7 - 10.3 mg/dL   Total Protein 7.9 6.0 - 8.5 g/dL   Albumin 4.8 3.8 - 4.8 g/dL   Globulin, Total 3.1 1.5 - 4.5 g/dL   Albumin/Globulin Ratio 1.5 1.2 - 2.2   Bilirubin Total 0.3 0.0 - 1.2 mg/dL   Alkaline Phosphatase 99 39 - 117 IU/L   AST 22 0 - 40 IU/L   ALT 25 0 - 32 IU/L  Lipid Panel w/o Chol/HDL Ratio  Result Value Ref Range   Cholesterol, Total 266 (H) 100 - 199 mg/dL   Triglycerides 194 (H) 0 - 149 mg/dL   HDL 43 >39 mg/dL   VLDL Cholesterol Cal 39 5 - 40 mg/dL   LDL Calculated 184 (H) 0 - 99 mg/dL  Microalbumin, Urine Waived  Result Value Ref Range   Microalb, Ur Waived 80 (H) 0 - 19 mg/L   Creatinine, Urine Waived 10 10 - 300 mg/dL   Microalb/Creat Ratio >300 (H) <30 mg/g  Iron and TIBC  Result Value Ref Range   Total Iron Binding Capacity 288 250 - 450 ug/dL   UIBC 200 118 - 369 ug/dL   Iron 88 27 - 139 ug/dL   Iron Saturation 31 15 - 55 %  Ferritin  Result Value Ref Range   Ferritin 74 15 - 150 ng/mL  UA/M w/rflx Culture, Routine  Result Value Ref Range   Specific Gravity, UA 1.010 1.005 - 1.030   pH, UA 7.0 5.0 - 7.5   Color, UA Yellow Yellow   Appearance Ur Cloudy (A) Clear   Leukocytes,UA 3+ (A) Negative   Protein,UA Trace (A) Negative/Trace   Glucose, UA Negative Negative   Ketones, UA Negative Negative   RBC, UA 1+ (A)  Negative   Bilirubin, UA Negative Negative   Urobilinogen, Ur 0.2 0.2 - 1.0 mg/dL   Nitrite, UA Positive (A) Negative   Microscopic Examination See below:    Urinalysis Reflex Comment       Assessment & Plan:   Problem List Items Addressed This Visit      Cardiovascular and  Mediastinum   Essential hypertension, benign    Under good control on current regimen. Continue current regimen. Continue to monitor. Call with any concerns. Refills given. Labs drawn today. Await results.        Relevant Medications   lisinopril (ZESTRIL) 5 MG tablet   Other Relevant Orders   Comprehensive metabolic panel (Completed)   Microalbumin, Urine Waived (Completed)     Endocrine   Thyroid activity decreased    Rechecking levels today. Await results. Treat as needed.       Relevant Orders   TSH (Completed)   Comprehensive metabolic panel (Completed)   Type 2 diabetes mellitus with diabetic autonomic neuropathy, without long-term current use of insulin ([place]) - Primary    Under good control on current regimen. Continue current regimen. Continue to monitor. Call with any concerns. Refills given. A1c 6.1 today.        Relevant Medications   lisinopril (ZESTRIL) 5 MG tablet   metFORMIN (GLUCOPHAGE) 500 MG tablet   Other Relevant Orders   Bayer DCA Hb A1c Waived (Completed)   Comprehensive metabolic panel (Completed)   Microalbumin, Urine Waived (Completed)     Nervous and Auditory   Dysphonia spastica    Very frustrated. Does not want to see neurology any more. Does not want to talk to anyone. Will consider coming off her medicine.       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 Does not want to talk to counselor. Does not want any medication. Continue to monitor.         Other   Hyperlipidemia    Under good control on current regimen. Continue current regimen. Continue to monitor. Call with any concerns. Refills given. Labs drawn today. Await results.       Relevant Medications   lisinopril (ZESTRIL) 5 MG tablet   Other Relevant Orders   Comprehensive metabolic panel (Completed)   Lipid Panel w/o Chol/HDL Ratio (Completed)   Iron deficiency anemia    Under good control on current  regimen. Continue current regimen. Continue to monitor. Call with any concerns. Refills given. Labs drawn today. Await results.       Relevant Orders   CBC with Differential/Platelet (Completed)   Comprehensive metabolic panel (Completed)   Iron and TIBC (Completed)   Ferritin (Completed)    Other Visit Diagnoses    Dysuria       +UTI   Relevant Orders   UA/M w/rflx Culture, Routine (Completed)   Acute cystitis with hematuria       Will treat. Await culture.        Follow up plan: Return in about 3 months (around 04/08/2019).

## 2019-01-07 LAB — CBC WITH DIFFERENTIAL/PLATELET
Basophils Absolute: 0.1 10*3/uL (ref 0.0–0.2)
Basos: 1 %
EOS (ABSOLUTE): 0.3 10*3/uL (ref 0.0–0.4)
Eos: 4 %
Hematocrit: 40.1 % (ref 34.0–46.6)
Hemoglobin: 13.8 g/dL (ref 11.1–15.9)
Immature Grans (Abs): 0 10*3/uL (ref 0.0–0.1)
Immature Granulocytes: 0 %
Lymphocytes Absolute: 2.4 10*3/uL (ref 0.7–3.1)
Lymphs: 34 %
MCH: 32.8 pg (ref 26.6–33.0)
MCHC: 34.4 g/dL (ref 31.5–35.7)
MCV: 95 fL (ref 79–97)
Monocytes Absolute: 0.5 10*3/uL (ref 0.1–0.9)
Monocytes: 8 %
Neutrophils Absolute: 3.9 10*3/uL (ref 1.4–7.0)
Neutrophils: 53 %
Platelets: 281 10*3/uL (ref 150–450)
RBC: 4.21 x10E6/uL (ref 3.77–5.28)
RDW: 12 % (ref 11.7–15.4)
WBC: 7.2 10*3/uL (ref 3.4–10.8)

## 2019-01-07 LAB — COMPREHENSIVE METABOLIC PANEL
ALT: 25 IU/L (ref 0–32)
AST: 22 IU/L (ref 0–40)
Albumin/Globulin Ratio: 1.5 (ref 1.2–2.2)
Albumin: 4.8 g/dL (ref 3.8–4.8)
Alkaline Phosphatase: 99 IU/L (ref 39–117)
BUN/Creatinine Ratio: 16 (ref 12–28)
BUN: 18 mg/dL (ref 8–27)
Bilirubin Total: 0.3 mg/dL (ref 0.0–1.2)
CO2: 24 mmol/L (ref 20–29)
Calcium: 10.1 mg/dL (ref 8.7–10.3)
Chloride: 97 mmol/L (ref 96–106)
Creatinine, Ser: 1.14 mg/dL — ABNORMAL HIGH (ref 0.57–1.00)
GFR calc Af Amer: 57 mL/min/{1.73_m2} — ABNORMAL LOW (ref >59)
GFR calc non Af Amer: 49 mL/min/{1.73_m2} — ABNORMAL LOW (ref >59)
Globulin, Total: 3.1 g/dL (ref 1.5–4.5)
Glucose: 109 mg/dL — ABNORMAL HIGH (ref 65–99)
Potassium: 4.8 mmol/L (ref 3.5–5.2)
Sodium: 137 mmol/L (ref 134–144)
Total Protein: 7.9 g/dL (ref 6.0–8.5)

## 2019-01-07 LAB — IRON AND TIBC
Iron Saturation: 31 % (ref 15–55)
Iron: 88 ug/dL (ref 27–139)
Total Iron Binding Capacity: 288 ug/dL (ref 250–450)
UIBC: 200 ug/dL (ref 118–369)

## 2019-01-07 LAB — FERRITIN: Ferritin: 74 ng/mL (ref 15–150)

## 2019-01-07 LAB — LIPID PANEL W/O CHOL/HDL RATIO
Cholesterol, Total: 266 mg/dL — ABNORMAL HIGH (ref 100–199)
HDL: 43 mg/dL (ref >39)
LDL Calculated: 184 mg/dL — ABNORMAL HIGH (ref 0–99)
Triglycerides: 194 mg/dL — ABNORMAL HIGH (ref 0–149)
VLDL Cholesterol Cal: 39 mg/dL (ref 5–40)

## 2019-01-07 LAB — TSH: TSH: 0.65 u[IU]/mL (ref 0.450–4.500)

## 2019-01-09 ENCOUNTER — Telehealth: Payer: Self-pay | Admitting: Family Medicine

## 2019-01-09 LAB — UA/M W/RFLX CULTURE, ROUTINE
Bilirubin, UA: NEGATIVE
Glucose, UA: NEGATIVE
Ketones, UA: NEGATIVE
Nitrite, UA: POSITIVE — AB
Specific Gravity, UA: 1.01 (ref 1.005–1.030)
Urobilinogen, Ur: 0.2 mg/dL (ref 0.2–1.0)
pH, UA: 7 (ref 5.0–7.5)

## 2019-01-09 LAB — MICROSCOPIC EXAMINATION: WBC, UA: >30 /hpf — AB (ref 0–5)

## 2019-01-09 LAB — MICROALBUMIN, URINE WAIVED
Creatinine, Urine Waived: 10 mg/dL (ref 10–300)
Microalb, Ur Waived: 80 mg/L — ABNORMAL HIGH (ref 0–19)
Microalb/Creat Ratio: >300 mg/g — ABNORMAL HIGH (ref <30)

## 2019-01-09 LAB — URINE CULTURE, REFLEX

## 2019-01-09 MED ORDER — AMOXICILLIN 875 MG PO TABS: 875.0000 mg | ORAL_TABLET | Freq: Two times a day (BID) | ORAL | 0 refills | Status: DC

## 2019-01-09 NOTE — Telephone Encounter (Signed)
Pt notified. Pt requested labs be mailed to her. Printed labs and mailed to patient.

## 2019-01-09 NOTE — Telephone Encounter (Signed)
Please let her know that her labs look good, but the bacteria in her urine is resistant to the antibiotic we gave her. She should stop the nitrofurnatoin and start the amoxicillin I sent over. Thanks.

## 2019-01-12 ENCOUNTER — Encounter: Payer: Self-pay | Admitting: Family Medicine

## 2019-01-12 DIAGNOSIS — F0631 Mood disorder due to known physiological condition with depressive features: Secondary | ICD-10-CM | POA: Insufficient documentation

## 2019-01-12 NOTE — Assessment & Plan Note (Signed)
Under good control on current regimen. Continue current regimen. Continue to monitor. Call with any concerns. Refills given. A1c 6.1 today.

## 2019-01-12 NOTE — Assessment & Plan Note (Signed)
Under good control on current regimen. Continue current regimen. Continue to monitor. Call with any concerns. Refills given. Labs drawn today. Await results.  

## 2019-01-12 NOTE — Assessment & Plan Note (Signed)
Rechecking levels today. Await results. Treat as needed.  

## 2019-01-12 NOTE — Assessment & Plan Note (Signed)
Very frustrated. Does not want to see neurology any more. Does not want to talk to anyone. Will consider coming off her medicine.

## 2019-01-12 NOTE — Assessment & Plan Note (Signed)
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 Does not want to talk to counselor. Does not want any medication. Continue to monitor.

## 2019-01-17 DIAGNOSIS — D509 Iron deficiency anemia, unspecified: Secondary | ICD-10-CM | POA: Diagnosis not present

## 2019-01-17 DIAGNOSIS — I10 Essential (primary) hypertension: Secondary | ICD-10-CM | POA: Diagnosis not present

## 2019-01-17 DIAGNOSIS — E785 Hyperlipidemia, unspecified: Secondary | ICD-10-CM | POA: Diagnosis not present

## 2019-01-17 DIAGNOSIS — E119 Type 2 diabetes mellitus without complications: Secondary | ICD-10-CM | POA: Diagnosis not present

## 2019-01-17 DIAGNOSIS — J449 Chronic obstructive pulmonary disease, unspecified: Secondary | ICD-10-CM | POA: Diagnosis not present

## 2019-01-17 DIAGNOSIS — Z0001 Encounter for general adult medical examination with abnormal findings: Secondary | ICD-10-CM | POA: Diagnosis not present

## 2019-01-17 DIAGNOSIS — N390 Urinary tract infection, site not specified: Secondary | ICD-10-CM | POA: Diagnosis not present

## 2019-01-17 DIAGNOSIS — G4733 Obstructive sleep apnea (adult) (pediatric): Secondary | ICD-10-CM | POA: Diagnosis not present

## 2019-01-17 DIAGNOSIS — R499 Unspecified voice and resonance disorder: Secondary | ICD-10-CM | POA: Diagnosis not present

## 2019-01-17 DIAGNOSIS — E039 Hypothyroidism, unspecified: Secondary | ICD-10-CM | POA: Diagnosis not present

## 2019-01-17 DIAGNOSIS — K580 Irritable bowel syndrome with diarrhea: Secondary | ICD-10-CM | POA: Diagnosis not present

## 2019-01-23 DIAGNOSIS — R399 Unspecified symptoms and signs involving the genitourinary system: Secondary | ICD-10-CM | POA: Diagnosis not present

## 2019-02-27 ENCOUNTER — Ambulatory Visit: Payer: PPO | Admitting: Urology

## 2019-02-27 ENCOUNTER — Encounter: Payer: Self-pay | Admitting: Urology

## 2019-02-27 ENCOUNTER — Other Ambulatory Visit: Payer: Self-pay

## 2019-02-27 VITALS — BP 126/81 | HR 80 | Ht 66.0 in | Wt 191.0 lb

## 2019-02-27 DIAGNOSIS — N390 Urinary tract infection, site not specified: Secondary | ICD-10-CM | POA: Diagnosis not present

## 2019-02-27 LAB — URINALYSIS, COMPLETE
Bilirubin, UA: NEGATIVE
Glucose, UA: NEGATIVE
Ketones, UA: NEGATIVE
Leukocytes,UA: NEGATIVE
Nitrite, UA: NEGATIVE
Protein,UA: NEGATIVE
RBC, UA: NEGATIVE
Specific Gravity, UA: 1.015 (ref 1.005–1.030)
Urobilinogen, Ur: 0.2 mg/dL (ref 0.2–1.0)
pH, UA: 5 (ref 5.0–7.5)

## 2019-02-27 LAB — BLADDER SCAN AMB NON-IMAGING

## 2019-02-27 LAB — MICROSCOPIC EXAMINATION
Bacteria, UA: NONE SEEN
RBC, Urine: NONE SEEN /hpf (ref 0–2)

## 2019-02-27 MED ORDER — OXYBUTYNIN CHLORIDE ER 10 MG PO TB24: 10.0000 mg | ORAL_TABLET | Freq: Every day | ORAL | 11 refills | Status: DC

## 2019-02-27 NOTE — Patient Instructions (Addendum)
1. Take cranberry tablets twice daily to prevent UTI 2. Minimize fluids before bed, and pee twice before going to sleep    Urinary Tract Infection, Adult A urinary tract infection (UTI) is an infection of any part of the urinary tract. The urinary tract includes:  The kidneys.  The ureters.  The bladder.  The urethra. These organs make, store, and get rid of pee (urine) in the body. What are the causes? This is caused by germs (bacteria) in your genital area. These germs grow and cause swelling (inflammation) of your urinary tract. What increases the risk? You are more likely to develop this condition if:  You have a small, thin tube (catheter) to drain pee.  You cannot control when you pee or poop (incontinence).  You are female, and: ? You use these methods to prevent pregnancy: ? A medicine that kills sperm (spermicide). ? A device that blocks sperm (diaphragm). ? You have low levels of a female hormone (estrogen). ? You are pregnant.  You have genes that add to your risk.  You are sexually active.  You take antibiotic medicines.  You have trouble peeing because of: ? A prostate that is bigger than normal, if you are female. ? A blockage in the part of your body that drains pee from the bladder (urethra). ? A kidney stone. ? A nerve condition that affects your bladder (neurogenic bladder). ? Not getting enough to drink. ? Not peeing often enough.  You have other conditions, such as: ? Diabetes. ? A weak disease-fighting system (immune system). ? Sickle cell disease. ? Gout. ? Injury of the spine. What are the signs or symptoms? Symptoms of this condition include:  Needing to pee right away (urgently).  Peeing often.  Peeing small amounts often.  Pain or burning when peeing.  Blood in the pee.  Pee that smells bad or not like normal.  Trouble peeing.  Pee that is cloudy.  Fluid coming from the vagina, if you are female.  Pain in the belly or  lower back. Other symptoms include:  Throwing up (vomiting).  No urge to eat.  Feeling mixed up (confused).  Being tired and grouchy (irritable).  A fever.  Watery poop (diarrhea). How is this treated? This condition may be treated with:  Antibiotic medicine.  Other medicines.  Drinking enough water. Follow these instructions at home:  Medicines  Take over-the-counter and prescription medicines only as told by your doctor.  If you were prescribed an antibiotic medicine, take it as told by your doctor. Do not stop taking it even if you start to feel better. General instructions  Make sure you: ? Pee until your bladder is empty. ? Do not hold pee for a long time. ? Empty your bladder after sex. ? Wipe from front to back after pooping if you are a female. Use each tissue one time when you wipe.  Drink enough fluid to keep your pee pale yellow.  Keep all follow-up visits as told by your doctor. This is important. Contact a doctor if:  You do not get better after 1-2 days.  Your symptoms go away and then come back. Get help right away if:  You have very bad back pain.  You have very bad pain in your lower belly.  You have a fever.  You are sick to your stomach (nauseous).  You are throwing up. Summary  A urinary tract infection (UTI) is an infection of any part of the urinary tract.  This  condition is caused by germs in your genital area.  There are many risk factors for a UTI. These include having a small, thin tube to drain pee and not being able to control when you pee or poop.  Treatment includes antibiotic medicines for germs.  Drink enough fluid to keep your pee pale yellow. This information is not intended to replace advice given to you by your health care provider. Make sure you discuss any questions you have with your health care provider. Document Released: 02/10/2008 Document Revised: 03/03/2018 Document Reviewed: 03/03/2018 Elsevier  Interactive Patient Education  2019 [employer].

## 2019-02-27 NOTE — Progress Notes (Signed)
02/27/2019 8:57 AM   [name] [birth year] [id number]  Referring provider: Valerie Roys, DO [place],  [place] [id number]  CC: Recurrent UTIs  HPI: I saw Ms. Hessling in urology clinic in consultation for recurrent urinary tract infections from Dr. Wynetta Emery.  She has 2 documented UTIs over the last year, with Proteus UTI on 01/06/2019 with multiple resistances, and E. coli UTI on 01/23/2019 with E. coli.  She has symptoms of pelvic pain, pressure, and dysuria with UTIs.  Her symptoms improved with antibiotics.  She currently denies any dysuria or pelvic pain, however she does have moderate to severe nocturia with 5-6 voids per night.  Her symptoms are not as bothersome during the day.  Her past medical history is notable for well-controlled type 2 diabetes, and sleep apnea on CPAP machine.  She has a history of a negative hematuria work-up approximately 20 years ago for microscopic hematuria.  She avoids alcohol, caffeine, soda, and teas secondary to her urinary symptoms.  She occasionally has trouble with constipation and diarrhea, as well as chronic indigestion.  She has an 80-pack-year smoking history and quit in 2013.  She denies any hematuria or flank pain.  She reportedly had a CT abdomen pelvis in the last year at [facility], however these results are unavailable to me.  She had a CT from 2015 that I personally reviewed which shows no nephrolithiasis or hydronephrosis.  There are no aggravating or alleviating factors.  PVR in clinic 16 cc, urinalysis benign.   PMH: Past Medical History:  Diagnosis Date  . Anemia   . Cephalalgia 04/08/2015  . Chronic headaches   . COPD (chronic obstructive pulmonary disease) ([place])   . Diabetes mellitus without complication ([place])   . Fatigue due to excessive exertion 08/22/2018  . GERD (gastroesophageal reflux disease)   . Hypertension   . Hypothyroidism   . [facility] spotted fever) 07/27/2016    Surgical History: Past  Surgical History:  Procedure Laterality Date  . ABDOMINAL HYSTERECTOMY    . BACK SURGERY    . CHOLECYSTECTOMY    . CORONARY STENT PLACEMENT    . ESOPHAGOGASTRODUODENOSCOPY (EGD) WITH PROPOFOL N/A 05/26/2016   Procedure: ESOPHAGOGASTRODUODENOSCOPY (EGD) WITH PROPOFOL;  Surgeon: Lucilla Lame, MD;  Location: [facility] ENDOSCOPY;  Service: Endoscopy;  Laterality: N/A;  . GIVENS CAPSULE STUDY N/A 05/05/2016   Procedure: GIVENS CAPSULE STUDY;  Surgeon: Lucilla Lame, MD;  Location: [facility] ENDOSCOPY;  Service: Endoscopy;  Laterality: N/A;  . REDUCTION MAMMAPLASTY    . REPLACEMENT TOTAL KNEE    . SHOULDER SURGERY    . TIBIA FRACTURE SURGERY      Allergies: No Known Allergies  Family History: Family History  Problem Relation Age of Onset  . Lung cancer Maternal Aunt   . Breast cancer Maternal Aunt 57  . Other Mother        Killed in car accident, 64  . Other Father        Killed in car accident, 15  . Thyroid disease Brother   . Thyroid disease Sister     Social History:  reports that she quit smoking about 7 years ago. Her smoking use included cigarettes. She has a 90.00 pack-year smoking history. She has never used smokeless tobacco. She reports that she does not drink alcohol or use drugs.  ROS: Please see flowsheet from today's date for complete review of systems.  Physical Exam: There were no vitals taken for this visit.   Constitutional:  Alert and oriented, No acute distress. Cardiovascular: No clubbing, cyanosis, or edema. Respiratory: Normal respiratory effort, no increased work of breathing. GI: Abdomen is soft, nontender, nondistended, no abdominal masses GU: No CVA tenderness, vaginal mucosa pink and healthy-appearing, no urethral lesions. Lymph: No cervical or inguinal lymphadenopathy. Skin: No rashes, bruises or suspicious lesions. Neurologic: Grossly intact, no focal deficits, moving all 4 extremities. Psychiatric: Normal mood and affect.  Laboratory Data: Urinalysis today  0 WBCs, 0 RBCs, no bacteria, no yeast, nitrite negative  Pertinent Imaging: Unable to personally view, or see results from CT abdomen pelvis from [facility] in the last year, will obtain.  Assessment & Plan:   In summary, the patient is a 68 year old female with 2 urinary tract infections over the last 6 months, as well as significant bothersome nocturia every 1-2 hours overnight.  We discussed the evaluation and treatment of patients with recurrent UTIs at length.  We specifically discussed the differences between asymptomatic bacteriuria and true urinary tract infection.  We discussed the AUA definition of recurrent UTI of at least 2 culture proven symptomatic acute cystitis episodes in a 55-month period, or 3 within a 1 year period.  We discussed the importance of culture directed antibiotic treatment, and antibiotic stewardship.  First-line therapy includes nitrofurantoin(5 days), Bactrim(3 days), or fosfomycin(3 g single dose).  Possible etiologies of recurrent infection include periurethral tissue atrophy in postmenopausal woman, constipation, sexual activity, incomplete emptying, anatomic abnormalities, and even genetic predisposition.  Finally, we discussed the role of perineal hygiene, timed voiding, adequate hydration, topical vaginal estrogen, cranberry prophylaxis, and low-dose antibiotic prophylaxis.  -Trial of cranberry prophylaxis for UTI, will avoid suppressive antibiotics at this time with her history of resistant UTIs -Trial of oxybutynin XL for OAB symptoms -Obtain CT abdomen pelvis from [place] 6 weeks virtual visit for symptom check   Billey Co, [place] [address] [place], [place] [address] [phone]

## 2019-03-01 ENCOUNTER — Telehealth: Payer: Self-pay

## 2019-03-13 DIAGNOSIS — R8290 Unspecified abnormal findings in urine: Secondary | ICD-10-CM | POA: Diagnosis not present

## 2019-03-13 DIAGNOSIS — I10 Essential (primary) hypertension: Secondary | ICD-10-CM | POA: Diagnosis not present

## 2019-03-13 DIAGNOSIS — E039 Hypothyroidism, unspecified: Secondary | ICD-10-CM | POA: Diagnosis not present

## 2019-03-15 DIAGNOSIS — E119 Type 2 diabetes mellitus without complications: Secondary | ICD-10-CM | POA: Diagnosis not present

## 2019-03-20 DIAGNOSIS — E039 Hypothyroidism, unspecified: Secondary | ICD-10-CM | POA: Diagnosis not present

## 2019-03-20 DIAGNOSIS — E785 Hyperlipidemia, unspecified: Secondary | ICD-10-CM | POA: Diagnosis not present

## 2019-03-20 DIAGNOSIS — I10 Essential (primary) hypertension: Secondary | ICD-10-CM | POA: Diagnosis not present

## 2019-03-20 DIAGNOSIS — G4733 Obstructive sleep apnea (adult) (pediatric): Secondary | ICD-10-CM | POA: Diagnosis not present

## 2019-03-20 DIAGNOSIS — E119 Type 2 diabetes mellitus without complications: Secondary | ICD-10-CM | POA: Diagnosis not present

## 2019-03-20 DIAGNOSIS — J449 Chronic obstructive pulmonary disease, unspecified: Secondary | ICD-10-CM | POA: Diagnosis not present

## 2019-03-20 DIAGNOSIS — N289 Disorder of kidney and ureter, unspecified: Secondary | ICD-10-CM | POA: Diagnosis not present

## 2019-03-20 DIAGNOSIS — Z0000 Encounter for general adult medical examination without abnormal findings: Secondary | ICD-10-CM | POA: Diagnosis not present

## 2019-03-28 MED ORDER — CLONAZEPAM 0.5 MG TABLET
ORAL_TABLET | 0 refills | 0 days | Status: CP
Start: 2019-03-28 — End: ?

## 2019-04-04 ENCOUNTER — Telehealth: Payer: Self-pay

## 2019-04-14 ENCOUNTER — Ambulatory Visit: Payer: PPO | Admitting: Family Medicine

## 2019-04-18 ENCOUNTER — Telehealth: Payer: PPO | Admitting: Urology

## 2019-05-02 ENCOUNTER — Telehealth (INDEPENDENT_AMBULATORY_CARE_PROVIDER_SITE_OTHER): Payer: PPO | Admitting: Urology

## 2019-05-02 ENCOUNTER — Other Ambulatory Visit: Payer: Self-pay | Admitting: Urology

## 2019-05-02 DIAGNOSIS — N390 Urinary tract infection, site not specified: Secondary | ICD-10-CM

## 2019-05-02 DIAGNOSIS — N3281 Overactive bladder: Secondary | ICD-10-CM | POA: Diagnosis not present

## 2019-05-02 MED ORDER — OXYBUTYNIN CHLORIDE ER 15 MG PO TB24: 15.0000 mg | ORAL_TABLET | Freq: Every day | ORAL | 11 refills | Status: DC

## 2019-05-03 ENCOUNTER — Telehealth: Payer: Self-pay | Admitting: Urology

## 2019-05-03 ENCOUNTER — Other Ambulatory Visit: Payer: Self-pay

## 2019-05-03 NOTE — Progress Notes (Signed)
Virtual Visit via Telephone Note  I connected with Leslie Olsen on 05/03/19 at  1:15 PM EDT by telephone and verified that I am speaking with the correct person using two identifiers.   I discussed the limitations, risks, security and privacy concerns of performing an evaluation and management service by telephone and the availability of in person appointments. We discussed the impact of the COVID-19 pandemic on the healthcare system, and the importance of social distancing and reducing patient and provider exposure. I also discussed with the patient that there may be a patient responsible charge related to this service. The patient expressed understanding and agreed to proceed.  Reason for visit: Recurrent UTIs, OAB  History of Present Illness: I had a phone conversation with Leslie Olsen today for follow-up.  She is a patient whose had recurrent UTIs as well as OAB symptoms that I first saw back in June 2020.  With her multiple resistant UTIs, we elected to trial cranberry prophylaxis and behavioral strategies and avoid suppressive antibiotics.  We also tried oxybutynin 10 mg XL for her OAB symptoms.  She reports her OAB symptoms have improved moderately on the oxybutynin XL, but she is still having some occasional urge incontinence during the day.  She is interested in trying a higher dose.  She denies any UTIs since our last visit.  Oxybutynin XL increased to 15 mg Continue cranberry prophylaxis  Follow Up: RTC 2 months for symptom check, consider PTNS at that time if persistent OAB symptoms    I discussed the assessment and treatment plan with the patient. The patient was provided an opportunity to ask questions and all were answered. The patient agreed with the plan and demonstrated an understanding of the instructions.   The patient was advised to call back or seek an in-person evaluation if the symptoms worsen or if the condition fails to improve as anticipated.  I provided 15  minutes of non-face-to-face time during this encounter.   Billey Co, MD

## 2019-05-03 NOTE — Telephone Encounter (Signed)
Scheduled 8 wk f/u and mailed to pt.

## 2019-05-23 ENCOUNTER — Telehealth: Payer: Self-pay | Admitting: *Deleted

## 2019-05-23 NOTE — Telephone Encounter (Signed)
Left message for patient to notify them that it is time to schedule annual low dose lung cancer screening CT scan. Instructed patient to call back to verify information prior to the scan being scheduled. CB# [phone] 

## 2019-05-25 ENCOUNTER — Telehealth: Payer: Self-pay | Admitting: *Deleted

## 2019-05-25 DIAGNOSIS — Z122 Encounter for screening for malignant neoplasm of respiratory organs: Secondary | ICD-10-CM

## 2019-05-25 DIAGNOSIS — Z87891 Personal history of nicotine dependence: Secondary | ICD-10-CM

## 2019-05-25 NOTE — Telephone Encounter (Signed)
Patient has been notified that annual lung cancer screening low dose CT scan is due currently or will be in near future. Confirmed that patient is within the age range of 55-77, and asymptomatic, (no signs or symptoms of lung cancer). Patient denies illness that would prevent curative treatment for lung cancer if found. Verified smoking history, (former, quit 2013, 90 pack year). The shared decision making visit was done 11/03/16. Patient is agreeable for CT scan being scheduled.

## 2019-05-30 ENCOUNTER — Other Ambulatory Visit: Payer: Self-pay

## 2019-05-30 ENCOUNTER — Ambulatory Visit
Admission: RE | Admit: 2019-05-30 | Discharge: 2019-05-30 | Disposition: A | Discharge status: Home / Self Care | Payer: PPO | Source: Ambulatory Visit | Attending: Oncology | Admitting: Oncology

## 2019-05-30 DIAGNOSIS — Z122 Encounter for screening for malignant neoplasm of respiratory organs: Secondary | ICD-10-CM | POA: Insufficient documentation

## 2019-05-30 DIAGNOSIS — Z87891 Personal history of nicotine dependence: Secondary | ICD-10-CM | POA: Insufficient documentation

## 2019-05-31 ENCOUNTER — Encounter: Payer: Self-pay | Admitting: *Deleted

## 2019-06-13 DIAGNOSIS — I10 Essential (primary) hypertension: Secondary | ICD-10-CM | POA: Diagnosis not present

## 2019-06-13 DIAGNOSIS — E119 Type 2 diabetes mellitus without complications: Secondary | ICD-10-CM | POA: Diagnosis not present

## 2019-06-20 DIAGNOSIS — E119 Type 2 diabetes mellitus without complications: Secondary | ICD-10-CM | POA: Diagnosis not present

## 2019-06-20 DIAGNOSIS — R300 Dysuria: Secondary | ICD-10-CM | POA: Diagnosis not present

## 2019-06-20 DIAGNOSIS — E785 Hyperlipidemia, unspecified: Secondary | ICD-10-CM | POA: Diagnosis not present

## 2019-06-20 DIAGNOSIS — I2510 Atherosclerotic heart disease of native coronary artery without angina pectoris: Secondary | ICD-10-CM | POA: Diagnosis not present

## 2019-06-20 DIAGNOSIS — E039 Hypothyroidism, unspecified: Secondary | ICD-10-CM | POA: Diagnosis not present

## 2019-06-20 DIAGNOSIS — I10 Essential (primary) hypertension: Secondary | ICD-10-CM | POA: Diagnosis not present

## 2019-06-20 DIAGNOSIS — J449 Chronic obstructive pulmonary disease, unspecified: Secondary | ICD-10-CM | POA: Diagnosis not present

## 2019-06-20 DIAGNOSIS — G4733 Obstructive sleep apnea (adult) (pediatric): Secondary | ICD-10-CM | POA: Diagnosis not present

## 2019-06-21 MED ORDER — CLONAZEPAM 0.5 MG TABLET: 1 mg | tablet | Freq: Two times a day (BID) | 0 refills | 90 days | Status: AC

## 2019-06-27 MED ORDER — LEVETIRACETAM 500 MG TABLET: tablet | 1 refills | 0 days | Status: AC

## 2019-06-28 ENCOUNTER — Ambulatory Visit: Payer: PPO | Admitting: Urology

## 2019-06-30 DIAGNOSIS — Z955 Presence of coronary angioplasty implant and graft: Secondary | ICD-10-CM | POA: Diagnosis not present

## 2019-06-30 DIAGNOSIS — R079 Chest pain, unspecified: Secondary | ICD-10-CM | POA: Diagnosis not present

## 2019-06-30 DIAGNOSIS — R0600 Dyspnea, unspecified: Secondary | ICD-10-CM | POA: Diagnosis not present

## 2019-06-30 DIAGNOSIS — I10 Essential (primary) hypertension: Secondary | ICD-10-CM | POA: Diagnosis not present

## 2019-06-30 DIAGNOSIS — E785 Hyperlipidemia, unspecified: Secondary | ICD-10-CM | POA: Diagnosis not present

## 2019-06-30 DIAGNOSIS — E119 Type 2 diabetes mellitus without complications: Secondary | ICD-10-CM | POA: Diagnosis not present

## 2019-06-30 DIAGNOSIS — J449 Chronic obstructive pulmonary disease, unspecified: Secondary | ICD-10-CM | POA: Diagnosis not present

## 2019-07-05 ENCOUNTER — Other Ambulatory Visit: Payer: Self-pay

## 2019-07-05 ENCOUNTER — Encounter: Payer: Self-pay | Admitting: Urology

## 2019-07-05 ENCOUNTER — Ambulatory Visit: Payer: PPO | Admitting: Urology

## 2019-07-05 VITALS — BP 128/74 | HR 80 | Ht 66.0 in | Wt 190.0 lb

## 2019-07-05 DIAGNOSIS — R102 Pelvic and perineal pain: Secondary | ICD-10-CM | POA: Diagnosis not present

## 2019-07-05 DIAGNOSIS — N3281 Overactive bladder: Secondary | ICD-10-CM

## 2019-07-05 DIAGNOSIS — N390 Urinary tract infection, site not specified: Secondary | ICD-10-CM | POA: Diagnosis not present

## 2019-07-05 NOTE — Patient Instructions (Signed)
Cystoscopy Cystoscopy is a procedure that is used to help diagnose and sometimes treat conditions that affect the lower urinary tract. The lower urinary tract includes the bladder and the urethra. The urethra is the tube that drains urine from the bladder. Cystoscopy is done using a thin, tube-shaped instrument with a light and camera at the end (cystoscope). The cystoscope may be hard or flexible, depending on the goal of the procedure. The cystoscope is inserted through the urethra, into the bladder. Cystoscopy may be recommended if you have:  Urinary tract infections that keep coming back.  Blood in the urine (hematuria).  An inability to control when you urinate (urinary incontinence) or an overactive bladder.  Unusual cells found in a urine sample.  A blockage in the urethra, such as a urinary stone.  Painful urination.  An abnormality in the bladder found during an intravenous pyelogram (IVP) or CT scan. Cystoscopy may also be done to remove a sample of tissue to be examined under a microscope (biopsy). Tell a health care provider about:  Any allergies you have.  All medicines you are taking, including vitamins, herbs, eye drops, creams, and over-the-counter medicines.  Any problems you or family members have had with anesthetic medicines.  Any blood disorders you have.  Any surgeries you have had.  Any medical conditions you have.  Whether you are pregnant or may be pregnant. What are the risks? Generally, this is a safe procedure. However, problems may occur, including:  Infection.  Bleeding.  Allergic reactions to medicines.  Damage to other structures or organs. What happens before the procedure?  Ask your health care provider about: ? Changing or stopping your regular medicines. This is especially important if you are taking diabetes medicines or blood thinners. ? Taking medicines such as aspirin and ibuprofen. These medicines can thin your blood. Do not take  these medicines unless your health care provider tells you to take them. ? Taking over-the-counter medicines, vitamins, herbs, and supplements.  Follow instructions from your health care provider about eating or drinking restrictions.  Ask your health care provider what steps will be taken to help prevent infection. These may include: ? Washing skin with a germ-killing soap. ? Taking antibiotic medicine.  You may have an exam or testing, such as: ? X-rays of the bladder, urethra, or kidneys. ? Urine tests to check for signs of infection.  Plan to have someone take you home from the hospital or clinic. What happens during the procedure?   You will be given one or more of the following: ? A medicine to help you relax (sedative). ? A medicine to numb the area (local anesthetic).  The area around the opening of your urethra will be cleaned.  The cystoscope will be passed through your urethra into your bladder.  Germ-free (sterile) fluid will flow through the cystoscope to fill your bladder. The fluid will stretch your bladder so that your health care provider can clearly examine your bladder walls.  Your doctor will look at the urethra and bladder. Your doctor may take a biopsy or remove stones.  The cystoscope will be removed, and your bladder will be emptied. The procedure may vary among health care providers and hospitals. What can I expect after the procedure? After the procedure, it is common to have:  Some soreness or pain in your abdomen and urethra.  Urinary symptoms. These include: ? Mild pain or burning when you urinate. Pain should stop within a few minutes after you urinate. This   may last for up to 1 week. ? A small amount of blood in your urine for several days. ? Feeling like you need to urinate but producing only a small amount of urine. Follow these instructions at home: Medicines  Take over-the-counter and prescription medicines only as told by your health care  provider.  If you were prescribed an antibiotic medicine, take it as told by your health care provider. Do not stop taking the antibiotic even if you start to feel better. General instructions  Return to your normal activities as told by your health care provider. Ask your health care provider what activities are safe for you.  Do not drive for 24 hours if you were given a sedative during your procedure.  Watch for any blood in your urine. If the amount of blood in your urine increases, call your health care provider.  Follow instructions from your health care provider about eating or drinking restrictions.  If a tissue sample was removed for testing (biopsy) during your procedure, it is up to you to get your test results. Ask your health care provider, or the department that is doing the test, when your results will be ready.  Drink enough fluid to keep your urine pale yellow.  Keep all follow-up visits as told by your health care provider. This is important. Contact a health care provider if you:  Have pain that gets worse or does not get better with medicine, especially pain when you urinate.  Have trouble urinating.  Have more blood in your urine. Get help right away if you:  Have blood clots in your urine.  Have abdominal pain.  Have a fever or chills.  Are unable to urinate. Summary  Cystoscopy is a procedure that is used to help diagnose and sometimes treat conditions that affect the lower urinary tract.  Cystoscopy is done using a thin, tube-shaped instrument with a light and camera at the end.  After the procedure, it is common to have some soreness or pain in your abdomen and urethra.  Watch for any blood in your urine. If the amount of blood in your urine increases, call your health care provider.  If you were prescribed an antibiotic medicine, take it as told by your health care provider. Do not stop taking the antibiotic even if you start to feel better. This  information is not intended to replace advice given to you by your health care provider. Make sure you discuss any questions you have with your health care provider. Document Released: 08/21/2000 Document Revised: 08/16/2018 Document Reviewed: 08/16/2018 Elsevier Patient Education  2020 [employer]  

## 2019-07-05 NOTE — Progress Notes (Addendum)
   07/05/2019 3:38 PM   Leslie Olsen 08/08/1951 [id number]  Reason for visit: Follow up OAB, pelvic pain  HPI: I saw Leslie Olsen back in urology clinic for OAB and history of recurrent UTIs, as well as new pelvic pain.  She is a 68 year old female with an extensive smoking history who I originally saw in June 2020.  She had to UTIs in the last few months at that time, but has not had any further UTIs after starting cranberry prophylaxis.  She also had bothersome urgency and frequency as well as nocturia that improved significantly with oxybutynin 15 mg XL.  Her primary complaint today is intermittent dysuria and pelvic pain that is moderately bothersome.  Her recent urine culture was negative with her PCP.  She had a normal CT abdomen pelvis in the last year at [facility] that was reportedly normal.  She also reportedly had a negative microscopic hematuria work-up in the distant past.  With her history of irritative voiding symptoms, pain, dysuria, negative urine culture I recommended cystoscopy to rule out CIS or bladder tumor.  She is in agreement.  Continue oxybutynin 15 mg XL for OAB symptoms Continue cranberry prophylaxis for UTIs Schedule cystoscopy to rule out CIS as source of irritative symptoms  A total of 25 minutes were spent face-to-face with the patient, greater than 50% was spent in patient education, counseling, and coordination of care regarding OAB, UTIs, and need for cystoscopy.  Leslie Olsen, [facility] [address] [place], [place] [address] [phone]

## 2019-07-20 DIAGNOSIS — R0600 Dyspnea, unspecified: Secondary | ICD-10-CM | POA: Diagnosis not present

## 2019-07-20 DIAGNOSIS — Z955 Presence of coronary angioplasty implant and graft: Secondary | ICD-10-CM | POA: Diagnosis not present

## 2019-07-20 DIAGNOSIS — R079 Chest pain, unspecified: Secondary | ICD-10-CM | POA: Diagnosis not present

## 2019-07-27 ENCOUNTER — Other Ambulatory Visit: Payer: Self-pay

## 2019-07-27 ENCOUNTER — Other Ambulatory Visit
Admission: RE | Admit: 2019-07-27 | Discharge: 2019-07-27 | Disposition: A | Discharge status: Home / Self Care | Payer: PPO | Source: Ambulatory Visit | Attending: Cardiology | Admitting: Cardiology

## 2019-07-27 DIAGNOSIS — Z955 Presence of coronary angioplasty implant and graft: Secondary | ICD-10-CM | POA: Diagnosis not present

## 2019-07-27 DIAGNOSIS — Z01810 Encounter for preprocedural cardiovascular examination: Secondary | ICD-10-CM | POA: Diagnosis not present

## 2019-07-27 DIAGNOSIS — R079 Chest pain, unspecified: Secondary | ICD-10-CM | POA: Diagnosis not present

## 2019-07-27 DIAGNOSIS — Z01818 Encounter for other preprocedural examination: Secondary | ICD-10-CM | POA: Diagnosis not present

## 2019-07-27 DIAGNOSIS — R0600 Dyspnea, unspecified: Secondary | ICD-10-CM | POA: Diagnosis not present

## 2019-07-27 DIAGNOSIS — I10 Essential (primary) hypertension: Secondary | ICD-10-CM | POA: Diagnosis not present

## 2019-07-27 DIAGNOSIS — E119 Type 2 diabetes mellitus without complications: Secondary | ICD-10-CM | POA: Diagnosis not present

## 2019-07-27 DIAGNOSIS — Z01812 Encounter for preprocedural laboratory examination: Secondary | ICD-10-CM | POA: Diagnosis not present

## 2019-07-27 DIAGNOSIS — Z20828 Contact with and (suspected) exposure to other viral communicable diseases: Secondary | ICD-10-CM | POA: Insufficient documentation

## 2019-07-27 DIAGNOSIS — E785 Hyperlipidemia, unspecified: Secondary | ICD-10-CM | POA: Diagnosis not present

## 2019-07-27 DIAGNOSIS — J449 Chronic obstructive pulmonary disease, unspecified: Secondary | ICD-10-CM | POA: Diagnosis not present

## 2019-07-27 LAB — SARS CORONAVIRUS 2 (TAT 6-24 HRS): SARS Coronavirus 2: NEGATIVE

## 2019-07-31 ENCOUNTER — Encounter
Admission: RE | Disposition: A | Discharge status: Home / Self Care | Payer: Self-pay | Source: Home / Self Care | Attending: Cardiology

## 2019-07-31 ENCOUNTER — Observation Stay
Admission: RE | Admit: 2019-07-31 | Discharge: 2019-08-01 | Disposition: A | Discharge status: Home / Self Care | Payer: PPO | Attending: Cardiology | Admitting: Cardiology

## 2019-07-31 ENCOUNTER — Other Ambulatory Visit: Payer: Self-pay

## 2019-07-31 DIAGNOSIS — Z8249 Family history of ischemic heart disease and other diseases of the circulatory system: Secondary | ICD-10-CM | POA: Diagnosis not present

## 2019-07-31 DIAGNOSIS — E039 Hypothyroidism, unspecified: Secondary | ICD-10-CM | POA: Insufficient documentation

## 2019-07-31 DIAGNOSIS — J449 Chronic obstructive pulmonary disease, unspecified: Secondary | ICD-10-CM | POA: Diagnosis not present

## 2019-07-31 DIAGNOSIS — I10 Essential (primary) hypertension: Secondary | ICD-10-CM | POA: Insufficient documentation

## 2019-07-31 DIAGNOSIS — E785 Hyperlipidemia, unspecified: Secondary | ICD-10-CM | POA: Diagnosis not present

## 2019-07-31 DIAGNOSIS — R079 Chest pain, unspecified: Secondary | ICD-10-CM | POA: Insufficient documentation

## 2019-07-31 DIAGNOSIS — Z87891 Personal history of nicotine dependence: Secondary | ICD-10-CM | POA: Insufficient documentation

## 2019-07-31 DIAGNOSIS — Z7982 Long term (current) use of aspirin: Secondary | ICD-10-CM | POA: Insufficient documentation

## 2019-07-31 DIAGNOSIS — R0600 Dyspnea, unspecified: Secondary | ICD-10-CM | POA: Diagnosis not present

## 2019-07-31 DIAGNOSIS — Z955 Presence of coronary angioplasty implant and graft: Secondary | ICD-10-CM | POA: Insufficient documentation

## 2019-07-31 DIAGNOSIS — Z801 Family history of malignant neoplasm of trachea, bronchus and lung: Secondary | ICD-10-CM | POA: Insufficient documentation

## 2019-07-31 DIAGNOSIS — I2510 Atherosclerotic heart disease of native coronary artery without angina pectoris: Secondary | ICD-10-CM | POA: Diagnosis not present

## 2019-07-31 DIAGNOSIS — E119 Type 2 diabetes mellitus without complications: Secondary | ICD-10-CM | POA: Insufficient documentation

## 2019-07-31 DIAGNOSIS — Z7984 Long term (current) use of oral hypoglycemic drugs: Secondary | ICD-10-CM | POA: Insufficient documentation

## 2019-07-31 DIAGNOSIS — Z79899 Other long term (current) drug therapy: Secondary | ICD-10-CM | POA: Insufficient documentation

## 2019-07-31 DIAGNOSIS — I25110 Atherosclerotic heart disease of native coronary artery with unstable angina pectoris: Secondary | ICD-10-CM | POA: Diagnosis not present

## 2019-07-31 DIAGNOSIS — R9430 Abnormal result of cardiovascular function study, unspecified: Secondary | ICD-10-CM

## 2019-07-31 DIAGNOSIS — G4730 Sleep apnea, unspecified: Secondary | ICD-10-CM | POA: Diagnosis not present

## 2019-07-31 HISTORY — PX: CORONARY STENT INTERVENTION: CATH118234

## 2019-07-31 HISTORY — PX: LEFT HEART CATH AND CORONARY ANGIOGRAPHY: CATH118249

## 2019-07-31 LAB — GLUCOSE, CAPILLARY
Glucose-Capillary: 108 mg/dL — ABNORMAL HIGH (ref 70–99)
Glucose-Capillary: 102 mg/dL — ABNORMAL HIGH (ref 70–99)

## 2019-07-31 LAB — POCT ACTIVATED CLOTTING TIME
Activated Clotting Time: 263 seconds
Activated Clotting Time: 279 seconds

## 2019-07-31 SURGERY — LEFT HEART CATH AND CORONARY ANGIOGRAPHY
Anesthesia: Moderate Sedation

## 2019-07-31 MED ORDER — SODIUM CHLORIDE 0.9 % WEIGHT BASED INFUSION
3.0000 mL/kg/h | INTRAVENOUS | Status: AC
  Administered 2019-07-31: 3 mL/kg/h via INTRAVENOUS

## 2019-07-31 MED ORDER — SODIUM CHLORIDE 0.9 % WEIGHT BASED INFUSION: 1.0000 mL/kg/h | INTRAVENOUS | Status: DC

## 2019-07-31 MED ORDER — SODIUM CHLORIDE 0.9% FLUSH: 3.0000 mL | INTRAVENOUS | Status: DC | PRN

## 2019-07-31 MED ORDER — LABETALOL HCL 5 MG/ML IV SOLN: 10.0000 mg | INTRAVENOUS | Status: AC | PRN

## 2019-07-31 MED ORDER — FENTANYL CITRATE (PF) 100 MCG/2ML IJ SOLN
INTRAMUSCULAR | Status: DC | PRN
  Administered 2019-07-31 (×2): 50 ug via INTRAVENOUS

## 2019-07-31 MED ORDER — CLONAZEPAM 0.5 MG PO TABS: 0.5000 mg | ORAL_TABLET | Freq: Three times a day (TID) | ORAL | Status: DC | PRN

## 2019-07-31 MED ORDER — VERAPAMIL HCL 2.5 MG/ML IV SOLN
INTRAVENOUS | Status: DC | PRN
  Administered 2019-07-31 (×2): 2.5 mg via INTRA_ARTERIAL

## 2019-07-31 MED ORDER — LEVOTHYROXINE SODIUM 100 MCG PO TABS
100.0000 ug | ORAL_TABLET | Freq: Every day | ORAL | Status: DC
  Administered 2019-08-01: 100 ug via ORAL
  Filled 2019-07-31: qty 1

## 2019-07-31 MED ORDER — HEPARIN SODIUM (PORCINE) 1000 UNIT/ML IJ SOLN
INTRAMUSCULAR | Status: AC
  Filled 2019-07-31: qty 1

## 2019-07-31 MED ORDER — SODIUM CHLORIDE 0.9% FLUSH
3.0000 mL | Freq: Two times a day (BID) | INTRAVENOUS | Status: DC
  Administered 2019-08-01: 3 mL via INTRAVENOUS

## 2019-07-31 MED ORDER — HEPARIN (PORCINE) IN NACL 1000-0.9 UT/500ML-% IV SOLN
INTRAVENOUS | Status: DC | PRN
  Administered 2019-07-31: 500 mL

## 2019-07-31 MED ORDER — ASPIRIN 81 MG PO CHEW
81.0000 mg | CHEWABLE_TABLET | ORAL | Status: AC
  Administered 2019-07-31: 81 mg via ORAL

## 2019-07-31 MED ORDER — LEVETIRACETAM 500 MG PO TABS
500.0000 mg | ORAL_TABLET | Freq: Two times a day (BID) | ORAL | Status: DC
  Administered 2019-07-31 – 2019-08-01 (×2): 500 mg via ORAL
  Filled 2019-07-31 (×2): qty 1

## 2019-07-31 MED ORDER — PRASUGREL HCL 10 MG PO TABS
ORAL_TABLET | ORAL | Status: AC
  Filled 2019-07-31: qty 6

## 2019-07-31 MED ORDER — ASPIRIN 81 MG PO CHEW
CHEWABLE_TABLET | ORAL | Status: AC
  Filled 2019-07-31: qty 1

## 2019-07-31 MED ORDER — PANTOPRAZOLE SODIUM 40 MG PO TBEC
40.0000 mg | DELAYED_RELEASE_TABLET | Freq: Every day | ORAL | Status: DC
  Administered 2019-07-31 – 2019-08-01 (×2): 40 mg via ORAL
  Filled 2019-07-31 (×2): qty 1

## 2019-07-31 MED ORDER — HEPARIN (PORCINE) IN NACL 1000-0.9 UT/500ML-% IV SOLN
INTRAVENOUS | Status: AC
  Filled 2019-07-31: qty 1000

## 2019-07-31 MED ORDER — PRASUGREL HCL 10 MG PO TABS: 10.0000 mg | ORAL_TABLET | Freq: Every day | ORAL | Status: DC

## 2019-07-31 MED ORDER — ACETAMINOPHEN 325 MG PO TABS
650.0000 mg | ORAL_TABLET | ORAL | Status: DC | PRN
  Administered 2019-08-01: 650 mg via ORAL
  Filled 2019-07-31: qty 2

## 2019-07-31 MED ORDER — ASPIRIN 81 MG PO CHEW
CHEWABLE_TABLET | ORAL | Status: AC
  Filled 2019-07-31: qty 3

## 2019-07-31 MED ORDER — ONDANSETRON HCL 4 MG/2ML IJ SOLN: 4.0000 mg | Freq: Four times a day (QID) | INTRAMUSCULAR | Status: DC | PRN

## 2019-07-31 MED ORDER — SUMATRIPTAN SUCCINATE 50 MG PO TABS
25.0000 mg | ORAL_TABLET | ORAL | Status: DC | PRN
  Filled 2019-07-31: qty 1

## 2019-07-31 MED ORDER — SODIUM CHLORIDE 0.9 % IV SOLN: 250.0000 mL | INTRAVENOUS | Status: DC | PRN

## 2019-07-31 MED ORDER — PRASUGREL HCL 10 MG PO TABS
ORAL_TABLET | ORAL | Status: DC | PRN
  Administered 2019-07-31: 60 mg via ORAL

## 2019-07-31 MED ORDER — ASPIRIN 81 MG PO CHEW
81.0000 mg | CHEWABLE_TABLET | Freq: Every day | ORAL | Status: DC
  Administered 2019-08-01: 81 mg via ORAL
  Filled 2019-07-31: qty 1

## 2019-07-31 MED ORDER — FENTANYL CITRATE (PF) 100 MCG/2ML IJ SOLN
INTRAMUSCULAR | Status: AC
  Filled 2019-07-31: qty 2

## 2019-07-31 MED ORDER — ASPIRIN 81 MG PO CHEW
CHEWABLE_TABLET | ORAL | Status: DC | PRN
  Administered 2019-07-31: 243 mg via ORAL

## 2019-07-31 MED ORDER — SODIUM CHLORIDE 0.9% FLUSH
3.0000 mL | Freq: Two times a day (BID) | INTRAVENOUS | Status: DC
  Administered 2019-07-31: 3 mL via INTRAVENOUS

## 2019-07-31 MED ORDER — PRASUGREL HCL 10 MG PO TABS
10.0000 mg | ORAL_TABLET | Freq: Every day | ORAL | Status: DC
  Administered 2019-08-01: 10 mg via ORAL
  Filled 2019-07-31: qty 1

## 2019-07-31 MED ORDER — MIDAZOLAM HCL 2 MG/2ML IJ SOLN
INTRAMUSCULAR | Status: DC | PRN
  Administered 2019-07-31: 1 mg via INTRAVENOUS

## 2019-07-31 MED ORDER — SODIUM CHLORIDE 0.9 % WEIGHT BASED INFUSION: 1.0000 mL/kg/h | INTRAVENOUS | Status: AC

## 2019-07-31 MED ORDER — HYDRALAZINE HCL 20 MG/ML IJ SOLN: 10.0000 mg | INTRAMUSCULAR | Status: AC | PRN

## 2019-07-31 MED ORDER — VERAPAMIL HCL 2.5 MG/ML IV SOLN
INTRAVENOUS | Status: AC
  Filled 2019-07-31: qty 2

## 2019-07-31 MED ORDER — FERROUS GLUCONATE 324 (38 FE) MG PO TABS
324.0000 mg | ORAL_TABLET | Freq: Every day | ORAL | Status: DC
  Administered 2019-08-01: 324 mg via ORAL
  Filled 2019-07-31: qty 1

## 2019-07-31 MED ORDER — ALBUTEROL SULFATE (2.5 MG/3ML) 0.083% IN NEBU: 3.0000 mL | INHALATION_SOLUTION | Freq: Four times a day (QID) | RESPIRATORY_TRACT | Status: DC | PRN

## 2019-07-31 MED ORDER — HEPARIN SODIUM (PORCINE) 1000 UNIT/ML IJ SOLN
INTRAMUSCULAR | Status: DC | PRN
  Administered 2019-07-31: 3000 [IU] via INTRAVENOUS
  Administered 2019-07-31: 4000 [IU] via INTRAVENOUS
  Administered 2019-07-31: 6000 [IU] via INTRAVENOUS

## 2019-07-31 MED ORDER — IOHEXOL 300 MG/ML  SOLN
INTRAMUSCULAR | Status: DC | PRN
  Administered 2019-07-31: 170 mL

## 2019-07-31 MED ORDER — LISINOPRIL 10 MG PO TABS
10.0000 mg | ORAL_TABLET | Freq: Every day | ORAL | Status: DC
  Administered 2019-08-01: 10 mg via ORAL
  Filled 2019-07-31 (×2): qty 1

## 2019-07-31 MED ORDER — MIDAZOLAM HCL 2 MG/2ML IJ SOLN
INTRAMUSCULAR | Status: AC
  Filled 2019-07-31: qty 2

## 2019-07-31 SURGICAL SUPPLY — 15 items
BALLN TREK RX 2.5X12 (BALLOONS) ×4
BALLOON TREK RX 2.5X12 (BALLOONS) IMPLANT
CATH 5F 110X4 TIG (CATHETERS) ×2 IMPLANT
CATH INFINITI 5FR ANG PIGTAIL (CATHETERS) IMPLANT
CATH VISTA GUIDE 6FR JR4 (CATHETERS) ×2 IMPLANT
CATH VISTA GUIDE 6FR JR4 SH (CATHETERS) ×2 IMPLANT
DEVICE INFLAT 30 PLUS (MISCELLANEOUS) ×2 IMPLANT
DEVICE RAD TR BAND REGULAR (VASCULAR PRODUCTS) ×2 IMPLANT
GLIDESHEATH SLEND SS 6F .021 (SHEATH) ×2 IMPLANT
KIT MANI 3VAL PERCEP (MISCELLANEOUS) ×4 IMPLANT
PACK CARDIAC CATH (CUSTOM PROCEDURE TRAY) ×4 IMPLANT
STENT RESOLUTE ONYX 2.75X15 (Permanent Stent) ×2 IMPLANT
WIRE ASAHI PROWATER 180CM (WIRE) ×2 IMPLANT
WIRE HITORQ VERSACORE ST 145CM (WIRE) ×2 IMPLANT
WIRE ROSEN-J .035X260CM (WIRE) ×2 IMPLANT

## 2019-08-01 ENCOUNTER — Encounter: Payer: Self-pay | Admitting: Cardiology

## 2019-08-01 DIAGNOSIS — I2510 Atherosclerotic heart disease of native coronary artery without angina pectoris: Secondary | ICD-10-CM | POA: Diagnosis not present

## 2019-08-01 DIAGNOSIS — I25110 Atherosclerotic heart disease of native coronary artery with unstable angina pectoris: Secondary | ICD-10-CM | POA: Diagnosis not present

## 2019-08-01 LAB — BASIC METABOLIC PANEL
Anion gap: 10 (ref 5–15)
BUN: 21 mg/dL (ref 8–23)
CO2: 24 mmol/L (ref 22–32)
Calcium: 9.3 mg/dL (ref 8.9–10.3)
Chloride: 106 mmol/L (ref 98–111)
Creatinine, Ser: 1.05 mg/dL — ABNORMAL HIGH (ref 0.44–1.00)
GFR calc Af Amer: >60 mL/min (ref >60)
GFR calc non Af Amer: 54 mL/min — ABNORMAL LOW (ref >60)
Glucose, Bld: 111 mg/dL — ABNORMAL HIGH (ref 70–99)
Potassium: 4.3 mmol/L (ref 3.5–5.1)
Sodium: 140 mmol/L (ref 135–145)

## 2019-08-01 LAB — CBC
HCT: 33.8 % — ABNORMAL LOW (ref 36.0–46.0)
Hemoglobin: 11.4 g/dL — ABNORMAL LOW (ref 12.0–15.0)
MCH: 31.6 pg (ref 26.0–34.0)
MCHC: 33.7 g/dL (ref 30.0–36.0)
MCV: 93.6 fL (ref 80.0–100.0)
Platelets: 216 10*3/uL (ref 150–400)
RBC: 3.61 MIL/uL — ABNORMAL LOW (ref 3.87–5.11)
RDW: 13 % (ref 11.5–15.5)
WBC: 6 10*3/uL (ref 4.0–10.5)
nRBC: 0 % (ref 0.0–0.2)

## 2019-08-01 MED ORDER — PRASUGREL HCL 10 MG PO TABS: 10.0000 mg | ORAL_TABLET | Freq: Every day | ORAL | 5 refills | Status: DC

## 2019-08-01 NOTE — Discharge Summary (Signed)
Physician Discharge Summary  Patient ID: TWYLAH NUDING MRN: [id number] DOB/AGE: Oct 27, 1950 68 y.o.  Admit date: 07/31/2019 Discharge date: 08/01/2019  Primary Discharge Diagnosis unstable angina Secondary Discharge Diagnosis coronary artery disease  Significant Diagnostic Studies: yes  Consults: None  Hospital Course: The patient underwent elective cardiac catheterization on 07/31/2019.  Coronary arteriography revealed 90% stenosis just distal to this teen coronary stent.  The patient underwent percutaneous coronary intervention, receiving a 2.75 x 15 mm Resolute Onyx drug-eluting stent with an excellent angiographic result.  Left ventriculography revealed normal left ventricular function.  The patient had an uncomplicated hospital stay without chest pain.  On the morning of 08/01/2019 the patient was ambulating without difficulty was discharged home in stable condition.   Discharge Exam: Blood pressure (!) 117/93, pulse 71, temperature 97.9 F (36.6 C), temperature source Oral, resp. rate 17, height 5\' 6"  (1.676 m), weight 86 kg, SpO2 98 %.   General appearance: alert Head: Normocephalic, without obvious abnormality, atraumatic Eyes: conjunctivae/corneas clear. PERRL, EOM's intact. Fundi benign. Ears: normal TM's and external ear canals both ears Nose: Nares normal. Septum midline. Mucosa normal. No drainage or sinus tenderness. Throat: lips, mucosa, and tongue normal; teeth and gums normal Neck: no adenopathy, no carotid bruit, no JVD, supple, symmetrical, trachea midline and thyroid not enlarged, symmetric, no tenderness/mass/nodules Back: symmetric, no curvature. ROM normal. No CVA tenderness. Resp: clear to auscultation bilaterally Chest wall: no tenderness Cardio: regular rate and rhythm, S1, S2 normal, no murmur, click, rub or gallop GI: soft, non-tender; bowel sounds normal; no masses,  no organomegaly Extremities: extremities normal, atraumatic, no cyanosis or  edema Pulses: 2+ and symmetric Skin: Skin color, texture, turgor normal. No rashes or lesions Lymph nodes: Cervical, supraclavicular, and axillary nodes normal. Neurologic: Grossly normal Labs:   Lab Results  Component Value Date   WBC 6.0 08/01/2019   HGB 11.4 (L) 08/01/2019   HCT 33.8 (L) 08/01/2019   MCV 93.6 08/01/2019   PLT 216 08/01/2019    Recent Labs  Lab 08/01/19 0517  NA 140  K 4.3  CL 106  CO2 24  BUN 21  CREATININE 1.05*  CALCIUM 9.3  GLUCOSE 111*      Radiology:  EKG: Normal sinus rhythm  FOLLOW UP PLANS AND APPOINTMENTS Discharge Instructions    AMB Referral to Cardiac Rehabilitation - Phase II   Complete by: As directed    Diagnosis: Coronary Stents   After initial evaluation and assessments completed: Virtual Based Care may be provided alone or in conjunction with Phase 2 Cardiac Rehab based on patient barriers.: Yes     Allergies as of 08/01/2019   No Known Allergies     Medication List    TAKE these medications   aspirin 81 MG tablet Take 81 mg by mouth daily.   clonazePAM 0.5 MG tablet Commonly known as: KLONOPIN Take 0.5 mg by mouth at bedtime.   ferrous gluconate 324 MG tablet Commonly known as: FERGON Take 324 mg by mouth daily.   glucose blood test strip Commonly known as: ONE TOUCH ULTRA TEST USE ONE STRIP TO CHECK GLUCOSE TWICE DAILY   levETIRAcetam 500 MG tablet Commonly known as: KEPPRA Take 500 mg by mouth 2 (two) times daily.   lisinopril 5 MG tablet Commonly known as: ZESTRIL Take 1 tablet (5 mg total) by mouth daily. What changed: how much to take   metFORMIN 500 MG tablet Commonly known as: GLUCOPHAGE Take 1 tablet (500 mg total) by mouth daily with breakfast.  What changed:   how much to take  when to take this   nitrofurantoin (macrocrystal-monohydrate) 100 MG capsule Commonly known as: Macrobid Take 1 capsule (100 mg total) by mouth 2 (two) times daily.   oxybutynin 15 MG 24 hr tablet Commonly known  as: DITROPAN XL Take 1 tablet (15 mg total) by mouth daily. What changed: when to take this   pantoprazole 40 MG tablet Commonly known as: PROTONIX Take 40 mg by mouth daily as needed (Heartburn).   prasugrel 10 MG Tabs tablet Commonly known as: EFFIENT Take 1 tablet (10 mg total) by mouth daily.   SUMAtriptan 100 MG tablet Commonly known as: IMITREX Take 1 tablet (100 mg total) by mouth every 2 (two) hours as needed for migraine. May repeat in 2 hours if headache persists or recurs.   Synthroid 100 MCG tablet Generic drug: levothyroxine Take 1 tablet (100 mcg total) by mouth daily before breakfast.      Follow-up Information    Terius Jacuinde, MD Follow up in 1 week(s).   Specialty: Cardiology Contact information: [facility] [place] [address] [phone]           BRING ALL MEDICATIONS WITH YOU TO FOLLOW UP APPOINTMENTS  Time spent with patient to include physician time: 25 minutes Signed:  Isaias Cowman MD, PhD, [facility] 08/01/2019, 7:55 AM

## 2019-08-01 NOTE — Progress Notes (Signed)
[name] to be D/C'd Home per MD order. Patient given discharge teaching and paperwork regarding medications, diet, follow-up appointments and activity. Patient understanding verbalized. No questions or complaints at this time. Skin condition as charted. IV and telemetry removed prior to leaving.  No further needs by Care Management/Social Work. Prescription given to patient.  An After Visit Summary was printed and given to the patient.   Patient escorted via wheelchair.  Terrilyn Saver

## 2019-08-01 NOTE — Progress Notes (Signed)
Cardiovascular and Pulmonary Nurse Navigator Note:    68 year old female with history of hypertension, chronic obstructive pulmonary disease, diabetes mellitus type II, Coronary Artery Disease - s/p drug eluting sent to right coronary artery, dyspnea on exertion, and hyperlipidemia with an outpatient abnormal cardiac function test and chest pain, who presented to [facility] for elective cardiac catheterization on 07/31/2019.  Coronary arteriography revealed 90% stenosis just distal to this teen coronary stent. The patient underwent percutaneous coronary intervention, receiving a 2.75 x 15 mm Resolute Onyx drug-eluting stent. Left ventriculography revealed normal left ventricular function.    EDUCATION:   Reviewed the location of CAD and where his / her stent was placed. Informed patient she will be given a stent card. Explained the purpose of the stent card. Instructed patient to keep stent card in her wallet.  ? Discussed modifiable risk factors including controlling blood pressure, cholesterol, and blood sugar; following heart healthy diet; maintaining healthy weight; exercise; and smoking cessation, if applicable.   ? Discussed cardiac medications including rationale for taking, mechanisms of action, and side effects. Stressed the importance of taking medications as prescribed.  ? Discussed emergency plan for heart attack symptoms. Patient verbalized understanding of need to call 911 and not to drive himself to ER if having cardiac symptoms / chest pain.  Patient discussed pain and discomfort she experienced a couple of weeks prior to her stress test where she had a pressure / squeezing sensation in her chest and left arm.  After the pressure / squeezing subsided she felt like she was going to pass out.  We discussed the need to call 911 if she has these symptoms again.   ? Diet of low sodium, low fat, low cholesterol heart healthy / carb modified diet discussed.  ? Smoking Cessation - Patient is a  FORMER smoker.   ? Exercise - Benefits of exercised discussed.  Patient does not exercise.  Patient does work part time with [employer].  She also reports having had a right knee replacement with compartmental syndrome postoperatively. Patient is able to walk, but ambulates with a limp. Explained to patient her cardiologist has referred her to outpatient Cardiac Rehab at [facility].  Overview of the program provided.   Patient reports when she had her first stent she did not participate in Cardiac Rehab.  She also kept smoking for a period of time, but has since quit.  Patient verbally acknowledges she needs to exercise.  Patient asking questions about co-pay amounts.  Informational letter with CPT billing codes provided to patient.  Patient plans to check with her insurance company to see what her out-of-pocket expenses will be if she participates.  Patient agreeable to being contacted by Cardiac Rehab dept, but would like to wait until after December 9th, 2020.  Patient is scheduled for cystoscopy on 08/16/2019 due to problems with hematuria and recurrent urinary tract infections.  Patient stated this is weighing on her mind and she would like to wait to start Cardiac Rehab until after this procedure. In the meantime patient plans to contact her insurance and check on co-pay. Cardiac Rehab referral deferred in workqueue until August 21, 2019.     Patient appreciative of the information.  ? Roanna Epley, RN, BSN, [place]  [facility]  Cardiovascular & Pulmonary Nurse Navigator  Direct Line: [phone]  Department Phone #: [phone] Fax: [phone]  Email Address: [email or web address]

## 2019-08-14 ENCOUNTER — Telehealth: Payer: Self-pay | Admitting: Urology

## 2019-08-14 DIAGNOSIS — I10 Essential (primary) hypertension: Secondary | ICD-10-CM | POA: Diagnosis not present

## 2019-08-14 DIAGNOSIS — R0600 Dyspnea, unspecified: Secondary | ICD-10-CM | POA: Diagnosis not present

## 2019-08-14 DIAGNOSIS — J449 Chronic obstructive pulmonary disease, unspecified: Secondary | ICD-10-CM | POA: Diagnosis not present

## 2019-08-14 DIAGNOSIS — R079 Chest pain, unspecified: Secondary | ICD-10-CM | POA: Diagnosis not present

## 2019-08-14 DIAGNOSIS — Z955 Presence of coronary angioplasty implant and graft: Secondary | ICD-10-CM | POA: Diagnosis not present

## 2019-08-14 DIAGNOSIS — E119 Type 2 diabetes mellitus without complications: Secondary | ICD-10-CM | POA: Diagnosis not present

## 2019-08-14 NOTE — Telephone Encounter (Signed)
Yes, cysto ok, thanks  Nickolas Madrid, MD 08/14/2019

## 2019-08-14 NOTE — Telephone Encounter (Signed)
Pt has cysto scheduled for this Wednesday.  Since last appt, she had 90% blockage in heart and stent placed 2 weeks ago.  She had f/u w/Paraschos today and he said to make sure we knew pt was taking Effient and could not stop it for a year.  Pt just wanted to make sure it was safe for her to have cysto.

## 2019-08-16 ENCOUNTER — Encounter: Payer: Self-pay | Admitting: Urology

## 2019-08-16 ENCOUNTER — Ambulatory Visit: Payer: PPO | Admitting: Urology

## 2019-08-16 ENCOUNTER — Other Ambulatory Visit: Payer: Self-pay

## 2019-08-16 VITALS — BP 174/82 | HR 83 | Ht 66.0 in | Wt 189.0 lb

## 2019-08-16 DIAGNOSIS — R102 Pelvic and perineal pain: Secondary | ICD-10-CM

## 2019-08-16 DIAGNOSIS — N390 Urinary tract infection, site not specified: Secondary | ICD-10-CM

## 2019-08-16 NOTE — Progress Notes (Signed)
Cystoscopy Procedure Note:  Indication: Irritative voiding symptoms, r/o CIS, extensive smoking history  After informed consent and discussion of the procedure and its risks, Leslie Olsen was positioned and prepped in the standard fashion. Cystoscopy was performed with a flexible cystoscope. The urethra, bladder neck and entire bladder was visualized in a standard fashion. The ureteral orifices were visualized in their normal location and orientation.  No abnormalities on retroflexion.  Bladder mucosa normal throughout.  Findings: Normal appearing bladder  Assessment and Plan: 68 year old female with irritative voiding symptoms, extensive smoking history, and OAB.  Normal cystoscopy today.  Continue oxybutynin 50 mg XL for OAB symptoms Continue cranberry prophylaxis for history of UTIs Trial of Uribel as needed for dysuria with negative urinalysis RTC 6 months symptom check  Nickolas Madrid, MD 08/16/2019

## 2019-08-22 ENCOUNTER — Other Ambulatory Visit: Payer: Self-pay

## 2019-08-22 ENCOUNTER — Encounter: Payer: PPO | Attending: Cardiology

## 2019-08-22 DIAGNOSIS — K219 Gastro-esophageal reflux disease without esophagitis: Secondary | ICD-10-CM | POA: Insufficient documentation

## 2019-08-22 DIAGNOSIS — Z955 Presence of coronary angioplasty implant and graft: Secondary | ICD-10-CM | POA: Insufficient documentation

## 2019-08-22 DIAGNOSIS — J449 Chronic obstructive pulmonary disease, unspecified: Secondary | ICD-10-CM | POA: Insufficient documentation

## 2019-08-22 DIAGNOSIS — Z87891 Personal history of nicotine dependence: Secondary | ICD-10-CM | POA: Insufficient documentation

## 2019-08-22 DIAGNOSIS — Z7984 Long term (current) use of oral hypoglycemic drugs: Secondary | ICD-10-CM | POA: Insufficient documentation

## 2019-08-22 DIAGNOSIS — Z79899 Other long term (current) drug therapy: Secondary | ICD-10-CM | POA: Insufficient documentation

## 2019-08-22 DIAGNOSIS — Z79890 Hormone replacement therapy (postmenopausal): Secondary | ICD-10-CM | POA: Insufficient documentation

## 2019-08-22 DIAGNOSIS — D649 Anemia, unspecified: Secondary | ICD-10-CM | POA: Insufficient documentation

## 2019-08-22 DIAGNOSIS — Z7982 Long term (current) use of aspirin: Secondary | ICD-10-CM | POA: Insufficient documentation

## 2019-08-22 DIAGNOSIS — E119 Type 2 diabetes mellitus without complications: Secondary | ICD-10-CM | POA: Insufficient documentation

## 2019-08-22 DIAGNOSIS — E039 Hypothyroidism, unspecified: Secondary | ICD-10-CM | POA: Insufficient documentation

## 2019-08-22 DIAGNOSIS — I10 Essential (primary) hypertension: Secondary | ICD-10-CM | POA: Insufficient documentation

## 2019-08-22 NOTE — Progress Notes (Signed)
Virtual Orientation performed. Patient informed when to come in for RD and EP orientation. Diagnosis can be found in [facility] 07/20/2019 S/P Coronary Artery Stent.

## 2019-08-24 ENCOUNTER — Other Ambulatory Visit: Payer: Self-pay

## 2019-08-24 VITALS — Ht 64.75 in | Wt 191.6 lb

## 2019-08-24 DIAGNOSIS — Z955 Presence of coronary angioplasty implant and graft: Secondary | ICD-10-CM

## 2019-08-24 DIAGNOSIS — Z7984 Long term (current) use of oral hypoglycemic drugs: Secondary | ICD-10-CM | POA: Diagnosis not present

## 2019-08-24 DIAGNOSIS — Z79899 Other long term (current) drug therapy: Secondary | ICD-10-CM | POA: Diagnosis not present

## 2019-08-24 DIAGNOSIS — J449 Chronic obstructive pulmonary disease, unspecified: Secondary | ICD-10-CM | POA: Diagnosis not present

## 2019-08-24 DIAGNOSIS — E039 Hypothyroidism, unspecified: Secondary | ICD-10-CM | POA: Diagnosis not present

## 2019-08-24 DIAGNOSIS — I10 Essential (primary) hypertension: Secondary | ICD-10-CM | POA: Diagnosis not present

## 2019-08-24 DIAGNOSIS — Z7982 Long term (current) use of aspirin: Secondary | ICD-10-CM | POA: Diagnosis not present

## 2019-08-24 DIAGNOSIS — E119 Type 2 diabetes mellitus without complications: Secondary | ICD-10-CM | POA: Diagnosis not present

## 2019-08-24 DIAGNOSIS — Z79890 Hormone replacement therapy (postmenopausal): Secondary | ICD-10-CM | POA: Diagnosis not present

## 2019-08-24 DIAGNOSIS — K219 Gastro-esophageal reflux disease without esophagitis: Secondary | ICD-10-CM | POA: Diagnosis not present

## 2019-08-24 DIAGNOSIS — Z87891 Personal history of nicotine dependence: Secondary | ICD-10-CM | POA: Diagnosis not present

## 2019-08-24 DIAGNOSIS — D649 Anemia, unspecified: Secondary | ICD-10-CM | POA: Diagnosis not present

## 2019-08-24 NOTE — Patient Instructions (Signed)
Patient Instructions  Patient Details  Name: Leslie Olsen MRN: [id number] Date of Birth: August 26, 1951 Referring Provider:  Isaias Cowman, MD  Below are your personal goals for exercise, nutrition, and risk factors. Our goal is to help you stay on track towards obtaining and maintaining these goals. We will be discussing your progress on these goals with you throughout the program.  Initial Exercise Prescription: Initial Exercise Prescription - 08/24/19 1400      Date of Initial Exercise RX and Referring Provider   Date  08/24/19    Referring Provider  Paraschos      Treadmill   MPH  1.9    Grade  0.5    Minutes  15    METs  2.6      Recumbant Bike   Level  2    RPM  60    Minutes  15    METs  2.6      NuStep   Level  3    SPM  80    Minutes  15    METs  2.6      REL-XR   Level  3    Speed  50    Minutes  15    METs  2.6      T5 Nustep   Level  2    SPM  80    Minutes  15    METs  2.6      Prescription Details   Frequency (times per week)  3    Duration  Progress to 30 minutes of continuous aerobic without signs/symptoms of physical distress      Intensity   THRR 40-80% of Max Heartrate  122-141    Ratings of Perceived Exertion  11-15    Perceived Dyspnea  0-4      Resistance Training   Training Prescription  Yes    Weight  3 lb    Reps  10-15       Exercise Goals: Frequency: Be able to perform aerobic exercise two to three times per week in program working toward 2-5 days per week of home exercise.  Intensity: Work with a perceived exertion of 11 (fairly light) - 15 (hard) while following your exercise prescription.  We will make changes to your prescription with you as you progress through the program.   Duration: Be able to do 30 to 45 minutes of continuous aerobic exercise in addition to a 5 minute warm-up and a 5 minute cool-down routine.   Nutrition Goals: Your personal nutrition goals will be established when you do your nutrition  analysis with the dietician.  The following are general nutrition guidelines to follow: Cholesterol < 200mg /day Sodium < 1500mg /day Fiber: Women over 50 yrs - 21 grams per day  Personal Goals: Personal Goals and Risk Factors at Admission - 08/24/19 1453      Core Components/Risk Factors/Patient Goals on Admission    Weight Management  Yes;Weight Loss    Intervention  Weight Management: Develop a combined nutrition and exercise program designed to reach desired caloric intake, while maintaining appropriate intake of nutrient and fiber, sodium and fats, and appropriate energy expenditure required for the weight goal.;Weight Management: Provide education and appropriate resources to help participant work on and attain dietary goals.;Weight Management/Obesity: Establish reasonable short term and long term weight goals.    Admit Weight  191 lb 9.6 oz (86.9 kg)    Goal Weight: Short Term  180 lb (81.6 kg)    Goal  Weight: Long Term  170 lb (77.1 kg)    Expected Outcomes  Short Term: Continue to assess and modify interventions until short term weight is achieved;Weight Maintenance: Understanding of the daily nutrition guidelines, which includes 25-35% calories from fat, 7% or less cal from saturated fats, less than 200mg  cholesterol, less than 1.5gm of sodium, & 5 or more servings of fruits and vegetables daily;Long Term: Adherence to nutrition and physical activity/exercise program aimed toward attainment of established weight goal;Weight Loss: Understanding of general recommendations for a balanced deficit meal plan, which promotes 1-2 lb weight loss per week and includes a negative energy balance of [phone] kcal/d;Understanding recommendations for meals to include 15-35% energy as protein, 25-35% energy from fat, 35-60% energy from carbohydrates, less than 200mg  of dietary cholesterol, 20-35 gm of total fiber daily;Understanding of distribution of calorie intake throughout the day with the consumption of  4-5 meals/snacks    Intervention  Provide education about signs/symptoms and action to take for hypo/hyperglycemia.;Provide education about proper nutrition, including hydration, and aerobic/resistive exercise prescription along with prescribed medications to achieve blood glucose in normal ranges: Fasting glucose 65-99 mg/dL    Intervention  Provide education on lifestyle modifcations including regular physical activity/exercise, weight management, moderate sodium restriction and increased consumption of fresh fruit, vegetables, and low fat dairy, alcohol moderation, and smoking cessation.;Monitor prescription use compliance.    Expected Outcomes  Short Term: Continued assessment and intervention until BP is < 140/54mm HG in hypertensive participants. < 130/48mm HG in hypertensive participants with diabetes, heart failure or chronic kidney disease.;Long Term: Maintenance of blood pressure at goal levels.    Intervention  Provide education and support for participant on nutrition & aerobic/resistive exercise along with prescribed medications to achieve LDL 70mg , HDL >40mg .    Expected Outcomes  Long Term: Cholesterol controlled with medications as prescribed, with individualized exercise RX and with personalized nutrition plan. Value goals: LDL < 70mg , HDL > 40 mg.;Short Term: Participant states understanding of desired cholesterol values and is compliant with medications prescribed. Participant is following exercise prescription and nutrition guidelines.       Tobacco Use Initial Evaluation: Social History   Tobacco Use  Smoking Status Former Smoker  . Packs/day: 2.00  . Years: 45.00  . Pack years: 90.00  . Types: Cigarettes  . Quit date: 11/06/2011  . Years since quitting: 7.8  Smokeless Tobacco Never Used    Exercise Goals and Review: Exercise Goals    Row Name 08/24/19 1442             Exercise Goals   Increase Physical Activity  Yes       Intervention  Provide advice, education,  support and counseling about physical activity/exercise needs.;Develop an individualized exercise prescription for aerobic and resistive training based on initial evaluation findings, risk stratification, comorbidities and participant's personal goals.       Expected Outcomes  Short Term: Attend rehab on a regular basis to increase amount of physical activity.;Long Term: Add in home exercise to make exercise part of routine and to increase amount of physical activity.;Long Term: Exercising regularly at least 3-5 days a week.       Increase Strength and Stamina  Yes       Intervention  Provide advice, education, support and counseling about physical activity/exercise needs.;Develop an individualized exercise prescription for aerobic and resistive training based on initial evaluation findings, risk stratification, comorbidities and participant's personal goals.       Expected Outcomes  Short Term: Increase workloads  from initial exercise prescription for resistance, speed, and METs.;Short Term: Perform resistance training exercises routinely during rehab and add in resistance training at home;Long Term: Improve cardiorespiratory fitness, muscular endurance and strength as measured by increased METs and functional capacity (6MWT)       Able to understand and use rate of perceived exertion (RPE) scale  Yes       Intervention  Provide education and explanation on how to use RPE scale       Expected Outcomes  Short Term: Able to use RPE daily in rehab to express subjective intensity level;Long Term:  Able to use RPE to guide intensity level when exercising independently       Knowledge and understanding of Target Heart Rate Range (THRR)  Yes       Intervention  Provide education and explanation of THRR including how the numbers were predicted and where they are located for reference       Expected Outcomes  Short Term: Able to state/look up THRR;Short Term: Able to use daily as guideline for intensity in  rehab;Long Term: Able to use THRR to govern intensity when exercising independently       Able to check pulse independently  Yes       Intervention  Provide education and demonstration on how to check pulse in carotid and radial arteries.;Review the importance of being able to check your own pulse for safety during independent exercise       Expected Outcomes  Short Term: Able to explain why pulse checking is important during independent exercise;Long Term: Able to check pulse independently and accurately       Understanding of Exercise Prescription  Yes       Intervention  Provide education, explanation, and written materials on patient's individual exercise prescription       Expected Outcomes  Short Term: Able to explain program exercise prescription;Long Term: Able to explain home exercise prescription to exercise independently          Copy of goals given to participant.

## 2019-08-24 NOTE — Progress Notes (Signed)
Cardiac Individual Treatment Plan  Patient Details  Name: Leslie Olsen MRN: [id number] Date of Birth: 02-26-1951 Referring Provider:     Cardiac Rehab from 08/24/2019 in [facility] Cardiac and Pulmonary Rehab  Referring Provider  Paraschos      Initial Encounter Date:    Cardiac Rehab from 08/24/2019 in [facility] Cardiac and Pulmonary Rehab  Date  08/24/19      Visit Diagnosis: Status post coronary artery stent placement  Patient's Home Medications on Admission:  Current Outpatient Medications:  .  aspirin 81 MG tablet, Take 81 mg by mouth daily., Disp: , Rfl:  .  clonazePAM (KLONOPIN) 0.5 MG tablet, Take 0.5 mg by mouth at bedtime. , Disp: , Rfl:  .  ferrous gluconate (FERGON) 324 MG tablet, Take 324 mg by mouth daily., Disp: , Rfl: 11 .  glucose blood (ONE TOUCH ULTRA TEST) test strip, USE ONE STRIP TO CHECK GLUCOSE TWICE DAILY, Disp: 100 each, Rfl: 12 .  levETIRAcetam (KEPPRA) 500 MG tablet, Take 500 mg by mouth 2 (two) times daily. , Disp: , Rfl:  .  lisinopril (ZESTRIL) 5 MG tablet, Take 1 tablet (5 mg total) by mouth daily. (Patient taking differently: Take 10 mg by mouth daily. ), Disp: 90 tablet, Rfl: 1 .  metFORMIN (GLUCOPHAGE) 500 MG tablet, Take 1 tablet (500 mg total) by mouth daily with breakfast. (Patient taking differently: Take 1,000 mg by mouth 2 (two) times daily with a meal. ), Disp: 90 tablet, Rfl: 1 .  oxybutynin (DITROPAN XL) 15 MG 24 hr tablet, Take 1 tablet (15 mg total) by mouth daily. (Patient taking differently: Take 15 mg by mouth at bedtime. ), Disp: 30 tablet, Rfl: 11 .  pantoprazole (PROTONIX) 40 MG tablet, Take 40 mg by mouth daily as needed (Heartburn). , Disp: , Rfl: 3 .  prasugrel (EFFIENT) 10 MG TABS tablet, Take 1 tablet (10 mg total) by mouth daily., Disp: 30 tablet, Rfl: 5 .  SUMAtriptan (IMITREX) 100 MG tablet, Take 1 tablet (100 mg total) by mouth every 2 (two) hours as needed for migraine. May repeat in 2 hours if headache persists or recurs., Disp: 10  tablet, Rfl: 12 .  SYNTHROID 100 MCG tablet, Take 1 tablet (100 mcg total) by mouth daily before breakfast., Disp: 90 tablet, Rfl: 3  Past Medical History: Past Medical History:  Diagnosis Date  . Anemia   . Cephalalgia 04/08/2015  . Chronic headaches   . COPD (chronic obstructive pulmonary disease) ([place])   . Diabetes mellitus without complication ([place])   . Fatigue due to excessive exertion 08/22/2018  . GERD (gastroesophageal reflux disease)   . Hypertension   . Hypothyroidism   . [facility] spotted fever) 07/27/2016    Tobacco Use: Social History   Tobacco Use  Smoking Status Former Smoker  . Packs/day: 2.00  . Years: 45.00  . Pack years: 90.00  . Types: Cigarettes  . Quit date: 11/06/2011  . Years since quitting: 7.8  Smokeless Tobacco Never Used    Labs: Recent Review Flowsheet Data    Labs for ITP Cardiac and Pulmonary Rehab Latest Ref Rng & Units 09/01/2017 12/28/2017 06/29/2018 10/07/2018 01/06/2019   Cholestrol 100 - 199 mg/dL - 265(H) 245(H) - 266(H)   LDLCALC 0 - 99 mg/dL - 178(H) 154(H) - 184(H)   HDL >39 mg/dL - 47 42 - 43   Trlycerides 0 - 149 mg/dL - 198(H) 246(H) - 194(H)   Hemoglobin A1c <7.0 % 6.1 5.8 5.9(H) 6.0 6.1  Exercise Target Goals: Exercise Program Goal: Individual exercise prescription set using results from initial 6 min walk test and THRR while considering  patient's activity barriers and safety.   Exercise Prescription Goal: Initial exercise prescription builds to 30-45 minutes a day of aerobic activity, 2-3 days per week.  Home exercise guidelines will be given to patient during program as part of exercise prescription that the participant will acknowledge.  Activity Barriers & Risk Stratification:   6 Minute Walk: 6 Minute Walk    Row Name 08/24/19 1429         6 Minute Walk   Phase  Initial     Distance  1025 feet     Walk Time  6 minutes     # of Rest Breaks  0     MPH  1.94     METS  2.63     RPE  13      Perceived Dyspnea   1     VO2 Peak  9.22     Symptoms  No     Resting HR  104 bpm     Resting BP  128/68     Resting Oxygen Saturation   98 %     Exercise Oxygen Saturation  during 6 min walk  97 %     Max Ex. HR  131 bpm     Max Ex. BP  152/64     2 Minute Post BP  124/64        Oxygen Initial Assessment:   Oxygen Re-Evaluation:   Oxygen Discharge (Final Oxygen Re-Evaluation):   Initial Exercise Prescription: Initial Exercise Prescription - 08/24/19 1400      Date of Initial Exercise RX and Referring Provider   Date  08/24/19    Referring Provider  Paraschos      Treadmill   MPH  1.9    Grade  0.5    Minutes  15    METs  2.6      Recumbant Bike   Level  2    RPM  60    Minutes  15    METs  2.6      NuStep   Level  3    SPM  80    Minutes  15    METs  2.6      REL-XR   Level  3    Speed  50    Minutes  15    METs  2.6      T5 Nustep   Level  2    SPM  80    Minutes  15    METs  2.6      Prescription Details   Frequency (times per week)  3    Duration  Progress to 30 minutes of continuous aerobic without signs/symptoms of physical distress      Intensity   THRR 40-80% of Max Heartrate  122-141    Ratings of Perceived Exertion  11-15    Perceived Dyspnea  0-4      Resistance Training   Training Prescription  Yes    Weight  3 lb    Reps  10-15       Perform Capillary Blood Glucose checks as needed.  Exercise Prescription Changes: Exercise Prescription Changes    Row Name 08/24/19 1400             Response to Exercise   Blood Pressure (Admit)  128/68  Blood Pressure (Exercise)  152/64       Blood Pressure (Exit)  124/64       Heart Rate (Admit)  104 bpm       Heart Rate (Exercise)  131 bpm       Heart Rate (Exit)  73 bpm       Oxygen Saturation (Admit)  98 %       Oxygen Saturation (Exercise)  97 %       Rating of Perceived Exertion (Exercise)  13       Perceived Dyspnea (Exercise)  1          Exercise  Comments:   Exercise Goals and Review: Exercise Goals    Row Name 08/24/19 1442             Exercise Goals   Increase Physical Activity  Yes       Intervention  Provide advice, education, support and counseling about physical activity/exercise needs.;Develop an individualized exercise prescription for aerobic and resistive training based on initial evaluation findings, risk stratification, comorbidities and participant's personal goals.       Expected Outcomes  Short Term: Attend rehab on a regular basis to increase amount of physical activity.;Long Term: Add in home exercise to make exercise part of routine and to increase amount of physical activity.;Long Term: Exercising regularly at least 3-5 days a week.       Increase Strength and Stamina  Yes       Intervention  Provide advice, education, support and counseling about physical activity/exercise needs.;Develop an individualized exercise prescription for aerobic and resistive training based on initial evaluation findings, risk stratification, comorbidities and participant's personal goals.       Expected Outcomes  Short Term: Increase workloads from initial exercise prescription for resistance, speed, and METs.;Short Term: Perform resistance training exercises routinely during rehab and add in resistance training at home;Long Term: Improve cardiorespiratory fitness, muscular endurance and strength as measured by increased METs and functional capacity (6MWT)       Able to understand and use rate of perceived exertion (RPE) scale  Yes       Intervention  Provide education and explanation on how to use RPE scale       Expected Outcomes  Short Term: Able to use RPE daily in rehab to express subjective intensity level;Long Term:  Able to use RPE to guide intensity level when exercising independently       Knowledge and understanding of Target Heart Rate Range (THRR)  Yes       Intervention  Provide education and explanation of THRR including how  the numbers were predicted and where they are located for reference       Expected Outcomes  Short Term: Able to state/look up THRR;Short Term: Able to use daily as guideline for intensity in rehab;Long Term: Able to use THRR to govern intensity when exercising independently       Able to check pulse independently  Yes       Intervention  Provide education and demonstration on how to check pulse in carotid and radial arteries.;Review the importance of being able to check your own pulse for safety during independent exercise       Expected Outcomes  Short Term: Able to explain why pulse checking is important during independent exercise;Long Term: Able to check pulse independently and accurately       Understanding of Exercise Prescription  Yes       Intervention  Provide education,  explanation, and written materials on patient's individual exercise prescription       Expected Outcomes  Short Term: Able to explain program exercise prescription;Long Term: Able to explain home exercise prescription to exercise independently          Exercise Goals Re-Evaluation :   Discharge Exercise Prescription (Final Exercise Prescription Changes): Exercise Prescription Changes - 08/24/19 1400      Response to Exercise   Blood Pressure (Admit)  128/68    Blood Pressure (Exercise)  152/64    Blood Pressure (Exit)  124/64    Heart Rate (Admit)  104 bpm    Heart Rate (Exercise)  131 bpm    Heart Rate (Exit)  73 bpm    Oxygen Saturation (Admit)  98 %    Oxygen Saturation (Exercise)  97 %    Rating of Perceived Exertion (Exercise)  13    Perceived Dyspnea (Exercise)  1       Nutrition:  Target Goals: Understanding of nutrition guidelines, daily intake of sodium <1572m, cholesterol <2061m calories 30% from fat and 7% or less from saturated fats, daily to have 5 or more servings of fruits and vegetables.  Biometrics: Pre Biometrics - 08/24/19 1442      Pre Biometrics   Height  5' 4.75" (1.645 m)     Weight  191 lb 9.6 oz (86.9 kg)    BMI (Calculated)  32.12    Single Leg Stand  4.78 seconds        Nutrition Therapy Plan and Nutrition Goals:   Nutrition Assessments:   Nutrition Goals Re-Evaluation:   Nutrition Goals Discharge (Final Nutrition Goals Re-Evaluation):   Psychosocial: Target Goals: Acknowledge presence or absence of significant depression and/or stress, maximize coping skills, provide positive support system. Participant is able to verbalize types and ability to use techniques and skills needed for reducing stress and depression.   Initial Review & Psychosocial Screening: Initial Psych Review & Screening - 08/22/19 1417      Initial Review   Current issues with  Current Stress Concerns    Comments  Patient is mostly stressed due to [place] wears her mask and tries to protect herself.      Family Dynamics   Good Support System?  Yes    Comments  She looks to her son and grand daughter for support.      Barriers   Psychosocial barriers to participate in program  The patient should benefit from training in stress management and relaxation.      Screening Interventions   Interventions  Encouraged to exercise;Provide feedback about the scores to participant;To provide support and resources with identified psychosocial needs    Expected Outcomes  Short Term goal: Utilizing psychosocial counselor, staff and physician to assist with identification of specific Stressors or current issues interfering with healing process. Setting desired goal for each stressor or current issue identified.;Long Term Goal: Stressors or current issues are controlled or eliminated.;Long Term goal: The participant improves quality of Life and PHQ9 Scores as seen by post scores and/or verbalization of changes;Short Term goal: Identification and review with participant of any Quality of Life or Depression concerns found by scoring the questionnaire.       Quality of Life Scores:  Quality  of Life - 08/24/19 1447      Quality of Life   Select  Quality of Life      Quality of Life Scores   Health/Function Pre  27.39 %    Socioeconomic Pre  30 %    Psych/Spiritual Pre  28.29 %    Family Pre  25.5 %    GLOBAL Pre  27.78 %      Scores of 19 and below usually indicate a poorer quality of life in these areas.  A difference of  2-3 points is a clinically meaningful difference.  A difference of 2-3 points in the total score of the Quality of Life Index has been associated with significant improvement in overall quality of life, self-image, physical symptoms, and general health in studies assessing change in quality of life.  PHQ-9: Recent Review Flowsheet Data    Depression screen [facility] 2/9 08/24/2019 10/07/2018 09/09/2017 09/03/2016 02/21/2016   Decreased Interest 0 0 0 0 0   Down, Depressed, Hopeless 0 0 0 0 0   PHQ - 2 Score 0 0 0 0 0   Altered sleeping 1 1 - 2 -   Tired, decreased energy 1 3 - 1 -   Change in appetite 2 1 - 3 -   Feeling bad or failure about yourself  0 0 - 0 -   Trouble concentrating 0 0 - 0 -   Moving slowly or fidgety/restless 0 1 - 1 -   Suicidal thoughts 0 0 - 0 -   PHQ-9 Score 4 6 - 7 -   Difficult doing work/chores Somewhat difficult Very difficult - - -     Interpretation of Total Score  Total Score Depression Severity:  1-4 = Minimal depression, 5-9 = Mild depression, 10-14 = Moderate depression, 15-19 = Moderately severe depression, 20-27 = Severe depression   Psychosocial Evaluation and Intervention: Psychosocial Evaluation - 08/22/19 1421      Psychosocial Evaluation & Interventions   Interventions  Encouraged to exercise with the program and follow exercise prescription    Comments  Patient is mostly stressed due to [place]. She wears her mask and tries to protect herself. She is ready to exercise and get herself in shape and has a positive attitude.    Expected Outcomes  Short: Attend HeartTrack stress management education to decrease  stress. Long: Maintain exercise Post HeartTrack to keep stress at a minimum    Continue Psychosocial Services   Follow up required by staff       Psychosocial Re-Evaluation:   Psychosocial Discharge (Final Psychosocial Re-Evaluation):   Vocational Rehabilitation: Provide vocational rehab assistance to qualifying candidates.   Vocational Rehab Evaluation & Intervention:   Education: Education Goals: Education classes will be provided on a variety of topics geared toward better understanding of heart health and risk factor modification. Participant will state understanding/return demonstration of topics presented as noted by education test scores.  Learning Barriers/Preferences: Learning Barriers/Preferences - 08/22/19 1419      Learning Barriers/Preferences   Learning Barriers  None    Learning Preferences  None       Education Topics:  AED/CPR: - Group verbal and written instruction with the use of models to demonstrate the basic use of the AED with the basic ABC's of resuscitation.   General Nutrition Guidelines/Fats and Fiber: -Group instruction provided by verbal, written material, models and posters to present the general guidelines for heart healthy nutrition. Gives an explanation and review of dietary fats and fiber.   Controlling Sodium/Reading Food Labels: -Group verbal and written material supporting the discussion of sodium use in heart healthy nutrition. Review and explanation with models, verbal and written materials for utilization of the food label.   Exercise Physiology & General Exercise  Guidelines: - Group verbal and written instruction with models to review the exercise physiology of the cardiovascular system and associated critical values. Provides general exercise guidelines with specific guidelines to those with heart or lung disease.    Cardiac Rehab from 08/24/2019 in [facility] Cardiac and Pulmonary Rehab  Date  08/24/19  Educator  AS  Instruction  Review Code  1- Verbalizes Understanding      Aerobic Exercise & Resistance Training: - Gives group verbal and written instruction on the various components of exercise. Focuses on aerobic and resistive training programs and the benefits of this training and how to safely progress through these programs..   Flexibility, Balance, Mind/Body Relaxation: Provides group verbal/written instruction on the benefits of flexibility and balance training, including mind/body exercise modes such as yoga, pilates and tai chi.  Demonstration and skill practice provided.   Stress and Anxiety: - Provides group verbal and written instruction about the health risks of elevated stress and causes of high stress.  Discuss the correlation between heart/lung disease and anxiety and treatment options. Review healthy ways to manage with stress and anxiety.   Depression: - Provides group verbal and written instruction on the correlation between heart/lung disease and depressed mood, treatment options, and the stigmas associated with seeking treatment.   Anatomy & Physiology of the Heart: - Group verbal and written instruction and models provide basic cardiac anatomy and physiology, with the coronary electrical and arterial systems. Review of Valvular disease and Heart Failure   Cardiac Procedures: - Group verbal and written instruction to review commonly prescribed medications for heart disease. Reviews the medication, class of the drug, and side effects. Includes the steps to properly store meds and maintain the prescription regimen. (beta blockers and nitrates)   Cardiac Medications I: - Group verbal and written instruction to review commonly prescribed medications for heart disease. Reviews the medication, class of the drug, and side effects. Includes the steps to properly store meds and maintain the prescription regimen.   Cardiac Medications II: -Group verbal and written instruction to review commonly  prescribed medications for heart disease. Reviews the medication, class of the drug, and side effects. (all other drug classes)    Go Sex-Intimacy & Heart Disease, Get SMART - Goal Setting: - Group verbal and written instruction through game format to discuss heart disease and the return to sexual intimacy. Provides group verbal and written material to discuss and apply goal setting through the application of the S.M.A.R.T. Method.   Other Matters of the Heart: - Provides group verbal, written materials and models to describe Stable Angina and Peripheral Artery. Includes description of the disease process and treatment options available to the cardiac patient.   Exercise & Equipment Safety: - Individual verbal instruction and demonstration of equipment use and safety with use of the equipment.   Cardiac Rehab from 08/24/2019 in [facility] Cardiac and Pulmonary Rehab  Date  08/24/19  Educator  AS  Instruction Review Code  1- Verbalizes Understanding      Infection Prevention: - Provides verbal and written material to individual with discussion of infection control including proper hand washing and proper equipment cleaning during exercise session.   Cardiac Rehab from 08/24/2019 in [facility] Cardiac and Pulmonary Rehab  Date  08/24/19  Educator  AS  Instruction Review Code  1- Verbalizes Understanding      Falls Prevention: - Provides verbal and written material to individual with discussion of falls prevention and safety.   Cardiac Rehab from 08/24/2019 in [facility] Cardiac and  Pulmonary Rehab  Date  08/24/19  Educator  AS  Instruction Review Code  1- Verbalizes Understanding      Diabetes: - Individual verbal and written instruction to review signs/symptoms of diabetes, desired ranges of glucose level fasting, after meals and with exercise. Acknowledge that pre and post exercise glucose checks will be done for 3 sessions at entry of program.   Cardiac Rehab from 08/22/2019 in [facility] Cardiac  and Pulmonary Rehab  Date  08/22/19  Educator  [facility]  Instruction Review Code  1- Verbalizes Understanding      Know Your Numbers and Risk Factors: -Group verbal and written instruction about important numbers in your health.  Discussion of what are risk factors and how they play a role in the disease process.  Review of Cholesterol, Blood Pressure, Diabetes, and BMI and the role they play in your overall health.   Sleep Hygiene: -Provides group verbal and written instruction about how sleep can affect your health.  Define sleep hygiene, discuss sleep cycles and impact of sleep habits. Review good sleep hygiene tips.    Other: -Provides group and verbal instruction on various topics (see comments)   Knowledge Questionnaire Score: Knowledge Questionnaire Score - 08/24/19 1446      Knowledge Questionnaire Score   Pre Score  24/26       Core Components/Risk Factors/Patient Goals at Admission: Personal Goals and Risk Factors at Admission - 08/24/19 1453      Core Components/Risk Factors/Patient Goals on Admission    Weight Management  Yes;Weight Loss    Intervention  Weight Management: Develop a combined nutrition and exercise program designed to reach desired caloric intake, while maintaining appropriate intake of nutrient and fiber, sodium and fats, and appropriate energy expenditure required for the weight goal.;Weight Management: Provide education and appropriate resources to help participant work on and attain dietary goals.;Weight Management/Obesity: Establish reasonable short term and long term weight goals.    Admit Weight  191 lb 9.6 oz (86.9 kg)    Goal Weight: Short Term  180 lb (81.6 kg)    Goal Weight: Long Term  170 lb (77.1 kg)    Expected Outcomes  Short Term: Continue to assess and modify interventions until short term weight is achieved;Weight Maintenance: Understanding of the daily nutrition guidelines, which includes 25-35% calories from fat, 7% or less cal from  saturated fats, less than 249m cholesterol, less than 1.5gm of sodium, & 5 or more servings of fruits and vegetables daily;Long Term: Adherence to nutrition and physical activity/exercise program aimed toward attainment of established weight goal;Weight Loss: Understanding of general recommendations for a balanced deficit meal plan, which promotes 1-2 lb weight loss per week and includes a negative energy balance of [id number] kcal/d;Understanding recommendations for meals to include 15-35% energy as protein, 25-35% energy from fat, 35-60% energy from carbohydrates, less than 2035mof dietary cholesterol, 20-35 gm of total fiber daily;Understanding of distribution of calorie intake throughout the day with the consumption of 4-5 meals/snacks    Intervention  Provide education about signs/symptoms and action to take for hypo/hyperglycemia.;Provide education about proper nutrition, including hydration, and aerobic/resistive exercise prescription along with prescribed medications to achieve blood glucose in normal ranges: Fasting glucose 65-99 mg/dL    Intervention  Provide education on lifestyle modifcations including regular physical activity/exercise, weight management, moderate sodium restriction and increased consumption of fresh fruit, vegetables, and low fat dairy, alcohol moderation, and smoking cessation.;Monitor prescription use compliance.    Expected Outcomes  Short Term: Continued assessment and  intervention until BP is < 140/37m HG in hypertensive participants. < 130/862mHG in hypertensive participants with diabetes, heart failure or chronic kidney disease.;Long Term: Maintenance of blood pressure at goal levels.    Intervention  Provide education and support for participant on nutrition & aerobic/resistive exercise along with prescribed medications to achieve LDL <7051mHDL >79m44m  Expected Outcomes  Long Term: Cholesterol controlled with medications as prescribed, with individualized exercise RX  and with personalized nutrition plan. Value goals: LDL < 70mg76mL > 40 mg.;Short Term: Participant states understanding of desired cholesterol values and is compliant with medications prescribed. Participant is following exercise prescription and nutrition guidelines.       Core Components/Risk Factors/Patient Goals Review:    Core Components/Risk Factors/Patient Goals at Discharge (Final Review):    ITP Comments: ITP Comments    Row Name 08/22/19 1427           ITP Comments  Diagnosis can be found in [facility] S/P Coronary Artery Stent.          Comments: initial ITP

## 2019-08-31 ENCOUNTER — Ambulatory Visit: Payer: PPO

## 2019-09-04 ENCOUNTER — Other Ambulatory Visit: Payer: Self-pay

## 2019-09-04 ENCOUNTER — Encounter: Payer: PPO | Admitting: *Deleted

## 2019-09-04 DIAGNOSIS — Z955 Presence of coronary angioplasty implant and graft: Secondary | ICD-10-CM | POA: Diagnosis not present

## 2019-09-04 LAB — GLUCOSE, CAPILLARY
Glucose-Capillary: 112 mg/dL — ABNORMAL HIGH (ref 70–99)
Glucose-Capillary: 135 mg/dL — ABNORMAL HIGH (ref 70–99)

## 2019-09-04 NOTE — Progress Notes (Signed)
Daily Session Note  Patient Details  Name: Leslie Olsen MRN: [id number] Date of Birth: 02-03-1951 Referring Provider:     Cardiac Rehab from 08/24/2019 in [facility]  Referring Provider  Paraschos      Encounter Date: 09/04/2019  Check In: Session Check In - 09/04/19 1713      Check-In   Supervising physician immediately available to respond to emergencies  See telemetry face sheet for immediately available ER MD    Location  [facility]    Staff Present  Renita Papa, RN BSN;Mary Kellie Shropshire, RN, BSN, Lauretta Grill RCP,RRT,BSRT    Virtual Visit  No    Medication changes reported      No    Fall or balance concerns reported     No    Warm-up and Cool-down  Performed on first and last piece of equipment    Resistance Training Performed  Yes    VAD Patient?  No    PAD/SET Patient?  No      Pain Assessment   Currently in Pain?  No/denies          Social History   Tobacco Use  Smoking Status Former Smoker  . Packs/day: 2.00  . Years: 45.00  . Pack years: 90.00  . Types: Cigarettes  . Quit date: 11/06/2011  . Years since quitting: 7.8  Smokeless Tobacco Never Used    Goals Met:  Independence with exercise equipment Exercise tolerated well No report of cardiac concerns or symptoms Strength training completed today  Goals Unmet:  Not Applicable  Comments:  First full day of exercise!  Patient was oriented to gym and equipment including functions, settings, policies, and procedures.  Patient's individual exercise prescription and treatment plan were reviewed.  All starting workloads were established based on the results of the 6 minute walk test done at initial orientation visit.  The plan for exercise progression was also introduced and progression will be customized based on patient's performance and goals.      Dr. Emily Filbert is Medical Director for [facility].

## 2019-09-06 ENCOUNTER — Encounter: Payer: Self-pay | Admitting: *Deleted

## 2019-09-06 DIAGNOSIS — Z955 Presence of coronary angioplasty implant and graft: Secondary | ICD-10-CM

## 2019-09-06 NOTE — Progress Notes (Signed)
Cardiac Individual Treatment Plan  Patient Details  Name: Leslie Olsen MRN: [id number] Date of Birth: 02-26-1951 Referring Provider:     Cardiac Rehab from 08/24/2019 in [facility] Cardiac and Pulmonary Rehab  Referring Provider  Paraschos      Initial Encounter Date:    Cardiac Rehab from 08/24/2019 in [facility] Cardiac and Pulmonary Rehab  Date  08/24/19      Visit Diagnosis: Status post coronary artery stent placement  Patient's Home Medications on Admission:  Current Outpatient Medications:  .  aspirin 81 MG tablet, Take 81 mg by mouth daily., Disp: , Rfl:  .  clonazePAM (KLONOPIN) 0.5 MG tablet, Take 0.5 mg by mouth at bedtime. , Disp: , Rfl:  .  ferrous gluconate (FERGON) 324 MG tablet, Take 324 mg by mouth daily., Disp: , Rfl: 11 .  glucose blood (ONE TOUCH ULTRA TEST) test strip, USE ONE STRIP TO CHECK GLUCOSE TWICE DAILY, Disp: 100 each, Rfl: 12 .  levETIRAcetam (KEPPRA) 500 MG tablet, Take 500 mg by mouth 2 (two) times daily. , Disp: , Rfl:  .  lisinopril (ZESTRIL) 5 MG tablet, Take 1 tablet (5 mg total) by mouth daily. (Patient taking differently: Take 10 mg by mouth daily. ), Disp: 90 tablet, Rfl: 1 .  metFORMIN (GLUCOPHAGE) 500 MG tablet, Take 1 tablet (500 mg total) by mouth daily with breakfast. (Patient taking differently: Take 1,000 mg by mouth 2 (two) times daily with a meal. ), Disp: 90 tablet, Rfl: 1 .  oxybutynin (DITROPAN XL) 15 MG 24 hr tablet, Take 1 tablet (15 mg total) by mouth daily. (Patient taking differently: Take 15 mg by mouth at bedtime. ), Disp: 30 tablet, Rfl: 11 .  pantoprazole (PROTONIX) 40 MG tablet, Take 40 mg by mouth daily as needed (Heartburn). , Disp: , Rfl: 3 .  prasugrel (EFFIENT) 10 MG TABS tablet, Take 1 tablet (10 mg total) by mouth daily., Disp: 30 tablet, Rfl: 5 .  SUMAtriptan (IMITREX) 100 MG tablet, Take 1 tablet (100 mg total) by mouth every 2 (two) hours as needed for migraine. May repeat in 2 hours if headache persists or recurs., Disp: 10  tablet, Rfl: 12 .  SYNTHROID 100 MCG tablet, Take 1 tablet (100 mcg total) by mouth daily before breakfast., Disp: 90 tablet, Rfl: 3  Past Medical History: Past Medical History:  Diagnosis Date  . Anemia   . Cephalalgia 04/08/2015  . Chronic headaches   . COPD (chronic obstructive pulmonary disease) ([place])   . Diabetes mellitus without complication ([place])   . Fatigue due to excessive exertion 08/22/2018  . GERD (gastroesophageal reflux disease)   . Hypertension   . Hypothyroidism   . [facility] spotted fever) 07/27/2016    Tobacco Use: Social History   Tobacco Use  Smoking Status Former Smoker  . Packs/day: 2.00  . Years: 45.00  . Pack years: 90.00  . Types: Cigarettes  . Quit date: 11/06/2011  . Years since quitting: 7.8  Smokeless Tobacco Never Used    Labs: Recent Review Flowsheet Data    Labs for ITP Cardiac and Pulmonary Rehab Latest Ref Rng & Units 09/01/2017 12/28/2017 06/29/2018 10/07/2018 01/06/2019   Cholestrol 100 - 199 mg/dL - 265(H) 245(H) - 266(H)   LDLCALC 0 - 99 mg/dL - 178(H) 154(H) - 184(H)   HDL >39 mg/dL - 47 42 - 43   Trlycerides 0 - 149 mg/dL - 198(H) 246(H) - 194(H)   Hemoglobin A1c <7.0 % 6.1 5.8 5.9(H) 6.0 6.1  Exercise Target Goals: Exercise Program Goal: Individual exercise prescription set using results from initial 6 min walk test and THRR while considering  patient's activity barriers and safety.   Exercise Prescription Goal: Initial exercise prescription builds to 30-45 minutes a day of aerobic activity, 2-3 days per week.  Home exercise guidelines will be given to patient during program as part of exercise prescription that the participant will acknowledge.  Activity Barriers & Risk Stratification:   6 Minute Walk: 6 Minute Walk    Row Name 08/24/19 1429         6 Minute Walk   Phase  Initial     Distance  1025 feet     Walk Time  6 minutes     # of Rest Breaks  0     MPH  1.94     METS  2.63     RPE  13      Perceived Dyspnea   1     VO2 Peak  9.22     Symptoms  No     Resting HR  104 bpm     Resting BP  128/68     Resting Oxygen Saturation   98 %     Exercise Oxygen Saturation  during 6 min walk  97 %     Max Ex. HR  131 bpm     Max Ex. BP  152/64     2 Minute Post BP  124/64        Oxygen Initial Assessment:   Oxygen Re-Evaluation:   Oxygen Discharge (Final Oxygen Re-Evaluation):   Initial Exercise Prescription: Initial Exercise Prescription - 08/24/19 1400      Date of Initial Exercise RX and Referring Provider   Date  08/24/19    Referring Provider  Paraschos      Treadmill   MPH  1.9    Grade  0.5    Minutes  15    METs  2.6      Recumbant Bike   Level  2    RPM  60    Minutes  15    METs  2.6      NuStep   Level  3    SPM  80    Minutes  15    METs  2.6      REL-XR   Level  3    Speed  50    Minutes  15    METs  2.6      T5 Nustep   Level  2    SPM  80    Minutes  15    METs  2.6      Prescription Details   Frequency (times per week)  3    Duration  Progress to 30 minutes of continuous aerobic without signs/symptoms of physical distress      Intensity   THRR 40-80% of Max Heartrate  122-141    Ratings of Perceived Exertion  11-15    Perceived Dyspnea  0-4      Resistance Training   Training Prescription  Yes    Weight  3 lb    Reps  10-15       Perform Capillary Blood Glucose checks as needed.  Exercise Prescription Changes: Exercise Prescription Changes    Row Name 08/24/19 1400             Response to Exercise   Blood Pressure (Admit)  128/68  Blood Pressure (Exercise)  152/64       Blood Pressure (Exit)  124/64       Heart Rate (Admit)  104 bpm       Heart Rate (Exercise)  131 bpm       Heart Rate (Exit)  73 bpm       Oxygen Saturation (Admit)  98 %       Oxygen Saturation (Exercise)  97 %       Rating of Perceived Exertion (Exercise)  13       Perceived Dyspnea (Exercise)  1          Exercise  Comments:   Exercise Goals and Review: Exercise Goals    Row Name 08/24/19 1442             Exercise Goals   Increase Physical Activity  Yes       Intervention  Provide advice, education, support and counseling about physical activity/exercise needs.;Develop an individualized exercise prescription for aerobic and resistive training based on initial evaluation findings, risk stratification, comorbidities and participant's personal goals.       Expected Outcomes  Short Term: Attend rehab on a regular basis to increase amount of physical activity.;Long Term: Add in home exercise to make exercise part of routine and to increase amount of physical activity.;Long Term: Exercising regularly at least 3-5 days a week.       Increase Strength and Stamina  Yes       Intervention  Provide advice, education, support and counseling about physical activity/exercise needs.;Develop an individualized exercise prescription for aerobic and resistive training based on initial evaluation findings, risk stratification, comorbidities and participant's personal goals.       Expected Outcomes  Short Term: Increase workloads from initial exercise prescription for resistance, speed, and METs.;Short Term: Perform resistance training exercises routinely during rehab and add in resistance training at home;Long Term: Improve cardiorespiratory fitness, muscular endurance and strength as measured by increased METs and functional capacity (6MWT)       Able to understand and use rate of perceived exertion (RPE) scale  Yes       Intervention  Provide education and explanation on how to use RPE scale       Expected Outcomes  Short Term: Able to use RPE daily in rehab to express subjective intensity level;Long Term:  Able to use RPE to guide intensity level when exercising independently       Knowledge and understanding of Target Heart Rate Range (THRR)  Yes       Intervention  Provide education and explanation of THRR including how  the numbers were predicted and where they are located for reference       Expected Outcomes  Short Term: Able to state/look up THRR;Short Term: Able to use daily as guideline for intensity in rehab;Long Term: Able to use THRR to govern intensity when exercising independently       Able to check pulse independently  Yes       Intervention  Provide education and demonstration on how to check pulse in carotid and radial arteries.;Review the importance of being able to check your own pulse for safety during independent exercise       Expected Outcomes  Short Term: Able to explain why pulse checking is important during independent exercise;Long Term: Able to check pulse independently and accurately       Understanding of Exercise Prescription  Yes       Intervention  Provide education,  explanation, and written materials on patient's individual exercise prescription       Expected Outcomes  Short Term: Able to explain program exercise prescription;Long Term: Able to explain home exercise prescription to exercise independently          Exercise Goals Re-Evaluation : Exercise Goals Re-Evaluation    Row Name 09/04/19 1718             Exercise Goal Re-Evaluation   Exercise Goals Review  Increase Physical Activity;Increase Strength and Stamina;Able to understand and use rate of perceived exertion (RPE) scale;Knowledge and understanding of Target Heart Rate Range (THRR);Able to check pulse independently;Understanding of Exercise Prescription       Comments  Reviewed RPE scale, THR and program prescription with pt today.  Pt voiced understanding and was given a copy of goals to take home.       Expected Outcomes  Short: Use RPE daily to regulate intensity. Long: Follow program prescription in THR.          Discharge Exercise Prescription (Final Exercise Prescription Changes): Exercise Prescription Changes - 08/24/19 1400      Response to Exercise   Blood Pressure (Admit)  128/68    Blood Pressure  (Exercise)  152/64    Blood Pressure (Exit)  124/64    Heart Rate (Admit)  104 bpm    Heart Rate (Exercise)  131 bpm    Heart Rate (Exit)  73 bpm    Oxygen Saturation (Admit)  98 %    Oxygen Saturation (Exercise)  97 %    Rating of Perceived Exertion (Exercise)  13    Perceived Dyspnea (Exercise)  1       Nutrition:  Target Goals: Understanding of nutrition guidelines, daily intake of sodium '1500mg'$ , cholesterol '200mg'$ , calories 30% from fat and 7% or less from saturated fats, daily to have 5 or more servings of fruits and vegetables.  Biometrics: Pre Biometrics - 08/24/19 1442      Pre Biometrics   Height  5' 4.75" (1.645 m)    Weight  191 lb 9.6 oz (86.9 kg)    BMI (Calculated)  32.12    Single Leg Stand  4.78 seconds        Nutrition Therapy Plan and Nutrition Goals:   Nutrition Assessments:   Nutrition Goals Re-Evaluation:   Nutrition Goals Discharge (Final Nutrition Goals Re-Evaluation):   Psychosocial: Target Goals: Acknowledge presence or absence of significant depression and/or stress, maximize coping skills, provide positive support system. Participant is able to verbalize types and ability to use techniques and skills needed for reducing stress and depression.   Initial Review & Psychosocial Screening: Initial Psych Review & Screening - 08/22/19 1417      Initial Review   Current issues with  Current Stress Concerns    Comments  Patient is mostly stressed due to [place]. She wears her mask and tries to protect herself.      Family Dynamics   Good Support System?  Yes    Comments  She looks to her son and grand daughter for support.      Barriers   Psychosocial barriers to participate in program  The patient should benefit from training in stress management and relaxation.      Screening Interventions   Interventions  Encouraged to exercise;Provide feedback about the scores to participant;To provide support and resources with identified psychosocial  needs    Expected Outcomes  Short Term goal: Utilizing psychosocial counselor, staff and physician to assist with identification  of specific Stressors or current issues interfering with healing process. Setting desired goal for each stressor or current issue identified.;Long Term Goal: Stressors or current issues are controlled or eliminated.;Long Term goal: The participant improves quality of Life and PHQ9 Scores as seen by post scores and/or verbalization of changes;Short Term goal: Identification and review with participant of any Quality of Life or Depression concerns found by scoring the questionnaire.       Quality of Life Scores:  Quality of Life - 08/24/19 1447      Quality of Life   Select  Quality of Life      Quality of Life Scores   Health/Function Pre  27.39 %    Socioeconomic Pre  30 %    Psych/Spiritual Pre  28.29 %    Family Pre  25.5 %    GLOBAL Pre  27.78 %      Scores of 19 and below usually indicate a poorer quality of life in these areas.  A difference of  2-3 points is a clinically meaningful difference.  A difference of 2-3 points in the total score of the Quality of Life Index has been associated with significant improvement in overall quality of life, self-image, physical symptoms, and general health in studies assessing change in quality of life.  PHQ-9: Recent Review Flowsheet Data    Depression screen [facility] 2/9 08/24/2019 10/07/2018 09/09/2017 09/03/2016 02/21/2016   Decreased Interest 0 0 0 0 0   Down, Depressed, Hopeless 0 0 0 0 0   PHQ - 2 Score 0 0 0 0 0   Altered sleeping 1 1 - 2 -   Tired, decreased energy 1 3 - 1 -   Change in appetite 2 1 - 3 -   Feeling bad or failure about yourself  0 0 - 0 -   Trouble concentrating 0 0 - 0 -   Moving slowly or fidgety/restless 0 1 - 1 -   Suicidal thoughts 0 0 - 0 -   PHQ-9 Score 4 6 - 7 -   Difficult doing work/chores Somewhat difficult Very difficult - - -     Interpretation of Total Score  Total Score  Depression Severity:  1-4 = Minimal depression, 5-9 = Mild depression, 10-14 = Moderate depression, 15-19 = Moderately severe depression, 20-27 = Severe depression   Psychosocial Evaluation and Intervention: Psychosocial Evaluation - 08/22/19 1421      Psychosocial Evaluation & Interventions   Interventions  Encouraged to exercise with the program and follow exercise prescription    Comments  Patient is mostly stressed due to [place]. She wears her mask and tries to protect herself. She is ready to exercise and get herself in shape and has a positive attitude.    Expected Outcomes  Short: Attend Heart[facility] stress management education to decrease stress. Long: Maintain exercise Post HeartTrack to keep stress at a minimum    Continue Psychosocial Services   Follow up required by staff       Psychosocial Re-Evaluation:   Psychosocial Discharge (Final Psychosocial Re-Evaluation):   Vocational Rehabilitation: Provide vocational rehab assistance to qualifying candidates.   Vocational Rehab Evaluation & Intervention:   Education: Education Goals: Education classes will be provided on a variety of topics geared toward better understanding of heart health and risk factor modification. Participant will state understanding/return demonstration of topics presented as noted by education test scores.  Learning Barriers/Preferences: Learning Barriers/Preferences - 08/22/19 1419      Learning Barriers/Preferences   Learning  Barriers  None    Learning Preferences  None       Education Topics:  AED/CPR: - Group verbal and written instruction with the use of models to demonstrate the basic use of the AED with the basic ABC's of resuscitation.   General Nutrition Guidelines/Fats and Fiber: -Group instruction provided by verbal, written material, models and posters to present the general guidelines for heart healthy nutrition. Gives an explanation and review of dietary fats and  fiber.   Controlling Sodium/Reading Food Labels: -Group verbal and written material supporting the discussion of sodium use in heart healthy nutrition. Review and explanation with models, verbal and written materials for utilization of the food label.   Exercise Physiology & General Exercise Guidelines: - Group verbal and written instruction with models to review the exercise physiology of the cardiovascular system and associated critical values. Provides general exercise guidelines with specific guidelines to those with heart or lung disease.    Cardiac Rehab from 08/24/2019 in [facility] Cardiac and Pulmonary Rehab  Date  08/24/19  Educator  AS  Instruction Review Code  1- Verbalizes Understanding      Aerobic Exercise & Resistance Training: - Gives group verbal and written instruction on the various components of exercise. Focuses on aerobic and resistive training programs and the benefits of this training and how to safely progress through these programs..   Flexibility, Balance, Mind/Body Relaxation: Provides group verbal/written instruction on the benefits of flexibility and balance training, including mind/body exercise modes such as yoga, pilates and tai chi.  Demonstration and skill practice provided.   Stress and Anxiety: - Provides group verbal and written instruction about the health risks of elevated stress and causes of high stress.  Discuss the correlation between heart/lung disease and anxiety and treatment options. Review healthy ways to manage with stress and anxiety.   Depression: - Provides group verbal and written instruction on the correlation between heart/lung disease and depressed mood, treatment options, and the stigmas associated with seeking treatment.   Anatomy & Physiology of the Heart: - Group verbal and written instruction and models provide basic cardiac anatomy and physiology, with the coronary electrical and arterial systems. Review of Valvular disease  and Heart Failure   Cardiac Procedures: - Group verbal and written instruction to review commonly prescribed medications for heart disease. Reviews the medication, class of the drug, and side effects. Includes the steps to properly store meds and maintain the prescription regimen. (beta blockers and nitrates)   Cardiac Medications I: - Group verbal and written instruction to review commonly prescribed medications for heart disease. Reviews the medication, class of the drug, and side effects. Includes the steps to properly store meds and maintain the prescription regimen.   Cardiac Medications II: -Group verbal and written instruction to review commonly prescribed medications for heart disease. Reviews the medication, class of the drug, and side effects. (all other drug classes)    Go Sex-Intimacy & Heart Disease, Get SMART - Goal Setting: - Group verbal and written instruction through game format to discuss heart disease and the return to sexual intimacy. Provides group verbal and written material to discuss and apply goal setting through the application of the S.M.A.R.T. Method.   Other Matters of the Heart: - Provides group verbal, written materials and models to describe Stable Angina and Peripheral Artery. Includes description of the disease process and treatment options available to the cardiac patient.   Exercise & Equipment Safety: - Individual verbal instruction and demonstration of equipment use and safety  with use of the equipment.   Cardiac Rehab from 08/24/2019 in [facility] Cardiac and Pulmonary Rehab  Date  08/24/19  Educator  AS  Instruction Review Code  1- Verbalizes Understanding      Infection Prevention: - Provides verbal and written material to individual with discussion of infection control including proper hand washing and proper equipment cleaning during exercise session.   Cardiac Rehab from 08/24/2019 in [facility] Cardiac and Pulmonary Rehab  Date  08/24/19   Educator  AS  Instruction Review Code  1- Verbalizes Understanding      Falls Prevention: - Provides verbal and written material to individual with discussion of falls prevention and safety.   Cardiac Rehab from 08/24/2019 in [facility] Cardiac and Pulmonary Rehab  Date  08/24/19  Educator  AS  Instruction Review Code  1- Verbalizes Understanding      Diabetes: - Individual verbal and written instruction to review signs/symptoms of diabetes, desired ranges of glucose level fasting, after meals and with exercise. Acknowledge that pre and post exercise glucose checks will be done for 3 sessions at entry of program.   Cardiac Rehab from 08/22/2019 in [facility] Cardiac and Pulmonary Rehab  Date  08/22/19  Educator  [facility]  Instruction Review Code  1- Verbalizes Understanding      Know Your Numbers and Risk Factors: -Group verbal and written instruction about important numbers in your health.  Discussion of what are risk factors and how they play a role in the disease process.  Review of Cholesterol, Blood Pressure, Diabetes, and BMI and the role they play in your overall health.   Sleep Hygiene: -Provides group verbal and written instruction about how sleep can affect your health.  Define sleep hygiene, discuss sleep cycles and impact of sleep habits. Review good sleep hygiene tips.    Other: -Provides group and verbal instruction on various topics (see comments)   Knowledge Questionnaire Score: Knowledge Questionnaire Score - 08/24/19 1446      Knowledge Questionnaire Score   Pre Score  24/26       Core Components/Risk Factors/Patient Goals at Admission: Personal Goals and Risk Factors at Admission - 08/24/19 1453      Core Components/Risk Factors/Patient Goals on Admission    Weight Management  Yes;Weight Loss    Intervention  Weight Management: Develop a combined nutrition and exercise program designed to reach desired caloric intake, while maintaining appropriate intake of nutrient  and fiber, sodium and fats, and appropriate energy expenditure required for the weight goal.;Weight Management: Provide education and appropriate resources to help participant work on and attain dietary goals.;Weight Management/Obesity: Establish reasonable short term and long term weight goals.    Admit Weight  191 lb 9.6 oz (86.9 kg)    Goal Weight: Short Term  180 lb (81.6 kg)    Goal Weight: Long Term  170 lb (77.1 kg)    Expected Outcomes  Short Term: Continue to assess and modify interventions until short term weight is achieved;Weight Maintenance: Understanding of the daily nutrition guidelines, which includes 25-35% calories from fat, 7% or less cal from saturated fats, less than '200mg'$  cholesterol, less than 1.5gm of sodium, & 5 or more servings of fruits and vegetables daily;Long Term: Adherence to nutrition and physical activity/exercise program aimed toward attainment of established weight goal;Weight Loss: Understanding of general recommendations for a balanced deficit meal plan, which promotes 1-2 lb weight loss per week and includes a negative energy balance of [phone] kcal/d;Understanding recommendations for meals to include 15-35% energy as  protein, 25-35% energy from fat, 35-60% energy from carbohydrates, less than '200mg'$  of dietary cholesterol, 20-35 gm of total fiber daily;Understanding of distribution of calorie intake throughout the day with the consumption of 4-5 meals/snacks    Intervention  Provide education about signs/symptoms and action to take for hypo/hyperglycemia.;Provide education about proper nutrition, including hydration, and aerobic/resistive exercise prescription along with prescribed medications to achieve blood glucose in normal ranges: Fasting glucose 65-99 mg/dL    Intervention  Provide education on lifestyle modifcations including regular physical activity/exercise, weight management, moderate sodium restriction and increased consumption of fresh fruit, vegetables, and  low fat dairy, alcohol moderation, and smoking cessation.;Monitor prescription use compliance.    Expected Outcomes  Short Term: Continued assessment and intervention until BP is < 140/71m HG in hypertensive participants. < 130/851mHG in hypertensive participants with diabetes, heart failure or chronic kidney disease.;Long Term: Maintenance of blood pressure at goal levels.    Intervention  Provide education and support for participant on nutrition & aerobic/resistive exercise along with prescribed medications to achieve LDL '70mg'$ , HDL >'40mg'$ .    Expected Outcomes  Long Term: Cholesterol controlled with medications as prescribed, with individualized exercise RX and with personalized nutrition plan. Value goals: LDL < '70mg'$ , HDL > 40 mg.;Short Term: Participant states understanding of desired cholesterol values and is compliant with medications prescribed. Participant is following exercise prescription and nutrition guidelines.       Core Components/Risk Factors/Patient Goals Review:    Core Components/Risk Factors/Patient Goals at Discharge (Final Review):    ITP Comments: ITP Comments    Row Name 08/22/19 1427 09/04/19 1716 09/06/19 0901       ITP Comments  [facility].  First full day of exercise!  Patient was oriented to gym and equipment including functions, settings, policies, and procedures.  Patient's individual exercise prescription and treatment plan were reviewed.  All starting workloads were established based on the results of the 6 minute walk test done at initial orientation visit.  The plan for exercise progression was also introduced and progression will be customized based on patient's performance and goals.  30 day review competed . ITP sent to Dr MaEmily Filbertor review, changes as needed and ITP approval signature  New to program        Comments:

## 2019-09-14 ENCOUNTER — Other Ambulatory Visit: Payer: Self-pay

## 2019-09-14 ENCOUNTER — Ambulatory Visit: Payer: PPO

## 2019-09-14 ENCOUNTER — Encounter: Payer: PPO | Attending: Cardiology | Admitting: *Deleted

## 2019-09-14 DIAGNOSIS — Z79899 Other long term (current) drug therapy: Secondary | ICD-10-CM | POA: Insufficient documentation

## 2019-09-14 DIAGNOSIS — J449 Chronic obstructive pulmonary disease, unspecified: Secondary | ICD-10-CM | POA: Diagnosis not present

## 2019-09-14 DIAGNOSIS — E039 Hypothyroidism, unspecified: Secondary | ICD-10-CM | POA: Diagnosis not present

## 2019-09-14 DIAGNOSIS — Z87891 Personal history of nicotine dependence: Secondary | ICD-10-CM | POA: Insufficient documentation

## 2019-09-14 DIAGNOSIS — Z955 Presence of coronary angioplasty implant and graft: Secondary | ICD-10-CM | POA: Diagnosis not present

## 2019-09-14 DIAGNOSIS — I10 Essential (primary) hypertension: Secondary | ICD-10-CM | POA: Insufficient documentation

## 2019-09-14 DIAGNOSIS — E119 Type 2 diabetes mellitus without complications: Secondary | ICD-10-CM | POA: Insufficient documentation

## 2019-09-14 DIAGNOSIS — K219 Gastro-esophageal reflux disease without esophagitis: Secondary | ICD-10-CM | POA: Insufficient documentation

## 2019-09-14 DIAGNOSIS — Z7984 Long term (current) use of oral hypoglycemic drugs: Secondary | ICD-10-CM | POA: Diagnosis not present

## 2019-09-14 DIAGNOSIS — D649 Anemia, unspecified: Secondary | ICD-10-CM | POA: Insufficient documentation

## 2019-09-14 DIAGNOSIS — Z7982 Long term (current) use of aspirin: Secondary | ICD-10-CM | POA: Insufficient documentation

## 2019-09-14 DIAGNOSIS — Z79890 Hormone replacement therapy (postmenopausal): Secondary | ICD-10-CM | POA: Diagnosis not present

## 2019-09-14 LAB — GLUCOSE, CAPILLARY
Glucose-Capillary: 114 mg/dL — ABNORMAL HIGH (ref 70–99)
Glucose-Capillary: 92 mg/dL (ref 70–99)

## 2019-09-14 NOTE — Progress Notes (Signed)
Daily Session Note  Patient Details  Name: Leslie Olsen MRN: [id number] Date of Birth: 1950/09/16 Referring Provider:     Cardiac Rehab from 08/24/2019 in [facility] Cardiac and Pulmonary Rehab  Referring Provider  Paraschos      Encounter Date: 09/14/2019  Check In: Session Check In - 09/14/19 0759      Check-In   Supervising physician immediately available to respond to emergencies  See telemetry face sheet for immediately available ER MD    Location  [facility]-Cardiac & Pulmonary Rehab    Staff Present  Heath Lark, RN, BSN, CCRP;Amanda Sommer, BA, ACSM CEP, Exercise Physiologist;Joseph Hood RCP,RRT,BSRT;Jessica [place], [place], RCEP, CCRP, CCET    Virtual Visit  No    Medication changes reported      No    Fall or balance concerns reported     No    Warm-up and Cool-down  Performed on first and last piece of equipment    Resistance Training Performed  Yes    VAD Patient?  No    PAD/SET Patient?  No      Pain Assessment   Currently in Pain?  No/denies          Social History   Tobacco Use  Smoking Status Former Smoker  . Packs/day: 2.00  . Years: 45.00  . Pack years: 90.00  . Types: Cigarettes  . Quit date: 11/06/2011  . Years since quitting: 7.8  Smokeless Tobacco Never Used    Goals Met:  Independence with exercise equipment Exercise tolerated well No report of cardiac concerns or symptoms  Goals Unmet:  Not Applicable  Comments: Pt able to follow exercise prescription today without complaint.  Will continue to monitor for progression.    Dr. Emily Filbert is Medical Director for [place] and [facility].

## 2019-09-18 ENCOUNTER — Ambulatory Visit: Payer: PPO

## 2019-09-19 DIAGNOSIS — E119 Type 2 diabetes mellitus without complications: Secondary | ICD-10-CM | POA: Diagnosis not present

## 2019-09-19 DIAGNOSIS — E039 Hypothyroidism, unspecified: Secondary | ICD-10-CM | POA: Diagnosis not present

## 2019-09-21 ENCOUNTER — Other Ambulatory Visit: Payer: Self-pay | Admitting: Family Medicine

## 2019-09-21 ENCOUNTER — Other Ambulatory Visit: Payer: Self-pay

## 2019-09-21 ENCOUNTER — Telehealth: Payer: Self-pay

## 2019-09-21 ENCOUNTER — Other Ambulatory Visit: Payer: Self-pay | Admitting: Internal Medicine

## 2019-09-21 DIAGNOSIS — Z1231 Encounter for screening mammogram for malignant neoplasm of breast: Secondary | ICD-10-CM

## 2019-09-21 DIAGNOSIS — Z955 Presence of coronary angioplasty implant and graft: Secondary | ICD-10-CM

## 2019-09-21 NOTE — Telephone Encounter (Signed)
Patient contacted office states she was last seen by Dr. Allen Norris in 2017. She is now experiencing "greasy bowel movements, uncontrolled". She said she has a history of Liver tumor, bleeding AVM's.  2017-2019 GI care was provided by [facility].  I asked patient why she changed care in 2017-she said she was very upset about a canceled appt at [facility], and needed help therefore she had to go to [facility] for immediate care. She would like to re-establish GI care with Dr. Allen Norris.  Informed her that we will need a new referral sent to the office.  She said she will let her PCP know to send [place] a referral.  Thanks Sharyn Lull

## 2019-09-21 NOTE — Progress Notes (Signed)
Completed Initial RD Evaluation 

## 2019-09-26 DIAGNOSIS — Z0000 Encounter for general adult medical examination without abnormal findings: Secondary | ICD-10-CM | POA: Diagnosis not present

## 2019-09-26 DIAGNOSIS — E039 Hypothyroidism, unspecified: Secondary | ICD-10-CM | POA: Diagnosis not present

## 2019-09-26 DIAGNOSIS — E119 Type 2 diabetes mellitus without complications: Secondary | ICD-10-CM | POA: Diagnosis not present

## 2019-09-26 DIAGNOSIS — G4733 Obstructive sleep apnea (adult) (pediatric): Secondary | ICD-10-CM | POA: Diagnosis not present

## 2019-09-26 DIAGNOSIS — I10 Essential (primary) hypertension: Secondary | ICD-10-CM | POA: Diagnosis not present

## 2019-09-26 DIAGNOSIS — E785 Hyperlipidemia, unspecified: Secondary | ICD-10-CM | POA: Diagnosis not present

## 2019-09-26 DIAGNOSIS — J449 Chronic obstructive pulmonary disease, unspecified: Secondary | ICD-10-CM | POA: Diagnosis not present

## 2019-09-26 DIAGNOSIS — K580 Irritable bowel syndrome with diarrhea: Secondary | ICD-10-CM | POA: Diagnosis not present

## 2019-09-26 MED ORDER — CLONAZEPAM 0.5 MG TABLET
ORAL_TABLET | Freq: Two times a day (BID) | ORAL | 0 refills | 90 days | Status: CP | PRN
Start: 2019-09-26 — End: ?

## 2019-09-28 ENCOUNTER — Ambulatory Visit: Payer: PPO

## 2019-09-28 ENCOUNTER — Other Ambulatory Visit: Payer: Self-pay

## 2019-09-28 ENCOUNTER — Encounter: Payer: PPO | Admitting: *Deleted

## 2019-09-28 DIAGNOSIS — Z955 Presence of coronary angioplasty implant and graft: Secondary | ICD-10-CM

## 2019-09-28 NOTE — Progress Notes (Signed)
Daily Session Note  Patient Details  Name: Leslie Olsen MRN: [id number] Date of Birth: Jan 28, 1951 Referring Provider:     Cardiac Rehab from 08/24/2019 in [facility] Cardiac and Pulmonary Rehab  Referring Provider  Paraschos      Encounter Date: 09/28/2019  Check In: Session Check In - 09/28/19 0755      Check-In   Supervising physician immediately available to respond to emergencies  See telemetry face sheet for immediately available ER MD    Location  [facility]-Cardiac & Pulmonary Rehab    Staff Present  Heath Lark, RN, BSN, CCRP;Jessica [place], [place], RCEP, CCRP, CCET;Joseph [place] RCP,RRT,BSRT    Virtual Visit  No    Medication changes reported      No    Fall or balance concerns reported     No    Warm-up and Cool-down  Performed on first and last piece of equipment    Resistance Training Performed  Yes    VAD Patient?  No    PAD/SET Patient?  No      Pain Assessment   Currently in Pain?  No/denies          Social History   Tobacco Use  Smoking Status Former Smoker  . Packs/day: 2.00  . Years: 45.00  . Pack years: 90.00  . Types: Cigarettes  . Quit date: 11/06/2011  . Years since quitting: 7.8  Smokeless Tobacco Never Used    Goals Met:  Independence with exercise equipment Exercise tolerated well No report of cardiac concerns or symptoms  Goals Unmet:  Not Applicable  Comments: Pt able to follow exercise prescription today without complaint.  Will continue to monitor for progression.    Dr. Emily Filbert is Medical Director for [place] and [facility].

## 2019-10-02 ENCOUNTER — Encounter: Payer: PPO | Admitting: *Deleted

## 2019-10-02 ENCOUNTER — Ambulatory Visit: Payer: PPO

## 2019-10-02 ENCOUNTER — Other Ambulatory Visit: Payer: Self-pay

## 2019-10-02 DIAGNOSIS — Z955 Presence of coronary angioplasty implant and graft: Secondary | ICD-10-CM | POA: Diagnosis not present

## 2019-10-02 NOTE — Progress Notes (Signed)
Daily Session Note  Patient Details  Name: Leslie Olsen MRN: [id number] Date of Birth: 11-Jan-1951 Referring Provider:     Cardiac Rehab from 08/24/2019 in [facility] Cardiac and Pulmonary Rehab  Referring Provider  Paraschos      Encounter Date: 10/02/2019  Check In: Session Check In - 10/02/19 1711      Check-In   Supervising physician immediately available to respond to emergencies  See telemetry face sheet for immediately available ER MD    Location  [facility]-Cardiac & Pulmonary Rehab    Staff Present  Nada Maclachlan, BA, ACSM CEP, Exercise Physiologist;Kelly Amedeo Plenty, BS, ACSM CEP, Exercise Physiologist;Lacrystal Barbe Sherryll Burger, RN BSN;Joseph Hood RCP,RRT,BSRT    Virtual Visit  No    Medication changes reported      No    Fall or balance concerns reported     No    Warm-up and Cool-down  Performed on first and last piece of equipment    Resistance Training Performed  Yes    VAD Patient?  No    PAD/SET Patient?  No      Pain Assessment   Currently in Pain?  No/denies          Social History   Tobacco Use  Smoking Status Former Smoker  . Packs/day: 2.00  . Years: 45.00  . Pack years: 90.00  . Types: Cigarettes  . Quit date: 11/06/2011  . Years since quitting: 7.9  Smokeless Tobacco Never Used    Goals Met:  Independence with exercise equipment Exercise tolerated well No report of cardiac concerns or symptoms Strength training completed today  Goals Unmet:  Not Applicable  Comments: Pt able to follow exercise prescription today without complaint.  Will continue to monitor for progression.    Dr. Emily Filbert is Medical Director for [place] and [facility].

## 2019-10-04 ENCOUNTER — Encounter: Payer: Self-pay | Admitting: *Deleted

## 2019-10-04 DIAGNOSIS — Z955 Presence of coronary angioplasty implant and graft: Secondary | ICD-10-CM

## 2019-10-04 NOTE — Progress Notes (Signed)
Cardiac Individual Treatment Plan  Patient Details  Name: Leslie Olsen MRN: [id number] Date of Birth: [birth year] Referring Provider:     Cardiac Rehab from 08/24/2019 in [facility] Cardiac and Pulmonary Rehab  Referring Provider  Paraschos      Initial Encounter Date:    Cardiac Rehab from 08/24/2019 in [facility] Cardiac and Pulmonary Rehab  Date  08/24/19      Visit Diagnosis: Status post coronary artery stent placement  Patient's Home Medications on Admission:  Current Outpatient Medications:  .  aspirin 81 MG tablet, Take 81 mg by mouth daily., Disp: , Rfl:  .  clonazePAM (KLONOPIN) 0.5 MG tablet, Take 0.5 mg by mouth at bedtime. , Disp: , Rfl:  .  ferrous gluconate (FERGON) 324 MG tablet, Take 324 mg by mouth daily., Disp: , Rfl: 11 .  glucose blood (ONE TOUCH ULTRA TEST) test strip, USE ONE STRIP TO CHECK GLUCOSE TWICE DAILY, Disp: 100 each, Rfl: 12 .  levETIRAcetam (KEPPRA) 500 MG tablet, Take 500 mg by mouth 2 (two) times daily. , Disp: , Rfl:  .  lisinopril (ZESTRIL) 5 MG tablet, Take 1 tablet (5 mg total) by mouth daily. (Patient taking differently: Take 10 mg by mouth daily. ), Disp: 90 tablet, Rfl: 1 .  metFORMIN (GLUCOPHAGE) 500 MG tablet, Take 1 tablet (500 mg total) by mouth daily with breakfast. (Patient taking differently: Take 1,000 mg by mouth 2 (two) times daily with a meal. ), Disp: 90 tablet, Rfl: 1 .  oxybutynin (DITROPAN XL) 15 MG 24 hr tablet, Take 1 tablet (15 mg total) by mouth daily. (Patient taking differently: Take 15 mg by mouth at bedtime. ), Disp: 30 tablet, Rfl: 11 .  pantoprazole (PROTONIX) 40 MG tablet, Take 40 mg by mouth daily as needed (Heartburn). , Disp: , Rfl: 3 .  prasugrel (EFFIENT) 10 MG TABS tablet, Take 1 tablet (10 mg total) by mouth daily., Disp: 30 tablet, Rfl: 5 .  SUMAtriptan (IMITREX) 100 MG tablet, Take 1 tablet (100 mg total) by mouth every 2 (two) hours as needed for migraine. May repeat in 2 hours if headache persists or recurs., Disp: 10  tablet, Rfl: 12 .  SYNTHROID 100 MCG tablet, Take 1 tablet (100 mcg total) by mouth daily before breakfast., Disp: 90 tablet, Rfl: 3  Past Medical History: Past Medical History:  Diagnosis Date  . Anemia   . Cephalalgia 04/08/2015  . Chronic headaches   . COPD (chronic obstructive pulmonary disease) ([place])   . Diabetes mellitus without complication ([place])   . Fatigue due to excessive exertion 08/22/2018  . GERD (gastroesophageal reflux disease)   . Hypertension   . Hypothyroidism   . [facility] spotted fever) 07/27/2016    Tobacco Use: Social History   Tobacco Use  Smoking Status Former Smoker  . Packs/day: 2.00  . Years: 45.00  . Pack years: 90.00  . Types: Cigarettes  . Quit date: 11/06/2011  . Years since quitting: 7.9  Smokeless Tobacco Never Used    Labs: Recent Review Flowsheet Data    Labs for ITP Cardiac and Pulmonary Rehab Latest Ref Rng & Units 09/01/2017 12/28/2017 06/29/2018 10/07/2018 01/06/2019   Cholestrol 100 - 199 mg/dL - 265(H) 245(H) - 266(H)   LDLCALC 0 - 99 mg/dL - 178(H) 154(H) - 184(H)   HDL >39 mg/dL - 47 42 - 43   Trlycerides 0 - 149 mg/dL - 198(H) 246(H) - 194(H)   Hemoglobin A1c <7.0 % 6.1 5.8 5.9(H) 6.0 6.1  Exercise Target Goals: Exercise Program Goal: Individual exercise prescription set using results from initial 6 min walk test and THRR while considering  patient's activity barriers and safety.   Exercise Prescription Goal: Initial exercise prescription builds to 30-45 minutes a day of aerobic activity, 2-3 days per week.  Home exercise guidelines will be given to patient during program as part of exercise prescription that the participant will acknowledge.  Activity Barriers & Risk Stratification:   6 Minute Walk: 6 Minute Walk    Row Name 08/24/19 1429         6 Minute Walk   Phase  Initial     Distance  1025 feet     Walk Time  6 minutes     # of Rest Breaks  0     MPH  1.94     METS  2.63     RPE  13      Perceived Dyspnea   1     VO2 Peak  9.22     Symptoms  No     Resting HR  104 bpm     Resting BP  128/68     Resting Oxygen Saturation   98 %     Exercise Oxygen Saturation  during 6 min walk  97 %     Max Ex. HR  131 bpm     Max Ex. BP  152/64     2 Minute Post BP  124/64        Oxygen Initial Assessment:   Oxygen Re-Evaluation:   Oxygen Discharge (Final Oxygen Re-Evaluation):   Initial Exercise Prescription: Initial Exercise Prescription - 08/24/19 1400      Date of Initial Exercise RX and Referring Provider   Date  08/24/19    Referring Provider  Paraschos      Treadmill   MPH  1.9    Grade  0.5    Minutes  15    METs  2.6      Recumbant Bike   Level  2    RPM  60    Minutes  15    METs  2.6      NuStep   Level  3    SPM  80    Minutes  15    METs  2.6      REL-XR   Level  3    Speed  50    Minutes  15    METs  2.6      T5 Nustep   Level  2    SPM  80    Minutes  15    METs  2.6      Prescription Details   Frequency (times per week)  3    Duration  Progress to 30 minutes of continuous aerobic without signs/symptoms of physical distress      Intensity   THRR 40-80% of Max Heartrate  122-141    Ratings of Perceived Exertion  11-15    Perceived Dyspnea  0-4      Resistance Training   Training Prescription  Yes    Weight  3 lb    Reps  10-15       Perform Capillary Blood Glucose checks as needed.  Exercise Prescription Changes: Exercise Prescription Changes    Row Name 08/24/19 1400 09/07/19 1200 09/21/19 1600 10/02/19 1500       Response to Exercise   Blood Pressure (Admit)  128/68  138/64  122/60  124/70  Blood Pressure (Exercise)  152/64  172/66  156/66  154/66    Blood Pressure (Exit)  124/64  142/62  146/74  118/60    Heart Rate (Admit)  104 bpm  103 bpm  81 bpm  80 bpm    Heart Rate (Exercise)  131 bpm  125 bpm  126 bpm  116 bpm    Heart Rate (Exit)  73 bpm  97 bpm  92 bpm  91 bpm    Oxygen Saturation (Admit)  98 %  --   --  --    Oxygen Saturation (Exercise)  97 %  --  --  --    Rating of Perceived Exertion (Exercise)  _0 Perceived Dyspnea (Exercise)  1  --  --  --    Symptoms  --  none  none  none    Comments  --  1st day  second full day of exercise  --    Duration  --  --  Continue with 30 min of aerobic exercise without signs/symptoms of physical distress.  Continue with 30 min of aerobic exercise without signs/symptoms of physical distress.    Intensity  --  --  THRR unchanged  THRR unchanged      Progression   Progression  --  --  Continue to progress workloads to maintain intensity without signs/symptoms of physical distress.  Continue to progress workloads to maintain intensity without signs/symptoms of physical distress.    Average METs  --  --  2.76  2.76      Resistance Training   Training Prescription  --  Yes  Yes  Yes    Weight  --  3 lb  3 lb  3 lb    Reps  --  10-15  10-15  10-15      Interval Training   Interval Training  --  --  No  No      Treadmill   MPH  --  --  2  --    Grade  --  --  2.5  --    Minutes  --  --  15  --    METs  --  --  3.22  --      NuStep   Level  --  --  1  1    SPM  --  --  --  80    Minutes  --  --  15  15    METs  --  --  2.3  2.4      Arm Ergometer   Level  --  1  --  --    Minutes  --  15  --  --    METs  --  2.5  --  --      T5 Nustep   Level  --  1  --  --    SPM  --  80  --  --    Minutes  --  15  --  --    METs  --  2.5  --  --      Biostep-RELP   Level  --  --  --  1    SPM  --  --  --  50    Minutes  --  --  --  15    METs  --  --  --  2       Exercise Comments:   Exercise  Goals and Review: Exercise Goals    Row Name 08/24/19 1442             Exercise Goals   Increase Physical Activity  Yes       Intervention  Provide advice, education, support and counseling about physical activity/exercise needs.;Develop an individualized exercise prescription for aerobic and resistive training based on initial  evaluation findings, risk stratification, comorbidities and participant's personal goals.       Expected Outcomes  Short Term: Attend rehab on a regular basis to increase amount of physical activity.;Long Term: Add in home exercise to make exercise part of routine and to increase amount of physical activity.;Long Term: Exercising regularly at least 3-5 days a week.       Increase Strength and Stamina  Yes       Intervention  Provide advice, education, support and counseling about physical activity/exercise needs.;Develop an individualized exercise prescription for aerobic and resistive training based on initial evaluation findings, risk stratification, comorbidities and participant's personal goals.       Expected Outcomes  Short Term: Increase workloads from initial exercise prescription for resistance, speed, and METs.;Short Term: Perform resistance training exercises routinely during rehab and add in resistance training at home;Long Term: Improve cardiorespiratory fitness, muscular endurance and strength as measured by increased METs and functional capacity (6MWT)       Able to understand and use rate of perceived exertion (RPE) scale  Yes       Intervention  Provide education and explanation on how to use RPE scale       Expected Outcomes  Short Term: Able to use RPE daily in rehab to express subjective intensity level;Long Term:  Able to use RPE to guide intensity level when exercising independently       Knowledge and understanding of Target Heart Rate Range (THRR)  Yes       Intervention  Provide education and explanation of THRR including how the numbers were predicted and where they are located for reference       Expected Outcomes  Short Term: Able to state/look up THRR;Short Term: Able to use daily as guideline for intensity in rehab;Long Term: Able to use THRR to govern intensity when exercising independently       Able to check pulse independently  Yes       Intervention  Provide education  and demonstration on how to check pulse in carotid and radial arteries.;Review the importance of being able to check your own pulse for safety during independent exercise       Expected Outcomes  Short Term: Able to explain why pulse checking is important during independent exercise;Long Term: Able to check pulse independently and accurately       Understanding of Exercise Prescription  Yes       Intervention  Provide education, explanation, and written materials on patient's individual exercise prescription       Expected Outcomes  Short Term: Able to explain program exercise prescription;Long Term: Able to explain home exercise prescription to exercise independently          Exercise Goals Re-Evaluation : Exercise Goals Re-Evaluation    Row Name 09/04/19 1718 09/21/19 1624 09/28/19 0751         Exercise Goal Re-Evaluation   Exercise Goals Review  Increase Physical Activity;Increase Strength and Stamina;Able to understand and use rate of perceived exertion (RPE) scale;Knowledge and understanding of Target Heart Rate Range (THRR);Able to check pulse independently;Understanding of Exercise Prescription  Increase Physical  Activity;Increase Strength and Stamina;Understanding of Exercise Prescription  Increase Physical Activity;Increase Strength and Stamina     Comments  Reviewed RPE scale, THR and program prescription with pt today.  Pt voiced understanding and was given a copy of goals to take home.  Leslie Olsen is off to a good start in rehab. She has completed her first two sessions.  We will continue to encourage her to progress.  Leslie Olsen is going to the track to walk. She is almost walking a mile two days a week. She gets out of breath a little and takes breaks as needed. She wants to keep up her stamina and keep doing cardio to keep from having blockages in her heart.     Expected Outcomes  Short: Use RPE daily to regulate intensity. Long: Follow program prescription in THR.  Short: Attend regularly.   Long: Continue to follow program prescription.  Short: continue to walk outside of rehab. Long: join a gym or follow an exercise program for home.        Discharge Exercise Prescription (Final Exercise Prescription Changes): Exercise Prescription Changes - 10/02/19 1500      Response to Exercise   Blood Pressure (Admit)  124/70    Blood Pressure (Exercise)  154/66    Blood Pressure (Exit)  118/60    Heart Rate (Admit)  80 bpm    Heart Rate (Exercise)  116 bpm    Heart Rate (Exit)  91 bpm    Rating of Perceived Exertion (Exercise)  13    Symptoms  none    Duration  Continue with 30 min of aerobic exercise without signs/symptoms of physical distress.    Intensity  THRR unchanged      Progression   Progression  Continue to progress workloads to maintain intensity without signs/symptoms of physical distress.    Average METs  2.76      Resistance Training   Training Prescription  Yes    Weight  3 lb    Reps  10-15      Interval Training   Interval Training  No      NuStep   Level  1    SPM  80    Minutes  15    METs  2.4      Biostep-RELP   Level  1    SPM  50    Minutes  15    METs  2       Nutrition:  Target Goals: Understanding of nutrition guidelines, daily intake of sodium '1500mg'$ , cholesterol '200mg'$ , calories 30% from fat and 7% or less from saturated fats, daily to have 5 or more servings of fruits and vegetables.  Biometrics: Pre Biometrics - 08/24/19 1442      Pre Biometrics   Height  5' 4.75" (1.645 m)    Weight  191 lb 9.6 oz (86.9 kg)    BMI (Calculated)  32.12    Single Leg Stand  4.78 seconds        Nutrition Therapy Plan and Nutrition Goals: Nutrition Therapy & Goals - 09/21/19 1402      Nutrition Therapy   Diet  HH, DM, Low Na diet    Protein (specify units)  70g    Fiber  30 grams    Whole Grain Foods  3 servings    Saturated Fats  12 max. grams    Fruits and Vegetables  5 servings/day    Sodium  1.5 grams      Personal Nutrition  Goals   Nutrition Goal  ST: LT: Maintaining functional years, prevent 50% blockage from going to 70% in two arteries    Comments  A1C is 6.2 - BG stable. B: oatmeal with apple L: sometimes skipped (sandwich with lunch meat or grilled on sandwich thins multigrain or peanut butter and banana sandwiches). D: vegetable plate at [employer] or canned soup or scrambled eggs with grits (pt reports not eating a lot of meat). Discussed HH and DM friendly eating. Gave suggestions such as adding protein/fat to breakfast such as nuts or seeds, eating lunch consistenly, eat protein at dinner when not having eggs such as beans or fish like canned tuna. Pt reports not wanting to make ST goals right now due to persistent steatorrhea - pt reports that during testing her pancreas was of concern - suspect she may have some pancreatic insufficiency. Encouraged pt to contact doctor as soon as she can (pt was already trying). Will follow up.      Intervention Plan   Intervention  Prescribe, educate and counsel regarding individualized specific dietary modifications aiming towards targeted core components such as weight, hypertension, lipid management, diabetes, heart failure and other comorbidities.;Nutrition handout(s) given to patient.    Expected Outcomes  Short Term Goal: Understand basic principles of dietary content, such as calories, fat, sodium, cholesterol and nutrients.;Short Term Goal: A plan has been developed with personal nutrition goals set during dietitian appointment.;Long Term Goal: Adherence to prescribed nutrition plan.       Nutrition Assessments:   Nutrition Goals Re-Evaluation:   Nutrition Goals Discharge (Final Nutrition Goals Re-Evaluation):   Psychosocial: Target Goals: Acknowledge presence or absence of significant depression and/or stress, maximize coping skills, provide positive support system. Participant is able to verbalize types and ability to use techniques and skills needed for reducing  stress and depression.   Initial Review & Psychosocial Screening: Initial Psych Review & Screening - 08/22/19 1417      Initial Review   Current issues with  Current Stress Concerns    Comments  Patient is mostly stressed due to [place]. She wears her mask and tries to protect herself.      Family Dynamics   Good Support System?  Yes    Comments  She looks to her son and grand daughter for support.      Barriers   Psychosocial barriers to participate in program  The patient should benefit from training in stress management and relaxation.      Screening Interventions   Interventions  Encouraged to exercise;Provide feedback about the scores to participant;To provide support and resources with identified psychosocial needs    Expected Outcomes  Short Term goal: Utilizing psychosocial counselor, staff and physician to assist with identification of specific Stressors or current issues interfering with healing process. Setting desired goal for each stressor or current issue identified.;Long Term Goal: Stressors or current issues are controlled or eliminated.;Long Term goal: The participant improves quality of Life and PHQ9 Scores as seen by post scores and/or verbalization of changes;Short Term goal: Identification and review with participant of any Quality of Life or Depression concerns found by scoring the questionnaire.       Quality of Life Scores:  Quality of Life - 08/24/19 1447      Quality of Life   Select  Quality of Life      Quality of Life Scores   Health/Function Pre  27.39 %    Socioeconomic Pre  30 %    Psych/Spiritual Pre  28.29 %  Family Pre  25.5 %    GLOBAL Pre  27.78 %      Scores of 19 and below usually indicate a poorer quality of life in these areas.  A difference of  2-3 points is a clinically meaningful difference.  A difference of 2-3 points in the total score of the Quality of Life Index has been associated with significant improvement in overall quality of  life, self-image, physical symptoms, and general health in studies assessing change in quality of life.  PHQ-9: Recent Review Flowsheet Data    Depression screen [facility] 2/9 08/24/2019 10/07/2018 09/09/2017 09/03/2016 02/21/2016   Decreased Interest 0 0 0 0 0   Down, Depressed, Hopeless 0 0 0 0 0   PHQ - 2 Score 0 0 0 0 0   Altered sleeping 1 1 - 2 -   Tired, decreased energy 1 3 - 1 -   Change in appetite 2 1 - 3 -   Feeling bad or failure about yourself  0 0 - 0 -   Trouble concentrating 0 0 - 0 -   Moving slowly or fidgety/restless 0 1 - 1 -   Suicidal thoughts 0 0 - 0 -   PHQ-9 Score 4 6 - 7 -   Difficult doing work/chores Somewhat difficult Very difficult - - -     Interpretation of Total Score  Total Score Depression Severity:  1-4 = Minimal depression, 5-9 = Mild depression, 10-14 = Moderate depression, 15-19 = Moderately severe depression, 20-27 = Severe depression   Psychosocial Evaluation and Intervention: Psychosocial Evaluation - 08/22/19 1421      Psychosocial Evaluation & Interventions   Interventions  Encouraged to exercise with the program and follow exercise prescription    Comments  Patient is mostly stressed due to [place]. She wears her mask and tries to protect herself. She is ready to exercise and get herself in shape and has a positive attitude.    Expected Outcomes  Short: Attend HeartTrack stress management education to decrease stress. Long: Maintain exercise Post HeartTrack to keep stress at a minimum    Continue Psychosocial Services   Follow up required by staff       Psychosocial Re-Evaluation: Psychosocial Re-Evaluation    [place] Name 09/28/19 0754             Psychosocial Re-Evaluation   Current issues with  Current Stress Concerns;Current Sleep Concerns       Comments  Leslie Olsen has not been sleeping well lately. She needs her Kepra and Klonopin refilled. She is going to check her pharmacy today to see if they are filled. She is out of blood pressure  medicine as well. The doctor send out new prescriptions tuesday morning and is still not in.       Expected Outcomes  Short: go to Pharmacy and pick up prescriptions to help sleep. Long: continue to get medications regularly to prevent sleep and stress issues.       Interventions  Encouraged to attend Cardiac Rehabilitation for the exercise       Continue Psychosocial Services   Follow up required by staff          Psychosocial Discharge (Final Psychosocial Re-Evaluation): Psychosocial Re-Evaluation - 09/28/19 0754      Psychosocial Re-Evaluation   Current issues with  Current Stress Concerns;Current Sleep Concerns    Comments  Leslie Olsen has not been sleeping well lately. She needs her Kepra and Klonopin refilled. She is going to check her pharmacy  today to see if they are filled. She is out of blood pressure medicine as well. The doctor send out new prescriptions tuesday morning and is still not in.    Expected Outcomes  Short: go to Pharmacy and pick up prescriptions to help sleep. Long: continue to get medications regularly to prevent sleep and stress issues.    Interventions  Encouraged to attend Cardiac Rehabilitation for the exercise    Continue Psychosocial Services   Follow up required by staff       Vocational Rehabilitation: Provide vocational rehab assistance to qualifying candidates.   Vocational Rehab Evaluation & Intervention:   Education: Education Goals: Education classes will be provided on a variety of topics geared toward better understanding of heart health and risk factor modification. Participant will state understanding/return demonstration of topics presented as noted by education test scores.  Learning Barriers/Preferences: Learning Barriers/Preferences - 08/22/19 1419      Learning Barriers/Preferences   Learning Barriers  None    Learning Preferences  None       Education Topics:  AED/CPR: - Group verbal and written instruction with the use of models to  demonstrate the basic use of the AED with the basic ABC's of resuscitation.   General Nutrition Guidelines/Fats and Fiber: -Group instruction provided by verbal, written material, models and posters to present the general guidelines for heart healthy nutrition. Gives an explanation and review of dietary fats and fiber.   Controlling Sodium/Reading Food Labels: -Group verbal and written material supporting the discussion of sodium use in heart healthy nutrition. Review and explanation with models, verbal and written materials for utilization of the food label.   Exercise Physiology & General Exercise Guidelines: - Group verbal and written instruction with models to review the exercise physiology of the cardiovascular system and associated critical values. Provides general exercise guidelines with specific guidelines to those with heart or lung disease.    Cardiac Rehab from 08/24/2019 in [facility] Cardiac and Pulmonary Rehab  Date  08/24/19  Educator  AS  Instruction Review Code  1- Verbalizes Understanding      Aerobic Exercise & Resistance Training: - Gives group verbal and written instruction on the various components of exercise. Focuses on aerobic and resistive training programs and the benefits of this training and how to safely progress through these programs..   Flexibility, Balance, Mind/Body Relaxation: Provides group verbal/written instruction on the benefits of flexibility and balance training, including mind/body exercise modes such as yoga, pilates and tai chi.  Demonstration and skill practice provided.   Stress and Anxiety: - Provides group verbal and written instruction about the health risks of elevated stress and causes of high stress.  Discuss the correlation between heart/lung disease and anxiety and treatment options. Review healthy ways to manage with stress and anxiety.   Depression: - Provides group verbal and written instruction on the correlation between  heart/lung disease and depressed mood, treatment options, and the stigmas associated with seeking treatment.   Anatomy & Physiology of the Heart: - Group verbal and written instruction and models provide basic cardiac anatomy and physiology, with the coronary electrical and arterial systems. Review of Valvular disease and Heart Failure   Cardiac Procedures: - Group verbal and written instruction to review commonly prescribed medications for heart disease. Reviews the medication, class of the drug, and side effects. Includes the steps to properly store meds and maintain the prescription regimen. (beta blockers and nitrates)   Cardiac Medications I: - Group verbal and written instruction to review  commonly prescribed medications for heart disease. Reviews the medication, class of the drug, and side effects. Includes the steps to properly store meds and maintain the prescription regimen.   Cardiac Medications II: -Group verbal and written instruction to review commonly prescribed medications for heart disease. Reviews the medication, class of the drug, and side effects. (all other drug classes)    Go Sex-Intimacy & Heart Disease, Get SMART - Goal Setting: - Group verbal and written instruction through game format to discuss heart disease and the return to sexual intimacy. Provides group verbal and written material to discuss and apply goal setting through the application of the S.M.A.R.T. Method.   Other Matters of the Heart: - Provides group verbal, written materials and models to describe Stable Angina and Peripheral Artery. Includes description of the disease process and treatment options available to the cardiac patient.   Exercise & Equipment Safety: - Individual verbal instruction and demonstration of equipment use and safety with use of the equipment.   Cardiac Rehab from 08/24/2019 in [facility] Cardiac and Pulmonary Rehab  Date  08/24/19  Educator  AS  Instruction Review Code  1-  Verbalizes Understanding      Infection Prevention: - Provides verbal and written material to individual with discussion of infection control including proper hand washing and proper equipment cleaning during exercise session.   Cardiac Rehab from 08/24/2019 in [facility] Cardiac and Pulmonary Rehab  Date  08/24/19  Educator  AS  Instruction Review Code  1- Verbalizes Understanding      Falls Prevention: - Provides verbal and written material to individual with discussion of falls prevention and safety.   Cardiac Rehab from 08/24/2019 in [facility] Cardiac and Pulmonary Rehab  Date  08/24/19  Educator  AS  Instruction Review Code  1- Verbalizes Understanding      Diabetes: - Individual verbal and written instruction to review signs/symptoms of diabetes, desired ranges of glucose level fasting, after meals and with exercise. Acknowledge that pre and post exercise glucose checks will be done for 3 sessions at entry of program.   Cardiac Rehab from 08/22/2019 in [facility] Cardiac and Pulmonary Rehab  Date  08/22/19  Educator  [facility]  Instruction Review Code  1- Verbalizes Understanding      Know Your Numbers and Risk Factors: -Group verbal and written instruction about important numbers in your health.  Discussion of what are risk factors and how they play a role in the disease process.  Review of Cholesterol, Blood Pressure, Diabetes, and BMI and the role they play in your overall health.   Sleep Hygiene: -Provides group verbal and written instruction about how sleep can affect your health.  Define sleep hygiene, discuss sleep cycles and impact of sleep habits. Review good sleep hygiene tips.    Other: -Provides group and verbal instruction on various topics (see comments)   Knowledge Questionnaire Score: Knowledge Questionnaire Score - 08/24/19 1446      Knowledge Questionnaire Score   Pre Score  24/26       Core Components/Risk Factors/Patient Goals at Admission: Personal Goals and  Risk Factors at Admission - 08/24/19 1453      Core Components/Risk Factors/Patient Goals on Admission    Weight Management  Yes;Weight Loss    Intervention  Weight Management: Develop a combined nutrition and exercise program designed to reach desired caloric intake, while maintaining appropriate intake of nutrient and fiber, sodium and fats, and appropriate energy expenditure required for the weight goal.;Weight Management: Provide education and appropriate resources to  help participant work on and attain dietary goals.;Weight Management/Obesity: Establish reasonable short term and long term weight goals.    Admit Weight  191 lb 9.6 oz (86.9 kg)    Goal Weight: Short Term  180 lb (81.6 kg)    Goal Weight: Long Term  170 lb (77.1 kg)    Expected Outcomes  Short Term: Continue to assess and modify interventions until short term weight is achieved;Weight Maintenance: Understanding of the daily nutrition guidelines, which includes 25-35% calories from fat, 7% or less cal from saturated fats, less than '200mg'$  cholesterol, less than 1.5gm of sodium, & 5 or more servings of fruits and vegetables daily;Long Term: Adherence to nutrition and physical activity/exercise program aimed toward attainment of established weight goal;Weight Loss: Understanding of general recommendations for a balanced deficit meal plan, which promotes 1-2 lb weight loss per week and includes a negative energy balance of [phone] kcal/d;Understanding recommendations for meals to include 15-35% energy as protein, 25-35% energy from fat, 35-60% energy from carbohydrates, less than '200mg'$  of dietary cholesterol, 20-35 gm of total fiber daily;Understanding of distribution of calorie intake throughout the day with the consumption of 4-5 meals/snacks    Intervention  Provide education about signs/symptoms and action to take for hypo/hyperglycemia.;Provide education about proper nutrition, including hydration, and aerobic/resistive exercise  prescription along with prescribed medications to achieve blood glucose in normal ranges: Fasting glucose 65-99 mg/dL    Intervention  Provide education on lifestyle modifcations including regular physical activity/exercise, weight management, moderate sodium restriction and increased consumption of fresh fruit, vegetables, and low fat dairy, alcohol moderation, and smoking cessation.;Monitor prescription use compliance.    Expected Outcomes  Short Term: Continued assessment and intervention until BP is < 140/59m HG in hypertensive participants. < 130/81mHG in hypertensive participants with diabetes, heart failure or chronic kidney disease.;Long Term: Maintenance of blood pressure at goal levels.    Intervention  Provide education and support for participant on nutrition & aerobic/resistive exercise along with prescribed medications to achieve LDL '70mg'$ , HDL >'40mg'$ .    Expected Outcomes  Long Term: Cholesterol controlled with medications as prescribed, with individualized exercise RX and with personalized nutrition plan. Value goals: LDL < '70mg'$ , HDL > 40 mg.;Short Term: Participant states understanding of desired cholesterol values and is compliant with medications prescribed. Participant is following exercise prescription and nutrition guidelines.       Core Components/Risk Factors/Patient Goals Review:  Goals and Risk Factor Review    Row Name 09/28/19 0800             Core Components/Risk Factors/Patient Goals Review   Personal Goals Review  Weight Management/Obesity;Hypertension;Lipids;Diabetes       Review  DoKeairraad her A1C checked and it was 6.6. She is working on getting her blood pressure medications refilled. She is checking her blood sugar at home regularly. She checks hre blood pressure at home and has been stable. She has not lost weight since the start of the program but the program has been moved to once every other week.       Expected Outcomes  Short: lose weight in two weeks.  Long: Lose 20 pounds.          Core Components/Risk Factors/Patient Goals at Discharge (Final Review):  Goals and Risk Factor Review - 09/28/19 0800      Core Components/Risk Factors/Patient Goals Review   Personal Goals Review  Weight Management/Obesity;Hypertension;Lipids;Diabetes    Review  DoBrittanead her A1C checked and it was 6.6. She is working  on getting her blood pressure medications refilled. She is checking her blood sugar at home regularly. She checks hre blood pressure at home and has been stable. She has not lost weight since the start of the program but the program has been moved to once every other week.    Expected Outcomes  Short: lose weight in two weeks. Long: Lose 20 pounds.       ITP Comments: ITP Comments    Row Name 08/22/19 1427 09/04/19 1716 09/06/19 0901 09/21/19 1435 10/04/19 1422   ITP Comments  Diagnosis can be found in [facility] 07/20/2019 S/P Coronary Artery Stent.  First full day of exercise!  Patient was oriented to gym and equipment including functions, settings, policies, and procedures.  Patient's individual exercise prescription and treatment plan were reviewed.  All starting workloads were established based on the results of the 6 minute walk test done at initial orientation visit.  The plan for exercise progression was also introduced and progression will be customized based on patient's performance and goals.  30 day review competed . ITP sent to Dr Emily Filbert for review, changes as needed and ITP approval signature  New to program  Completed Initial RD Eval  30 day review completed. ITP sent to Dr. Emily Filbert, Medical Director of Cardiac and Pulmonary Rehab. Continue with ITP unless changes are made by physician.  Department operating under reduced schedule until further notice by request from hospital leadership.      Comments: 30 day review

## 2019-10-09 ENCOUNTER — Encounter: Payer: PPO | Attending: Cardiology | Admitting: *Deleted

## 2019-10-09 ENCOUNTER — Other Ambulatory Visit: Payer: Self-pay

## 2019-10-09 DIAGNOSIS — Z7982 Long term (current) use of aspirin: Secondary | ICD-10-CM | POA: Insufficient documentation

## 2019-10-09 DIAGNOSIS — Z955 Presence of coronary angioplasty implant and graft: Secondary | ICD-10-CM | POA: Insufficient documentation

## 2019-10-09 DIAGNOSIS — I10 Essential (primary) hypertension: Secondary | ICD-10-CM | POA: Insufficient documentation

## 2019-10-09 DIAGNOSIS — D649 Anemia, unspecified: Secondary | ICD-10-CM | POA: Insufficient documentation

## 2019-10-09 DIAGNOSIS — Z79899 Other long term (current) drug therapy: Secondary | ICD-10-CM | POA: Insufficient documentation

## 2019-10-09 DIAGNOSIS — J449 Chronic obstructive pulmonary disease, unspecified: Secondary | ICD-10-CM | POA: Insufficient documentation

## 2019-10-09 DIAGNOSIS — Z7984 Long term (current) use of oral hypoglycemic drugs: Secondary | ICD-10-CM | POA: Insufficient documentation

## 2019-10-09 DIAGNOSIS — E039 Hypothyroidism, unspecified: Secondary | ICD-10-CM | POA: Insufficient documentation

## 2019-10-09 DIAGNOSIS — Z79890 Hormone replacement therapy (postmenopausal): Secondary | ICD-10-CM | POA: Insufficient documentation

## 2019-10-09 DIAGNOSIS — K219 Gastro-esophageal reflux disease without esophagitis: Secondary | ICD-10-CM | POA: Insufficient documentation

## 2019-10-09 DIAGNOSIS — Z87891 Personal history of nicotine dependence: Secondary | ICD-10-CM | POA: Insufficient documentation

## 2019-10-09 DIAGNOSIS — E119 Type 2 diabetes mellitus without complications: Secondary | ICD-10-CM | POA: Insufficient documentation

## 2019-10-09 NOTE — Progress Notes (Signed)
Follow up call completed. Leslie Olsen has had a hard week given that she has lost 3 friends in one week. She is trying to focus on self care but it has been hard. She was happy to hear that we can start having patients 2 days a week instead of every other week. She is trying to stay active but looking forward to a more normal "set" schedule

## 2019-10-16 ENCOUNTER — Ambulatory Visit: Payer: PPO

## 2019-10-18 ENCOUNTER — Encounter: Payer: Self-pay | Admitting: *Deleted

## 2019-10-18 DIAGNOSIS — Z955 Presence of coronary angioplasty implant and graft: Secondary | ICD-10-CM

## 2019-10-23 ENCOUNTER — Encounter: Payer: PPO | Admitting: *Deleted

## 2019-10-23 ENCOUNTER — Other Ambulatory Visit: Payer: Self-pay

## 2019-10-23 DIAGNOSIS — J449 Chronic obstructive pulmonary disease, unspecified: Secondary | ICD-10-CM | POA: Diagnosis not present

## 2019-10-23 DIAGNOSIS — D649 Anemia, unspecified: Secondary | ICD-10-CM | POA: Diagnosis not present

## 2019-10-23 DIAGNOSIS — I10 Essential (primary) hypertension: Secondary | ICD-10-CM | POA: Diagnosis not present

## 2019-10-23 DIAGNOSIS — Z87891 Personal history of nicotine dependence: Secondary | ICD-10-CM | POA: Diagnosis not present

## 2019-10-23 DIAGNOSIS — Z7984 Long term (current) use of oral hypoglycemic drugs: Secondary | ICD-10-CM | POA: Diagnosis not present

## 2019-10-23 DIAGNOSIS — K219 Gastro-esophageal reflux disease without esophagitis: Secondary | ICD-10-CM | POA: Diagnosis not present

## 2019-10-23 DIAGNOSIS — Z7982 Long term (current) use of aspirin: Secondary | ICD-10-CM | POA: Diagnosis not present

## 2019-10-23 DIAGNOSIS — Z955 Presence of coronary angioplasty implant and graft: Secondary | ICD-10-CM | POA: Diagnosis not present

## 2019-10-23 DIAGNOSIS — E119 Type 2 diabetes mellitus without complications: Secondary | ICD-10-CM | POA: Diagnosis not present

## 2019-10-23 DIAGNOSIS — E039 Hypothyroidism, unspecified: Secondary | ICD-10-CM | POA: Diagnosis not present

## 2019-10-23 DIAGNOSIS — Z79899 Other long term (current) drug therapy: Secondary | ICD-10-CM | POA: Diagnosis not present

## 2019-10-23 DIAGNOSIS — Z79890 Hormone replacement therapy (postmenopausal): Secondary | ICD-10-CM | POA: Diagnosis not present

## 2019-10-23 NOTE — Progress Notes (Signed)
Daily Session Note  Patient Details  Name: Leslie Olsen MRN: [id number] Date of Birth: [birth year] Referring Provider:     Cardiac Rehab from 08/24/2019 in [facility] Cardiac and Pulmonary Rehab  Referring Provider  Paraschos      Encounter Date: 10/23/2019  Check In: Session Check In - 10/23/19 1706      Check-In   Supervising physician immediately available to respond to emergencies  See telemetry face sheet for immediately available ER MD    Location  [facility]-Cardiac & Pulmonary Rehab    Staff Present  Renita Papa, RN Moises Blood, BS, ACSM CEP, Exercise Physiologist;Joseph Tessie Fass RCP,RRT,BSRT    Virtual Visit  No    Medication changes reported      No    Fall or balance concerns reported     No    Warm-up and Cool-down  Performed on first and last piece of equipment    Resistance Training Performed  Yes    VAD Patient?  No    PAD/SET Patient?  No      Pain Assessment   Currently in Pain?  No/denies          Social History   Tobacco Use  Smoking Status Former Smoker  . Packs/day: 2.00  . Years: 45.00  . Pack years: 90.00  . Types: Cigarettes  . Quit date: 11/06/2011  . Years since quitting: 7.9  Smokeless Tobacco Never Used    Goals Met:  Independence with exercise equipment Exercise tolerated well No report of cardiac concerns or symptoms Strength training completed today  Goals Unmet:  Not Applicable  Comments: Pt able to follow exercise prescription today without complaint.  Will continue to monitor for progression.    Dr. Emily Filbert is Medical Director for [place] and [facility].

## 2019-10-24 ENCOUNTER — Ambulatory Visit
Admission: RE | Admit: 2019-10-24 | Discharge: 2019-10-24 | Disposition: A | Discharge status: Home / Self Care | Payer: PPO | Source: Ambulatory Visit | Attending: Internal Medicine | Admitting: Internal Medicine

## 2019-10-24 DIAGNOSIS — Z1231 Encounter for screening mammogram for malignant neoplasm of breast: Secondary | ICD-10-CM | POA: Diagnosis not present

## 2019-10-27 ENCOUNTER — Other Ambulatory Visit: Payer: Self-pay | Admitting: Internal Medicine

## 2019-10-27 DIAGNOSIS — R928 Other abnormal and inconclusive findings on diagnostic imaging of breast: Secondary | ICD-10-CM

## 2019-10-30 ENCOUNTER — Ambulatory Visit: Payer: PPO

## 2019-10-30 ENCOUNTER — Encounter: Payer: PPO | Admitting: *Deleted

## 2019-10-30 ENCOUNTER — Other Ambulatory Visit: Payer: Self-pay

## 2019-10-30 DIAGNOSIS — Z955 Presence of coronary angioplasty implant and graft: Secondary | ICD-10-CM | POA: Diagnosis not present

## 2019-10-30 NOTE — Progress Notes (Signed)
Daily Session Note  Patient Details  Name: SHAWNTE WINTON MRN: [id number] Date of Birth: Oct 24, 1950 Referring Provider:     Cardiac Rehab from 08/24/2019 in [facility] Cardiac and Pulmonary Rehab  Referring Provider  Paraschos      Encounter Date: 10/30/2019  Check In: Session Check In - 10/30/19 1701      Check-In   Supervising physician immediately available to respond to emergencies  See telemetry face sheet for immediately available ER MD    Location  [facility]-Cardiac & Pulmonary Rehab    Staff Present  Renita Papa, RN Moises Blood, BS, ACSM CEP, Exercise Physiologist;Joseph Tessie Fass RCP,RRT,BSRT    Virtual Visit  No    Medication changes reported      No    Fall or balance concerns reported     No    Warm-up and Cool-down  Performed on first and last piece of equipment    Resistance Training Performed  Yes    VAD Patient?  No    PAD/SET Patient?  No      Pain Assessment   Currently in Pain?  No/denies          Social History   Tobacco Use  Smoking Status Former Smoker  . Packs/day: 2.00  . Years: 45.00  . Pack years: 90.00  . Types: Cigarettes  . Quit date: 11/06/2011  . Years since quitting: 7.9  Smokeless Tobacco Never Used    Goals Met:  Independence with exercise equipment Exercise tolerated well Personal goals reviewed No report of cardiac concerns or symptoms Strength training completed today  Goals Unmet:  Not Applicable  Comments: Pt able to follow exercise prescription today without complaint.  Will continue to monitor for progression.    Dr. Emily Filbert is Medical Director for [place] and [facility].

## 2019-10-31 ENCOUNTER — Other Ambulatory Visit: Payer: Self-pay | Admitting: Internal Medicine

## 2019-10-31 DIAGNOSIS — R928 Other abnormal and inconclusive findings on diagnostic imaging of breast: Secondary | ICD-10-CM

## 2019-10-31 DIAGNOSIS — N6489 Other specified disorders of breast: Secondary | ICD-10-CM

## 2019-11-01 ENCOUNTER — Encounter: Payer: Self-pay | Admitting: *Deleted

## 2019-11-01 DIAGNOSIS — Z955 Presence of coronary angioplasty implant and graft: Secondary | ICD-10-CM

## 2019-11-01 NOTE — Progress Notes (Signed)
Cardiac Individual Treatment Plan  Patient Details  Name: Leslie Olsen MRN: [id number] Date of Birth: [birth year] Referring Provider:     Cardiac Rehab from 08/24/2019 in [facility] Cardiac and Pulmonary Rehab  Referring Provider  Paraschos      Initial Encounter Date:    Cardiac Rehab from 08/24/2019 in [facility] Cardiac and Pulmonary Rehab  Date  08/24/19      Visit Diagnosis: Status post coronary artery stent placement  Patient's Home Medications on Admission:  Current Outpatient Medications:  .  aspirin 81 MG tablet, Take 81 mg by mouth daily., Disp: , Rfl:  .  clonazePAM (KLONOPIN) 0.5 MG tablet, Take 0.5 mg by mouth at bedtime. , Disp: , Rfl:  .  ferrous gluconate (FERGON) 324 MG tablet, Take 324 mg by mouth daily., Disp: , Rfl: 11 .  glucose blood (ONE TOUCH ULTRA TEST) test strip, USE ONE STRIP TO CHECK GLUCOSE TWICE DAILY, Disp: 100 each, Rfl: 12 .  levETIRAcetam (KEPPRA) 500 MG tablet, Take 500 mg by mouth 2 (two) times daily. , Disp: , Rfl:  .  lisinopril (ZESTRIL) 5 MG tablet, Take 1 tablet (5 mg total) by mouth daily. (Patient taking differently: Take 10 mg by mouth daily. ), Disp: 90 tablet, Rfl: 1 .  metFORMIN (GLUCOPHAGE) 500 MG tablet, Take 1 tablet (500 mg total) by mouth daily with breakfast. (Patient taking differently: Take 1,000 mg by mouth 2 (two) times daily with a meal. ), Disp: 90 tablet, Rfl: 1 .  oxybutynin (DITROPAN XL) 15 MG 24 hr tablet, Take 1 tablet (15 mg total) by mouth daily. (Patient taking differently: Take 15 mg by mouth at bedtime. ), Disp: 30 tablet, Rfl: 11 .  pantoprazole (PROTONIX) 40 MG tablet, Take 40 mg by mouth daily as needed (Heartburn). , Disp: , Rfl: 3 .  prasugrel (EFFIENT) 10 MG TABS tablet, Take 1 tablet (10 mg total) by mouth daily., Disp: 30 tablet, Rfl: 5 .  SUMAtriptan (IMITREX) 100 MG tablet, Take 1 tablet (100 mg total) by mouth every 2 (two) hours as needed for migraine. May repeat in 2 hours if headache persists or recurs., Disp: 10  tablet, Rfl: 12 .  SYNTHROID 100 MCG tablet, Take 1 tablet (100 mcg total) by mouth daily before breakfast., Disp: 90 tablet, Rfl: 3  Past Medical History: Past Medical History:  Diagnosis Date  . Anemia   . Cephalalgia 04/08/2015  . Chronic headaches   . COPD (chronic obstructive pulmonary disease) ([place])   . Diabetes mellitus without complication ([place])   . Fatigue due to excessive exertion 08/22/2018  . GERD (gastroesophageal reflux disease)   . Hypertension   . Hypothyroidism   . [facility] spotted fever) 07/27/2016    Tobacco Use: Social History   Tobacco Use  Smoking Status Former Smoker  . Packs/day: 2.00  . Years: 45.00  . Pack years: 90.00  . Types: Cigarettes  . Quit date: 11/06/2011  . Years since quitting: 7.9  Smokeless Tobacco Never Used    Labs: Recent Review Flowsheet Data    Labs for ITP Cardiac and Pulmonary Rehab Latest Ref Rng & Units 09/01/2017 12/28/2017 06/29/2018 10/07/2018 01/06/2019   Cholestrol 100 - 199 mg/dL - 265(H) 245(H) - 266(H)   LDLCALC 0 - 99 mg/dL - 178(H) 154(H) - 184(H)   HDL >39 mg/dL - 47 42 - 43   Trlycerides 0 - 149 mg/dL - 198(H) 246(H) - 194(H)   Hemoglobin A1c <7.0 % 6.1 5.8 5.9(H) 6.0 6.1  Exercise Target Goals: Exercise Program Goal: Individual exercise prescription set using results from initial 6 min walk test and THRR while considering  patient's activity barriers and safety.   Exercise Prescription Goal: Initial exercise prescription builds to 30-45 minutes a day of aerobic activity, 2-3 days per week.  Home exercise guidelines will be given to patient during program as part of exercise prescription that the participant will acknowledge.  Activity Barriers & Risk Stratification:   6 Minute Walk: 6 Minute Walk    Row Name 08/24/19 1429         6 Minute Walk   Phase  Initial     Distance  1025 feet     Walk Time  6 minutes     # of Rest Breaks  0     MPH  1.94     METS  2.63     RPE  13      Perceived Dyspnea   1     VO2 Peak  9.22     Symptoms  No     Resting HR  104 bpm     Resting BP  128/68     Resting Oxygen Saturation   98 %     Exercise Oxygen Saturation  during 6 min walk  97 %     Max Ex. HR  131 bpm     Max Ex. BP  152/64     2 Minute Post BP  124/64        Oxygen Initial Assessment:   Oxygen Re-Evaluation:   Oxygen Discharge (Final Oxygen Re-Evaluation):   Initial Exercise Prescription: Initial Exercise Prescription - 08/24/19 1400      Date of Initial Exercise RX and Referring Provider   Date  08/24/19    Referring Provider  Paraschos      Treadmill   MPH  1.9    Grade  0.5    Minutes  15    METs  2.6      Recumbant Bike   Level  2    RPM  60    Minutes  15    METs  2.6      NuStep   Level  3    SPM  80    Minutes  15    METs  2.6      REL-XR   Level  3    Speed  50    Minutes  15    METs  2.6      T5 Nustep   Level  2    SPM  80    Minutes  15    METs  2.6      Prescription Details   Frequency (times per week)  3    Duration  Progress to 30 minutes of continuous aerobic without signs/symptoms of physical distress      Intensity   THRR 40-80% of Max Heartrate  122-141    Ratings of Perceived Exertion  11-15    Perceived Dyspnea  0-4      Resistance Training   Training Prescription  Yes    Weight  3 lb    Reps  10-15       Perform Capillary Blood Glucose checks as needed.  Exercise Prescription Changes: Exercise Prescription Changes    Row Name 08/24/19 1400 09/07/19 1200 09/21/19 1600 10/02/19 1500 10/18/19 0900     Response to Exercise   Blood Pressure (Admit)  128/68  138/64  122/60  124/70  120/82   Blood Pressure (Exercise)  152/64  172/66  156/66  154/66  148/68   Blood Pressure (Exit)  124/64  142/62  146/74  118/60  130/70   Heart Rate (Admit)  104 bpm  103 bpm  81 bpm  80 bpm  105 bpm   Heart Rate (Exercise)  131 bpm  125 bpm  126 bpm  116 bpm  134 bpm   Heart Rate (Exit)  73 bpm  97 bpm  92 bpm   91 bpm  117 bpm   Oxygen Saturation (Admit)  98 %  --  --  --  --   Oxygen Saturation (Exercise)  97 %  --  --  --  --   Rating of Perceived Exertion (Exercise)  _0 Perceived Dyspnea (Exercise)  1  --  --  --  --   Symptoms  --  none  none  none  none   Comments  --  1st day  second full day of exercise  --  --   Duration  --  --  Continue with 30 min of aerobic exercise without signs/symptoms of physical distress.  Continue with 30 min of aerobic exercise without signs/symptoms of physical distress.  Continue with 30 min of aerobic exercise without signs/symptoms of physical distress.   Intensity  --  --  THRR unchanged  THRR unchanged  THRR unchanged     Progression   Progression  --  --  Continue to progress workloads to maintain intensity without signs/symptoms of physical distress.  Continue to progress workloads to maintain intensity without signs/symptoms of physical distress.  Continue to progress workloads to maintain intensity without signs/symptoms of physical distress.   Average METs  --  --  2.76  2.76  2.56     Resistance Training   Training Prescription  --  Yes  Yes  Yes  Yes   Weight  --  3 lb  3 lb  3 lb  3 lb   Reps  --  10-15  10-15  10-15  10-15     Interval Training   Interval Training  --  --  No  No  No     Treadmill   MPH  --  --  2  --  1.7   Grade  --  --  2.5  --  0.5   Minutes  --  --  15  --  15   METs  --  --  3.22  --  2.42     NuStep   Level  --  --  _1 SPM  --  --  --  80  --   Minutes  --  --  _2 METs  --  --  2.3  2.4  2.7     Arm Ergometer   Level  --  1  --  --  --   Minutes  --  15  --  --  --   METs  --  2.5  --  --  --     T5 Nustep   Level  --  1  --  --  --   SPM  --  80  --  --  --   Minutes  --  15  --  --  --   METs  --  2.5  --  --  --  Biostep-RELP   Level  --  --  --  1  --   SPM  --  --  --  50  --   Minutes  --  --  --  15  --   METs  --  --  --  2  --   Row Name 10/31/19 1300              Response to Exercise   Blood Pressure (Admit)  130/80       Blood Pressure (Exercise)  150/60       Blood Pressure (Exit)  128/58       Heart Rate (Admit)  71 bpm       Heart Rate (Exercise)  140 bpm       Heart Rate (Exit)  123 bpm       Rating of Perceived Exertion (Exercise)  17       Symptoms  none       Duration  Continue with 30 min of aerobic exercise without signs/symptoms of physical distress.       Intensity  THRR unchanged         Progression   Progression  Continue to progress workloads to maintain intensity without signs/symptoms of physical distress.       Average METs  2.56         Resistance Training   Training Prescription  Yes       Weight  3 lb       Reps  10-15         Interval Training   Interval Training  No         Treadmill   MPH  1.8       Grade  0.5       Minutes  15       METs  2.42         NuStep   Level  3       Minutes  15       METs  2.7          Exercise Comments:   Exercise Goals and Review: Exercise Goals    [place] Name 08/24/19 1442             Exercise Goals   Increase Physical Activity  Yes       Intervention  Provide advice, education, support and counseling about physical activity/exercise needs.;Develop an individualized exercise prescription for aerobic and resistive training based on initial evaluation findings, risk stratification, comorbidities and participant's personal goals.       Expected Outcomes  Short Term: Attend rehab on a regular basis to increase amount of physical activity.;Long Term: Add in home exercise to make exercise part of routine and to increase amount of physical activity.;Long Term: Exercising regularly at least 3-5 days a week.       Increase Strength and Stamina  Yes       Intervention  Provide advice, education, support and counseling about physical activity/exercise needs.;Develop an individualized exercise prescription for aerobic and resistive training based on initial evaluation  findings, risk stratification, comorbidities and participant's personal goals.       Expected Outcomes  Short Term: Increase workloads from initial exercise prescription for resistance, speed, and METs.;Short Term: Perform resistance training exercises routinely during rehab and add in resistance training at home;Long Term: Improve cardiorespiratory fitness, muscular endurance and strength as measured by increased METs and functional capacity (6MWT)       Able  to understand and use rate of perceived exertion (RPE) scale  Yes       Intervention  Provide education and explanation on how to use RPE scale       Expected Outcomes  Short Term: Able to use RPE daily in rehab to express subjective intensity level;Long Term:  Able to use RPE to guide intensity level when exercising independently       Knowledge and understanding of Target Heart Rate Range (THRR)  Yes       Intervention  Provide education and explanation of THRR including how the numbers were predicted and where they are located for reference       Expected Outcomes  Short Term: Able to state/look up THRR;Short Term: Able to use daily as guideline for intensity in rehab;Long Term: Able to use THRR to govern intensity when exercising independently       Able to check pulse independently  Yes       Intervention  Provide education and demonstration on how to check pulse in carotid and radial arteries.;Review the importance of being able to check your own pulse for safety during independent exercise       Expected Outcomes  Short Term: Able to explain why pulse checking is important during independent exercise;Long Term: Able to check pulse independently and accurately       Understanding of Exercise Prescription  Yes       Intervention  Provide education, explanation, and written materials on patient's individual exercise prescription       Expected Outcomes  Short Term: Able to explain program exercise prescription;Long Term: Able to explain home  exercise prescription to exercise independently          Exercise Goals Re-Evaluation : Exercise Goals Re-Evaluation    Row Name 09/04/19 1718 09/21/19 1624 09/28/19 0751 10/18/19 0936 10/30/19 1709     Exercise Goal Re-Evaluation   Exercise Goals Review  Increase Physical Activity;Increase Strength and Stamina;Able to understand and use rate of perceived exertion (RPE) scale;Knowledge and understanding of Target Heart Rate Range (THRR);Able to check pulse independently;Understanding of Exercise Prescription  Increase Physical Activity;Increase Strength and Stamina;Understanding of Exercise Prescription  Increase Physical Activity;Increase Strength and Stamina  Increase Physical Activity;Increase Strength and Stamina  Increase Physical Activity;Increase Strength and Stamina   Comments  Reviewed RPE scale, THR and program prescription with pt today.  Pt voiced understanding and was given a copy of goals to take home.  Audery is off to a good start in rehab. She has completed her first two sessions.  We will continue to encourage her to progress.  Jamiyah is going to the track to walk. She is almost walking a mile two days a week. She gets out of breath a little and takes breaks as needed. She wants to keep up her stamina and keep doing cardio to keep from having blockages in her heart.  Raelle missed her appointment this week.  She was not feeling well.  She hopes to be back next week.  At her last appointment, she had wanted to discharge, but changed her mind once she found out that we would be able to come more frequently she is going to stay.  She is now on level 3 on the NuStep.  Josphine has been having other health issues that have been making her feel weak and making it hard for her to exercise at home. She is still attending cardiac rehab class 2x per week and is following up with  her doctors this week about her new symptoms. Her shortness of breath and weakness are her main limiting factors currently.    Expected Outcomes  Short: Use RPE daily to regulate intensity. Long: Follow program prescription in THR.  Short: Attend regularly.  Long: Continue to follow program prescription.  Short: continue to walk outside of rehab. Long: join a gym or follow an exercise program for home.  Short: Return to class regularly  Long: Continue to exercise at home on off days.  Short: Follow up with doctor this week about current new symptoms. Long: Increase strength and stamina and become independent with exercise routine.      Discharge Exercise Prescription (Final Exercise Prescription Changes): Exercise Prescription Changes - 10/31/19 1300      Response to Exercise   Blood Pressure (Admit)  130/80    Blood Pressure (Exercise)  150/60    Blood Pressure (Exit)  128/58    Heart Rate (Admit)  71 bpm    Heart Rate (Exercise)  140 bpm    Heart Rate (Exit)  123 bpm    Rating of Perceived Exertion (Exercise)  17    Symptoms  none    Duration  Continue with 30 min of aerobic exercise without signs/symptoms of physical distress.    Intensity  THRR unchanged      Progression   Progression  Continue to progress workloads to maintain intensity without signs/symptoms of physical distress.    Average METs  2.56      Resistance Training   Training Prescription  Yes    Weight  3 lb    Reps  10-15      Interval Training   Interval Training  No      Treadmill   MPH  1.8    Grade  0.5    Minutes  15    METs  2.42      NuStep   Level  3    Minutes  15    METs  2.7       Nutrition:  Target Goals: Understanding of nutrition guidelines, daily intake of sodium '1500mg'$ , cholesterol '200mg'$ , calories 30% from fat and 7% or less from saturated fats, daily to have 5 or more servings of fruits and vegetables.  Biometrics: Pre Biometrics - 08/24/19 1442      Pre Biometrics   Height  5' 4.75" (1.645 m)    Weight  191 lb 9.6 oz (86.9 kg)    BMI (Calculated)  32.12    Single Leg Stand  4.78 seconds         Nutrition Therapy Plan and Nutrition Goals: Nutrition Therapy & Goals - 09/21/19 1402      Nutrition Therapy   Diet  HH, DM, Low Na diet    Protein (specify units)  70g    Fiber  30 grams    Whole Grain Foods  3 servings    Saturated Fats  12 max. grams    Fruits and Vegetables  5 servings/day    Sodium  1.5 grams      Personal Nutrition Goals   Nutrition Goal  ST: LT: Maintaining functional years, prevent 50% blockage from going to 70% in two arteries    Comments  A1C is 6.2 - BG stable. B: oatmeal with apple L: sometimes skipped (sandwich with lunch meat or grilled on sandwich thins multigrain or peanut butter and banana sandwiches). D: vegetable plate at [employer] or canned soup or scrambled eggs with grits (pt reports not  eating a lot of meat). Discussed HH and DM friendly eating. Gave suggestions such as adding protein/fat to breakfast such as nuts or seeds, eating lunch consistenly, eat protein at dinner when not having eggs such as beans or fish like canned tuna. Pt reports not wanting to make ST goals right now due to persistent steatorrhea - pt reports that during testing her pancreas was of concern - suspect she may have some pancreatic insufficiency. Encouraged pt to contact doctor as soon as she can (pt was already trying). Will follow up.      Intervention Plan   Intervention  Prescribe, educate and counsel regarding individualized specific dietary modifications aiming towards targeted core components such as weight, hypertension, lipid management, diabetes, heart failure and other comorbidities.;Nutrition handout(s) given to patient.    Expected Outcomes  Short Term Goal: Understand basic principles of dietary content, such as calories, fat, sodium, cholesterol and nutrients.;Short Term Goal: A plan has been developed with personal nutrition goals set during dietitian appointment.;Long Term Goal: Adherence to prescribed nutrition plan.       Nutrition Assessments:   Nutrition  Goals Re-Evaluation: Nutrition Goals Re-Evaluation    [place] Name 10/23/19 1713             Goals   Nutrition Goal  ST: LT: Maintaining functional years, prevent 50% blockage from going to 70% in two arteries       Comment  Pt has appointment 2/25 regarding suspected steatorrhea and pancreatic issues. Will not make changes until pt sees doctor       Expected Outcome  ST: LT: Maintaining functional years, prevent 50% blockage from going to 70% in two arteries          Nutrition Goals Discharge (Final Nutrition Goals Re-Evaluation): Nutrition Goals Re-Evaluation - 10/23/19 1713      Goals   Nutrition Goal  ST: LT: Maintaining functional years, prevent 50% blockage from going to 70% in two arteries    Comment  Pt has appointment 2/25 regarding suspected steatorrhea and pancreatic issues. Will not make changes until pt sees doctor    Expected Outcome  ST: LT: Maintaining functional years, prevent 50% blockage from going to 70% in two arteries       Psychosocial: Target Goals: Acknowledge presence or absence of significant depression and/or stress, maximize coping skills, provide positive support system. Participant is able to verbalize types and ability to use techniques and skills needed for reducing stress and depression.   Initial Review & Psychosocial Screening: Initial Psych Review & Screening - 08/22/19 1417      Initial Review   Current issues with  Current Stress Concerns    Comments  Patient is mostly stressed due to [place]. She wears her mask and tries to protect herself.      Family Dynamics   Good Support System?  Yes    Comments  She looks to her son and grand daughter for support.      Barriers   Psychosocial barriers to participate in program  The patient should benefit from training in stress management and relaxation.      Screening Interventions   Interventions  Encouraged to exercise;Provide feedback about the scores to participant;To provide support and  resources with identified psychosocial needs    Expected Outcomes  Short Term goal: Utilizing psychosocial counselor, staff and physician to assist with identification of specific Stressors or current issues interfering with healing process. Setting desired goal for each stressor or current issue identified.;Long Term Goal: Stressors  or current issues are controlled or eliminated.;Long Term goal: The participant improves quality of Life and PHQ9 Scores as seen by post scores and/or verbalization of changes;Short Term goal: Identification and review with participant of any Quality of Life or Depression concerns found by scoring the questionnaire.       Quality of Life Scores:  Quality of Life - 08/24/19 1447      Quality of Life   Select  Quality of Life      Quality of Life Scores   Health/Function Pre  27.39 %    Socioeconomic Pre  30 %    Psych/Spiritual Pre  28.29 %    Family Pre  25.5 %    GLOBAL Pre  27.78 %      Scores of 19 and below usually indicate a poorer quality of life in these areas.  A difference of  2-3 points is a clinically meaningful difference.  A difference of 2-3 points in the total score of the Quality of Life Index has been associated with significant improvement in overall quality of life, self-image, physical symptoms, and general health in studies assessing change in quality of life.  PHQ-9: Recent Review Flowsheet Data    Depression screen [facility] 2/9 08/24/2019 10/07/2018 09/09/2017 09/03/2016 02/21/2016   Decreased Interest 0 0 0 0 0   Down, Depressed, Hopeless 0 0 0 0 0   PHQ - 2 Score 0 0 0 0 0   Altered sleeping 1 1 - 2 -   Tired, decreased energy 1 3 - 1 -   Change in appetite 2 1 - 3 -   Feeling bad or failure about yourself  0 0 - 0 -   Trouble concentrating 0 0 - 0 -   Moving slowly or fidgety/restless 0 1 - 1 -   Suicidal thoughts 0 0 - 0 -   PHQ-9 Score 4 6 - 7 -   Difficult doing work/chores Somewhat difficult Very difficult - - -      Interpretation of Total Score  Total Score Depression Severity:  1-4 = Minimal depression, 5-9 = Mild depression, 10-14 = Moderate depression, 15-19 = Moderately severe depression, 20-27 = Severe depression   Psychosocial Evaluation and Intervention: Psychosocial Evaluation - 08/22/19 1421      Psychosocial Evaluation & Interventions   Interventions  Encouraged to exercise with the program and follow exercise prescription    Comments  Patient is mostly stressed due to [place]. She wears her mask and tries to protect herself. She is ready to exercise and get herself in shape and has a positive attitude.    Expected Outcomes  Short: Attend HeartTrack stress management education to decrease stress. Long: Maintain exercise Post HeartTrack to keep stress at a minimum    Continue Psychosocial Services   Follow up required by staff       Psychosocial Re-Evaluation: Psychosocial Re-Evaluation    Row Name 09/28/19 0754 10/30/19 1715           Psychosocial Re-Evaluation   Current issues with  Current Stress Concerns;Current Sleep Concerns  Current Stress Concerns;Current Sleep Concerns      Comments  Geralynn has not been sleeping well lately. She needs her Kepra and Klonopin refilled. She is going to check her pharmacy today to see if they are filled. She is out of blood pressure medicine as well. The doctor send out new prescriptions tuesday morning and is still not in.  Alexie reports that she does have some new  stress concerns related to feeling weak and being in pain. She has F/Uvisits with several doctors this week and next to adress these concerns. She also has a F/U appointment from a mamogram for further investigation. She reports taking all medications perscribed by her doctor and has a strong support system.      Expected Outcomes  Short: go to Pharmacy and pick up prescriptions to help sleep. Long: continue to get medications regularly to prevent sleep and stress issues.  Short: Go to  doctors appointments to investigate current health concerns. Long: develop healthy sleep/ stress management routine.      Interventions  Encouraged to attend Cardiac Rehabilitation for the exercise  Encouraged to attend Cardiac Rehabilitation for the exercise      Continue Psychosocial Services   Follow up required by staff  Follow up required by staff         Psychosocial Discharge (Final Psychosocial Re-Evaluation): Psychosocial Re-Evaluation - 10/30/19 1715      Psychosocial Re-Evaluation   Current issues with  Current Stress Concerns;Current Sleep Concerns    Comments  Hildagard reports that she does have some new stress concerns related to feeling weak and being in pain. She has F/Uvisits with several doctors this week and next to adress these concerns. She also has a F/U appointment from a mamogram for further investigation. She reports taking all medications perscribed by her doctor and has a strong support system.    Expected Outcomes  Short: Go to doctors appointments to investigate current health concerns. Long: develop healthy sleep/ stress management routine.    Interventions  Encouraged to attend Cardiac Rehabilitation for the exercise    Continue Psychosocial Services   Follow up required by staff       Vocational Rehabilitation: Provide vocational rehab assistance to qualifying candidates.   Vocational Rehab Evaluation & Intervention:   Education: Education Goals: Education classes will be provided on a variety of topics geared toward better understanding of heart health and risk factor modification. Participant will state understanding/return demonstration of topics presented as noted by education test scores.  Learning Barriers/Preferences: Learning Barriers/Preferences - 08/22/19 1419      Learning Barriers/Preferences   Learning Barriers  None    Learning Preferences  None       Education Topics:  AED/CPR: - Group verbal and written instruction with the use of  models to demonstrate the basic use of the AED with the basic ABC's of resuscitation.   General Nutrition Guidelines/Fats and Fiber: -Group instruction provided by verbal, written material, models and posters to present the general guidelines for heart healthy nutrition. Gives an explanation and review of dietary fats and fiber.   Controlling Sodium/Reading Food Labels: -Group verbal and written material supporting the discussion of sodium use in heart healthy nutrition. Review and explanation with models, verbal and written materials for utilization of the food label.   Exercise Physiology & General Exercise Guidelines: - Group verbal and written instruction with models to review the exercise physiology of the cardiovascular system and associated critical values. Provides general exercise guidelines with specific guidelines to those with heart or lung disease.    Cardiac Rehab from 08/24/2019 in [facility] Cardiac and Pulmonary Rehab  Date  08/24/19  Educator  AS  Instruction Review Code  1- Verbalizes Understanding      Aerobic Exercise & Resistance Training: - Gives group verbal and written instruction on the various components of exercise. Focuses on aerobic and resistive training programs and the benefits of this  training and how to safely progress through these programs..   Flexibility, Balance, Mind/Body Relaxation: Provides group verbal/written instruction on the benefits of flexibility and balance training, including mind/body exercise modes such as yoga, pilates and tai chi.  Demonstration and skill practice provided.   Stress and Anxiety: - Provides group verbal and written instruction about the health risks of elevated stress and causes of high stress.  Discuss the correlation between heart/lung disease and anxiety and treatment options. Review healthy ways to manage with stress and anxiety.   Depression: - Provides group verbal and written instruction on the correlation  between heart/lung disease and depressed mood, treatment options, and the stigmas associated with seeking treatment.   Anatomy & Physiology of the Heart: - Group verbal and written instruction and models provide basic cardiac anatomy and physiology, with the coronary electrical and arterial systems. Review of Valvular disease and Heart Failure   Cardiac Procedures: - Group verbal and written instruction to review commonly prescribed medications for heart disease. Reviews the medication, class of the drug, and side effects. Includes the steps to properly store meds and maintain the prescription regimen. (beta blockers and nitrates)   Cardiac Medications I: - Group verbal and written instruction to review commonly prescribed medications for heart disease. Reviews the medication, class of the drug, and side effects. Includes the steps to properly store meds and maintain the prescription regimen.   Cardiac Medications II: -Group verbal and written instruction to review commonly prescribed medications for heart disease. Reviews the medication, class of the drug, and side effects. (all other drug classes)    Go Sex-Intimacy & Heart Disease, Get SMART - Goal Setting: - Group verbal and written instruction through game format to discuss heart disease and the return to sexual intimacy. Provides group verbal and written material to discuss and apply goal setting through the application of the S.M.A.R.T. Method.   Other Matters of the Heart: - Provides group verbal, written materials and models to describe Stable Angina and Peripheral Artery. Includes description of the disease process and treatment options available to the cardiac patient.   Exercise & Equipment Safety: - Individual verbal instruction and demonstration of equipment use and safety with use of the equipment.   Cardiac Rehab from 08/24/2019 in [facility] Cardiac and Pulmonary Rehab  Date  08/24/19  Educator  AS  Instruction Review Code   1- Verbalizes Understanding      Infection Prevention: - Provides verbal and written material to individual with discussion of infection control including proper hand washing and proper equipment cleaning during exercise session.   Cardiac Rehab from 08/24/2019 in [facility] Cardiac and Pulmonary Rehab  Date  08/24/19  Educator  AS  Instruction Review Code  1- Verbalizes Understanding      Falls Prevention: - Provides verbal and written material to individual with discussion of falls prevention and safety.   Cardiac Rehab from 08/24/2019 in [facility] Cardiac and Pulmonary Rehab  Date  08/24/19  Educator  AS  Instruction Review Code  1- Verbalizes Understanding      Diabetes: - Individual verbal and written instruction to review signs/symptoms of diabetes, desired ranges of glucose level fasting, after meals and with exercise. Acknowledge that pre and post exercise glucose checks will be done for 3 sessions at entry of program.   Cardiac Rehab from 08/22/2019 in [facility] Cardiac and Pulmonary Rehab  Date  08/22/19  Educator  [facility]  Instruction Review Code  1- Verbalizes Understanding      Know Your Numbers  and Risk Factors: -Group verbal and written instruction about important numbers in your health.  Discussion of what are risk factors and how they play a role in the disease process.  Review of Cholesterol, Blood Pressure, Diabetes, and BMI and the role they play in your overall health.   Sleep Hygiene: -Provides group verbal and written instruction about how sleep can affect your health.  Define sleep hygiene, discuss sleep cycles and impact of sleep habits. Review good sleep hygiene tips.    Other: -Provides group and verbal instruction on various topics (see comments)   Knowledge Questionnaire Score: Knowledge Questionnaire Score - 08/24/19 1446      Knowledge Questionnaire Score   Pre Score  24/26       Core Components/Risk Factors/Patient Goals at Admission: Personal Goals and  Risk Factors at Admission - 08/24/19 1453      Core Components/Risk Factors/Patient Goals on Admission    Weight Management  Yes;Weight Loss    Intervention  Weight Management: Develop a combined nutrition and exercise program designed to reach desired caloric intake, while maintaining appropriate intake of nutrient and fiber, sodium and fats, and appropriate energy expenditure required for the weight goal.;Weight Management: Provide education and appropriate resources to help participant work on and attain dietary goals.;Weight Management/Obesity: Establish reasonable short term and long term weight goals.    Admit Weight  191 lb 9.6 oz (86.9 kg)    Goal Weight: Short Term  180 lb (81.6 kg)    Goal Weight: Long Term  170 lb (77.1 kg)    Expected Outcomes  Short Term: Continue to assess and modify interventions until short term weight is achieved;Weight Maintenance: Understanding of the daily nutrition guidelines, which includes 25-35% calories from fat, 7% or less cal from saturated fats, less than '200mg'$  cholesterol, less than 1.5gm of sodium, & 5 or more servings of fruits and vegetables daily;Long Term: Adherence to nutrition and physical activity/exercise program aimed toward attainment of established weight goal;Weight Loss: Understanding of general recommendations for a balanced deficit meal plan, which promotes 1-2 lb weight loss per week and includes a negative energy balance of [phone] kcal/d;Understanding recommendations for meals to include 15-35% energy as protein, 25-35% energy from fat, 35-60% energy from carbohydrates, less than '200mg'$  of dietary cholesterol, 20-35 gm of total fiber daily;Understanding of distribution of calorie intake throughout the day with the consumption of 4-5 meals/snacks    Intervention  Provide education about signs/symptoms and action to take for hypo/hyperglycemia.;Provide education about proper nutrition, including hydration, and aerobic/resistive exercise  prescription along with prescribed medications to achieve blood glucose in normal ranges: Fasting glucose 65-99 mg/dL    Intervention  Provide education on lifestyle modifcations including regular physical activity/exercise, weight management, moderate sodium restriction and increased consumption of fresh fruit, vegetables, and low fat dairy, alcohol moderation, and smoking cessation.;Monitor prescription use compliance.    Expected Outcomes  Short Term: Continued assessment and intervention until BP is < 140/37m HG in hypertensive participants. < 130/821mHG in hypertensive participants with diabetes, heart failure or chronic kidney disease.;Long Term: Maintenance of blood pressure at goal levels.    Intervention  Provide education and support for participant on nutrition & aerobic/resistive exercise along with prescribed medications to achieve LDL '70mg'$ , HDL >'40mg'$ .    Expected Outcomes  Long Term: Cholesterol controlled with medications as prescribed, with individualized exercise RX and with personalized nutrition plan. Value goals: LDL < '70mg'$ , HDL > 40 mg.;Short Term: Participant states understanding of desired cholesterol values and is  compliant with medications prescribed. Participant is following exercise prescription and nutrition guidelines.       Core Components/Risk Factors/Patient Goals Review:  Goals and Risk Factor Review    Row Name 09/28/19 0800 10/30/19 1730           Core Components/Risk Factors/Patient Goals Review   Personal Goals Review  Weight Management/Obesity;Hypertension;Lipids;Diabetes  Weight Management/Obesity;Hypertension;Lipids;Diabetes      Review  Sheilyn had her A1C checked and it was 6.6. She is working on getting her blood pressure medications refilled. She is checking her blood sugar at home regularly. She checks hre blood pressure at home and has been stable. She has not lost weight since the start of the program but the program has been moved to once every other  week.  Kevona continues to monitor blood glucose and blood pressure at home. Reports that these numbers and good. She has been able to get all meds refilled and reports taking all meds a prescribed. Her weigh has been stready and has not gained or lost anything yet.      Expected Outcomes  Short: lose weight in two weeks. Long: Lose 20 pounds.  Short: lose weight in two weeks. Long: Lose 20 pounds.         Core Components/Risk Factors/Patient Goals at Discharge (Final Review):  Goals and Risk Factor Review - 10/30/19 1730      Core Components/Risk Factors/Patient Goals Review   Personal Goals Review  Weight Management/Obesity;Hypertension;Lipids;Diabetes    Review  Lysbeth continues to monitor blood glucose and blood pressure at home. Reports that these numbers and good. She has been able to get all meds refilled and reports taking all meds a prescribed. Her weigh has been stready and has not gained or lost anything yet.    Expected Outcomes  Short: lose weight in two weeks. Long: Lose 20 pounds.       ITP Comments: ITP Comments    Row Name 08/22/19 1427 09/04/19 1716 09/06/19 0901 09/21/19 1435 10/04/19 1422   ITP Comments  Diagnosis can be found in [facility] 07/20/2019 S/P Coronary Artery Stent.  First full day of exercise!  Patient was oriented to gym and equipment including functions, settings, policies, and procedures.  Patient's individual exercise prescription and treatment plan were reviewed.  All starting workloads were established based on the results of the 6 minute walk test done at initial orientation visit.  The plan for exercise progression was also introduced and progression will be customized based on patient's performance and goals.  30 day review competed . ITP sent to Dr Emily Filbert for review, changes as needed and ITP approval signature  New to program  Completed Initial RD Eval  30 day review completed. ITP sent to Dr. Emily Filbert, Medical Director of Cardiac and Pulmonary Rehab.  Continue with ITP unless changes are made by physician.  Department operating under reduced schedule until further notice by request from hospital leadership.   [place] Name 10/09/19 1526 11/01/19 0639         ITP Comments  Follow up call completed. Albina has had a hard week given that she has lost 3 friends in one week. She is trying to focus on self care but it has been hard. She was happy to hear that we can start having patients 2 days a week instead of every other week. She is trying to stay active but looking forward to a more normal "set" schedule.  30 day chart review completed. ITP sent to Dr  [place] Director, for review,changes as needed and signature.         Comments:

## 2019-11-02 ENCOUNTER — Other Ambulatory Visit: Payer: Self-pay | Admitting: Gastroenterology

## 2019-11-02 ENCOUNTER — Encounter: Payer: Self-pay | Admitting: Gastroenterology

## 2019-11-02 ENCOUNTER — Other Ambulatory Visit: Payer: Self-pay

## 2019-11-02 ENCOUNTER — Ambulatory Visit: Payer: PPO | Admitting: Gastroenterology

## 2019-11-02 VITALS — BP 123/68 | HR 101 | Temp 98.0°F | Ht 66.0 in | Wt 190.2 lb

## 2019-11-02 DIAGNOSIS — K921 Melena: Secondary | ICD-10-CM

## 2019-11-02 NOTE — Progress Notes (Addendum)
Gastroenterology Consultation  Referring Provider:     Valerie Roys, DO Primary Care Physician:  Valerie Roys, DO Primary Gastroenterologist:  Dr. Allen Norris     Reason for Consultation:     Irritable bowel syndrome with diarrhea        HPI:   Leslie Olsen is a 69 y.o. y/o female referred for consultation & management of irritable bowel syndrome with diarrhea by Dr. Edwina Barth, Chrystie Nose, MD.  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  The patient then had a colonoscopy the following year by a Dr. Kennith Gain at [facility].  She subsequently followed with another gastroenterologist at [facility] with a upper endoscopy in 2019 by Dr. Ellender Hose.  During that time the patient was also being followed by Maralyn Sago, MD, who is also a gastroenterologist at [facility].  The patient was diagnosed with small intestinal bacterial overgrowth and from the chart appears that she was recommended treatment for that.  The patient had called our office to reestablish care with [place] because she was having greasy uncontrolled bowel movements.  She reports that the reason she stopped seeing me in 2017 was that a appointment had gotten canceled at [facility] so she was upset therefore switch her care to [facility]. The patient also reports that she is lost 7 pounds since November when she had a cardiac stent placed and was put on anticoagulation.  The patient endorses a lot of bruising and most of her problems starting when she was started on the anticoagulation.  She also has black stools that are soft and greasy and she sometimes does not make it to the bathroom due to urgency.  The patient's last hemoglobin was slightly over 11 back in November and it does not appear she has had any blood work since then.  She reports that she does not want to go back to [facility] and would like to be followed by me.  Past Medical History:  Diagnosis  Date  . Anemia   . Cephalalgia 04/08/2015  . Chronic headaches   . COPD (chronic obstructive pulmonary disease) ([place])   . Diabetes mellitus without complication ([place])   . Fatigue due to excessive exertion 08/22/2018  . GERD (gastroesophageal reflux disease)   . Hypertension   . Hypothyroidism   . [facility] spotted fever) 07/27/2016    Past Surgical History:  Procedure Laterality Date  . ABDOMINAL HYSTERECTOMY    . BACK SURGERY    . BREAST EXCISIONAL BIOPSY Left 1970's  . CHOLECYSTECTOMY    . CORONARY STENT INTERVENTION N/A 07/31/2019   Procedure: CORONARY STENT INTERVENTION;  Surgeon: Isaias Cowman, MD;  Location: [place] [facility];  Service: Cardiovascular;  Laterality: N/A;  RCA  . CORONARY STENT PLACEMENT    . ESOPHAGOGASTRODUODENOSCOPY (EGD) WITH PROPOFOL N/A 05/26/2016   Procedure: ESOPHAGOGASTRODUODENOSCOPY (EGD) WITH PROPOFOL;  Surgeon: Lucilla Lame, MD;  Location: [facility] ENDOSCOPY;  Service: Endoscopy;  Laterality: N/A;  . GIVENS CAPSULE STUDY N/A 05/05/2016   Procedure: GIVENS CAPSULE STUDY;  Surgeon: Lucilla Lame, MD;  Location: [facility] ENDOSCOPY;  Service: Endoscopy;  Laterality: N/A;  . LEFT HEART CATH AND CORONARY ANGIOGRAPHY Left 07/31/2019   Procedure: LEFT HEART CATH AND CORONARY ANGIOGRAPHY;  Surgeon: Isaias Cowman, MD;  Location: [place] [facility];  Service: Cardiovascular;  Laterality: Left;  . REDUCTION MAMMAPLASTY    .  REPLACEMENT TOTAL KNEE    . SHOULDER SURGERY    . TIBIA FRACTURE SURGERY      Prior to Admission medications   Medication Sig Start Date End Date Taking? Authorizing Provider  aspirin 81 MG tablet Take 81 mg by mouth daily.    [provider]  clonazePAM (KLONOPIN) 0.5 MG tablet Take 0.5 mg by mouth at bedtime.  02/23/16   [provider]  ferrous gluconate (FERGON) 324 MG tablet Take 324 mg by mouth daily. 05/28/18   [provider]  glucose blood (ONE TOUCH ULTRA TEST) test strip USE ONE STRIP TO  CHECK GLUCOSE TWICE DAILY 10/07/18   Johnson, Megan P, DO  levETIRAcetam (KEPPRA) 500 MG tablet Take 500 mg by mouth 2 (two) times daily.  10/03/17   [provider]  lisinopril (ZESTRIL) 5 MG tablet Take 1 tablet (5 mg total) by mouth daily. Patient taking differently: Take 10 mg by mouth daily.  01/06/19   Johnson, Megan P, DO  metFORMIN (GLUCOPHAGE) 500 MG tablet Take 1 tablet (500 mg total) by mouth daily with breakfast. Patient taking differently: Take 1,000 mg by mouth 2 (two) times daily with a meal.  01/06/19   Johnson, Megan P, DO  oxybutynin (DITROPAN XL) 15 MG 24 hr tablet Take 1 tablet (15 mg total) by mouth daily. Patient taking differently: Take 15 mg by mouth at bedtime.  05/02/19   Billey Co, MD  pantoprazole (PROTONIX) 40 MG tablet Take 40 mg by mouth daily as needed (Heartburn).  05/28/18   [provider]  prasugrel (EFFIENT) 10 MG TABS tablet Take 1 tablet (10 mg total) by mouth daily. 08/01/19   Paraschos, Alexander, MD  SUMAtriptan (IMITREX) 100 MG tablet Take 1 tablet (100 mg total) by mouth every 2 (two) hours as needed for migraine. May repeat in 2 hours if headache persists or recurs. 10/07/18   Park Liter P, DO  SYNTHROID 100 MCG tablet Take 1 tablet (100 mcg total) by mouth daily before breakfast. 10/07/18   Valerie Roys, DO    Family History  Problem Relation Age of Onset  . Lung cancer Maternal Aunt   . Breast cancer Maternal Aunt 57  . Other Mother        Killed in car accident, 34  . Other Father        Killed in car accident, 58  . Thyroid disease Brother   . Thyroid disease Sister      Social History   Tobacco Use  . Smoking status: Former Smoker    Packs/day: 2.00    Years: 45.00    Pack years: 90.00    Types: Cigarettes    Quit date: 11/06/2011    Years since quitting: 7.9  . Smokeless tobacco: Never Used  Substance Use Topics  . Alcohol use: No  . Drug use: No    Allergies as of 11/02/2019  . (No Known Allergies)     Review of Systems:    All systems reviewed and negative except where noted in HPI.   Physical Exam:  There were no vitals taken for this visit. No LMP recorded. Patient has had a hysterectomy. General:   Alert,  Well-developed, well-nourished, pleasant and cooperative in NAD Head:  Normocephalic and atraumatic. Eyes:  Sclera clear, no icterus.   Conjunctiva pink. Ears:  Normal auditory acuity. Neck:  Supple; no masses or thyromegaly. Lungs:  Respirations even and unlabored.  Clear throughout to auscultation.   No wheezes, crackles,  or rhonchi. No acute distress. Heart:  Regular rate and rhythm; no murmurs, clicks, rubs, or gallops. Abdomen:  Normal bowel sounds.  No bruits.  Soft, positive tenderness to 1 finger palpation while flexing the abdominal wall muscles and non-distended without masses, hepatosplenomegaly or hernias noted.  No guarding or rebound tenderness.  Negative Carnett sign.   Rectal:  Deferred.  Pulses:  Normal pulses noted. Extremities:  No clubbing or edema.  No cyanosis. Neurologic:  Alert and oriented x3;  grossly normal neurologically. Skin:  Intact without significant lesions or rashes.  No jaundice. Lymph Nodes:  No significant cervical adenopathy. Psych:  Alert and cooperative. Normal mood and affect.  Imaging Studies: MM 3D SCREEN BREAST BILATERAL  Result Date: 10/24/2019 CLINICAL DATA:  Screening. EXAM: DIGITAL SCREENING BILATERAL MAMMOGRAM WITH TOMO AND CAD COMPARISON:  Previous exam(s). ACR Breast Density Category b: There are scattered areas of fibroglandular density. FINDINGS: In the left breast, a possible asymmetry warrants further evaluation. In the right breast, no findings suspicious for malignancy. Images were processed with CAD. IMPRESSION: Further evaluation is suggested for possible asymmetry in the left breast. RECOMMENDATION: Diagnostic mammogram and possibly ultrasound of the left breast. (Code:[id number]) The patient will be contacted regarding  the findings, and additional imaging will be scheduled. BI-RADS CATEGORY  0: Incomplete. Need additional imaging evaluation and/or prior mammograms for comparison. Electronically Signed   By: Audie Pinto M.D.   On: 10/24/2019 15:22    Assessment and Plan:   Leslie Olsen is a 69 y.o. y/o female who comes in with multiple issues today.  She is having right upper quadrant pain that is worse when she turns over in bed or moves and on physical exam it is consistent with musculoskeletal pain.  As for the patient's weakness, tiredness and black stools she will be set up for blood work to check her TSH iron levels including ferritin and her CBC.  She will also have stool sent off for fecal fat due to what she describes as greasy stools.  The patient will also be set up for a breath test for bacterial overgrowth since she has a history of bacterial overgrowth and now is having similar symptoms.  The patient has been explained the plan and agrees with it she will be contacted with the results when they come in.    Lucilla Lame, MD. Marval Regal    Note: This dictation was prepared with Dragon dictation along with smaller phrase technology. Any transcriptional errors that result from this process are unintentional.

## 2019-11-03 ENCOUNTER — Other Ambulatory Visit: Payer: Self-pay

## 2019-11-03 ENCOUNTER — Inpatient Hospital Stay
Admission: EM | Admit: 2019-11-03 | Discharge: 2019-11-06 | DRG: 378 | Disposition: A | Discharge status: Home / Self Care | Payer: PPO | Attending: Internal Medicine | Admitting: Internal Medicine

## 2019-11-03 ENCOUNTER — Encounter: Payer: Self-pay | Admitting: Emergency Medicine

## 2019-11-03 DIAGNOSIS — K649 Unspecified hemorrhoids: Secondary | ICD-10-CM | POA: Diagnosis not present

## 2019-11-03 DIAGNOSIS — Z7901 Long term (current) use of anticoagulants: Secondary | ICD-10-CM | POA: Diagnosis not present

## 2019-11-03 DIAGNOSIS — D62 Acute posthemorrhagic anemia: Secondary | ICD-10-CM | POA: Diagnosis not present

## 2019-11-03 DIAGNOSIS — G4733 Obstructive sleep apnea (adult) (pediatric): Secondary | ICD-10-CM | POA: Diagnosis present

## 2019-11-03 DIAGNOSIS — E039 Hypothyroidism, unspecified: Secondary | ICD-10-CM | POA: Diagnosis not present

## 2019-11-03 DIAGNOSIS — Z20822 Contact with and (suspected) exposure to covid-19: Secondary | ICD-10-CM | POA: Diagnosis present

## 2019-11-03 DIAGNOSIS — K921 Melena: Secondary | ICD-10-CM | POA: Diagnosis not present

## 2019-11-03 DIAGNOSIS — Z803 Family history of malignant neoplasm of breast: Secondary | ICD-10-CM | POA: Diagnosis not present

## 2019-11-03 DIAGNOSIS — E1143 Type 2 diabetes mellitus with diabetic autonomic (poly)neuropathy: Secondary | ICD-10-CM | POA: Diagnosis present

## 2019-11-03 DIAGNOSIS — Z801 Family history of malignant neoplasm of trachea, bronchus and lung: Secondary | ICD-10-CM | POA: Diagnosis not present

## 2019-11-03 DIAGNOSIS — K6389 Other specified diseases of intestine: Secondary | ICD-10-CM | POA: Diagnosis not present

## 2019-11-03 DIAGNOSIS — K5521 Angiodysplasia of colon with hemorrhage: Secondary | ICD-10-CM | POA: Diagnosis not present

## 2019-11-03 DIAGNOSIS — G40909 Epilepsy, unspecified, not intractable, without status epilepticus: Secondary | ICD-10-CM

## 2019-11-03 DIAGNOSIS — Z87891 Personal history of nicotine dependence: Secondary | ICD-10-CM | POA: Diagnosis not present

## 2019-11-03 DIAGNOSIS — Z87898 Personal history of other specified conditions: Secondary | ICD-10-CM | POA: Diagnosis not present

## 2019-11-03 DIAGNOSIS — Z7982 Long term (current) use of aspirin: Secondary | ICD-10-CM

## 2019-11-03 DIAGNOSIS — D649 Anemia, unspecified: Secondary | ICD-10-CM | POA: Diagnosis not present

## 2019-11-03 DIAGNOSIS — Z96659 Presence of unspecified artificial knee joint: Secondary | ICD-10-CM | POA: Diagnosis present

## 2019-11-03 DIAGNOSIS — N182 Chronic kidney disease, stage 2 (mild): Secondary | ICD-10-CM | POA: Diagnosis not present

## 2019-11-03 DIAGNOSIS — E785 Hyperlipidemia, unspecified: Secondary | ICD-10-CM | POA: Diagnosis present

## 2019-11-03 DIAGNOSIS — Z7984 Long term (current) use of oral hypoglycemic drugs: Secondary | ICD-10-CM

## 2019-11-03 DIAGNOSIS — K5520 Angiodysplasia of colon without hemorrhage: Secondary | ICD-10-CM | POA: Diagnosis not present

## 2019-11-03 DIAGNOSIS — K3184 Gastroparesis: Secondary | ICD-10-CM | POA: Diagnosis present

## 2019-11-03 DIAGNOSIS — Z79890 Hormone replacement therapy (postmenopausal): Secondary | ICD-10-CM

## 2019-11-03 DIAGNOSIS — K449 Diaphragmatic hernia without obstruction or gangrene: Secondary | ICD-10-CM | POA: Diagnosis present

## 2019-11-03 DIAGNOSIS — Z8349 Family history of other endocrine, nutritional and metabolic diseases: Secondary | ICD-10-CM

## 2019-11-03 DIAGNOSIS — J449 Chronic obstructive pulmonary disease, unspecified: Secondary | ICD-10-CM | POA: Diagnosis present

## 2019-11-03 DIAGNOSIS — I2510 Atherosclerotic heart disease of native coronary artery without angina pectoris: Secondary | ICD-10-CM | POA: Diagnosis present

## 2019-11-03 DIAGNOSIS — I10 Essential (primary) hypertension: Secondary | ICD-10-CM | POA: Diagnosis not present

## 2019-11-03 DIAGNOSIS — Z79899 Other long term (current) drug therapy: Secondary | ICD-10-CM

## 2019-11-03 DIAGNOSIS — E1122 Type 2 diabetes mellitus with diabetic chronic kidney disease: Secondary | ICD-10-CM | POA: Diagnosis not present

## 2019-11-03 DIAGNOSIS — E119 Type 2 diabetes mellitus without complications: Secondary | ICD-10-CM | POA: Diagnosis not present

## 2019-11-03 DIAGNOSIS — I129 Hypertensive chronic kidney disease with stage 1 through stage 4 chronic kidney disease, or unspecified chronic kidney disease: Secondary | ICD-10-CM | POA: Diagnosis not present

## 2019-11-03 DIAGNOSIS — K219 Gastro-esophageal reflux disease without esophagitis: Secondary | ICD-10-CM | POA: Diagnosis not present

## 2019-11-03 DIAGNOSIS — Z955 Presence of coronary angioplasty implant and graft: Secondary | ICD-10-CM | POA: Diagnosis not present

## 2019-11-03 DIAGNOSIS — K222 Esophageal obstruction: Secondary | ICD-10-CM | POA: Diagnosis present

## 2019-11-03 DIAGNOSIS — K5731 Diverticulosis of large intestine without perforation or abscess with bleeding: Secondary | ICD-10-CM | POA: Diagnosis present

## 2019-11-03 DIAGNOSIS — K922 Gastrointestinal hemorrhage, unspecified: Secondary | ICD-10-CM | POA: Diagnosis not present

## 2019-11-03 DIAGNOSIS — Z7902 Long term (current) use of antithrombotics/antiplatelets: Secondary | ICD-10-CM

## 2019-11-03 DIAGNOSIS — K31811 Angiodysplasia of stomach and duodenum with bleeding: Principal | ICD-10-CM | POA: Diagnosis present

## 2019-11-03 DIAGNOSIS — Z90710 Acquired absence of both cervix and uterus: Secondary | ICD-10-CM

## 2019-11-03 DIAGNOSIS — F418 Other specified anxiety disorders: Secondary | ICD-10-CM | POA: Diagnosis not present

## 2019-11-03 DIAGNOSIS — E1129 Type 2 diabetes mellitus with other diabetic kidney complication: Secondary | ICD-10-CM | POA: Diagnosis present

## 2019-11-03 DIAGNOSIS — D509 Iron deficiency anemia, unspecified: Secondary | ICD-10-CM | POA: Diagnosis present

## 2019-11-03 DIAGNOSIS — K5730 Diverticulosis of large intestine without perforation or abscess without bleeding: Secondary | ICD-10-CM | POA: Diagnosis not present

## 2019-11-03 DIAGNOSIS — Z01810 Encounter for preprocedural cardiovascular examination: Secondary | ICD-10-CM | POA: Diagnosis not present

## 2019-11-03 DIAGNOSIS — K31819 Angiodysplasia of stomach and duodenum without bleeding: Secondary | ICD-10-CM | POA: Diagnosis not present

## 2019-11-03 DIAGNOSIS — Q2733 Arteriovenous malformation of digestive system vessel: Secondary | ICD-10-CM | POA: Diagnosis not present

## 2019-11-03 LAB — CBC WITH DIFFERENTIAL/PLATELET
Abs Immature Granulocytes: 0.01 10*3/uL (ref 0.00–0.07)
Basophils Absolute: 0.1 10*3/uL (ref 0.0–0.2)
Basophils Absolute: 0.1 10*3/uL (ref 0.0–0.1)
Basophils Relative: 1 %
Basos: 1 %
EOS (ABSOLUTE): 0.3 10*3/uL (ref 0.0–0.4)
Eos: 5 %
Eosinophils Absolute: 0.2 10*3/uL (ref 0.0–0.5)
Eosinophils Relative: 3 %
HCT: 24.2 % — ABNORMAL LOW (ref 36.0–46.0)
Hematocrit: 24 % — ABNORMAL LOW (ref 34.0–46.6)
Hemoglobin: 7.8 g/dL — ABNORMAL LOW (ref 11.1–15.9)
Hemoglobin: 7.5 g/dL — ABNORMAL LOW (ref 12.0–15.0)
Immature Grans (Abs): 0 10*3/uL (ref 0.0–0.1)
Immature Granulocytes: 0 %
Immature Granulocytes: 0 %
Lymphocytes Absolute: 1.7 10*3/uL (ref 0.7–3.1)
Lymphocytes Relative: 31 %
Lymphs Abs: 1.6 10*3/uL (ref 0.7–4.0)
Lymphs: 28 %
MCH: 29.3 pg (ref 26.6–33.0)
MCH: 28.8 pg (ref 26.0–34.0)
MCHC: 32.5 g/dL (ref 31.5–35.7)
MCHC: 31 g/dL (ref 30.0–36.0)
MCV: 90 fL (ref 79–97)
MCV: 93.1 fL (ref 80.0–100.0)
Monocytes Absolute: 0.6 10*3/uL (ref 0.1–0.9)
Monocytes Absolute: 0.4 10*3/uL (ref 0.1–1.0)
Monocytes Relative: 8 %
Monocytes: 10 %
Neutro Abs: 3 10*3/uL (ref 1.7–7.7)
Neutrophils Absolute: 3.3 10*3/uL (ref 1.4–7.0)
Neutrophils Relative %: 57 %
Neutrophils: 56 %
Platelets: 340 10*3/uL (ref 150–450)
Platelets: 325 10*3/uL (ref 150–400)
RBC: 2.66 x10E6/uL — CL (ref 3.77–5.28)
RBC: 2.6 MIL/uL — ABNORMAL LOW (ref 3.87–5.11)
RDW: 12.4 % (ref 11.7–15.4)
RDW: 12.6 % (ref 11.5–15.5)
WBC: 5.9 10*3/uL (ref 3.4–10.8)
WBC: 5.4 10*3/uL (ref 4.0–10.5)
nRBC: 0 % (ref 0.0–0.2)

## 2019-11-03 LAB — LACTATE DEHYDROGENASE: LDH: 141 U/L (ref 98–192)

## 2019-11-03 LAB — IRON AND TIBC
Iron Saturation: 5 % — CL (ref 15–55)
Iron: 17 ug/dL — ABNORMAL LOW (ref 27–139)
Total Iron Binding Capacity: 353 ug/dL (ref 250–450)
UIBC: 336 ug/dL (ref 118–369)

## 2019-11-03 LAB — COMPREHENSIVE METABOLIC PANEL
ALT: 15 U/L (ref 0–44)
AST: 23 U/L (ref 15–41)
Albumin: 4.3 g/dL (ref 3.5–5.0)
Alkaline Phosphatase: 75 U/L (ref 38–126)
Anion gap: 6 (ref 5–15)
BUN: 29 mg/dL — ABNORMAL HIGH (ref 8–23)
CO2: 22 mmol/L (ref 22–32)
Calcium: 9.1 mg/dL (ref 8.9–10.3)
Chloride: 107 mmol/L (ref 98–111)
Creatinine, Ser: 1.17 mg/dL — ABNORMAL HIGH (ref 0.44–1.00)
GFR calc Af Amer: 55 mL/min — ABNORMAL LOW (ref >60)
GFR calc non Af Amer: 47 mL/min — ABNORMAL LOW (ref >60)
Glucose, Bld: 135 mg/dL — ABNORMAL HIGH (ref 70–99)
Potassium: 4.9 mmol/L (ref 3.5–5.1)
Sodium: 135 mmol/L (ref 135–145)
Total Bilirubin: 0.5 mg/dL (ref 0.3–1.2)
Total Protein: 7.4 g/dL (ref 6.5–8.1)

## 2019-11-03 LAB — PROTIME-INR
INR: 0.9 (ref 0.8–1.2)
Prothrombin Time: 12.4 seconds (ref 11.4–15.2)

## 2019-11-03 LAB — GLUCOSE, CAPILLARY
Glucose-Capillary: 91 mg/dL (ref 70–99)
Glucose-Capillary: 88 mg/dL (ref 70–99)

## 2019-11-03 LAB — FERRITIN: Ferritin: 15 ng/mL (ref 15–150)

## 2019-11-03 LAB — SARS CORONAVIRUS 2 (TAT 6-24 HRS): SARS Coronavirus 2: NEGATIVE

## 2019-11-03 LAB — CBC
HCT: 27.8 % — ABNORMAL LOW (ref 36.0–46.0)
Hemoglobin: 8.6 g/dL — ABNORMAL LOW (ref 12.0–15.0)
MCH: 28.1 pg (ref 26.0–34.0)
MCHC: 30.9 g/dL (ref 30.0–36.0)
MCV: 90.8 fL (ref 80.0–100.0)
Platelets: 311 10*3/uL (ref 150–400)
RBC: 3.06 MIL/uL — ABNORMAL LOW (ref 3.87–5.11)
RDW: 13.6 % (ref 11.5–15.5)
WBC: 5.9 10*3/uL (ref 4.0–10.5)
nRBC: 0 % (ref 0.0–0.2)

## 2019-11-03 LAB — TSH: TSH: 1.64 u[IU]/mL (ref 0.450–4.500)

## 2019-11-03 LAB — PREPARE RBC (CROSSMATCH)

## 2019-11-03 LAB — BRAIN NATRIURETIC PEPTIDE: B Natriuretic Peptide: 63 pg/mL (ref 0.0–100.0)

## 2019-11-03 LAB — ABO/RH: ABO/RH(D): O POS

## 2019-11-03 MED ORDER — SUMATRIPTAN SUCCINATE 50 MG PO TABS
100.0000 mg | ORAL_TABLET | ORAL | Status: DC | PRN
  Administered 2019-11-04 – 2019-11-05 (×2): 100 mg via ORAL
  Filled 2019-11-03 (×4): qty 2

## 2019-11-03 MED ORDER — PANTOPRAZOLE SODIUM 40 MG IV SOLR: 40.0000 mg | Freq: Two times a day (BID) | INTRAVENOUS | Status: DC

## 2019-11-03 MED ORDER — ACETAMINOPHEN 325 MG PO TABS
650.0000 mg | ORAL_TABLET | Freq: Four times a day (QID) | ORAL | Status: DC | PRN
  Administered 2019-11-03: 21:00:00 650 mg via ORAL
  Filled 2019-11-03: qty 2

## 2019-11-03 MED ORDER — ONDANSETRON HCL 4 MG/2ML IJ SOLN: 4.0000 mg | Freq: Three times a day (TID) | INTRAMUSCULAR | Status: DC | PRN

## 2019-11-03 MED ORDER — LEVETIRACETAM 500 MG PO TABS
500.0000 mg | ORAL_TABLET | Freq: Every day | ORAL | Status: DC
  Administered 2019-11-04 – 2019-11-05 (×2): 500 mg via ORAL
  Filled 2019-11-03 (×2): qty 1

## 2019-11-03 MED ORDER — INSULIN ASPART 100 UNIT/ML ~~LOC~~ SOLN: 0.0000 [IU] | Freq: Every day | SUBCUTANEOUS | Status: DC

## 2019-11-03 MED ORDER — LISINOPRIL 10 MG PO TABS
10.0000 mg | ORAL_TABLET | Freq: Every day | ORAL | Status: DC
  Administered 2019-11-04 – 2019-11-06 (×3): 10 mg via ORAL
  Filled 2019-11-03 (×3): qty 1

## 2019-11-03 MED ORDER — VITAMIN D 25 MCG (1000 UNIT) PO TABS
2000.0000 [IU] | ORAL_TABLET | Freq: Every day | ORAL | Status: DC
  Administered 2019-11-04 – 2019-11-06 (×3): 2000 [IU] via ORAL
  Filled 2019-11-03 (×3): qty 2

## 2019-11-03 MED ORDER — ALBUTEROL SULFATE (2.5 MG/3ML) 0.083% IN NEBU: 2.5000 mg | INHALATION_SOLUTION | RESPIRATORY_TRACT | Status: DC | PRN

## 2019-11-03 MED ORDER — HYDRALAZINE HCL 25 MG PO TABS: 25.0000 mg | ORAL_TABLET | Freq: Three times a day (TID) | ORAL | Status: DC | PRN

## 2019-11-03 MED ORDER — CLONAZEPAM 0.5 MG PO TABS
0.5000 mg | ORAL_TABLET | Freq: Every day | ORAL | Status: DC
  Administered 2019-11-03 – 2019-11-05 (×2): 0.5 mg via ORAL
  Filled 2019-11-03 (×2): qty 1

## 2019-11-03 MED ORDER — LEVOTHYROXINE SODIUM 100 MCG PO TABS
100.0000 ug | ORAL_TABLET | Freq: Every day | ORAL | Status: DC
  Administered 2019-11-04 – 2019-11-06 (×2): 100 ug via ORAL
  Filled 2019-11-03 (×2): qty 2
  Filled 2019-11-03: qty 1

## 2019-11-03 MED ORDER — SODIUM CHLORIDE 0.9 % IV BOLUS
500.0000 mL | Freq: Once | INTRAVENOUS | Status: AC
  Administered 2019-11-03: 13:00:00 500 mL via INTRAVENOUS

## 2019-11-03 MED ORDER — SODIUM CHLORIDE 0.9 % IV SOLN
8.0000 mg/h | INTRAVENOUS | Status: DC
  Administered 2019-11-03 – 2019-11-05 (×5): 8 mg/h via INTRAVENOUS
  Filled 2019-11-03 (×5): qty 80

## 2019-11-03 MED ORDER — INSULIN ASPART 100 UNIT/ML ~~LOC~~ SOLN
0.0000 [IU] | Freq: Three times a day (TID) | SUBCUTANEOUS | Status: DC
  Administered 2019-11-04 – 2019-11-05 (×2): 1 [IU] via SUBCUTANEOUS
  Filled 2019-11-03 (×3): qty 1

## 2019-11-03 MED ORDER — LEVETIRACETAM 500 MG PO TABS
500.0000 mg | ORAL_TABLET | Freq: Every day | ORAL | Status: DC
  Administered 2019-11-03: 21:00:00 500 mg via ORAL
  Filled 2019-11-03: qty 1

## 2019-11-03 MED ORDER — PANTOPRAZOLE SODIUM 40 MG IV SOLR
40.0000 mg | Freq: Once | INTRAVENOUS | Status: AC
  Administered 2019-11-03: 13:00:00 40 mg via INTRAVENOUS
  Filled 2019-11-03: qty 40

## 2019-11-03 MED ORDER — SODIUM CHLORIDE 0.9 % IV SOLN: INTRAVENOUS | Status: DC

## 2019-11-03 MED ORDER — SODIUM CHLORIDE 0.9 % IV SOLN
10.0000 mL/h | Freq: Once | INTRAVENOUS | Status: AC
  Administered 2019-11-03: 10 mL/h via INTRAVENOUS

## 2019-11-03 MED ORDER — FERROUS GLUCONATE 324 (38 FE) MG PO TABS
324.0000 mg | ORAL_TABLET | Freq: Every day | ORAL | Status: DC
  Administered 2019-11-04 – 2019-11-06 (×3): 324 mg via ORAL
  Filled 2019-11-03 (×3): qty 1

## 2019-11-03 NOTE — ED Provider Notes (Signed)
[facility] Emergency Department Provider Note  ____________________________________________  Time seen: Approximately 12:47 PM  I have reviewed the triage vital signs and the nursing notes.   HISTORY  Chief Complaint Anemia    HPI Leslie Olsen is a 69 y.o. female with a history of COPD diabetes GERD hypertension CAD with a recent history of RCA stent 3 months ago with daily Effient use who reports that over the last 3 months she has had gradually worsening symptoms of fatigue and malaise.  In the last 2 weeks she is also noticed black tarry stool.  She gets lightheaded when she stands up and feels short of breath when she walks.  Denies chest pain.  Has some upper abdominal discomfort which is intermittent and without aggravating or alleviating factors.  She does note decreased oral intake over the past few weeks due to malaise.  No vomiting.  She saw GI yesterday who called her and told her that her hemoglobin was significantly lower and that she should come to the ED for evaluation.  GI notes reviewed.  Patient has a long history of hiatal hernia and gastritis as well as recurrent diarrhea from bacterial overgrowth.   Denies trauma.   Past Medical History:  Diagnosis Date  . Anemia   . Cephalalgia 04/08/2015  . Chronic headaches   . COPD (chronic obstructive pulmonary disease) ([place])   . Diabetes mellitus without complication ([place])   . Fatigue due to excessive exertion 08/22/2018  . GERD (gastroesophageal reflux disease)   . Hypertension   . Hypothyroidism   . RMSF [facility] spotted fever) 07/27/2016     Patient Active Problem List   Diagnosis Date Noted  . CAD (coronary artery disease) 07/31/2019  . 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 Coronary artery disease due to lipid rich plaque 08/22/2018  . Excessive daytime sleepiness 08/22/2018  . Type 2 diabetes mellitus with diabetic autonomic  neuropathy, without long-term current use of insulin ([place]) 06/29/2018  . DNR (do not resuscitate) 09/27/2017  . Gastroparesis 02/16/2017  . Adenoma of liver 02/16/2017  . Myoclonus 02/16/2017  . Malnutrition ([place]) 02/16/2017  . Adrenal adenoma 02/16/2017  . Angiodysplasia of stomach and duodenum with hemorrhage   . Esophagitis, unspecified   . Dysphonia spastica 05/01/2016  . Thyroid activity decreased 02/21/2016  . Atherosclerosis of native coronary artery of native heart without angina pectoris 12/30/2015  . Essential hypertension, benign 12/12/2015  . Obesity 12/12/2015  . Iron deficiency anemia 10/22/2015  . Occipital neuralgia 10/11/2015  . Hyperlipidemia 07/11/2015  . Anemia 05/23/2014  . Allergic rhinitis 01/09/2014  . OSA on CPAP 01/24/2013  . Multiple pulmonary nodules 11/02/2012  . COPD (chronic obstructive pulmonary disease) ([place]) 11/01/2012     Past Surgical History:  Procedure Laterality Date  . ABDOMINAL HYSTERECTOMY    . BACK SURGERY    . BREAST EXCISIONAL BIOPSY Left 1970's  . CHOLECYSTECTOMY    . CORONARY STENT INTERVENTION N/A 07/31/2019   Procedure: CORONARY STENT INTERVENTION;  Surgeon: Isaias Cowman, MD;  Location: [place] [facility];  Service: Cardiovascular;  Laterality: N/A;  RCA  . CORONARY STENT PLACEMENT    . ESOPHAGOGASTRODUODENOSCOPY (EGD) WITH PROPOFOL N/A 05/26/2016   Procedure: ESOPHAGOGASTRODUODENOSCOPY (EGD) WITH PROPOFOL;  Surgeon: Lucilla Lame, MD;  Location: [facility] ENDOSCOPY;  Service: Endoscopy;  Laterality: N/A;  . GIVENS CAPSULE STUDY N/A 05/05/2016   Procedure: GIVENS CAPSULE STUDY;  Surgeon: Lucilla Lame, MD;  Location: [facility] ENDOSCOPY;  Service:  Endoscopy;  Laterality: N/A;  . LEFT HEART CATH AND CORONARY ANGIOGRAPHY Left 07/31/2019   Procedure: LEFT HEART CATH AND CORONARY ANGIOGRAPHY;  Surgeon: Isaias Cowman, MD;  Location: [place] [facility];  Service: Cardiovascular;  Laterality: Left;  . REDUCTION MAMMAPLASTY    .  REPLACEMENT TOTAL KNEE    . SHOULDER SURGERY    . TIBIA FRACTURE SURGERY       Prior to Admission medications   Medication Sig Start Date End Date Taking? Authorizing Provider  aspirin 81 MG tablet Take 81 mg by mouth daily.    [provider]  clonazePAM (KLONOPIN) 0.5 MG tablet Take 0.5 mg by mouth at bedtime.  02/23/16   [provider]  ferrous gluconate (FERGON) 324 MG tablet Take 324 mg by mouth daily. 05/28/18   [provider]  glucose blood (ONE TOUCH ULTRA TEST) test strip USE ONE STRIP TO CHECK GLUCOSE TWICE DAILY 10/07/18   Johnson, Megan P, DO  levETIRAcetam (KEPPRA) 500 MG tablet Take 500 mg by mouth 2 (two) times daily.  10/03/17   [provider]  lisinopril (ZESTRIL) 5 MG tablet Take 1 tablet (5 mg total) by mouth daily. Patient taking differently: Take 10 mg by mouth daily.  01/06/19   Johnson, Megan P, DO  metFORMIN (GLUCOPHAGE) 500 MG tablet Take 1 tablet (500 mg total) by mouth daily with breakfast. Patient taking differently: Take 1,000 mg by mouth 2 (two) times daily with a meal.  01/06/19   Johnson, Megan P, DO  oxybutynin (DITROPAN XL) 15 MG 24 hr tablet Take 1 tablet (15 mg total) by mouth daily. Patient not taking: Reported on 11/02/2019 05/02/19   Billey Co, MD  pantoprazole (PROTONIX) 40 MG tablet Take 40 mg by mouth daily as needed (Heartburn).  05/28/18   [provider]  prasugrel (EFFIENT) 10 MG TABS tablet Take 1 tablet (10 mg total) by mouth daily. 08/01/19   Paraschos, Alexander, MD  SUMAtriptan (IMITREX) 100 MG tablet Take 1 tablet (100 mg total) by mouth every 2 (two) hours as needed for migraine. May repeat in 2 hours if headache persists or recurs. 10/07/18   Johnson, Megan P, DO  SYNTHROID 100 MCG tablet Take 1 tablet (100 mcg total) by mouth daily before breakfast. 10/07/18   Park Liter P, DO     Allergies Patient has no known allergies.   Family History  Problem Relation Age of Onset  . Lung cancer  Maternal Aunt   . Breast cancer Maternal Aunt 57  . Other Mother        Killed in car accident, 33  . Other Father        Killed in car accident, 33  . Thyroid disease Brother   . Thyroid disease Sister     Social History Social History   Tobacco Use  . Smoking status: Former Smoker    Packs/day: 2.00    Years: 45.00    Pack years: 90.00    Types: Cigarettes    Quit date: 11/06/2011    Years since quitting: 7.9  . Smokeless tobacco: Never Used  Substance Use Topics  . Alcohol use: No  . Drug use: No    Review of Systems  Constitutional:   No fever or chills.  ENT:   No sore throat. No rhinorrhea. Cardiovascular:   No chest pain or syncope. Respiratory:   No dyspnea or cough. Gastrointestinal:   Positive abdominal pain and black loose stools.  No vomiting. Musculoskeletal:  Negative for focal pain or swelling All other systems reviewed and are negative except as documented above in ROS and HPI.  ____________________________________________   PHYSICAL EXAM:  VITAL SIGNS: ED Triage Vitals  Enc Vitals Group     BP 11/03/19 1124 138/85     Pulse Rate 11/03/19 1124 (!) 105     Resp 11/03/19 1124 14     Temp 11/03/19 1124 98.4 F (36.9 C)     Temp Source 11/03/19 1124 Oral     SpO2 11/03/19 1124 100 %     Weight 11/03/19 1125 190 lb 4.1 oz (86.3 kg)     Height 11/03/19 1125 5\' 6"  (1.676 m)     Head Circumference --      Peak Flow --      Pain Score 11/03/19 1125 0     Pain Loc --      Pain Edu? --      Excl. in [place]? --     Vital signs reviewed, nursing assessments reviewed.   Constitutional:   Alert and oriented. Non-toxic appearance. Eyes:   Conjunctivae are normal. EOMI. PERRL. ENT      Head:   Normocephalic and atraumatic.      Nose:   Wearing a mask.      Mouth/Throat:   Wearing a mask.      Neck:   No meningismus. Full ROM. Hematological/Lymphatic/Immunilogical:   No cervical lymphadenopathy. Cardiovascular:   RRR. Symmetric bilateral radial and DP  pulses.  No murmurs. Cap refill less than 2 seconds. Respiratory:   Normal respiratory effort without tachypnea/retractions. Breath sounds are clear and equal bilaterally. No wheezes/rales/rhonchi. Gastrointestinal:   Soft with mild left upper quadrant tenderness Non distended. There is no CVA tenderness.  No rebound, rigidity, or guarding.  Rectal exam performed with nurse [place] at bedside, reveals melanotic stool that is strongly Hemoccult positive Musculoskeletal:   Normal range of motion in all extremities. No joint effusions.  No lower extremity tenderness.  No edema. Neurologic:   Normal speech and language.  Motor grossly intact. No acute focal neurologic deficits are appreciated.  Skin:    Skin is warm, dry and intact. No rash noted.  No petechiae, purpura, or bullae.  ____________________________________________    LABS (pertinent positives/negatives) (all labs ordered are listed, but only abnormal results are displayed) Labs Reviewed  SARS CORONAVIRUS 2 (TAT 6-24 HRS)  CBC WITH DIFFERENTIAL/PLATELET  PROTIME-INR  LACTATE DEHYDROGENASE  HAPTOGLOBIN  COMPREHENSIVE METABOLIC PANEL  PREPARE RBC (CROSSMATCH)  TYPE AND SCREEN   ____________________________________________   EKG    ____________________________________________    RADIOLOGY  No results found.  ____________________________________________   PROCEDURES .Critical Care Performed by: Carrie Mew, MD Authorized by: Carrie Mew, MD   Critical care provider statement:    Critical care time (minutes):  35   Critical care time was exclusive of:  Separately billable procedures and treating other patients   Critical care was necessary to treat or prevent imminent or life-threatening deterioration of the following conditions:  Circulatory failure   Critical care was time spent personally by me on the following activities:  Development of treatment plan with patient or surrogate, discussions with  consultants, evaluation of patient's response to treatment, examination of patient, obtaining history from patient or surrogate, ordering and performing treatments and interventions, ordering and review of laboratory studies, ordering and review of radiographic studies, pulse oximetry, re-evaluation of patient's condition and review of old charts    ____________________________________________    [place] /  ASSESSMENT AND PLAN / ED COURSE  Medications ordered in the ED: Medications  0.9 %  sodium chloride infusion (has no administration in time range)  pantoprazole (PROTONIX) injection 40 mg (has no administration in time range)  sodium chloride 0.9 % bolus 500 mL (500 mLs Intravenous New Bag/Given 11/03/19 1251)    Pertinent labs & imaging results that were available during my care of the patient were reviewed by me and considered in my medical decision making (see chart for details).  Leslie Olsen was evaluated in Emergency Department on 11/03/2019 for the symptoms described in the history of present illness. She was evaluated in the context of the global COVID-19 pandemic, which necessitated consideration that the patient might be at risk for infection with the SARS-CoV-2 virus that causes COVID-19. Institutional protocols and algorithms that pertain to the evaluation of patients at risk for COVID-19 are in a state of rapid change based on information released by regulatory bodies including the CDC and federal and state organizations. These policies and algorithms were followed during the patient's care in the ED.   Patient presents with clinically apparent GI bleed, upper GI bleeding source most likely.  She has melanotic stool which is strongly Hemoccult positive.  She reports a history of gastric AVM which required cauterization.  She is symptomatic from anemia with a four-point drop in her hemoglobin in the last 3 months.  This is complicated by Effient use related to recent  coronary artery stenting 3 months ago.  Will order 1 unit red blood cell transfusion for hemodynamic support, admit to hospital for GI and cardiology consultation.      ____________________________________________   FINAL CLINICAL IMPRESSION(S) / ED DIAGNOSES    Final diagnoses:  Acute upper GI bleed  Symptomatic anemia  Type 2 diabetes mellitus without complication, without long-term current use of insulin ([place])  Seizure disorder [facility])     ED Discharge Orders    None      Portions of this note were generated with dragon dictation software. Dictation errors may occur despite best attempts at proofreading.   Carrie Mew, MD 11/03/19 1254

## 2019-11-03 NOTE — ED Triage Notes (Signed)
Arrives for ED evaluation and transfusion for low H/H.  Labs 7.8/24.  Sent by GI.  AAOx3.  Skin warm and dry. NAD

## 2019-11-03 NOTE — H&P (View-Only) (Signed)
GI Inpatient Consult Note  Reason for Consult: Melena   Attending Requesting Consult: Dr. Ivor Costa, MD  History of Present Illness: Leslie Olsen is a 69 y.o. female seen for evaluation of melena at the request of Dr. Blaine Hamper. Pt has a PMH of CAD s/p stent 08/2017 and 07/2019, HTN, HLD, COPD, diabetes c/b gastroparesis, Hx of SIBO, Hx of RMSF, and GERD who presented to the [facility] ED for marked anemia at request of her gastroenterologist - Dr. Allen Norris. Patient was seen as an outpatient by Dr. Allen Norris yesterday where labs showed marked anemia with hemoglobin 7.8, hematocrit 24, iron sat 5%, ferritin 15. There is a 4-gram drop in her hemoglobin from 3 months ago. She reports over the past two months she has been experiencing dark and tarry stools daily, sometimes 2-3 stools daily. She reports she has noticed progressive shortness of breath, decreased energy levels, and paleness. She had stent placed 07/2019 and is on aspirin and Effient. She denies any nausea, vomiting, dysphagia, odynophagia, abdominal pain, early satiety, or unintentional weight loss. She reports some chronic musculoskeletal pain located in her RUQ which is intermittent in nature and seems to be aggravated when she bends over or twists. Pain is not alleviated with passing of a BM. Upon presentation to the ED today,  hemoglobin 7.5, INR 0.9. She has had one BM today which was black and tarry. She denies any bright red blood per rectum. She reports a history of small bowel AVM s/p APC 05/2016 by Dr. Allen Norris and also had two cecal AVMs which were treated with APC 01/2017 at [facility]. She is concerned this could be causing her bleeding again. Son is present in room during examination and reports he has been concerned about his mother over the past few months. Patient is receiving 1 unit pRBCs this afternoon during time of my examination.    Last Colonoscopy: 01/2017  - The examined portion of the ileum was normal.   - Diverticulosis in the entire examined  colon  - Two non-bleeding colonic angiodysplastic lesions in cecum s/p APC - Four polyps in the descending/transverse colon - SSA   Last Endoscopy:   VCE 04/2016: Gastric erosion and healing ulcer, multiple small and medium sized AVMs   EGD 05/2016:  - Multiple bleeding angiodysplastic lesions in the duodenum. Treated with argon beam coagulation. - Multiple bleeding angiodysplastic lesions in the duodenum. Treated with argon beam coagulation. - Normal stomach. - Medium-sized hiatal hernia. - LA Grade A esophagitis. Biopsied.   Past Medical History:  Past Medical History:  Diagnosis Date  . Anemia   . Cephalalgia 04/08/2015  . Chronic headaches   . COPD (chronic obstructive pulmonary disease) ([place])   . Diabetes mellitus without complication ([place])   . Fatigue due to excessive exertion 08/22/2018  . GERD (gastroesophageal reflux disease)   . Hypertension   . Hypothyroidism   . RMSF [facility] spotted fever) 07/27/2016    Problem List: Patient Active Problem List   Diagnosis Date Noted  . GIB (gastrointestinal bleeding) 11/03/2019  . Type II diabetes mellitus with renal manifestations ([place]) 11/03/2019  . Symptomatic anemia   . Type 2 diabetes mellitus without complication, without long-term current use of insulin ([place])   . Hypothyroidism   . CAD (coronary artery disease) 07/31/2019  . 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 Coronary artery disease due to lipid rich plaque 08/22/2018  . Excessive daytime sleepiness 08/22/2018  . Type 2 diabetes mellitus with  diabetic autonomic neuropathy, without long-term current use of insulin ([place]) 06/29/2018  . DNR (do not resuscitate) 09/27/2017  . Gastroparesis 02/16/2017  . Adenoma of liver 02/16/2017  . Myoclonus 02/16/2017  . Malnutrition ([place]) 02/16/2017  . Adrenal adenoma 02/16/2017  . Angiodysplasia of stomach and duodenum with hemorrhage   . Esophagitis, unspecified   .  Dysphonia spastica 05/01/2016  . Thyroid activity decreased 02/21/2016  . Atherosclerosis of native coronary artery of native heart without angina pectoris 12/30/2015  . Essential hypertension, benign 12/12/2015  . Obesity 12/12/2015  . Iron deficiency anemia 10/22/2015  . Occipital neuralgia 10/11/2015  . Hyperlipidemia 07/11/2015  . Anemia 05/23/2014  . Allergic rhinitis 01/09/2014  . OSA on CPAP 01/24/2013  . Multiple pulmonary nodules 11/02/2012  . COPD (chronic obstructive pulmonary disease) ([place]) 11/01/2012    Past Surgical History: Past Surgical History:  Procedure Laterality Date  . ABDOMINAL HYSTERECTOMY    . BACK SURGERY    . BREAST EXCISIONAL BIOPSY Left 1970's  . CHOLECYSTECTOMY    . CORONARY STENT INTERVENTION N/A 07/31/2019   Procedure: CORONARY STENT INTERVENTION;  Surgeon: Isaias Cowman, MD;  Location: [place] [facility];  Service: Cardiovascular;  Laterality: N/A;  RCA  . CORONARY STENT PLACEMENT    . ESOPHAGOGASTRODUODENOSCOPY (EGD) WITH PROPOFOL N/A 05/26/2016   Procedure: ESOPHAGOGASTRODUODENOSCOPY (EGD) WITH PROPOFOL;  Surgeon: Lucilla Lame, MD;  Location: [facility] ENDOSCOPY;  Service: Endoscopy;  Laterality: N/A;  . GIVENS CAPSULE STUDY N/A 05/05/2016   Procedure: GIVENS CAPSULE STUDY;  Surgeon: Lucilla Lame, MD;  Location: [facility] ENDOSCOPY;  Service: Endoscopy;  Laterality: N/A;  . LEFT HEART CATH AND CORONARY ANGIOGRAPHY Left 07/31/2019   Procedure: LEFT HEART CATH AND CORONARY ANGIOGRAPHY;  Surgeon: Isaias Cowman, MD;  Location: [place] [facility];  Service: Cardiovascular;  Laterality: Left;  . REDUCTION MAMMAPLASTY    . REPLACEMENT TOTAL KNEE    . SHOULDER SURGERY    . TIBIA FRACTURE SURGERY      Allergies: No Known Allergies  Home Medications: Medications Prior to Admission  Medication Sig Dispense Refill Last Dose  . aspirin 81 MG tablet Take 81 mg by mouth daily.   11/03/2019 at 0800  . cholecalciferol (VITAMIN D3) 25 MCG (1000 UNIT)  tablet Take 2,000 Units by mouth daily.   11/03/2019 at 0800  . clonazePAM (KLONOPIN) 0.5 MG tablet Take 0.5 mg by mouth at bedtime.    11/02/2019 at 2000  . ferrous gluconate (FERGON) 324 MG tablet Take 324 mg by mouth daily.  11 11/03/2019 at 0800  . levETIRAcetam (KEPPRA) 500 MG tablet Take 500 mg by mouth daily.    11/02/2019 at 2000  . lisinopril (ZESTRIL) 5 MG tablet Take 1 tablet (5 mg total) by mouth daily. (Patient taking differently: Take 10 mg by mouth daily. ) 90 tablet 1 11/03/2019 at 0800  . metFORMIN (GLUCOPHAGE) 500 MG tablet Take 1 tablet (500 mg total) by mouth daily with breakfast. (Patient taking differently: Take 1,000 mg by mouth 2 (two) times daily with a meal. ) 90 tablet 1 11/03/2019 at 0800  . pantoprazole (PROTONIX) 40 MG tablet Take 40 mg by mouth daily as needed (Heartburn).   3 prn at prn  . prasugrel (EFFIENT) 10 MG TABS tablet Take 1 tablet (10 mg total) by mouth daily. 30 tablet 5 11/03/2019 at 0800  . SUMAtriptan (IMITREX) 100 MG tablet Take 1 tablet (100 mg total) by mouth every 2 (two) hours as needed for migraine. May repeat in 2 hours if headache persists  or recurs. 10 tablet 12 prn at prn  . SYNTHROID 100 MCG tablet Take 1 tablet (100 mcg total) by mouth daily before breakfast. 90 tablet 3 11/03/2019 at 0800  . glucose blood (ONE TOUCH ULTRA TEST) test strip USE ONE STRIP TO CHECK GLUCOSE TWICE DAILY 100 each 12   . oxybutynin (DITROPAN XL) 15 MG 24 hr tablet Take 1 tablet (15 mg total) by mouth daily. (Patient not taking: Reported on 11/02/2019) 30 tablet 11 Not Taking at Unknown time   Home medication reconciliation was completed with the patient.   Scheduled Inpatient Medications:   . [START ON 11/04/2019] cholecalciferol  2,000 Units Oral Daily  . clonazePAM  0.5 mg Oral QHS  . [START ON 11/04/2019] ferrous gluconate  324 mg Oral Daily  . insulin aspart  0-5 Units Subcutaneous QHS  . insulin aspart  0-9 Units Subcutaneous TID WC  . levETIRAcetam  500 mg Oral  Daily  . [START ON 11/04/2019] levothyroxine  100 mcg Oral Q0600  . [START ON 11/04/2019] lisinopril  10 mg Oral Daily  . [START ON 11/07/2019] pantoprazole  40 mg Intravenous Q12H    Continuous Inpatient Infusions:   . sodium chloride    . pantoprozole (PROTONIX) infusion 8 mg/hr (11/03/19 1424)    PRN Inpatient Medications:  acetaminophen, hydrALAZINE, ondansetron (ZOFRAN) IV, SUMAtriptan  Family History: family history includes Breast cancer (age of onset: 28) in her maternal aunt; Lung cancer in her maternal aunt; Other in her father and mother; Thyroid disease in her brother and sister.  The patient's family history is negative for inflammatory bowel disorders, GI malignancy, or solid organ transplantation.  Social History:   reports that she quit smoking about 7 years ago. Her smoking use included cigarettes. She has a 90.00 pack-year smoking history. She has never used smokeless tobacco. She reports that she does not drink alcohol or use drugs. The patient denies ETOH, tobacco, or drug use.   Review of Systems: Constitutional: Weight is stable.  Eyes: No changes in vision. ENT: No oral lesions, sore throat.  GI: see HPI.  Heme/Lymph: No easy bruising.  CV: No chest pain.  GU: No hematuria.  Integumentary: No rashes.  Neuro: No headaches.  Psych: No depression/anxiety.  Endocrine: No heat/cold intolerance.  Allergic/Immunologic: No urticaria.  Resp: No cough, SOB.  Musculoskeletal: No joint swelling.    Physical Examination: BP 123/64   Pulse 75   Temp 97.7 F (36.5 C) (Oral)   Resp 18   Ht 5\' 6"  (1.676 m)   Wt 86.3 kg   SpO2 100%   BMI 30.71 kg/m  Gen: NAD, alert and oriented x 4 HEENT: [place], EOMI, Neck: supple, no JVD or thyromegaly Chest: CTA bilaterally, no wheezes, crackles, or other adventitious sounds CV: RRR, no m/g/c/r Abd: soft, NT, ND, +BS in all four quadrants; no HSM, guarding, ridigity, or rebound tenderness Ext: no edema, well perfused with 2+  pulses, Skin: no rash or lesions noted Lymph: no LAD  Data: Lab Results  Component Value Date   WBC 5.4 11/03/2019   HGB 7.5 (L) 11/03/2019   HCT 24.2 (L) 11/03/2019   MCV 93.1 11/03/2019   PLT 325 11/03/2019   Recent Labs  Lab 11/02/19 1008 11/03/19 1221  HGB 7.8* 7.5*   Lab Results  Component Value Date   NA 135 11/03/2019   K 4.9 11/03/2019   CL 107 11/03/2019   CO2 22 11/03/2019   BUN 29 (H) 11/03/2019   CREATININE 1.17 (H) 11/03/2019  GLU 103 06/02/2017   Lab Results  Component Value Date   ALT 15 11/03/2019   AST 23 11/03/2019   ALKPHOS 75 11/03/2019   BILITOT 0.5 11/03/2019   Recent Labs  Lab 11/03/19 1221  INR 0.9   Assessment/Plan:  69 y/o Caucasian female with a PMH of CAD s/p stent 07/2019, HTN, HLD, COPD, diabetes c/b gastroparesis, Hx of SIBO, Hx of RMSF, and GERD admitted for symptomatic anemia and melena  1. Symptomatic anemia 2. Melena 3. Recent cardiac stenting 07/2019 on ASA and Effient 4. Hx of duodenal AVM s/p APC 5. Hx of cecal AVM s/p APC  -There is a 4-gram drop in hemoglobin over the past three months and findings of symptomatic anemia likely related to melena over the past two months -Differential includes small bowel/right colon AVMs, esophagitis, gastritis, PUD, duodenitis, polyp, malignancy -[place] with serial monitoring of H&H. Transfuse for Hgb <7 -Transfuse as necessary to ensure hgb >7 -Agree with acid suppression -Cardiology input is appreciated -Clear liquid diet -Cardiology following and appreciate input -Hold antiplatelet therapy in setting of GI bleed -Anticipate EGD and colonoscopy on Sunday with Dr. Alice Reichert -Following along with you    Thank you for the consult. Please call with questions or concerns.  Reeves Forth [facility] [phone] (Cell)

## 2019-11-03 NOTE — Consult Note (Signed)
[facility] Cardiology  CARDIOLOGY CONSULT NOTE  Patient ID: Leslie Olsen MRN: [id number] DOB/AGE: 10-10-1950 69 y.o.  Admit date: 11/03/2019 Referring Physician Blaine Hamper Primary Physician [facility] Primary Cardiologist Tag Wurtz Reason for Consultation coronary artery disease  HPI: 69 year old female referred for evaluation of coronary artery disease in the setting of GI bleed.  Patient has known coronary artery disease, status post coronary stent RCA 08/2017.  She underwent recent PCI receiving 2.75 x 15 mm Resolute Onyx drug-eluting stent distal RCA 07/31/2019.  The patient has been doing well from a cardiovascular perspective, without chest pain or shortness of breath, currently in cardiac rehabilitation.  Recently, the patient has been experiencing generalized fatigue, and postural lightheadedness as well as black tarry stools.  Is a history of small bowel arteriovenous malformation 2017.  She was seen by GI yesterday, and was noted to be markedly anemic, hemoglobin hematocrit of 7.8 and 24, respectively,  and was referred to [facility] emergency room.  Review of systems complete and found to be negative unless listed above     Past Medical History:  Diagnosis Date  . Anemia   . Cephalalgia 04/08/2015  . Chronic headaches   . COPD (chronic obstructive pulmonary disease) ([place])   . Diabetes mellitus without complication ([place])   . Fatigue due to excessive exertion 08/22/2018  . GERD (gastroesophageal reflux disease)   . Hypertension   . Hypothyroidism   . RMSF [facility] spotted fever) 07/27/2016    Past Surgical History:  Procedure Laterality Date  . ABDOMINAL HYSTERECTOMY    . BACK SURGERY    . BREAST EXCISIONAL BIOPSY Left 1970's  . CHOLECYSTECTOMY    . CORONARY STENT INTERVENTION N/A 07/31/2019   Procedure: CORONARY STENT INTERVENTION;  Surgeon: Isaias Cowman, MD;  Location: [place] [facility];  Service: Cardiovascular;  Laterality: N/A;  RCA  . CORONARY STENT PLACEMENT    .  ESOPHAGOGASTRODUODENOSCOPY (EGD) WITH PROPOFOL N/A 05/26/2016   Procedure: ESOPHAGOGASTRODUODENOSCOPY (EGD) WITH PROPOFOL;  Surgeon: Lucilla Lame, MD;  Location: [facility] ENDOSCOPY;  Service: Endoscopy;  Laterality: N/A;  . GIVENS CAPSULE STUDY N/A 05/05/2016   Procedure: GIVENS CAPSULE STUDY;  Surgeon: Lucilla Lame, MD;  Location: [facility] ENDOSCOPY;  Service: Endoscopy;  Laterality: N/A;  . LEFT HEART CATH AND CORONARY ANGIOGRAPHY Left 07/31/2019   Procedure: LEFT HEART CATH AND CORONARY ANGIOGRAPHY;  Surgeon: Isaias Cowman, MD;  Location: [place] [facility];  Service: Cardiovascular;  Laterality: Left;  . REDUCTION MAMMAPLASTY    . REPLACEMENT TOTAL KNEE    . SHOULDER SURGERY    . TIBIA FRACTURE SURGERY      (Not in a hospital admission)  Social History   Socioeconomic History  . Marital status: Divorced    Spouse name: Not on file  . Number of children: 1  . Years of education: 61  . Highest education level: 12th grade  Occupational History  . Occupation: retired   Tobacco Use  . Smoking status: Former Smoker    Packs/day: 2.00    Years: 45.00    Pack years: 90.00    Types: Cigarettes    Quit date: 11/06/2011    Years since quitting: 7.9  . Smokeless tobacco: Never Used  Substance and Sexual Activity  . Alcohol use: No  . Drug use: No  . Sexual activity: Never  Other Topics Concern  . Not on file  Social History Narrative   Lives alone.  She has one grown son.   She works as a Retail buyer at [employer]  Highest level of education was high school   Social Determinants of Health   Financial Resource Strain:   . Difficulty of Paying Living Expenses: Not on file  Food Insecurity:   . Worried About Charity fundraiser in the Last Year: Not on file  . Ran Out of Food in the Last Year: Not on file  Transportation Needs:   . Lack of Transportation (Medical): Not on file  . Lack of Transportation (Non-Medical): Not on file  Physical Activity:   . Days of Exercise per  Week: Not on file  . Minutes of Exercise per Session: Not on file  Stress:   . Feeling of Stress : Not on file  Social Connections:   . Frequency of Communication with Friends and Family: Not on file  . Frequency of Social Gatherings with Friends and Family: Not on file  . Attends Religious Services: Not on file  . Active Member of Clubs or Organizations: Not on file  . Attends Archivist Meetings: Not on file  . Marital Status: Not on file  Intimate Partner Violence:   . Fear of Current or Ex-Partner: Not on file  . Emotionally Abused: Not on file  . Physically Abused: Not on file  . Sexually Abused: Not on file    Family History  Problem Relation Age of Onset  . Lung cancer Maternal Aunt   . Breast cancer Maternal Aunt 57  . Other Mother        Killed in car accident, 55  . Other Father        Killed in car accident, 80  . Thyroid disease Brother   . Thyroid disease Sister       Review of systems complete and found to be negative unless listed above      PHYSICAL EXAM  General: Well developed, well nourished, in no acute distress HEENT:  Normocephalic and atramatic Neck:  No JVD.  Lungs: Clear bilaterally to auscultation and percussion. Heart: HRRR . Normal S1 and S2 without gallops or murmurs.  Abdomen: Bowel sounds are positive, abdomen soft and non-tender  Msk:  Back normal, normal gait. Normal strength and tone for age. Extremities: No clubbing, cyanosis or edema.   Neuro: Alert and oriented X 3. Psych:  Good affect, responds appropriately  Labs:   Lab Results  Component Value Date   WBC 5.4 11/03/2019   HGB 7.5 (L) 11/03/2019   HCT 24.2 (L) 11/03/2019   MCV 93.1 11/03/2019   PLT 325 11/03/2019    Recent Labs  Lab 11/03/19 1221  NA 135  K 4.9  CL 107  CO2 22  BUN 29*  CREATININE 1.17*  CALCIUM 9.1  PROT 7.4  BILITOT 0.5  ALKPHOS 75  ALT 15  AST 23  GLUCOSE 135*   Lab Results  Component Value Date   CKTOTAL 71 12/16/2014    CKMB 2.3 12/16/2014   TROPONINI <0.03 04/30/2016    Lab Results  Component Value Date   CHOL 266 (H) 01/06/2019   CHOL 245 (H) 06/29/2018   CHOL 265 (H) 12/28/2017   Lab Results  Component Value Date   HDL 43 01/06/2019   HDL 42 06/29/2018   HDL 47 12/28/2017   Lab Results  Component Value Date   LDLCALC 184 (H) 01/06/2019   LDLCALC 154 (H) 06/29/2018   LDLCALC 178 (H) 12/28/2017   Lab Results  Component Value Date   TRIG 194 (H) 01/06/2019   TRIG 246 (H) 06/29/2018  TRIG 198 (H) 12/28/2017   No results found for: CHOLHDL No results found for: LDLDIRECT    Radiology: MM 3D SCREEN BREAST BILATERAL  Result Date: 10/24/2019 CLINICAL DATA:  Screening. EXAM: DIGITAL SCREENING BILATERAL MAMMOGRAM WITH TOMO AND CAD COMPARISON:  Previous exam(s). ACR Breast Density Category b: There are scattered areas of fibroglandular density. FINDINGS: In the left breast, a possible asymmetry warrants further evaluation. In the right breast, no findings suspicious for malignancy. Images were processed with CAD. IMPRESSION: Further evaluation is suggested for possible asymmetry in the left breast. RECOMMENDATION: Diagnostic mammogram and possibly ultrasound of the left breast. (Code:[id number]) The patient will be contacted regarding the findings, and additional imaging will be scheduled. BI-RADS CATEGORY  0: Incomplete. Need additional imaging evaluation and/or prior mammograms for comparison. Electronically Signed   By: Audie Pinto M.D.   On: 10/24/2019 15:22    EKG:   ASSESSMENT AND PLAN:   1.  Coronary artery disease, status post coronary stent mid RCA 08/2017, status post drug-eluting stent distal RCA 07/31/2019, currently without chest pain, currently on low-dose aspirin and Effient 2.  GI bleed, with marked anemia, recent history of melena, with prior history of small bowel AVM, currently on aspirin and Effient. 3.  COPD  Recommendations  1.  Agree with overall current therapy 2.   Agree with transfusion 3.  Continue aspirin and Effient uninterrupted if possible.  If aspirin and/or Effient held, resume as soon as possible when bleeding stabilized.  If Effient held, antiplatelet effects will persist 5 to 7 days. 4.  Further recommendations pending patient's initial clinical course  Signed: Isaias Cowman MD,PhD, [facility] 11/03/2019, 2:21 PM

## 2019-11-03 NOTE — ED Notes (Signed)
This RN at bedside to [place] with rectal exam.

## 2019-11-03 NOTE — ED Notes (Signed)
Will send 2nd pink tube to lab as requested.

## 2019-11-03 NOTE — H&P (Signed)
History and Physical    Leslie Olsen [id number] DOB: 1951-07-05 DOA: 11/03/2019  Referring MD/NP/PA:   PCP: Efrain Sella, MD   Patient coming from:  The patient is coming from home.  At baseline, pt is independent for most of ADL.        Chief Complaint: dark stool  HPI: Leslie Olsen is a 69 y.o. female with medical history significant of hypertension, diabetes mellitus, COPD, GERD, hypothyroidism, CAD, stent placement (s/p (08/2017 and 07/2019), CKD-2, iron deficiency anemia, migraine headache, IBS, AVMs in small bowel, who presented with dark stool.   Pt states that she has been having intermittent dark tarry stool in the past 2 weeks.  She has lightheadedness and generalized weakness.  She has some nausea, no vomiting or diarrhea.  She has right lower quadrant abdominal bloating feeling.  Patient does not have chest pain, cough, fever or chills.  No symptoms of UTI or unilateral weakness. Patient was seen as an outpatient by Dr. Allen Norris yesterday where labs showed marked anemia with hemoglobin 7.8 (Hgb 11.4 on 08/01/19). Of note, pt is on daily aspirin and Effient due to recent history of stenting.  ED Course: pt was found to have hemoglobin 7.5, WBC 5.9, BNP 63, pending Covid PCR, stable renal function, temperature normal, blood pressure 138/75, tachycardia, oxygen saturation 97 to 100% on room air.  Patient is admitted to [place] bed as inpatient.  GI, Dr. Alice Reichert and Card, Dr. Saralyn Pilar were consulted.  Review of Systems:   General: no fevers, chills, no body weight gain, has fatigue HEENT: no blurry vision, hearing changes or sore throat Respiratory: no dyspnea, coughing, wheezing CV: no chest pain, no palpitations GI: no nausea, vomiting, has abdominal bloating, no diarrhea, constipation. Has dark tarry stool GU: no dysuria, burning on urination, increased urinary frequency, hematuria  Ext: no leg edema Neuro: no unilateral weakness, numbness, or tingling, no vision  change or hearing loss Skin: no rash, no skin tear. MSK: No muscle spasm, no deformity, no limitation of range of movement in spin Heme: No easy bruising.  Travel history: No recent long distant travel.  Allergy: No Known Allergies  Past Medical History:  Diagnosis Date  . Anemia   . Cephalalgia 04/08/2015  . Chronic headaches   . COPD (chronic obstructive pulmonary disease) ([place])   . Diabetes mellitus without complication ([place])   . Fatigue due to excessive exertion 08/22/2018  . GERD (gastroesophageal reflux disease)   . Hypertension   . Hypothyroidism   . RMSF [facility] spotted fever) 07/27/2016    Past Surgical History:  Procedure Laterality Date  . ABDOMINAL HYSTERECTOMY    . BACK SURGERY    . BREAST EXCISIONAL BIOPSY Left 1970's  . CHOLECYSTECTOMY    . CORONARY STENT INTERVENTION N/A 07/31/2019   Procedure: CORONARY STENT INTERVENTION;  Surgeon: Isaias Cowman, MD;  Location: [place] [facility];  Service: Cardiovascular;  Laterality: N/A;  RCA  . CORONARY STENT PLACEMENT    . ESOPHAGOGASTRODUODENOSCOPY (EGD) WITH PROPOFOL N/A 05/26/2016   Procedure: ESOPHAGOGASTRODUODENOSCOPY (EGD) WITH PROPOFOL;  Surgeon: Lucilla Lame, MD;  Location: [facility] ENDOSCOPY;  Service: Endoscopy;  Laterality: N/A;  . GIVENS CAPSULE STUDY N/A 05/05/2016   Procedure: GIVENS CAPSULE STUDY;  Surgeon: Lucilla Lame, MD;  Location: [facility] ENDOSCOPY;  Service: Endoscopy;  Laterality: N/A;  . LEFT HEART CATH AND CORONARY ANGIOGRAPHY Left 07/31/2019   Procedure: LEFT HEART CATH AND CORONARY ANGIOGRAPHY;  Surgeon: Isaias Cowman, MD;  Location: [place] [facility];  Service:  Cardiovascular;  Laterality: Left;  . REDUCTION MAMMAPLASTY    . REPLACEMENT TOTAL KNEE    . SHOULDER SURGERY    . TIBIA FRACTURE SURGERY      Social History:  reports that she quit smoking about 7 years ago. Her smoking use included cigarettes. She has a 90.00 pack-year smoking history. She has never used smokeless  tobacco. She reports that she does not drink alcohol or use drugs.  Family History:  Family History  Problem Relation Age of Onset  . Lung cancer Maternal Aunt   . Breast cancer Maternal Aunt 57  . Other Mother        Killed in car accident, 110  . Other Father        Killed in car accident, 82  . Thyroid disease Brother   . Thyroid disease Sister      Prior to Admission medications   Medication Sig Start Date End Date Taking? Authorizing Provider  aspirin 81 MG tablet Take 81 mg by mouth daily.    [provider]  clonazePAM (KLONOPIN) 0.5 MG tablet Take 0.5 mg by mouth at bedtime.  02/23/16   [provider]  ferrous gluconate (FERGON) 324 MG tablet Take 324 mg by mouth daily. 05/28/18   [provider]  glucose blood (ONE TOUCH ULTRA TEST) test strip USE ONE STRIP TO CHECK GLUCOSE TWICE DAILY 10/07/18   Johnson, Megan P, DO  levETIRAcetam (KEPPRA) 500 MG tablet Take 500 mg by mouth 2 (two) times daily.  10/03/17   [provider]  lisinopril (ZESTRIL) 5 MG tablet Take 1 tablet (5 mg total) by mouth daily. Patient taking differently: Take 10 mg by mouth daily.  01/06/19   Johnson, Megan P, DO  metFORMIN (GLUCOPHAGE) 500 MG tablet Take 1 tablet (500 mg total) by mouth daily with breakfast. Patient taking differently: Take 1,000 mg by mouth 2 (two) times daily with a meal.  01/06/19   Johnson, Megan P, DO  oxybutynin (DITROPAN XL) 15 MG 24 hr tablet Take 1 tablet (15 mg total) by mouth daily. Patient not taking: Reported on 11/02/2019 05/02/19   Billey Co, MD  pantoprazole (PROTONIX) 40 MG tablet Take 40 mg by mouth daily as needed (Heartburn).  05/28/18   [provider]  prasugrel (EFFIENT) 10 MG TABS tablet Take 1 tablet (10 mg total) by mouth daily. 08/01/19   Paraschos, Alexander, MD  SUMAtriptan (IMITREX) 100 MG tablet Take 1 tablet (100 mg total) by mouth every 2 (two) hours as needed for migraine. May repeat in 2 hours if headache persists  or recurs. 10/07/18   Valerie Roys, DO  SYNTHROID 100 MCG tablet Take 1 tablet (100 mcg total) by mouth daily before breakfast. 10/07/18   Valerie Roys, DO    Physical Exam: Vitals:   11/03/19 1430 11/03/19 1527 11/03/19 1556 11/03/19 1821  BP: 122/64 (!) 129/55 123/64 126/64  Pulse: 84 75 75 81  Resp:  18 18 20   Temp:  97.8 F (36.6 C) 97.7 F (36.5 C) 98.1 F (36.7 C)  TempSrc:  Oral Oral Oral  SpO2: 100% 100% 100% 100%  Weight:      Height:       General: Not in acute distress. Pale looking HEENT:       Eyes: PERRL, EOMI, no scleral icterus.       ENT: No discharge from the ears and nose, no pharynx injection, no tonsillar enlargement.        Neck:  No JVD, no bruit, no mass felt. Heme: No neck lymph node enlargement. Cardiac: S1/S2, RRR, No murmurs, No gallops or rubs. Respiratory: No rales, wheezing, rhonchi or rubs. GI: Soft, nondistended, nontender, no rebound pain, no organomegaly, BS present. GU: No hematuria Ext: No pitting leg edema bilaterally. 2+DP/PT pulse bilaterally. Musculoskeletal: No joint deformities, No joint redness or warmth, no limitation of ROM in spin. Skin: No rashes.  Neuro: Alert, oriented X3, cranial nerves II-XII grossly intact, moves all extremities normally.  Psych: Patient is not psychotic, no suicidal or hemocidal ideation.  Labs on Admission: I have personally reviewed following labs and imaging studies  CBC: Recent Labs  Lab 11/02/19 1008 11/03/19 1221  WBC 5.9 5.4  NEUTROABS 3.3 3.0  HGB 7.8* 7.5*  HCT 24.0* 24.2*  MCV 90 93.1  PLT 340 [id number]   Basic Metabolic Panel: Recent Labs  Lab 11/03/19 1221  NA 135  K 4.9  CL 107  CO2 22  GLUCOSE 135*  BUN 29*  CREATININE 1.17*  CALCIUM 9.1   GFR: Estimated Creatinine Clearance: 49.5 mL/min (A) (by C-G formula based on SCr of 1.17 mg/dL (H)). Liver Function Tests: Recent Labs  Lab 11/03/19 1221  AST 23  ALT 15  ALKPHOS 75  BILITOT 0.5  PROT 7.4  ALBUMIN 4.3   No  results for input(s): LIPASE, AMYLASE in the last 168 hours. No results for input(s): AMMONIA in the last 168 hours. Coagulation Profile: Recent Labs  Lab 11/03/19 1221  INR 0.9   Cardiac Enzymes: No results for input(s): CKTOTAL, CKMB, CKMBINDEX, TROPONINI in the last 168 hours. BNP (last 3 results) No results for input(s): PROBNP in the last 8760 hours. HbA1C: No results for input(s): HGBA1C in the last 72 hours. CBG: Recent Labs  Lab 11/03/19 1633  GLUCAP 91   Lipid Profile: No results for input(s): CHOL, HDL, LDLCALC, TRIG, CHOLHDL, LDLDIRECT in the last 72 hours. Thyroid Function Tests: Recent Labs    11/02/19 1008  TSH 1.640   Anemia Panel: Recent Labs    11/02/19 1008  FERRITIN 15  TIBC 353  IRON 17*   Urine analysis:    Component Value Date/Time   COLORURINE Yellow 12/16/2014 1303   APPEARANCEUR Clear 02/27/2019 0900   LABSPEC 1.025 12/16/2014 1303   PHURINE 5.0 12/16/2014 1303   GLUCOSEU Negative 02/27/2019 0900   GLUCOSEU >=500 12/16/2014 1303   HGBUR Negative 12/16/2014 1303   BILIRUBINUR Negative 02/27/2019 0900   BILIRUBINUR Negative 12/16/2014 1303   KETONESUR Trace 12/16/2014 1303   PROTEINUR Negative 02/27/2019 0900   PROTEINUR 30 mg/dL 12/16/2014 1303   NITRITE Negative 02/27/2019 0900   NITRITE Negative 12/16/2014 1303   LEUKOCYTESUR Negative 02/27/2019 0900   LEUKOCYTESUR Negative 12/16/2014 1303   Sepsis Labs: @LABRCNTIP (procalcitonin:4,lacticidven:4) )No results found for this or any previous visit (from the past 240 hour(s)).   Radiological Exams on Admission: No results found.   EKG: Independently reviewed.  Sinus rhythm, QTC 437, early R wave progression, low voltage, nonspecific T wave change  Assessment/Plan Principal Problem:   GIB (gastrointestinal bleeding) Active Problems:   COPD (chronic obstructive pulmonary disease) (HCC)   Iron deficiency anemia   Essential hypertension, benign   CAD (coronary artery disease)    Symptomatic anemia   Type II diabetes mellitus with renal manifestations (HCC)   Hypothyroidism   GIB (gastrointestinal bleeding): Hgb dropped from 11.4 to 7.5.  Currently hemodynamically stable. Dr. Alice Reichert of GI is consulted - will admit to med-surg bed as inpt -  transfuse 1 unitsof blood now - NPO  - IVF: 500 cc NS bolus, then at 75 mL/hr - Start IV pantoprazole gtt - Zofran IV for nausea - Avoid NSAIDs and SQ heparin - Maintain IV access (2 large bore IVs if possible). - Monitor closely and follow q6h cbc, transfuse as necessary, if Hgb<7.0 - La[facility]: INR, PTT and type screen  CAD (coronary artery disease): s/p of stent. Pt is on ASA and Effient. Card is consulted, per Dr. Saralyn Pilar, "Continue aspirin and Effient uninterrupted if possible.  If aspirin and/or Effient held, resume as soon as possible when bleeding stabilized.  If Effient held, antiplatelet effects will persist 5 to 7 days" -will hold ASA and Effient now given significant hemoglobin drop  COPD (chronic obstructive pulmonary disease) ([place]): stable -prn albuterol  HTN:  -Continue home medications: Lisinopril -hydralazine prn  Symptomatic anemia and iron deficiency anemia -Continue iron supplement -Transfuse 1 unit of blood as above  Type II diabetes mellitus with renal manifestations ([place]): Most recent A1c 5.9, well controled. Patient is taking Metformin at home -SSI  Hypothyroidism -Synthroid   Inpatient status:  # Patient requires inpatient status due to high intensity of service, high risk for further deterioration and high frequency of surveillance required.  I certify that at the point of admission it is my clinical judgment that the patient will require inpatient hospital care spanning beyond 2 midnights from the point of admission.  . This patient has multiple chronic comorbidities including hypertension, diabetes mellitus, COPD, GERD, hypothyroidism, CAD, stent placement (s/p (08/2017 and 07/2019), CKD-2,  iron deficiency anemia, migraine headache, IBS, AVMs in small bowel . Now patient has presenting with GIB . The worrisome physical exam findings include pale looking . The initial radiographic and laboratory data are worrisome because of Hgb drop from 11.4 to 7.5 . Current medical needs: please see my assessment and plan . Predictability of an adverse outcome (risk): Patient has multiple comorbidities as listed above. Now presents with GIB with Hgb drop from 11.4 to 7.5. Patient's presentation is highly complicated.  Patient is at high risk of deteriorating.  Will need to be treated in hospital for at least 2 days.        DVT ppx: SCD Code Status: Full code Family Communication: not done, no family member is at bed side.     Disposition Plan:  Anticipate discharge back to previous home environment Consults called:  GI, Dr. Alice Reichert and Card, Dr. Saralyn Pilar were consulted. Admission status: Med-surg bed as inpt         Date of Service 11/03/2019    [facility]   If 7PM-7AM, please contact night-coverage [email or web address] 11/03/2019, 6:44 PM

## 2019-11-03 NOTE — Consult Note (Signed)
GI Inpatient Consult Note  Reason for Consult: Melena   Attending Requesting Consult: Dr. Ivor Costa, MD  History of Present Illness: Leslie Olsen is a 69 y.o. female seen for evaluation of melena at the request of Dr. Blaine Hamper. Pt has a PMH of CAD s/p stent 08/2017 and 07/2019, HTN, HLD, COPD, diabetes c/b gastroparesis, Hx of SIBO, Hx of RMSF, and GERD who presented to the [facility] ED for marked anemia at request of her gastroenterologist - Dr. Allen Norris. Patient was seen as an outpatient by Dr. Allen Norris yesterday where labs showed marked anemia with hemoglobin 7.8, hematocrit 24, iron sat 5%, ferritin 15. There is a 4-gram drop in her hemoglobin from 3 months ago. She reports over the past two months she has been experiencing dark and tarry stools daily, sometimes 2-3 stools daily. She reports she has noticed progressive shortness of breath, decreased energy levels, and paleness. She had stent placed 07/2019 and is on aspirin and Effient. She denies any nausea, vomiting, dysphagia, odynophagia, abdominal pain, early satiety, or unintentional weight loss. She reports some chronic musculoskeletal pain located in her RUQ which is intermittent in nature and seems to be aggravated when she bends over or twists. Pain is not alleviated with passing of a BM. Upon presentation to the ED today,  hemoglobin 7.5, INR 0.9. She has had one BM today which was black and tarry. She denies any bright red blood per rectum. She reports a history of small bowel AVM s/p APC 05/2016 by Dr. Allen Norris and also had two cecal AVMs which were treated with APC 01/2017 at [facility]. She is concerned this could be causing her bleeding again. Son is present in room during examination and reports he has been concerned about his mother over the past few months. Patient is receiving 1 unit pRBCs this afternoon during time of my examination.    Last Colonoscopy: 01/2017  - The examined portion of the ileum was normal.   - Diverticulosis in the entire examined  colon  - Two non-bleeding colonic angiodysplastic lesions in cecum s/p APC - Four polyps in the descending/transverse colon - SSA   Last Endoscopy:   VCE 04/2016: Gastric erosion and healing ulcer, multiple small and medium sized AVMs   EGD 05/2016:  - Multiple bleeding angiodysplastic lesions in the duodenum. Treated with argon beam coagulation. - Multiple bleeding angiodysplastic lesions in the duodenum. Treated with argon beam coagulation. - Normal stomach. - Medium-sized hiatal hernia. - LA Grade A esophagitis. Biopsied.   Past Medical History:  Past Medical History:  Diagnosis Date  . Anemia   . Cephalalgia 04/08/2015  . Chronic headaches   . COPD (chronic obstructive pulmonary disease) ([place])   . Diabetes mellitus without complication ([place])   . Fatigue due to excessive exertion 08/22/2018  . GERD (gastroesophageal reflux disease)   . Hypertension   . Hypothyroidism   . RMSF [facility] spotted fever) 07/27/2016    Problem List: Patient Active Problem List   Diagnosis Date Noted  . GIB (gastrointestinal bleeding) 11/03/2019  . Type II diabetes mellitus with renal manifestations ([place]) 11/03/2019  . Symptomatic anemia   . Type 2 diabetes mellitus without complication, without long-term current use of insulin ([place])   . Hypothyroidism   . CAD (coronary artery disease) 07/31/2019  . 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 Coronary artery disease due to lipid rich plaque 08/22/2018  . Excessive daytime sleepiness 08/22/2018  . Type 2 diabetes mellitus with  diabetic autonomic neuropathy, without long-term current use of insulin ([place]) 06/29/2018  . DNR (do not resuscitate) 09/27/2017  . Gastroparesis 02/16/2017  . Adenoma of liver 02/16/2017  . Myoclonus 02/16/2017  . Malnutrition ([place]) 02/16/2017  . Adrenal adenoma 02/16/2017  . Angiodysplasia of stomach and duodenum with hemorrhage   . Esophagitis, unspecified   .  Dysphonia spastica 05/01/2016  . Thyroid activity decreased 02/21/2016  . Atherosclerosis of native coronary artery of native heart without angina pectoris 12/30/2015  . Essential hypertension, benign 12/12/2015  . Obesity 12/12/2015  . Iron deficiency anemia 10/22/2015  . Occipital neuralgia 10/11/2015  . Hyperlipidemia 07/11/2015  . Anemia 05/23/2014  . Allergic rhinitis 01/09/2014  . OSA on CPAP 01/24/2013  . Multiple pulmonary nodules 11/02/2012  . COPD (chronic obstructive pulmonary disease) ([place]) 11/01/2012    Past Surgical History: Past Surgical History:  Procedure Laterality Date  . ABDOMINAL HYSTERECTOMY    . BACK SURGERY    . BREAST EXCISIONAL BIOPSY Left 1970's  . CHOLECYSTECTOMY    . CORONARY STENT INTERVENTION N/A 07/31/2019   Procedure: CORONARY STENT INTERVENTION;  Surgeon: Isaias Cowman, MD;  Location: [place] [facility];  Service: Cardiovascular;  Laterality: N/A;  RCA  . CORONARY STENT PLACEMENT    . ESOPHAGOGASTRODUODENOSCOPY (EGD) WITH PROPOFOL N/A 05/26/2016   Procedure: ESOPHAGOGASTRODUODENOSCOPY (EGD) WITH PROPOFOL;  Surgeon: Lucilla Lame, MD;  Location: [facility] ENDOSCOPY;  Service: Endoscopy;  Laterality: N/A;  . GIVENS CAPSULE STUDY N/A 05/05/2016   Procedure: GIVENS CAPSULE STUDY;  Surgeon: Lucilla Lame, MD;  Location: [facility] ENDOSCOPY;  Service: Endoscopy;  Laterality: N/A;  . LEFT HEART CATH AND CORONARY ANGIOGRAPHY Left 07/31/2019   Procedure: LEFT HEART CATH AND CORONARY ANGIOGRAPHY;  Surgeon: Isaias Cowman, MD;  Location: [place] [facility];  Service: Cardiovascular;  Laterality: Left;  . REDUCTION MAMMAPLASTY    . REPLACEMENT TOTAL KNEE    . SHOULDER SURGERY    . TIBIA FRACTURE SURGERY      Allergies: No Known Allergies  Home Medications: Medications Prior to Admission  Medication Sig Dispense Refill Last Dose  . aspirin 81 MG tablet Take 81 mg by mouth daily.   11/03/2019 at 0800  . cholecalciferol (VITAMIN D3) 25 MCG (1000 UNIT)  tablet Take 2,000 Units by mouth daily.   11/03/2019 at 0800  . clonazePAM (KLONOPIN) 0.5 MG tablet Take 0.5 mg by mouth at bedtime.    11/02/2019 at 2000  . ferrous gluconate (FERGON) 324 MG tablet Take 324 mg by mouth daily.  11 11/03/2019 at 0800  . levETIRAcetam (KEPPRA) 500 MG tablet Take 500 mg by mouth daily.    11/02/2019 at 2000  . lisinopril (ZESTRIL) 5 MG tablet Take 1 tablet (5 mg total) by mouth daily. (Patient taking differently: Take 10 mg by mouth daily. ) 90 tablet 1 11/03/2019 at 0800  . metFORMIN (GLUCOPHAGE) 500 MG tablet Take 1 tablet (500 mg total) by mouth daily with breakfast. (Patient taking differently: Take 1,000 mg by mouth 2 (two) times daily with a meal. ) 90 tablet 1 11/03/2019 at 0800  . pantoprazole (PROTONIX) 40 MG tablet Take 40 mg by mouth daily as needed (Heartburn).   3 prn at prn  . prasugrel (EFFIENT) 10 MG TABS tablet Take 1 tablet (10 mg total) by mouth daily. 30 tablet 5 11/03/2019 at 0800  . SUMAtriptan (IMITREX) 100 MG tablet Take 1 tablet (100 mg total) by mouth every 2 (two) hours as needed for migraine. May repeat in 2 hours if headache persists  or recurs. 10 tablet 12 prn at prn  . SYNTHROID 100 MCG tablet Take 1 tablet (100 mcg total) by mouth daily before breakfast. 90 tablet 3 11/03/2019 at 0800  . glucose blood (ONE TOUCH ULTRA TEST) test strip USE ONE STRIP TO CHECK GLUCOSE TWICE DAILY 100 each 12   . oxybutynin (DITROPAN XL) 15 MG 24 hr tablet Take 1 tablet (15 mg total) by mouth daily. (Patient not taking: Reported on 11/02/2019) 30 tablet 11 Not Taking at Unknown time   Home medication reconciliation was completed with the patient.   Scheduled Inpatient Medications:   . [START ON 11/04/2019] cholecalciferol  2,000 Units Oral Daily  . clonazePAM  0.5 mg Oral QHS  . [START ON 11/04/2019] ferrous gluconate  324 mg Oral Daily  . insulin aspart  0-5 Units Subcutaneous QHS  . insulin aspart  0-9 Units Subcutaneous TID WC  . levETIRAcetam  500 mg Oral  Daily  . [START ON 11/04/2019] levothyroxine  100 mcg Oral Q0600  . [START ON 11/04/2019] lisinopril  10 mg Oral Daily  . [START ON 11/07/2019] pantoprazole  40 mg Intravenous Q12H    Continuous Inpatient Infusions:   . sodium chloride    . pantoprozole (PROTONIX) infusion 8 mg/hr (11/03/19 1424)    PRN Inpatient Medications:  acetaminophen, hydrALAZINE, ondansetron (ZOFRAN) IV, SUMAtriptan  Family History: family history includes Breast cancer (age of onset: 57) in her maternal aunt; Lung cancer in her maternal aunt; Other in her father and mother; Thyroid disease in her brother and sister.  The patient's family history is negative for inflammatory bowel disorders, GI malignancy, or solid organ transplantation.  Social History:   reports that she quit smoking about 7 years ago. Her smoking use included cigarettes. She has a 90.00 pack-year smoking history. She has never used smokeless tobacco. She reports that she does not drink alcohol or use drugs. The patient denies ETOH, tobacco, or drug use.   Review of Systems: Constitutional: Weight is stable.  Eyes: No changes in vision. ENT: No oral lesions, sore throat.  GI: see HPI.  Heme/Lymph: No easy bruising.  CV: No chest pain.  GU: No hematuria.  Integumentary: No rashes.  Neuro: No headaches.  Psych: No depression/anxiety.  Endocrine: No heat/cold intolerance.  Allergic/Immunologic: No urticaria.  Resp: No cough, SOB.  Musculoskeletal: No joint swelling.    Physical Examination: BP 123/64   Pulse 75   Temp 97.7 F (36.5 C) (Oral)   Resp 18   Ht 5\' 6"  (1.676 m)   Wt 86.3 kg   SpO2 100%   BMI 30.71 kg/m  Gen: NAD, alert and oriented x 4 HEENT: PEERLA, EOMI, Neck: supple, no JVD or thyromegaly Chest: CTA bilaterally, no wheezes, crackles, or other adventitious sounds CV: RRR, no m/g/c/r Abd: soft, NT, ND, +BS in all four quadrants; no HSM, guarding, ridigity, or rebound tenderness Ext: no edema, well perfused with 2+  pulses, Skin: no rash or lesions noted Lymph: no LAD  Data: Lab Results  Component Value Date   WBC 5.4 11/03/2019   HGB 7.5 (L) 11/03/2019   HCT 24.2 (L) 11/03/2019   MCV 93.1 11/03/2019   PLT 325 11/03/2019   Recent Labs  Lab 11/02/19 1008 11/03/19 1221  HGB 7.8* 7.5*   Lab Results  Component Value Date   NA 135 11/03/2019   K 4.9 11/03/2019   CL 107 11/03/2019   CO2 22 11/03/2019   BUN 29 (H) 11/03/2019   CREATININE 1.17 (H) 11/03/2019  GLU 103 06/02/2017   Lab Results  Component Value Date   ALT 15 11/03/2019   AST 23 11/03/2019   ALKPHOS 75 11/03/2019   BILITOT 0.5 11/03/2019   Recent Labs  Lab 11/03/19 1221  INR 0.9   Assessment/Plan:  69 y/o Caucasian female with a PMH of CAD s/p stent 07/2019, HTN, HLD, COPD, diabetes c/b gastroparesis, Hx of SIBO, Hx of RMSF, and GERD admitted for symptomatic anemia and melena  1. Symptomatic anemia 2. Melena 3. Recent cardiac stenting 07/2019 on ASA and Effient 4. Hx of duodenal AVM s/p APC 5. Hx of cecal AVM s/p APC  -There is a 4-gram drop in hemoglobin over the past three months and findings of symptomatic anemia likely related to melena over the past two months -Differential includes small bowel/right colon AVMs, esophagitis, gastritis, PUD, duodenitis, polyp, malignancy -Agee with serial monitoring of H&H. Transfuse for Hgb <7 -Transfuse as necessary to ensure hgb >7 -Agree with acid suppression -Cardiology input is appreciated -Clear liquid diet -Cardiology following and appreciate input -Hold antiplatelet therapy in setting of GI bleed -Anticipate EGD and colonoscopy on Sunday with Dr. Alice Reichert -Following along with you    Thank you for the consult. Please call with questions or concerns.  Reeves Forth [facility] [phone] (Cell)

## 2019-11-03 NOTE — ED Notes (Signed)
Pt reports black tarry stools for weeks. Denies bright red blood. Reports weakness and feeling cold. History of iron deficiency, stent placement, and "AVM" per pt.

## 2019-11-04 DIAGNOSIS — E039 Hypothyroidism, unspecified: Secondary | ICD-10-CM

## 2019-11-04 DIAGNOSIS — I10 Essential (primary) hypertension: Secondary | ICD-10-CM

## 2019-11-04 DIAGNOSIS — I2510 Atherosclerotic heart disease of native coronary artery without angina pectoris: Secondary | ICD-10-CM

## 2019-11-04 LAB — CBC
HCT: 26.6 % — ABNORMAL LOW (ref 36.0–46.0)
HCT: 24.9 % — ABNORMAL LOW (ref 36.0–46.0)
Hemoglobin: 8.3 g/dL — ABNORMAL LOW (ref 12.0–15.0)
Hemoglobin: 8 g/dL — ABNORMAL LOW (ref 12.0–15.0)
MCH: 28.3 pg (ref 26.0–34.0)
MCH: 29 pg (ref 26.0–34.0)
MCHC: 31.2 g/dL (ref 30.0–36.0)
MCHC: 32.1 g/dL (ref 30.0–36.0)
MCV: 90.8 fL (ref 80.0–100.0)
MCV: 90.2 fL (ref 80.0–100.0)
Platelets: 281 10*3/uL (ref 150–400)
Platelets: 278 10*3/uL (ref 150–400)
RBC: 2.93 MIL/uL — ABNORMAL LOW (ref 3.87–5.11)
RBC: 2.76 MIL/uL — ABNORMAL LOW (ref 3.87–5.11)
RDW: 13.8 % (ref 11.5–15.5)
RDW: 14 % (ref 11.5–15.5)
WBC: 5.6 10*3/uL (ref 4.0–10.5)
WBC: 5 10*3/uL (ref 4.0–10.5)
nRBC: 0 % (ref 0.0–0.2)
nRBC: 0 % (ref 0.0–0.2)

## 2019-11-04 LAB — BASIC METABOLIC PANEL WITH GFR
Anion gap: 7 (ref 5–15)
BUN: 20 mg/dL (ref 8–23)
CO2: 20 mmol/L — ABNORMAL LOW (ref 22–32)
Calcium: 8.7 mg/dL — ABNORMAL LOW (ref 8.9–10.3)
Chloride: 110 mmol/L (ref 98–111)
Creatinine, Ser: 1.07 mg/dL — ABNORMAL HIGH (ref 0.44–1.00)
GFR calc Af Amer: >60 mL/min
GFR calc non Af Amer: 53 mL/min — ABNORMAL LOW
Glucose, Bld: 109 mg/dL — ABNORMAL HIGH (ref 70–99)
Potassium: 4.5 mmol/L (ref 3.5–5.1)
Sodium: 137 mmol/L (ref 135–145)

## 2019-11-04 LAB — GLUCOSE, CAPILLARY
Glucose-Capillary: 113 mg/dL — ABNORMAL HIGH (ref 70–99)
Glucose-Capillary: 122 mg/dL — ABNORMAL HIGH (ref 70–99)
Glucose-Capillary: 76 mg/dL (ref 70–99)
Glucose-Capillary: 109 mg/dL — ABNORMAL HIGH (ref 70–99)

## 2019-11-04 LAB — HAPTOGLOBIN: Haptoglobin: 129 mg/dL (ref 37–355)

## 2019-11-04 LAB — HIV ANTIBODY (ROUTINE TESTING W REFLEX): HIV Screen 4th Generation wRfx: NONREACTIVE

## 2019-11-04 MED ORDER — POLYETHYLENE GLYCOL 3350 17 GM/SCOOP PO POWD
1.0000 | Freq: Once | ORAL | Status: AC
  Administered 2019-11-04: 255 g via ORAL
  Filled 2019-11-04: qty 255

## 2019-11-04 MED ORDER — SODIUM CHLORIDE 0.9% FLUSH
10.0000 mL | INTRAVENOUS | Status: DC | PRN
  Administered 2019-11-05: 10 mL

## 2019-11-04 MED ORDER — SODIUM CHLORIDE 0.9% FLUSH
10.0000 mL | Freq: Two times a day (BID) | INTRAVENOUS | Status: DC
  Administered 2019-11-04 – 2019-11-06 (×3): 10 mL

## 2019-11-04 NOTE — Progress Notes (Signed)
[facility] Cardiology  SUBJECTIVE: Patient laying in bed, denies chest pain or shortness of breath, feels better following transfusion   Vitals:   11/03/19 1821 11/03/19 1935 11/04/19 0343 11/04/19 0900  BP: 126/64 123/66 92/71 118/63  Pulse: 81 86 68 77  Resp: 20 16 16 16   Temp: 98.1 F (36.7 C) 98.1 F (36.7 C) 97.7 F (36.5 C)   TempSrc: Oral Oral Oral   SpO2: 100% 100% 99%   Weight:      Height:         Intake/Output Summary (Last 24 hours) at 11/04/2019 1017 Last data filed at 11/04/2019 0900 Gross per 24 hour  Intake 2108.35 ml  Output 2100 ml  Net 8.35 ml      PHYSICAL EXAM  General: Well developed, well nourished, in no acute distress HEENT:  Normocephalic and atramatic Neck:  No JVD.  Lungs: Clear bilaterally to auscultation and percussion. Heart: HRRR . Normal S1 and S2 without gallops or murmurs.  Abdomen: Bowel sounds are positive, abdomen soft and non-tender  Msk:  Back normal, normal gait. Normal strength and tone for age. Extremities: No clubbing, cyanosis or edema.   Neuro: Alert and oriented X 3. Psych:  Good affect, responds appropriately   LABS: Basic Metabolic Panel: Recent Labs    11/03/19 1221 11/04/19 0546  NA 135 137  K 4.9 4.5  CL 107 110  CO2 22 20*  GLUCOSE 135* 109*  BUN 29* 20  CREATININE 1.17* 1.07*  CALCIUM 9.1 8.7*   Liver Function Tests: Recent Labs    11/03/19 1221  AST 23  ALT 15  ALKPHOS 75  BILITOT 0.5  PROT 7.4  ALBUMIN 4.3   No results for input(s): LIPASE, AMYLASE in the last 72 hours. CBC: Recent Labs    11/02/19 1008 11/03/19 1221 11/03/19 1831 11/04/19 0106 11/04/19 0532  WBC 5.9 5.4   < > 5.6 5.0  NEUTROABS 3.3 3.0  --   --   --   HGB 7.8* 7.5*   < > 8.3* 8.0*  HCT 24.0* 24.2*   < > 26.6* 24.9*  MCV 90 93.1   < > 90.8 90.2  PLT 340 325   < > 281 278   < > = values in this interval not displayed.   Cardiac Enzymes: No results for input(s): CKTOTAL, CKMB, CKMBINDEX, TROPONINI in the last 72  hours. BNP: Invalid input(s): POCBNP D-Dimer: No results for input(s): DDIMER in the last 72 hours. Hemoglobin A1C: No results for input(s): HGBA1C in the last 72 hours. Fasting Lipid Panel: No results for input(s): CHOL, HDL, LDLCALC, TRIG, CHOLHDL, LDLDIRECT in the last 72 hours. Thyroid Function Tests: Recent Labs    11/02/19 1008  TSH 1.640   Anemia Panel: Recent Labs    11/02/19 1008  FERRITIN 15  TIBC 353  IRON 17*    No results found.   Echo   TELEMETRY: Sinus rhythm:  ASSESSMENT AND PLAN:  Principal Problem:   GIB (gastrointestinal bleeding) Active Problems:   COPD (chronic obstructive pulmonary disease) (HCC)   Iron deficiency anemia   Essential hypertension, benign   CAD (coronary artery disease)   Symptomatic anemia   Type II diabetes mellitus with renal manifestations (HCC)   Hypothyroidism    1.  Coronary artery disease, status post stent mid RCA 08/2017, status post drug-eluting stent distal RCA 07/31/2019, on low-dose aspirin and Effient, being held due to GI bleed, currently without chest pain or shortness of breath 2.  GI bleed,  with marked anemia, with recent history of melena, prior history of small bowel AVM, on aspirin and Effient, being held, EGD and colonoscopy planned for 11/05/2019 per Dr. Alice Reichert 3.  COPD, stable  Recommendations  1.  Agree with overall current therapy 2.  Resume aspirin and Effient as soon as possible following EGD and colonoscopy 3.  Defer further cardiac diagnostics at this time   Isaias Cowman, MD, PhD, [facility] 11/04/2019 10:17 AM

## 2019-11-04 NOTE — Progress Notes (Signed)
PROGRESS NOTE    Leslie Olsen  [id number] DOB: Aug 03, 1951 DOA: 11/03/2019 PCP: Efrain Sella, MD   Brief Narrative:  Leslie Olsen is a 69 y.o. with PMH of CAD s/p stent 08/2017 and 07/2019, HTN, HLD, COPD, diabetes c/b gastroparesis, Hx of SIBO, Hx of RMSF, and GERD who presented to the [facility] ED for marked anemia at request of her gastroenterologist - Dr. Allen Norris. Patient was seen as an outpatient by Dr. Allen Norris yesterday where labs showed marked anemia with hemoglobin 7.8, hematocrit 24, iron sat 5%, ferritin 15. There is a 4-gram drop in her hemoglobin from 3 months ago. She reports over the past two months she has been experiencing dark and tarry stools daily, sometimes 2-3 stools daily.  Also experiencing exertional dyspnea and decreased energy level.  Admitted for symptomatic anemia secondary to GI bleed.  Patient was on daily aspirin and Effient due to recent stent placement.  Subjective: Patient was having some epigastric discomfort when seen this morning.  Denies any more hematemesis.  No bowel movement since in hospital.  Assessment & Plan:   Principal Problem:   GIB (gastrointestinal bleeding) Active Problems:   COPD (chronic obstructive pulmonary disease) (HCC)   Iron deficiency anemia   Essential hypertension, benign   CAD (coronary artery disease)   Symptomatic anemia   Type II diabetes mellitus with renal manifestations (HCC)   Hypothyroidism  GIB (gastrointestinal bleeding):  Hemoglobin dropped to 7.5 from 11.4 in 3 months on admission.  Improved to 8.6 after 1 unit, it was 8 today.  No obvious bleeding since in hospital.  And has an history of bleeding AVMs in the past, now on dual antiplatelets for stent placement. GI was consulted-appreciate their recommendations. Patient will go for EGD and colonoscopy tomorrow. -Continue clear liquid today. -Continue to monitor. -Transfuse if hemoglobin drops below 7.  CAD (coronary artery disease): s/p of stent. Pt is on  ASA and Effient. Card is consulted, per Dr. Saralyn Pilar, "Continue aspirin and Effient uninterrupted if possible. If aspirin and/or Effient held, resume as soon as possible whenbleeding stabilized. If Effient held, antiplatelet effects will persist 5 to 7 days" -will hold ASA and Effient now given significant hemoglobin drop and for procedures tomorrow.  Can be restarted if cleared from GI standpoint.  COPD (chronic obstructive pulmonary disease) ([place]): stable -prn albuterol  HTN:  -Continue home medications: Lisinopril -hydralazine prn  Symptomatic anemia and iron deficiency anemia -Continue iron supplement -Transfuse 1 unit of blood as above  Type II diabetes mellitus with renal manifestations ([place]): Most recent A1c 5.9, well controled. Patient is taking Metformin at home -SSI  Hypothyroidism -Synthroid   Objective: Vitals:   11/03/19 1556 11/03/19 1821 11/03/19 1935 11/04/19 0343  BP: 123/64 126/64 123/66 92/71  Pulse: 75 81 86 68  Resp: 18 20 16 16   Temp: 97.7 F (36.5 C) 98.1 F (36.7 C) 98.1 F (36.7 C) 97.7 F (36.5 C)  TempSrc: Oral Oral Oral Oral  SpO2: 100% 100% 100% 99%  Weight:      Height:        Intake/Output Summary (Last 24 hours) at 11/04/2019 0915 Last data filed at 11/04/2019 0656 Gross per 24 hour  Intake 1868.35 ml  Output 2100 ml  Net -231.65 ml   Filed Weights   11/03/19 1125  Weight: 86.3 kg    Examination:  General exam: Appears calm and comfortable  Respiratory system: Clear to auscultation. Respiratory effort normal. Cardiovascular system: S1 & S2 heard, RRR. No JVD,  murmurs, rubs, gallops or clicks. Gastrointestinal system: Soft, gastric tenderness, nondistended, bowel sounds positive. Central nervous system: Alert and oriented. No focal neurological deficits.Symmetric 5 x 5 power. Extremities: No edema, no cyanosis, pulses intact and symmetrical. Skin: No rashes, lesions or ulcers Psychiatry: Judgement and insight appear  normal. Mood & affect appropriate.    DVT prophylaxis: SCDs Code Status: Full Family Communication: No family at bedside. Disposition Plan: Pending improvement and results of EGD and colonoscopy.  Most likely will go back home.  Consultants:   GI  Procedures:  Antimicrobials:   Data Reviewed: I have personally reviewed following labs and imaging studies  CBC: Recent Labs  Lab 11/02/19 1008 11/03/19 1221 11/03/19 1831 11/04/19 0106 11/04/19 0532  WBC 5.9 5.4 5.9 5.6 5.0  NEUTROABS 3.3 3.0  --   --   --   HGB 7.8* 7.5* 8.6* 8.3* 8.0*  HCT 24.0* 24.2* 27.8* 26.6* 24.9*  MCV 90 93.1 90.8 90.8 90.2  PLT 340 325 311 281 0000000   Basic Metabolic Panel: Recent Labs  Lab 11/03/19 1221 11/04/19 0546  NA 135 137  K 4.9 4.5  CL 107 110  CO2 22 20*  GLUCOSE 135* 109*  BUN 29* 20  CREATININE 1.17* 1.07*  CALCIUM 9.1 8.7*   GFR: Estimated Creatinine Clearance: 54.1 mL/min (A) (by C-G formula based on SCr of 1.07 mg/dL (H)). Liver Function Tests: Recent Labs  Lab 11/03/19 1221  AST 23  ALT 15  ALKPHOS 75  BILITOT 0.5  PROT 7.4  ALBUMIN 4.3   No results for input(s): LIPASE, AMYLASE in the last 168 hours. No results for input(s): AMMONIA in the last 168 hours. Coagulation Profile: Recent Labs  Lab 11/03/19 1221  INR 0.9   Cardiac Enzymes: No results for input(s): CKTOTAL, CKMB, CKMBINDEX, TROPONINI in the last 168 hours. BNP (last 3 results) No results for input(s): PROBNP in the last 8760 hours. HbA1C: No results for input(s): HGBA1C in the last 72 hours. CBG: Recent Labs  Lab 11/03/19 1633 11/03/19 2118 11/04/19 0804  GLUCAP 91 88 113*   Lipid Profile: No results for input(s): CHOL, HDL, LDLCALC, TRIG, CHOLHDL, LDLDIRECT in the last 72 hours. Thyroid Function Tests: Recent Labs    11/02/19 1008  TSH 1.640   Anemia Panel: Recent Labs    11/02/19 1008  FERRITIN 15  TIBC 353  IRON 17*   Sepsis Labs: No results for input(s): PROCALCITON,  LATICACIDVEN in the last 168 hours.  Recent Results (from the past 240 hour(s))  SARS CORONAVIRUS 2 (TAT 6-24 HRS) Nasopharyngeal Nasopharyngeal Swab     Status: None   Collection Time: 11/03/19 12:21 PM   Specimen: Nasopharyngeal Swab  Result Value Ref Range Status   SARS Coronavirus 2 NEGATIVE NEGATIVE Final    Comment: (NOTE) SARS-CoV-2 target nucleic acids are NOT DETECTED. The SARS-CoV-2 RNA is generally detectable in upper and lower respiratory specimens during the acute phase of infection. Negative results do not preclude SARS-CoV-2 infection, do not rule out co-infections with other pathogens, and should not be used as the sole basis for treatment or other patient management decisions. Negative results must be combined with clinical observations, patient history, and epidemiological information. The expected result is Negative. Fact Sheet for Patients: SugarRoll.be Fact Sheet for Healthcare Providers: https://www.woods-mathews.com/ This test is not yet approved or cleared by the [place] FDA and  has been authorized for detection and/or diagnosis of SARS-CoV-2 by FDA under an Emergency Use Authorization (EUA). This EUA will remain  in effect (meaning this test can be used) for the duration of the COVID-19 declaration under Section 56 4(b)(1) of the Act, 21 U.S.C. section 360bbb-3(b)(1), unless the authorization is terminated or revoked sooner. Performed at [facility], [place] [address], [place], [place] [address]      Radiology Studies: No results found.  Scheduled Meds: . cholecalciferol  2,000 Units Oral Daily  . clonazePAM  0.5 mg Oral QHS  . ferrous gluconate  324 mg Oral Daily  . insulin aspart  0-5 Units Subcutaneous QHS  . insulin aspart  0-9 Units Subcutaneous TID WC  . levETIRAcetam  500 mg Oral QHS  . levothyroxine  100 mcg Oral Q0600  . lisinopril  10 mg Oral Daily  . [START ON 11/07/2019] pantoprazole   40 mg Intravenous Q12H   Continuous Infusions: . sodium chloride 75 mL/hr at 11/04/19 0539  . pantoprozole (PROTONIX) infusion 8 mg/hr (11/04/19 0025)     LOS: 1 day   Time spent: 40 minutes.  Lorella Nimrod, MD [facility]  If 7PM-7AM, please contact night-coverage [email or web address]  11/04/2019, 9:15 AM   This record has been created using Systems analyst. Errors have been sought and corrected,but may not always be located. Such creation errors do not reflect on the standard of care.

## 2019-11-04 NOTE — Progress Notes (Signed)
GI Inpatient Follow-up Note  Subjective:  Patient seen in follow-up for melena. No acute events overnight. She reports no recurrent episodes of melena. She received 1 unit of pRBCs and hemoglobin this morning 8.0. She reports she still feels weak. She denies dizziness, chest pain, shortness of breath, or confusion. She denies nausea, vomiting, abdominal pain. No BMs since yesterday. She had clear liquids this morning and tolerated without any difficulty.   Scheduled Inpatient Medications:  . cholecalciferol  2,000 Units Oral Daily  . clonazePAM  0.5 mg Oral QHS  . ferrous gluconate  324 mg Oral Daily  . insulin aspart  0-5 Units Subcutaneous QHS  . insulin aspart  0-9 Units Subcutaneous TID WC  . levETIRAcetam  500 mg Oral QHS  . levothyroxine  100 mcg Oral Q0600  . lisinopril  10 mg Oral Daily  . [START ON 11/07/2019] pantoprazole  40 mg Intravenous Q12H    Continuous Inpatient Infusions:   . sodium chloride 75 mL/hr at 11/04/19 0539  . pantoprozole (PROTONIX) infusion 8 mg/hr (11/04/19 0025)    PRN Inpatient Medications:  acetaminophen, albuterol, hydrALAZINE, ondansetron (ZOFRAN) IV, SUMAtriptan  Review of Systems: Constitutional: Weight is stable.  Eyes: No changes in vision. ENT: No oral lesions, sore throat.  GI: see HPI.  Heme/Lymph: No easy bruising.  CV: No chest pain.  GU: No hematuria.  Integumentary: No rashes.  Neuro: No headaches.  Psych: No depression/anxiety.  Endocrine: No heat/cold intolerance.  Allergic/Immunologic: No urticaria.  Resp: No cough, SOB.  Musculoskeletal: No joint swelling.    Physical Examination: BP 92/71 (BP Location: Left Arm)   Pulse 68   Temp 97.7 F (36.5 C) (Oral)   Resp 16   Ht 5\' 6"  (1.676 m)   Wt 86.3 kg   SpO2 99%   BMI 30.71 kg/m  Gen: NAD, alert and oriented x 4 HEENT: PEERLA, EOMI, Neck: supple, no JVD or thyromegaly Chest: CTA bilaterally, no wheezes, crackles, or other adventitious sounds CV: RRR, no  m/g/c/r Abd: soft, NT, ND, +BS in all four quadrants; no HSM, guarding, ridigity, or rebound tenderness Ext: no edema, well perfused with 2+ pulses, Skin: no rash or lesions noted Lymph: no LAD  Data: Lab Results  Component Value Date   WBC 5.0 11/04/2019   HGB 8.0 (L) 11/04/2019   HCT 24.9 (L) 11/04/2019   MCV 90.2 11/04/2019   PLT 278 11/04/2019   Recent Labs  Lab 11/03/19 1831 11/04/19 0106 11/04/19 0532  HGB 8.6* 8.3* 8.0*   Lab Results  Component Value Date   NA 137 11/04/2019   K 4.5 11/04/2019   CL 110 11/04/2019   CO2 20 (L) 11/04/2019   BUN 20 11/04/2019   CREATININE 1.07 (H) 11/04/2019   GLU 103 06/02/2017   Lab Results  Component Value Date   ALT 15 11/03/2019   AST 23 11/03/2019   ALKPHOS 75 11/03/2019   BILITOT 0.5 11/03/2019   Recent Labs  Lab 11/03/19 1221  INR 0.9   Assessment/Plan:  69 y/o Caucasian female with a PMH of CAD s/p stent 07/2019, HTN, HLD, COPD, diabetes c/b gastroparesis, Hx of SIBO, Hx of RMSF, and GERD admitted for symptomatic anemia 2/2 melena x 2 months  1. Symptomatic anemia 2. Melena 3. Recent cardiac stenting 07/2019 on ASA and Effient 4. Hx of duodenal AVM s/p APC 5. Hx of cecal AVM s/p APC  - No ongoing overt gastrointestinal blood loss - She is s/p 1 unit pRBCs yesterday. Hemoglobin 8.0 this  AM. - Continue to closely monitor H&H and transfuse for Hgb <7.0 - Continue acid suppression - Continue to hold antiplatelets - Plan for EGD and colonoscopy tomorrow with Dr. Alice Reichert - Clear liquids today. NPO after midnight. - See procedure notes for findings and further recommendations  I reviewed the risks (including bleeding, perforation, infection, anesthesia complications, cardiac/respiratory complications), benefits and alternatives of EGD and colonoscopy. Patient consents to proceed.     Please call with questions or concerns.    Octavia Bruckner, PA-C [facility] [phone]  (Cell)

## 2019-11-05 ENCOUNTER — Inpatient Hospital Stay: Payer: PPO | Admitting: Anesthesiology

## 2019-11-05 ENCOUNTER — Encounter: Payer: Self-pay | Admitting: Internal Medicine

## 2019-11-05 ENCOUNTER — Encounter
Admission: EM | Disposition: A | Discharge status: Home / Self Care | Payer: Self-pay | Source: Home / Self Care | Attending: Internal Medicine

## 2019-11-05 DIAGNOSIS — K219 Gastro-esophageal reflux disease without esophagitis: Secondary | ICD-10-CM | POA: Diagnosis not present

## 2019-11-05 DIAGNOSIS — I2510 Atherosclerotic heart disease of native coronary artery without angina pectoris: Secondary | ICD-10-CM | POA: Diagnosis not present

## 2019-11-05 DIAGNOSIS — K31819 Angiodysplasia of stomach and duodenum without bleeding: Secondary | ICD-10-CM | POA: Diagnosis not present

## 2019-11-05 DIAGNOSIS — Q2733 Arteriovenous malformation of digestive system vessel: Secondary | ICD-10-CM | POA: Diagnosis not present

## 2019-11-05 DIAGNOSIS — J449 Chronic obstructive pulmonary disease, unspecified: Secondary | ICD-10-CM | POA: Diagnosis not present

## 2019-11-05 DIAGNOSIS — K6389 Other specified diseases of intestine: Secondary | ICD-10-CM | POA: Diagnosis not present

## 2019-11-05 DIAGNOSIS — F418 Other specified anxiety disorders: Secondary | ICD-10-CM | POA: Diagnosis not present

## 2019-11-05 DIAGNOSIS — I10 Essential (primary) hypertension: Secondary | ICD-10-CM | POA: Diagnosis not present

## 2019-11-05 DIAGNOSIS — E785 Hyperlipidemia, unspecified: Secondary | ICD-10-CM | POA: Diagnosis not present

## 2019-11-05 DIAGNOSIS — K649 Unspecified hemorrhoids: Secondary | ICD-10-CM | POA: Diagnosis not present

## 2019-11-05 DIAGNOSIS — K222 Esophageal obstruction: Secondary | ICD-10-CM | POA: Diagnosis not present

## 2019-11-05 DIAGNOSIS — K449 Diaphragmatic hernia without obstruction or gangrene: Secondary | ICD-10-CM | POA: Diagnosis not present

## 2019-11-05 HISTORY — PX: COLONOSCOPY: SHX5424

## 2019-11-05 HISTORY — PX: ESOPHAGOGASTRODUODENOSCOPY: SHX5428

## 2019-11-05 LAB — GLUCOSE, CAPILLARY
Glucose-Capillary: 125 mg/dL — ABNORMAL HIGH (ref 70–99)
Glucose-Capillary: 146 mg/dL — ABNORMAL HIGH (ref 70–99)
Glucose-Capillary: 128 mg/dL — ABNORMAL HIGH (ref 70–99)

## 2019-11-05 LAB — BASIC METABOLIC PANEL
Anion gap: 5 (ref 5–15)
BUN: 10 mg/dL (ref 8–23)
CO2: 22 mmol/L (ref 22–32)
Calcium: 8.8 mg/dL — ABNORMAL LOW (ref 8.9–10.3)
Chloride: 110 mmol/L (ref 98–111)
Creatinine, Ser: 1.07 mg/dL — ABNORMAL HIGH (ref 0.44–1.00)
GFR calc Af Amer: >60 mL/min (ref >60)
GFR calc non Af Amer: 53 mL/min — ABNORMAL LOW (ref >60)
Glucose, Bld: 108 mg/dL — ABNORMAL HIGH (ref 70–99)
Potassium: 4.2 mmol/L (ref 3.5–5.1)
Sodium: 137 mmol/L (ref 135–145)

## 2019-11-05 LAB — CBC
HCT: 25.9 % — ABNORMAL LOW (ref 36.0–46.0)
Hemoglobin: 8.1 g/dL — ABNORMAL LOW (ref 12.0–15.0)
MCH: 28 pg (ref 26.0–34.0)
MCHC: 31.3 g/dL (ref 30.0–36.0)
MCV: 89.6 fL (ref 80.0–100.0)
Platelets: 299 10*3/uL (ref 150–400)
RBC: 2.89 MIL/uL — ABNORMAL LOW (ref 3.87–5.11)
RDW: 13.7 % (ref 11.5–15.5)
WBC: 5.6 10*3/uL (ref 4.0–10.5)
nRBC: 0 % (ref 0.0–0.2)

## 2019-11-05 SURGERY — EGD (ESOPHAGOGASTRODUODENOSCOPY)
Anesthesia: General

## 2019-11-05 MED ORDER — PROPOFOL 10 MG/ML IV BOLUS
INTRAVENOUS | Status: AC
  Filled 2019-11-05: qty 20

## 2019-11-05 MED ORDER — ONDANSETRON HCL 4 MG/2ML IJ SOLN: 4.0000 mg | Freq: Once | INTRAMUSCULAR | Status: DC | PRN

## 2019-11-05 MED ORDER — SODIUM CHLORIDE 0.9 % IV SOLN: INTRAVENOUS | Status: DC | PRN

## 2019-11-05 MED ORDER — ASPIRIN 81 MG PO CHEW
81.0000 mg | CHEWABLE_TABLET | Freq: Every day | ORAL | Status: DC
  Administered 2019-11-05 – 2019-11-06 (×2): 81 mg via ORAL
  Filled 2019-11-05 (×2): qty 1

## 2019-11-05 MED ORDER — PRASUGREL HCL 10 MG PO TABS
10.0000 mg | ORAL_TABLET | Freq: Every day | ORAL | Status: DC
  Administered 2019-11-05 – 2019-11-06 (×2): 10 mg via ORAL
  Filled 2019-11-05 (×2): qty 1

## 2019-11-05 MED ORDER — SODIUM CHLORIDE 0.9 % IV SOLN: INTRAVENOUS | Status: DC

## 2019-11-05 MED ORDER — SODIUM CHLORIDE 0.9 % IV SOLN
510.0000 mg | Freq: Once | INTRAVENOUS | Status: AC
  Administered 2019-11-05: 09:00:00 510 mg via INTRAVENOUS
  Filled 2019-11-05: qty 17

## 2019-11-05 MED ORDER — PROPOFOL 500 MG/50ML IV EMUL
INTRAVENOUS | Status: DC | PRN
  Administered 2019-11-05: 100 ug/kg/min via INTRAVENOUS

## 2019-11-05 MED ORDER — FENTANYL CITRATE (PF) 100 MCG/2ML IJ SOLN: 25.0000 ug | INTRAMUSCULAR | Status: DC | PRN

## 2019-11-05 MED ORDER — FAMOTIDINE 20 MG PO TABS
20.0000 mg | ORAL_TABLET | Freq: Every day | ORAL | Status: DC
  Administered 2019-11-05 – 2019-11-06 (×2): 20 mg via ORAL
  Filled 2019-11-05 (×2): qty 1

## 2019-11-05 MED ORDER — PROPOFOL 10 MG/ML IV BOLUS
INTRAVENOUS | Status: DC | PRN
  Administered 2019-11-05: 20 mg via INTRAVENOUS

## 2019-11-05 NOTE — Progress Notes (Signed)
Dr Alice Reichert ordered tap water enema now.

## 2019-11-05 NOTE — Progress Notes (Signed)
[facility] Cardiology  SUBJECTIVE: Patient laying in bed, denies chest pain or shortness of breath   Vitals:   11/04/19 1146 11/04/19 1931 11/05/19 0436 11/05/19 0814  BP: (!) 147/75 107/86 (!) 112/50 (!) 117/54  Pulse: 84 88 71 84  Resp:  18 20 20   Temp: 98.2 F (36.8 C) 98.1 F (36.7 C) 97.8 F (36.6 C) 98.2 F (36.8 C)  TempSrc: Oral Oral Oral Oral  SpO2: 100% 100% 100% 100%  Weight:      Height:         Intake/Output Summary (Last 24 hours) at 11/05/2019 [id number] Last data filed at 11/05/2019 [id number] Gross per 24 hour  Intake 3013.08 ml  Output 1600 ml  Net 1413.08 ml      PHYSICAL EXAM  General: Well developed, well nourished, in no acute distress HEENT:  Normocephalic and atramatic Neck:  No JVD.  Lungs: Clear bilaterally to auscultation and percussion. Heart: HRRR . Normal S1 and S2 without gallops or murmurs.  Abdomen: Bowel sounds are positive, abdomen soft and non-tender  Msk:  Back normal, normal gait. Normal strength and tone for age. Extremities: No clubbing, cyanosis or edema.   Neuro: Alert and oriented X 3. Psych:  Good affect, responds appropriately   LABS: Basic Metabolic Panel: Recent Labs    11/04/19 0546 11/05/19 0513  NA 137 137  K 4.5 4.2  CL 110 110  CO2 20* 22  GLUCOSE 109* 108*  BUN 20 10  CREATININE 1.07* 1.07*  CALCIUM 8.7* 8.8*   Liver Function Tests: Recent Labs    11/03/19 1221  AST 23  ALT 15  ALKPHOS 75  BILITOT 0.5  PROT 7.4  ALBUMIN 4.3   No results for input(s): LIPASE, AMYLASE in the last 72 hours. CBC: Recent Labs    11/02/19 1008 11/03/19 1221 11/03/19 1831 11/04/19 0532 11/05/19 0513  WBC 5.9 5.4   < > 5.0 5.6  NEUTROABS 3.3 3.0  --   --   --   HGB 7.8* 7.5*   < > 8.0* 8.1*  HCT 24.0* 24.2*   < > 24.9* 25.9*  MCV 90 93.1   < > 90.2 89.6  PLT 340 325   < > 278 299   < > = values in this interval not displayed.   Cardiac Enzymes: No results for input(s): CKTOTAL, CKMB, CKMBINDEX, TROPONINI in the last 72  hours. BNP: Invalid input(s): POCBNP D-Dimer: No results for input(s): DDIMER in the last 72 hours. Hemoglobin A1C: No results for input(s): HGBA1C in the last 72 hours. Fasting Lipid Panel: No results for input(s): CHOL, HDL, LDLCALC, TRIG, CHOLHDL, LDLDIRECT in the last 72 hours. Thyroid Function Tests: Recent Labs    11/02/19 1008  TSH 1.640   Anemia Panel: Recent Labs    11/02/19 1008  FERRITIN 15  TIBC 353  IRON 17*    No results found.   Echo   TELEMETRY: Sinus rhythm:  ASSESSMENT AND PLAN:  Principal Problem:   GIB (gastrointestinal bleeding) Active Problems:   COPD (chronic obstructive pulmonary disease) (HCC)   Iron deficiency anemia   Essential hypertension, benign   CAD (coronary artery disease)   Symptomatic anemia   Type II diabetes mellitus with renal manifestations (HCC)   Hypothyroidism    1.  Coronary artery disease, status post stent mid RCA 08/2017, status post drug-eluting stent distal RCA 07/31/2019, on low-dose aspirin and Effient which is being held due to GI bleed, currently without chest pain or shortness of breath  2.  GI bleed, with marked anemia, prior history of small bowel AVM, recent history of melena, on aspirin and Effient which is being held, EGD and colonoscopy planned for today per Dr. Alice Reichert 3.  COPD, stable  Recommendations  1.  Agree with current therapy 2.  Resume aspirin and Effient as soon as feasible following EGD and colonoscopy 3.  Defer further cardiac diagnostics at this time  Sign off for now, please call if any questions   Isaias Cowman, MD, PhD, [facility] 11/05/2019 9:06 AM

## 2019-11-05 NOTE — Transfer of Care (Signed)
Immediate Anesthesia Transfer of Care Note  Patient: Leslie Olsen  Procedure(s) Performed: ESOPHAGOGASTRODUODENOSCOPY (EGD) (N/A ) COLONOSCOPY (N/A )  Patient Location: PACU  Anesthesia Type:General  Level of Consciousness: awake and alert   Airway & Oxygen Therapy: Patient Spontanous Breathing  Post-op Assessment: Report given to RN  Post vital signs: Reviewed and stable  Last Vitals:  Vitals Value Taken Time  BP 106/68 11/05/19 1142  Temp 36 C 11/05/19 1142  Pulse 100 11/05/19 1140  Resp 16 11/05/19 1142  SpO2 99 % 11/05/19 1142    Last Pain:  Vitals:   11/05/19 1140  TempSrc:   PainSc: Asleep         Complications: No apparent anesthesia complications

## 2019-11-05 NOTE — Op Note (Signed)
[facility] Gastroenterology Patient Name: Leslie Olsen Procedure Date: 11/05/2019 10:55 AM MRN: [id number] Account #: [id number] Date of Birth: 02-Oct-1950 Admit Type: Outpatient Age: 69 Room: [facility] ENDO ROOM 2 Gender: Female Note Status: Finalized Procedure:             Colonoscopy Indications:           Melena, Acute post hemorrhagic anemia Providers:             Benay Pike. Rashay Barnette MD, MD Medicines:             Propofol per Anesthesia Complications:         No immediate complications. Estimated blood loss: None. Procedure:             Pre-Anesthesia Assessment:                        - The risks and benefits of the procedure and the                         sedation options and risks were discussed with the                         patient. All questions were answered and informed                         consent was obtained.                        - Patient identification and proposed procedure were                         verified prior to the procedure by the nurse. The                         procedure was verified in the procedure room.                        - ASA Grade Assessment: III - A patient with severe                         systemic disease.                        - After reviewing the risks and benefits, the patient                         was deemed in satisfactory condition to undergo the                         procedure.                        After obtaining informed consent, the colonoscope was                         passed under direct vision. Throughout the procedure,                         the patient's blood pressure, pulse, and oxygen  saturations were monitored continuously. The                         Colonoscope was introduced through the anus and                         advanced to the the cecum, identified by appendiceal                         orifice and ileocecal valve. The colonoscopy was           somewhat difficult due to significant looping.                         Successful completion of the procedure was aided by                         applying abdominal pressure. The patient tolerated the                         procedure well. The quality of the bowel preparation                         was adequate. The ileocecal valve, appendiceal                         orifice, and rectum were photographed. Findings:      The perianal and digital rectal examinations were normal. Pertinent       negatives include normal sphincter tone and no palpable rectal lesions.      A single medium-sized localized angioectasia without bleeding was found       in the cecum. Coagulation for bleeding prevention using argon plasma at       2 liters/minute and 30 watts was successful. Estimated blood loss: none.      A segmental area of mild melanosis was found in the rectum and in the       sigmoid colon.      There is no endoscopic evidence of bleeding, inflammation, mass, polyps       or ulcerations in the colon.      A few medium-mouthed diverticula were found in the transverse colon. Impression:            - A single non-bleeding colonic angioectasia. Treated                         with argon plasma coagulation (APC).                        - Melanosis in the colon.                        - No specimens collected. Recommendation:        - Patient has a contact number available for                         emergencies. The signs and symptoms of potential                         delayed complications were discussed with the patient.  Return to normal activities tomorrow. Written                         discharge instructions were provided to the patient.                        - Cardiac diet.                        - Continue present medications.                        - Resume Effient (prasugrel) at prior dose today.                         Refer to managing physician  for further adjustment of                         therapy.                        - The findings and recommendations were discussed with                         the patient.                        - Will monitor H/H.                        Continuing to follow. Procedure Code(s):     --- Professional ---                        [phone], Colonoscopy, flexible; with control of                         bleeding, any method Diagnosis Code(s):     --- Professional ---                        D62, Acute posthemorrhagic anemia                        K92.1, Melena (includes Hematochezia)                        K63.89, Other specified diseases of intestine                        K55.20, Angiodysplasia of colon without hemorrhage CPT copyright 2019 American Medical Association. All rights reserved. The codes documented in this report are preliminary and upon coder review may  be revised to meet current compliance requirements. Efrain Sella MD, MD 11/05/2019 12:05:55 PM This report has been signed electronically. Number of Addenda: 0 Note Initiated On: 11/05/2019 10:55 AM Scope Withdrawal Time: 0 hours 6 minutes 1 second  Total Procedure Duration: 0 hours 14 minutes 43 seconds  Estimated Blood Loss:  Estimated blood loss: none. Estimated blood loss: none.      [facility]

## 2019-11-05 NOTE — Progress Notes (Signed)
PROGRESS NOTE    Leslie Olsen  [id number] DOB: 1951/07/28 DOA: 11/03/2019 PCP: Efrain Sella, MD   Brief Narrative:  Leslie Olsen is a 69 y.o. with PMH of CAD s/p stent 08/2017 and 07/2019, HTN, HLD, COPD, diabetes c/b gastroparesis, Hx of SIBO, Hx of RMSF, and GERD who presented to the [facility] ED for marked anemia at request of her gastroenterologist - Dr. Allen Norris. Patient was seen as an outpatient by Dr. Allen Norris yesterday where labs showed marked anemia with hemoglobin 7.8, hematocrit 24, iron sat 5%, ferritin 15. There is a 4-gram drop in her hemoglobin from 3 months ago. She reports over the past two months she has been experiencing dark and tarry stools daily, sometimes 2-3 stools daily.  Also experiencing exertional dyspnea and decreased energy level.  Admitted for symptomatic anemia secondary to GI bleed.  Patient was on daily aspirin and Effient due to recent stent placement.  Subjective: Patient had a rough night last night due to colonic prep.  Denies any more abdominal pain.  Waiting for her EGD and colonoscopy.  Assessment & Plan:   Principal Problem:   GIB (gastrointestinal bleeding) Active Problems:   COPD (chronic obstructive pulmonary disease) (HCC)   Iron deficiency anemia   Essential hypertension, benign   CAD (coronary artery disease)   Symptomatic anemia   Type II diabetes mellitus with renal manifestations (HCC)   Hypothyroidism  GIB (gastrointestinal bleeding):  Hemoglobin dropped to 7.5 from 11.4 in 3 months on admission.  Improved to 8.6 after 1 unit, it was 8.1 today.  No obvious bleeding since in hospital.  And has an history of bleeding AVMs in the past, now on dual antiplatelets for stent placement. GI was consulted-appreciate their recommendations. Patient underwent EGD and colonoscopy today and cauterization was performed for multiple AVMs in her GI track.  Per GI it is okay to restart home dose of dual antiplatelets and monitor. -Resume diet. -Restart  home aspirin and Effient. -Continue to monitor. -Transfuse if hemoglobin drops below 7.  CAD (coronary artery disease): s/p of stent. Pt is on ASA and Effient. Card is consulted, per Dr. Saralyn Pilar, "Continue aspirin and Effient uninterrupted if possible. If aspirin and/or Effient held, resume as soon as possible whenbleeding stabilized. If Effient held, antiplatelet effects will persist 5 to 7 days" - ASA and Effient were held given significant hemoglobin drop and for procedures, they will be restarted today.  COPD (chronic obstructive pulmonary disease) ([place]): stable -prn albuterol  HTN:  -Continue home medications: Lisinopril -hydralazine prn  Symptomatic anemia and iron deficiency anemia -Continue iron supplement -Transfuse 1 unit of blood as above  Type II diabetes mellitus with renal manifestations ([place]): Most recent A1c 5.9, well controled. Patient is taking Metformin at home -SSI  Hypothyroidism -Synthroid   Objective: Vitals:   11/05/19 1142 11/05/19 1155 11/05/19 1203 11/05/19 1250  BP: 106/68 131/66 134/61 136/62  Pulse:  93 92 92  Resp: 16 16 18 20   Temp: (!) 96.8 F (36 C)   97.8 F (36.6 C)  TempSrc:    Oral  SpO2: 99% 99% 100% 100%  Weight:      Height:        Intake/Output Summary (Last 24 hours) at 11/05/2019 1308 Last data filed at 11/05/2019 1142 Gross per 24 hour  Intake 3413.08 ml  Output 1600 ml  Net 1813.08 ml   Filed Weights   11/03/19 1125  Weight: 86.3 kg    Examination:  General exam: Appears calm and  comfortable  Respiratory system: Clear to auscultation. Respiratory effort normal. Cardiovascular system: S1 & S2 heard, RRR. No JVD, murmurs, rubs, gallops or clicks. Gastrointestinal system: Soft, non tender, nondistended, bowel sounds positive. Central nervous system: Alert and oriented. No focal neurological deficits.Symmetric 5 x 5 power. Extremities: No edema, no cyanosis, pulses intact and symmetrical. Skin: No rashes,  lesions or ulcers Psychiatry: Judgement and insight appear normal. Mood & affect appropriate.    DVT prophylaxis: SCDs Code Status: Full Family Communication: No family at bedside. Disposition Plan: Patient has cauterization of multiple AVMs today.  We will monitor for 1 more day and will be discharged home tomorrow if hemoglobin remained stable and no more bleeding.  Dual antiplatelets were also being started again today.  Consultants:   GI  Procedures:  Antimicrobials:   Data Reviewed: I have personally reviewed following labs and imaging studies  CBC: Recent Labs  Lab 11/02/19 1008 11/03/19 1221 11/03/19 1831 11/04/19 0106 11/04/19 0532 11/05/19 0513  WBC 5.9 5.4 5.9 5.6 5.0 5.6  NEUTROABS 3.3 3.0  --   --   --   --   HGB 7.8* 7.5* 8.6* 8.3* 8.0* 8.1*  HCT 24.0* 24.2* 27.8* 26.6* 24.9* 25.9*  MCV 90 93.1 90.8 90.8 90.2 89.6  PLT 340 325 311 281 278 [id number]   Basic Metabolic Panel: Recent Labs  Lab 11/03/19 1221 11/04/19 0546 11/05/19 0513  NA 135 137 137  K 4.9 4.5 4.2  CL 107 110 110  CO2 22 20* 22  GLUCOSE 135* 109* 108*  BUN 29* 20 10  CREATININE 1.17* 1.07* 1.07*  CALCIUM 9.1 8.7* 8.8*   GFR: Estimated Creatinine Clearance: 54.1 mL/min (A) (by C-G formula based on SCr of 1.07 mg/dL (H)). Liver Function Tests: Recent Labs  Lab 11/03/19 1221  AST 23  ALT 15  ALKPHOS 75  BILITOT 0.5  PROT 7.4  ALBUMIN 4.3   No results for input(s): LIPASE, AMYLASE in the last 168 hours. No results for input(s): AMMONIA in the last 168 hours. Coagulation Profile: Recent Labs  Lab 11/03/19 1221  INR 0.9   Cardiac Enzymes: No results for input(s): CKTOTAL, CKMB, CKMBINDEX, TROPONINI in the last 168 hours. BNP (last 3 results) No results for input(s): PROBNP in the last 8760 hours. HbA1C: No results for input(s): HGBA1C in the last 72 hours. CBG: Recent Labs  Lab 11/04/19 0804 11/04/19 1144 11/04/19 1628 11/04/19 2129 11/05/19 0735  GLUCAP 113* 122* 76  109* 125*   Lipid Profile: No results for input(s): CHOL, HDL, LDLCALC, TRIG, CHOLHDL, LDLDIRECT in the last 72 hours. Thyroid Function Tests: No results for input(s): TSH, T4TOTAL, FREET4, T3FREE, THYROIDAB in the last 72 hours. Anemia Panel: No results for input(s): VITAMINB12, FOLATE, FERRITIN, TIBC, IRON, RETICCTPCT in the last 72 hours. Sepsis Labs: No results for input(s): PROCALCITON, LATICACIDVEN in the last 168 hours.  Recent Results (from the past 240 hour(s))  SARS CORONAVIRUS 2 (TAT 6-24 HRS) Nasopharyngeal Nasopharyngeal Swab     Status: None   Collection Time: 11/03/19 12:21 PM   Specimen: Nasopharyngeal Swab  Result Value Ref Range Status   SARS Coronavirus 2 NEGATIVE NEGATIVE Final    Comment: (NOTE) SARS-CoV-2 target nucleic acids are NOT DETECTED. The SARS-CoV-2 RNA is generally detectable in upper and lower respiratory specimens during the acute phase of infection. Negative results do not preclude SARS-CoV-2 infection, do not rule out co-infections with other pathogens, and should not be used as the sole basis for treatment or other patient management  decisions. Negative results must be combined with clinical observations, patient history, and epidemiological information. The expected result is Negative. Fact Sheet for Patients: SugarRoll.be Fact Sheet for Healthcare Providers: https://www.woods-mathews.com/ This test is not yet approved or cleared by the [place] FDA and  has been authorized for detection and/or diagnosis of SARS-CoV-2 by FDA under an Emergency Use Authorization (EUA). This EUA will remain  in effect (meaning this test can be used) for the duration of the COVID-19 declaration under Section 56 4(b)(1) of the Act, 21 U.S.C. section 360bbb-3(b)(1), unless the authorization is terminated or revoked sooner. Performed at [facility], [place] [address] [place], [place] [address]      Radiology  Studies: No results found.  Scheduled Meds: . aspirin  81 mg Oral Daily  . cholecalciferol  2,000 Units Oral Daily  . clonazePAM  0.5 mg Oral QHS  . ferrous gluconate  324 mg Oral Daily  . insulin aspart  0-5 Units Subcutaneous QHS  . insulin aspart  0-9 Units Subcutaneous TID WC  . levETIRAcetam  500 mg Oral QHS  . levothyroxine  100 mcg Oral Q0600  . lisinopril  10 mg Oral Daily  . [START ON 11/07/2019] pantoprazole  40 mg Intravenous Q12H  . prasugrel  10 mg Oral Daily  . sodium chloride flush  10-40 mL Intracatheter Q12H   Continuous Infusions: . sodium chloride 75 mL/hr at 11/05/19 0727  . pantoprozole (PROTONIX) infusion 8 mg/hr (11/05/19 0727)     LOS: 2 days   Time spent: 40 minutes.  Lorella Nimrod, MD [facility]  If 7PM-7AM, please contact night-coverage [email or web address]  11/05/2019, 1:08 PM   This record has been created using Systems analyst. Errors have been sought and corrected,but may not always be located. Such creation errors do not reflect on the standard of care.

## 2019-11-05 NOTE — Interval H&P Note (Signed)
History and Physical Interval Note:  11/05/2019 11:01 AM  [name]  has presented today for surgery, with the diagnosis of na.  The various methods of treatment have been discussed with the patient and family. After consideration of risks, benefits and other options for treatment, the patient has consented to  Procedure(s): ESOPHAGOGASTRODUODENOSCOPY (EGD) (N/A) COLONOSCOPY (N/A) as a surgical intervention.  The patient's history has been reviewed, patient examined, no change in status, stable for surgery.  I have reviewed the patient's chart and labs.  Questions were answered to the patient's satisfaction.     [place], [place]

## 2019-11-05 NOTE — Op Note (Signed)
[facility] Patient Name: Leslie Olsen Procedure Date: 11/05/2019 10:55 AM MRN: [id number] Account #: [id number] Date of Birth: Apr 26, 1951 Admit Type: Outpatient Age: 69 Room: [facility] ENDO ROOM 2 Gender: Female Note Status: Finalized Procedure:             Upper GI endoscopy Indications:           Acute post hemorrhagic anemia, Melena Providers:             Benay Pike. Danaya Geddis MD, MD Medicines:             Monitored Anesthesia Care, Propofol per Anesthesia Complications:         No immediate complications. Estimated blood loss: None. Procedure:             Pre-Anesthesia Assessment:                        - The risks and benefits of the procedure and the                         sedation options and risks were discussed with the                         patient. All questions were answered and informed                         consent was obtained.                        - Patient identification and proposed procedure were                         verified prior to the procedure by the nurse. The                         procedure was verified in the procedure room.                        - ASA Grade Assessment: III - A patient with severe                         systemic disease.                        - After reviewing the risks and benefits, the patient                         was deemed in satisfactory condition to undergo the                         procedure.                        After obtaining informed consent, the endoscope was                         passed under direct vision. Throughout the procedure,                         the patient's blood pressure, pulse, and oxygen  saturations were monitored continuously. The Endoscope                         was introduced through the mouth, and advanced to the                         third part of duodenum. The upper GI endoscopy was                         accomplished  without difficulty. The patient tolerated                         the procedure well. Findings:      A widely patent Schatzki ring was found in the distal esophagus.      A 1 cm hiatal hernia was present.      A single 6 mm angioectasia with no bleeding was found in the gastric       body. Coagulation for bleeding prevention using argon plasma at 2       liters/minute and 60 watts was successful. Estimated blood loss: none.      A single 3 mm angioectasia without bleeding was found in the duodenal       bulb. Coagulation for bleeding prevention using argon plasma at 2       liters/minute and 40 watts was successful.      The exam was otherwise without abnormality. Impression:            - Widely patent Schatzki ring.                        - 1 cm hiatal hernia.                        - A single non-bleeding angioectasia in the stomach.                         Treated with argon plasma coagulation (APC).                        - A single non-bleeding angioectasia in the duodenum.                         Treated with argon plasma coagulation (APC).                        - The examination was otherwise normal.                        - No specimens collected. Recommendation:        - Proceed with colonoscopy Procedure Code(s):     --- Professional ---                        [phone], Esophagogastroduodenoscopy, flexible,                         transoral; with control of bleeding, any method Diagnosis Code(s):     --- Professional ---                        K92.1, Melena (  includes Hematochezia)                        D62, Acute posthemorrhagic anemia                        K31.819, Angiodysplasia of stomach and duodenum                         without bleeding                        K44.9, Diaphragmatic hernia without obstruction or                         gangrene                        K22.2, Esophageal obstruction CPT copyright 2019 American Medical Association. All rights reserved. The  codes documented in this report are preliminary and upon coder review may  be revised to meet current compliance requirements. Efrain Sella MD, MD 11/05/2019 11:22:13 AM This report has been signed electronically. Number of Addenda: 0 Note Initiated On: 11/05/2019 10:55 AM Estimated Blood Loss:  Estimated blood loss: none.      [facility]

## 2019-11-05 NOTE — Anesthesia Preprocedure Evaluation (Signed)
Anesthesia Evaluation  Patient identified by MRN, date of birth, ID band Patient awake    Reviewed: Allergy & Precautions, NPO status , Patient's Chart, lab work & pertinent test results  History of Anesthesia Complications Negative for: history of anesthetic complications  Airway Mallampati: III       Dental  (+) Upper Dentures, Lower Dentures   Pulmonary shortness of breath, sleep apnea and Continuous Positive Airway Pressure Ventilation , COPD,  COPD inhaler, former smoker,           Cardiovascular hypertension, Pt. on medications + CAD       Neuro/Psych  Headaches, PSYCHIATRIC DISORDERS Depression    GI/Hepatic GERD  Medicated,(+) Hepatitis -, Unspecified  Endo/Other  diabetes, Type 2, Oral Hypoglycemic AgentsHypothyroidism   Renal/GU negative Renal ROS     Musculoskeletal   Abdominal   Peds negative pediatric ROS (+)  Hematology  (+) anemia ,   Anesthesia Other Findings   Reproductive/Obstetrics                             Anesthesia Physical  Anesthesia Plan  ASA: III  Anesthesia Plan: General   Post-op Pain Management:    Induction: Intravenous  PONV Risk Score and Plan: Propofol infusion  Airway Management Planned: Nasal Cannula  Additional Equipment:   Intra-op Plan:   Post-operative Plan:   Informed Consent: I have reviewed the patients History and Physical, chart, labs and discussed the procedure including the risks, benefits and alternatives for the proposed anesthesia with the patient or authorized representative who has indicated his/her understanding and acceptance.     Dental advisory given  Plan Discussed with: CRNA and Surgeon  Anesthesia Plan Comments:         Anesthesia Quick Evaluation

## 2019-11-06 ENCOUNTER — Encounter: Payer: Self-pay | Admitting: *Deleted

## 2019-11-06 DIAGNOSIS — C801 Malignant (primary) neoplasm, unspecified: Secondary | ICD-10-CM

## 2019-11-06 HISTORY — DX: Malignant (primary) neoplasm, unspecified: C80.1

## 2019-11-06 LAB — BASIC METABOLIC PANEL
Anion gap: 6 (ref 5–15)
BUN: 8 mg/dL (ref 8–23)
CO2: 23 mmol/L (ref 22–32)
Calcium: 9 mg/dL (ref 8.9–10.3)
Chloride: 111 mmol/L (ref 98–111)
Creatinine, Ser: 1.13 mg/dL — ABNORMAL HIGH (ref 0.44–1.00)
GFR calc Af Amer: 57 mL/min — ABNORMAL LOW (ref >60)
GFR calc non Af Amer: 49 mL/min — ABNORMAL LOW (ref >60)
Glucose, Bld: 130 mg/dL — ABNORMAL HIGH (ref 70–99)
Potassium: 4 mmol/L (ref 3.5–5.1)
Sodium: 140 mmol/L (ref 135–145)

## 2019-11-06 LAB — TYPE AND SCREEN
ABO/RH(D): O POS
Antibody Screen: NEGATIVE
Unit division: 0

## 2019-11-06 LAB — BPAM RBC
Blood Product Expiration Date: 202103272359
ISSUE DATE / TIME: 202102261532
Unit Type and Rh: 5100

## 2019-11-06 LAB — CBC
HCT: 25.8 % — ABNORMAL LOW (ref 36.0–46.0)
Hemoglobin: 8 g/dL — ABNORMAL LOW (ref 12.0–15.0)
MCH: 27.9 pg (ref 26.0–34.0)
MCHC: 31 g/dL (ref 30.0–36.0)
MCV: 89.9 fL (ref 80.0–100.0)
Platelets: 263 10*3/uL (ref 150–400)
RBC: 2.87 MIL/uL — ABNORMAL LOW (ref 3.87–5.11)
RDW: 13.6 % (ref 11.5–15.5)
WBC: 5.9 10*3/uL (ref 4.0–10.5)
nRBC: 0 % (ref 0.0–0.2)

## 2019-11-06 LAB — GLUCOSE, CAPILLARY
Glucose-Capillary: 114 mg/dL — ABNORMAL HIGH (ref 70–99)
Glucose-Capillary: 112 mg/dL — ABNORMAL HIGH (ref 70–99)

## 2019-11-06 MED ORDER — LISINOPRIL 10 MG PO TABS: 10.0000 mg | ORAL_TABLET | Freq: Every day | ORAL | 0 refills | Status: DC

## 2019-11-06 NOTE — Care Management Important Message (Signed)
Important Message  Patient Details  Name: Leslie Olsen MRN: [id number] Date of Birth: 1950/10/08   Medicare Important Message Given:  Yes     Dannette Barbara 11/06/2019, 11:06 AM

## 2019-11-06 NOTE — Discharge Summary (Signed)
Physician Discharge Summary  Leslie Olsen [id number] DOB: April 02, 1951 DOA: 11/03/2019  PCP: Efrain Sella, MD  Admit date: 11/03/2019 Discharge date: 11/06/2019  Admitted From: Home Disposition: Home  Recommendations for Outpatient Follow-up:  1. Follow up with PCP in 1-2 weeks 2. Follow-up with gastroenterology. 3. Please obtain BMP/CBC in one week 4. Please follow up on the following pending results: Pathology  Home Health: No Equipment/Devices: None Discharge Condition: Stable CODE STATUS: Full Diet recommendation: Heart Healthy / Carb Modified   Brief/Interim Summary: Leslie Olsen a 69 y.o.with PMH of CAD s/p stent 08/2017 and 07/2019, HTN, HLD, COPD, diabetes c/b gastroparesis, Hx of SIBO, Hx of RMSF, and GERD who presented to the [facility] ED for marked anemia at request of her gastroenterologist - Dr. Allen Norris. Patient was seen as an outpatient by Dr. Allen Norris yesterday where labs showed marked anemia with hemoglobin 7.8, hematocrit 24, iron sat 5%, ferritin 15. There is a 4-gram drop in her hemoglobin from 3 months ago.She reports over the past two months she has been experiencing dark and tarry stools daily, sometimes 2-3 stools daily.  Also experiencing exertional dyspnea and decreased energy level.  Admitted for symptomatic anemia secondary to GI bleed.  Patient was on daily aspirin and Effient due to recent stent placement. Patient underwent EGD and colonoscopy with cauterization of multiple AVMs.  Patient also has a prior history of bleeding AVMs.  Initially we held her aspirin and Effient and which was restarted after the procedure.  Cardiology does not want to hold her dual antiplatelet for a long due to recent history of stent placement. She was also given 1 unit of packed RBC and IV iron.  Hemoglobin on discharge day was 8.  She will continue with p.o. iron supplement and follow-up with primary care and gastroenterology as an outpatient.  She will continue rest of her  home meds.  Discharge Diagnoses:  Principal Problem:   Acute upper GI bleed Active Problems:   COPD (chronic obstructive pulmonary disease) (HCC)   Iron deficiency anemia   Essential hypertension, benign   CAD (coronary artery disease)   Symptomatic anemia   Type II diabetes mellitus with renal manifestations ([place])   Hypothyroidism   Discharge Instructions  Discharge Instructions    Diet - low sodium heart healthy   Complete by: As directed    Discharge instructions   Complete by: As directed    It was pleasure taking care of you. Please seek medical attention if you experience any more bleeding. Follow-up with your primary care physician in 1 to 2 weeks to have your hemoglobin checked. Continue taking your iron supplement and follow-up with gastroenterology.   Increase activity slowly   Complete by: As directed      Allergies as of 11/06/2019   No Known Allergies     Medication List    STOP taking these medications   oxybutynin 15 MG 24 hr tablet Commonly known as: DITROPAN XL     TAKE these medications   aspirin 81 MG tablet Take 81 mg by mouth daily.   cholecalciferol 25 MCG (1000 UNIT) tablet Commonly known as: VITAMIN D3 Take 2,000 Units by mouth daily.   clonazePAM 0.5 MG tablet Commonly known as: KLONOPIN Take 0.5 mg by mouth at bedtime.   ferrous gluconate 324 MG tablet Commonly known as: FERGON Take 324 mg by mouth daily.   glucose blood test strip Commonly known as: ONE TOUCH ULTRA TEST USE ONE STRIP TO CHECK GLUCOSE TWICE DAILY  levETIRAcetam 500 MG tablet Commonly known as: KEPPRA Take 500 mg by mouth daily.   lisinopril 10 MG tablet Commonly known as: ZESTRIL Take 1 tablet (10 mg total) by mouth daily. What changed:   medication strength  how much to take   metFORMIN 500 MG tablet Commonly known as: GLUCOPHAGE Take 1 tablet (500 mg total) by mouth daily with breakfast. What changed:   how much to take  when to take this    pantoprazole 40 MG tablet Commonly known as: PROTONIX Take 40 mg by mouth daily as needed (Heartburn).   prasugrel 10 MG Tabs tablet Commonly known as: EFFIENT Take 1 tablet (10 mg total) by mouth daily.   SUMAtriptan 100 MG tablet Commonly known as: IMITREX Take 1 tablet (100 mg total) by mouth every 2 (two) hours as needed for migraine. May repeat in 2 hours if headache persists or recurs.   Synthroid 100 MCG tablet Generic drug: levothyroxine Take 1 tablet (100 mcg total) by mouth daily before breakfast.      Follow-up Information    [place], Benay Pike, MD. Schedule an appointment as soon as possible for a visit.   Specialty: Gastroenterology Contact information: [place] [address] [phone]          No Known Allergies  Consultations:  GI  Cardiology  Procedures/Studies: MM 3D SCREEN BREAST BILATERAL  Result Date: 10/24/2019 CLINICAL DATA:  Screening. EXAM: DIGITAL SCREENING BILATERAL MAMMOGRAM WITH TOMO AND CAD COMPARISON:  Previous exam(s). ACR Breast Density Category b: There are scattered areas of fibroglandular density. FINDINGS: In the left breast, a possible asymmetry warrants further evaluation. In the right breast, no findings suspicious for malignancy. Images were processed with CAD. IMPRESSION: Further evaluation is suggested for possible asymmetry in the left breast. RECOMMENDATION: Diagnostic mammogram and possibly ultrasound of the left breast. (Code:[id number]) The patient will be contacted regarding the findings, and additional imaging will be scheduled. BI-RADS CATEGORY  0: Incomplete. Need additional imaging evaluation and/or prior mammograms for comparison. Electronically Signed   By: Audie Pinto M.D.   On: 10/24/2019 15:22    Subjective: Patient was feeling better on the day of discharge.  Denies any new complaints.  No hematemesis or melena.  Discharge Exam: Vitals:   11/06/19 0453 11/06/19 1145  BP: (!) 128/53  (!) 113/55  Pulse: 72 (!) 58  Resp: 16   Temp: 97.8 F (36.6 C) 98 F (36.7 C)  SpO2: 100% 100%   Vitals:   11/05/19 1250 11/05/19 1957 11/06/19 0453 11/06/19 1145  BP: 136/62 135/74 (!) 128/53 (!) 113/55  Pulse: 92 91 72 (!) 58  Resp: 20 20 16    Temp: 97.8 F (36.6 C) 98.2 F (36.8 C) 97.8 F (36.6 C) 98 F (36.7 C)  TempSrc: Oral Oral Oral   SpO2: 100% 100% 100% 100%  Weight:      Height:        General: Pt is alert, awake, not in acute distress Cardiovascular: RRR, S1/S2 +, no rubs, no gallops Respiratory: CTA bilaterally, no wheezing, no rhonchi Abdominal: Soft, NT, ND, bowel sounds + Extremities: no edema, no cyanosis   The results of significant diagnostics from this hospitalization (including imaging, microbiology, ancillary and laboratory) are listed below for reference.    Microbiology: Recent Results (from the past 240 hour(s))  SARS CORONAVIRUS 2 (TAT 6-24 HRS) Nasopharyngeal Nasopharyngeal Swab     Status: None   Collection Time: 11/03/19 12:21 PM   Specimen: Nasopharyngeal Swab  Result  Value Ref Range Status   SARS Coronavirus 2 NEGATIVE NEGATIVE Final    Comment: (NOTE) SARS-CoV-2 target nucleic acids are NOT DETECTED. The SARS-CoV-2 RNA is generally detectable in upper and lower respiratory specimens during the acute phase of infection. Negative results do not preclude SARS-CoV-2 infection, do not rule out co-infections with other pathogens, and should not be used as the sole basis for treatment or other patient management decisions. Negative results must be combined with clinical observations, patient history, and epidemiological information. The expected result is Negative. Fact Sheet for Patients: SugarRoll.be Fact Sheet for Healthcare Providers: https://www.woods-mathews.com/ This test is not yet approved or cleared by the [place] FDA and  has been authorized for detection and/or diagnosis of  SARS-CoV-2 by FDA under an Emergency Use Authorization (EUA). This EUA will remain  in effect (meaning this test can be used) for the duration of the COVID-19 declaration under Section 56 4(b)(1) of the Act, 21 U.S.C. section 360bbb-3(b)(1), unless the authorization is terminated or revoked sooner. Performed at [facility], [place] [address], [place], [place] [address]      Labs: BNP (last 3 results) Recent Labs    11/03/19 1221  BNP [id number]   Basic Metabolic Panel: Recent Labs  Lab 11/03/19 1221 11/04/19 0546 11/05/19 0513 11/06/19 0415  NA 135 137 137 140  K 4.9 4.5 4.2 4.0  CL 107 110 110 111  CO2 22 20* 22 23  GLUCOSE 135* 109* 108* 130*  BUN 29* 20 10 8   CREATININE 1.17* 1.07* 1.07* 1.13*  CALCIUM 9.1 8.7* 8.8* 9.0   Liver Function Tests: Recent Labs  Lab 11/03/19 1221  AST 23  ALT 15  ALKPHOS 75  BILITOT 0.5  PROT 7.4  ALBUMIN 4.3   No results for input(s): LIPASE, AMYLASE in the last 168 hours. No results for input(s): AMMONIA in the last 168 hours. CBC: Recent Labs  Lab 11/02/19 1008 11/03/19 1221 11/03/19 1221 11/03/19 1831 11/04/19 0106 11/04/19 0532 11/05/19 0513 11/06/19 0415  WBC 5.9 5.4   < > 5.9 5.6 5.0 5.6 5.9  NEUTROABS 3.3 3.0  --   --   --   --   --   --   HGB 7.8* 7.5*   < > 8.6* 8.3* 8.0* 8.1* 8.0*  HCT 24.0* 24.2*   < > 27.8* 26.6* 24.9* 25.9* 25.8*  MCV 90 93.1   < > 90.8 90.8 90.2 89.6 89.9  PLT 340 325   < > 311 281 278 299 263   < > = values in this interval not displayed.   Cardiac Enzymes: No results for input(s): CKTOTAL, CKMB, CKMBINDEX, TROPONINI in the last 168 hours. BNP: Invalid input(s): POCBNP CBG: Recent Labs  Lab 11/05/19 0735 11/05/19 1709 11/05/19 2108 11/06/19 0805 11/06/19 1143  GLUCAP 125* 146* 128* 114* 112*   D-Dimer No results for input(s): DDIMER in the last 72 hours. Hgb A1c No results for input(s): HGBA1C in the last 72 hours. Lipid Profile No results for input(s): CHOL, HDL,  LDLCALC, TRIG, CHOLHDL, LDLDIRECT in the last 72 hours. Thyroid function studies No results for input(s): TSH, T4TOTAL, T3FREE, THYROIDAB in the last 72 hours.  Invalid input(s): FREET3 Anemia work up No results for input(s): VITAMINB12, FOLATE, FERRITIN, TIBC, IRON, RETICCTPCT in the last 72 hours. Urinalysis    Component Value Date/Time   COLORURINE Yellow 12/16/2014 1303   APPEARANCEUR Clear 02/27/2019 0900   LABSPEC 1.025 12/16/2014 1303   PHURINE 5.0 12/16/2014 1303  GLUCOSEU Negative 02/27/2019 0900   GLUCOSEU >=500 12/16/2014 1303   HGBUR Negative 12/16/2014 1303   BILIRUBINUR Negative 02/27/2019 0900   BILIRUBINUR Negative 12/16/2014 1303   KETONESUR Trace 12/16/2014 1303   PROTEINUR Negative 02/27/2019 0900   PROTEINUR 30 mg/dL 12/16/2014 1303   NITRITE Negative 02/27/2019 0900   NITRITE Negative 12/16/2014 1303   LEUKOCYTESUR Negative 02/27/2019 0900   LEUKOCYTESUR Negative 12/16/2014 1303   Sepsis Labs Invalid input(s): PROCALCITONIN,  WBC,  LACTICIDVEN Microbiology Recent Results (from the past 240 hour(s))  SARS CORONAVIRUS 2 (TAT 6-24 HRS) Nasopharyngeal Nasopharyngeal Swab     Status: None   Collection Time: 11/03/19 12:21 PM   Specimen: Nasopharyngeal Swab  Result Value Ref Range Status   SARS Coronavirus 2 NEGATIVE NEGATIVE Final    Comment: (NOTE) SARS-CoV-2 target nucleic acids are NOT DETECTED. The SARS-CoV-2 RNA is generally detectable in upper and lower respiratory specimens during the acute phase of infection. Negative results do not preclude SARS-CoV-2 infection, do not rule out co-infections with other pathogens, and should not be used as the sole basis for treatment or other patient management decisions. Negative results must be combined with clinical observations, patient history, and epidemiological information. The expected result is Negative. Fact Sheet for Patients: SugarRoll.be Fact Sheet for Healthcare  Providers: https://www.woods-mathews.com/ This test is not yet approved or cleared by the [place] FDA and  has been authorized for detection and/or diagnosis of SARS-CoV-2 by FDA under an Emergency Use Authorization (EUA). This EUA will remain  in effect (meaning this test can be used) for the duration of the COVID-19 declaration under Section 56 4(b)(1) of the Act, 21 U.S.C. section 360bbb-3(b)(1), unless the authorization is terminated or revoked sooner. Performed at [facility], [place] [address], [place], [place] [address]     Time coordinating discharge: Over 30 minutes  SIGNED:  Lorella Nimrod, MD  [facility] 11/06/2019, 12:10 PM  If 7PM-7AM, please contact night-coverage [email or web address]  This record has been created using Systems analyst. Errors have been sought and corrected,but may not always be located. Such creation errors do not reflect on the standard of care.

## 2019-11-07 ENCOUNTER — Telehealth: Payer: Self-pay | Admitting: Gastroenterology

## 2019-11-07 ENCOUNTER — Other Ambulatory Visit
Admission: RE | Admit: 2019-11-07 | Discharge: 2019-11-07 | Disposition: A | Discharge status: Home / Self Care | Payer: PPO | Source: Home / Self Care | Attending: Gastroenterology | Admitting: Gastroenterology

## 2019-11-07 ENCOUNTER — Ambulatory Visit
Admission: RE | Admit: 2019-11-07 | Discharge: 2019-11-07 | Disposition: A | Discharge status: Home / Self Care | Payer: PPO | Source: Ambulatory Visit | Attending: Internal Medicine | Admitting: Internal Medicine

## 2019-11-07 ENCOUNTER — Other Ambulatory Visit: Payer: Self-pay

## 2019-11-07 DIAGNOSIS — N6320 Unspecified lump in the left breast, unspecified quadrant: Secondary | ICD-10-CM | POA: Diagnosis not present

## 2019-11-07 DIAGNOSIS — N6489 Other specified disorders of breast: Secondary | ICD-10-CM

## 2019-11-07 DIAGNOSIS — R928 Other abnormal and inconclusive findings on diagnostic imaging of breast: Secondary | ICD-10-CM

## 2019-11-07 DIAGNOSIS — Z1231 Encounter for screening mammogram for malignant neoplasm of breast: Secondary | ICD-10-CM | POA: Diagnosis not present

## 2019-11-07 DIAGNOSIS — K921 Melena: Secondary | ICD-10-CM

## 2019-11-07 DIAGNOSIS — N6323 Unspecified lump in the left breast, lower outer quadrant: Secondary | ICD-10-CM | POA: Diagnosis not present

## 2019-11-07 LAB — CBC WITH DIFFERENTIAL/PLATELET
Abs Immature Granulocytes: 0.06 10*3/uL (ref 0.00–0.07)
Basophils Absolute: 0.1 10*3/uL (ref 0.0–0.1)
Basophils Relative: 1 %
Eosinophils Absolute: 0.3 10*3/uL (ref 0.0–0.5)
Eosinophils Relative: 3 %
HCT: 29.5 % — ABNORMAL LOW (ref 36.0–46.0)
Hemoglobin: 9.2 g/dL — ABNORMAL LOW (ref 12.0–15.0)
Immature Granulocytes: 1 %
Lymphocytes Relative: 28 %
Lymphs Abs: 2.1 10*3/uL (ref 0.7–4.0)
MCH: 28 pg (ref 26.0–34.0)
MCHC: 31.2 g/dL (ref 30.0–36.0)
MCV: 89.9 fL (ref 80.0–100.0)
Monocytes Absolute: 0.7 10*3/uL (ref 0.1–1.0)
Monocytes Relative: 9 %
Neutro Abs: 4.5 10*3/uL (ref 1.7–7.7)
Neutrophils Relative %: 58 %
Platelets: 300 10*3/uL (ref 150–400)
RBC: 3.28 MIL/uL — ABNORMAL LOW (ref 3.87–5.11)
RDW: 13.8 % (ref 11.5–15.5)
WBC: 7.7 10*3/uL (ref 4.0–10.5)
nRBC: 0 % (ref 0.0–0.2)

## 2019-11-07 NOTE — Telephone Encounter (Signed)
Pt left vm for Ginger she went to the STAT lab work and would like a call from [employer]

## 2019-11-07 NOTE — Anesthesia Postprocedure Evaluation (Signed)
Anesthesia Post Note  Patient: Leslie Olsen  Procedure(s) Performed: ESOPHAGOGASTRODUODENOSCOPY (EGD) (N/A ) COLONOSCOPY (N/A )  Patient location during evaluation: Endoscopy Anesthesia Type: General Level of consciousness: awake and alert and oriented Pain management: pain level controlled Vital Signs Assessment: post-procedure vital signs reviewed and stable Respiratory status: spontaneous breathing Cardiovascular status: blood pressure returned to baseline Anesthetic complications: no     Last Vitals:  Vitals:   11/06/19 0453 11/06/19 1145  BP: (!) 128/53 (!) 113/55  Pulse: 72 (!) 58  Resp: 16   Temp: 36.6 C 36.7 C  SpO2: 100% 100%    Last Pain:  Vitals:   11/06/19 0912  TempSrc:   PainSc: 0-No pain                 Arkie Tagliaferro

## 2019-11-08 ENCOUNTER — Telehealth: Payer: Self-pay

## 2019-11-08 NOTE — Telephone Encounter (Signed)
-----   Message from Lucilla Lame, MD sent at 11/08/2019  5:41 AM EST ----- Let the patient know that her blood count went from 8.02 days ago to 9.2 yesterday so her blood count is 20.

## 2019-11-08 NOTE — Telephone Encounter (Signed)
Pt notified of lab results. Advised pt to continue taking daily iron supplement and to contact me if symptoms worsen.

## 2019-11-09 ENCOUNTER — Other Ambulatory Visit: Payer: Self-pay | Admitting: Internal Medicine

## 2019-11-09 ENCOUNTER — Telehealth: Payer: Self-pay

## 2019-11-09 DIAGNOSIS — E785 Hyperlipidemia, unspecified: Secondary | ICD-10-CM | POA: Diagnosis not present

## 2019-11-09 DIAGNOSIS — R928 Other abnormal and inconclusive findings on diagnostic imaging of breast: Secondary | ICD-10-CM

## 2019-11-09 DIAGNOSIS — R0600 Dyspnea, unspecified: Secondary | ICD-10-CM | POA: Diagnosis not present

## 2019-11-09 DIAGNOSIS — Z955 Presence of coronary angioplasty implant and graft: Secondary | ICD-10-CM | POA: Diagnosis not present

## 2019-11-09 DIAGNOSIS — I10 Essential (primary) hypertension: Secondary | ICD-10-CM | POA: Diagnosis not present

## 2019-11-09 DIAGNOSIS — N6320 Unspecified lump in the left breast, unspecified quadrant: Secondary | ICD-10-CM

## 2019-11-09 DIAGNOSIS — E119 Type 2 diabetes mellitus without complications: Secondary | ICD-10-CM | POA: Diagnosis not present

## 2019-11-09 LAB — FECAL FAT, QUALITATIVE
Fat Qual Neutral, Stl: NORMAL
Fat Qual Total, Stl: NORMAL

## 2019-11-09 NOTE — Telephone Encounter (Signed)
Results sent to pt via mychart

## 2019-11-09 NOTE — Telephone Encounter (Signed)
-----   Message from Lucilla Lame, MD sent at 11/09/2019 10:37 AM EST ----- Let the patient know that the fecal fat in the stool was normal meaning that there is no malabsorption of the patient's that in his diet

## 2019-11-10 DIAGNOSIS — N63 Unspecified lump in unspecified breast: Secondary | ICD-10-CM | POA: Diagnosis not present

## 2019-11-10 DIAGNOSIS — K922 Gastrointestinal hemorrhage, unspecified: Secondary | ICD-10-CM | POA: Diagnosis not present

## 2019-11-10 DIAGNOSIS — I2510 Atherosclerotic heart disease of native coronary artery without angina pectoris: Secondary | ICD-10-CM | POA: Diagnosis not present

## 2019-11-10 DIAGNOSIS — E119 Type 2 diabetes mellitus without complications: Secondary | ICD-10-CM | POA: Diagnosis not present

## 2019-11-10 DIAGNOSIS — D509 Iron deficiency anemia, unspecified: Secondary | ICD-10-CM | POA: Diagnosis not present

## 2019-11-10 DIAGNOSIS — N289 Disorder of kidney and ureter, unspecified: Secondary | ICD-10-CM | POA: Diagnosis not present

## 2019-11-10 DIAGNOSIS — Z8669 Personal history of other diseases of the nervous system and sense organs: Secondary | ICD-10-CM | POA: Diagnosis not present

## 2019-11-13 ENCOUNTER — Encounter: Payer: Self-pay | Admitting: *Deleted

## 2019-11-13 ENCOUNTER — Telehealth: Payer: Self-pay | Admitting: Gastroenterology

## 2019-11-13 ENCOUNTER — Telehealth: Payer: Self-pay

## 2019-11-13 ENCOUNTER — Other Ambulatory Visit: Payer: Self-pay

## 2019-11-13 ENCOUNTER — Telehealth: Payer: Self-pay | Admitting: *Deleted

## 2019-11-13 DIAGNOSIS — Z955 Presence of coronary angioplasty implant and graft: Secondary | ICD-10-CM

## 2019-11-13 DIAGNOSIS — K921 Melena: Secondary | ICD-10-CM

## 2019-11-13 NOTE — Telephone Encounter (Signed)
Pt called this morning stating her stools are black and tarry again. She still is not feeling well. She had a follow up with her PCP on Friday and her HgB was 9.6 Friday. She is taking her iron daily.

## 2019-11-13 NOTE — Telephone Encounter (Signed)
Pt notified to recheck CBC tomorrow, 11/14/19.

## 2019-11-13 NOTE — Telephone Encounter (Signed)
Leslie Olsen called to let us know that she would like to discharge from the program at this time.  Her bleeding issue has returned and she is also scheduled for breast biopsy on Wednesday.

## 2019-11-13 NOTE — Progress Notes (Signed)
Cardiac Individual Treatment Plan  Patient Details  Name: Leslie Olsen MRN: [id number] Date of Birth: 10-31-1950 Referring Provider:     Cardiac Rehab from 08/24/2019 in [facility] Cardiac and Pulmonary Rehab  Referring Provider  Paraschos      Initial Encounter Date:    Cardiac Rehab from 08/24/2019 in [facility] Cardiac and Pulmonary Rehab  Date  08/24/19      Visit Diagnosis: Status post coronary artery stent placement  Patient's Home Medications on Admission:  Current Outpatient Medications:  .  aspirin 81 MG tablet, Take 81 mg by mouth daily., Disp: , Rfl:  .  cholecalciferol (VITAMIN D3) 25 MCG (1000 UNIT) tablet, Take 2,000 Units by mouth daily., Disp: , Rfl:  .  clonazePAM (KLONOPIN) 0.5 MG tablet, Take 0.5 mg by mouth at bedtime. , Disp: , Rfl:  .  ferrous gluconate (FERGON) 324 MG tablet, Take 324 mg by mouth daily., Disp: , Rfl: 11 .  glucose blood (ONE TOUCH ULTRA TEST) test strip, USE ONE STRIP TO CHECK GLUCOSE TWICE DAILY, Disp: 100 each, Rfl: 12 .  levETIRAcetam (KEPPRA) 500 MG tablet, Take 500 mg by mouth daily. , Disp: , Rfl:  .  lisinopril (ZESTRIL) 10 MG tablet, Take 1 tablet (10 mg total) by mouth daily., Disp: 30 tablet, Rfl: 0 .  metFORMIN (GLUCOPHAGE) 500 MG tablet, Take 1 tablet (500 mg total) by mouth daily with breakfast. (Patient taking differently: Take 1,000 mg by mouth 2 (two) times daily with a meal. ), Disp: 90 tablet, Rfl: 1 .  pantoprazole (PROTONIX) 40 MG tablet, Take 40 mg by mouth daily as needed (Heartburn). , Disp: , Rfl: 3 .  prasugrel (EFFIENT) 10 MG TABS tablet, Take 1 tablet (10 mg total) by mouth daily., Disp: 30 tablet, Rfl: 5 .  SUMAtriptan (IMITREX) 100 MG tablet, Take 1 tablet (100 mg total) by mouth every 2 (two) hours as needed for migraine. May repeat in 2 hours if headache persists or recurs., Disp: 10 tablet, Rfl: 12 .  SYNTHROID 100 MCG tablet, Take 1 tablet (100 mcg total) by mouth daily before breakfast., Disp: 90 tablet, Rfl: 3  Past  Medical History: Past Medical History:  Diagnosis Date  . Anemia   . Cephalalgia 04/08/2015  . Chronic headaches   . COPD (chronic obstructive pulmonary disease) ([place])   . Diabetes mellitus without complication ([place])   . Fatigue due to excessive exertion 08/22/2018  . GERD (gastroesophageal reflux disease)   . Hypertension   . Hypothyroidism   . RMS[facility] spotted fever) 07/27/2016    Tobacco Use: Social History   Tobacco Use  Smoking Status Former Smoker  . Packs/day: 2.00  . Years: 45.00  . Pack years: 90.00  . Types: Cigarettes  . Quit date: 11/06/2011  . Years since quitting: 8.0  Smokeless Tobacco Never Used    Labs: Recent Review Flowsheet Data    Labs for ITP Cardiac and Pulmonary Rehab Latest Ref Rng & Units 09/01/2017 12/28/2017 06/29/2018 10/07/2018 01/06/2019   Cholestrol 100 - 199 mg/dL - 265(H) 245(H) - 266(H)   LDLCALC 0 - 99 mg/dL - 178(H) 154(H) - 184(H)   HDL >39 mg/dL - 47 42 - 43   Trlycerides 0 - 149 mg/dL - 198(H) 246(H) - 194(H)   Hemoglobin A1c <7.0 % 6.1 5.8 5.9(H) 6.0 6.1       Exercise Target Goals: Exercise Program Goal: Individual exercise prescription set using results from initial 6 min walk test and THRR while considering  patient's activity barriers and safety.   Exercise Prescription Goal: Initial exercise prescription builds to 30-45 minutes a day of aerobic activity, 2-3 days per week.  Home exercise guidelines will be given to patient during program as part of exercise prescription that the participant will acknowledge.  Activity Barriers & Risk Stratification:   6 Minute Walk: 6 Minute Walk    Row Name 08/24/19 1429         6 Minute Walk   Phase  Initial     Distance  1025 feet     Walk Time  6 minutes     # of Rest Breaks  0     MPH  1.94     METS  2.63     RPE  13     Perceived Dyspnea   1     VO2 Peak  9.22     Symptoms  No     Resting HR  104 bpm     Resting BP  128/68     Resting Oxygen Saturation   98 %      Exercise Oxygen Saturation  during 6 min walk  97 %     Max Ex. HR  131 bpm     Max Ex. BP  152/64     2 Minute Post BP  124/64        Oxygen Initial Assessment:   Oxygen Re-Evaluation:   Oxygen Discharge (Final Oxygen Re-Evaluation):   Initial Exercise Prescription: Initial Exercise Prescription - 08/24/19 1400      Date of Initial Exercise RX and Referring Provider   Date  08/24/19    Referring Provider  Paraschos      Treadmill   MPH  1.9    Grade  0.5    Minutes  15    METs  2.6      Recumbant Bike   Level  2    RPM  60    Minutes  15    METs  2.6      NuStep   Level  3    SPM  80    Minutes  15    METs  2.6      REL-XR   Level  3    Speed  50    Minutes  15    METs  2.6      T5 Nustep   Level  2    SPM  80    Minutes  15    METs  2.6      Prescription Details   Frequency (times per week)  3    Duration  Progress to 30 minutes of continuous aerobic without signs/symptoms of physical distress      Intensity   THRR 40-80% of Max Heartrate  122-141    Ratings of Perceived Exertion  11-15    Perceived Dyspnea  0-4      Resistance Training   Training Prescription  Yes    Weight  3 lb    Reps  10-15       Perform Capillary Blood Glucose checks as needed.  Exercise Prescription Changes: Exercise Prescription Changes    Row Name 08/24/19 1400 09/07/19 1200 09/21/19 1600 10/02/19 1500 10/18/19 0900     Response to Exercise   Blood Pressure (Admit)  128/68  138/64  122/60  124/70  120/82   Blood Pressure (Exercise)  152/64  172/66  156/66  154/66  148/68   Blood Pressure (Exit)  124/64  142/62  146/74  118/60  130/70   Heart Rate (Admit)  104 bpm  103 bpm  81 bpm  80 bpm  105 bpm   Heart Rate (Exercise)  131 bpm  125 bpm  126 bpm  116 bpm  134 bpm   Heart Rate (Exit)  73 bpm  97 bpm  92 bpm  91 bpm  117 bpm   Oxygen Saturation (Admit)  98 %  --  --  --  --   Oxygen Saturation (Exercise)  97 %  --  --  --  --   Rating of Perceived  Exertion (Exercise)  13  13  13  13  13    Perceived Dyspnea (Exercise)  1  --  --  --  --   Symptoms  --  none  none  none  none   Comments  --  1st day  second full day of exercise  --  --   Duration  --  --  Continue with 30 min of aerobic exercise without signs/symptoms of physical distress.  Continue with 30 min of aerobic exercise without signs/symptoms of physical distress.  Continue with 30 min of aerobic exercise without signs/symptoms of physical distress.   Intensity  --  --  THRR unchanged  THRR unchanged  THRR unchanged     Progression   Progression  --  --  Continue to progress workloads to maintain intensity without signs/symptoms of physical distress.  Continue to progress workloads to maintain intensity without signs/symptoms of physical distress.  Continue to progress workloads to maintain intensity without signs/symptoms of physical distress.   Average METs  --  --  2.76  2.76  2.56     Resistance Training   Training Prescription  --  Yes  Yes  Yes  Yes   Weight  --  3 lb  3 lb  3 lb  3 lb   Reps  --  10-15  10-15  10-15  10-15     Interval Training   Interval Training  --  --  No  No  No     Treadmill   MPH  --  --  2  --  1.7   Grade  --  --  2.5  --  0.5   Minutes  --  --  15  --  15   METs  --  --  3.22  --  2.42     NuStep   Level  --  --  1  1  3    SPM  --  --  --  80  --   Minutes  --  --  15  15  15    METs  --  --  2.3  2.4  2.7     Arm Ergometer   Level  --  1  --  --  --   Minutes  --  15  --  --  --   METs  --  2.5  --  --  --     T5 Nustep   Level  --  1  --  --  --   SPM  --  80  --  --  --   Minutes  --  15  --  --  --   METs  --  2.5  --  --  --     Biostep-RELP   Level  --  --  --  1  --   SPM  --  --  --  50  --   Minutes  --  --  --  15  --   METs  --  --  --  2  --   Row Name 10/31/19 1300             Response to Exercise   Blood Pressure (Admit)  130/80       Blood Pressure (Exercise)  150/60       Blood Pressure (Exit)   128/58       Heart Rate (Admit)  71 bpm       Heart Rate (Exercise)  140 bpm       Heart Rate (Exit)  123 bpm       Rating of Perceived Exertion (Exercise)  17       Symptoms  none       Duration  Continue with 30 min of aerobic exercise without signs/symptoms of physical distress.       Intensity  THRR unchanged         Progression   Progression  Continue to progress workloads to maintain intensity without signs/symptoms of physical distress.       Average METs  2.56         Resistance Training   Training Prescription  Yes       Weight  3 lb       Reps  10-15         Interval Training   Interval Training  No         Treadmill   MPH  1.8       Grade  0.5       Minutes  15       METs  2.42         NuStep   Level  3       Minutes  15       METs  2.7          Exercise Comments:   Exercise Goals and Review: Exercise Goals    [place] Name 08/24/19 1442             Exercise Goals   Increase Physical Activity  Yes       Intervention  Provide advice, education, support and counseling about physical activity/exercise needs.;Develop an individualized exercise prescription for aerobic and resistive training based on initial evaluation findings, risk stratification, comorbidities and participant's personal goals.       Expected Outcomes  Short Term: Attend rehab on a regular basis to increase amount of physical activity.;Long Term: Add in home exercise to make exercise part of routine and to increase amount of physical activity.;Long Term: Exercising regularly at least 3-5 days a week.       Increase Strength and Stamina  Yes       Intervention  Provide advice, education, support and counseling about physical activity/exercise needs.;Develop an individualized exercise prescription for aerobic and resistive training based on initial evaluation findings, risk stratification, comorbidities and participant's personal goals.       Expected Outcomes  Short Term: Increase workloads from  initial exercise prescription for resistance, speed, and METs.;Short Term: Perform resistance training exercises routinely during rehab and add in resistance training at home;Long Term: Improve cardiorespiratory fitness, muscular endurance and strength as measured by increased METs and functional capacity (6MWT)       Able to understand and use rate of perceived exertion (RPE) scale  Yes       Intervention  Provide education and explanation  on how to use RPE scale       Expected Outcomes  Short Term: Able to use RPE daily in rehab to express subjective intensity level;Long Term:  Able to use RPE to guide intensity level when exercising independently       Knowledge and understanding of Target Heart Rate Range (THRR)  Yes       Intervention  Provide education and explanation of THRR including how the numbers were predicted and where they are located for reference       Expected Outcomes  Short Term: Able to state/look up THRR;Short Term: Able to use daily as guideline for intensity in rehab;Long Term: Able to use THRR to govern intensity when exercising independently       Able to check pulse independently  Yes       Intervention  Provide education and demonstration on how to check pulse in carotid and radial arteries.;Review the importance of being able to check your own pulse for safety during independent exercise       Expected Outcomes  Short Term: Able to explain why pulse checking is important during independent exercise;Long Term: Able to check pulse independently and accurately       Understanding of Exercise Prescription  Yes       Intervention  Provide education, explanation, and written materials on patient's individual exercise prescription       Expected Outcomes  Short Term: Able to explain program exercise prescription;Long Term: Able to explain home exercise prescription to exercise independently          Exercise Goals Re-Evaluation : Exercise Goals Re-Evaluation    Row Name  09/04/19 1718 09/21/19 1624 09/28/19 0751 10/18/19 0936 10/30/19 1709     Exercise Goal Re-Evaluation   Exercise Goals Review  Increase Physical Activity;Increase Strength and Stamina;Able to understand and use rate of perceived exertion (RPE) scale;Knowledge and understanding of Target Heart Rate Range (THRR);Able to check pulse independently;Understanding of Exercise Prescription  Increase Physical Activity;Increase Strength and Stamina;Understanding of Exercise Prescription  Increase Physical Activity;Increase Strength and Stamina  Increase Physical Activity;Increase Strength and Stamina  Increase Physical Activity;Increase Strength and Stamina   Comments  Reviewed RPE scale, THR and program prescription with pt today.  Pt voiced understanding and was given a copy of goals to take home.  Zerina is off to a good start in rehab. She has completed her first two sessions.  We will continue to encourage her to progress.  Davionne is going to the track to walk. She is almost walking a mile two days a week. She gets out of breath a little and takes breaks as needed. She wants to keep up her stamina and keep doing cardio to keep from having blockages in her heart.  Trea missed her appointment this week.  She was not feeling well.  She hopes to be back next week.  At her last appointment, she had wanted to discharge, but changed her mind once she found out that we would be able to come more frequently she is going to stay.  She is now on level 3 on the NuStep.  Luv has been having other health issues that have been making her feel weak and making it hard for her to exercise at home. She is still attending cardiac rehab class 2x per week and is following up with her doctors this week about her new symptoms. Her shortness of breath and weakness are her main limiting factors currently.   Expected Outcomes  Short: Use RPE daily to regulate intensity. Long: Follow program prescription in THR.  Short: Attend regularly.   Long: Continue to follow program prescription.  Short: continue to walk outside of rehab. Long: join a gym or follow an exercise program for home.  Short: Return to class regularly  Long: Continue to exercise at home on off days.  Short: Follow up with doctor this week about current new symptoms. Long: Increase strength and stamina and become independent with exercise routine.      Discharge Exercise Prescription (Final Exercise Prescription Changes): Exercise Prescription Changes - 10/31/19 1300      Response to Exercise   Blood Pressure (Admit)  130/80    Blood Pressure (Exercise)  150/60    Blood Pressure (Exit)  128/58    Heart Rate (Admit)  71 bpm    Heart Rate (Exercise)  140 bpm    Heart Rate (Exit)  123 bpm    Rating of Perceived Exertion (Exercise)  17    Symptoms  none    Duration  Continue with 30 min of aerobic exercise without signs/symptoms of physical distress.    Intensity  THRR unchanged      Progression   Progression  Continue to progress workloads to maintain intensity without signs/symptoms of physical distress.    Average METs  2.56      Resistance Training   Training Prescription  Yes    Weight  3 lb    Reps  10-15      Interval Training   Interval Training  No      Treadmill   MPH  1.8    Grade  0.5    Minutes  15    METs  2.42      NuStep   Level  3    Minutes  15    METs  2.7       Nutrition:  Target Goals: Understanding of nutrition guidelines, daily intake of sodium <1513m, cholesterol <2058m calories 30% from fat and 7% or less from saturated fats, daily to have 5 or more servings of fruits and vegetables.  Biometrics: Pre Biometrics - 08/24/19 1442      Pre Biometrics   Height  5' 4.75" (1.645 m)    Weight  191 lb 9.6 oz (86.9 kg)    BMI (Calculated)  32.12    Single Leg Stand  4.78 seconds        Nutrition Therapy Plan and Nutrition Goals: Nutrition Therapy & Goals - 09/21/19 1402      Nutrition Therapy   Diet  HH, DM, Low  Na diet    Protein (specify units)  70g    Fiber  30 grams    Whole Grain Foods  3 servings    Saturated Fats  12 max. grams    Fruits and Vegetables  5 servings/day    Sodium  1.5 grams      Personal Nutrition Goals   Nutrition Goal  ST: LT: Maintaining functional years, prevent 50% blockage from going to 70% in two arteries    Comments  A1C is 6.2 - BG stable. B: oatmeal with apple L: sometimes skipped (sandwich with lunch meat or grilled on sandwich thins multigrain or peanut butter and banana sandwiches). D: vegetable plate at [employer] or canned soup or scrambled eggs with grits (pt reports not eating a lot of meat). Discussed HH and DM friendly eating. Gave suggestions such as adding protein/fat to breakfast such as nuts or seeds, eating  lunch consistenly, eat protein at dinner when not having eggs such as beans or fish like canned tuna. Pt reports not wanting to make ST goals right now due to persistent steatorrhea - pt reports that during testing her pancreas was of concern - suspect she may have some pancreatic insufficiency. Encouraged pt to contact doctor as soon as she can (pt was already trying). Will follow up.      Intervention Plan   Intervention  Prescribe, educate and counsel regarding individualized specific dietary modifications aiming towards targeted core components such as weight, hypertension, lipid management, diabetes, heart failure and other comorbidities.;Nutrition handout(s) given to patient.    Expected Outcomes  Short Term Goal: Understand basic principles of dietary content, such as calories, fat, sodium, cholesterol and nutrients.;Short Term Goal: A plan has been developed with personal nutrition goals set during dietitian appointment.;Long Term Goal: Adherence to prescribed nutrition plan.       Nutrition Assessments:   Nutrition Goals Re-Evaluation: Nutrition Goals Re-Evaluation    [place] Name 10/23/19 1713             Goals   Nutrition Goal  ST: LT:  Maintaining functional years, prevent 50% blockage from going to 70% in two arteries       Comment  Pt has appointment 2/25 regarding suspected steatorrhea and pancreatic issues. Will not make changes until pt sees doctor       Expected Outcome  ST: LT: Maintaining functional years, prevent 50% blockage from going to 70% in two arteries          Nutrition Goals Discharge (Final Nutrition Goals Re-Evaluation): Nutrition Goals Re-Evaluation - 10/23/19 1713      Goals   Nutrition Goal  ST: LT: Maintaining functional years, prevent 50% blockage from going to 70% in two arteries    Comment  Pt has appointment 2/25 regarding suspected steatorrhea and pancreatic issues. Will not make changes until pt sees doctor    Expected Outcome  ST: LT: Maintaining functional years, prevent 50% blockage from going to 70% in two arteries       Psychosocial: Target Goals: Acknowledge presence or absence of significant depression and/or stress, maximize coping skills, provide positive support system. Participant is able to verbalize types and ability to use techniques and skills needed for reducing stress and depression.   Initial Review & Psychosocial Screening: Initial Psych Review & Screening - 08/22/19 1417      Initial Review   Current issues with  Current Stress Concerns    Comments  Patient is mostly stressed due to [place]. She wears her mask and tries to protect herself.      Family Dynamics   Good Support System?  Yes    Comments  She looks to her son and grand daughter for support.      Barriers   Psychosocial barriers to participate in program  The patient should benefit from training in stress management and relaxation.      Screening Interventions   Interventions  Encouraged to exercise;Provide feedback about the scores to participant;To provide support and resources with identified psychosocial needs    Expected Outcomes  Short Term goal: Utilizing psychosocial counselor, staff and physician  to assist with identification of specific Stressors or current issues interfering with healing process. Setting desired goal for each stressor or current issue identified.;Long Term Goal: Stressors or current issues are controlled or eliminated.;Long Term goal: The participant improves quality of Life and PHQ9 Scores as seen by post scores and/or verbalization  of changes;Short Term goal: Identification and review with participant of any Quality of Life or Depression concerns found by scoring the questionnaire.       Quality of Life Scores:  Quality of Life - 08/24/19 1447      Quality of Life   Select  Quality of Life      Quality of Life Scores   Health/Function Pre  27.39 %    Socioeconomic Pre  30 %    Psych/Spiritual Pre  28.29 %    Family Pre  25.5 %    GLOBAL Pre  27.78 %      Scores of 19 and below usually indicate a poorer quality of life in these areas.  A difference of  2-3 points is a clinically meaningful difference.  A difference of 2-3 points in the total score of the Quality of Life Index has been associated with significant improvement in overall quality of life, self-image, physical symptoms, and general health in studies assessing change in quality of life.  PHQ-9: Recent Review Flowsheet Data    Depression screen [facility] 2/9 08/24/2019 10/07/2018 09/09/2017 09/03/2016 02/21/2016   Decreased Interest 0 0 0 0 0   Down, Depressed, Hopeless 0 0 0 0 0   PHQ - 2 Score 0 0 0 0 0   Altered sleeping 1 1 - 2 -   Tired, decreased energy 1 3 - 1 -   Change in appetite 2 1 - 3 -   Feeling bad or failure about yourself  0 0 - 0 -   Trouble concentrating 0 0 - 0 -   Moving slowly or fidgety/restless 0 1 - 1 -   Suicidal thoughts 0 0 - 0 -   PHQ-9 Score 4 6 - 7 -   Difficult doing work/chores Somewhat difficult Very difficult - - -     Interpretation of Total Score  Total Score Depression Severity:  1-4 = Minimal depression, 5-9 = Mild depression, 10-14 = Moderate depression, 15-19  = Moderately severe depression, 20-27 = Severe depression   Psychosocial Evaluation and Intervention: Psychosocial Evaluation - 08/22/19 1421      Psychosocial Evaluation & Interventions   Interventions  Encouraged to exercise with the program and follow exercise prescription    Comments  Patient is mostly stressed due to [place]. She wears her mask and tries to protect herself. She is ready to exercise and get herself in shape and has a positive attitude.    Expected Outcomes  Short: Attend HeartTrack stress management education to decrease stress. Long: Maintain exercise Post HeartTrack to keep stress at a minimum    Continue Psychosocial Services   Follow up required by staff       Psychosocial Re-Evaluation: Psychosocial Re-Evaluation    Row Name 09/28/19 0754 10/30/19 1715           Psychosocial Re-Evaluation   Current issues with  Current Stress Concerns;Current Sleep Concerns  Current Stress Concerns;Current Sleep Concerns      Comments  Reshanda has not been sleeping well lately. She needs her Kepra and Klonopin refilled. She is going to check her pharmacy today to see if they are filled. She is out of blood pressure medicine as well. The doctor send out new prescriptions tuesday morning and is still not in.  Larrissa reports that she does have some new stress concerns related to feeling weak and being in pain. She has F/Uvisits with several doctors this week and next to adress these concerns. She  also has a F/U appointment from a mamogram for further investigation. She reports taking all medications perscribed by her doctor and has a strong support system.      Expected Outcomes  Short: go to Pharmacy and pick up prescriptions to help sleep. Long: continue to get medications regularly to prevent sleep and stress issues.  Short: Go to doctors appointments to investigate current health concerns. Long: develop healthy sleep/ stress management routine.      Interventions  Encouraged to attend  Cardiac Rehabilitation for the exercise  Encouraged to attend Cardiac Rehabilitation for the exercise      Continue Psychosocial Services   Follow up required by staff  Follow up required by staff         Psychosocial Discharge (Final Psychosocial Re-Evaluation): Psychosocial Re-Evaluation - 10/30/19 1715      Psychosocial Re-Evaluation   Current issues with  Current Stress Concerns;Current Sleep Concerns    Comments  Xena reports that she does have some new stress concerns related to feeling weak and being in pain. She has F/Uvisits with several doctors this week and next to adress these concerns. She also has a F/U appointment from a mamogram for further investigation. She reports taking all medications perscribed by her doctor and has a strong support system.    Expected Outcomes  Short: Go to doctors appointments to investigate current health concerns. Long: develop healthy sleep/ stress management routine.    Interventions  Encouraged to attend Cardiac Rehabilitation for the exercise    Continue Psychosocial Services   Follow up required by staff       Vocational Rehabilitation: Provide vocational rehab assistance to qualifying candidates.   Vocational Rehab Evaluation & Intervention:   Education: Education Goals: Education classes will be provided on a variety of topics geared toward better understanding of heart health and risk factor modification. Participant will state understanding/return demonstration of topics presented as noted by education test scores.  Learning Barriers/Preferences: Learning Barriers/Preferences - 08/22/19 1419      Learning Barriers/Preferences   Learning Barriers  None    Learning Preferences  None       Education Topics:  AED/CPR: - Group verbal and written instruction with the use of models to demonstrate the basic use of the AED with the basic ABC's of resuscitation.   General Nutrition Guidelines/Fats and Fiber: -Group instruction  provided by verbal, written material, models and posters to present the general guidelines for heart healthy nutrition. Gives an explanation and review of dietary fats and fiber.   Controlling Sodium/Reading Food Labels: -Group verbal and written material supporting the discussion of sodium use in heart healthy nutrition. Review and explanation with models, verbal and written materials for utilization of the food label.   Exercise Physiology & General Exercise Guidelines: - Group verbal and written instruction with models to review the exercise physiology of the cardiovascular system and associated critical values. Provides general exercise guidelines with specific guidelines to those with heart or lung disease.    Cardiac Rehab from 08/24/2019 in [facility] Cardiac and Pulmonary Rehab  Date  08/24/19  Educator  AS  Instruction Review Code  1- Verbalizes Understanding      Aerobic Exercise & Resistance Training: - Gives group verbal and written instruction on the various components of exercise. Focuses on aerobic and resistive training programs and the benefits of this training and how to safely progress through these programs..   Flexibility, Balance, Mind/Body Relaxation: Provides group verbal/written instruction on the benefits of flexibility and  balance training, including mind/body exercise modes such as yoga, pilates and tai chi.  Demonstration and skill practice provided.   Stress and Anxiety: - Provides group verbal and written instruction about the health risks of elevated stress and causes of high stress.  Discuss the correlation between heart/lung disease and anxiety and treatment options. Review healthy ways to manage with stress and anxiety.   Depression: - Provides group verbal and written instruction on the correlation between heart/lung disease and depressed mood, treatment options, and the stigmas associated with seeking treatment.   Anatomy & Physiology of the Heart: -  Group verbal and written instruction and models provide basic cardiac anatomy and physiology, with the coronary electrical and arterial systems. Review of Valvular disease and Heart Failure   Cardiac Procedures: - Group verbal and written instruction to review commonly prescribed medications for heart disease. Reviews the medication, class of the drug, and side effects. Includes the steps to properly store meds and maintain the prescription regimen. (beta blockers and nitrates)   Cardiac Medications I: - Group verbal and written instruction to review commonly prescribed medications for heart disease. Reviews the medication, class of the drug, and side effects. Includes the steps to properly store meds and maintain the prescription regimen.   Cardiac Medications II: -Group verbal and written instruction to review commonly prescribed medications for heart disease. Reviews the medication, class of the drug, and side effects. (all other drug classes)    Go Sex-Intimacy & Heart Disease, Get SMART - Goal Setting: - Group verbal and written instruction through game format to discuss heart disease and the return to sexual intimacy. Provides group verbal and written material to discuss and apply goal setting through the application of the S.M.A.R.T. Method.   Other Matters of the Heart: - Provides group verbal, written materials and models to describe Stable Angina and Peripheral Artery. Includes description of the disease process and treatment options available to the cardiac patient.   Exercise & Equipment Safety: - Individual verbal instruction and demonstration of equipment use and safety with use of the equipment.   Cardiac Rehab from 08/24/2019 in [facility] Cardiac and Pulmonary Rehab  Date  08/24/19  Educator  AS  Instruction Review Code  1- Verbalizes Understanding      Infection Prevention: - Provides verbal and written material to individual with discussion of infection control  including proper hand washing and proper equipment cleaning during exercise session.   Cardiac Rehab from 08/24/2019 in [facility] Cardiac and Pulmonary Rehab  Date  08/24/19  Educator  AS  Instruction Review Code  1- Verbalizes Understanding      Falls Prevention: - Provides verbal and written material to individual with discussion of falls prevention and safety.   Cardiac Rehab from 08/24/2019 in [facility] Cardiac and Pulmonary Rehab  Date  08/24/19  Educator  AS  Instruction Review Code  1- Verbalizes Understanding      Diabetes: - Individual verbal and written instruction to review signs/symptoms of diabetes, desired ranges of glucose level fasting, after meals and with exercise. Acknowledge that pre and post exercise glucose checks will be done for 3 sessions at entry of program.   Cardiac Rehab from 08/22/2019 in [facility] Cardiac and Pulmonary Rehab  Date  08/22/19  Educator  [facility]  Instruction Review Code  1- Verbalizes Understanding      Know Your Numbers and Risk Factors: -Group verbal and written instruction about important numbers in your health.  Discussion of what are risk factors and how they play  a role in the disease process.  Review of Cholesterol, Blood Pressure, Diabetes, and BMI and the role they play in your overall health.   Sleep Hygiene: -Provides group verbal and written instruction about how sleep can affect your health.  Define sleep hygiene, discuss sleep cycles and impact of sleep habits. Review good sleep hygiene tips.    Other: -Provides group and verbal instruction on various topics (see comments)   Knowledge Questionnaire Score: Knowledge Questionnaire Score - 08/24/19 1446      Knowledge Questionnaire Score   Pre Score  24/26       Core Components/Risk Factors/Patient Goals at Admission: Personal Goals and Risk Factors at Admission - 08/24/19 1453      Core Components/Risk Factors/Patient Goals on Admission    Weight Management  Yes;Weight Loss     Intervention  Weight Management: Develop a combined nutrition and exercise program designed to reach desired caloric intake, while maintaining appropriate intake of nutrient and fiber, sodium and fats, and appropriate energy expenditure required for the weight goal.;Weight Management: Provide education and appropriate resources to help participant work on and attain dietary goals.;Weight Management/Obesity: Establish reasonable short term and long term weight goals.    Admit Weight  191 lb 9.6 oz (86.9 kg)    Goal Weight: Short Term  180 lb (81.6 kg)    Goal Weight: Long Term  170 lb (77.1 kg)    Expected Outcomes  Short Term: Continue to assess and modify interventions until short term weight is achieved;Weight Maintenance: Understanding of the daily nutrition guidelines, which includes 25-35% calories from fat, 7% or less cal from saturated fats, less than 222m cholesterol, less than 1.5gm of sodium, & 5 or more servings of fruits and vegetables daily;Long Term: Adherence to nutrition and physical activity/exercise program aimed toward attainment of established weight goal;Weight Loss: Understanding of general recommendations for a balanced deficit meal plan, which promotes 1-2 lb weight loss per week and includes a negative energy balance of [phone] kcal/d;Understanding recommendations for meals to include 15-35% energy as protein, 25-35% energy from fat, 35-60% energy from carbohydrates, less than 2064mof dietary cholesterol, 20-35 gm of total fiber daily;Understanding of distribution of calorie intake throughout the day with the consumption of 4-5 meals/snacks    Intervention  Provide education about signs/symptoms and action to take for hypo/hyperglycemia.;Provide education about proper nutrition, including hydration, and aerobic/resistive exercise prescription along with prescribed medications to achieve blood glucose in normal ranges: Fasting glucose 65-99 mg/dL    Intervention  Provide education on  lifestyle modifcations including regular physical activity/exercise, weight management, moderate sodium restriction and increased consumption of fresh fruit, vegetables, and low fat dairy, alcohol moderation, and smoking cessation.;Monitor prescription use compliance.    Expected Outcomes  Short Term: Continued assessment and intervention until BP is < 140/9033mG in hypertensive participants. < 130/58m19m in hypertensive participants with diabetes, heart failure or chronic kidney disease.;Long Term: Maintenance of blood pressure at goal levels.    Intervention  Provide education and support for participant on nutrition & aerobic/resistive exercise along with prescribed medications to achieve LDL <70mg30mL >40mg.51mExpected Outcomes  Long Term: Cholesterol controlled with medications as prescribed, with individualized exercise RX and with personalized nutrition plan. Value goals: LDL < 70mg, 84m> 40 mg.;Short Term: Participant states understanding of desired cholesterol values and is compliant with medications prescribed. Participant is following exercise prescription and nutrition guidelines.       Core Components/Risk Factors/Patient Goals Review:  Goals  and Risk Factor Review    Row Name 09/28/19 0800 10/30/19 1730           Core Components/Risk Factors/Patient Goals Review   Personal Goals Review  Weight Management/Obesity;Hypertension;Lipids;Diabetes  Weight Management/Obesity;Hypertension;Lipids;Diabetes      Review  Dejanique had her A1C checked and it was 6.6. She is working on getting her blood pressure medications refilled. She is checking her blood sugar at home regularly. She checks hre blood pressure at home and has been stable. She has not lost weight since the start of the program but the program has been moved to once every other week.  Demetri continues to monitor blood glucose and blood pressure at home. Reports that these numbers and good. She has been able to get all meds refilled  and reports taking all meds a prescribed. Her weigh has been stready and has not gained or lost anything yet.      Expected Outcomes  Short: lose weight in two weeks. Long: Lose 20 pounds.  Short: lose weight in two weeks. Long: Lose 20 pounds.         Core Components/Risk Factors/Patient Goals at Discharge (Final Review):  Goals and Risk Factor Review - 10/30/19 1730      Core Components/Risk Factors/Patient Goals Review   Personal Goals Review  Weight Management/Obesity;Hypertension;Lipids;Diabetes    Review  Aime continues to monitor blood glucose and blood pressure at home. Reports that these numbers and good. She has been able to get all meds refilled and reports taking all meds a prescribed. Her weigh has been stready and has not gained or lost anything yet.    Expected Outcomes  Short: lose weight in two weeks. Long: Lose 20 pounds.       ITP Comments: ITP Comments    Row Name 08/22/19 1427 09/04/19 1716 09/06/19 0901 09/21/19 1435 10/04/19 1422   ITP Comments  Diagnosis can be found in [facility] 07/20/2019 S/P Coronary Artery Stent.  First full day of exercise!  Patient was oriented to gym and equipment including functions, settings, policies, and procedures.  Patient's individual exercise prescription and treatment plan were reviewed.  All starting workloads were established based on the results of the 6 minute walk test done at initial orientation visit.  The plan for exercise progression was also introduced and progression will be customized based on patient's performance and goals.  30 day review competed . ITP sent to Dr Emily Filbert for review, changes as needed and ITP approval signature  New to program  Completed Initial RD Eval  30 day review completed. ITP sent to Dr. Emily Filbert, Medical Director of Cardiac and Pulmonary Rehab. Continue with ITP unless changes are made by physician.  Department operating under reduced schedule until further notice by request from hospital leadership.    [place] Name 10/09/19 1526 11/01/19 0639 11/13/19 1612       ITP Comments  Follow up call completed. Lanett has had a hard week given that she has lost 3 friends in one week. She is trying to focus on self care but it has been hard. She was happy to hear that we can start having patients 2 days a week instead of every other week. She is trying to stay active but looking forward to a more normal "set" schedule.  30 day chart review completed. ITP sent to Dr Zachery Dakins Medical Director, for review,changes as needed and signature.  Milyn called to let us know that she would like to discharge from the  program at this time.  Her bleeding issue has returned and she is also scheduled for breast biopsy on Wednesday.        Comments: Discharge ITP

## 2019-11-13 NOTE — Progress Notes (Signed)
Discharge Progress Report  Patient Details  Name: Leslie Olsen MRN: [id number] Date of Birth: 1951/02/27 Referring Provider:     Cardiac Rehab from 08/24/2019 in [facility]  Referring Provider  Paraschos       Number of Visits: 7  Reason for Discharge:  Early Exit:  Personal  Smoking History:  Social History   Tobacco Use  Smoking Status Former Smoker  . Packs/day: 2.00  . Years: 45.00  . Pack years: 90.00  . Types: Cigarettes  . Quit date: 11/06/2011  . Years since quitting: 8.0  Smokeless Tobacco Never Used    Diagnosis:  Status post coronary artery stent placement  ADL UCSD:   Initial Exercise Prescription: Initial Exercise Prescription - 08/24/19 1400      Date of Initial Exercise RX and Referring Provider   Date  08/24/19    Referring Provider  Paraschos      Treadmill   MPH  1.9    Grade  0.5    Minutes  15    METs  2.6      Recumbant Bike   Level  2    RPM  60    Minutes  15    METs  2.6      NuStep   Level  3    SPM  80    Minutes  15    METs  2.6      REL-XR   Level  3    Speed  50    Minutes  15    METs  2.6      T5 Nustep   Level  2    SPM  80    Minutes  15    METs  2.6      Prescription Details   Frequency (times per week)  3    Duration  Progress to 30 minutes of continuous aerobic without signs/symptoms of physical distress      Intensity   THRR 40-80% of Max Heartrate  122-141    Ratings of Perceived Exertion  11-15    Perceived Dyspnea  0-4      Resistance Training   Training Prescription  Yes    Weight  3 lb    Reps  10-15       Discharge Exercise Prescription (Final Exercise Prescription Changes): Exercise Prescription Changes - 10/31/19 1300      Response to Exercise   Blood Pressure (Admit)  130/80    Blood Pressure (Exercise)  150/60    Blood Pressure (Exit)  128/58    Heart Rate (Admit)  71 bpm    Heart Rate (Exercise)  140 bpm    Heart Rate (Exit)  123 bpm    Rating of  Perceived Exertion (Exercise)  17    Symptoms  none    Duration  Continue with 30 min of aerobic exercise without signs/symptoms of physical distress.    Intensity  THRR unchanged      Progression   Progression  Continue to progress workloads to maintain intensity without signs/symptoms of physical distress.    Average METs  2.56      Resistance Training   Training Prescription  Yes    Weight  3 lb    Reps  10-15      Interval Training   Interval Training  No      Treadmill   MPH  1.8    Grade  0.5    Minutes  15  METs  2.42      NuStep   Level  3    Minutes  15    METs  2.7       Functional Capacity: 6 Minute Walk    Row Name 08/24/19 1429         6 Minute Walk   Phase  Initial     Distance  1025 feet     Walk Time  6 minutes     # of Rest Breaks  0     MPH  1.94     METS  2.63     RPE  13     Perceived Dyspnea   1     VO2 Peak  9.22     Symptoms  No     Resting HR  104 bpm     Resting BP  128/68     Resting Oxygen Saturation   98 %     Exercise Oxygen Saturation  during 6 min walk  97 %     Max Ex. HR  131 bpm     Max Ex. BP  152/64     2 Minute Post BP  124/64        Psychological, QOL, Others - Outcomes: PHQ 2/9: Depression screen [facility] 2/9 08/24/2019 10/07/2018 09/09/2017 09/03/2016 02/21/2016  Decreased Interest 0 0 0 0 0  Down, Depressed, Hopeless 0 0 0 0 0  PHQ - 2 Score 0 0 0 0 0  Altered sleeping 1 1 - 2 -  Tired, decreased energy 1 3 - 1 -  Change in appetite 2 1 - 3 -  Feeling bad or failure about yourself  0 0 - 0 -  Trouble concentrating 0 0 - 0 -  Moving slowly or fidgety/restless 0 1 - 1 -  Suicidal thoughts 0 0 - 0 -  PHQ-9 Score 4 6 - 7 -  Difficult doing work/chores Somewhat difficult Very difficult - - -  Some recent data might be hidden    Quality of Life: Quality of Life - 08/24/19 1447      Quality of Life   Select  Quality of Life      Quality of Life Scores   Health/Function Pre  27.39 %    Socioeconomic Pre  30 %     Psych/Spiritual Pre  28.29 %    Family Pre  25.5 %    GLOBAL Pre  27.78 %       Personal Goals: Goals established at orientation with interventions provided to work toward goal. Personal Goals and Risk Factors at Admission - 08/24/19 1453      Core Components/Risk Factors/Patient Goals on Admission    Weight Management  Yes;Weight Loss    Intervention  Weight Management: Develop a combined nutrition and exercise program designed to reach desired caloric intake, while maintaining appropriate intake of nutrient and fiber, sodium and fats, and appropriate energy expenditure required for the weight goal.;Weight Management: Provide education and appropriate resources to help participant work on and attain dietary goals.;Weight Management/Obesity: Establish reasonable short term and long term weight goals.    Admit Weight  191 lb 9.6 oz (86.9 kg)    Goal Weight: Short Term  180 lb (81.6 kg)    Goal Weight: Long Term  170 lb (77.1 kg)    Expected Outcomes  Short Term: Continue to assess and modify interventions until short term weight is achieved;Weight Maintenance: Understanding of the daily nutrition guidelines, which includes 25-35%  calories from fat, 7% or less cal from saturated fats, less than 200mg  cholesterol, less than 1.5gm of sodium, & 5 or more servings of fruits and vegetables daily;Long Term: Adherence to nutrition and physical activity/exercise program aimed toward attainment of established weight goal;Weight Loss: Understanding of general recommendations for a balanced deficit meal plan, which promotes 1-2 lb weight loss per week and includes a negative energy balance of [phone] kcal/d;Understanding recommendations for meals to include 15-35% energy as protein, 25-35% energy from fat, 35-60% energy from carbohydrates, less than 200mg  of dietary cholesterol, 20-35 gm of total fiber daily;Understanding of distribution of calorie intake throughout the day with the consumption of 4-5  meals/snacks    Intervention  Provide education about signs/symptoms and action to take for hypo/hyperglycemia.;Provide education about proper nutrition, including hydration, and aerobic/resistive exercise prescription along with prescribed medications to achieve blood glucose in normal ranges: Fasting glucose 65-99 mg/dL    Intervention  Provide education on lifestyle modifcations including regular physical activity/exercise, weight management, moderate sodium restriction and increased consumption of fresh fruit, vegetables, and low fat dairy, alcohol moderation, and smoking cessation.;Monitor prescription use compliance.    Expected Outcomes  Short Term: Continued assessment and intervention until BP is < 140/53mm HG in hypertensive participants. < 130/38mm HG in hypertensive participants with diabetes, heart failure or chronic kidney disease.;Long Term: Maintenance of blood pressure at goal levels.    Intervention  Provide education and support for participant on nutrition & aerobic/resistive exercise along with prescribed medications to achieve LDL 70mg , HDL >40mg .    Expected Outcomes  Long Term: Cholesterol controlled with medications as prescribed, with individualized exercise RX and with personalized nutrition plan. Value goals: LDL < 70mg , HDL > 40 mg.;Short Term: Participant states understanding of desired cholesterol values and is compliant with medications prescribed. Participant is following exercise prescription and nutrition guidelines.        Personal Goals Discharge: Goals and Risk Factor Review    Row Name 09/28/19 0800 10/30/19 1730           Core Components/Risk Factors/Patient Goals Review   Personal Goals Review  Weight Management/Obesity;Hypertension;Lipids;Diabetes  Weight Management/Obesity;Hypertension;Lipids;Diabetes      Review  Leslie Olsen had her A1C checked and it was 6.6. She is working on getting her blood pressure medications refilled. She is checking her blood sugar at  home regularly. She checks hre blood pressure at home and has been stable. She has not lost weight since the start of the program but the program has been moved to once every other week.  Leslie Olsen continues to monitor blood glucose and blood pressure at home. Reports that these numbers and good. She has been able to get all meds refilled and reports taking all meds a prescribed. Her weigh has been stready and has not gained or lost anything yet.      Expected Outcomes  Short: lose weight in two weeks. Long: Lose 20 pounds.  Short: lose weight in two weeks. Long: Lose 20 pounds.         Exercise Goals and Review: Exercise Goals    Row Name 08/24/19 1442             Exercise Goals   Increase Physical Activity  Yes       Intervention  Provide advice, education, support and counseling about physical activity/exercise needs.;Develop an individualized exercise prescription for aerobic and resistive training based on initial evaluation findings, risk stratification, comorbidities and participant's personal goals.       Expected  Outcomes  Short Term: Attend rehab on a regular basis to increase amount of physical activity.;Long Term: Add in home exercise to make exercise part of routine and to increase amount of physical activity.;Long Term: Exercising regularly at least 3-5 days a week.       Increase Strength and Stamina  Yes       Intervention  Provide advice, education, support and counseling about physical activity/exercise needs.;Develop an individualized exercise prescription for aerobic and resistive training based on initial evaluation findings, risk stratification, comorbidities and participant's personal goals.       Expected Outcomes  Short Term: Increase workloads from initial exercise prescription for resistance, speed, and METs.;Short Term: Perform resistance training exercises routinely during rehab and add in resistance training at home;Long Term: Improve cardiorespiratory fitness, muscular  endurance and strength as measured by increased METs and functional capacity (6MWT)       Able to understand and use rate of perceived exertion (RPE) scale  Yes       Intervention  Provide education and explanation on how to use RPE scale       Expected Outcomes  Short Term: Able to use RPE daily in rehab to express subjective intensity level;Long Term:  Able to use RPE to guide intensity level when exercising independently       Knowledge and understanding of Target Heart Rate Range (THRR)  Yes       Intervention  Provide education and explanation of THRR including how the numbers were predicted and where they are located for reference       Expected Outcomes  Short Term: Able to state/look up THRR;Short Term: Able to use daily as guideline for intensity in rehab;Long Term: Able to use THRR to govern intensity when exercising independently       Able to check pulse independently  Yes       Intervention  Provide education and demonstration on how to check pulse in carotid and radial arteries.;Review the importance of being able to check your own pulse for safety during independent exercise       Expected Outcomes  Short Term: Able to explain why pulse checking is important during independent exercise;Long Term: Able to check pulse independently and accurately       Understanding of Exercise Prescription  Yes       Intervention  Provide education, explanation, and written materials on patient's individual exercise prescription       Expected Outcomes  Short Term: Able to explain program exercise prescription;Long Term: Able to explain home exercise prescription to exercise independently          Exercise Goals Re-Evaluation: Exercise Goals Re-Evaluation    Row Name 09/04/19 1718 09/21/19 1624 09/28/19 0751 10/18/19 0936 10/30/19 1709     Exercise Goal Re-Evaluation   Exercise Goals Review  Increase Physical Activity;Increase Strength and Stamina;Able to understand and use rate of perceived  exertion (RPE) scale;Knowledge and understanding of Target Heart Rate Range (THRR);Able to check pulse independently;Understanding of Exercise Prescription  Increase Physical Activity;Increase Strength and Stamina;Understanding of Exercise Prescription  Increase Physical Activity;Increase Strength and Stamina  Increase Physical Activity;Increase Strength and Stamina  Increase Physical Activity;Increase Strength and Stamina   Comments  Reviewed RPE scale, THR and program prescription with pt today.  Pt voiced understanding and was given a copy of goals to take home.  Leslie Olsen is off to a good start in rehab. She has completed her first two sessions.  We will continue to encourage her  to progress.  Leslie Olsen is going to the track to walk. She is almost walking a mile two days a week. She gets out of breath a little and takes breaks as needed. She wants to keep up her stamina and keep doing cardio to keep from having blockages in her heart.  Leslie Olsen missed her appointment this week.  She was not feeling well.  She hopes to be back next week.  At her last appointment, she had wanted to discharge, but changed her mind once she found out that we would be able to come more frequently she is going to stay.  She is now on level 3 on the NuStep.  Leslie Olsen has been having other health issues that have been making her feel weak and making it hard for her to exercise at home. She is still attending cardiac rehab class 2x per week and is following up with her doctors this week about her new symptoms. Her shortness of breath and weakness are her main limiting factors currently.   Expected Outcomes  Short: Use RPE daily to regulate intensity. Long: Follow program prescription in THR.  Short: Attend regularly.  Long: Continue to follow program prescription.  Short: continue to walk outside of rehab. Long: join a gym or follow an exercise program for home.  Short: Return to class regularly  Long: Continue to exercise at home on off days.   Short: Follow up with doctor this week about current new symptoms. Long: Increase strength and stamina and become independent with exercise routine.      Nutrition & Weight - Outcomes: Pre Biometrics - 08/24/19 1442      Pre Biometrics   Height  5' 4.75" (1.645 m)    Weight  191 lb 9.6 oz (86.9 kg)    BMI (Calculated)  32.12    Single Leg Stand  4.78 seconds        Nutrition: Nutrition Therapy & Goals - 09/21/19 1402      Nutrition Therapy   Diet  HH, DM, Low Na diet    Protein (specify units)  70g    Fiber  30 grams    Whole Grain Foods  3 servings    Saturated Fats  12 max. grams    Fruits and Vegetables  5 servings/day    Sodium  1.5 grams      Personal Nutrition Goals   Nutrition Goal  ST: LT: Maintaining functional years, prevent 50% blockage from going to 70% in two arteries    Comments  A1C is 6.2 - BG stable. B: oatmeal with apple L: sometimes skipped (sandwich with lunch meat or grilled on sandwich thins multigrain or peanut butter and banana sandwiches). D: vegetable plate at [employer] or canned soup or scrambled eggs with grits (pt reports not eating a lot of meat). Discussed HH and DM friendly eating. Gave suggestions such as adding protein/fat to breakfast such as nuts or seeds, eating lunch consistenly, eat protein at dinner when not having eggs such as beans or fish like canned tuna. Pt reports not wanting to make ST goals right now due to persistent steatorrhea - pt reports that during testing her pancreas was of concern - suspect she may have some pancreatic insufficiency. Encouraged pt to contact doctor as soon as she can (pt was already trying). Will follow up.      Intervention Plan   Intervention  Prescribe, educate and counsel regarding individualized specific dietary modifications aiming towards targeted core components such as weight, hypertension,  lipid management, diabetes, heart failure and other comorbidities.;Nutrition handout(s) given to patient.    Expected  Outcomes  Short Term Goal: Understand basic principles of dietary content, such as calories, fat, sodium, cholesterol and nutrients.;Short Term Goal: A plan has been developed with personal nutrition goals set during dietitian appointment.;Long Term Goal: Adherence to prescribed nutrition plan.       Nutrition Discharge:   Education Questionnaire Score: Knowledge Questionnaire Score - 08/24/19 1446      Knowledge Questionnaire Score   Pre Score  24/26       Goals reviewed with patient; copy given to patient.

## 2019-11-13 NOTE — Telephone Encounter (Signed)
Patient called back to see if Ginger had spoken with Dr Allen Norris. Please call Patient .

## 2019-11-13 NOTE — Telephone Encounter (Signed)
Let the patient know I am sorry she is having these problems but she needs to call her gastroenterologist who is Dr. Alice Reichert who did an EGD and colonoscopy on her within this last week on February 28.

## 2019-11-14 ENCOUNTER — Other Ambulatory Visit
Admission: RE | Admit: 2019-11-14 | Discharge: 2019-11-14 | Disposition: A | Discharge status: Home / Self Care | Payer: PPO | Source: Ambulatory Visit | Attending: Gastroenterology | Admitting: Gastroenterology

## 2019-11-14 ENCOUNTER — Other Ambulatory Visit: Payer: Self-pay

## 2019-11-14 DIAGNOSIS — K921 Melena: Secondary | ICD-10-CM

## 2019-11-14 LAB — CBC WITH DIFFERENTIAL/PLATELET
Abs Immature Granulocytes: 0.03 10*3/uL (ref 0.00–0.07)
Basophils Absolute: 0.1 10*3/uL (ref 0.0–0.1)
Basophils Relative: 1 %
Eosinophils Absolute: 0.3 10*3/uL (ref 0.0–0.5)
Eosinophils Relative: 4 %
HCT: 31.4 % — ABNORMAL LOW (ref 36.0–46.0)
Hemoglobin: 9.7 g/dL — ABNORMAL LOW (ref 12.0–15.0)
Immature Granulocytes: 1 %
Lymphocytes Relative: 28 %
Lymphs Abs: 1.7 10*3/uL (ref 0.7–4.0)
MCH: 29 pg (ref 26.0–34.0)
MCHC: 30.9 g/dL (ref 30.0–36.0)
MCV: 93.7 fL (ref 80.0–100.0)
Monocytes Absolute: 0.5 10*3/uL (ref 0.1–1.0)
Monocytes Relative: 9 %
Neutro Abs: 3.6 10*3/uL (ref 1.7–7.7)
Neutrophils Relative %: 57 %
Platelets: 284 10*3/uL (ref 150–400)
RBC: 3.35 MIL/uL — ABNORMAL LOW (ref 3.87–5.11)
RDW: 16.2 % — ABNORMAL HIGH (ref 11.5–15.5)
WBC: 6.2 10*3/uL (ref 4.0–10.5)
nRBC: 0 % (ref 0.0–0.2)

## 2019-11-14 NOTE — Telephone Encounter (Signed)
-----   Message from Lucilla Lame, MD sent at 11/14/2019  9:38 AM EST ----- Let the patient know that her hemoglobin continues to go up and is up from 9.2 last week to 9.7 today.

## 2019-11-14 NOTE — Telephone Encounter (Signed)
Pt notified of lab result. Pt advised to continue daily iron supplement. Will recheck in 1 week.

## 2019-11-15 ENCOUNTER — Ambulatory Visit
Admission: RE | Admit: 2019-11-15 | Discharge: 2019-11-15 | Disposition: A | Discharge status: Home / Self Care | Payer: PPO | Source: Ambulatory Visit | Attending: Internal Medicine | Admitting: Internal Medicine

## 2019-11-15 DIAGNOSIS — N6320 Unspecified lump in the left breast, unspecified quadrant: Secondary | ICD-10-CM

## 2019-11-15 DIAGNOSIS — N6323 Unspecified lump in the left breast, lower outer quadrant: Secondary | ICD-10-CM | POA: Diagnosis not present

## 2019-11-15 DIAGNOSIS — R928 Other abnormal and inconclusive findings on diagnostic imaging of breast: Secondary | ICD-10-CM | POA: Insufficient documentation

## 2019-11-15 DIAGNOSIS — Z7689 Persons encountering health services in other specified circumstances: Secondary | ICD-10-CM | POA: Diagnosis not present

## 2019-11-15 HISTORY — PX: BREAST BIOPSY: SHX20

## 2019-11-16 ENCOUNTER — Other Ambulatory Visit: Payer: Self-pay | Admitting: Oncology

## 2019-11-17 ENCOUNTER — Other Ambulatory Visit: Payer: Self-pay

## 2019-11-17 DIAGNOSIS — C50919 Malignant neoplasm of unspecified site of unspecified female breast: Secondary | ICD-10-CM

## 2019-11-17 NOTE — Progress Notes (Signed)
Received confirmation from Stacie Acres at [facility] that patient was notified of breast biopsy results.  Phoned patient to introduce navigation, and to schedule Med/Onc and Surgical Consults.  Patient previously  saw Dr. Mike Gip for hematology, but would like to remain in [place] office.  She is scheduled with Dr. Tasia Catchings for 11/22/19.  Will schedule with [facility] surgery when they open on Monday.

## 2019-11-20 NOTE — Progress Notes (Signed)
Notified patient of Surgical Consult with Dr. Lysle Pearl on #/17/21 at 1:45.  States she has good family and friend support, who will abe able to attend consults with her.

## 2019-11-21 LAB — SURGICAL PATHOLOGY

## 2019-11-22 ENCOUNTER — Inpatient Hospital Stay: Payer: PPO

## 2019-11-22 ENCOUNTER — Inpatient Hospital Stay: Payer: PPO | Attending: Oncology | Admitting: Oncology

## 2019-11-22 ENCOUNTER — Encounter: Payer: Self-pay | Admitting: Oncology

## 2019-11-22 ENCOUNTER — Other Ambulatory Visit: Payer: Self-pay

## 2019-11-22 VITALS — BP 119/76 | HR 86 | Temp 96.1°F | Resp 18 | Wt 185.3 lb

## 2019-11-22 DIAGNOSIS — Z7982 Long term (current) use of aspirin: Secondary | ICD-10-CM | POA: Insufficient documentation

## 2019-11-22 DIAGNOSIS — E875 Hyperkalemia: Secondary | ICD-10-CM | POA: Diagnosis not present

## 2019-11-22 DIAGNOSIS — I10 Essential (primary) hypertension: Secondary | ICD-10-CM | POA: Insufficient documentation

## 2019-11-22 DIAGNOSIS — E039 Hypothyroidism, unspecified: Secondary | ICD-10-CM | POA: Insufficient documentation

## 2019-11-22 DIAGNOSIS — Z801 Family history of malignant neoplasm of trachea, bronchus and lung: Secondary | ICD-10-CM | POA: Insufficient documentation

## 2019-11-22 DIAGNOSIS — E119 Type 2 diabetes mellitus without complications: Secondary | ICD-10-CM | POA: Diagnosis not present

## 2019-11-22 DIAGNOSIS — Z87891 Personal history of nicotine dependence: Secondary | ICD-10-CM | POA: Insufficient documentation

## 2019-11-22 DIAGNOSIS — C50919 Malignant neoplasm of unspecified site of unspecified female breast: Secondary | ICD-10-CM

## 2019-11-22 DIAGNOSIS — Z79899 Other long term (current) drug therapy: Secondary | ICD-10-CM | POA: Insufficient documentation

## 2019-11-22 DIAGNOSIS — Z170 Estrogen receptor positive status [ER+]: Secondary | ICD-10-CM | POA: Insufficient documentation

## 2019-11-22 DIAGNOSIS — Z955 Presence of coronary angioplasty implant and graft: Secondary | ICD-10-CM | POA: Diagnosis not present

## 2019-11-22 DIAGNOSIS — K219 Gastro-esophageal reflux disease without esophagitis: Secondary | ICD-10-CM | POA: Insufficient documentation

## 2019-11-22 DIAGNOSIS — Z8349 Family history of other endocrine, nutritional and metabolic diseases: Secondary | ICD-10-CM | POA: Insufficient documentation

## 2019-11-22 DIAGNOSIS — Z7984 Long term (current) use of oral hypoglycemic drugs: Secondary | ICD-10-CM | POA: Diagnosis not present

## 2019-11-22 DIAGNOSIS — C50912 Malignant neoplasm of unspecified site of left female breast: Secondary | ICD-10-CM | POA: Insufficient documentation

## 2019-11-22 DIAGNOSIS — Z602 Problems related to living alone: Secondary | ICD-10-CM | POA: Diagnosis not present

## 2019-11-22 DIAGNOSIS — J449 Chronic obstructive pulmonary disease, unspecified: Secondary | ICD-10-CM | POA: Diagnosis not present

## 2019-11-22 DIAGNOSIS — D649 Anemia, unspecified: Secondary | ICD-10-CM | POA: Diagnosis not present

## 2019-11-22 DIAGNOSIS — C50512 Malignant neoplasm of lower-outer quadrant of left female breast: Secondary | ICD-10-CM | POA: Diagnosis not present

## 2019-11-22 LAB — COMPREHENSIVE METABOLIC PANEL
ALT: 16 U/L (ref 0–44)
AST: 20 U/L (ref 15–41)
Albumin: 4.6 g/dL (ref 3.5–5.0)
Alkaline Phosphatase: 81 U/L (ref 38–126)
Anion gap: 9 (ref 5–15)
BUN: 23 mg/dL (ref 8–23)
CO2: 25 mmol/L (ref 22–32)
Calcium: 9.8 mg/dL (ref 8.9–10.3)
Chloride: 103 mmol/L (ref 98–111)
Creatinine, Ser: 1.21 mg/dL — ABNORMAL HIGH (ref 0.44–1.00)
GFR calc Af Amer: 52 mL/min — ABNORMAL LOW (ref >60)
GFR calc non Af Amer: 45 mL/min — ABNORMAL LOW (ref >60)
Glucose, Bld: 130 mg/dL — ABNORMAL HIGH (ref 70–99)
Potassium: 5.4 mmol/L — ABNORMAL HIGH (ref 3.5–5.1)
Sodium: 137 mmol/L (ref 135–145)
Total Bilirubin: 0.5 mg/dL (ref 0.3–1.2)
Total Protein: 8.2 g/dL — ABNORMAL HIGH (ref 6.5–8.1)

## 2019-11-22 LAB — CBC WITH DIFFERENTIAL/PLATELET
Abs Immature Granulocytes: 0.02 10*3/uL (ref 0.00–0.07)
Basophils Absolute: 0.1 10*3/uL (ref 0.0–0.1)
Basophils Relative: 1 %
Eosinophils Absolute: 0.2 10*3/uL (ref 0.0–0.5)
Eosinophils Relative: 3 %
HCT: 32.5 % — ABNORMAL LOW (ref 36.0–46.0)
Hemoglobin: 10.5 g/dL — ABNORMAL LOW (ref 12.0–15.0)
Immature Granulocytes: 0 %
Lymphocytes Relative: 22 %
Lymphs Abs: 1.2 10*3/uL (ref 0.7–4.0)
MCH: 29.7 pg (ref 26.0–34.0)
MCHC: 32.3 g/dL (ref 30.0–36.0)
MCV: 91.8 fL (ref 80.0–100.0)
Monocytes Absolute: 0.4 10*3/uL (ref 0.1–1.0)
Monocytes Relative: 6 %
Neutro Abs: 3.8 10*3/uL (ref 1.7–7.7)
Neutrophils Relative %: 68 %
Platelets: 332 10*3/uL (ref 150–400)
RBC: 3.54 MIL/uL — ABNORMAL LOW (ref 3.87–5.11)
RDW: 17 % — ABNORMAL HIGH (ref 11.5–15.5)
WBC: 5.7 10*3/uL (ref 4.0–10.5)
nRBC: 0 % (ref 0.0–0.2)

## 2019-11-22 LAB — IRON AND TIBC
Iron: 104 ug/dL (ref 28–170)
Saturation Ratios: 32 % — ABNORMAL HIGH (ref 10.4–31.8)
TIBC: 328 ug/dL (ref 250–450)
UIBC: 224 ug/dL

## 2019-11-22 LAB — FERRITIN: Ferritin: 57 ng/mL (ref 11–307)

## 2019-11-22 NOTE — Progress Notes (Signed)
Patient here for initial evaluation invasive carcinoma of breast.  She did have recent admission for GI bleed requiring blood transfusion and iron infusion.

## 2019-11-22 NOTE — Progress Notes (Signed)
Hematology/Oncology Consult note [facility] Telephone:([phone] [phone]   Patient Care Team: [place] as PCP - General Lucilla Lame, MD as Consulting Physician (Gastroenterology) Anabel Bene, MD as Referring Physician (Neurology) Lequita Asal, MD as Referring Physician (Hematology and Oncology) Theodore Demark, RN as Oncology Nurse Navigator  REFERRING PROVIDER:  CHIEF COMPLAINTS/REASON FOR VISIT:  Evaluation of breast cancer  HISTORY OF PRESENTING ILLNESS:  Leslie Olsen is a  69 y.o.  female with PMH listed below who was referred to me for evaluation of breast cancer  Patient had screening mammogram on 10/24/2019 and mammogram showed a possible asymmetry in the left which warrants further evaluation.  Diagnostic left breast and ultrasound was done 32/2021 and showed suspicious mass 7x4x11m with possible associated distortion in the left breast at 5o'clock.  No evidence of left axillary lymphadenopathy. Left breast mass was biopsied and pathology showed: Invasive mammary carcinoma no special type, DCIS present, intermediate grade.  ER 91-100%, PR 81-90%, HER2 negative  Nipple discharge: Denies Menarche 69years old Age of first live birth was [birth year]Hysterectomy at 69years old  Family history: Maternal aunt with breast cancer at age of 525  Brother has lung cancer OCP use: denies.  Estrogen and progesterone therapy: 2 years of hormone replacement in her late 436s   History of radiation to chest: denies.  She has a history of radioactive iodine ablation Previous breast surgery: History of breast augmentation History of left breast biopsy in 1970 She denies any nipple discharge.   Review of Systems  Constitutional: Negative for appetite change, chills, fatigue and fever.  HENT:   Negative for hearing loss and voice change.   Eyes: Negative for eye problems.  Respiratory: Negative for chest tightness and cough.     Cardiovascular: Negative for chest pain.  Gastrointestinal: Negative for abdominal distention, abdominal pain and blood in stool.  Endocrine: Negative for hot flashes.  Genitourinary: Negative for difficulty urinating and frequency.   Musculoskeletal: Negative for arthralgias.  Skin: Negative for itching and rash.  Neurological: Negative for extremity weakness.  Hematological: Negative for adenopathy.  Psychiatric/Behavioral: Negative for confusion.    MEDICAL HISTORY:  Past Medical History:  Diagnosis Date  . Anemia   . Cephalalgia 04/08/2015  . Chronic headaches   . COPD (chronic obstructive pulmonary disease) ([place]   . Diabetes mellitus without complication ([place]   . Fatigue due to excessive exertion 08/22/2018  . GERD (gastroesophageal reflux disease)   . Hypertension   . Hypothyroidism   . [facility] 07/27/2016    SURGICAL HISTORY: Past Surgical History:  Procedure Laterality Date  . ABDOMINAL HYSTERECTOMY    . BACK SURGERY    . BREAST BIOPSY Left 11/15/2019   uKoreabx , heart marker   , path pending  . BREAST EXCISIONAL BIOPSY Left 1970's  . CHOLECYSTECTOMY    . COLONOSCOPY N/A 11/05/2019   Procedure: COLONOSCOPY;  Surgeon: Toledo, TBenay Pike MD;  Location: [facility] ENDOSCOPY;  Service: Gastroenterology;  Laterality: N/A;  . CORONARY STENT INTERVENTION N/A 07/31/2019   Procedure: CORONARY STENT INTERVENTION;  Surgeon: PIsaias Cowman MD;  [place];  Service: Cardiovascular;  Laterality: N/A;  RCA  . CORONARY STENT PLACEMENT    . ESOPHAGOGASTRODUODENOSCOPY N/A 11/05/2019   Procedure: ESOPHAGOGASTRODUODENOSCOPY (EGD);  Surgeon: Toledo, TBenay Pike MD;  Location: [facility] ENDOSCOPY;  Service: Gastroenterology;  Laterality: N/A;  . ESOPHAGOGASTRODUODENOSCOPY (EGD) WITH PROPOFOL N/A 05/26/2016   Procedure: ESOPHAGOGASTRODUODENOSCOPY (EGD)  WITH PROPOFOL;  Surgeon: Lucilla Lame, MD;  Location: [facility] ENDOSCOPY;  Service: Endoscopy;   Laterality: N/A;  . GIVENS CAPSULE STUDY N/A 05/05/2016   Procedure: GIVENS CAPSULE STUDY;  Surgeon: Lucilla Lame, MD;  Location: [facility] ENDOSCOPY;  Service: Endoscopy;  Laterality: N/A;  . LEFT HEART CATH AND CORONARY ANGIOGRAPHY Left 07/31/2019   Procedure: LEFT HEART CATH AND CORONARY ANGIOGRAPHY;  Surgeon: Isaias Cowman, MD;  Location: [place] [facility] LAB;  Service: Cardiovascular;  Laterality: Left;  . REDUCTION MAMMAPLASTY    . REPLACEMENT TOTAL KNEE    . SHOULDER SURGERY    . TIBIA FRACTURE SURGERY      SOCIAL HISTORY: Social History   Socioeconomic History  . Marital status: Divorced    Spouse name: Not on file  . Number of children: 1  . Years of education: 34  . Highest education level: 12th grade  Occupational History  . Occupation: retired   Tobacco Use  . Smoking status: Former Smoker    Packs/day: 2.00    Years: 45.00    Pack years: 90.00    Types: Cigarettes    Quit date: 11/06/2011    Years since quitting: 8.0  . Smokeless tobacco: Never Used  Substance and Sexual Activity  . Alcohol use: No  . Drug use: No  . Sexual activity: Never  Other Topics Concern  . Not on file  Social History Narrative   Lives alone.  She has one grown son.   She works as a Retail buyer at [employer]   Highest level of education was high school   Social Determinants of Health   Financial Resource Strain:   . Difficulty of Paying Living Expenses:   Food Insecurity:   . Worried About Charity fundraiser in the Last Year:   . Arboriculturist in the Last Year:   Transportation Needs:   . Film/video editor (Medical):   Marland Kitchen Lack of Transportation (Non-Medical):   Physical Activity:   . Days of Exercise per Week:   . Minutes of Exercise per Session:   Stress:   . Feeling of Stress :   Social Connections:   . Frequency of Communication with Friends and Family:   . Frequency of Social Gatherings with Friends and Family:   . Attends Religious Services:   . Active  Member of Clubs or Organizations:   . Attends Archivist Meetings:   Marland Kitchen Marital Status:   Intimate Partner Violence:   . Fear of Current or Ex-Partner:   . Emotionally Abused:   Marland Kitchen Physically Abused:   . Sexually Abused:     FAMILY HISTORY: Family History  Problem Relation Age of Onset  . Lung cancer Maternal Aunt   . Breast cancer Maternal Aunt 57  . Other Mother        Killed in car accident, 53  . Other Father        Killed in car accident, 49  . Thyroid disease Brother   . Thyroid disease Sister     ALLERGIES:  has No Known Allergies.  MEDICATIONS:  Current Outpatient Medications  Medication Sig Dispense Refill  . aspirin 81 MG tablet Take 81 mg by mouth daily.    . cholecalciferol (VITAMIN D3) 25 MCG (1000 UNIT) tablet Take 2,000 Units by mouth daily.    . clonazePAM (KLONOPIN) 0.5 MG tablet Take 0.5 mg by mouth at bedtime.     . ferrous gluconate (FERGON) 324 MG tablet Take 324 mg by  mouth daily.  11  . glucose blood (ONE TOUCH ULTRA TEST) test strip USE ONE STRIP TO CHECK GLUCOSE TWICE DAILY 100 each 12  . levETIRAcetam (KEPPRA) 500 MG tablet Take 500 mg by mouth daily.     Marland Kitchen lisinopril (ZESTRIL) 10 MG tablet Take 1 tablet (10 mg total) by mouth daily. 30 tablet 0  . metFORMIN (GLUCOPHAGE) 500 MG tablet Take 1 tablet (500 mg total) by mouth daily with breakfast. (Patient taking differently: Take 1,000 mg by mouth 2 (two) times daily with a meal. ) 90 tablet 1  . pantoprazole (PROTONIX) 40 MG tablet Take 40 mg by mouth daily as needed (Heartburn).   3  . prasugrel (EFFIENT) 10 MG TABS tablet Take 1 tablet (10 mg total) by mouth daily. 30 tablet 5  . SUMAtriptan (IMITREX) 100 MG tablet Take 1 tablet (100 mg total) by mouth every 2 (two) hours as needed for migraine. May repeat in 2 hours if headache persists or recurs. 10 tablet 12  . SYNTHROID 100 MCG tablet Take 1 tablet (100 mcg total) by mouth daily before breakfast. 90 tablet 3   No current  facility-administered medications for this visit.     PHYSICAL EXAMINATION: ECOG PERFORMANCE STATUS: 0 - Asymptomatic Vitals:   11/22/19 1101  BP: 119/76  Pulse: 86  Resp: 18  Temp: (!) 96.1 F (35.6 C)   Filed Weights   11/22/19 1101  Weight: 185 lb 4.8 oz (84.1 kg)    Physical Exam Constitutional:      General: She is not in acute distress. HENT:     Head: Normocephalic and atraumatic.  Eyes:     General: No scleral icterus. Cardiovascular:     Rate and Rhythm: Normal rate and regular rhythm.     Heart sounds: Normal heart sounds.  Pulmonary:     Effort: Pulmonary effort is normal. No respiratory distress.     Breath sounds: No wheezing.  Abdominal:     General: Bowel sounds are normal. There is no distension.     Palpations: Abdomen is soft.  Musculoskeletal:        General: No deformity. Normal range of motion.     Cervical back: Normal range of motion and neck supple.  Skin:    General: Skin is warm and dry.     Findings: No erythema or rash.  Neurological:     Mental Status: She is alert and oriented to person, place, and time. Mental status is at baseline.     Cranial Nerves: No cranial nerve deficit.     Coordination: Coordination normal.  Psychiatric:        Mood and Affect: Mood normal.   Breast exam was performed in seated and lying down position. Left breast status post biopsy, + bruising, no palpable mass. No palpable mass in right breast.  No palpable lymphadenopathy.     LABORATORY DATA:  I have reviewed the data as listed Lab Results  Component Value Date   WBC 6.2 11/14/2019   HGB 9.7 (L) 11/14/2019   HCT 31.4 (L) 11/14/2019   MCV 93.7 11/14/2019   PLT 284 11/14/2019   Recent Labs    01/06/19 1352 08/01/19 0517 11/03/19 1221 11/03/19 1221 11/04/19 0546 11/05/19 0513 11/06/19 0415  NA 137   < > 135   < > 137 137 140  K 4.8   < > 4.9   < > 4.5 4.2 4.0  CL 97   < > 107   < >  110 110 111  CO2 24   < > 22   < > 20* 22 23    GLUCOSE 109*   < > 135*   < > 109* 108* 130*  BUN 18   < > 29*   < > _0 CREATININE 1.14*   < > 1.17*   < > 1.07* 1.07* 1.13*  CALCIUM 10.1   < > 9.1   < > 8.7* 8.8* 9.0  GFRNONAA 49*   < > 47*   < > 53* 53* 49*  GFRAA 57*   < > 55*   < > >60 >60 57*  PROT 7.9  --  7.4  --   --   --   --   ALBUMIN 4.8  --  4.3  --   --   --   --   AST 22  --  23  --   --   --   --   ALT 25  --  15  --   --   --   --   ALKPHOS 99  --  75  --   --   --   --   BILITOT 0.3  --  0.5  --   --   --   --    < > = values in this interval not displayed.   Iron/TIBC/Ferritin/ %Sat    Component Value Date/Time   IRON 17 (L) 11/02/2019 1008   IRON 48 12/13/2014 1455   TIBC 353 11/02/2019 1008   TIBC 378 12/13/2014 1455   FERRITIN 15 11/02/2019 1008   FERRITIN 11 12/13/2014 1455   IRONPCTSAT 5 (LL) 11/02/2019 1008   IRONPCTSAT 12.7 12/13/2014 1455        ASSESSMENT & PLAN:  1. Invasive carcinoma of breast ([place])   2. Normocytic anemia   3. Hyperkalemia   Cancer Staging Invasive carcinoma of breast ([place]) Staging form: Breast, AJCC 8th Edition - Clinical stage from 11/24/2019: Stage IA (cT1b, cN0, cM0, G1, ER+, PR+, HER2-) - Signed by Earlie Server, MD on 11/24/2019   Images were independently reviewed by me and discussed with the patient. Pathology results were discussed with patient. Clinical stage IA ER/PR positive, HER-2 negative left breast cancer diagnosis was discussed with patient. I recommend lumpectomy with sentinel lymph node biopsy. Adjuvant therapy depends on final pathology.  If lymph node is confirmed to be pathologically negative, I will obtain Oncotype DX.  We briefly discussed about role of adjuvant radiation and adjuvant endocrine therapy. Obtain CBC, CMP, CA 27.29, CA 15.3.  Labs are reviewed.  Patient has normocytic anemia with hemoglobin of 10.5, MCV 91.8. Chemistry labs showed high potassium level at 5.4, creatinine 1.21. Patient is on lisinopril 10 mg daily.  I will ask RN  navigator to advise patient to communicate with primary care provider for repeat in the monitoring of potassium level.  Orders Placed This Encounter  Procedures  . CBC with Differential/Platelet    Standing Status:   Future    Number of Occurrences:   1    Standing Expiration Date:   11/21/2020  . Comprehensive metabolic panel    Standing Status:   Future    Number of Occurrences:   1    Standing Expiration Date:   11/21/2020  . Ferritin    Standing Status:   Future    Number of Occurrences:   1    Standing Expiration Date:   11/21/2020  . Iron and TIBC  Standing Status:   Future    Number of Occurrences:   1    Standing Expiration Date:   11/21/2020  . Cancer antigen 15-3    Standing Status:   Future    Number of Occurrences:   1    Standing Expiration Date:   11/21/2020  . Cancer antigen 27.29    Standing Status:   Future    Number of Occurrences:   1    Standing Expiration Date:   11/21/2020    All questions were answered. The patient knows to call the clinic with any problems questions or concerns.  Return of visit: 2 to 3 weeks after surgery. Thank you for this kind referral and the opportunity to participate in the care of this patient. A copy of today's note is routed to referring provider   Earlie Server, MD, PhD Hematology Oncology [facility] Pager- [phone] 11/22/2019

## 2019-11-23 LAB — CANCER ANTIGEN 15-3: CA 15-3: 33.7 U/mL — ABNORMAL HIGH (ref 0.0–25.0)

## 2019-11-23 LAB — CANCER ANTIGEN 27.29: CA 27.29: 43.8 U/mL — ABNORMAL HIGH (ref 0.0–38.6)

## 2019-11-24 ENCOUNTER — Other Ambulatory Visit: Payer: Self-pay | Admitting: Surgery

## 2019-11-24 ENCOUNTER — Telehealth: Payer: Self-pay

## 2019-11-24 DIAGNOSIS — C50512 Malignant neoplasm of lower-outer quadrant of left female breast: Secondary | ICD-10-CM

## 2019-11-24 DIAGNOSIS — C50919 Malignant neoplasm of unspecified site of unspecified female breast: Secondary | ICD-10-CM | POA: Insufficient documentation

## 2019-11-24 DIAGNOSIS — Z170 Estrogen receptor positive status [ER+]: Secondary | ICD-10-CM

## 2019-11-24 NOTE — Telephone Encounter (Signed)
Message received from Al Pimple:    Leslie Olsen just called me.  I will let her know to talk to her primary about potassium level.  She had questions about CA27.29. I explained reason for elevation. She asked if you think she will need iron infusion prior to surgery. She is scheduled for surgery on 12/14/19. What should I tell her about the iron, and do you want to schedule her follow-up appointment?  Thanks,  Webb Silversmith

## 2019-11-27 ENCOUNTER — Other Ambulatory Visit: Payer: Self-pay

## 2019-11-27 ENCOUNTER — Ambulatory Visit
Admission: RE | Admit: 2019-11-27 | Discharge: 2019-11-27 | Disposition: A | Discharge status: Home / Self Care | Payer: PPO | Source: Ambulatory Visit | Attending: Surgery | Admitting: Surgery

## 2019-11-27 ENCOUNTER — Ambulatory Visit
Admission: RE | Admit: 2019-11-27 | Discharge: 2019-11-27 | Disposition: A | Discharge status: Home / Self Care | Payer: PPO | Source: Ambulatory Visit

## 2019-11-27 ENCOUNTER — Other Ambulatory Visit: Payer: Self-pay | Admitting: Surgery

## 2019-11-27 DIAGNOSIS — C50512 Malignant neoplasm of lower-outer quadrant of left female breast: Secondary | ICD-10-CM

## 2019-11-27 DIAGNOSIS — Z170 Estrogen receptor positive status [ER+]: Secondary | ICD-10-CM

## 2019-11-27 DIAGNOSIS — R928 Other abnormal and inconclusive findings on diagnostic imaging of breast: Secondary | ICD-10-CM | POA: Diagnosis not present

## 2019-11-28 DIAGNOSIS — R399 Unspecified symptoms and signs involving the genitourinary system: Secondary | ICD-10-CM | POA: Diagnosis not present

## 2019-12-01 MED ORDER — LEVETIRACETAM 500 MG TABLET
ORAL_TABLET | Freq: Two times a day (BID) | ORAL | 1 refills | 90.00000 days | Status: CP
Start: 2019-12-01 — End: ?

## 2019-12-01 NOTE — Telephone Encounter (Signed)
Done.. Pt  has been scheduled as resquest. Message left NEW appt will be mail out.

## 2019-12-01 NOTE — Telephone Encounter (Signed)
She needs to follow up with me 2-3 weeks after her surgery

## 2019-12-01 NOTE — Telephone Encounter (Signed)
LC, please schedule MD f/u as requested.

## 2019-12-06 ENCOUNTER — Other Ambulatory Visit
Admission: RE | Admit: 2019-12-06 | Discharge: 2019-12-06 | Disposition: A | Discharge status: Home / Self Care | Payer: PPO | Source: Ambulatory Visit | Attending: Surgery | Admitting: Surgery

## 2019-12-06 ENCOUNTER — Other Ambulatory Visit: Payer: Self-pay

## 2019-12-06 ENCOUNTER — Ambulatory Visit: Payer: Self-pay | Admitting: Surgery

## 2019-12-06 DIAGNOSIS — Z20822 Contact with and (suspected) exposure to covid-19: Secondary | ICD-10-CM | POA: Insufficient documentation

## 2019-12-06 DIAGNOSIS — Z01818 Encounter for other preprocedural examination: Secondary | ICD-10-CM | POA: Insufficient documentation

## 2019-12-06 HISTORY — DX: Sleep apnea, unspecified: G47.30

## 2019-12-06 NOTE — Pre-Procedure Instructions (Signed)
Pre-Admit Testing Provider Notification Note  Provider Notified: Dr.William Kephart  Notification Mode: Telephone  Reason: Patient had Stent place in 07/2019 was told by her cardiologist to  Continue dual antiplatelet therapy uninterrupted for 1 year   Response: [place] with anesthesia as long as Psychologist, sport and exercise ok for procedure.   Additional Information: Sent FYI to Dr. Lysle Pearl via secure chat  Signed: Rebecca Eaton, RN

## 2019-12-06 NOTE — Pre-Procedure Instructions (Signed)
Pre-Admit Testing Provider Notification Note  Provider Notified: Dr. Lysle Pearl  Notification Mode: Secure chat   Reason: just FYI this patient had stent placed 11/20 and was advised by her cariodlogist to continue with blood thinners, surgery scheduled 4/8. Anesthesia is ok on their part.    Response: thanks. will reach out to cards to see if we can bridge with lovenox or heparin in preparation for procedure   Additional Information: none  Signed: Rebecca Eaton, RN

## 2019-12-06 NOTE — H&P (Signed)
Subjective:   CC: Malignant neoplasm of lower-outer quadrant of left breast of female, estrogen receptor positive (CMS-HCC) [C50.512, Z17.0] HPI: Leslie Olsen is a 69 y.o. female who was referred by Velna Ochs, MD for evaluation of above. Change was noted on last screening mammogram. Patient does routinely do self breast exams. Patient has not noted a change on breast exam. Age of menarche was 64. Hysterectomy _0 . Patient reports hormonal therapy, for post menopausal symptoms in 56s. Patient is G3P1. Age of first live birth was 33. Patient did not breast feed. Patient denies nipple discharge. Patient reports previous breast biopsy. Patient denies a personal history of breast cancer.  Past Medical History: has a past medical history of Chronic obstructive pulmonary disease (CMS-HCC) (11/01/2012), COPD (chronic obstructive pulmonary disease) (CMS-HCC), Emphysema of lung (CMS-HCC), Essential hypertension, benign (07/11/2015), Gastroparesis (02/16/2017), Hyperlipidemia (07/11/2015), Hypothyroidism, unspecified (02/21/2016), Iron deficiency anemia (10/22/2015), Irritable bowel syndrome with diarrhea (03/06/2018), Sleep apnea, Type 2 diabetes mellitus without complications (CMS-HCC) (04/08/2015), and Voice resonance disorder (08/25/2013).  Past Surgical History: has a past surgical history that includes Replacement total knee (Right).  Family History: family history includes Lung cancer in her brother; Myocardial Infarction (Heart attack) in her sister.aunt with breast cancer at 60  Social History: reports that she quit smoking about 7 years ago. She has never used smokeless tobacco. She reports that she does not drink alcohol or use drugs.  Current Medications: has a current medication list which includes the following prescription(s): pantoprazole, aspirin, blood glucose diagnostic, clonazepam, ferrous gluconate, levetiracetam, lisinopril, metformin, prasugrel, sumatriptan, and  synthroid.  Allergies:  Allergies as of 11/22/2019  . (No Known Allergies)   ROS:  A 15 point review of systems was performed and was negative except as noted in HPI  Objective:    BP 120/78  Pulse 81  Ht 167.6 cm (_1 )  Wt 85.3 kg (188 lb)  BMI 30.34 kg/m   Constitutional : alert, appears stated age, cooperative and no distress  Lymphatics/Throat: no asymmetry, masses, or scars  Respiratory: clear to auscultation bilaterally  Cardiovascular: regular rate and rhythm  Gastrointestinal: soft, non-tender; bowel sounds normal; no masses, no organomegaly.  Musculoskeletal: Steady gait and movement  Skin: Cool and moist  Psychiatric: Normal affect, non-agitated, not confused  Breast: Chaperone present for exam. breasts appear normal, no suspicious masses, no skin or nipple changes or axillary nodes.   LABS:  Reason for Addendum #1: Breast Biomarker Results  Specimen Submitted: A. Breast, left 5:00 4cmfn  Clinical History: Heart-shaped biopsy clip within 0.7 cm mass, 5:00 left breast, ultrasound biopsy  DIAGNOSIS: A. LEFT BREAST, 5:00 4CMFN; ULTRASOUND-GUIDED BIOPSY: - INVASIVE MAMMARY CARCINOMA, NO SPECIAL TYPE.  Size of invasive carcinoma: 5 mm in this sample Histologic grade of invasive carcinoma: Grade 1           Glandular/tubular differentiation score: 1           Nuclear pleomorphism score: 2           Mitotic rate score: 1           Total score: 4 Ductal carcinoma in situ: Present, intermediate grade, cribriform Lymphovascular invasion: Not identified  Comment: Immunohistochemistry will be performed on block A1, with reflex to [place] for HER2 2+. The results will be reported in an addendum. The definitive grade will be assigned on the excisional specimen.  GROSS DESCRIPTION: A. Labeled: Left breast 5:00 4 cm from nipple Received: Formalin Time/date in fixative: Collected and placed into formalin at  1:00 PM  on 11/15/2019. Cold ischemic time: Less than 1 minute Total fixation time: 7.5 hours Core pieces: 4 Size: Ranging from 1.2-1.7 cm in length and 0.2 cm in diameter Description: Received are needle core biopsy fragments of tan-yellow, fibrofatty tissue Ink color: Green Entirely submitted in cassettes 1-2.  Final Diagnosis performed by Betsy Pries, MD.  Electronically signed 11/16/2019 2:03:50PM The electronic signature indicates that the named Attending Pathologist has evaluated the specimen Technical component performed at [facility], [address] [place], [place] [address] Lab: [phone] Dir: Rush Farmer, MD, MMM Professional component performed at [facility], [facility], [place], [place], [place] [address] Lab: [phone] Dir: Dellia Nims. Rubinas, MD  ADDENDUM: BREAST BIOMARKER TESTS Estrogen Receptor (ER) Status: POSITIVE           Percentage of cells with nuclear positivity: 91-100%           Average intensity of staining: Strong  Progesterone Receptor (PgR) Status: POSITIVE           Percentage of cells with nuclear positivity: 81 to 90%           Average intensity of staining: Moderate  HER2 (by immunohistochemistry): Negative (score 1+)  Cold Ischemia and Fixation Times: Meet requirements specified in latest version of the ASCO/CAP guidelines Testing Performed on Block Number(s): A1  METHODS Fixative: Formalin Estrogen Receptor: FDA cleared ([place]) Primary Antibody: SP1 Progesterone Receptor: FDA cleared ([place]) Primary Antibody: 1E2 HER2 (by IHC): FDA cleared ([place]) Primary Antibody: 4B5 ([facility]) Immunohistochemistry controls worked appropriately. Slides were prepared by [facility], [place], [place], and interpreted by Dr. Betsy Pries.  This test was developed and its performance characteristics determined by [facility]. It has not been cleared or approved by the [place] Food and  Drug Administration. The FDA does not require this test to go through premarket FDA review. This test is used for clinical purposes. It should not be regarded as investigational or for research. This laboratory is certified under the Clinical Laboratory Improvement Amendments (CLIA) as qualified to perform high complexity clinical laboratory testing.  Addendum #1 performed by Betsy Pries, MD.  Electronically signed 11/21/2019 4:35:37PM The electronic signature indicates that the named Attending Pathologist has evaluated the specimen Technical component performed at [facility], [address], [place], [place] [address] Lab: [phone] Dir: Rush Farmer, MD, MMM Professional component performed at [facility], [facility], [place], [place],  [address] Lab: [phone] Dir: Dellia Nims. Rubinas, MD    RADS: CLINICAL DATA: Screening recall for a left breast asymmetry. The patient has history of reduction mammoplasties.  EXAM: DIGITAL DIAGNOSTIC LEFT MAMMOGRAM WITH CAD AND TOMO  ULTRASOUND LEFT BREAST  COMPARISON: Previous exam(s).  ACR Breast Density Category b: There are scattered areas of fibroglandular density.  FINDINGS: Spot compression tomosynthesis images through the lower outer anterior left breast demonstrates a small mass with some possible associated distortion.  Mammographic images were processed with CAD.  Ultrasound of the left breast at 5 o'clock, 4 cm from the nipple demonstrates an irregular hypoechoic mass measuring 7 x 4 x 6 mm. Ultrasound of the left axilla demonstrates multiple normal-appearing lymph nodes.  IMPRESSION: 1. There is a suspicious mass with possible associated distortion in the left breast at 5 o'clock.  2. No evidence of left axillary lymphadenopathy.  RECOMMENDATION: Ultrasound guided biopsy is recommended.  I have discussed the findings and recommendations with the patient. If applicable, a reminder  letter will be sent to the patient regarding the next appointment.  BI-RADS  CATEGORY 4: Suspicious.  Electronically Signed By: Ammie Ferrier M.D. On: 11/07/2019 16:03 Addendum by Margarette Canada, MD on 11/16/2019 5:52 PM ADDENDUM REPORT: 11/16/2019 15:35  ADDENDUM: Pathology revealed GRADE I INVASIVE MAMMARY CARCINOMA, INTERMEDIATE GRADE DUCTAL CARCINOMA IN SITU of the LEFT breast. This was found to be concordant by Dr. Hassan Rowan.  Pathology results were discussed with the patient by telephone. The patient reported doing well after the biopsy with tenderness at the site. Post biopsy instructions and care were reviewed and questions were answered. The patient was encouraged to call [facility] for any additional concerns.  Referrals for Medical Oncologist and Surgical will be arranged by Nurse Navigators at [facility].  Pathology results reported by Stacie Acres RN on 11/16/2019.  Electronically Signed By: Margarette Canada M.D. On: 11/16/2019 15:35  Other Result Information  This result has an attachment that is not available.  Result Narrative  CLINICAL DATA: 69 year old female for tissue sampling of 0.7 cm LOWER OUTER LEFT breast mass.  EXAM: ULTRASOUND GUIDED LEFT BREAST CORE NEEDLE BIOPSY  COMPARISON: Previous exam(s).  PROCEDURE: I met with the patient and we discussed the procedure of ultrasound-guided biopsy, including benefits and alternatives. We discussed the high likelihood of a successful procedure. We discussed the risks of the procedure, including infection, bleeding, tissue injury, clip migration, and inadequate sampling. Informed written consent was given. The usual time-out protocol was performed immediately prior to the procedure.  Lesion quadrant: LOWER OUTER LEFT breast  Using sterile technique and 1% Lidocaine as local anesthetic, under direct ultrasound  visualization, a 14 gauge spring-loaded device was used to perform biopsy of the 0.7 cm mass at the 5 o'clock position of the LEFT breast 4 cm from the nipple using a MEDIAL approach. At the conclusion of the procedure HEART tissue marker clip was deployed into the biopsy cavity. Follow up 2 view mammogram was performed and dictated separately.  IMPRESSION: Ultrasound guided biopsy of 0.7 cm LOWER OUTER LEFT breast mass. No apparent complications.  Electronically Signed: By: Margarette Canada M.D. On: 11/15/2019 13:37   Assessment:   Malignant neoplasm of lower-outer quadrant of left breast of female, estrogen receptor positive (CMS-HCC) [C50.512, Z17.0]   CAD, S/P dual drug -eluting stent  Plan:    1. Malignant neoplasm of lower-outer quadrant of left breast of female, estrogen receptor positive (CMS-HCC) [C50.512, Z17.0] Discussed the risk of surgery including recurrence, chronic pain, post-op infxn, poor/delayed wound healing, poor cosmesis, seroma, hematoma formation, and possible re-operation to address said risks. The risks of general anesthetic, if used, includes MI, CVA, sudden death or even reaction to anesthetic medications also discussed.  Typical post-op recovery time and possbility of activity restrictions were also discussed. Alternatives include continued observation. Benefits include possible symptom relief, pathologic evaluation, and/or curative excision.   The patient verbalized understanding and all questions were answered to the patient's satisfaction.  2. Patient has elected to proceed with surgical treatment. Procedure will be scheduled. Obtain clearance from cards to stop anticoag. Written consent was obtained. We discussed increased risk of bleeding due to perioperative anticoag use.

## 2019-12-06 NOTE — Patient Instructions (Addendum)
Your procedure is scheduled on: Thursday December 14, 2019 Report to Day Surgery. To find out your arrival time please call [phone] between 1PM - 3PM on Wednesday December 13, 2019.  Remember: Instructions that are not followed completely may result in serious medical risk,  up to and including death, or upon the discretion of your surgeon and anesthesiologist your  surgery may need to be rescheduled.     _X__ 1. Do not eat food after midnight the night before your procedure.                 No gum chewing or hard candies. You may drink clear liquids up to 2 hours                 before you are scheduled to arrive for your surgery- DO not drink clear                 liquids within 2 hours of the start of your surgery.                 Clear Liquids include:  water, apple juice without pulp, clear Gatorade, G2 or                  Gatorade Zero (avoid Red/Purple/Blue), Black Coffee or Tea (Do not add                 anything to coffee or tea).  __X__2.  On the morning of surgery brush your teeth with toothpaste and water, you                may rinse your mouth with mouthwash if you wish.  Do not swallow any toothpaste of mouthwash.     _X__ 3.  No Alcohol for 24 hours before or after surgery.   _X__ 4.  Do Not Smoke or use e-cigarettes For 24 Hours Prior to Your Surgery.                 Do not use any chewable tobacco products for at least 6 hours prior to                 surgery.  __x__ 5.  Notify your doctor if there is any change in your medical condition      (cold, fever, infections).     Do not wear jewelry, make-up, hairpins, clips or nail polish. Do not wear lotions, powders, or perfumes. You may wear deodorant. Do not shave 48 hours prior to surgery. Men may shave face and neck. Do not bring valuables to the hospital.    [facility] is not responsible for any belongings or valuables.  Contacts, dentures or bridgework may not be worn into surgery. Leave  your suitcase in the car. After surgery it may be brought to your room. For patients admitted to the hospital, discharge time is determined by your treatment team.   Patients discharged the day of surgery will not be allowed to drive home.   Make arrangements for someone to be with you for the first 24 hours of your Same Day Discharge.   __x__ Take these medicines the morning of surgery with A SIP OF WATER:    1. pantoprazole (PROTONIX) 40 MG  2. SYNTHROID 100 MCG   __x__ Use CHG Soap (or wipes) as directed  __x__ Stop metformin 2 days prior to surgery (last dose April 5)    __x__Continue with your aspirin 81 mg and prasugrel (  EFFIENT) 10 MG as instructed by your cardiologist unless hear otherwise  __x__ Stop Anti-inflammatories such as ibuprofen, Aleve, naproxen and or BC powders    __x__ Stop supplements until after surgery.    __x__ Do not start any herbal supplements before your surgery.

## 2019-12-08 ENCOUNTER — Encounter: Payer: Self-pay | Admitting: Oncology

## 2019-12-08 ENCOUNTER — Inpatient Hospital Stay: Payer: PPO | Attending: Oncology | Admitting: Oncology

## 2019-12-08 ENCOUNTER — Telehealth: Payer: Self-pay

## 2019-12-08 ENCOUNTER — Other Ambulatory Visit: Payer: Self-pay

## 2019-12-08 VITALS — BP 130/77 | HR 89 | Temp 96.7°F | Resp 18 | Wt 184.8 lb

## 2019-12-08 DIAGNOSIS — Z803 Family history of malignant neoplasm of breast: Secondary | ICD-10-CM | POA: Insufficient documentation

## 2019-12-08 DIAGNOSIS — C50312 Malignant neoplasm of lower-inner quadrant of left female breast: Secondary | ICD-10-CM | POA: Insufficient documentation

## 2019-12-08 DIAGNOSIS — Z79811 Long term (current) use of aromatase inhibitors: Secondary | ICD-10-CM | POA: Diagnosis not present

## 2019-12-08 DIAGNOSIS — Z801 Family history of malignant neoplasm of trachea, bronchus and lung: Secondary | ICD-10-CM | POA: Insufficient documentation

## 2019-12-08 DIAGNOSIS — Z8349 Family history of other endocrine, nutritional and metabolic diseases: Secondary | ICD-10-CM | POA: Diagnosis not present

## 2019-12-08 DIAGNOSIS — K219 Gastro-esophageal reflux disease without esophagitis: Secondary | ICD-10-CM | POA: Insufficient documentation

## 2019-12-08 DIAGNOSIS — I10 Essential (primary) hypertension: Secondary | ICD-10-CM | POA: Diagnosis not present

## 2019-12-08 DIAGNOSIS — Z7984 Long term (current) use of oral hypoglycemic drugs: Secondary | ICD-10-CM | POA: Diagnosis not present

## 2019-12-08 DIAGNOSIS — Z79899 Other long term (current) drug therapy: Secondary | ICD-10-CM | POA: Insufficient documentation

## 2019-12-08 DIAGNOSIS — Z7982 Long term (current) use of aspirin: Secondary | ICD-10-CM | POA: Insufficient documentation

## 2019-12-08 DIAGNOSIS — G4730 Sleep apnea, unspecified: Secondary | ICD-10-CM | POA: Diagnosis not present

## 2019-12-08 DIAGNOSIS — C50919 Malignant neoplasm of unspecified site of unspecified female breast: Secondary | ICD-10-CM

## 2019-12-08 DIAGNOSIS — E119 Type 2 diabetes mellitus without complications: Secondary | ICD-10-CM | POA: Insufficient documentation

## 2019-12-08 DIAGNOSIS — Q2733 Arteriovenous malformation of digestive system vessel: Secondary | ICD-10-CM | POA: Insufficient documentation

## 2019-12-08 DIAGNOSIS — J449 Chronic obstructive pulmonary disease, unspecified: Secondary | ICD-10-CM | POA: Diagnosis not present

## 2019-12-08 DIAGNOSIS — Z170 Estrogen receptor positive status [ER+]: Secondary | ICD-10-CM | POA: Insufficient documentation

## 2019-12-08 DIAGNOSIS — Z87891 Personal history of nicotine dependence: Secondary | ICD-10-CM | POA: Diagnosis not present

## 2019-12-08 DIAGNOSIS — Z90710 Acquired absence of both cervix and uterus: Secondary | ICD-10-CM | POA: Diagnosis not present

## 2019-12-08 DIAGNOSIS — D500 Iron deficiency anemia secondary to blood loss (chronic): Secondary | ICD-10-CM | POA: Diagnosis not present

## 2019-12-08 DIAGNOSIS — E039 Hypothyroidism, unspecified: Secondary | ICD-10-CM | POA: Insufficient documentation

## 2019-12-08 MED ORDER — ANASTROZOLE 1 MG PO TABS: 1.0000 mg | ORAL_TABLET | Freq: Every day | ORAL | 0 refills | Status: DC

## 2019-12-08 NOTE — Telephone Encounter (Signed)
-----   Message from Earlie Server, MD sent at 12/07/2019  7:56 PM EDT ----- Regarding: FW: hormone therapy Please schedule her to see me for discussion of endocrinetherapy.  Thank you. MD only  Talbert Cage  ----- Message ----- From: Benjamine Sprague, DO Sent: 12/07/2019   8:50 AM EDT To: Earlie Server, MD Subject: hormone therapy                                Dr. Tasia Catchings,  Cards said we cannot stop dual antiplatelet therapy for this patient for her upcoming surgery.  She had drug eluting stent placed on 04/2019.  Is there a chance we can potentially bridge her with hormonal therapy until after august to proceed with surgery?  I know it is not ideal, but trying to deal with bleeding complications can result in multiple surgical interventions, so I am not comfortable proceeding with surgery while she is actively taking her meds.  Thanks, sakai

## 2019-12-08 NOTE — Progress Notes (Signed)
Patient here to discuss endocrine therapy (hormone therapy) with upcoming surgery.

## 2019-12-08 NOTE — Progress Notes (Signed)
Hematology/Oncology Consult note Leslie Olsen [phone] [phone]   Patient Care Team: [place] as PCP - General Lucilla Lame, MD as Consulting Physician (Gastroenterology) Anabel Bene, MD as Referring Physician (Neurology) Theodore Demark, RN as Oncology Nurse Navigator Earlie Server, MD as Consulting Physician (Oncology)  REFERRING PROVIDER:  CHIEF COMPLAINTS/REASON FOR VISIT:  Evaluation of breast cancer  HISTORY OF PRESENTING ILLNESS:  Leslie Olsen is a  69 y.o.  female with PMH listed below who was referred to me for evaluation of breast cancer  Patient had screening mammogram on 10/24/2019 and mammogram showed a possible asymmetry in the left which warrants further evaluation.  Diagnostic left breast and ultrasound was done 32/2021 and showed suspicious mass 7x4x49m with possible associated distortion in the left breast at 5o'clock.  No evidence of left axillary lymphadenopathy. Left breast mass was biopsied and pathology showed: Invasive mammary carcinoma no special type, DCIS present, intermediate grade.  ER 91-100%, PR 81-90%, HER2 negative  Nipple discharge: Denies Menarche 69years old Age of first live birth was 89Hysterectomy at 69years old  Family history: Maternal aunt with breast cancer at age of 542  Brother has lung cancer OCP use: denies.  Estrogen and progesterone therapy: 2 years of hormone replacement in her late 432s   History of radiation to chest: denies.  She has a history of radioactive iodine ablation Previous breast surgery: History of breast augmentation History of left breast biopsy in 1970 She denies any nipple discharge.   INTERVAL HISTORY Leslie VILLESCASis a 69y.o. female who has above history reviewed by me today presents for follow up visit for management of breast cancer Problems and complaints are listed below: Patient has established care with Surgery Dr.Sakai. She is on  dural antiplatelet therapy due to drug eluting stent that was placed in November 2020. Cardiology does not clear patient to be off antiplatelet therapy due to risk of thrombosis. Patient presents to discuss management plan.     Review of Systems  Constitutional: Negative for appetite change, chills, fatigue and fever.  HENT:   Negative for hearing loss and voice change.   Eyes: Negative for eye problems.  Respiratory: Negative for chest tightness and cough.   Cardiovascular: Negative for chest pain.  Gastrointestinal: Negative for abdominal distention, abdominal pain and blood in stool.  Endocrine: Negative for hot flashes.  Genitourinary: Negative for difficulty urinating and frequency.   Musculoskeletal: Negative for arthralgias.  Skin: Negative for itching and rash.  Neurological: Negative for extremity weakness.  Hematological: Negative for adenopathy.  Psychiatric/Behavioral: Negative for confusion.    MEDICAL HISTORY:  Past Medical History:  Diagnosis Date  . Anemia   . Cancer ([facility]    Breast cancer   . Cephalalgia 04/08/2015  . Chronic headaches   . COPD (chronic obstructive pulmonary disease) ([facility]   . Diabetes mellitus without complication ([place]   . Fatigue due to excessive exertion 08/22/2018  . GERD (gastroesophageal reflux disease)   . Hypertension   . Hypothyroidism   . [facility] 07/[facility]  . Sleep apnea     SURGICAL HISTORY: Past Surgical History:  Procedure Laterality Date  . ABDOMINAL HYSTERECTOMY    . BACK SURGERY    . BREAST BIOPSY Left 11/15/2019   uKoreabx , heart marker   , path pending  . BREAST EXCISIONAL BIOPSY Left 1970's  . CHOLECYSTECTOMY    . COLONOSCOPY N/A 11/05/2019   Procedure: COLONOSCOPY;  Surgeon: Toledo, Benay Pike, MD;  Location: [facility] ENDOSCOPY;  Service: Gastroenterology;  Laterality: N/A;  . CORONARY STENT INTERVENTION N/A 07/31/2019   Procedure: CORONARY STENT INTERVENTION;  Surgeon: Isaias Cowman,  MD;  Location: [place] [facility];  Service: Cardiovascular;  Laterality: N/A;  RCA  . CORONARY STENT PLACEMENT    . ESOPHAGOGASTRODUODENOSCOPY N/A 11/05/2019   Procedure: ESOPHAGOGASTRODUODENOSCOPY (EGD);  Surgeon: Toledo, Benay Pike, MD;  Location: [facility]COPY;  Service: Gastroenterology;  Laterality: N/A;  . ESOPHAGOGASTRODUODENOSCOPY (EGD) WITH PROPOFOL N/A 05/26/2016   Procedure: ESOPHAGOGASTRODUODENOSCOPY (EGD) WITH PROPOFOL;  Surgeon: Lucilla Lame, MD;  Location: [facility] ENDOSCOPY;  Service: Endoscopy;  Laterality: N/A;  . GIVENS CAPSULE STUDY N/A 05/05/2016   Procedure: GIVENS CAPSULE STUDY;  Surgeon: Lucilla Lame, MD;  Location: [facility]OS[facility];  Service: Endoscopy;  Laterality: N/A;  . JOINT REPLACEMENT    . LEFT HEART CATH AND CORONARY ANGIOGRAPHY Left 07/31/2019   Procedure: LEFT HEART CATH AND CORONARY ANGIOGRAPHY;  Surgeon: Isaias Cowman, MD;  Location: [place] [facility];  Service: Cardiovascular;  Laterality: Left;  . REDUCTION MAMMAPLASTY    . REPLACEMENT TOTAL KNEE    . SHOULDER SURGERY    . TIBIA FRACTURE SURGERY      SOCIAL HISTORY: Social History   Socioeconomic History  . Marital status: Divorced    Spouse name: Not on file  . Number of children: 1  . Years of education: 61  . Highest education level: 12th grade  Occupational History  . Occupation: retired   Tobacco Use  . Smoking status: Former Smoker    Packs/day: 2.00    Years: 45.00    Pack years: 90.00    Types: Cigarettes    Quit date: 11/06/2011    Years since quitting: 8.0  . Smokeless tobacco: Never Used  Substance and Sexual Activity  . Alcohol use: No  . Drug use: No  . Sexual activity: Never  Other Topics Concern  . Not on file  Social History Narrative   Lives alone.  She has one grown son.   She works as a Retail buyer at [employer]   Highest level of education was high school   Social Determinants of Health   Financial Resource Strain:   . Difficulty of Paying Living Expenses:    Food Insecurity:   . Worried About Charity fundraiser in the Last Year:   . Arboriculturist in the Last Year:   Transportation Needs:   . Film/video editor (Medical):   Marland Kitchen Lack of Transportation (Non-Medical):   Physical Activity:   . Days of Exercise per Week:   . Minutes of Exercise per Session:   Stress:   . Feeling of Stress :   Social Connections:   . Frequency of Communication with Friends and Family:   . Frequency of Social Gatherings with Friends and Family:   . Attends Religious Services:   . Active Member of Clubs or Organizations:   . Attends Archivist Meetings:   Marland Kitchen Marital Status:   Intimate Partner Violence:   . Fear of Current or Ex-Partner:   . Emotionally Abused:   Marland Kitchen Physically Abused:   . Sexually Abused:     FAMILY HISTORY: Family History  Problem Relation Age of Onset  . Lung cancer Maternal Aunt   . Breast cancer Maternal Aunt 57  . Other Mother        Killed in car accident, 88  . Other Father  Killed in car accident, 45  . Thyroid disease Brother   . Thyroid disease Sister     ALLERGIES:  has No Known Allergies.  MEDICATIONS:  Current Outpatient Medications  Medication Sig Dispense Refill  . aspirin 81 MG tablet Take 81 mg by mouth daily.    . Cholecalciferol (VITAMIN D) 50 MCG (2000 UT) CAPS Take 2,000 Units by mouth daily.     . clonazePAM (KLONOPIN) 0.5 MG tablet Take 0.5 mg by mouth 2 (two) times daily.     . ferrous gluconate (FERGON) 324 MG tablet Take 324 mg by mouth daily.  11  . glucose blood (ONE TOUCH ULTRA TEST) test strip USE ONE STRIP TO CHECK GLUCOSE TWICE DAILY 100 each 12  . levETIRAcetam (KEPPRA) 500 MG tablet Take 500 mg by mouth daily.     Marland Kitchen lisinopril (ZESTRIL) 10 MG tablet Take 1 tablet (10 mg total) by mouth daily. 30 tablet 0  . metFORMIN (GLUCOPHAGE) 500 MG tablet Take 1 tablet (500 mg total) by mouth daily with breakfast. (Patient taking differently: Take 1,000 mg by mouth 2 (two) times daily  with a meal. ) 90 tablet 1  . pantoprazole (PROTONIX) 40 MG tablet Take 40 mg by mouth daily as needed (Heartburn).   3  . prasugrel (EFFIENT) 10 MG TABS tablet Take 1 tablet (10 mg total) by mouth daily. 30 tablet 5  . SUMAtriptan (IMITREX) 100 MG tablet Take 1 tablet (100 mg total) by mouth every 2 (two) hours as needed for migraine. May repeat in 2 hours if headache persists or recurs. 10 tablet 12  . SYNTHROID 100 MCG tablet Take 1 tablet (100 mcg total) by mouth daily before breakfast. 90 tablet 3  . anastrozole (ARIMIDEX) 1 MG tablet Take 1 tablet (1 mg total) by mouth daily. 30 tablet 0   No current facility-administered medications for this visit.     PHYSICAL EXAMINATION: ECOG PERFORMANCE STATUS: 0 - Asymptomatic Vitals:   12/08/19 1344  BP: 130/77  Pulse: 89  Resp: 18  Temp: (!) 96.7 F (35.9 C)   Filed Weights   12/08/19 1344  Weight: 184 lb 12.8 oz (83.8 kg)   Physical Exam  Constitutional: She is oriented to person, place, and time. No distress.  HENT:  Head: Normocephalic and atraumatic.  Nose: Nose normal.  Mouth/Throat: Oropharynx is clear and moist. No oropharyngeal exudate.  Eyes: Pupils are equal, round, and reactive to light. EOM are normal. No scleral icterus.  Cardiovascular: Normal rate and regular rhythm.  No murmur heard. Pulmonary/Chest: Effort normal. No respiratory distress. She has no rales. She exhibits no tenderness.  Abdominal: Soft. She exhibits no distension. There is no abdominal tenderness.  Musculoskeletal:        General: No edema. Normal range of motion.     Cervical back: Normal range of motion and neck supple.  Neurological: She is alert and oriented to person, place, and time. No cranial nerve deficit. She exhibits normal muscle tone. Coordination normal.  Skin: Skin is warm and dry. She is not diaphoretic. No erythema.  Psychiatric: Affect normal.      LABORATORY DATA:  I have reviewed the data as listed Lab Results    Component Value Date   WBC 5.7 11/22/2019   HGB 10.5 (L) 11/22/2019   HCT 32.5 (L) 11/22/2019   MCV 91.8 11/22/2019   PLT 332 11/22/2019   Recent Labs    01/06/19 1352 08/01/19 0517 11/03/19 1221 11/04/19 0546 11/05/19 0513 11/06/19 0415 11/22/19  1158  NA 137   < > 135   < > 137 140 137  K 4.8   < > 4.9   < > 4.2 4.0 5.4*  CL 97   < > 107   < > 110 111 103  CO2 24   < > 22   < > 22 23 25   GLUCOSE 109*   < > 135*   < > 108* 130* 130*  BUN 18   < > 29*   < > 10 8 23   CREATININE 1.14*   < > 1.17*   < > 1.07* 1.13* 1.21*  CALCIUM 10.1   < > 9.1   < > 8.8* 9.0 9.8  GFRNONAA 49*   < > 47*   < > 53* 49* 45*  GFRAA 57*   < > 55*   < > >60 57* 52*  PROT 7.9  --  7.4  --   --   --  8.2*  ALBUMIN 4.8  --  4.3  --   --   --  4.6  AST 22  --  23  --   --   --  20  ALT 25  --  15  --   --   --  16  ALKPHOS 99  --  75  --   --   --  81  BILITOT 0.3  --  0.5  --   --   --  0.5   < > = values in this interval not displayed.   Iron/TIBC/Ferritin/ %Sat    Component Value Date/Time   IRON 104 11/22/2019 1158   IRON 17 (L) 11/02/2019 1008   IRON 48 12/13/2014 1455   TIBC 328 11/22/2019 1158   TIBC 353 11/02/2019 1008   TIBC 378 12/13/2014 1455   FERRITIN 57 11/22/2019 1158   FERRITIN 15 11/02/2019 1008   FERRITIN 11 12/13/2014 1455   IRONPCTSAT 32 (H) 11/22/2019 1158   IRONPCTSAT 5 (LL) 11/02/2019 1008   IRONPCTSAT 12.7 12/13/2014 1455        ASSESSMENT & PLAN:  1. Invasive carcinoma of breast ([place])   2. Use of anastrozole (Arimidex)   Cancer Staging Invasive carcinoma of breast ([place]) Staging form: Breast, AJCC 8th Edition - Clinical stage from 11/24/2019: Stage IA (cT1b, cN0, cM0, G1, ER+, PR+, HER2-) - Signed by Earlie Server, MD on 11/24/2019   Images were independently reviewed by me and discussed with the patient. Pathology results were discussed with patient. Clinical stage IA ER/PR positive, HER-2 negative left breast cancer diagnosis was discussed with patient. I  discussed with Dr.Sakai, her surgery needs to be delayed due to not able to be off dual platelet therapy due to drug eluting stent.  I recommend patient to start neoadjuvant endocrinology therapy with arimidex while waiting surgery.  Rationale of using aromatase inhibitor -Arimidex  discussed with patient.  Side effects of Arimidex including but not limited to hot flush, joint pain, fatigue, mood swing, osteoporosis discussed with patient. Patient voices understanding and willing to proceed.   Will also obtain Oncotype Dx.  Baseline elevated CA 27.29, and CA15.3, monitor while on neoadjuvant therapy.    Orders Placed This Encounter  Procedures  . CBC with Differential/Platelet    Standing Status:   Future    Standing Expiration Date:   12/07/2020  . Comprehensive metabolic panel    Standing Status:   Future    Standing Expiration Date:   12/07/2020    All questions  were answered. The patient knows to call the clinic with any problems questions or concerns.  Return of visit: keep her appoint at the end of April . Thank you for this kind referral and the opportunity to participate in the care of this patient. A copy of today's note is routed to referring provider   Earlie Server, MD, PhD Hematology Oncology [facility] Pager- [phone] 12/08/2019

## 2019-12-08 NOTE — Telephone Encounter (Signed)
Pt notified. Accepts appt for today at 1:45 pm.

## 2019-12-11 ENCOUNTER — Telehealth: Payer: Self-pay | Admitting: Surgery

## 2019-12-11 DIAGNOSIS — Z170 Estrogen receptor positive status [ER+]: Secondary | ICD-10-CM | POA: Diagnosis not present

## 2019-12-11 DIAGNOSIS — I2510 Atherosclerotic heart disease of native coronary artery without angina pectoris: Secondary | ICD-10-CM | POA: Diagnosis not present

## 2019-12-11 DIAGNOSIS — C50512 Malignant neoplasm of lower-outer quadrant of left female breast: Secondary | ICD-10-CM | POA: Diagnosis not present

## 2019-12-11 NOTE — Telephone Encounter (Signed)
Call patient back regarding her antiplatelet therapy and timing of surgery.  I explained to her that my recommendation at this point is to postpone the surgery while bridging with antihormonal therapy to minimize the risk of perioperative complications.  I explained to her that the small but present risk of stent failure by withholding antiplatelet therapy for the procedure confers in my opinion a greater risk to her overall health, then postponing the surgery until November.  Patient explained how she was very upset that she was not involved in the discussion regarding her surgery.  I explained to her that I was in the process of obtaining all the necessary information and official recommendations from all involved parties prior to presenting her with a new recommendation.  I explained to her that I typically obtain all information needed in order to have the most up-to-date and informative discussion regarding plan of care.  Patient was still not content with this explanation, and stated she felt like cardiologist needed to call her first to discuss the upcoming surgery.  Again, I explained to her that the ultimate decision regarding proceeding or not proceeding with the surgery is between surgeon and patient.  She then expressed her concerns regarding a "wire" that was placed in her breast for the upcoming surgery.  I confirmed with a chart review that she did not have a wire placed but had the RF localizer placed in preparation for the surgery.  I explained to her that this RF localizer can stay in place until it is needed, and that there is no risks or side effects with this localizer.  I reiterated that we are available to discuss any additional questions or concerns she may have.  I I also explicitly explained to her that I do not have any opposition for her to seek a second opinion regarding this matter, especially now that her confidence with the treatment team has been placed in doubt.  Patient  stated that she needs some time to think things over at this moment.  For now we will postpone her surgery and wait to hear back from her.  She did state that she has picked up the prescription for her antihormonal therapy at this time.

## 2019-12-12 ENCOUNTER — Other Ambulatory Visit: Admission: RE | Admit: 2019-12-12 | Payer: PPO | Source: Ambulatory Visit

## 2019-12-13 DIAGNOSIS — C50112 Malignant neoplasm of central portion of left female breast: Principal | ICD-10-CM

## 2019-12-13 DIAGNOSIS — C50919 Malignant neoplasm of unspecified site of unspecified female breast: Principal | ICD-10-CM

## 2019-12-13 DIAGNOSIS — R197 Diarrhea, unspecified: Secondary | ICD-10-CM | POA: Diagnosis not present

## 2019-12-13 NOTE — Progress Notes (Addendum)
Phoned patient to follow-up on surgery plan.  Patient states she is moving her care to [facility].

## 2019-12-14 ENCOUNTER — Ambulatory Visit: Admission: RE | Admit: 2019-12-14 | Payer: PPO | Source: Home / Self Care | Admitting: Surgery

## 2019-12-14 ENCOUNTER — Ambulatory Visit
Admission: RE | Admit: 2019-12-14 | Discharge: 2019-12-14 | Disposition: A | Discharge status: Home / Self Care | Payer: PPO | Source: Ambulatory Visit | Attending: Surgery | Admitting: Surgery

## 2019-12-14 ENCOUNTER — Encounter: Admission: RE | Payer: Self-pay | Source: Home / Self Care

## 2019-12-14 ENCOUNTER — Ambulatory Visit: Payer: PPO

## 2019-12-14 DIAGNOSIS — Z170 Estrogen receptor positive status [ER+]: Secondary | ICD-10-CM | POA: Insufficient documentation

## 2019-12-14 DIAGNOSIS — C50512 Malignant neoplasm of lower-outer quadrant of left female breast: Secondary | ICD-10-CM

## 2019-12-14 SURGERY — PARTIAL MASTECTOMY WITH NEEDLE LOCALIZATION AND AXILLARY SENTINEL LYMPH NODE BX
Anesthesia: General | Site: Breast | Laterality: Left

## 2019-12-15 ENCOUNTER — Encounter: Admit: 2019-12-15 | Discharge: 2019-12-15 | Payer: PRIVATE HEALTH INSURANCE

## 2019-12-15 DIAGNOSIS — C50919 Malignant neoplasm of unspecified site of unspecified female breast: Principal | ICD-10-CM

## 2019-12-15 DIAGNOSIS — C50512 Malignant neoplasm of lower-outer quadrant of left female breast: Secondary | ICD-10-CM | POA: Diagnosis not present

## 2019-12-19 ENCOUNTER — Telehealth: Payer: Self-pay

## 2019-12-19 MED ORDER — CLONAZEPAM 0.5 MG TABLET
ORAL_TABLET | 0 refills | 0 days | Status: CP
Start: 2019-12-19 — End: ?

## 2019-12-19 NOTE — Telephone Encounter (Signed)
Please cancel post surgery f/u appts on 4/27.Pt did not have surgery as scheduled. Per phone note from Al Pimple, RN: patient moving her care to [facility]. MD aware.

## 2019-12-21 NOTE — Telephone Encounter (Signed)
Patient notified of appt cancellation. Per Al Pimple, RN, pt has appt at [facility] next week.

## 2019-12-21 NOTE — Progress Notes (Signed)
Phoned patient to confirm follow-up at [facility].  States she has appointment with 5 doctors next week.  Encouraged her to let me know how she is doing.

## 2019-12-25 ENCOUNTER — Ambulatory Visit: Admit: 2019-12-25 | Discharge: 2019-12-26 | Payer: PRIVATE HEALTH INSURANCE

## 2019-12-25 ENCOUNTER — Institutional Professional Consult (permissible substitution): Admit: 2019-12-25 | Discharge: 2019-12-26 | Payer: PRIVATE HEALTH INSURANCE | Attending: Neurology | Primary: Neurology

## 2019-12-25 DIAGNOSIS — C50912 Malignant neoplasm of unspecified site of left female breast: Principal | ICD-10-CM

## 2019-12-25 DIAGNOSIS — R928 Other abnormal and inconclusive findings on diagnostic imaging of breast: Secondary | ICD-10-CM | POA: Diagnosis not present

## 2019-12-25 MED ORDER — LEVETIRACETAM 500 MG TABLET
ORAL_TABLET | Freq: Two times a day (BID) | ORAL | 1 refills | 90.00000 days | Status: CP
Start: 2019-12-25 — End: ?

## 2019-12-27 ENCOUNTER — Ambulatory Visit: Admit: 2019-12-27 | Discharge: 2019-12-28 | Payer: PRIVATE HEALTH INSURANCE

## 2019-12-27 DIAGNOSIS — Z170 Estrogen receptor positive status [ER+]: Secondary | ICD-10-CM

## 2019-12-27 DIAGNOSIS — C50312 Malignant neoplasm of lower-inner quadrant of left female breast: Principal | ICD-10-CM

## 2019-12-27 DIAGNOSIS — C50112 Malignant neoplasm of central portion of left female breast: Principal | ICD-10-CM

## 2019-12-27 DIAGNOSIS — C50919 Malignant neoplasm of unspecified site of unspecified female breast: Principal | ICD-10-CM

## 2019-12-27 DIAGNOSIS — Z6829 Body mass index (BMI) 29.0-29.9, adult: Secondary | ICD-10-CM | POA: Diagnosis not present

## 2019-12-28 ENCOUNTER — Ambulatory Visit
Admit: 2019-12-28 | Discharge: 2020-01-05 | Payer: PRIVATE HEALTH INSURANCE | Attending: Radiation Oncology | Primary: Radiation Oncology

## 2019-12-28 DIAGNOSIS — Z170 Estrogen receptor positive status [ER+]: Principal | ICD-10-CM

## 2019-12-28 DIAGNOSIS — C50919 Malignant neoplasm of unspecified site of unspecified female breast: Principal | ICD-10-CM

## 2019-12-28 DIAGNOSIS — C50312 Malignant neoplasm of lower-inner quadrant of left female breast: Principal | ICD-10-CM

## 2019-12-28 DIAGNOSIS — J449 Chronic obstructive pulmonary disease, unspecified: Secondary | ICD-10-CM | POA: Diagnosis not present

## 2019-12-28 DIAGNOSIS — E785 Hyperlipidemia, unspecified: Secondary | ICD-10-CM | POA: Diagnosis not present

## 2019-12-28 DIAGNOSIS — I2510 Atherosclerotic heart disease of native coronary artery without angina pectoris: Secondary | ICD-10-CM | POA: Diagnosis not present

## 2019-12-28 DIAGNOSIS — E119 Type 2 diabetes mellitus without complications: Secondary | ICD-10-CM | POA: Diagnosis not present

## 2019-12-28 DIAGNOSIS — Z6829 Body mass index (BMI) 29.0-29.9, adult: Secondary | ICD-10-CM | POA: Diagnosis not present

## 2019-12-28 DIAGNOSIS — Z87891 Personal history of nicotine dependence: Secondary | ICD-10-CM | POA: Diagnosis not present

## 2019-12-28 DIAGNOSIS — I10 Essential (primary) hypertension: Secondary | ICD-10-CM | POA: Diagnosis not present

## 2019-12-29 DIAGNOSIS — D649 Anemia, unspecified: Secondary | ICD-10-CM | POA: Diagnosis not present

## 2019-12-29 DIAGNOSIS — C50512 Malignant neoplasm of lower-outer quadrant of left female breast: Secondary | ICD-10-CM | POA: Diagnosis not present

## 2019-12-29 DIAGNOSIS — E039 Hypothyroidism, unspecified: Secondary | ICD-10-CM | POA: Diagnosis not present

## 2019-12-29 DIAGNOSIS — Z955 Presence of coronary angioplasty implant and graft: Secondary | ICD-10-CM | POA: Diagnosis not present

## 2019-12-29 DIAGNOSIS — Z170 Estrogen receptor positive status [ER+]: Secondary | ICD-10-CM | POA: Diagnosis not present

## 2020-01-01 ENCOUNTER — Telehealth: Payer: Self-pay | Admitting: Gastroenterology

## 2020-01-01 DIAGNOSIS — D649 Anemia, unspecified: Secondary | ICD-10-CM | POA: Diagnosis not present

## 2020-01-01 NOTE — Telephone Encounter (Signed)
Patient came into our office (pt of DR Allen Norris) she states she has a Hemoglobin of 8-.1. She has spoke to her PCP  & they state to just keep an eye on it. She Is scheduled for breast surgery for 01-11-2020 @ [facility] Please at [facility] !!!

## 2020-01-02 ENCOUNTER — Ambulatory Visit: Admit: 2020-01-02 | Discharge: 2020-01-03 | Payer: PRIVATE HEALTH INSURANCE

## 2020-01-02 ENCOUNTER — Other Ambulatory Visit: Payer: Self-pay

## 2020-01-02 ENCOUNTER — Other Ambulatory Visit: Payer: PPO

## 2020-01-02 ENCOUNTER — Ambulatory Visit: Payer: PPO | Admitting: Oncology

## 2020-01-02 DIAGNOSIS — Z9989 Dependence on other enabling machines and devices: Secondary | ICD-10-CM

## 2020-01-02 DIAGNOSIS — E039 Hypothyroidism, unspecified: Principal | ICD-10-CM

## 2020-01-02 DIAGNOSIS — I10 Essential (primary) hypertension: Principal | ICD-10-CM

## 2020-01-02 DIAGNOSIS — I2510 Atherosclerotic heart disease of native coronary artery without angina pectoris: Principal | ICD-10-CM

## 2020-01-02 DIAGNOSIS — Z170 Estrogen receptor positive status [ER+]: Secondary | ICD-10-CM

## 2020-01-02 DIAGNOSIS — K759 Inflammatory liver disease, unspecified: Principal | ICD-10-CM

## 2020-01-02 DIAGNOSIS — E139 Other specified diabetes mellitus without complications: Principal | ICD-10-CM

## 2020-01-02 DIAGNOSIS — G4733 Obstructive sleep apnea (adult) (pediatric): Principal | ICD-10-CM

## 2020-01-02 DIAGNOSIS — C50312 Malignant neoplasm of lower-inner quadrant of left female breast: Principal | ICD-10-CM

## 2020-01-02 DIAGNOSIS — J449 Chronic obstructive pulmonary disease, unspecified: Principal | ICD-10-CM

## 2020-01-02 DIAGNOSIS — D649 Anemia, unspecified: Secondary | ICD-10-CM

## 2020-01-02 NOTE — Telephone Encounter (Signed)
Spoke with pt regarding her hemoglobin. Pt is having a pre op appt today at [facility] for her breast surgery. Advised pt to discuss this at that appt. The physician doing this surgery will advise her on it.

## 2020-01-03 ENCOUNTER — Inpatient Hospital Stay (HOSPITAL_BASED_OUTPATIENT_CLINIC_OR_DEPARTMENT_OTHER): Payer: PPO | Admitting: Oncology

## 2020-01-03 ENCOUNTER — Inpatient Hospital Stay: Payer: PPO

## 2020-01-03 ENCOUNTER — Encounter: Payer: Self-pay | Admitting: Oncology

## 2020-01-03 ENCOUNTER — Other Ambulatory Visit: Payer: Self-pay

## 2020-01-03 VITALS — BP 144/83 | HR 92 | Temp 96.9°F | Resp 18 | Wt 184.8 lb

## 2020-01-03 DIAGNOSIS — Q2730 Arteriovenous malformation, site unspecified: Secondary | ICD-10-CM | POA: Diagnosis not present

## 2020-01-03 DIAGNOSIS — D500 Iron deficiency anemia secondary to blood loss (chronic): Secondary | ICD-10-CM | POA: Diagnosis not present

## 2020-01-03 DIAGNOSIS — C50312 Malignant neoplasm of lower-inner quadrant of left female breast: Secondary | ICD-10-CM | POA: Diagnosis not present

## 2020-01-03 DIAGNOSIS — C50919 Malignant neoplasm of unspecified site of unspecified female breast: Secondary | ICD-10-CM

## 2020-01-03 DIAGNOSIS — D649 Anemia, unspecified: Secondary | ICD-10-CM

## 2020-01-03 LAB — CBC WITH DIFFERENTIAL/PLATELET
Abs Immature Granulocytes: 0.02 10*3/uL (ref 0.00–0.07)
Basophils Absolute: 0.1 10*3/uL (ref 0.0–0.1)
Basophils Relative: 1 %
Eosinophils Absolute: 0.3 10*3/uL (ref 0.0–0.5)
Eosinophils Relative: 6 %
HCT: 27 % — ABNORMAL LOW (ref 36.0–46.0)
Hemoglobin: 8.3 g/dL — ABNORMAL LOW (ref 12.0–15.0)
Immature Granulocytes: 0 %
Lymphocytes Relative: 30 %
Lymphs Abs: 1.7 10*3/uL (ref 0.7–4.0)
MCH: 26.9 pg (ref 26.0–34.0)
MCHC: 30.7 g/dL (ref 30.0–36.0)
MCV: 87.4 fL (ref 80.0–100.0)
Monocytes Absolute: 0.5 10*3/uL (ref 0.1–1.0)
Monocytes Relative: 10 %
Neutro Abs: 3 10*3/uL (ref 1.7–7.7)
Neutrophils Relative %: 53 %
Platelets: 289 10*3/uL (ref 150–400)
RBC: 3.09 MIL/uL — ABNORMAL LOW (ref 3.87–5.11)
RDW: 14.6 % (ref 11.5–15.5)
WBC: 5.7 10*3/uL (ref 4.0–10.5)
nRBC: 0 % (ref 0.0–0.2)

## 2020-01-03 LAB — COMPREHENSIVE METABOLIC PANEL
ALT: 13 U/L (ref 0–44)
AST: 18 U/L (ref 15–41)
Albumin: 3.9 g/dL (ref 3.5–5.0)
Alkaline Phosphatase: 64 U/L (ref 38–126)
Anion gap: 9 (ref 5–15)
BUN: 22 mg/dL (ref 8–23)
CO2: 25 mmol/L (ref 22–32)
Calcium: 9.2 mg/dL (ref 8.9–10.3)
Chloride: 106 mmol/L (ref 98–111)
Creatinine, Ser: 1.07 mg/dL — ABNORMAL HIGH (ref 0.44–1.00)
GFR calc Af Amer: >60 mL/min (ref >60)
GFR calc non Af Amer: 53 mL/min — ABNORMAL LOW (ref >60)
Glucose, Bld: 125 mg/dL — ABNORMAL HIGH (ref 70–99)
Potassium: 4.8 mmol/L (ref 3.5–5.1)
Sodium: 140 mmol/L (ref 135–145)
Total Bilirubin: 0.4 mg/dL (ref 0.3–1.2)
Total Protein: 7.3 g/dL (ref 6.5–8.1)

## 2020-01-03 LAB — IRON AND TIBC
Iron: 21 ug/dL — ABNORMAL LOW (ref 28–170)
Saturation Ratios: 6 % — ABNORMAL LOW (ref 10.4–31.8)
TIBC: 379 ug/dL (ref 250–450)
UIBC: 358 ug/dL

## 2020-01-03 LAB — FERRITIN: Ferritin: 8 ng/mL — ABNORMAL LOW (ref 11–307)

## 2020-01-03 NOTE — Progress Notes (Signed)
Patient's PCP would like for her to be evaluated for anemia.  She does have history of blood transfusion during hospital admission in 10/2019.  Does have symptoms of weakness, feeling off balance, and leg "heaviness".

## 2020-01-03 NOTE — Progress Notes (Addendum)
Hematology/Oncology  note [facility]enterology Telephone:([phone] [phone]   Patient Care Team: [place] as PCP - General Lucilla Lame, MD as Consulting Physician (Gastroenterology) Anabel Bene, MD as Referring Physician (Neurology) Theodore Demark, RN as Oncology Nurse Navigator Earlie Server, MD as Consulting Physician (Oncology)  REFERRING PROVIDER:  CHIEF COMPLAINTS/REASON FOR VISIT:  Evaluation for iron deficiency anemia  HISTORY OF PRESENTING ILLNESS:  Leslie Olsen is a  69 y.o.  female with PMH listed below who was referred to me for evaluation of breast cancer  Patient had screening mammogram on 10/24/2019 and mammogram showed a possible asymmetry in the left which warrants further evaluation.  Diagnostic left breast and ultrasound was done 32/2021 and showed suspicious mass 7x4x53m with possible associated distortion in the left breast at 5o'clock.  No evidence of left axillary lymphadenopathy. Left breast mass was biopsied and pathology showed: Invasive mammary carcinoma no special type, DCIS present, intermediate grade.  ER 91-100%, PR 81-90%, HER2 negative  Nipple discharge: Denies Menarche 69years old Age of first live birth was [birth year]Hysterectomy at 69years old  Family history: Maternal aunt with breast cancer at age of 542  Brother has lung cancer OCP use: denies.  Estrogen and progesterone therapy: 2 years of hormone replacement in her late 477s   History of radiation to chest: denies.  She has a history of radioactive iodine ablation Previous breast surgery: History of breast augmentation History of left breast biopsy in 1970 She denies any nipple discharge.   Patient has established care with Surgery Dr.Sakai. She is on dural antiplatelet therapy due to drug eluting stent that was placed in November 2020. Cardiology does not clear patient to be off antiplatelet therapy due to risk of thrombosis.  Patient does not like  the idea of neoadjuvant endocrine therapy.  [facility]  INTERVAL HISTORY Leslie BRINKERis a 69y.o. female who has above history reviewed by me today referred by primary care provider to me for evaluation of anemia.  Problems and complaints are listed below:  Patient reports feeling increasing fatigue and tiredness. Patient was seen by me last time for breast cancer management.  She does not like neoadjuvant endocrine therapy and [facility]  She has being evaluated by surgery and has a tentative surgery on 01/11/2020.  [facility] to get [facility] to come off on aspirin Effient. Patient denies any bloody or black stool.  Denies any abdominal pain. She has a history of bleeding AVMs status post cauterization.    Review of Systems  Constitutional: Positive for fatigue. Negative for appetite change, chills and fever.  HENT:   Negative for hearing loss and voice change.   Eyes: Negative for eye problems.  Respiratory: Negative for chest tightness, cough and shortness of breath.   Cardiovascular: Negative for chest pain.  Gastrointestinal: Negative for abdominal distention, abdominal pain and blood in stool.  Endocrine: Negative for hot flashes.  Genitourinary: Negative for difficulty urinating and frequency.   Musculoskeletal: Negative for arthralgias.  Skin: Negative for itching and rash.  Neurological: Negative for extremity weakness.  Hematological: Negative for adenopathy.  Psychiatric/Behavioral: Negative for confusion.    MEDICAL HISTORY:  Past Medical History:  Diagnosis Date  . Anemia   . Cancer ([facility]    Breast cancer   . Cephalalgia 04/08/2015  . Chronic headaches   .  COPD (chronic obstructive pulmonary disease) ([place])   . Diabetes mellitus without complication ([place])   . Fatigue due to excessive exertion 08/22/2018  . GERD (gastroesophageal reflux disease)    . Hypertension   . Hypothyroidism   . [facility] spotted fever) 07/27/2016  . Sleep apnea     SURGICAL HISTORY: Past Surgical History:  Procedure Laterality Date  . ABDOMINAL HYSTERECTOMY    . BACK SURGERY    . BREAST BIOPSY Left 11/15/2019   [place] bx , heart marker   , path pending  . BREAST EXCISIONAL BIOPSY Left 1970's  . CHOLECYSTECTOMY    . COLONOSCOPY N/A 11/05/2019   Procedure: COLONOSCOPY;  Surgeon: Toledo, Benay Pike, MD;  Location: [facility] ENDOSCOPY;  Service: Gastroenterology;  Laterality: N/A;  . CORONARY STENT INTERVENTION N/A 07/31/2019   Procedure: CORONARY STENT INTERVENTION;  Surgeon: Isaias Cowman, MD;  Location: [place] CV LAB;  Service: Cardiovascular;  Laterality: N/A;  RCA  . CORONARY STENT PLACEMENT    . ESOPHAGOGASTRODUODENOSCOPY N/A 11/05/2019   Procedure: ESOPHAGOGASTRODUODENOSCOPY (EGD);  Surgeon: Toledo, Benay Pike, MD;  Location: [facility] ENDOSCOPY;  Service: Gastroenterology;  Laterality: N/A;  . ESOPHAGOGASTRODUODENOSCOPY (EGD) WITH PROPOFOL N/A 05/26/2016   Procedure: ESOPHAGOGASTRODUODENOSCOPY (EGD) WITH PROPOFOL;  Surgeon: Lucilla Lame, MD;  Location: [facility] ENDOSCOPY;  Service: Endoscopy;  Laterality: N/A;  . GIVENS CAPSULE STUDY N/A 05/05/2016   Procedure: GIVENS CAPSULE STUDY;  Surgeon: Lucilla Lame, MD;  Location: [facility] ENDOSCOPY;  Service: Endoscopy;  Laterality: N/A;  . JOINT REPLACEMENT    . LEFT HEART CATH AND CORONARY ANGIOGRAPHY Left 07/31/2019   Procedure: LEFT HEART CATH AND CORONARY ANGIOGRAPHY;  Surgeon: Isaias Cowman, MD;  Location: [place] CV LAB;  Service: Cardiovascular;  Laterality: Left;  . REDUCTION MAMMAPLASTY    . REPLACEMENT TOTAL KNEE    . SHOULDER SURGERY    . TIBIA FRACTURE SURGERY      SOCIAL HISTORY: Social History   Socioeconomic History  . Marital status: Divorced    Spouse name: Not on file  . Number of children: 1  . Years of education: 93  . Highest education level: 12th grade    Occupational History  . Occupation: retired   Tobacco Use  . Smoking status: Former Smoker    Packs/day: 2.00    Years: 45.00    Pack years: 90.00    Types: Cigarettes    Quit date: 11/06/2011    Years since quitting: 8.1  . Smokeless tobacco: Never Used  Substance and Sexual Activity  . Alcohol use: No  . Drug use: No  . Sexual activity: Never  Other Topics Concern  . Not on file  Social History Narrative   Lives alone.  She has one grown son.   She works as a Retail buyer at [employer]   Highest level of education was high school   Social Determinants of Health   Financial Resource Strain:   . Difficulty of Paying Living Expenses:   Food Insecurity:   . Worried About Charity fundraiser in the Last Year:   . Arboriculturist in the Last Year:   Transportation Needs:   . Film/video editor (Medical):   Marland Kitchen Lack of Transportation (Non-Medical):   Physical Activity:   . Days of Exercise per Week:   . Minutes of Exercise per Session:   Stress:   . Feeling of Stress :   Social Connections:   . Frequency of Communication with Friends and Family:   . Frequency of  Social Gatherings with Friends and Family:   . Attends Religious Services:   . Active Member of Clubs or Organizations:   . Attends Archivist Meetings:   Marland Kitchen Marital Status:   Intimate Partner Violence:   . Fear of Current or Ex-Partner:   . Emotionally Abused:   Marland Kitchen Physically Abused:   . Sexually Abused:     FAMILY HISTORY: Family History  Problem Relation Age of Onset  . Lung cancer Maternal Aunt   . Breast cancer Maternal Aunt 57  . Other Mother        Killed in car accident, 57  . Other Father        Killed in car accident, 33  . Thyroid disease Brother   . Thyroid disease Sister     ALLERGIES:  has No Known Allergies.  MEDICATIONS:  Current Outpatient Medications  Medication Sig Dispense Refill  . aspirin 81 MG tablet Take 81 mg by mouth daily.    . Cholecalciferol (VITAMIN D)  50 MCG (2000 UT) CAPS Take 2,000 Units by mouth daily.     . clonazePAM (KLONOPIN) 0.5 MG tablet Take 0.5 mg by mouth 2 (two) times daily.     . ferrous gluconate (FERGON) 324 MG tablet Take 324 mg by mouth daily.  11  . glucose blood (ONE TOUCH ULTRA TEST) test strip USE ONE STRIP TO CHECK GLUCOSE TWICE DAILY 100 each 12  . levETIRAcetam (KEPPRA) 500 MG tablet Take 500 mg by mouth daily.     Marland Kitchen lisinopril (ZESTRIL) 10 MG tablet Take 1 tablet (10 mg total) by mouth daily. 30 tablet 0  . metFORMIN (GLUCOPHAGE) 500 MG tablet Take 1 tablet (500 mg total) by mouth daily with breakfast. (Patient taking differently: Take 1,000 mg by mouth 2 (two) times daily with a meal. ) 90 tablet 1  . pantoprazole (PROTONIX) 40 MG tablet Take 40 mg by mouth daily as needed (Heartburn).   3  . prasugrel (EFFIENT) 10 MG TABS tablet Take 1 tablet (10 mg total) by mouth daily. 30 tablet 5  . SUMAtriptan (IMITREX) 100 MG tablet Take 1 tablet (100 mg total) by mouth every 2 (two) hours as needed for migraine. May repeat in 2 hours if headache persists or recurs. 10 tablet 12  . SYNTHROID 100 MCG tablet Take 1 tablet (100 mcg total) by mouth daily before breakfast. 90 tablet 3  . anastrozole (ARIMIDEX) 1 MG tablet Take 1 tablet (1 mg total) by mouth daily. (Patient not taking: Reported on 01/03/2020) 30 tablet 0   No current facility-administered medications for this visit.     PHYSICAL EXAMINATION: ECOG PERFORMANCE STATUS: 0 - Asymptomatic Vitals:   01/03/20 1124  BP: (!) 144/83  Pulse: 92  Resp: 18  Temp: (!) 96.9 F (36.1 C)   Filed Weights   01/03/20 1124  Weight: 184 lb 12.8 oz (83.8 kg)   Physical Exam  Constitutional: She is oriented to person, place, and time. No distress.  HENT:  Head: Normocephalic and atraumatic.  Nose: Nose normal.  Mouth/Throat: Oropharynx is clear and moist. No oropharyngeal exudate.  Eyes: Pupils are equal, round, and reactive to light. EOM are normal. No scleral icterus.   Cardiovascular: Normal rate and regular rhythm.  No murmur heard. Pulmonary/Chest: Effort normal. No respiratory distress. She has no rales. She exhibits no tenderness.  Abdominal: Soft. She exhibits no distension. There is no abdominal tenderness.  Musculoskeletal:        General: No edema. Normal range  of motion.     Cervical back: Normal range of motion and neck supple.  Neurological: She is alert and oriented to person, place, and time.  Skin: Skin is warm and dry. She is not diaphoretic. No erythema.  Psychiatric: Affect normal.      LABORATORY DATA:  I have reviewed the data as listed Lab Results  Component Value Date   WBC 5.7 01/03/2020   HGB 8.3 (L) 01/03/2020   HCT 27.0 (L) 01/03/2020   MCV 87.4 01/03/2020   PLT 289 01/03/2020   Recent Labs    11/03/19 1221 11/04/19 0546 11/06/19 0415 11/22/19 1158 01/03/20 1059  NA 135   < > 140 137 140  K 4.9   < > 4.0 5.4* 4.8  CL 107   < > 111 103 106  CO2 22   < > _0 GLUCOSE 135*   < > 130* 130* 125*  BUN 29*   < > _1 CREATININE 1.17*   < > 1.13* 1.21* 1.07*  CALCIUM 9.1   < > 9.0 9.8 9.2  GFRNONAA 47*   < > 49* 45* 53*  GFRAA 55*   < > 57* 52* >60  PROT 7.4  --   --  8.2* 7.3  ALBUMIN 4.3  --   --  4.6 3.9  AST 23  --   --  20 18  ALT 15  --   --  16 13  ALKPHOS 75  --   --  81 64  BILITOT 0.5  --   --  0.5 0.4   < > = values in this interval not displayed.   Iron/TIBC/Ferritin/ %Sat    Component Value Date/Time   IRON 21 (L) 01/03/2020 1059   IRON 17 (L) 11/02/2019 1008   IRON 48 12/13/2014 1455   TIBC 379 01/03/2020 1059   TIBC 353 11/02/2019 1008   TIBC 378 12/13/2014 1455   FERRITIN 8 (L) 01/03/2020 1059   FERRITIN 15 11/02/2019 1008   FERRITIN 11 12/13/2014 1455   IRONPCTSAT 6 (L) 01/03/2020 1059   IRONPCTSAT 5 (LL) 11/02/2019 1008   IRONPCTSAT 12.7 12/13/2014 1455        ASSESSMENT & PLAN:  1. Iron deficiency anemia due to chronic blood loss   2. AVM (arteriovenous  malformation)   3. Invasive carcinoma of breast ([place])   Cancer Staging Invasive carcinoma of breast ([place]) Staging form: Breast, AJCC 8th Edition - Clinical stage from 11/24/2019: Stage IA (cT1b, cN0, cM0, G1, ER+, PR+, HER2-) - Signed by Earlie Server, MD on 11/24/2019  #Iron deficiency anemia Labs reviewed and discussed with patient. Patient has acute drop of hemoglobin from 10.5 to 8.3, Iron panel was available after patient's visit.  I discussed with patient about the rationale and potential side effects: Allergy reactions/infusion reaction including anaphylactic reaction discussed with patient. Other side effects include but not limited to high blood pressure, skin rash, weight gain, leg swelling, etc. Patient voices understanding and willing to proceed IV iron treatments if iron test confirmed iron deficiency.  She agrees. Ferritin has decreased to 8, iron saturation decreased to 6. Patient has previously received IV Venofer treatments. Plan IV iron with Venofer 278m weekly x 3 doses. . Suspect chronic GI blood loss. Also communicated with Dr. WAllen Norrisand he will schedule patient for additional work-up-small bowel capsule study.  Invasive breast cancer, stage IA Her local cardiologist did not clear her to temporarily discontinue aspirin and Effient for breast  surgery.  Dr. Lysle Pearl feels bleeding risk is high for breast surgery.  I previously discussed with patient about neoadjuvant endocrine therapy until patient comes off aspirin and Effient this summer.  Patient decided to seek a second opinion at [facility] surgery and was scheduled for a surgery appointment on 01/11/2020.  She is also going to establish care with [facility] cardiology for clearance of procedure. Patient prefers to have postoperation oncology care at [facility].  She appreciates the care I provided and wants to switch to [facility] due to personal preference. She understands that I will no longer manage her breast cancer. She is willing to follow-up with me for  management of iron deficiency.    All questions were answered. The patient knows to call the clinic with any problems questions or concerns.  Return of visit: 6 weeks    Earlie Server, MD, PhD Hematology Oncology [facility] Pager- [phone] 01/03/2020

## 2020-01-05 ENCOUNTER — Inpatient Hospital Stay: Payer: PPO

## 2020-01-05 ENCOUNTER — Other Ambulatory Visit: Payer: Self-pay

## 2020-01-05 VITALS — BP 140/65 | HR 91 | Temp 97.0°F | Resp 18

## 2020-01-05 DIAGNOSIS — D500 Iron deficiency anemia secondary to blood loss (chronic): Secondary | ICD-10-CM

## 2020-01-05 DIAGNOSIS — C50312 Malignant neoplasm of lower-inner quadrant of left female breast: Secondary | ICD-10-CM | POA: Diagnosis not present

## 2020-01-05 MED ORDER — SODIUM CHLORIDE 0.9 % IV SOLN
Freq: Once | INTRAVENOUS | Status: AC
  Filled 2020-01-05: qty 250

## 2020-01-05 MED ORDER — IRON SUCROSE 20 MG/ML IV SOLN
200.0000 mg | Freq: Once | INTRAVENOUS | Status: AC
  Administered 2020-01-05: 200 mg via INTRAVENOUS
  Filled 2020-01-05: qty 10

## 2020-01-08 ENCOUNTER — Other Ambulatory Visit: Payer: Self-pay

## 2020-01-08 ENCOUNTER — Inpatient Hospital Stay: Payer: PPO | Attending: Oncology

## 2020-01-08 VITALS — BP 111/68 | HR 94 | Temp 96.5°F | Resp 20

## 2020-01-08 DIAGNOSIS — D509 Iron deficiency anemia, unspecified: Secondary | ICD-10-CM | POA: Diagnosis not present

## 2020-01-08 DIAGNOSIS — C50919 Malignant neoplasm of unspecified site of unspecified female breast: Secondary | ICD-10-CM | POA: Insufficient documentation

## 2020-01-08 DIAGNOSIS — Z79811 Long term (current) use of aromatase inhibitors: Secondary | ICD-10-CM | POA: Diagnosis not present

## 2020-01-08 DIAGNOSIS — Z170 Estrogen receptor positive status [ER+]: Secondary | ICD-10-CM | POA: Diagnosis not present

## 2020-01-08 DIAGNOSIS — D500 Iron deficiency anemia secondary to blood loss (chronic): Secondary | ICD-10-CM

## 2020-01-08 MED ORDER — SODIUM CHLORIDE 0.9 % IV SOLN
Freq: Once | INTRAVENOUS | Status: AC
  Filled 2020-01-08: qty 250

## 2020-01-08 MED ORDER — IRON SUCROSE 20 MG/ML IV SOLN
200.0000 mg | Freq: Once | INTRAVENOUS | Status: AC
  Administered 2020-01-08: 200 mg via INTRAVENOUS
  Filled 2020-01-08: qty 10

## 2020-01-09 ENCOUNTER — Ambulatory Visit
Admit: 2020-01-09 | Discharge: 2020-01-10 | Payer: PRIVATE HEALTH INSURANCE | Attending: Cardiovascular Disease | Primary: Cardiovascular Disease

## 2020-01-09 ENCOUNTER — Ambulatory Visit: Payer: PPO

## 2020-01-09 ENCOUNTER — Other Ambulatory Visit: Payer: Self-pay

## 2020-01-09 DIAGNOSIS — C50919 Malignant neoplasm of unspecified site of unspecified female breast: Principal | ICD-10-CM

## 2020-01-09 DIAGNOSIS — I2510 Atherosclerotic heart disease of native coronary artery without angina pectoris: Principal | ICD-10-CM

## 2020-01-09 DIAGNOSIS — E119 Type 2 diabetes mellitus without complications: Principal | ICD-10-CM

## 2020-01-09 DIAGNOSIS — I10 Essential (primary) hypertension: Principal | ICD-10-CM

## 2020-01-09 DIAGNOSIS — K5520 Angiodysplasia of colon without hemorrhage: Secondary | ICD-10-CM | POA: Diagnosis not present

## 2020-01-09 DIAGNOSIS — Z6829 Body mass index (BMI) 29.0-29.9, adult: Secondary | ICD-10-CM | POA: Diagnosis not present

## 2020-01-09 DIAGNOSIS — D500 Iron deficiency anemia secondary to blood loss (chronic): Secondary | ICD-10-CM | POA: Diagnosis not present

## 2020-01-09 DIAGNOSIS — C50312 Malignant neoplasm of lower-inner quadrant of left female breast: Secondary | ICD-10-CM | POA: Diagnosis not present

## 2020-01-09 DIAGNOSIS — Z170 Estrogen receptor positive status [ER+]: Secondary | ICD-10-CM | POA: Diagnosis not present

## 2020-01-09 MED ORDER — ROSUVASTATIN 5 MG TABLET
ORAL_TABLET | Freq: Every day | ORAL | 6 refills | 60.00000 days | Status: CP
Start: 2020-01-09 — End: 2020-02-08

## 2020-01-10 ENCOUNTER — Other Ambulatory Visit: Payer: Self-pay

## 2020-01-10 MED ORDER — RIFAXIMIN 550 MG PO TABS: 550.0000 mg | ORAL_TABLET | Freq: Three times a day (TID) | ORAL | 0 refills | Status: DC

## 2020-01-12 ENCOUNTER — Other Ambulatory Visit: Payer: Self-pay

## 2020-01-12 ENCOUNTER — Inpatient Hospital Stay: Payer: PPO

## 2020-01-12 VITALS — BP 146/68 | HR 90 | Temp 96.6°F | Resp 20

## 2020-01-12 DIAGNOSIS — D500 Iron deficiency anemia secondary to blood loss (chronic): Secondary | ICD-10-CM

## 2020-01-12 DIAGNOSIS — D509 Iron deficiency anemia, unspecified: Secondary | ICD-10-CM | POA: Diagnosis not present

## 2020-01-12 MED ORDER — IRON SUCROSE 20 MG/ML IV SOLN
200.0000 mg | Freq: Once | INTRAVENOUS | Status: AC
  Administered 2020-01-12: 200 mg via INTRAVENOUS
  Filled 2020-01-12: qty 10

## 2020-01-12 MED ORDER — SODIUM CHLORIDE 0.9 % IV SOLN
Freq: Once | INTRAVENOUS | Status: AC
  Filled 2020-01-12: qty 250

## 2020-01-17 ENCOUNTER — Ambulatory Visit: Admit: 2020-01-17 | Discharge: 2020-01-18 | Payer: PRIVATE HEALTH INSURANCE | Attending: Family | Primary: Family

## 2020-01-17 DIAGNOSIS — Z01812 Encounter for preprocedural laboratory examination: Secondary | ICD-10-CM | POA: Diagnosis not present

## 2020-01-17 DIAGNOSIS — Z20822 Contact with and (suspected) exposure to covid-19: Secondary | ICD-10-CM | POA: Diagnosis not present

## 2020-01-19 ENCOUNTER — Encounter
Admit: 2020-01-19 | Discharge: 2020-01-19 | Payer: PRIVATE HEALTH INSURANCE | Attending: Anesthesiology | Primary: Anesthesiology

## 2020-01-19 ENCOUNTER — Ambulatory Visit: Admit: 2020-01-19 | Discharge: 2020-01-19 | Payer: PRIVATE HEALTH INSURANCE

## 2020-01-19 DIAGNOSIS — I2510 Atherosclerotic heart disease of native coronary artery without angina pectoris: Secondary | ICD-10-CM | POA: Diagnosis not present

## 2020-01-19 DIAGNOSIS — E039 Hypothyroidism, unspecified: Secondary | ICD-10-CM | POA: Diagnosis not present

## 2020-01-19 DIAGNOSIS — Z170 Estrogen receptor positive status [ER+]: Secondary | ICD-10-CM | POA: Diagnosis not present

## 2020-01-19 DIAGNOSIS — Z955 Presence of coronary angioplasty implant and graft: Secondary | ICD-10-CM | POA: Diagnosis not present

## 2020-01-19 DIAGNOSIS — G4733 Obstructive sleep apnea (adult) (pediatric): Secondary | ICD-10-CM | POA: Diagnosis not present

## 2020-01-19 DIAGNOSIS — Z79899 Other long term (current) drug therapy: Secondary | ICD-10-CM | POA: Diagnosis not present

## 2020-01-19 DIAGNOSIS — Z87891 Personal history of nicotine dependence: Secondary | ICD-10-CM | POA: Diagnosis not present

## 2020-01-19 DIAGNOSIS — Z6829 Body mass index (BMI) 29.0-29.9, adult: Secondary | ICD-10-CM | POA: Diagnosis not present

## 2020-01-19 DIAGNOSIS — J449 Chronic obstructive pulmonary disease, unspecified: Secondary | ICD-10-CM | POA: Diagnosis not present

## 2020-01-19 DIAGNOSIS — C50912 Malignant neoplasm of unspecified site of left female breast: Secondary | ICD-10-CM | POA: Diagnosis not present

## 2020-01-19 DIAGNOSIS — I10 Essential (primary) hypertension: Secondary | ICD-10-CM | POA: Diagnosis not present

## 2020-01-19 DIAGNOSIS — Z9882 Breast implant status: Secondary | ICD-10-CM | POA: Diagnosis not present

## 2020-01-19 DIAGNOSIS — E669 Obesity, unspecified: Secondary | ICD-10-CM | POA: Diagnosis not present

## 2020-01-19 DIAGNOSIS — E785 Hyperlipidemia, unspecified: Secondary | ICD-10-CM | POA: Diagnosis not present

## 2020-01-19 DIAGNOSIS — C50312 Malignant neoplasm of lower-inner quadrant of left female breast: Secondary | ICD-10-CM | POA: Diagnosis not present

## 2020-01-19 DIAGNOSIS — E119 Type 2 diabetes mellitus without complications: Secondary | ICD-10-CM | POA: Diagnosis not present

## 2020-01-19 DIAGNOSIS — C50512 Malignant neoplasm of lower-outer quadrant of left female breast: Secondary | ICD-10-CM | POA: Diagnosis not present

## 2020-01-19 DIAGNOSIS — Z9049 Acquired absence of other specified parts of digestive tract: Secondary | ICD-10-CM | POA: Diagnosis not present

## 2020-01-19 DIAGNOSIS — Z7984 Long term (current) use of oral hypoglycemic drugs: Secondary | ICD-10-CM | POA: Diagnosis not present

## 2020-01-19 DIAGNOSIS — Z801 Family history of malignant neoplasm of trachea, bronchus and lung: Secondary | ICD-10-CM | POA: Diagnosis not present

## 2020-01-19 MED ORDER — OXYCODONE-ACETAMINOPHEN 5 MG-325 MG TABLET
ORAL_TABLET | ORAL | 0 refills | 1.00000 days | Status: CP | PRN
Start: 2020-01-19 — End: 2020-01-24

## 2020-01-19 MED ORDER — ONDANSETRON 4 MG DISINTEGRATING TABLET
ORAL_TABLET | Freq: Three times a day (TID) | ORAL | 0 refills | 4.00000 days | Status: CP | PRN
Start: 2020-01-19 — End: 2020-01-26

## 2020-01-19 MED ORDER — DOCUSATE SODIUM 100 MG CAPSULE
ORAL_CAPSULE | Freq: Two times a day (BID) | ORAL | 0 refills | 30.00000 days | Status: CP
Start: 2020-01-19 — End: 2020-02-18

## 2020-01-29 ENCOUNTER — Ambulatory Visit: Admit: 2020-01-29 | Discharge: 2020-01-30 | Payer: PRIVATE HEALTH INSURANCE

## 2020-01-29 DIAGNOSIS — Z170 Estrogen receptor positive status [ER+]: Secondary | ICD-10-CM

## 2020-01-29 DIAGNOSIS — C50312 Malignant neoplasm of lower-inner quadrant of left female breast: Principal | ICD-10-CM

## 2020-01-29 DIAGNOSIS — Z09 Encounter for follow-up examination after completed treatment for conditions other than malignant neoplasm: Secondary | ICD-10-CM | POA: Diagnosis not present

## 2020-01-30 DIAGNOSIS — E119 Type 2 diabetes mellitus without complications: Secondary | ICD-10-CM | POA: Diagnosis not present

## 2020-01-31 ENCOUNTER — Other Ambulatory Visit: Payer: Self-pay

## 2020-02-06 ENCOUNTER — Ambulatory Visit
Admit: 2020-02-06 | Discharge: 2020-03-06 | Payer: PRIVATE HEALTH INSURANCE | Attending: Radiation Oncology | Primary: Radiation Oncology

## 2020-02-06 ENCOUNTER — Ambulatory Visit: Admit: 2020-02-06 | Discharge: 2020-03-06 | Payer: PRIVATE HEALTH INSURANCE

## 2020-02-06 ENCOUNTER — Encounter
Admit: 2020-02-06 | Discharge: 2020-03-06 | Payer: PRIVATE HEALTH INSURANCE | Attending: Radiation Oncology | Primary: Radiation Oncology

## 2020-02-06 DIAGNOSIS — G4733 Obstructive sleep apnea (adult) (pediatric): Secondary | ICD-10-CM | POA: Diagnosis not present

## 2020-02-06 DIAGNOSIS — E119 Type 2 diabetes mellitus without complications: Secondary | ICD-10-CM | POA: Diagnosis not present

## 2020-02-06 DIAGNOSIS — I10 Essential (primary) hypertension: Secondary | ICD-10-CM | POA: Diagnosis not present

## 2020-02-06 DIAGNOSIS — E039 Hypothyroidism, unspecified: Secondary | ICD-10-CM | POA: Diagnosis not present

## 2020-02-06 DIAGNOSIS — J449 Chronic obstructive pulmonary disease, unspecified: Secondary | ICD-10-CM | POA: Diagnosis not present

## 2020-02-06 DIAGNOSIS — Z170 Estrogen receptor positive status [ER+]: Secondary | ICD-10-CM | POA: Diagnosis not present

## 2020-02-06 DIAGNOSIS — Z0001 Encounter for general adult medical examination with abnormal findings: Secondary | ICD-10-CM | POA: Diagnosis not present

## 2020-02-06 DIAGNOSIS — C50512 Malignant neoplasm of lower-outer quadrant of left female breast: Secondary | ICD-10-CM | POA: Diagnosis not present

## 2020-02-08 DIAGNOSIS — Z170 Estrogen receptor positive status [ER+]: Principal | ICD-10-CM

## 2020-02-08 DIAGNOSIS — I2510 Atherosclerotic heart disease of native coronary artery without angina pectoris: Principal | ICD-10-CM

## 2020-02-08 DIAGNOSIS — J449 Chronic obstructive pulmonary disease, unspecified: Principal | ICD-10-CM

## 2020-02-08 DIAGNOSIS — Z87891 Personal history of nicotine dependence: Principal | ICD-10-CM

## 2020-02-08 DIAGNOSIS — E785 Hyperlipidemia, unspecified: Principal | ICD-10-CM

## 2020-02-08 DIAGNOSIS — I10 Essential (primary) hypertension: Principal | ICD-10-CM

## 2020-02-08 DIAGNOSIS — C50919 Malignant neoplasm of unspecified site of unspecified female breast: Principal | ICD-10-CM

## 2020-02-08 DIAGNOSIS — E119 Type 2 diabetes mellitus without complications: Principal | ICD-10-CM

## 2020-02-08 DIAGNOSIS — C50812 Malignant neoplasm of overlapping sites of left female breast: Secondary | ICD-10-CM | POA: Diagnosis not present

## 2020-02-08 DIAGNOSIS — Z6830 Body mass index (BMI) 30.0-30.9, adult: Secondary | ICD-10-CM | POA: Diagnosis not present

## 2020-02-12 ENCOUNTER — Ambulatory Visit: Admit: 2020-02-12 | Discharge: 2020-02-13 | Payer: PRIVATE HEALTH INSURANCE

## 2020-02-12 ENCOUNTER — Other Ambulatory Visit: Payer: Self-pay

## 2020-02-12 DIAGNOSIS — C50919 Malignant neoplasm of unspecified site of unspecified female breast: Principal | ICD-10-CM

## 2020-02-12 DIAGNOSIS — D500 Iron deficiency anemia secondary to blood loss (chronic): Secondary | ICD-10-CM

## 2020-02-12 DIAGNOSIS — Z6830 Body mass index (BMI) 30.0-30.9, adult: Secondary | ICD-10-CM | POA: Diagnosis not present

## 2020-02-13 ENCOUNTER — Inpatient Hospital Stay: Payer: PPO | Attending: Oncology

## 2020-02-13 ENCOUNTER — Other Ambulatory Visit: Payer: Self-pay

## 2020-02-13 DIAGNOSIS — K219 Gastro-esophageal reflux disease without esophagitis: Secondary | ICD-10-CM | POA: Diagnosis not present

## 2020-02-13 DIAGNOSIS — E875 Hyperkalemia: Secondary | ICD-10-CM | POA: Insufficient documentation

## 2020-02-13 DIAGNOSIS — D509 Iron deficiency anemia, unspecified: Secondary | ICD-10-CM | POA: Insufficient documentation

## 2020-02-13 DIAGNOSIS — Z7982 Long term (current) use of aspirin: Secondary | ICD-10-CM | POA: Diagnosis not present

## 2020-02-13 DIAGNOSIS — Z79899 Other long term (current) drug therapy: Secondary | ICD-10-CM | POA: Diagnosis not present

## 2020-02-13 DIAGNOSIS — G4730 Sleep apnea, unspecified: Secondary | ICD-10-CM | POA: Insufficient documentation

## 2020-02-13 DIAGNOSIS — Z7984 Long term (current) use of oral hypoglycemic drugs: Secondary | ICD-10-CM | POA: Diagnosis not present

## 2020-02-13 DIAGNOSIS — Z87891 Personal history of nicotine dependence: Secondary | ICD-10-CM | POA: Diagnosis not present

## 2020-02-13 DIAGNOSIS — D500 Iron deficiency anemia secondary to blood loss (chronic): Secondary | ICD-10-CM

## 2020-02-13 DIAGNOSIS — J449 Chronic obstructive pulmonary disease, unspecified: Secondary | ICD-10-CM | POA: Insufficient documentation

## 2020-02-13 DIAGNOSIS — E039 Hypothyroidism, unspecified: Secondary | ICD-10-CM | POA: Insufficient documentation

## 2020-02-13 LAB — CBC WITH DIFFERENTIAL/PLATELET
Abs Immature Granulocytes: 0.01 10*3/uL (ref 0.00–0.07)
Basophils Absolute: 0.1 10*3/uL (ref 0.0–0.1)
Basophils Relative: 1 %
Eosinophils Absolute: 0.3 10*3/uL (ref 0.0–0.5)
Eosinophils Relative: 4 %
HCT: 33.9 % — ABNORMAL LOW (ref 36.0–46.0)
Hemoglobin: 10.8 g/dL — ABNORMAL LOW (ref 12.0–15.0)
Immature Granulocytes: 0 %
Lymphocytes Relative: 28 %
Lymphs Abs: 1.8 10*3/uL (ref 0.7–4.0)
MCH: 28.3 pg (ref 26.0–34.0)
MCHC: 31.9 g/dL (ref 30.0–36.0)
MCV: 88.7 fL (ref 80.0–100.0)
Monocytes Absolute: 0.5 10*3/uL (ref 0.1–1.0)
Monocytes Relative: 8 %
Neutro Abs: 3.7 10*3/uL (ref 1.7–7.7)
Neutrophils Relative %: 59 %
Platelets: 266 10*3/uL (ref 150–400)
RBC: 3.82 MIL/uL — ABNORMAL LOW (ref 3.87–5.11)
RDW: 16.7 % — ABNORMAL HIGH (ref 11.5–15.5)
WBC: 6.3 10*3/uL (ref 4.0–10.5)
nRBC: 0 % (ref 0.0–0.2)

## 2020-02-13 LAB — COMPREHENSIVE METABOLIC PANEL
ALT: 17 U/L (ref 0–44)
AST: 21 U/L (ref 15–41)
Albumin: 4.5 g/dL (ref 3.5–5.0)
Alkaline Phosphatase: 78 U/L (ref 38–126)
Anion gap: 9 (ref 5–15)
BUN: 30 mg/dL — ABNORMAL HIGH (ref 8–23)
CO2: 26 mmol/L (ref 22–32)
Calcium: 9.8 mg/dL (ref 8.9–10.3)
Chloride: 104 mmol/L (ref 98–111)
Creatinine, Ser: 1.06 mg/dL — ABNORMAL HIGH (ref 0.44–1.00)
GFR calc Af Amer: >60 mL/min (ref >60)
GFR calc non Af Amer: 53 mL/min — ABNORMAL LOW (ref >60)
Glucose, Bld: 92 mg/dL (ref 70–99)
Potassium: 5.2 mmol/L — ABNORMAL HIGH (ref 3.5–5.1)
Sodium: 139 mmol/L (ref 135–145)
Total Bilirubin: 0.5 mg/dL (ref 0.3–1.2)
Total Protein: 8.4 g/dL — ABNORMAL HIGH (ref 6.5–8.1)

## 2020-02-13 LAB — IRON AND TIBC
Iron: 86 ug/dL (ref 28–170)
Saturation Ratios: 22 % (ref 10.4–31.8)
TIBC: 395 ug/dL (ref 250–450)
UIBC: 309 ug/dL

## 2020-02-13 LAB — FERRITIN: Ferritin: 24 ng/mL (ref 11–307)

## 2020-02-15 ENCOUNTER — Inpatient Hospital Stay: Payer: PPO

## 2020-02-15 ENCOUNTER — Inpatient Hospital Stay (HOSPITAL_BASED_OUTPATIENT_CLINIC_OR_DEPARTMENT_OTHER): Payer: PPO | Admitting: Oncology

## 2020-02-15 ENCOUNTER — Encounter: Payer: Self-pay | Admitting: Oncology

## 2020-02-15 ENCOUNTER — Other Ambulatory Visit: Payer: Self-pay

## 2020-02-15 ENCOUNTER — Ambulatory Visit: Payer: PPO | Admitting: Urology

## 2020-02-15 VITALS — BP 135/79 | HR 82 | Temp 96.4°F | Wt 186.5 lb

## 2020-02-15 DIAGNOSIS — D500 Iron deficiency anemia secondary to blood loss (chronic): Secondary | ICD-10-CM | POA: Diagnosis not present

## 2020-02-15 DIAGNOSIS — D649 Anemia, unspecified: Secondary | ICD-10-CM

## 2020-02-15 DIAGNOSIS — C50919 Malignant neoplasm of unspecified site of unspecified female breast: Secondary | ICD-10-CM

## 2020-02-15 DIAGNOSIS — N1831 Chronic kidney disease, stage 3a: Secondary | ICD-10-CM

## 2020-02-15 DIAGNOSIS — D509 Iron deficiency anemia, unspecified: Secondary | ICD-10-CM | POA: Diagnosis not present

## 2020-02-15 MED ORDER — SODIUM CHLORIDE 0.9 % IV SOLN
Freq: Once | INTRAVENOUS | Status: AC
  Filled 2020-02-15: qty 250

## 2020-02-15 MED ORDER — IRON SUCROSE 20 MG/ML IV SOLN
200.0000 mg | Freq: Once | INTRAVENOUS | Status: AC
  Administered 2020-02-15: 200 mg via INTRAVENOUS
  Filled 2020-02-15: qty 10

## 2020-02-15 NOTE — Progress Notes (Signed)
Hematology/Oncology  note Leslie Olsen Telephone:([phone] [phone]   Patient Care Team: [place] as PCP - General Lucilla Lame, MD as Consulting Physician (Gastroenterology) Anabel Bene, MD as Referring Physician (Neurology) Theodore Demark, RN as Oncology Nurse Navigator Earlie Server, MD as Consulting Physician (Oncology)  REFERRING PROVIDER:  CHIEF COMPLAINTS/REASON FOR VISIT:  Evaluation for iron deficiency anemia  HISTORY OF PRESENTING ILLNESS:  Leslie Olsen is a  69 y.o.  female with PMH listed below who was referred to me for evaluation of breast cancer  Patient had screening mammogram on 10/24/2019 and mammogram showed a possible asymmetry in the left which warrants further evaluation.  Diagnostic left breast and ultrasound was done 32/2021 and showed suspicious mass 7x4x52m with possible associated distortion in the left breast at 5o'clock.  No evidence of left axillary lymphadenopathy. Left breast mass was biopsied and pathology showed: Invasive mammary carcinoma no special type, DCIS present, intermediate grade.  ER 91-100%, PR 81-90%, HER2 negative  Nipple discharge: Denies Menarche 69years old Age of first live birth was 89Hysterectomy at 69years old  Family history: Maternal aunt with breast cancer at age of 580  Brother has lung cancer OCP use: denies.  Estrogen and progesterone therapy: 2 years of hormone replacement in her late 497s   History of radiation to chest: denies.  She has a history of radioactive iodine ablation Previous breast surgery: History of breast augmentation History of left breast biopsy in 1970 She denies any nipple discharge. Patient was seen by Surgery Dr.Sakai. She is on dural antiplatelet therapy due to drug eluting stent that was placed in November 2020. Cardiology does not clear patient to be off antiplatelet therapy due to risk of thrombosis.  Patient does not like the idea of  neoadjuvant endocrine therapy.  [facility] She does not like neoadjuvant endocrine therapy and [facility]  01/19/2020 she underwent left breast partial mastectomy, pathology showed invasive adenocarcinoma, pT1b pNx , DCIS present, margin negative. ER+, PR + HER2 -. Patient underwent adjuvant radiation.  [facility] not recommend adjuvant end[facility]in[facility] due to her risk.     INTERVAL HISTORY DTAUNJA BRICKNERis a 69y.o. female who has above history reviewed by me today referred by primary care provider to me for evaluation of anemia.  Problems and complaints are listed below:  She continues to feel very tired and fatigued. Multiple joint pain.  She has received 3 doses of IV venofer treatments . She does not feel much improved.  She has a history of bleeding AVMs status post cauterization.    Review of Systems  Constitutional: Positive for fatigue. Negative for appetite change, chills and fever.  HENT:   Negative for hearing loss and voice change.   Eyes: Negative for eye problems.  Respiratory: Negative for chest tightness, cough and shortness of breath.   Cardiovascular: Negative for chest pain.  Gastrointestinal: Negative for abdominal distention, abdominal pain and blood in stool.  Endocrine: Negative for hot flashes.  Genitourinary: Negative for difficulty urinating and frequency.   Musculoskeletal: Positive for arthralgias.  Skin: Negative for itching and rash.  Neurological: Negative for extremity weakness.  Hematological: Negative for adenopathy.  Psychiatric/Behavioral: Negative for confusion.    MEDICAL HISTORY:  Past Medical History:  Diagnosis Date  . Anemia   . Cancer ([facility]    Breast cancer   . Cephalalgia 04/08/2015  .  Chronic headaches   . COPD (chronic obstructive pulmonary disease) ([place])   . Diabetes mellitus without complication ([place])   . Fatigue due to excessive exertion  08/22/2018  . GERD (gastroesophageal reflux disease)   . Hypertension   . Hypothyroidism   . [facility] spotted fever) 07/27/2016  . Sleep apnea     SURGICAL HISTORY: Past Surgical History:  Procedure Laterality Date  . ABDOMINAL HYSTERECTOMY    . BACK SURGERY    . BREAST BIOPSY Left 11/15/2019   [place] bx , heart marker   , path pending  . BREAST EXCISIONAL BIOPSY Left 1970's  . CHOLECYSTECTOMY    . COLONOSCOPY N/A 11/05/2019   Procedure: COLONOSCOPY;  Surgeon: Toledo, Benay Pike, MD;  Location: [facility] ENDOSCOPY;  Service: Gastroenterology;  Laterality: N/A;  . CORONARY STENT INTERVENTION N/A 07/31/2019   Procedure: CORONARY STENT INTERVENTION;  Surgeon: Isaias Cowman, MD;  Location: [place] CV LAB;  Service: Cardiovascular;  Laterality: N/A;  RCA  . CORONARY STENT PLACEMENT    . ESOPHAGOGASTRODUODENOSCOPY N/A 11/05/2019   Procedure: ESOPHAGOGASTRODUODENOSCOPY (EGD);  Surgeon: Toledo, Benay Pike, MD;  Location: [facility] ENDOSCOPY;  Service: Gastroenterology;  Laterality: N/A;  . ESOPHAGOGASTRODUODENOSCOPY (EGD) WITH PROPOFOL N/A 05/26/2016   Procedure: ESOPHAGOGASTRODUODENOSCOPY (EGD) WITH PROPOFOL;  Surgeon: Lucilla Lame, MD;  Location: [facility] ENDOSCOPY;  Service: Endoscopy;  Laterality: N/A;  . GIVENS CAPSULE STUDY N/A 05/05/2016   Procedure: GIVENS CAPSULE STUDY;  Surgeon: Lucilla Lame, MD;  Location: [facility] ENDOSCOPY;  Service: Endoscopy;  Laterality: N/A;  . JOINT REPLACEMENT    . LEFT HEART CATH AND CORONARY ANGIOGRAPHY Left 07/31/2019   Procedure: LEFT HEART CATH AND CORONARY ANGIOGRAPHY;  Surgeon: Isaias Cowman, MD;  Location: [place] CV LAB;  Service: Cardiovascular;  Laterality: Left;  . REDUCTION MAMMAPLASTY    . REPLACEMENT TOTAL KNEE    . SHOULDER SURGERY    . TIBIA FRACTURE SURGERY      SOCIAL HISTORY: Social History   Socioeconomic History  . Marital status: Divorced    Spouse name: Not on file  . Number of children: 1  . Years of education:  52  . Highest education level: 12th grade  Occupational History  . Occupation: retired   Tobacco Use  . Smoking status: Former Smoker    Packs/day: 2.00    Years: 45.00    Pack years: 90.00    Types: Cigarettes    Quit date: 11/06/2011    Years since quitting: 8.2  . Smokeless tobacco: Never Used  Vaping Use  . Vaping Use: Never used  Substance and Sexual Activity  . Alcohol use: No  . Drug use: No  . Sexual activity: Never  Other Topics Concern  . Not on file  Social History Narrative   Lives alone.  She has one grown son.   She works as a Retail buyer at [employer]   Highest level of education was high school   Social Determinants of Health   Financial Resource Strain:   . Difficulty of Paying Living Expenses:   Food Insecurity:   . Worried About Charity fundraiser in the Last Year:   . Arboriculturist in the Last Year:   Transportation Needs:   . Film/video editor (Medical):   Marland Kitchen Lack of Transportation (Non-Medical):   Physical Activity:   . Days of Exercise per Week:   . Minutes of Exercise per Session:   Stress:   . Feeling of Stress :   Social Connections:   .  Frequency of Communication with Friends and Family:   . Frequency of Social Gatherings with Friends and Family:   . Attends Religious Services:   . Active Member of Clubs or Organizations:   . Attends Archivist Meetings:   Marland Kitchen Marital Status:   Intimate Partner Violence:   . Fear of Current or Ex-Partner:   . Emotionally Abused:   Marland Kitchen Physically Abused:   . Sexually Abused:     FAMILY HISTORY: Family History  Problem Relation Age of Onset  . Lung cancer Maternal Aunt   . Breast cancer Maternal Aunt 57  . Other Mother        Killed in car accident, 23  . Other Father        Killed in car accident, 76  . Thyroid disease Brother   . Thyroid disease Sister     ALLERGIES:  has No Known Allergies.  MEDICATIONS:  Current Outpatient Medications  Medication Sig Dispense Refill  .  aspirin 81 MG tablet Take 81 mg by mouth daily.    . Cholecalciferol (VITAMIN D) 50 MCG (2000 UT) CAPS Take 2,000 Units by mouth daily.     . clonazePAM (KLONOPIN) 0.5 MG tablet Take 0.5 mg by mouth 2 (two) times daily.     Marland Kitchen docusate sodium (COLACE) 100 MG capsule Take 100 mg by mouth 2 (two) times daily.    . ferrous gluconate (FERGON) 324 MG tablet Take 324 mg by mouth daily.  11  . glucose blood (ONE TOUCH ULTRA TEST) test strip USE ONE STRIP TO CHECK GLUCOSE TWICE DAILY 100 each 12  . levETIRAcetam (KEPPRA) 500 MG tablet Take 500 mg by mouth daily.     Marland Kitchen lisinopril (ZESTRIL) 10 MG tablet Take 1 tablet (10 mg total) by mouth daily. 30 tablet 0  . metFORMIN (GLUCOPHAGE) 500 MG tablet Take 1 tablet (500 mg total) by mouth daily with breakfast. (Patient taking differently: Take 1,000 mg by mouth 2 (two) times daily with a meal. ) 90 tablet 1  . ondansetron (ZOFRAN-ODT) 4 MG disintegrating tablet Take 4 mg by mouth every 8 (eight) hours as needed.    . pantoprazole (PROTONIX) 40 MG tablet Take 40 mg by mouth daily as needed (Heartburn).   3  . rosuvastatin (CRESTOR) 5 MG tablet Take 5 mg by mouth daily.    . SUMAtriptan (IMITREX) 100 MG tablet Take 1 tablet (100 mg total) by mouth every 2 (two) hours as needed for migraine. May repeat in 2 hours if headache persists or recurs. 10 tablet 12  . SYNTHROID 100 MCG tablet Take 1 tablet (100 mcg total) by mouth daily before breakfast. 90 tablet 3  . rifaximin (XIFAXAN) 550 MG TABS tablet Take 1 tablet (550 mg total) by mouth 3 (three) times daily. (Patient not taking: Reported on 02/15/2020) 42 tablet 0   No current facility-administered medications for this visit.     PHYSICAL EXAMINATION: ECOG PERFORMANCE STATUS: 0 - Asymptomatic Vitals:   02/15/20 1057  BP: 135/79  Pulse: 82  Temp: (!) 96.4 F (35.8 C)  SpO2: 98%   Filed Weights   02/15/20 1057  Weight: 186 lb 8 oz (84.6 kg)   Physical Exam Constitutional:      General: She is not in  acute distress.    Appearance: She is not diaphoretic.  HENT:     Head: Normocephalic and atraumatic.     Nose: Nose normal.     Mouth/Throat:     Pharynx: No oropharyngeal  exudate.  Eyes:     General: No scleral icterus.    Pupils: Pupils are equal, round, and reactive to light.  Cardiovascular:     Rate and Rhythm: Normal rate and regular rhythm.     Heart sounds: No murmur heard.   Pulmonary:     Effort: Pulmonary effort is normal. No respiratory distress.     Breath sounds: No rales.  Chest:     Chest wall: No tenderness.  Abdominal:     General: There is no distension.     Palpations: Abdomen is soft.     Tenderness: There is no abdominal tenderness.  Musculoskeletal:        General: Normal range of motion.     Cervical back: Normal range of motion and neck supple.  Skin:    General: Skin is warm and dry.     Findings: No erythema.  Neurological:     Mental Status: She is alert and oriented to person, place, and time.     Cranial Nerves: No cranial nerve deficit.     Motor: No abnormal muscle tone.     Coordination: Coordination normal.  Psychiatric:        Mood and Affect: Affect normal.       LABORATORY DATA:  I have reviewed the data as listed Lab Results  Component Value Date   WBC 6.3 02/13/2020   HGB 10.8 (L) 02/13/2020   HCT 33.9 (L) 02/13/2020   MCV 88.7 02/13/2020   PLT 266 02/13/2020   Recent Labs    11/22/19 1158 01/03/20 1059 02/13/20 1330  NA 137 140 139  K 5.4* 4.8 5.2*  CL 103 106 104  CO2 25 25 26   GLUCOSE 130* 125* 92  BUN 23 22 30*  CREATININE 1.21* 1.07* 1.06*  CALCIUM 9.8 9.2 9.8  GFRNONAA 45* 53* 53*  GFRAA 52* >60 >60  PROT 8.2* 7.3 8.4*  ALBUMIN 4.6 3.9 4.5  AST 20 18 21   ALT 16 13 17   ALKPHOS 81 64 78  BILITOT 0.5 0.4 0.5   Iron/TIBC/Ferritin/ %Sat    Component Value Date/Time   IRON 86 02/13/2020 1330   IRON 17 (L) 11/02/2019 1008   IRON 48 12/13/2014 1455   TIBC 395 02/13/2020 1330   TIBC 353 11/02/2019  1008   TIBC 378 12/13/2014 1455   FERRITIN 24 02/13/2020 1330   FERRITIN 15 11/02/2019 1008   FERRITIN 11 12/13/2014 1455   IRONPCTSAT 22 02/13/2020 1330   IRONPCTSAT 5 (LL) 11/02/2019 1008   IRONPCTSAT 12.7 12/13/2014 1455        ASSESSMENT & PLAN:  1. Iron deficiency anemia due to chronic blood loss   2. Stage 3a chronic kidney disease   3. Invasive carcinoma of breast ([place])   Cancer Staging Invasive carcinoma of breast ([place]) Staging form: Breast, AJCC 8th Edition - Clinical stage from 11/24/2019: Stage IA (cT1b, cN0, cM0, G1, ER+, PR+, HER2-) - Signed by Earlie Server, MD on 11/24/2019  #Iron deficiency anemia Labs are reviewed and discussed with patient. hemogloin has improved to 10.8.  Ferritin has improved to 24, iron saturation is 22.  In the context of CKD, I recommend additional IV venofer treatments to further increase her iron store.  Plan IV venofer 28m x 1, and repeat another dose in 2 weeks.  Advise patient to follow up with Dr.Wohl to schedule small bowel capsule study.   Invasive breast cancer, stage IA: [facility] for [facility].   Stage IIIA CKD Check multiple myeloma panel, light chain ratio.  Hyperkalemia, K is borderline elevated. Recommend her to decreased potassium rich food intake. Follow up with PCP.    All questions were answered. The patient knows to call the clinic with any problems questions or concerns.  Return of visit: 8 weeks    Earlie Server, MD, PhD Hematology Oncology [facility] Pager- [phone] 02/15/2020

## 2020-02-15 NOTE — Progress Notes (Signed)
Pt here for follow up. Pt continues to feel fatigued. Pt concerned why hemoglobin is still low.

## 2020-02-16 LAB — KAPPA/LAMBDA LIGHT CHAINS
Kappa free light chain: 57.9 mg/L — ABNORMAL HIGH (ref 3.3–19.4)
Kappa, lambda light chain ratio: 2.68 — ABNORMAL HIGH (ref 0.26–1.65)
Lambda free light chains: 21.6 mg/L (ref 5.7–26.3)

## 2020-02-19 LAB — MULTIPLE MYELOMA PANEL, SERUM
Albumin SerPl Elph-Mcnc: 4.1 g/dL (ref 2.9–4.4)
Albumin/Glob SerPl: 1.2 (ref 0.7–1.7)
Alpha 1: 0.2 g/dL (ref 0.0–0.4)
Alpha2 Glob SerPl Elph-Mcnc: 0.8 g/dL (ref 0.4–1.0)
B-Globulin SerPl Elph-Mcnc: 1.2 g/dL (ref 0.7–1.3)
Gamma Glob SerPl Elph-Mcnc: 1.4 g/dL (ref 0.4–1.8)
Globulin, Total: 3.6 g/dL (ref 2.2–3.9)
IgA: 268 mg/dL (ref 87–352)
IgG (Immunoglobin G), Serum: 1379 mg/dL (ref 586–1602)
IgM (Immunoglobulin M), Srm: 129 mg/dL (ref 26–217)
Total Protein ELP: 7.7 g/dL (ref 6.0–8.5)

## 2020-02-20 ENCOUNTER — Telehealth: Payer: Self-pay | Admitting: *Deleted

## 2020-02-20 DIAGNOSIS — R399 Unspecified symptoms and signs involving the genitourinary system: Secondary | ICD-10-CM | POA: Diagnosis not present

## 2020-02-20 NOTE — Telephone Encounter (Signed)
Dr. Tasia Catchings, what was the reason for checking MM panel and light chains?

## 2020-02-20 NOTE — Telephone Encounter (Signed)
Patient called and states she is confused by the results of the labs that were added on, light chains and MM panel. She would like a return call to find out how all of this is related to her health. Her next follow up apt is not until August and she does not want to wait until then to find out

## 2020-02-21 DIAGNOSIS — C50919 Malignant neoplasm of unspecified site of unspecified female breast: Principal | ICD-10-CM

## 2020-02-21 DIAGNOSIS — Z87891 Personal history of nicotine dependence: Principal | ICD-10-CM

## 2020-02-21 DIAGNOSIS — I2510 Atherosclerotic heart disease of native coronary artery without angina pectoris: Principal | ICD-10-CM

## 2020-02-21 DIAGNOSIS — I10 Essential (primary) hypertension: Principal | ICD-10-CM

## 2020-02-21 DIAGNOSIS — J449 Chronic obstructive pulmonary disease, unspecified: Principal | ICD-10-CM

## 2020-02-21 DIAGNOSIS — C50812 Malignant neoplasm of overlapping sites of left female breast: Principal | ICD-10-CM

## 2020-02-21 DIAGNOSIS — E785 Hyperlipidemia, unspecified: Principal | ICD-10-CM

## 2020-02-21 DIAGNOSIS — E119 Type 2 diabetes mellitus without complications: Principal | ICD-10-CM

## 2020-02-21 DIAGNOSIS — Z170 Estrogen receptor positive status [ER+]: Principal | ICD-10-CM

## 2020-02-21 NOTE — Telephone Encounter (Signed)
Please let patient know that multiple myeloma panel is normal.  Light chain ratio is slightly high, which is very common in patient with some chronic kidney insufficiency.  These labs were ordered to look for other etiologies of anemia.  This blood work does not reveal other etiologies.  No change of her plan.  Thank you

## 2020-02-22 ENCOUNTER — Ambulatory Visit: Admit: 2020-02-22 | Discharge: 2020-02-23 | Payer: PRIVATE HEALTH INSURANCE | Attending: Neurology | Primary: Neurology

## 2020-02-22 DIAGNOSIS — G253 Myoclonus: Secondary | ICD-10-CM | POA: Diagnosis not present

## 2020-02-22 DIAGNOSIS — R499 Unspecified voice and resonance disorder: Secondary | ICD-10-CM | POA: Diagnosis not present

## 2020-02-22 DIAGNOSIS — Z6830 Body mass index (BMI) 30.0-30.9, adult: Secondary | ICD-10-CM | POA: Diagnosis not present

## 2020-02-22 MED ORDER — LEVETIRACETAM 500 MG TABLET
ORAL_TABLET | Freq: Two times a day (BID) | ORAL | 1 refills | 90.00000 days | Status: CP
Start: 2020-02-22 — End: ?

## 2020-02-22 NOTE — Telephone Encounter (Signed)
Patient notified

## 2020-02-23 ENCOUNTER — Encounter: Payer: Self-pay | Admitting: Gastroenterology

## 2020-02-28 DIAGNOSIS — E785 Hyperlipidemia, unspecified: Secondary | ICD-10-CM | POA: Diagnosis not present

## 2020-02-28 DIAGNOSIS — Z87891 Personal history of nicotine dependence: Secondary | ICD-10-CM | POA: Diagnosis not present

## 2020-02-28 DIAGNOSIS — C50919 Malignant neoplasm of unspecified site of unspecified female breast: Secondary | ICD-10-CM | POA: Diagnosis not present

## 2020-02-28 DIAGNOSIS — I2510 Atherosclerotic heart disease of native coronary artery without angina pectoris: Secondary | ICD-10-CM | POA: Diagnosis not present

## 2020-02-28 DIAGNOSIS — E119 Type 2 diabetes mellitus without complications: Secondary | ICD-10-CM | POA: Diagnosis not present

## 2020-02-28 DIAGNOSIS — I10 Essential (primary) hypertension: Secondary | ICD-10-CM | POA: Diagnosis not present

## 2020-02-28 DIAGNOSIS — J449 Chronic obstructive pulmonary disease, unspecified: Secondary | ICD-10-CM | POA: Diagnosis not present

## 2020-02-28 DIAGNOSIS — Z170 Estrogen receptor positive status [ER+]: Secondary | ICD-10-CM | POA: Diagnosis not present

## 2020-02-28 DIAGNOSIS — C50812 Malignant neoplasm of overlapping sites of left female breast: Secondary | ICD-10-CM | POA: Diagnosis not present

## 2020-02-29 ENCOUNTER — Other Ambulatory Visit: Payer: Self-pay

## 2020-02-29 ENCOUNTER — Inpatient Hospital Stay: Payer: PPO

## 2020-02-29 VITALS — BP 119/67 | HR 66 | Temp 98.4°F | Resp 16

## 2020-02-29 DIAGNOSIS — D500 Iron deficiency anemia secondary to blood loss (chronic): Secondary | ICD-10-CM

## 2020-02-29 DIAGNOSIS — D509 Iron deficiency anemia, unspecified: Secondary | ICD-10-CM | POA: Diagnosis not present

## 2020-02-29 MED ORDER — SODIUM CHLORIDE 0.9 % IV SOLN
Freq: Once | INTRAVENOUS | Status: AC
  Filled 2020-02-29: qty 250

## 2020-02-29 MED ORDER — IRON SUCROSE 20 MG/ML IV SOLN
200.0000 mg | Freq: Once | INTRAVENOUS | Status: AC
  Administered 2020-02-29: 200 mg via INTRAVENOUS
  Filled 2020-02-29: qty 10

## 2020-03-06 DIAGNOSIS — C50812 Malignant neoplasm of overlapping sites of left female breast: Principal | ICD-10-CM

## 2020-03-06 DIAGNOSIS — J449 Chronic obstructive pulmonary disease, unspecified: Principal | ICD-10-CM

## 2020-03-06 DIAGNOSIS — C50919 Malignant neoplasm of unspecified site of unspecified female breast: Principal | ICD-10-CM

## 2020-03-06 DIAGNOSIS — I10 Essential (primary) hypertension: Principal | ICD-10-CM

## 2020-03-06 DIAGNOSIS — Z170 Estrogen receptor positive status [ER+]: Principal | ICD-10-CM

## 2020-03-06 DIAGNOSIS — I2510 Atherosclerotic heart disease of native coronary artery without angina pectoris: Principal | ICD-10-CM

## 2020-03-06 DIAGNOSIS — E785 Hyperlipidemia, unspecified: Principal | ICD-10-CM

## 2020-03-06 DIAGNOSIS — Z87891 Personal history of nicotine dependence: Principal | ICD-10-CM

## 2020-03-06 DIAGNOSIS — E119 Type 2 diabetes mellitus without complications: Principal | ICD-10-CM

## 2020-03-07 ENCOUNTER — Encounter
Admit: 2020-03-07 | Discharge: 2020-04-06 | Payer: PRIVATE HEALTH INSURANCE | Attending: Radiation Oncology | Primary: Radiation Oncology

## 2020-03-07 ENCOUNTER — Ambulatory Visit: Admit: 2020-03-07 | Discharge: 2020-04-06 | Payer: PRIVATE HEALTH INSURANCE

## 2020-03-07 DIAGNOSIS — Z170 Estrogen receptor positive status [ER+]: Secondary | ICD-10-CM | POA: Diagnosis not present

## 2020-03-07 DIAGNOSIS — C50812 Malignant neoplasm of overlapping sites of left female breast: Secondary | ICD-10-CM | POA: Diagnosis not present

## 2020-03-07 DIAGNOSIS — C50919 Malignant neoplasm of unspecified site of unspecified female breast: Secondary | ICD-10-CM | POA: Diagnosis not present

## 2020-03-07 DIAGNOSIS — I2510 Atherosclerotic heart disease of native coronary artery without angina pectoris: Secondary | ICD-10-CM | POA: Diagnosis not present

## 2020-03-07 DIAGNOSIS — I10 Essential (primary) hypertension: Secondary | ICD-10-CM | POA: Diagnosis not present

## 2020-03-07 DIAGNOSIS — E785 Hyperlipidemia, unspecified: Secondary | ICD-10-CM | POA: Diagnosis not present

## 2020-03-07 DIAGNOSIS — Z87891 Personal history of nicotine dependence: Secondary | ICD-10-CM | POA: Diagnosis not present

## 2020-03-07 DIAGNOSIS — J449 Chronic obstructive pulmonary disease, unspecified: Secondary | ICD-10-CM | POA: Diagnosis not present

## 2020-03-07 DIAGNOSIS — E119 Type 2 diabetes mellitus without complications: Secondary | ICD-10-CM | POA: Diagnosis not present

## 2020-03-08 DIAGNOSIS — C50812 Malignant neoplasm of overlapping sites of left female breast: Secondary | ICD-10-CM | POA: Diagnosis not present

## 2020-03-12 DIAGNOSIS — C50812 Malignant neoplasm of overlapping sites of left female breast: Secondary | ICD-10-CM | POA: Diagnosis not present

## 2020-03-13 DIAGNOSIS — I2510 Atherosclerotic heart disease of native coronary artery without angina pectoris: Principal | ICD-10-CM

## 2020-03-13 DIAGNOSIS — Z170 Estrogen receptor positive status [ER+]: Principal | ICD-10-CM

## 2020-03-13 DIAGNOSIS — J449 Chronic obstructive pulmonary disease, unspecified: Principal | ICD-10-CM

## 2020-03-13 DIAGNOSIS — I10 Essential (primary) hypertension: Principal | ICD-10-CM

## 2020-03-13 DIAGNOSIS — E119 Type 2 diabetes mellitus without complications: Principal | ICD-10-CM

## 2020-03-13 DIAGNOSIS — C50919 Malignant neoplasm of unspecified site of unspecified female breast: Principal | ICD-10-CM

## 2020-03-13 DIAGNOSIS — E785 Hyperlipidemia, unspecified: Principal | ICD-10-CM

## 2020-03-13 DIAGNOSIS — Z87891 Personal history of nicotine dependence: Principal | ICD-10-CM

## 2020-03-13 DIAGNOSIS — C50812 Malignant neoplasm of overlapping sites of left female breast: Secondary | ICD-10-CM | POA: Diagnosis not present

## 2020-03-14 DIAGNOSIS — C50812 Malignant neoplasm of overlapping sites of left female breast: Secondary | ICD-10-CM | POA: Diagnosis not present

## 2020-03-15 DIAGNOSIS — C50812 Malignant neoplasm of overlapping sites of left female breast: Secondary | ICD-10-CM | POA: Diagnosis not present

## 2020-03-18 DIAGNOSIS — C50812 Malignant neoplasm of overlapping sites of left female breast: Secondary | ICD-10-CM | POA: Diagnosis not present

## 2020-03-19 DIAGNOSIS — C50812 Malignant neoplasm of overlapping sites of left female breast: Secondary | ICD-10-CM | POA: Diagnosis not present

## 2020-03-20 DIAGNOSIS — J449 Chronic obstructive pulmonary disease, unspecified: Principal | ICD-10-CM

## 2020-03-20 DIAGNOSIS — Z170 Estrogen receptor positive status [ER+]: Principal | ICD-10-CM

## 2020-03-20 DIAGNOSIS — C50919 Malignant neoplasm of unspecified site of unspecified female breast: Principal | ICD-10-CM

## 2020-03-20 DIAGNOSIS — E785 Hyperlipidemia, unspecified: Principal | ICD-10-CM

## 2020-03-20 DIAGNOSIS — E119 Type 2 diabetes mellitus without complications: Principal | ICD-10-CM

## 2020-03-20 DIAGNOSIS — I10 Essential (primary) hypertension: Principal | ICD-10-CM

## 2020-03-20 DIAGNOSIS — C50812 Malignant neoplasm of overlapping sites of left female breast: Principal | ICD-10-CM

## 2020-03-20 DIAGNOSIS — I2510 Atherosclerotic heart disease of native coronary artery without angina pectoris: Principal | ICD-10-CM

## 2020-03-20 DIAGNOSIS — Z87891 Personal history of nicotine dependence: Principal | ICD-10-CM

## 2020-03-20 MED ORDER — CLONAZEPAM 0.5 MG TABLET
ORAL_TABLET | Freq: Two times a day (BID) | ORAL | 0 refills | 90 days | Status: CP | PRN
Start: 2020-03-20 — End: 2020-06-18

## 2020-03-21 DIAGNOSIS — C50812 Malignant neoplasm of overlapping sites of left female breast: Secondary | ICD-10-CM | POA: Diagnosis not present

## 2020-03-22 DIAGNOSIS — C50812 Malignant neoplasm of overlapping sites of left female breast: Secondary | ICD-10-CM | POA: Diagnosis not present

## 2020-03-25 DIAGNOSIS — C50812 Malignant neoplasm of overlapping sites of left female breast: Secondary | ICD-10-CM | POA: Diagnosis not present

## 2020-03-26 DIAGNOSIS — C50812 Malignant neoplasm of overlapping sites of left female breast: Secondary | ICD-10-CM | POA: Diagnosis not present

## 2020-03-27 DIAGNOSIS — E119 Type 2 diabetes mellitus without complications: Principal | ICD-10-CM

## 2020-03-27 DIAGNOSIS — I2510 Atherosclerotic heart disease of native coronary artery without angina pectoris: Principal | ICD-10-CM

## 2020-03-27 DIAGNOSIS — C50812 Malignant neoplasm of overlapping sites of left female breast: Principal | ICD-10-CM

## 2020-03-27 DIAGNOSIS — C50919 Malignant neoplasm of unspecified site of unspecified female breast: Principal | ICD-10-CM

## 2020-03-27 DIAGNOSIS — I10 Essential (primary) hypertension: Principal | ICD-10-CM

## 2020-03-27 DIAGNOSIS — J449 Chronic obstructive pulmonary disease, unspecified: Principal | ICD-10-CM

## 2020-03-27 DIAGNOSIS — Z170 Estrogen receptor positive status [ER+]: Principal | ICD-10-CM

## 2020-03-27 DIAGNOSIS — Z87891 Personal history of nicotine dependence: Principal | ICD-10-CM

## 2020-03-27 DIAGNOSIS — E785 Hyperlipidemia, unspecified: Principal | ICD-10-CM

## 2020-03-28 DIAGNOSIS — C50812 Malignant neoplasm of overlapping sites of left female breast: Secondary | ICD-10-CM | POA: Diagnosis not present

## 2020-04-09 ENCOUNTER — Ambulatory Visit
Admit: 2020-04-09 | Discharge: 2020-04-10 | Payer: PRIVATE HEALTH INSURANCE | Attending: Cardiovascular Disease | Primary: Cardiovascular Disease

## 2020-04-09 DIAGNOSIS — E038 Other specified hypothyroidism: Principal | ICD-10-CM

## 2020-04-09 DIAGNOSIS — I2510 Atherosclerotic heart disease of native coronary artery without angina pectoris: Principal | ICD-10-CM

## 2020-04-09 DIAGNOSIS — I10 Essential (primary) hypertension: Secondary | ICD-10-CM | POA: Diagnosis not present

## 2020-04-09 DIAGNOSIS — E119 Type 2 diabetes mellitus without complications: Secondary | ICD-10-CM | POA: Diagnosis not present

## 2020-04-09 DIAGNOSIS — Z6830 Body mass index (BMI) 30.0-30.9, adult: Secondary | ICD-10-CM | POA: Diagnosis not present

## 2020-04-11 MED ORDER — LISINOPRIL 20 MG TABLET
ORAL_TABLET | Freq: Every day | ORAL | 3 refills | 90.00000 days | Status: CP
Start: 2020-04-11 — End: ?

## 2020-04-12 ENCOUNTER — Inpatient Hospital Stay: Payer: PPO | Attending: Oncology

## 2020-04-12 ENCOUNTER — Other Ambulatory Visit: Payer: Self-pay

## 2020-04-12 ENCOUNTER — Encounter: Payer: Self-pay | Admitting: Emergency Medicine

## 2020-04-12 DIAGNOSIS — N1831 Chronic kidney disease, stage 3a: Secondary | ICD-10-CM | POA: Diagnosis not present

## 2020-04-12 DIAGNOSIS — Z853 Personal history of malignant neoplasm of breast: Secondary | ICD-10-CM | POA: Insufficient documentation

## 2020-04-12 DIAGNOSIS — J432 Centrilobular emphysema: Secondary | ICD-10-CM | POA: Diagnosis not present

## 2020-04-12 DIAGNOSIS — Z79899 Other long term (current) drug therapy: Secondary | ICD-10-CM | POA: Diagnosis not present

## 2020-04-12 DIAGNOSIS — J449 Chronic obstructive pulmonary disease, unspecified: Secondary | ICD-10-CM | POA: Insufficient documentation

## 2020-04-12 DIAGNOSIS — I700 Atherosclerosis of aorta: Secondary | ICD-10-CM | POA: Diagnosis not present

## 2020-04-12 DIAGNOSIS — D631 Anemia in chronic kidney disease: Secondary | ICD-10-CM | POA: Insufficient documentation

## 2020-04-12 DIAGNOSIS — I2510 Atherosclerotic heart disease of native coronary artery without angina pectoris: Secondary | ICD-10-CM | POA: Insufficient documentation

## 2020-04-12 DIAGNOSIS — R55 Syncope and collapse: Principal | ICD-10-CM | POA: Insufficient documentation

## 2020-04-12 DIAGNOSIS — Z87891 Personal history of nicotine dependence: Secondary | ICD-10-CM | POA: Insufficient documentation

## 2020-04-12 DIAGNOSIS — C50919 Malignant neoplasm of unspecified site of unspecified female breast: Secondary | ICD-10-CM | POA: Diagnosis not present

## 2020-04-12 DIAGNOSIS — I119 Hypertensive heart disease without heart failure: Secondary | ICD-10-CM | POA: Diagnosis not present

## 2020-04-12 DIAGNOSIS — D509 Iron deficiency anemia, unspecified: Secondary | ICD-10-CM | POA: Insufficient documentation

## 2020-04-12 DIAGNOSIS — Z7984 Long term (current) use of oral hypoglycemic drugs: Secondary | ICD-10-CM | POA: Insufficient documentation

## 2020-04-12 DIAGNOSIS — I213 ST elevation (STEMI) myocardial infarction of unspecified site: Secondary | ICD-10-CM | POA: Diagnosis not present

## 2020-04-12 DIAGNOSIS — E039 Hypothyroidism, unspecified: Secondary | ICD-10-CM | POA: Insufficient documentation

## 2020-04-12 DIAGNOSIS — Z7982 Long term (current) use of aspirin: Secondary | ICD-10-CM | POA: Diagnosis not present

## 2020-04-12 DIAGNOSIS — E1129 Type 2 diabetes mellitus with other diabetic kidney complication: Secondary | ICD-10-CM | POA: Diagnosis not present

## 2020-04-12 DIAGNOSIS — Z20822 Contact with and (suspected) exposure to covid-19: Secondary | ICD-10-CM | POA: Diagnosis not present

## 2020-04-12 DIAGNOSIS — I10 Essential (primary) hypertension: Secondary | ICD-10-CM | POA: Diagnosis not present

## 2020-04-12 DIAGNOSIS — E785 Hyperlipidemia, unspecified: Secondary | ICD-10-CM | POA: Diagnosis not present

## 2020-04-12 DIAGNOSIS — D500 Iron deficiency anemia secondary to blood loss (chronic): Secondary | ICD-10-CM

## 2020-04-12 LAB — COMPREHENSIVE METABOLIC PANEL
ALT: 14 U/L (ref 0–44)
AST: 19 U/L (ref 15–41)
Albumin: 4.2 g/dL (ref 3.5–5.0)
Alkaline Phosphatase: 71 U/L (ref 38–126)
Anion gap: 8 (ref 5–15)
BUN: 29 mg/dL — ABNORMAL HIGH (ref 8–23)
CO2: 28 mmol/L (ref 22–32)
Calcium: 9.6 mg/dL (ref 8.9–10.3)
Chloride: 104 mmol/L (ref 98–111)
Creatinine, Ser: 1.24 mg/dL — ABNORMAL HIGH (ref 0.44–1.00)
GFR calc Af Amer: 51 mL/min — ABNORMAL LOW (ref >60)
GFR calc non Af Amer: 44 mL/min — ABNORMAL LOW (ref >60)
Glucose, Bld: 133 mg/dL — ABNORMAL HIGH (ref 70–99)
Potassium: 4.6 mmol/L (ref 3.5–5.1)
Sodium: 140 mmol/L (ref 135–145)
Total Bilirubin: 0.6 mg/dL (ref 0.3–1.2)
Total Protein: 7.7 g/dL (ref 6.5–8.1)

## 2020-04-12 LAB — CBC WITH DIFFERENTIAL/PLATELET
Abs Immature Granulocytes: 0.01 10*3/uL (ref 0.00–0.07)
Basophils Absolute: 0.1 10*3/uL (ref 0.0–0.1)
Basophils Relative: 1 %
Eosinophils Absolute: 0.2 10*3/uL (ref 0.0–0.5)
Eosinophils Relative: 4 %
HCT: 33.4 % — ABNORMAL LOW (ref 36.0–46.0)
Hemoglobin: 11 g/dL — ABNORMAL LOW (ref 12.0–15.0)
Immature Granulocytes: 0 %
Lymphocytes Relative: 30 %
Lymphs Abs: 1.5 10*3/uL (ref 0.7–4.0)
MCH: 30 pg (ref 26.0–34.0)
MCHC: 32.9 g/dL (ref 30.0–36.0)
MCV: 91 fL (ref 80.0–100.0)
Monocytes Absolute: 0.4 10*3/uL (ref 0.1–1.0)
Monocytes Relative: 9 %
Neutro Abs: 2.7 10*3/uL (ref 1.7–7.7)
Neutrophils Relative %: 56 %
Platelets: 258 10*3/uL (ref 150–400)
RBC: 3.67 MIL/uL — ABNORMAL LOW (ref 3.87–5.11)
RDW: 15.4 % (ref 11.5–15.5)
WBC: 4.8 10*3/uL (ref 4.0–10.5)
nRBC: 0 % (ref 0.0–0.2)

## 2020-04-12 LAB — TROPONIN I (HIGH SENSITIVITY)
Troponin I (High Sensitivity): 3 ng/L (ref <18)
Troponin I (High Sensitivity): 5 ng/L (ref <18)

## 2020-04-12 LAB — IRON AND TIBC
Iron: 44 ug/dL (ref 28–170)
Saturation Ratios: 13 % (ref 10.4–31.8)
TIBC: 343 ug/dL (ref 250–450)
UIBC: 299 ug/dL

## 2020-04-12 LAB — FERRITIN: Ferritin: 19 ng/mL (ref 11–307)

## 2020-04-12 LAB — GLUCOSE, CAPILLARY: Glucose-Capillary: 108 mg/dL — ABNORMAL HIGH (ref 70–99)

## 2020-04-12 NOTE — ED Triage Notes (Addendum)
Pt here for near syncope. Got dizzy and diaphoretic and felt like going to pass out.  Still "does not feel right".  Saw heart doctor this week and reports he told her she needs he kidney function rechecked and that her cholesterol was very high.  Pt did have CBC this AM at cancer center, did not repeat this.  Feels weak at this time.   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

## 2020-04-13 ENCOUNTER — Observation Stay
Admit: 2020-04-13 | Discharge: 2020-04-13 | Disposition: A | Discharge status: Home / Self Care | Payer: PPO | Attending: Internal Medicine | Admitting: Internal Medicine

## 2020-04-13 ENCOUNTER — Observation Stay
Admission: EM | Admit: 2020-04-13 | Discharge: 2020-04-14 | Disposition: A | Discharge status: Home / Self Care | Payer: PPO | Attending: Internal Medicine | Admitting: Internal Medicine

## 2020-04-13 ENCOUNTER — Emergency Department: Payer: PPO

## 2020-04-13 ENCOUNTER — Encounter: Payer: Self-pay | Admitting: Radiology

## 2020-04-13 DIAGNOSIS — R55 Syncope and collapse: Secondary | ICD-10-CM

## 2020-04-13 DIAGNOSIS — E785 Hyperlipidemia, unspecified: Secondary | ICD-10-CM | POA: Diagnosis present

## 2020-04-13 DIAGNOSIS — E669 Obesity, unspecified: Secondary | ICD-10-CM | POA: Diagnosis present

## 2020-04-13 DIAGNOSIS — E039 Hypothyroidism, unspecified: Secondary | ICD-10-CM | POA: Diagnosis present

## 2020-04-13 DIAGNOSIS — J449 Chronic obstructive pulmonary disease, unspecified: Secondary | ICD-10-CM | POA: Diagnosis present

## 2020-04-13 DIAGNOSIS — I2510 Atherosclerotic heart disease of native coronary artery without angina pectoris: Secondary | ICD-10-CM | POA: Diagnosis not present

## 2020-04-13 DIAGNOSIS — I700 Atherosclerosis of aorta: Secondary | ICD-10-CM | POA: Diagnosis not present

## 2020-04-13 DIAGNOSIS — C50919 Malignant neoplasm of unspecified site of unspecified female breast: Secondary | ICD-10-CM | POA: Diagnosis not present

## 2020-04-13 DIAGNOSIS — R9431 Abnormal electrocardiogram [ECG] [EKG]: Secondary | ICD-10-CM

## 2020-04-13 DIAGNOSIS — G4733 Obstructive sleep apnea (adult) (pediatric): Secondary | ICD-10-CM

## 2020-04-13 DIAGNOSIS — E1129 Type 2 diabetes mellitus with other diabetic kidney complication: Secondary | ICD-10-CM | POA: Diagnosis present

## 2020-04-13 DIAGNOSIS — J432 Centrilobular emphysema: Secondary | ICD-10-CM | POA: Diagnosis not present

## 2020-04-13 DIAGNOSIS — I10 Essential (primary) hypertension: Secondary | ICD-10-CM | POA: Diagnosis present

## 2020-04-13 HISTORY — DX: Hyperlipidemia, unspecified: E78.5

## 2020-04-13 HISTORY — DX: Atherosclerotic heart disease of native coronary artery without angina pectoris: I25.10

## 2020-04-13 LAB — BASIC METABOLIC PANEL
Anion gap: 11 (ref 5–15)
BUN: 27 mg/dL — ABNORMAL HIGH (ref 8–23)
CO2: 23 mmol/L (ref 22–32)
Calcium: 9.8 mg/dL (ref 8.9–10.3)
Chloride: 104 mmol/L (ref 98–111)
Creatinine, Ser: 1.04 mg/dL — ABNORMAL HIGH (ref 0.44–1.00)
GFR calc Af Amer: >60 mL/min (ref >60)
GFR calc non Af Amer: 54 mL/min — ABNORMAL LOW (ref >60)
Glucose, Bld: 119 mg/dL — ABNORMAL HIGH (ref 70–99)
Potassium: 5.1 mmol/L (ref 3.5–5.1)
Sodium: 138 mmol/L (ref 135–145)

## 2020-04-13 LAB — URINALYSIS, COMPLETE (UACMP) WITH MICROSCOPIC
Bacteria, UA: NONE SEEN
Bilirubin Urine: NEGATIVE
Glucose, UA: NEGATIVE mg/dL
Hgb urine dipstick: NEGATIVE
Ketones, ur: NEGATIVE mg/dL
Leukocytes,Ua: NEGATIVE
Nitrite: NEGATIVE
Protein, ur: NEGATIVE mg/dL
Specific Gravity, Urine: 1.026 (ref 1.005–1.030)
pH: 5 (ref 5.0–8.0)

## 2020-04-13 LAB — GLUCOSE, CAPILLARY
Glucose-Capillary: 108 mg/dL — ABNORMAL HIGH (ref 70–99)
Glucose-Capillary: 118 mg/dL — ABNORMAL HIGH (ref 70–99)
Glucose-Capillary: 122 mg/dL — ABNORMAL HIGH (ref 70–99)

## 2020-04-13 LAB — TSH: TSH: 2.168 u[IU]/mL (ref 0.350–4.500)

## 2020-04-13 LAB — MAGNESIUM: Magnesium: 2.2 mg/dL (ref 1.7–2.4)

## 2020-04-13 LAB — BRAIN NATRIURETIC PEPTIDE: B Natriuretic Peptide: 36.5 pg/mL (ref 0.0–100.0)

## 2020-04-13 LAB — SARS CORONAVIRUS 2 BY RT PCR (HOSPITAL ORDER, PERFORMED IN ~~LOC~~ HOSPITAL LAB): SARS Coronavirus 2: NEGATIVE

## 2020-04-13 MED ORDER — LEVETIRACETAM 500 MG PO TABS
500.0000 mg | ORAL_TABLET | Freq: Every day | ORAL | Status: DC
  Administered 2020-04-13: 500 mg via ORAL
  Filled 2020-04-13: qty 1

## 2020-04-13 MED ORDER — IOHEXOL 350 MG/ML SOLN
60.0000 mL | Freq: Once | INTRAVENOUS | Status: AC | PRN
  Administered 2020-04-13: 60 mL via INTRAVENOUS

## 2020-04-13 MED ORDER — LACTATED RINGERS IV SOLN: INTRAVENOUS | Status: DC

## 2020-04-13 MED ORDER — LACTATED RINGERS IV BOLUS
1000.0000 mL | Freq: Once | INTRAVENOUS | Status: AC
  Administered 2020-04-13: 1000 mL via INTRAVENOUS

## 2020-04-13 MED ORDER — LEVOTHYROXINE SODIUM 50 MCG PO TABS
100.0000 ug | ORAL_TABLET | Freq: Every day | ORAL | Status: DC
  Administered 2020-04-14: 100 ug via ORAL
  Filled 2020-04-13: qty 2

## 2020-04-13 MED ORDER — CLONAZEPAM 0.5 MG PO TABS
0.5000 mg | ORAL_TABLET | Freq: Two times a day (BID) | ORAL | Status: DC
  Administered 2020-04-13 (×2): 0.5 mg via ORAL
  Filled 2020-04-13 (×2): qty 1

## 2020-04-13 MED ORDER — ASPIRIN EC 81 MG PO TBEC
81.0000 mg | DELAYED_RELEASE_TABLET | Freq: Every day | ORAL | Status: DC
  Administered 2020-04-13: 81 mg via ORAL
  Filled 2020-04-13: qty 1

## 2020-04-13 MED ORDER — LISINOPRIL 10 MG PO TABS
10.0000 mg | ORAL_TABLET | Freq: Every day | ORAL | Status: DC
  Administered 2020-04-13: 10 mg via ORAL
  Filled 2020-04-13: qty 1

## 2020-04-13 MED ORDER — ONDANSETRON HCL 4 MG PO TABS: 4.0000 mg | ORAL_TABLET | Freq: Four times a day (QID) | ORAL | Status: DC | PRN

## 2020-04-13 MED ORDER — FERROUS GLUCONATE 324 (38 FE) MG PO TABS
324.0000 mg | ORAL_TABLET | Freq: Every day | ORAL | Status: DC
  Filled 2020-04-13 (×2): qty 1

## 2020-04-13 MED ORDER — ENOXAPARIN SODIUM 40 MG/0.4ML ~~LOC~~ SOLN
40.0000 mg | SUBCUTANEOUS | Status: DC
  Administered 2020-04-13: 40 mg via SUBCUTANEOUS
  Filled 2020-04-13: qty 0.4

## 2020-04-13 MED ORDER — LISINOPRIL 10 MG PO TABS: 20.0000 mg | ORAL_TABLET | Freq: Every day | ORAL | Status: DC

## 2020-04-13 MED ORDER — ONDANSETRON HCL 4 MG/2ML IJ SOLN: 4.0000 mg | Freq: Four times a day (QID) | INTRAMUSCULAR | Status: DC | PRN

## 2020-04-13 MED ORDER — SODIUM CHLORIDE 0.9% FLUSH
3.0000 mL | Freq: Two times a day (BID) | INTRAVENOUS | Status: DC
  Administered 2020-04-14: 3 mL via INTRAVENOUS

## 2020-04-13 MED ORDER — ACETAMINOPHEN 650 MG RE SUPP: 650.0000 mg | Freq: Four times a day (QID) | RECTAL | Status: DC | PRN

## 2020-04-13 MED ORDER — ACETAMINOPHEN 325 MG PO TABS: 650.0000 mg | ORAL_TABLET | Freq: Four times a day (QID) | ORAL | Status: DC | PRN

## 2020-04-13 MED ORDER — INSULIN ASPART 100 UNIT/ML ~~LOC~~ SOLN: 0.0000 [IU] | Freq: Three times a day (TID) | SUBCUTANEOUS | Status: DC

## 2020-04-13 MED ORDER — ACETAMINOPHEN 325 MG PO TABS
650.0000 mg | ORAL_TABLET | Freq: Once | ORAL | Status: AC
  Administered 2020-04-13: 650 mg via ORAL
  Filled 2020-04-13: qty 2

## 2020-04-13 MED ORDER — ROSUVASTATIN CALCIUM 5 MG PO TABS
5.0000 mg | ORAL_TABLET | Freq: Every day | ORAL | Status: DC
  Filled 2020-04-13: qty 1

## 2020-04-13 MED ORDER — MORPHINE SULFATE (PF) 2 MG/ML IV SOLN: 2.0000 mg | INTRAVENOUS | Status: DC | PRN

## 2020-04-13 NOTE — ED Provider Notes (Addendum)
[facility] Emergency Department Provider Note  ____________________________________________   First MD Initiated Contact with Patient 04/13/20 [phone]     (approximate)  I have reviewed the triage vital signs and the nursing notes.   HISTORY  Chief Complaint Near Syncope   HPI Leslie Olsen is a 69 y.o. female with a past medical history of breast cancer not currently undergoing treatment, COPD, DM, HTN, HDL, GERD, and CAD status post drug-eluting stent not currently anticoagulated, OSA, and chronic headaches who presents for assessment of general fatigue and malaise that began yesterday associated with lightheadedness and some epigastric and substernal "fullness" the patient describes as "like I just ate a big meal but have not eaten anything".  Depression episodes.  No clear alleviating or aggravating factors.  Patient states she is compliant with her medications.  She denies any headache, earache, sore throat, vertigo, cough, shortness of breath, vomiting, diarrhea, dysuria or increased urinary frequency, blood in her urine, blood in her stool, or recent falls or injuries.  She does note she has had chronic bruising in her extremities.  Denies tobacco abuse or illicit drug use.  Does endorse remote tobacco abuse.  Denies EtOH use.         Past Medical History:  Diagnosis Date  . Anemia   . Cancer [facility])    Breast cancer   . Cephalalgia 04/08/2015  . Chronic headaches   . COPD (chronic obstructive pulmonary disease) ([place])   . Diabetes mellitus without complication ([place])   . Fatigue due to excessive exertion 08/22/2018  . GERD (gastroesophageal reflux disease)   . Hyperlipidemia   . Hypertension   . Hypothyroidism   . RMSF [facility] spotted fever) 07/27/2016  . Sleep apnea     Patient Active Problem List   Diagnosis Date Noted  . Near syncope 04/13/2020  . Invasive carcinoma of breast ([place]) 11/24/2019  . Acute upper GI bleed 11/03/2019  .  Type II diabetes mellitus with renal manifestations ([place]) 11/03/2019  . Symptomatic anemia   . Type 2 diabetes mellitus without complication, without long-term current use of insulin ([place])   . Hypothyroidism   . CAD (coronary artery disease) 07/31/2019  . 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 Coronary artery disease due to lipid rich plaque 08/22/2018  . Excessive daytime sleepiness 08/22/2018  . Type 2 diabetes mellitus with diabetic autonomic neuropathy, without long-term current use of insulin ([place]) 06/29/2018  . DNR (do not resuscitate) 09/27/2017  . Gastroparesis 02/16/2017  . Adenoma of liver 02/16/2017  . Myoclonus 02/16/2017  . Malnutrition ([place]) 02/16/2017  . Adrenal adenoma 02/16/2017  . Angiodysplasia of stomach and duodenum with hemorrhage   . Esophagitis, unspecified   . Dysphonia spastica 05/01/2016  . Thyroid activity decreased 02/21/2016  . Atherosclerosis of native coronary artery of native heart without angina pectoris 12/30/2015  . Essential hypertension, benign 12/12/2015  . Obesity 12/12/2015  . Iron deficiency anemia 10/22/2015  . Occipital neuralgia 10/11/2015  . Hyperlipidemia 07/11/2015  . Anemia 05/23/2014  . Allergic rhinitis 01/09/2014  . OSA on CPAP 01/24/2013  . Multiple pulmonary nodules 11/02/2012  . COPD (chronic obstructive pulmonary disease) ([place]) 11/01/2012    Past Surgical History:  Procedure Laterality Date  . ABDOMINAL HYSTERECTOMY    . BACK SURGERY    . BREAST BIOPSY Left 11/15/2019   [place] bx , heart marker   , path pending  . BREAST EXCISIONAL BIOPSY Left 1970's  . CHOLECYSTECTOMY    .  COLONOSCOPY N/A 11/05/2019   Procedure: COLONOSCOPY;  Surgeon: Toledo, Benay Pike, MD;  Location: [facility] ENDOSCOPY;  Service: Gastroenterology;  Laterality: N/A;  . CORONARY STENT INTERVENTION N/A 07/31/2019   Procedure: CORONARY STENT INTERVENTION;  Surgeon: Isaias Cowman, MD;  Location: [place]  CV [facility];  Service: Cardiovascular;  Laterality: N/A;  RCA  . CORONARY STENT PLACEMENT    . ESOPHAGOGASTRODUODENOSCOPY N/A 11/05/2019   Procedure: ESOPHAGOGASTRODUODENOSCOPY (EGD);  Surgeon: Toledo, Benay Pike, MD;  Location: [facility] ENDOSCOPY;  Service: Gastroenterology;  Laterality: N/A;  . ESOPHAGOGASTRODUODENOSCOPY (EGD) WITH PROPOFOL N/A 05/26/2016   Procedure: ESOPHAGOGASTRODUODENOSCOPY (EGD) WITH PROPOFOL;  Surgeon: Lucilla Lame, MD;  Location: [facility] ENDOSCOPY;  Service: Endoscopy;  Laterality: N/A;  . GIVENS CAPSULE STUDY N/A 05/05/2016   Procedure: GIVENS CAPSULE STUDY;  Surgeon: Lucilla Lame, MD;  Location: [facility] ENDOSCOPY;  Service: Endoscopy;  Laterality: N/A;  . JOINT REPLACEMENT    . LEFT HEART CATH AND CORONARY ANGIOGRAPHY Left 07/31/2019   Procedure: LEFT HEART CATH AND CORONARY ANGIOGRAPHY;  Surgeon: Isaias Cowman, MD;  Location: [place] CV LAB;  Service: Cardiovascular;  Laterality: Left;  . REDUCTION MAMMAPLASTY    . REPLACEMENT TOTAL KNEE    . SHOULDER SURGERY    . TIBIA FRACTURE SURGERY      Prior to Admission medications   Medication Sig Start Date End Date Taking? Authorizing Provider  aspirin 81 MG tablet Take 81 mg by mouth daily.    [provider]  Cholecalciferol (VITAMIN D) 50 MCG (2000 UT) CAPS Take 2,000 Units by mouth daily.     [provider]  clonazePAM (KLONOPIN) 0.5 MG tablet Take 0.5 mg by mouth 2 (two) times daily.  02/23/16   [provider]  docusate sodium (COLACE) 100 MG capsule Take 100 mg by mouth 2 (two) times daily. 01/19/20   [provider]  ferrous gluconate (FERGON) 324 MG tablet Take 324 mg by mouth daily. 05/28/18   [provider]  glucose blood (ONE TOUCH ULTRA TEST) test strip USE ONE STRIP TO CHECK GLUCOSE TWICE DAILY 10/07/18   Johnson, Megan P, DO  levETIRAcetam (KEPPRA) 500 MG tablet Take 500 mg by mouth daily.  10/03/17   [provider]  lisinopril (ZESTRIL) 10 MG tablet Take 1  tablet (10 mg total) by mouth daily. 11/06/19   Lorella Nimrod, MD  metFORMIN (GLUCOPHAGE) 500 MG tablet Take 1 tablet (500 mg total) by mouth daily with breakfast. Patient taking differently: Take 1,000 mg by mouth 2 (two) times daily with a meal.  01/06/19   Johnson, Megan P, DO  ondansetron (ZOFRAN-ODT) 4 MG disintegrating tablet Take 4 mg by mouth every 8 (eight) hours as needed. 01/19/20   [provider]  pantoprazole (PROTONIX) 40 MG tablet Take 40 mg by mouth daily as needed (Heartburn).  05/28/18   [provider]  rifaximin (XIFAXAN) 550 MG TABS tablet Take 1 tablet (550 mg total) by mouth 3 (three) times daily. Patient not taking: Reported on 02/15/2020 01/10/20   Lucilla Lame, MD  rosuvastatin (CRESTOR) 5 MG tablet Take 5 mg by mouth daily. 01/09/20   [provider]  SUMAtriptan (IMITREX) 100 MG tablet Take 1 tablet (100 mg total) by mouth every 2 (two) hours as needed for migraine. May repeat in 2 hours if headache persists or recurs. 10/07/18   Park Liter P, DO  SYNTHROID 100 MCG tablet Take 1 tablet (100 mcg total) by mouth daily before breakfast. 10/07/18   Park Liter P, DO  Allergies Patient has no known allergies.  Family History  Problem Relation Age of Onset  . Lung cancer Maternal Aunt   . Breast cancer Maternal Aunt 57  . Other Mother        Killed in car accident, 20  . Other Father        Killed in car accident, 73  . Thyroid disease Brother   . Thyroid disease Sister     Social History Social History   Tobacco Use  . Smoking status: Former Smoker    Packs/day: 2.00    Years: 45.00    Pack years: 90.00    Types: Cigarettes    Quit date: 11/06/2011    Years since quitting: 8.4  . Smokeless tobacco: Never Used  Vaping Use  . Vaping Use: Never used  Substance Use Topics  . Alcohol use: No  . Drug use: No    Review of Systems  Review of Systems  Constitutional: Positive for chills and malaise/fatigue. Negative for fever.    HENT: Negative for sore throat.   Eyes: Negative for pain.  Respiratory: Negative for cough and stridor.   Cardiovascular: Negative for chest pain ( very mild pressure).  Gastrointestinal: Negative for vomiting.  Skin: Negative for rash.  Neurological: Positive for dizziness. Negative for seizures, loss of consciousness and headaches.       Lightheaded  Psychiatric/Behavioral: Negative for suicidal ideas.  All other systems reviewed and are negative.     ____________________________________________   PHYSICAL EXAM:  VITAL SIGNS: ED Triage Vitals  Enc Vitals Group     BP 04/12/20 1735 (!) 144/105     Pulse Rate 04/12/20 1735 (!) 105     Resp 04/12/20 1735 20     Temp 04/12/20 1735 97.8 F (36.6 C)     Temp Source 04/12/20 1735 Oral     SpO2 04/12/20 1735 100 %     Weight 04/12/20 1735 186 lb (84.4 kg)     Height 04/12/20 1735 5\' 6"  (1.676 m)     Head Circumference --      Peak Flow --      Pain Score 04/12/20 1808 0     Pain Loc --      Pain Edu? --      Excl. in [place]? --    Vitals:   04/13/20 0830 04/13/20 0939  BP: 138/75 (!) 151/89  Pulse: 85   Resp: 14   Temp:    SpO2: 99%    Physical Exam Vitals and nursing note reviewed.  Constitutional:      General: She is not in acute distress.    Appearance: She is well-developed.  HENT:     Head: Normocephalic and atraumatic.     Right Ear: External ear normal.     Left Ear: External ear normal.     Nose: Nose normal.  Eyes:     Conjunctiva/sclera: Conjunctivae normal.  Cardiovascular:     Rate and Rhythm: Normal rate and regular rhythm.     Heart sounds: No murmur heard.   Pulmonary:     Effort: Pulmonary effort is normal. No respiratory distress.     Breath sounds: Normal breath sounds.  Abdominal:     Palpations: Abdomen is soft.     Tenderness: There is no abdominal tenderness.  Musculoskeletal:     Cervical back: Neck supple.  Skin:    General: Skin is warm and dry.     Capillary Refill: Capillary  refill takes less than  2 seconds.  Neurological:     Mental Status: She is alert and oriented to person, place, and time.  Psychiatric:        Mood and Affect: Mood normal.     Cranial nerves II through XII grossly intact.  5/5 strength in all extremities.  Sensation intact to light touch in all extremities.  No pronator drift.  No finger dysmetria. ____________________________________________   LABS (all labs ordered are listed, but only abnormal results are displayed)  Labs Reviewed  GLUCOSE, CAPILLARY - Abnormal; Notable for the following components:      Result Value   Glucose-Capillary 108 (*)    All other components within normal limits  URINALYSIS, COMPLETE (UACMP) WITH MICROSCOPIC - Abnormal; Notable for the following components:   Color, Urine YELLOW (*)    APPearance CLEAR (*)    All other components within normal limits  COMPREHENSIVE METABOLIC PANEL - Abnormal; Notable for the following components:   Glucose, Bld 133 (*)    BUN 29 (*)    Creatinine, Ser 1.24 (*)    GFR calc non Af Amer 44 (*)    GFR calc Af Amer 51 (*)    All other components within normal limits  BASIC METABOLIC PANEL - Abnormal; Notable for the following components:   Glucose, Bld 119 (*)    BUN 27 (*)    Creatinine, Ser 1.04 (*)    GFR calc non Af Amer 54 (*)    All other components within normal limits  SARS CORONAVIRUS 2 BY RT PCR (HOSPITAL ORDER, [place] [facility])  TSH  BRAIN NATRIURETIC PEPTIDE  MAGNESIUM  CBG MONITORING, ED  TROPONIN I (HIGH SENSITIVITY)  TROPONIN I (HIGH SENSITIVITY)   ____________________________________________  EKG Sinus tachycardia with a ventricular height of 102, normal axis, unremarkable intervals, Q waves in the inferior leads which are new when compared to prior no other evidence of acute ischemia or other significant arrhythmia.  ____________________________________________  RADIOLOGY   Official radiology report(s): CT Angio  Chest PE W and/or Wo Contrast  Result Date: 04/13/2020 CLINICAL DATA:  Near syncope, dizziness, diaphoresis. Breast cancer. EXAM: CT ANGIOGRAPHY CHEST WITH CONTRAST TECHNIQUE: Multidetector CT imaging of the chest was performed using the standard protocol during bolus administration of intravenous contrast. Multiplanar CT image reconstructions and MIPs were obtained to evaluate the vascular anatomy. CONTRAST:  26mL OMNIPAQUE IOHEXOL 350 MG/ML SOLN COMPARISON:  CT chest dated 05/30/2019. FINDINGS: Cardiovascular: Satisfactory opacification of the bilateral pulmonary arteries to the segmental level. No evidence of pulmonary embolism. Although not tailored for evaluation of the thoracic aorta, there is no evidence of thoracic aortic aneurysm or dissection. Atherosclerotic calcifications of the aortic arch. The heart is top-normal in size.  No pericardial effusion. Three vessel coronary atherosclerosis. Mediastinum/Nodes: No suspicious mediastinal, hilar, or axillary lymphadenopathy. Visualized thyroid is unremarkable. Lungs/Pleura: Mild subpleural reticulation/fibrosis in the lungs bilaterally, suggesting mild chronic interstitial lung disease. Mild centrilobular and paraseptal emphysematous changes, upper lung predominant. Stable 7 mm perifissural nodule along the minor fissure (series 6/image 41). Stable 5 mm subpleural nodule in the left lower lobe (series 6/image 63). 7 mm subpleural nodular opacity in the posterior right lower lobe (series 6/image 53), slightly more conspicuous than on the prior, but likely grossly unchanged. No suspicious pulmonary nodules. No focal consolidation. No pleural effusion or pneumothorax. Upper Abdomen: Visualized upper abdomen is notable for prior cholecystectomy, a 2.3 cm left adrenal adenoma, and vascular calcifications. Musculoskeletal: Postprocedural changes in the left breast (series 4/images  48 through 51). Mild degenerative changes of the visualized thoracolumbar spine.  Review of the MIP images confirms the above findings. IMPRESSION: No evidence of pulmonary embolism. Postprocedural changes in the left breast. No findings suspicious for metastatic disease. Stable bilateral pulmonary nodules measuring up to 7 mm, likely benign. Aortic Atherosclerosis (ICD10-I70.0) and Emphysema (ICD10-J43.9). Electronically Signed   By: Julian Hy M.D.   On: 04/13/2020 08:47    ____________________________________________   PROCEDURES  Procedure(s) performed (including Critical Care):  .1-3 Lead EKG Interpretation Performed by: Lucrezia Starch, MD Authorized by: Lucrezia Starch, MD     Interpretation: normal     ECG rate assessment: normal     Rhythm: sinus rhythm     Ectopy: none       ____________________________________________   INITIAL IMPRESSION / ASSESSMENT AND PLAN / ED COURSE        Overall unclear etiology for patient's fatigue and presyncopal symptoms although patient is at risk for cardiac etiology given multiple comorbidities and new ischemic changes on ECG.  While 2 troponins obtained over 2 hours are not elevated patient does warrant telemetry and likely TTE to assess for evidence of new motion abnormalities.  No evidence of PE on CTA chest, pneumonia, pulmonary edema, or other acute thoracic process aside from known cancer.  No significant electrolyte or metabolic derangements identified above labs UA is not suggestive of urinary tract infection.  No focal deficit on exam to suggest CVA and history is not suggestive of toxic ingestion or other immediate life-threatening pathology.  We will plan to admit to medicine service for further evaluation management high risk for presyncopal symptoms.          ____________________________________________   FINAL CLINICAL IMPRESSION(S) / ED DIAGNOSES  Final diagnoses:  Near syncope  Q waves suggestive of previous myocardial infarction  Hypertension, unspecified type  Dyslipidemia (high  LDL; low HDL)     ED Discharge Orders    None       Note:  This document was prepared using Dragon voice recognition software and may include unintentional dictation errors.   Lucrezia Starch, MD 04/13/20 1012    Lucrezia Starch, MD 04/13/20 1013

## 2020-04-13 NOTE — Progress Notes (Signed)
*  PRELIMINARY RESULTS* Echocardiogram 2D Echocardiogram has been performed.  Leslie Olsen 04/13/2020, 2:20 PM

## 2020-04-13 NOTE — H&P (Signed)
History and Physical    Leslie Olsen SVX:[id number] DOB: [birth year] DOA: 04/13/2020  PCP: [place] Consult[facility]:  Hoyt Koch - cardiology; Mickie Bail - neurology; Tasia Catchings - oncology; Pearlstein - rad onc; Wohl - GI Patient coming from:  Home - lives alone; NOK: Jed Limerick, [phone]  Chief Complaint: near syncope  HPI: Leslie Olsen is a 69 y.o. female with medical history significant of OSA; hypothyroidism; HTN; HLD; DM; COPD; CAD s/p stent; and breast cancer presenting with near syncope.  She reports that she had CAD with stent in November (also one in 2008).  Developed GI bleed in feb from Effient.  Diagnosed in March with breast cancer.  No surgery due to persistent need for Effient (needs for 1 year), so sent to [facility] and so surgery was done May 14.  She is still chronically fatigued.  She went to the cancer center yesterday and her Hgb dropped again (to 11), ferritin also lower (7 points lower but still WNL); she is due to see Dr. Tasia Catchings Monday.  She had a multiple myeloma panel in June - lambda light chain was high but the panel was otherwise normal.  She reports that yesterday afternoon she got hot, sweaty, shaky and thought maybe she was hypoglycemic.  Glucose 80s, so she ate quickly and glucose barely came up.  She continued to feel weak and swimmy headed.  She called her DIL and she brought her to the hospital.  Her BP was high, glucose low, and she felt "out of myself like I didn't even belong in my body."  She felt like she was going to pass out.  The sweating and shaking feeling has resolved but she continues to feel weak (although this is chronic and unchanged).  She feels very tired.  No fever.  Also had a bout of SIBO - small intestinal bacterial overgrowth - unable to afford treatment ($1800 for 40 pills), and so took 10 days of treatment via samples in June(?) and this appears to have resolved.    ED Course:  Very high cholesterol, here with near syncope.  ?TTE.  EKG with new  ischemic changes, new Q waves.  Normal troponins.  Review of Systems: As per HPI; otherwise review of systems reviewed and negative.   Ambulatory Status:  Ambulates without assistance  COVID Vaccine Status:   Complete  Past Medical History:  Diagnosis Date  . Anemia   . CAD (coronary artery disease)    stent in 2017, Nov 2020  . Cancer ([place]) 11/2019   Breast cancer   . Cephalalgia 04/08/2015  . Chronic headaches   . COPD (chronic obstructive pulmonary disease) ([place])   . Diabetes mellitus without complication ([place])   . Fatigue due to excessive exertion 08/22/2018  . GERD (gastroesophageal reflux disease)   . Hyperlipidemia   . Hypertension   . Hypothyroidism   . [facility] spotted fever) 07/27/2016  . Sleep apnea     Past Surgical History:  Procedure Laterality Date  . ABDOMINAL HYSTERECTOMY    . BACK SURGERY    . BREAST BIOPSY Left 11/15/2019   [place] bx , heart marker   , path pending  . BREAST EXCISIONAL BIOPSY Left 1970's  . CHOLECYSTECTOMY    . COLONOSCOPY N/A 11/05/2019   Procedure: COLONOSCOPY;  Surgeon: Toledo, Benay Pike, MD;  Location: [facility] ENDOSCOPY;  Service: Gastroenterology;  Laterality: N/A;  . CORONARY STENT INTERVENTION N/A 07/31/2019   Procedure: CORONARY STENT INTERVENTION;  Surgeon: Isaias Cowman, MD;  Location: [place] CV LAB;  Service: Cardiovascular;  Laterality: N/A;  RCA  . CORONARY STENT PLACEMENT    . ESOPHAGOGASTRODUODENOSCOPY N/A 11/05/2019   Procedure: ESOPHAGOGASTRODUODENOSCOPY (EGD);  Surgeon: Toledo, Benay Pike, MD;  Location: [facility] ENDOSCOPY;  Service: Gastroenterology;  Laterality: N/A;  . ESOPHAGOGASTRODUODENOSCOPY (EGD) WITH PROPOFOL N/A 05/26/2016   Procedure: ESOPHAGOGASTRODUODENOSCOPY (EGD) WITH PROPOFOL;  Surgeon: Lucilla Lame, MD;  Location: [facility] ENDOSCOPY;  Service: Endoscopy;  Laterality: N/A;  . GIVENS CAPSULE STUDY N/A 05/05/2016   Procedure: GIVENS CAPSULE STUDY;  Surgeon: Lucilla Lame, MD;  Location: [facility] ENDOSCOPY;   Service: Endoscopy;  Laterality: N/A;  . JOINT REPLACEMENT    . LEFT HEART CATH AND CORONARY ANGIOGRAPHY Left 07/31/2019   Procedure: LEFT HEART CATH AND CORONARY ANGIOGRAPHY;  Surgeon: Isaias Cowman, MD;  Location: [place] CV LAB;  Service: Cardiovascular;  Laterality: Left;  . REDUCTION MAMMAPLASTY    . REPLACEMENT TOTAL KNEE    . SHOULDER SURGERY    . TIBIA FRACTURE SURGERY      Social History   Socioeconomic History  . Marital status: Divorced    Spouse name: Not on file  . Number of children: 1  . Years of education: 39  . Highest education level: 12th grade  Occupational History  . Occupation: retired   Tobacco Use  . Smoking status: Former Smoker    Packs/day: 2.00    Years: 45.00    Pack years: 90.00    Types: Cigarettes    Quit date: 11/06/2011    Years since quitting: 8.4  . Smokeless tobacco: Never Used  Vaping Use  . Vaping Use: Never used  Substance and Sexual Activity  . Alcohol use: No  . Drug use: No  . Sexual activity: Never  Other Topics Concern  . Not on file  Social History Narrative   Lives alone.  She has one grown son.   She works as a Retail buyer at [employer]   Highest level of education was high school   Social Determinants of Health   Financial Resource Strain:   . Difficulty of Paying Living Expenses:   Food Insecurity:   . Worried About Charity fundraiser in the Last Year:   . Arboriculturist in the Last Year:   Transportation Needs:   . Film/video editor (Medical):   Marland Kitchen Lack of Transportation (Non-Medical):   Physical Activity:   . Days of Exercise per Week:   . Minutes of Exercise per Session:   Stress:   . Feeling of Stress :   Social Connections:   . Frequency of Communication with Friends and Family:   . Frequency of Social Gatherings with Friends and Family:   . Attends Religious Services:   . Active Member of Clubs or Organizations:   . Attends Archivist Meetings:   Marland Kitchen Marital Status:     Intimate Partner Violence:   . Fear of Current or Ex-Partner:   . Emotionally Abused:   Marland Kitchen Physically Abused:   . Sexually Abused:     No Known Allergies  Family History  Problem Relation Age of Onset  . Lung cancer Maternal Aunt   . Breast cancer Maternal Aunt 57  . Other Mother        Killed in car accident, 33  . Other Father        Killed in car accident, 30  . Thyroid disease Brother   . Thyroid disease Sister     Prior to Admission  medications   Medication Sig Start Date End Date Taking? Authorizing Provider  aspirin 81 MG tablet Take 81 mg by mouth daily.    [provider]  Cholecalciferol (VITAMIN D) 50 MCG (2000 UT) CAPS Take 2,000 Units by mouth daily.     [provider]  clonazePAM (KLONOPIN) 0.5 MG tablet Take 0.5 mg by mouth 2 (two) times daily.  02/23/16   [provider]  docusate sodium (COLACE) 100 MG capsule Take 100 mg by mouth 2 (two) times daily. 01/19/20   [provider]  ferrous gluconate (FERGON) 324 MG tablet Take 324 mg by mouth daily. 05/28/18   [provider]  glucose blood (ONE TOUCH ULTRA TEST) test strip USE ONE STRIP TO CHECK GLUCOSE TWICE DAILY 10/07/18   Johnson, Megan P, DO  levETIRAcetam (KEPPRA) 500 MG tablet Take 500 mg by mouth daily.  10/03/17   [provider]  lisinopril (ZESTRIL) 10 MG tablet Take 1 tablet (10 mg total) by mouth daily. 11/06/19   Lorella Nimrod, MD  metFORMIN (GLUCOPHAGE) 500 MG tablet Take 1 tablet (500 mg total) by mouth daily with breakfast. Patient taking differently: Take 1,000 mg by mouth 2 (two) times daily with a meal.  01/06/19   Johnson, Megan P, DO  ondansetron (ZOFRAN-ODT) 4 MG disintegrating tablet Take 4 mg by mouth every 8 (eight) hours as needed. 01/19/20   [provider]  pantoprazole (PROTONIX) 40 MG tablet Take 40 mg by mouth daily as needed (Heartburn).  05/28/18   [provider]  rifaximin (XIFAXAN) 550 MG TABS tablet Take 1 tablet (550  mg total) by mouth 3 (three) times daily. Patient not taking: Reported on 02/15/2020 01/10/20   Lucilla Lame, MD  rosuvastatin (CRESTOR) 5 MG tablet Take 5 mg by mouth daily. 01/09/20   [provider]  SUMAtriptan (IMITREX) 100 MG tablet Take 1 tablet (100 mg total) by mouth every 2 (two) hours as needed for migraine. May repeat in 2 hours if headache persists or recurs. 10/07/18   Valerie Roys, DO  SYNTHROID 100 MCG tablet Take 1 tablet (100 mcg total) by mouth daily before breakfast. 10/07/18   Valerie Roys, DO    Physical Exam: Vitals:   04/13/20 0422 04/13/20 0830 04/13/20 0939 04/13/20 1000  BP: 127/66 138/75 (!) 151/89 137/82  Pulse: 88 85 93 88  Resp: 18 14 (!) 27 16  Temp:      TempSrc:      SpO2: 100% 99% 99% 98%  Weight:      Height:         . General:  Appears calm and comfortable and is NAD. Appears fatigued . Eyes:  PERRL, EOMI, normal lids, iris . ENT:  grossly normal hearing, lips & tongue, mmm; appropriate dentition . Neck:  no LAD, masses or thyromegaly; no carotid bruits . Cardiovascular:  RRR, no m/r/g. Trace to 1+ LE edema.  Marland Kitchen Respiratory:   CTA bilaterally with no wheezes/rales/rhonchi.  Normal respiratory effort. . Abdomen:  soft, NT, ND, NABS . Skin:  no rash or induration seen on limited exam . Musculoskeletal:  grossly normal tone BUE/BLE, good ROM, no bony abnormality . Psychiatric:  grossly normal mood and affect, speech fluent and appropriate, AOx3 . Neurologic:  CN 2-12 grossly intact, moves all extremities in coordinated fashion    Radiological Exams on Admission: CT Angio Chest PE W and/or Wo Contrast  Result Date: 04/13/2020 CLINICAL DATA:  Near syncope, dizziness, diaphoresis. Breast cancer. EXAM: CT  ANGIOGRAPHY CHEST WITH CONTRAST TECHNIQUE: Multidetector CT imaging of the chest was performed using the standard protocol during bolus administration of intravenous contrast. Multiplanar CT image reconstructions and MIPs were obtained to  evaluate the vascular anatomy. CONTRAST:  38m OMNIPAQUE IOHEXOL 350 MG/ML SOLN COMPARISON:  CT chest dated 05/30/2019. FINDINGS: Cardiovascular: Satisfactory opacification of the bilateral pulmonary arteries to the segmental level. No evidence of pulmonary embolism. Although not tailored for evaluation of the thoracic aorta, there is no evidence of thoracic aortic aneurysm or dissection. Atherosclerotic calcifications of the aortic arch. The heart is top-normal in size.  No pericardial effusion. Three vessel coronary atherosclerosis. Mediastinum/Nodes: No suspicious mediastinal, hilar, or axillary lymphadenopathy. Visualized thyroid is unremarkable. Lungs/Pleura: Mild subpleural reticulation/fibrosis in the lungs bilaterally, suggesting mild chronic interstitial lung disease. Mild centrilobular and paraseptal emphysematous changes, upper lung predominant. Stable 7 mm perifissural nodule along the minor fissure (series 6/image 41). Stable 5 mm subpleural nodule in the left lower lobe (series 6/image 63). 7 mm subpleural nodular opacity in the posterior right lower lobe (series 6/image 53), slightly more conspicuous than on the prior, but likely grossly unchanged. No suspicious pulmonary nodules. No focal consolidation. No pleural effusion or pneumothorax. Upper Abdomen: Visualized upper abdomen is notable for prior cholecystectomy, a 2.3 cm left adrenal adenoma, and vascular calcifications. Musculoskeletal: Postprocedural changes in the left breast (series 4/images 48 through 51). Mild degenerative changes of the visualized thoracolumbar spine. Review of the MIP images confirms the above findings. IMPRESSION: No evidence of pulmonary embolism. Postprocedural changes in the left breast. No findings suspicious for metastatic disease. Stable bilateral pulmonary nodules measuring up to 7 mm, likely benign. Aortic Atherosclerosis (ICD10-I70.0) and Emphysema (ICD10-J43.9). Electronically Signed   By: SJulian Hy M.D.   On: 04/13/2020 08:47    EKG: Independently reviewed.  Sinus tachycardia with rate 102; nonspecific ST changes with ?new Q wave from prior   Labs on Admission: I have personally reviewed the available labs and imaging studies at the time of the admission.  Pertinent labs:   Glucose 119 BUN 27/Creatinine 1.04/GFR 54 - improved from 8/6 8/6 CBC: WBC 4.8 Hgb 11.0; 10.8 on 6/8 HS troponin 5; 3 on 8/6 TSH 2.168 UA: WNL Normal anemia panel on 8/6 Lipids on 8/3: 296/53/184/297   Assessment/Plan Principal Problem:   Near syncope Active Problems:   COPD (chronic obstructive pulmonary disease) (HCC)   OSA on CPAP   Hyperlipidemia   Essential hypertension, benign   Obesity   Atherosclerosis of native coronary artery of native heart without angina pectoris   Type II diabetes mellitus with renal manifestations (HCC)   Hypothyroidism   [place] of breast [place]   Near syncope -Patient with chronic fatigue, likely related to multitude of chronic medical conditions including CAD, breast cancer -Yesterday, she had an episode of near syncope -Possibly related to perceived hypoglycemia (glucose 80 but if her glucose is generally uncontrolled then 80 could cause her to feel symptomatic) -EKG with ?new findings so Echo is reasonable -By the [facility]yncope rule, the patient is at moderate/high risk for serious outcome and thus should be observed in the hospital. -Will monitor on telemetry -Orthostatic vital signs in AM -Troponin negative x 2 -Neuro checks  -Awaiting med rec but she appears to be on Keppra - why is she taking this?  No current concern for seizure or known h/o. -Also prior h/o Rifaximin but not currently taking - ?cause.  CAD -s/p stent x 2 to RCA, last in 07/2019 -Continue  ASA indefinitely -She was previously encouraged to be on Effient for 1 year but this was stopped after 6 months given her h/o symptomatic anemia  Anxiety -Continue  Klonopin  Anemia -Appears to be stable since her GI bleed in February -s/p iron infusions x 3 -Given normal anemia panel currently, she does not appear to need additional iron infusion at this time -Continue Wynonia Lawman -She has outpatient oncology f/u scheduled already for Monday  DM -Will check A1c -hold Glucophage -Cover with moderate-scale SSI  Breast cancer -Stage 1 breast cancer diagnosed in March, s/p partial mastectomy -She has been recommended to do 16 treatments of whole breast radiation therapy; it appears that these were completed in July  Hypothyroidism -Normal TSH -Continue Synthroid at current dose for now  HTN -Continue Lisinopril, increased dose to 20 mg daily as of 8/3  Stage 3a CKD -Appears to be stable at this time -Recheck BMP in AM  HLD -Very poor control  -Unable to tolerate Crestor according to last cardiology note -Considering transition to PCSK9 inhibitor  COPD -No evidence of decompensation and she does not appear to be taking even prn medications for this issue currently  OSA -Continue CPAP  Obesity Body mass index is 30.02 kg/m.  -Weight loss should be encouraged -Outpatient PCP/bariatric medicine f/u encouraged    Note: This patient has been tested and is negative for the novel coronavirus COVID-19.  DVT prophylaxis:  Lovenox Code Status:  Partial - confirmed with patient Family Communication: None present Disposition Plan:  The patient is from: home  Anticipated d/c is to: home without [facility] services  Anticipated d/c date will depend on clinical response to treatment, but possibly as early as tomorrow if she has excellent response to treatment  Patient is currently: acutely ill Consults called: PT/OT  Admission status:  It is my clinical opinion that referral for OBSERVATION is reasonable and necessary in this patient based on the above information provided. The aforementioned taken together are felt to place the patient at high risk  for further clinical deterioration. However it is anticipated that the patient may be medically stable for discharge from the hospital within 24 to 48 hours.    Karmen Bongo MD [facility]   How to contact the [facility] Attending or Consulting provider [place] or covering provider during after hours [place], for this patient?  1. Check the care team in [facility] and look for a) attending/consulting TR[facility] provider listed and b) the [facility] team listed 2. Log into [email or web address] and use 's universal password to access. If you do not have the password, please contact the hospital operator. 3. Locate the [facility] provider you are looking for under [facility] and page to a number that you can be directly reached. 4. If you still have difficulty reaching the provider, please page the [facility] (Director on Call) for the Hospitalists listed on amion for assistance.   04/13/2020, 11:04 AM

## 2020-04-13 NOTE — ED Notes (Signed)
Pt given toothbrush toothpaste and soap as well as washcloths and towels and a new gown.  Pt washing up at the sink at this time

## 2020-04-13 NOTE — Progress Notes (Signed)
Physical Therapy Evaluation Patient Details Name: Leslie Olsen MRN: [id number] DOB: 24-Jun-1951 Today's Date: 04/13/2020   History of Present Illness  69 y.o. female with medical history significant of OSA; hypothyroidism; HTN; HLD; DM; COPD; CAD s/p stent; and breast cancer presenting with near syncope.  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  She took her BP and it was high and her BS was 86 at home which she says is low for her.  Clinical Impression  Pt was assessed and note her difficulty with controlling balance on RLE, which may be pre-existing.  Recommending a SPC and HHPT to remediate the longer term changes on RLE worsened by current condition.  Follow acutely for the same needs.    Follow Up Recommendations Home health PT;Supervision for mobility/OOB    Equipment Recommendations  Cane    Recommendations for Other Services       Precautions / Restrictions Precautions Precautions: Fall Precaution Comments: monitor vitals Restrictions Weight Bearing Restrictions: No      Mobility  Bed Mobility Overal bed mobility: Modified Independent             General bed mobility comments: supervised for safety  Transfers Overall transfer level: Modified independent Equipment used: 1 person hand held assist             General transfer comment: mod I for standing from gurney and commode in ED room  Ambulation/Gait Ambulation/Gait assistance: Min guard Gait Distance (Feet): 35 Feet Assistive device: 1 person hand held assist Gait Pattern/deviations: Step-through pattern;Wide base of support Gait velocity: reduced Gait velocity interpretation: <1.31 ft/sec, indicative of household ambulator General Gait Details: pt has mild unsteadiness on RLE but has history of tib fracture and compartment syndrome, R TKA  Stairs            Wheelchair Mobility    Modified Rankin  (Stroke Patients Only)       Balance Overall balance assessment: Needs assistance Sitting-balance support: Feet supported Sitting balance-Leahy Scale: Good     Standing balance support: Single extremity supported Standing balance-Leahy Scale: Fair Standing balance comment: less than fair at times on dynamic tasks                             Pertinent Vitals/Pain Pain Assessment: No/denies pain    Home Living Family/patient expects to be discharged to:: Private residence Living Arrangements: Alone Available Help at Discharge: Family Type of Home: Mobile home Home Access: Stairs to enter Entrance Stairs-Rails: Chemical engineer of Steps: 4 Home Layout: One level Home Equipment: Grab bars - tub/shower Additional Comments: per pt has daughter who can stay home with her    Prior Function Level of Independence: Independent               Hand Dominance   Dominant Hand: Right    Extremity/Trunk Assessment   Upper Extremity Assessment Upper Extremity Assessment: Overall WFL for tasks assessed    Lower Extremity Assessment Lower Extremity Assessment: Generalized weakness    Cervical / Trunk Assessment Cervical / Trunk Assessment: Kyphotic  Communication   Communication: No difficulties  Cognition Arousal/Alertness: Awake/alert Behavior During Therapy: WFL for tasks assessed/performed Overall Cognitive Status: Within Functional Limits for tasks assessed  General Comments General comments (skin integrity, edema, etc.): pt is generally weak but specifically having issues from LT ortho changes to RLE.      Exercises     Assessment/Plan    PT Assessment Patient needs continued PT services  PT Problem List Decreased strength;Decreased range of motion;Decreased activity tolerance;Decreased mobility;Decreased balance;Decreased coordination;Decreased knowledge of use of  DME;Cardiopulmonary status limiting activity       PT Treatment Interventions DME instruction;Gait training;Stair training;Functional mobility training;Therapeutic activities;Therapeutic exercise;Balance training;Neuromuscular re-education;Patient/family education    PT Goals (Current goals can be found in the Care Plan section)  Acute Rehab PT Goals Patient Stated Goal: to get home and feel stronger PT Goal Formulation: With patient Time For Goal Achievement: 04/20/20 Potential to Achieve Goals: Good    Frequency Min 2X/week   Barriers to discharge Inaccessible home environment;Decreased caregiver support home alone at times with steps to enter trailer    Co-evaluation               AM-PAC PT "6 Clicks" Mobility  Outcome Measure Help needed turning from your back to your side while in a flat bed without using bedrails?: A Little Help needed moving from lying on your back to sitting on the side of a flat bed without using bedrails?: A Little Help needed moving to and from a bed to a chair (including a wheelchair)?: A Little Help needed standing up from a chair using your arms (e.g., wheelchair or bedside chair)?: A Little Help needed to walk in hospital room?: A Little Help needed climbing 3-5 steps with a railing? : A Lot 6 Click Score: 17    End of Session   Activity Tolerance: Patient limited by fatigue;Treatment limited secondary to medical complications (Comment) Patient left: in bed;with call bell/phone within reach Nurse Communication: Mobility status;Other (comment) (discharge recommendations) PT Visit Diagnosis: Unsteadiness on feet (R26.81);Muscle weakness (generalized) (M62.81)    Time: 1610-9604 PT Time Calculation (min) (ACUTE ONLY): 37 min   Charges:   PT Evaluation $PT Eval Moderate Complexity: 1 Mod PT Treatments $Gait Training: 8-22 mins       Ramond Dial 04/13/2020, 3:22 PM  Leslie Olsen, PT MS Acute Rehab Dept. Number: [place] and [place]

## 2020-04-13 NOTE — Evaluation (Signed)
Occupational Therapy Evaluation Patient Details Name: Leslie Olsen MRN: [id number] DOB: 1950/10/23 Today's Date: 04/13/2020    History of Present Illness Leslie Olsen is a 69 y.o. female with medical history significant of OSA; hypothyroidism; HTN; HLD; DM; COPD; CAD s/p stent; and breast cancer presenting with near syncope.  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  She took her BP and it was high and her BS was 86 at home which she says is low for her.   Clinical Impression   Patient was seen for OT evaluation this date, patient was able to demonstrate transfers, functional mobility around the room, lower body dressing with independence.  She denied any dizziness during session.  She reports she has chronic anemia and suffers from extreme fatigue all the time.  Patient was issued and instructed on energy conservation techniques to assist during daily tasks.  Patient is a fall risk based on her syncopal episode. She has family nearby who assists as needed.  Prior to admission she was independent with ADL and IADL tasks and ambulated without any assistive devices.  She was still able to drive.  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 She appears at her baseline and does not require any additional OT follow up at this time.  Please reconsult if further needs arise.      Follow Up Recommendations  No OT follow up    Equipment Recommendations       Recommendations for Other Services       Precautions / Restrictions Precautions Precautions: Fall      Mobility Bed Mobility Overal bed mobility: Independent                Transfers Overall transfer level: Independent               General transfer comment: Patient able to transfer from gurney from supine to sit  independently, able to stand independently and walk short distances, sat on bench to perform LB dressing tasks.  No dizziness noted during session.    Balance                                           ADL either performed or assessed with clinical judgement   ADL Overall ADL's : Modified independent                                       General ADL Comments: Patient able to demonstrate lower body dressing from seated position. She reports fatigue is constant from chronic anemia and has blood infusions often, reports rest does not help.  Issued and instructed on energy conservation techniques to help with management of symptoms of fatigue.     Vision Patient Visual Report: No change from baseline       Perception     Praxis      Pertinent Vitals/Pain Pain Assessment: No/denies pain     Hand Dominance Right   Extremity/Trunk Assessment Upper Extremity Assessment Upper Extremity Assessment: Generalized weakness   Lower Extremity Assessment Lower Extremity Assessment: Defer to  PT evaluation       Communication Communication Communication: No difficulties   Cognition Arousal/Alertness: Awake/alert Behavior During Therapy: WFL for tasks assessed/performed Overall Cognitive Status: Within Functional Limits for tasks assessed                                     General Comments  Sitting balance good, standing balance good for tasks in session, PT to evaluate further. No dizziness reported during session.  Patient reports she has had episodes in the past of dizziness but unlike the one yesterday with shaking and sweaty.    Exercises     Shoulder Instructions      Home Living Family/patient expects to be discharged to:: Private residence Living Arrangements: Alone Available Help at Discharge: Family Type of Home: Mobile home Home Access: Stairs to enter Technical brewer of Steps: 5 Entrance Stairs-Rails:  Left;Right Home Layout: One level     Bathroom Shower/Tub: Corporate investment banker: Handicapped height     Home Equipment: Grab bars - tub/shower          Prior Functioning/Environment Level of Independence: Independent                 OT Problem List: Other (comment) (syncopal episode/fall risk)      OT Treatment/Interventions:      OT Goals(Current goals can be found in the care plan section) Acute Rehab OT Goals Patient Stated Goal: Patient would like to return home and care for herself OT Goal Formulation: With patient Time For Goal Achievement: 04/20/20 Potential to Achieve Goals: Good  OT Frequency:     Barriers to D/C:            Co-evaluation              AM-PAC OT "6 Clicks" Daily Activity     Outcome Measure Help from another person eating meals?: None Help from another person taking care of personal grooming?: None Help from another person toileting, which includes using toliet, bedpan, or urinal?: None Help from another person bathing (including washing, rinsing, drying)?: None Help from another person to put on and taking off regular upper body clothing?: None Help from another person to put on and taking off regular lower body clothing?: None 6 Click Score: 24   End of Session Equipment Utilized During Treatment: Gait belt  Activity Tolerance: Patient tolerated treatment well Patient left: in bed;with call bell/phone within reach  OT Visit Diagnosis: Unsteadiness on feet (R26.81);Muscle weakness (generalized) (M62.81)                Time: 1230-1306 OT Time Calculation (min): 36 min Charges:  OT General Charges $OT Visit: 1 Visit OT Evaluation $OT Eval Low Complexity: 1 Low  Mikaele Stecher T Swetha Rayle, OTR/L, CLT   Donnie Panik 04/13/2020, 1:39 PM

## 2020-04-13 NOTE — ED Notes (Signed)
OT recommends ambulation with cane and home therapy- OT to follow during admission.

## 2020-04-14 DIAGNOSIS — D509 Iron deficiency anemia, unspecified: Secondary | ICD-10-CM | POA: Diagnosis not present

## 2020-04-14 DIAGNOSIS — E1122 Type 2 diabetes mellitus with diabetic chronic kidney disease: Secondary | ICD-10-CM | POA: Diagnosis not present

## 2020-04-14 DIAGNOSIS — N183 Chronic kidney disease, stage 3 unspecified: Secondary | ICD-10-CM

## 2020-04-14 DIAGNOSIS — R55 Syncope and collapse: Secondary | ICD-10-CM | POA: Diagnosis not present

## 2020-04-14 DIAGNOSIS — I10 Essential (primary) hypertension: Secondary | ICD-10-CM

## 2020-04-14 DIAGNOSIS — C50919 Malignant neoplasm of unspecified site of unspecified female breast: Secondary | ICD-10-CM

## 2020-04-14 LAB — BASIC METABOLIC PANEL
Anion gap: 7 (ref 5–15)
BUN: 24 mg/dL — ABNORMAL HIGH (ref 8–23)
CO2: 24 mmol/L (ref 22–32)
Calcium: 9.1 mg/dL (ref 8.9–10.3)
Chloride: 106 mmol/L (ref 98–111)
Creatinine, Ser: 1.1 mg/dL — ABNORMAL HIGH (ref 0.44–1.00)
GFR calc Af Amer: 58 mL/min — ABNORMAL LOW (ref >60)
GFR calc non Af Amer: 50 mL/min — ABNORMAL LOW (ref >60)
Glucose, Bld: 118 mg/dL — ABNORMAL HIGH (ref 70–99)
Potassium: 4.6 mmol/L (ref 3.5–5.1)
Sodium: 137 mmol/L (ref 135–145)

## 2020-04-14 LAB — ECHOCARDIOGRAM COMPLETE
AR max vel: 2.63 cm2
AV Peak grad: 4.4 mmHg
Ao pk vel: 1.05 m/s
Area-P 1/2: 5.42 cm2
S' Lateral: 3.24 cm

## 2020-04-14 LAB — CBC
HCT: 29.2 % — ABNORMAL LOW (ref 36.0–46.0)
Hemoglobin: 9.7 g/dL — ABNORMAL LOW (ref 12.0–15.0)
MCH: 30.2 pg (ref 26.0–34.0)
MCHC: 33.2 g/dL (ref 30.0–36.0)
MCV: 91 fL (ref 80.0–100.0)
Platelets: 227 10*3/uL (ref 150–400)
RBC: 3.21 MIL/uL — ABNORMAL LOW (ref 3.87–5.11)
RDW: 15.3 % (ref 11.5–15.5)
WBC: 4 10*3/uL (ref 4.0–10.5)
nRBC: 0 % (ref 0.0–0.2)

## 2020-04-14 LAB — HEMOGLOBIN A1C
Hgb A1c MFr Bld: 6.3 % — ABNORMAL HIGH (ref 4.8–5.6)
Mean Plasma Glucose: 134.11 mg/dL

## 2020-04-14 LAB — GLUCOSE, CAPILLARY: Glucose-Capillary: 118 mg/dL — ABNORMAL HIGH (ref 70–99)

## 2020-04-14 MED ORDER — FERROUS GLUCONATE 324 (38 FE) MG PO TABS: 324.0000 mg | ORAL_TABLET | Freq: Every day | ORAL | 11 refills | Status: AC

## 2020-04-14 MED ORDER — LISINOPRIL 10 MG PO TABS: 10.0000 mg | ORAL_TABLET | Freq: Every day | ORAL | Status: DC

## 2020-04-14 MED ORDER — LISINOPRIL 10 MG PO TABS: 10.0000 mg | ORAL_TABLET | Freq: Every day | ORAL | 0 refills | Status: AC

## 2020-04-14 MED ORDER — METFORMIN HCL 1000 MG PO TABS: 500.0000 mg | ORAL_TABLET | Freq: Two times a day (BID) | ORAL | 0 refills | Status: AC

## 2020-04-14 NOTE — Social Work (Signed)
TOC CM/SW made contact w/pt over the telephone.  Patient stated that she'll call SW back.   Social worker inquired about home health service per PT recommendations.

## 2020-04-14 NOTE — CV Procedure (Signed)
Preliminary echo results 04/13/2020 Indication is syncope  Impression Normal overall left ventricular function Normal overall wall motion Ejection fraction of at least 55% Evidence of diastolic dysfunction No significant valvular disease No evidence of source of thrombus Final report will be placed in syngo

## 2020-04-14 NOTE — Discharge Summary (Signed)
[facility] [place] NAME: Leslie Olsen    MR#:  [id number]  DATE OF BIRTH:  09/27/1950  DATE OF ADMISSION:  04/13/2020 ADMITTING PHYSICIAN: Leslie Bongo, MD  DATE OF DISCHARGE: 04/14/2020  1:22 PM  PRIMARY CARE PHYSICIAN: Dr. Harrel Olsen   ADMISSION DIAGNOSIS:  Near syncope [R55]  DISCHARGE DIAGNOSIS:  Principal Problem:   Near syncope Active Problems:   COPD (chronic obstructive pulmonary disease) (HCC)   OSA on CPAP   Hyperlipidemia   Essential hypertension, benign   Obesity   Atherosclerosis of native coronary artery of native heart without angina pectoris   Type II diabetes mellitus with renal manifestations (HCC)   Hypothyroidism   Invasive carcinoma of breast ([place])   SECONDARY DIAGNOSIS:   Past Medical History:  Diagnosis Date  . Anemia   . CAD (coronary artery disease)    stent in 2017, Nov 2020  . Cancer ([place]) 11/2019   Breast cancer   . Cephalalgia 04/08/2015  . Chronic headaches   . COPD (chronic obstructive pulmonary disease) ([place])   . Diabetes mellitus without complication ([place])   . Fatigue due to excessive exertion 08/22/2018  . GERD (gastroesophageal reflux disease)   . Hyperlipidemia   . Hypertension   . Hypothyroidism   . [facility] spotted fever) 07/27/2016  . Sleep apnea     HOSPITAL COURSE:   1.  Near syncope.  Patient feeling better after IV fluid hydration.  Physical therapy recommended home with home health.  Patient not orthostatic. 2.  Breast cancer.  Patient status post surgery and radiation.  Has a follow-up with Dr. Tasia Olsen tomorrow. 3.  Type 2 diabetes mellitus with chronic kidney disease stage IIIa.  Hemoglobin A1c little on the lower side at 6.3.  Can cut back Metformin to 500 mg twice daily. 4.  Iron deficiency anemia.  Patient received IV fluids during the hospital course.  Can consider iron infusions with Dr. Tasia Olsen. 5.  Essential hypertension.  Admitting physician increased the  lisinopril to 20 mg daily but blood pressure little low today we will cut back to 10 mg daily.    DISCHARGE CONDITIONS:  Satisfactory  CONSULTS OBTAINED:  none  DRUG ALLERGIES:  No Known Allergies  DISCHARGE MEDICATIONS:   Allergies as of 04/14/2020   No Known Allergies     Medication List    STOP taking these medications   rifaximin 550 MG Tabs tablet Commonly known as: XIFAXAN   rosuvastatin 5 MG tablet Commonly known as: CRESTOR     TAKE these medications   aspirin 81 MG tablet Take 81 mg by mouth daily.   clonazePAM 0.5 MG tablet Commonly known as: KLONOPIN Take 0.5 mg by mouth 2 (two) times daily.   docusate sodium 100 MG capsule Commonly known as: COLACE Take 100 mg by mouth 2 (two) times daily.   ferrous gluconate 324 MG tablet Commonly known as: FERGON Take 1 tablet (324 mg total) by mouth daily.   glucose blood test strip Commonly known as: ONE TOUCH ULTRA TEST USE ONE STRIP TO CHECK GLUCOSE TWICE DAILY   levETIRAcetam 500 MG tablet Commonly known as: KEPPRA Take 500 mg by mouth 2 (two) times daily.   lisinopril 10 MG tablet Commonly known as: ZESTRIL Take 1 tablet (10 mg total) by mouth daily.   metFORMIN 1000 MG tablet Commonly known as: GLUCOPHAGE Take 0.5 tablets (500 mg total) by mouth 2 (two) times daily. What changed: how much to take  SUMAtriptan 100 MG tablet Commonly known as: IMITREX Take 1 tablet (100 mg total) by mouth every 2 (two) hours as needed for migraine. May repeat in 2 hours if headache persists or recurs.   Synthroid 100 MCG tablet Generic drug: levothyroxine Take 1 tablet (100 mcg total) by mouth daily before breakfast.   Vitamin D 50 MCG (2000 UT) Caps Take 2,000 Units by mouth daily.        DISCHARGE INSTRUCTIONS:   Follow-up PMD 5 days Has follow-up with Dr. Tasia Olsen tomorrow  If you experience worsening of your admission symptoms, develop shortness of breath, life threatening emergency, suicidal or  homicidal thoughts you must seek medical attention immediately by calling 911 or calling your MD immediately  if symptoms less severe.  You Must read complete instructions/literature along with all the possible adverse reactions/side effects for all the Medicines you take and that have been prescribed to you. Take any new Medicines after you have completely understood and accept all the possible adverse reactions/side effects.   Please note  You were cared for by a hospitalist during your hospital stay. If you have any questions about your discharge medications or the care you received while you were in the hospital after you are discharged, you can call the unit and asked to speak with the hospitalist on call if the hospitalist that took care of you is not available. Once you are discharged, your primary care physician will handle any further medical issues. Please note that NO REFILLS for any discharge medications will be authorized once you are discharged, as it is imperative that you return to your primary care physician (or establish a relationship with a primary care physician if you do not have one) for your aftercare needs so that they can reassess your need for medications and monitor your lab values.    Today   CHIEF COMPLAINT:   Chief Complaint  Patient presents with  . Near Syncope    HISTORY OF PRESENT ILLNESS:  Leslie Olsen  is a 70 y.o. female came in with near syncopal episode   VITAL SIGNS:  Blood pressure 139/87, pulse 76, temperature 98.1 F (36.7 C), temperature source Oral, resp. rate 18, height 5\' 6"  (1.676 m), weight 84.4 kg, SpO2 98 %.  PHYSICAL EXAMINATION:  GENERAL:  69 y.o.-year-old patient lying in the bed with no acute distress.  EYES: Pupils equal, round, reactive to light and accommodation. No scleral icterus. Extraocular muscles intact.  HEENT: Head atraumatic, normocephalic. Oropharynx and nasopharynx clear.  LUNGS: Normal breath sounds bilaterally,  no wheezing, rales,rhonchi or crepitation. No use of accessory muscles of respiration.  CARDIOVASCULAR: S1, S2 normal. No murmurs, rubs, or gallops.  ABDOMEN: Soft, non-tender, non-distended. Bowel sounds present. No organomegaly or mass.  EXTREMITIES: No pedal edema.  NEUROLOGIC: Cranial nerves II through XII are intact. Muscle strength 5/5 in all extremities.  PSYCHIATRIC: The patient is alert and oriented x 3.  SKIN: No obvious rash, lesion, or ulcer.   DATA REVIEW:   CBC Recent Labs  Lab 04/14/20 0320  WBC 4.0  HGB 9.7*  HCT 29.2*  PLT 227    Chemistries  Recent Labs  Lab 04/12/20 1736 04/12/20 1736 04/13/20 0740 04/13/20 0740 04/14/20 0320  NA 140   < > 138   < > 137  K 4.6   < > 5.1   < > 4.6  CL 104   < > 104   < > 106  CO2 28   < > 23   < >  24  GLUCOSE 133*   < > 119*   < > 118*  BUN 29*   < > 27*   < > 24*  CREATININE 1.24*   < > 1.04*   < > 1.10*  CALCIUM 9.6   < > 9.8   < > 9.1  MG  --   --  2.2  --   --   AST 19  --   --   --   --   ALT 14  --   --   --   --   ALKPHOS 71  --   --   --   --   BILITOT 0.6  --   --   --   --    < > = values in this interval not displayed.   High-sensitivity troponin 3 and 5.  Both negative   Microbiology Results  Results for orders placed or performed during the hospital encounter of 04/13/20  SARS Coronavirus 2 by RT PCR (hospital order, performed in [facility] hospital lab) Nasopharyngeal Nasopharyngeal Swab     Status: None   Collection Time: 04/13/20  7:40 AM   Specimen: Nasopharyngeal Swab  Result Value Ref Range Status   SARS Coronavirus 2 NEGATIVE NEGATIVE Final    Comment: (NOTE) SARS-CoV-2 target nucleic acids are NOT DETECTED.  The SARS-CoV-2 RNA is generally detectable in upper and lower respiratory specimens during the acute phase of infection. The lowest concentration of SARS-CoV-2 viral copies this assay can detect is 250 copies / mL. A negative result does not preclude SARS-CoV-2 infection and  should not be used as the sole basis for treatment or other patient management decisions.  A negative result may occur with improper specimen collection / handling, submission of specimen other than nasopharyngeal swab, presence of viral mutation(s) within the areas targeted by this assay, and inadequate number of viral copies (<250 copies / mL). A negative result must be combined with clinical observations, patient history, and epidemiological information.  Fact Sheet for Patients:   StrictlyIdeas.no  Fact Sheet for Healthcare Providers: BankingDealers.co.za  This test is not yet approved or  cleared by the [place] FDA and has been authorized for detection and/or diagnosis of SARS-CoV-2 by FDA under an Emergency Use Authorization (EUA).  This EUA will remain in effect (meaning this test can be used) for the duration of the COVID-19 declaration under Section 564(b)(1) of the Act, 21 U.S.C. section 360bbb-3(b)(1), unless the authorization is terminated or revoked sooner.  Performed at [facility], [address], [place], [place] [address]     RADIOLOGY:  CT Angio Chest PE W and/or Wo Contrast  Result Date: 04/13/2020 CLINICAL DATA:  Near syncope, dizziness, diaphoresis. Breast cancer. EXAM: CT ANGIOGRAPHY CHEST WITH CONTRAST TECHNIQUE: Multidetector CT imaging of the chest was performed using the standard protocol during bolus administration of intravenous contrast. Multiplanar CT image reconstructions and MIPs were obtained to evaluate the vascular anatomy. CONTRAST:  47mL OMNIPAQUE IOHEXOL 350 MG/ML SOLN COMPARISON:  CT chest dated 05/30/2019. FINDINGS: Cardiovascular: Satisfactory opacification of the bilateral pulmonary arteries to the segmental level. No evidence of pulmonary embolism. Although not tailored for evaluation of the thoracic aorta, there is no evidence of thoracic aortic aneurysm or dissection.  Atherosclerotic calcifications of the aortic arch. The heart is top-normal in size.  No pericardial effusion. Three vessel coronary atherosclerosis. Mediastinum/Nodes: No suspicious mediastinal, hilar, or axillary lymphadenopathy. Visualized thyroid is unremarkable. Lungs/Pleura: Mild subpleural reticulation/fibrosis in the lungs bilaterally, suggesting  mild chronic interstitial lung disease. Mild centrilobular and paraseptal emphysematous changes, upper lung predominant. Stable 7 mm perifissural nodule along the minor fissure (series 6/image 41). Stable 5 mm subpleural nodule in the left lower lobe (series 6/image 63). 7 mm subpleural nodular opacity in the posterior right lower lobe (series 6/image 53), slightly more conspicuous than on the prior, but likely grossly unchanged. No suspicious pulmonary nodules. No focal consolidation. No pleural effusion or pneumothorax. Upper Abdomen: Visualized upper abdomen is notable for prior cholecystectomy, a 2.3 cm left adrenal adenoma, and vascular calcifications. Musculoskeletal: Postprocedural changes in the left breast (series 4/images 48 through 51). Mild degenerative changes of the visualized thoracolumbar spine. Review of the MIP images confirms the above findings. IMPRESSION: No evidence of pulmonary embolism. Postprocedural changes in the left breast. No findings suspicious for metastatic disease. Stable bilateral pulmonary nodules measuring up to 7 mm, likely benign. Aortic Atherosclerosis (ICD10-I70.0) and Emphysema (ICD10-J43.9). Electronically Signed   By: Julian Hy M.D.   On: 04/13/2020 08:47     Management plans discussed with the patient, and she is in agreement.  Patient deferred me calling family stating that she will call.  CODE STATUS:     Code Status Orders  (From admission, onward)         Start     Ordered   04/13/20 1036  Limited resuscitation (code)  Continuous       Question Answer Comment  In the event of cardiac or  respiratory ARREST: Initiate Code Blue, Call Rapid Response Yes   In the event of cardiac or respiratory ARREST: Perform CPR Yes   In the event of cardiac or respiratory ARREST: Perform Intubation/Mechanical Ventilation No   In the event of cardiac or respiratory ARREST: Use NIPPV/BiPAp only if indicated Yes   In the event of cardiac or respiratory ARREST: Administer ACLS medications if indicated Yes   In the event of cardiac or respiratory ARREST: Perform Defibrillation or Cardioversion if indicated Yes      04/13/20 1038        Code Status History    Date Active Date Inactive Code Status Order ID Comments User Context   11/03/2019 1437 11/06/2019 1907 Full Code [id number]  Ivor Costa, MD ED   07/31/2019 1126 08/01/2019 1424 Full Code [id number]  Isaias Cowman, MD Inpatient   Advance Care Planning Activity      TOTAL TIME TAKING CARE OF THIS PATIENT: 35 minutes.    Loletha Grayer M.D on 04/14/2020 at 3:25 PM  Between 7am to 6pm - Pager - [phone]  After 6pm go to [email or web address] - password [email or web address]  [facility]  CC: Primary care physician; Dr. Harrel Olsen

## 2020-04-14 NOTE — ED Notes (Signed)
Pt ambulatory to the bathroom without assist

## 2020-04-15 ENCOUNTER — Institutional Professional Consult (permissible substitution)
Admit: 2020-04-15 | Discharge: 2020-04-16 | Payer: PRIVATE HEALTH INSURANCE | Attending: Pharmacotherapy | Primary: Pharmacotherapy

## 2020-04-15 ENCOUNTER — Inpatient Hospital Stay: Payer: PPO

## 2020-04-15 ENCOUNTER — Other Ambulatory Visit: Payer: Self-pay

## 2020-04-15 ENCOUNTER — Inpatient Hospital Stay (HOSPITAL_BASED_OUTPATIENT_CLINIC_OR_DEPARTMENT_OTHER): Payer: PPO | Admitting: Oncology

## 2020-04-15 ENCOUNTER — Encounter: Payer: Self-pay | Admitting: Oncology

## 2020-04-15 VITALS — BP 131/69 | HR 71 | Temp 96.9°F | Resp 16 | Wt 188.2 lb

## 2020-04-15 DIAGNOSIS — I2510 Atherosclerotic heart disease of native coronary artery without angina pectoris: Principal | ICD-10-CM

## 2020-04-15 DIAGNOSIS — D500 Iron deficiency anemia secondary to blood loss (chronic): Secondary | ICD-10-CM

## 2020-04-15 DIAGNOSIS — D509 Iron deficiency anemia, unspecified: Secondary | ICD-10-CM | POA: Diagnosis not present

## 2020-04-15 DIAGNOSIS — N1831 Chronic kidney disease, stage 3a: Secondary | ICD-10-CM

## 2020-04-15 MED ORDER — IRON SUCROSE 20 MG/ML IV SOLN
200.0000 mg | Freq: Once | INTRAVENOUS | Status: AC
  Administered 2020-04-15: 200 mg via INTRAVENOUS
  Filled 2020-04-15: qty 10

## 2020-04-15 MED ORDER — SODIUM CHLORIDE 0.9 % IV SOLN
INTRAVENOUS | Status: DC
  Filled 2020-04-15: qty 250

## 2020-04-15 MED ORDER — REPATHA SURECLICK 140 MG/ML SUBCUTANEOUS PEN INJECTOR
SUBCUTANEOUS | 3 refills | 0 days | Status: CP
Start: 2020-04-15 — End: ?
  Filled 2020-05-07: qty 2, 28d supply, fill #0

## 2020-04-15 NOTE — Progress Notes (Signed)
Patient presented to ER 3 days ago for near syncope episode and was d/c yesterday.  No more syncope episodes but does feel tired and weak today.

## 2020-04-15 NOTE — Progress Notes (Signed)
Hematology/Oncology  note [facility] Telephone:([phone] [phone]   Patient Care Team: [place] as PCP - General Lucilla Lame, MD as Consulting Physician (Gastroenterology) Anabel Bene, MD as Referring Physician (Neurology) Theodore Demark, RN as Oncology Nurse Navigator Earlie Server, MD as Consulting Physician (Oncology)  REFERRING PROVIDER:  CHIEF COMPLAINTS/REASON FOR VISIT:  Evaluation for iron deficiency anemia  HISTORY OF PRESENTING ILLNESS:  Leslie Olsen is a  69 y.o.  female with PMH listed below who was referred to me for evaluation of breast cancer  Patient had screening mammogram on 10/24/2019 and mammogram showed a possible asymmetry in the left which warrants further evaluation.  Diagnostic left breast and ultrasound was done 32/2021 and showed suspicious mass 7x4x48m with possible associated distortion in the left breast at 5o'clock.  No evidence of left axillary lymphadenopathy. Left breast mass was biopsied and pathology showed: Invasive mammary carcinoma no special type, DCIS present, intermediate grade.  ER 91-100%, PR 81-90%, HER2 negative  Nipple discharge: Denies Menarche 69years old Age of first live birth was [birth year]Hysterectomy at 69years old  Family history: Maternal aunt with breast cancer at age of 558  Brother has lung cancer OCP use: denies.  Estrogen and progesterone therapy: 2 years of hormone replacement in her late 422s   History of radiation to chest: denies.  She has a history of radioactive iodine ablation Previous breast surgery: History of breast augmentation History of left breast biopsy in 1970 She denies any nipple discharge. Patient was seen by Surgery Dr.Sakai. She is on dural antiplatelet therapy due to drug eluting stent that was placed in November 2020. Cardiology does not clear patient to be off antiplatelet therapy due to risk of thrombosis.  Patient does not like the idea of  neoadjuvant endocrine therapy.  [facility] She does not like neoadjuvant endocrine therapy and [facility]  01/19/2020 she underwent left breast partial mastectomy, pathology showed invasive adenocarcinoma, pT1b pNx , DCIS present, margin negative. ER+, PR + HER2 -. Patient underwent adjuvant radiation.  [facility] due to her risk.     INTERVAL HISTORY Leslie BOTTGERis a 69y.o. female who has above history reviewed by me today referred by primary care provider to me for evaluation of anemia.  Problems and complaints are listed below:   Patient continues to feel very tired and fatigued. She went to emergency room few days ago due to near syncope event.  She had blood work done in the emergency room which showed Hemoglobin 9.7, a few days prior to her presentation to the emergency room, she had another problem which showed a hemoglobin of 11.  Patient has received multiple doses of IV Venofer treatments. Denies seeing any black or bloody stool.   Review of Systems  Constitutional: Positive for fatigue. Negative for appetite change, chills and fever.  HENT:   Negative for hearing loss and voice change.   Eyes: Negative for eye problems.  Respiratory: Negative for chest tightness, cough and shortness of breath.   Cardiovascular: Negative for chest pain.  Gastrointestinal: Negative for abdominal distention, abdominal pain and blood in stool.  Endocrine: Negative for hot flashes.  Genitourinary: Negative for difficulty urinating and frequency.   Musculoskeletal: Positive for arthralgias.  Skin: Negative for itching and rash.  Neurological: Negative for extremity weakness.  Hematological: Negative for adenopathy.  Psychiatric/Behavioral: Negative for confusion.    MEDICAL HISTORY:  Past Medical History:  Diagnosis Date   Anemia    CAD (coronary artery  disease)    stent in 2017, Nov 2020   Cancer [facility]) 11/2019   Breast cancer    Cephalalgia 04/08/2015   Chronic headaches    COPD (chronic obstructive pulmonary disease) (HCC)    Diabetes mellitus without complication (HCC)    Fatigue due to excessive exertion 08/22/2018   GERD (gastroesophageal reflux disease)    Hyperlipidemia    Hypertension    Hypothyroidism    [facility] spotted fever) 07/27/2016   Sleep apnea     SURGICAL HISTORY: Past Surgical History:  Procedure Laterality Date   ABDOMINAL HYSTERECTOMY     BACK SURGERY     BREAST BIOPSY Left 11/15/2019   [place] bx , heart marker   , path pending   BREAST EXCISIONAL BIOPSY Left 1970's   CHOLECYSTECTOMY     COLONOSCOPY N/A 11/05/2019   Procedure: COLONOSCOPY;  Surgeon: Toledo, Benay Pike, MD;  Location: [facility] ENDOSCOPY;  Service: Gastroenterology;  Laterality: N/A;   CORONARY STENT INTERVENTION N/A 07/31/2019   Procedure: CORONARY STENT INTERVENTION;  Surgeon: Isaias Cowman, MD;  Location: [place] CV LAB;  Service: Cardiovascular;  Laterality: N/A;  RCA   CORONARY STENT PLACEMENT     ESOPHAGOGASTRODUODENOSCOPY N/A 11/05/2019   Procedure: ESOPHAGOGASTRODUODENOSCOPY (EGD);  Surgeon: Toledo, Benay Pike, MD;  Location: [facility] ENDOSCOPY;  Service: Gastroenterology;  Laterality: N/A;   ESOPHAGOGASTRODUODENOSCOPY (EGD) WITH PROPOFOL N/A 05/26/2016   Procedure: ESOPHAGOGASTRODUODENOSCOPY (EGD) WITH PROPOFOL;  Surgeon: Lucilla Lame, MD;  Location: [facility] ENDOSCOPY;  Service: Endoscopy;  Laterality: N/A;   GIVENS CAPSULE STUDY N/A 05/05/2016   Procedure: GIVENS CAPSULE STUDY;  Surgeon: Lucilla Lame, MD;  Location: [facility] ENDOSCOPY;  Service: Endoscopy;  Laterality: N/A;   JOINT REPLACEMENT     LEFT HEART CATH AND CORONARY ANGIOGRAPHY Left 07/31/2019   Procedure: LEFT HEART CATH AND CORONARY ANGIOGRAPHY;  Surgeon: Isaias Cowman, MD;  Location: [place] CV LAB;  Service: Cardiovascular;   Laterality: Left;   REDUCTION MAMMAPLASTY     REPLACEMENT TOTAL KNEE     SHOULDER SURGERY     TIBIA FRACTURE SURGERY      SOCIAL HISTORY: Social History   Socioeconomic History   Marital status: Divorced    Spouse name: Not on file   Number of children: 1   Years of education: 12   Highest education level: 12th grade  Occupational History   Occupation: retired   Tobacco Use   Smoking status: Former Smoker    Packs/day: 2.00    Years: 45.00    Pack years: 90.00    Types: Cigarettes    Quit date: 11/06/2011    Years since quitting: 8.4   Smokeless tobacco: Never Used  Vaping Use   Vaping Use: Never used  Substance and Sexual Activity   Alcohol use: No   Drug use: No   Sexual activity: Never  Other Topics Concern   Not on file  Social History Narrative   Lives alone.  She has one grown son.   She works as a Retail buyer at [employer]   Highest level of education was high school   Social Determinants of Health   Financial Resource Strain:    Difficulty of Paying Living Expenses:   Food Insecurity:    Worried About Charity fundraiser in the Last Year:    Arboriculturist in the Last Year:  Transportation Needs:    Film/video editor (Medical):    Lack of Transportation (Non-Medical):   Physical Activity:    Days of Exercise per Week:    Minutes of Exercise per Session:   Stress:    Feeling of Stress :   Social Connections:    Frequency of Communication with Friends and Family:    Frequency of Social Gatherings with Friends and Family:    Attends Religious Services:    Active Member of Clubs or Organizations:    Attends Music therapist:    Marital Status:   Intimate Partner Violence:    Fear of Current or Ex-Partner:    Emotionally Abused:    Physically Abused:    Sexually Abused:     FAMILY HISTORY: Family History  Problem Relation Age of Onset   Lung cancer Maternal Aunt    Breast cancer  Maternal Aunt 57   Other Mother        Killed in car accident, 76   Other Father        Killed in car accident, 39   Thyroid disease Brother    Thyroid disease Sister     ALLERGIES:  has No Known Allergies.  MEDICATIONS:  Current Outpatient Medications  Medication Sig Dispense Refill   aspirin 81 MG tablet Take 81 mg by mouth daily.     Cholecalciferol (VITAMIN D) 50 MCG (2000 UT) CAPS Take 2,000 Units by mouth daily.      clonazePAM (KLONOPIN) 0.5 MG tablet Take 0.5 mg by mouth 2 (two) times daily.      docusate sodium (COLACE) 100 MG capsule Take 100 mg by mouth 2 (two) times daily.     ferrous gluconate (FERGON) 324 MG tablet Take 1 tablet (324 mg total) by mouth daily.  11   glucose blood (ONE TOUCH ULTRA TEST) test strip USE ONE STRIP TO CHECK GLUCOSE TWICE DAILY 100 each 12   levETIRAcetam (KEPPRA) 500 MG tablet Take 500 mg by mouth 2 (two) times daily.      lisinopril (ZESTRIL) 10 MG tablet Take 1 tablet (10 mg total) by mouth daily. (Patient taking differently: Take 20 mg by mouth daily. ) 30 tablet 0   metFORMIN (GLUCOPHAGE) 1000 MG tablet Take 0.5 tablets (500 mg total) by mouth 2 (two) times daily. 60 tablet 0   SUMAtriptan (IMITREX) 100 MG tablet Take 1 tablet (100 mg total) by mouth every 2 (two) hours as needed for migraine. May repeat in 2 hours if headache persists or recurs. 10 tablet 12   SYNTHROID 100 MCG tablet Take 1 tablet (100 mcg total) by mouth daily before breakfast. 90 tablet 3   No current facility-administered medications for this visit.   Facility-Administered Medications Ordered in Other Visits  Medication Dose Route Frequency Provider Last Rate Last Admin   0.9 %  sodium chloride infusion   Intravenous Continuous Earlie Server, MD   Stopped at 04/15/20 1504     PHYSICAL EXAMINATION: ECOG PERFORMANCE STATUS: 1 - Symptomatic but completely ambulatory Vitals:   04/15/20 1315  BP: 131/69  Pulse: 71  Resp: 16  Temp: (!) 96.9 F (36.1 C)     Filed Weights   04/15/20 1315  Weight: 188 lb 3.2 oz (85.4 kg)   Physical Exam Constitutional:      General: She is not in acute distress.    Appearance: She is not diaphoretic.  HENT:     Head: Normocephalic and atraumatic.     Nose: Nose  normal.     Mouth/Throat:     Pharynx: No oropharyngeal exudate.  Eyes:     General: No scleral icterus.    Pupils: Pupils are equal, round, and reactive to light.  Cardiovascular:     Rate and Rhythm: Normal rate and regular rhythm.     Heart sounds: No murmur heard.   Pulmonary:     Effort: Pulmonary effort is normal. No respiratory distress.     Breath sounds: No rales.  Chest:     Chest wall: No tenderness.  Abdominal:     General: There is no distension.     Palpations: Abdomen is soft.     Tenderness: There is no abdominal tenderness.  Musculoskeletal:        General: Normal range of motion.     Cervical back: Normal range of motion and neck supple.  Skin:    General: Skin is warm and dry.     Findings: No erythema.  Neurological:     Mental Status: She is alert and oriented to person, place, and time.     Cranial Nerves: No cranial nerve deficit.     Motor: No abnormal muscle tone.     Coordination: Coordination normal.  Psychiatric:        Mood and Affect: Affect normal.       LABORATORY DATA:  I have reviewed the data as listed Lab Results  Component Value Date   WBC 4.0 04/14/2020   HGB 9.7 (L) 04/14/2020   HCT 29.2 (L) 04/14/2020   MCV 91.0 04/14/2020   PLT 227 04/14/2020   Recent Labs    01/03/20 1059 01/03/20 1059 02/13/20 1330 02/13/20 1330 04/12/20 1736 04/13/20 0740 04/14/20 0320  NA 140   < > 139   < > 140 138 137  K 4.8   < > 5.2*   < > 4.6 5.1 4.6  CL 106   < > 104   < > 104 104 106  CO2 25   < > 26   < > _0 GLUCOSE 125*   < > 92   < > 133* 119* 118*  BUN 22   < > 30*   < > 29* 27* 24*  CREATININE 1.07*   < > 1.06*   < > 1.24* 1.04* 1.10*  CALCIUM 9.2   < > 9.8   < > 9.6 9.8 9.1   GFRNONAA 53*   < > 53*   < > 44* 54* 50*  GFRAA >60   < > >60   < > 51* >60 58*  PROT 7.3  --  8.4*  --  7.7  --   --   ALBUMIN 3.9  --  4.5  --  4.2  --   --   AST 18  --  21  --  19  --   --   ALT 13  --  17  --  14  --   --   ALKPHOS 64  --  78  --  71  --   --   BILITOT 0.4  --  0.5  --  0.6  --   --    < > = values in this interval not displayed.   Iron/TIBC/Ferritin/ %Sat    Component Value Date/Time   IRON 44 04/12/2020 1110   IRON 17 (L) 11/02/2019 1008   IRON 48 12/13/2014 1455   TIBC 343 04/12/2020 1110   TIBC 353 11/02/2019 1008  TIBC 378 12/13/2014 1455   FERRITIN 19 04/12/2020 1110   FERRITIN 15 11/02/2019 1008   FERRITIN 11 12/13/2014 1455   IRONPCTSAT 13 04/12/2020 1110   IRONPCTSAT 5 (LL) 11/02/2019 1008   IRONPCTSAT 12.7 12/13/2014 1455        ASSESSMENT & PLAN:  1. Iron deficiency anemia due to chronic blood loss   2. Stage 3a chronic kidney disease   Cancer Staging Malignant neoplasm of female breast [facility]) Staging form: Breast, AJCC 8th Edition - Clinical stage from 11/24/2019: Stage IA (cT1b, cN0, cM0, G1, ER+, PR+, HER2-) - Signed by Earlie Server, MD on 11/24/2019  #Iron deficiency anemia Labs reviewed and discussed with patient 04/12/2020, hemoglobin 11, iron saturation 13, ferritin 19.  Iron level showed slightly decreased comparing to June 2021. In 2 days when she repeat blood work in the emergency room which showed CBC of 9.7 I suspect that she has ongoing blood loss.  She does have a history of AVM. I urged patient to contact gastroenterology office Dr. Verl Blalock for further evaluation.  She needs to have small bowel capsule study From hematology aspect, recommend patient to proceed with Venofer 200 mg weekly x4. Repeat blood work in 3 months for evaluation of need of additional IV Venofer treatments She may have a component of anemia secondary to chronic kidney disease.  Erythropoietin therapy would not be effective until iron panel being fully  replete. #Invasive breast cancer, stage IA: She has switched her oncology care to [facility]. Defer to [facility] oncology for management.   Stage IIIA CKD Multiple myeloma panel showed no M protein.  Increase matching ratio.  Check UPEP and immunofixation.   All questions were answered. The patient knows to call the clinic with any problems questions or concerns.  Return of visit: 8 weeks    Earlie Server, MD, PhD Hematology Oncology [facility] Pager- [phone] 04/15/2020

## 2020-04-16 DIAGNOSIS — I2510 Atherosclerotic heart disease of native coronary artery without angina pectoris: Principal | ICD-10-CM

## 2020-04-16 DIAGNOSIS — D509 Iron deficiency anemia, unspecified: Secondary | ICD-10-CM | POA: Diagnosis not present

## 2020-04-17 ENCOUNTER — Other Ambulatory Visit: Payer: Self-pay

## 2020-04-17 DIAGNOSIS — Z09 Encounter for follow-up examination after completed treatment for conditions other than malignant neoplasm: Secondary | ICD-10-CM | POA: Diagnosis not present

## 2020-04-17 DIAGNOSIS — R55 Syncope and collapse: Secondary | ICD-10-CM | POA: Diagnosis not present

## 2020-04-17 DIAGNOSIS — E119 Type 2 diabetes mellitus without complications: Secondary | ICD-10-CM | POA: Diagnosis not present

## 2020-04-17 DIAGNOSIS — Z170 Estrogen receptor positive status [ER+]: Secondary | ICD-10-CM | POA: Diagnosis not present

## 2020-04-17 DIAGNOSIS — I10 Essential (primary) hypertension: Secondary | ICD-10-CM | POA: Diagnosis not present

## 2020-04-17 DIAGNOSIS — D500 Iron deficiency anemia secondary to blood loss (chronic): Secondary | ICD-10-CM

## 2020-04-17 DIAGNOSIS — E039 Hypothyroidism, unspecified: Secondary | ICD-10-CM | POA: Diagnosis not present

## 2020-04-17 DIAGNOSIS — E785 Hyperlipidemia, unspecified: Secondary | ICD-10-CM | POA: Diagnosis not present

## 2020-04-17 DIAGNOSIS — J449 Chronic obstructive pulmonary disease, unspecified: Secondary | ICD-10-CM | POA: Diagnosis not present

## 2020-04-17 DIAGNOSIS — D509 Iron deficiency anemia, unspecified: Secondary | ICD-10-CM | POA: Diagnosis not present

## 2020-04-17 DIAGNOSIS — C50512 Malignant neoplasm of lower-outer quadrant of left female breast: Secondary | ICD-10-CM | POA: Diagnosis not present

## 2020-04-19 LAB — UPEP/UIFE/LIGHT CHAINS/TP, 24-HR UR
% BETA, Urine: 0 %
ALPHA 1 URINE: 0 %
Albumin, U: 0 %
Alpha 2, Urine: 0 %
Free Kappa Lt Chains,Ur: 27.84 mg/L (ref 0.63–113.79)
Free Kappa/Lambda Ratio: 10.92 (ref 1.03–31.76)
Free Lambda Lt Chains,Ur: 2.55 mg/L (ref 0.47–11.77)
GAMMA GLOBULIN URINE: 0 %
Total Protein, Urine-Ur/day: 129 mg/24 hr (ref 30–150)
Total Protein, Urine: 4.7 mg/dL
Total Volume: 2750

## 2020-04-23 ENCOUNTER — Ambulatory Visit: Admit: 2020-04-23 | Discharge: 2020-04-24 | Payer: PRIVATE HEALTH INSURANCE

## 2020-04-23 DIAGNOSIS — I2510 Atherosclerotic heart disease of native coronary artery without angina pectoris: Principal | ICD-10-CM

## 2020-04-25 ENCOUNTER — Inpatient Hospital Stay: Payer: PPO

## 2020-04-25 ENCOUNTER — Other Ambulatory Visit: Payer: Self-pay

## 2020-04-25 VITALS — BP 116/64 | HR 82 | Temp 96.1°F | Resp 18

## 2020-04-25 DIAGNOSIS — D649 Anemia, unspecified: Secondary | ICD-10-CM

## 2020-04-25 DIAGNOSIS — D500 Iron deficiency anemia secondary to blood loss (chronic): Secondary | ICD-10-CM

## 2020-04-25 DIAGNOSIS — D509 Iron deficiency anemia, unspecified: Secondary | ICD-10-CM | POA: Diagnosis not present

## 2020-04-25 MED ORDER — SODIUM CHLORIDE 0.9 % IV SOLN
Freq: Once | INTRAVENOUS | Status: AC
  Filled 2020-04-25: qty 250

## 2020-04-25 MED ORDER — IRON SUCROSE 20 MG/ML IV SOLN
200.0000 mg | Freq: Once | INTRAVENOUS | Status: AC
  Administered 2020-04-25: 200 mg via INTRAVENOUS
  Filled 2020-04-25: qty 10

## 2020-04-25 NOTE — Progress Notes (Signed)
Pt tolerated infusion well. No s/s of reaction or distress noted. Pt and VS stable at discharge.  

## 2020-05-01 MED ORDER — EMPTY CONTAINER
2 refills | 0 days
Start: 2020-05-01 — End: ?

## 2020-05-01 NOTE — Unmapped (Signed)
[facility]   Patient Onboarding/Medication Counseling    Amber Duncan is a 69 y.o. female with hyperlipidemia who I am counseling today on initiation of therapy.  I am speaking to the patient.    Was a Nurse, learning disability used for this call? No    Verified patient's date of birth / HIPAA.    Specialty medication(s) to be sent: General Specialty: Repatha      Non-specialty medications/supplies to be sent: Higher education careers adviser      Medications not needed at this time: n/a         Repatha (evolocumab)    Medication & Administration     Dosage: Inject the contents of 1 pen (140mg ) under the skin every 2 weeks.    Administration: Administer under the skin of the abdomen, thigh or upper arm. Rotate sites with each injection.  ??? Injection instructions - Autoinjector:  o Remove 1 Repatha autoinjector from the refrigerator and let stand at room temperature for at least 30 minutes.  o Check the autoinjector for the following:  - Expiration date  - Absence of any cracks or damage  - The medicine is clear and colorless and does not contain any particles  - The orange cap is present and securely attached  o Choose your injection site and clean with an alcohol wipe. Allow to air dry completely.  o Pull the orange cap straight off and discard  o Pinch the skin (or stretch) with your thumb and fingers creating an area 2 inches wide  o Maintaining the pinch (or stretch) press the pen to your skin at a 90 degree angle. Firmly push the autoinjector down until the skin stops moving and the yellow safety guard is no longer visible.  o Do not touch the gray start button yet  o When you are ready to inject, press the gray start button. You will hear a click that signals the start of the injection  o Continue to press the pen to your skin and lift your thumb  o The injection may take up to 15 seconds. You will know the injection is complete when the medication window turns yellow. You may also hear a second click.  o Remove the pen from your skin and discard the pen in a sharps container.  o If there is blood at the injection site, press a cotton ball or gauze to the site. Do not rub the injection site.    Adherence/Missed dose instructions: Administer a missed dose within 7 days and resume your normal schedule.  If it has been more than 7 days and you inject every 2 weeks, skip the missed dose and resume your normal schedule..     Goals of Therapy     Lower cholesterol, prevention of cardiovascular events in patients with established cardiovascular disease    Side Effects & Monitoring Parameters   ??? Flu-like symptoms  ??? Signs of a common cold  ??? Back pain  ??? Injection site irritation  ??? Nose or throat irritation    The following side effects should be reported to the provider:  ??? Signs of an allergic reaction  ??? Signs of high blood sugar (confusion, drowsiness, increase thirst/hunger/urination, fast breathing, flushing)      Contraindications, Warnings, & Precautions     ??? Latex (the packaging of Repatha may contain natural rubber)    Drug/Food Interactions     ??? Medication list reviewed in Epic. The patient was instructed to inform the care  team before taking any new medications or supplements. No drug interactions identified.     Storage, Handling Precautions, & Disposal   ??? Repatha should be stored in the refrigerator. If necessary, Repatha may be kept at room temperature for no more than 30 days.  ??? Place used devices in a sharps container for disposal.      Current Medications (including OTC/herbals), Comorbidities and Allergies     Current Outpatient Medications   Medication Sig Dispense Refill   ??? aspirin-calcium carbonate 81 mg-300 mg calcium(777 mg) Tab Take 81 mg by mouth.     ??? clonazePAM (KLONOPIN) 0.5 MG tablet Take 1 tablet (0.5 mg total) by mouth two (2) times a day as needed for anxiety. 180 tablet 0   ??? docusate sodium (COLACE) 100 MG capsule Take 100 mg by mouth daily.     ??? evolocumab (REPATHA SURECLICK) 140 mg/mL PnIj Inject the contents of 1 pen (140 mg) under the skin every fourteen (14) days. 2 mL 3   ??? ferrous gluconate 324 MG tablet Take 324 mg by mouth once.     ??? levETIRAcetam (KEPPRA) 500 MG tablet Take 1 tablet (500 mg total) by mouth Two (2) times a day. 180 tablet 1   ??? levothyroxine (SYNTHROID) 100 MCG tablet Take 100 mcg by mouth.     ??? lisinopriL (PRINIVIL,ZESTRIL) 20 MG tablet Take 1 tablet (20 mg total) by mouth daily. 90 tablet 3   ??? metFORMIN (GLUCOPHAGE) 500 MG tablet Take 1,000 mg by mouth 2 (two) times a day with meals.      ??? SUMAtriptan (IMITREX) 100 MG tablet Take one tab at onset of headache, may repeat in 2 hours if headache persists of recurs 9 tablet 5     No current facility-administered medications for this visit.       No Known Allergies    Patient Active Problem List   Diagnosis   ??? Allergic rhinitis   ??? Anemia   ??? Coronary artery disease involving native coronary artery of native heart without angina pectoris   ??? Bilateral occipital neuralgia   ??? Cephalalgia   ??? Pulmonary emphysema (CMS-HCC)   ??? Diabetes mellitus without complication (CMS-HCC)   ??? Dyspnea   ??? Dysphonia spastica   ??? Essential hypertension   ??? Hoarseness or changing voice   ??? Mixed hyperlipidemia   ??? Iron deficiency anemia   ??? Multiple pulmonary nodules   ??? Obesity   ??? Hypothyroidism   ??? OSA on CPAP   ??? Voice resonance disorder   ??? Spasm of diaphragm   ??? Propriospinal myoclonus   ??? Angiodysplasia of intestine   ??? Weight loss   ??? Chronic fatigue   ??? Irritable bowel syndrome with diarrhea   ??? Malignant neoplasm of lower-inner quadrant of left breast in female, estrogen receptor positive (CMS-HCC)       Reviewed and up to date in Epic.    Appropriateness of Therapy     Is medication and dose appropriate based on diagnosis? Yes    Prescription has been clinically reviewed: Yes    Baseline Quality of Life Assessment      How many days over the past month did your high cholesterol  keep you from your normal activities? For example, brushing your teeth or getting up in the morning. 0    Financial Information     Medication Assistance provided: Prior Authorization and [employer]    Anticipated copay of $0 reviewed with patient. Verified delivery  address.    Delivery Information     Scheduled delivery date: 05/07/20    Expected start date: 05/07/20    Medication will be delivered via Same Day Courier to the prescription address in [facility].  This shipment will not require a signature.      Explained the services we provide at [facility] and that each month we would call to set up refills.  Stressed importance of returning phone calls so that we could ensure they receive their medications in time each month.  Informed patient that we should be setting up refills 7-10 days prior to when they will run out of medication.  A pharmacist will reach out to perform a clinical assessment periodically.  Informed patient that a welcome packet and a drug information handout will be sent.      Patient verbalized understanding of the above information as well as how to contact the pharmacy at [phone] option 4 with any questions/concerns.  The pharmacy is open Monday through Friday 8:30am-4:30pm.  A pharmacist is available 24/7 via pager to answer any clinical questions they may have.    Patient Specific Needs     - Does the patient have any physical, cognitive, or cultural barriers? No    - Patient prefers to have medications discussed with  Patient     - Is the patient or caregiver able to read and understand education materials at a high school level or above? Yes    - Patient's primary language is  English     - Is the patient high risk? No    - Does the patient require a Care Management Plan? No     - Does the patient require physician intervention or other additional services (i.e. nutrition, smoking cessation, social work)? No      [name]  [facility] Specialty Pharmacist

## 2020-05-01 NOTE — Unmapped (Signed)
[facility] CARDIOLOGY CLINIC  [address]  [place], [place] [address]  PHONE: [phone]  FAX: [phone]           CLINICAL PHARMACIST PRACTITIONER NOTE   LIPID THERAPY    Referring Provider: Ramond Marrow, MD   Cardiologist: Ramond Marrow, MD   Primary Care Provider: Gracelyn Nurse, MD    Assessment and Plan:     #ASCVD Risk Reduction Initially underwent PCI to RCA in 2008. Subsequently experienced exertional CP in Fall 2020. She underwent a myocardial perfusion scan followed by a left heart catheterization. She underwent placement of DES x1 to RCA on 07/31/19. She has a history of intolerance to multiple statins (myalgias), as well as marked hyperglycemia with rosuvastatin 5 mg daily. Today she is doing well and is without complaint. At last visit, a shared decision was made to start Repatha based on LDL of 184 mg/dL and known ASCVD. Amber Duncan reports that [facility] has reached out to her and her medication will be delivered 05/07/20. She plans to take her first dose on 05/08/20 and reports feeling confident about it. We made a plan to have her recheck lipid panel mid-October (after the third dose of Repatha) at [facility] and follow up the following week via phone. Amber Duncan verbalized understanding of the above plan and was given an opportunity to ask questions, all of which were answered.  ?? Key risk factors include: CAD, PCI, Hypertension, hemoglobin A1c 6.1%, BMI (30 kg/m2), Lipids (LDL 184 mg/dL, triglycerides 542 mg/dL) and former smoker, breast cancer  ?? Start Repatha 140 mg subcutaneous every 2 weeks (anticipated first dose 05/08/20)   ?? Continue aspirin 81 mg daily  ?? History of statin intolerance: she developed myalgias to multiple statins in the past, and became markedly hyperglycemic on rosuvastatin  ?? Repeat lipid panel after the third dose of PCSK9i as appropriate    #HTN  ?? Continue lisinopril 20 mg daily (increased from 10 mg daily on 04/09/20)  ?? Reports no issues with change of dose ?? Continue to monitor BP; goal BP <130/80 mm Hg    #Medication Adherence/Monitoring:   ?? Reviewed the indication, dose, and frequency of each medication with patient   ?? Medications reviewed in EPIC medication station and updated today by the clinical pharmacist practitioner.    #FOLLOW UP:   ?? lab check (lipid panel) week of 06/17/20 at [facility]  ?? repeat pharmacist phone visit on 06/24/20 at 9:30am      I spent a total of 7 minutes on the phone with the patient delivering clinical care and providing education/counseling.        [name], PharmD  PGY1 Pharmacy Resident       Donnamae Jude, PharmD, BCPS, [facility], CPP  Cardiology Clinical Pharmacist Practitioner  [facility]  [address]  [place], [place] [address]  Phone: [phone]  Fax: [phone]      History of Present Illness:   Amber Duncan is a 69 y.o. year old female with a history of CAD s/p PCI, HTN, HLD who presents today for consideration of PCSK9 inhibitor therapy at the request of Dr. Barbette Merino. Amber Duncan was diagnosed with CAD in 2008 and underwent PCI at that time. She had no further cardiac issues until Fall 2020 when she developed exertional chest tightness that radiated down her left arm. She underwent a myocardial perfusion scan followed by a left heart catheterization, during which she underwent PCI with DES x1 to the  RCA (07/31/19). Thereafter her exertional symptoms resolved and have not recurred. Amber Duncan has a history of statin intolerance, she has developed myalgias to multiple statins in the past, and became markedly hyperglycemic on rosuvastatin.     Since being seen in cardiology clinic on 04/09/20, Amber Duncan reported to [facility] ED with near syncope on 04/13/20. She improved with IV hydration and was not orthostatic. BP at that time was 139/87 mm Hg per CareEverywhere. She was discharged from the ED.    Amber Duncan reports doing well although she does complain of extreme fatigue which is chronic and stable. She completed radiation last month for breast cancer; no chemotherapy is currently planned.       Summary of Recent Visits and Key Medication Changes  ??? 02/06/20: Start rosuvastatin 5 mg tablet once daily   ?? 04/09/20: Stop rosuvastatin due to siginificant hyperglycemia  ?? 04/15/20: Start Rapatha 140 mg subcutaneous every 2 weeks (required manufacturer's assistance - first dose will be 05/08/20)     History of myalgias with several statins and marked hyperglycemia with rosuvastatin 5 mg daily (BGs increased from ~120-135 to 250-280 mg/dL). Currently not receiving lipid lowering therapy due to statin intolerance.      Interval History  She reports no dyspnea, orthopnea, paroxysmal nocturnal dyspnea, or LE edema. No palpitations, chest pain, dizziness, pre-syncope or syncope. She reports a good appetite without bloating, abd distension or early satiety.       Cardiovascular History & Procedures:  Cath / PCI:  07/31/19  ?? Mid Cx to Dist Cx lesion is 50% stenosed.   ?? Prox RCA lesion is 40% stenosed.   ?? Dist RCA lesion is 90% stenosed.   ?? Previously placed Mid RCA to Dist RCA stent (unknown type) is widely  patent.   ?? A drug-eluting stent was successfully placed using a STENT RESOLUTE ONYX  [id number].   ?? Post intervention, there is a 0% residual stenosis.     1. ??One-vessel CAD with patent stent mid to distal RCA with 90% stenosis   in distal native vessel just beyond the stent   2. ??Normal left ventricular function   3. ??Successful PCI with DES distal RCA  ??  PCI to RCA 2008  ??  CV Surgery:  None    EP Procedures and Devices:  None  ??  Non-Invasive Evaluation(s):  Echo:  07/20/19  NORMAL LEFT VENTRICULAR SYSTOLIC FUNCTION ?? WITH MILD LVH   NORMAL RIGHT VENTRICULAR SYSTOLIC FUNCTION   MILD VALVULAR REGURGITATION (See above)   NO VALVULAR STENOSIS   TRIVIAL MR, TR   MILD PR   EF 50-55%   ??  CT/MRI/Nuclear Tests:  Myocardial perfusion scan 07/20/19  1. ??Negative ETT with nonspecific ST abnormalities   2. ??Normal left ventricular function   3. ??Normal wall motion   4. ??Minimal to mild apical wall ischemia      Past Medical History:   Diagnosis Date   ??? Breast cancer (CMS-HCC)    ??? COPD (chronic obstructive pulmonary disease) (CMS-HCC)    ??? Coronary artery disease    ??? Diabetes (CMS-HCC)    ??? Disease of thyroid gland    ??? Fatigue    ??? Hepatitis    ??? Hepatitis    ??? Hyperlipidemia    ??? Hypertension    ??? Migraine    ??? Sleep apnea         Past Surgical History:   Procedure Laterality Date   ??? BACK SURGERY     ???  BREAST BIOPSY     ??? CATARACT EXTRACTION     ??? CHOLECYSTECTOMY     ??? CORONARY STENT PLACEMENT     ??? HYSTERECTOMY     ??? KNEE SURGERY     ??? PR COLONOSCOPY,ABLATE LESION N/A 01/21/2017    Procedure: COLONOSCOPY FLEX; W/ABLAT LES NOT AMENABLE-SNARE;  Surgeon: Luanne Bras, MD;  [facility];  Service: Gastroenterology   ??? PR COLSC FLX W/RMVL OF TUMOR POLYP LESION SNARE TQ N/A 01/21/2017    Procedure: COLONOSCOPY FLEX; W/REMOV TUMOR/LES BY SNARE;  Surgeon: Luanne Bras, MD;  Location: [facility];  Service: Gastroenterology   ??? PR MASTECTOMY, PARTIAL Left 01/19/2020    Procedure: MASTECTOMY, PARTIAL (EG, LUMPECTOMY, TYLECTOMY, QUADRANTECTOMY, SEGMENTECTOMY);  Surgeon: Talbert Cage, DO;  Location: MAIN OR [facility];  Service: Surgical Oncology Breast   ??? PR UPPER GI ENDOSCOPY,BIOPSY N/A 04/29/2018    Procedure: UGI ENDOSCOPY; WITH BIOPSY, SINGLE OR MULTIPLE;  Surgeon: Rona Ravens, MD;  [facility];  Service: Gastroenterology   ??? REDUCTION MAMMAPLASTY          Social & Family History  She  reports that she quit smoking about 8 years ago. Her smoking use included cigarettes. She has a 96.00 pack-year smoking history. She has never used smokeless tobacco. She reports that she does not drink alcohol and does not use drugs.     Family History   Problem Relation Age of Onset   ??? Cancer Brother    ??? Cancer Maternal Aunt    ??? Cancer Maternal Uncle    ??? Diabetes Paternal Uncle    ??? Heart failure Neg Hx         Medications:  Reviewed and updated. Medication list includes revisions made during today's encounter.    Current Outpatient Medications   Medication Sig Dispense Refill   ??? aspirin-calcium carbonate 81 mg-300 mg calcium(777 mg) Tab Take 81 mg by mouth.     ??? clonazePAM (KLONOPIN) 0.5 MG tablet Take 1 tablet (0.5 mg total) by mouth two (2) times a day as needed for anxiety. 180 tablet 0   ??? docusate sodium (COLACE) 100 MG capsule Take 100 mg by mouth daily.     ??? empty container (SHARPS-A-GATOR DISPOSAL SYSTEM) Misc Use as directed for sharps disposal 1 each 2   ??? evolocumab (REPATHA SURECLICK) 140 mg/mL PnIj Inject the contents of 1 pen (140 mg) under the skin every fourteen (14) days. 2 mL 3   ??? ferrous gluconate 324 MG tablet Take 324 mg by mouth once.     ??? levETIRAcetam (KEPPRA) 500 MG tablet Take 1 tablet (500 mg total) by mouth Two (2) times a day. 180 tablet 1   ??? levothyroxine (SYNTHROID) 100 MCG tablet Take 100 mcg by mouth.     ??? lisinopriL (PRINIVIL,ZESTRIL) 20 MG tablet Take 1 tablet (20 mg total) by mouth daily. 90 tablet 3   ??? metFORMIN (GLUCOPHAGE) 500 MG tablet Take 1,000 mg by mouth 2 (two) times a day with meals.      ??? SUMAtriptan (IMITREX) 100 MG tablet Take one tab at onset of headache, may repeat in 2 hours if headache persists of recurs 9 tablet 5     No current facility-administered medications for this visit.       Allergies/Adverse Events:  Patient has no known allergies.    Lab Results   Component Value Date    Creatinine Whole Blood, POC 0.8 07/10/2016  Creatinine 0.99 (H) 04/23/2020    Creatinine 1.00 (H) 04/09/2020    BUN 20 04/23/2020    BUN 22 04/09/2020    Potassium 5.1 (H) 04/23/2020    Potassium 4.7 (H) 04/09/2020    AST 16 04/09/2020    AST 30 05/26/2018    ALT 7 (L) 04/09/2020    ALT 29 05/26/2018    TSH 0.704 04/09/2020    Total Bilirubin 0.2 (L) 04/09/2020    Total Bilirubin 0.4 05/26/2018    INR 1.03 05/26/2018    INR 1.18 01/20/2017    WBC 4.8 05/26/2018    HGB 13.2 (L) 05/26/2018    HCT 40.1 05/26/2018    Platelet 248 05/26/2018    Triglycerides 297 (H) 04/09/2020    HDL 53 04/09/2020    Non-HDL Cholesterol 243 (H) 04/09/2020    LDL Calculated 184 (H) 04/09/2020         Vital Signs:  There were no vitals filed for this visit.    BP Readings from Last 3 Encounters:   04/09/20 143/79   02/22/20 128/71   02/21/20 116/61     Pulse Readings from Last 3 Encounters:   04/09/20 88   02/22/20 93   02/21/20 85     Wt Readings from Last 3 Encounters:   04/09/20 85.3 kg (188 lb)   02/22/20 84.3 kg (185 lb 12.8 oz)   02/12/20 84.5 kg (186 lb 3.2 oz)      BMI Readings from Last 3 Encounters:   04/09/20 30.34 kg/m??   02/22/20 30.01 kg/m??   02/12/20 30.07 kg/m??

## 2020-05-01 NOTE — Unmapped (Signed)
[facility] Specialty Medication Onboarding    Specialty Medication: Repatha Sureclick pens  Prior Authorization: Approved   Financial Assistance: Yes - grant approved as secondary   Final Copay/Day Supply: $0 / 28 days    Insurance Restrictions: Yes - max 1 month supply     Notes to Pharmacist:     The triage team has completed the benefits investigation and has determined that the patient is able to fill this medication at [facility]. Please contact the patient to complete the onboarding or follow up with the prescribing physician as needed.

## 2020-05-02 ENCOUNTER — Institutional Professional Consult (permissible substitution)
Admit: 2020-05-02 | Discharge: 2020-05-03 | Payer: PRIVATE HEALTH INSURANCE | Attending: Pharmacotherapy | Primary: Pharmacotherapy

## 2020-05-02 DIAGNOSIS — I2510 Atherosclerotic heart disease of native coronary artery without angina pectoris: Principal | ICD-10-CM

## 2020-05-02 NOTE — Unmapped (Addendum)
It was good speaking with you today.    As discussed:  ??? Start Repatha 140 mg every 2 weeks on 05/08/20   ??? Continue other medications  ??? Make sure medication list matches medications at home  ??? Lab check week of October 11th at [facility]  ??? Next phone visit October 18th at 9:30am   ??? Please let us know if you have any issues        Call the clinic at [phone] with questions.    Our clinic fax number is [phone].    If you need to reschedule future appointments, please call [phone] or [phone]    After office hours, if you have urgent questions/problems, contact the on-call cardiologist through the hospital operator: [phone].

## 2020-05-03 ENCOUNTER — Other Ambulatory Visit: Payer: Self-pay

## 2020-05-03 ENCOUNTER — Inpatient Hospital Stay: Payer: PPO

## 2020-05-03 VITALS — BP 133/76 | HR 98 | Resp 16

## 2020-05-03 DIAGNOSIS — D500 Iron deficiency anemia secondary to blood loss (chronic): Secondary | ICD-10-CM

## 2020-05-03 DIAGNOSIS — D509 Iron deficiency anemia, unspecified: Secondary | ICD-10-CM | POA: Diagnosis not present

## 2020-05-03 MED ORDER — SODIUM CHLORIDE 0.9 % IV SOLN
Freq: Once | INTRAVENOUS | Status: AC
  Filled 2020-05-03: qty 250

## 2020-05-03 MED ORDER — IRON SUCROSE 20 MG/ML IV SOLN
200.0000 mg | Freq: Once | INTRAVENOUS | Status: AC
  Administered 2020-05-03: 200 mg via INTRAVENOUS
  Filled 2020-05-03: qty 10

## 2020-05-07 MED FILL — EMPTY CONTAINER: 120 days supply | Qty: 1 | Fill #0

## 2020-05-07 MED FILL — EMPTY CONTAINER: 120 days supply | Qty: 1 | Fill #0 | Status: AC

## 2020-05-07 MED FILL — REPATHA SURECLICK 140 MG/ML SUBCUTANEOUS PEN INJECTOR: 28 days supply | Qty: 2 | Fill #0 | Status: AC

## 2020-05-10 ENCOUNTER — Other Ambulatory Visit: Payer: Self-pay

## 2020-05-10 ENCOUNTER — Inpatient Hospital Stay: Payer: PPO | Attending: Oncology

## 2020-05-10 VITALS — BP 131/74 | HR 82 | Temp 97.8°F | Resp 18

## 2020-05-10 DIAGNOSIS — C50919 Malignant neoplasm of unspecified site of unspecified female breast: Secondary | ICD-10-CM | POA: Insufficient documentation

## 2020-05-10 DIAGNOSIS — D500 Iron deficiency anemia secondary to blood loss (chronic): Secondary | ICD-10-CM | POA: Diagnosis not present

## 2020-05-10 MED ORDER — IRON SUCROSE 20 MG/ML IV SOLN
200.0000 mg | Freq: Once | INTRAVENOUS | Status: AC
  Administered 2020-05-10: 200 mg via INTRAVENOUS
  Filled 2020-05-10: qty 10

## 2020-05-10 MED ORDER — SODIUM CHLORIDE 0.9 % IV SOLN
Freq: Once | INTRAVENOUS | Status: AC
  Filled 2020-05-10: qty 250

## 2020-05-11 ENCOUNTER — Telehealth: Payer: Self-pay | Admitting: *Deleted

## 2020-05-11 NOTE — Telephone Encounter (Signed)
Pt notified that lung cancer screening imaging is due currently or in the near future. She is a former smoker now for 7 years. Appointment scheduled on 06/05/2020 at 11:30am.

## 2020-05-21 ENCOUNTER — Other Ambulatory Visit: Payer: Self-pay | Admitting: *Deleted

## 2020-05-21 DIAGNOSIS — Z122 Encounter for screening for malignant neoplasm of respiratory organs: Secondary | ICD-10-CM

## 2020-05-21 DIAGNOSIS — Z87891 Personal history of nicotine dependence: Secondary | ICD-10-CM

## 2020-05-21 NOTE — Progress Notes (Signed)
Former smoker, quit 11/2011, 90 pack year

## 2020-05-28 NOTE — Unmapped (Signed)
[facility] Clinical Assessment & Refill Coordination Note    Amber Duncan, DOB: June 11, 1951  Phone: [phone] (home)     All above HIPAA information was verified with patient.     Was a Nurse, learning disability used for this call? No    Specialty Medication(s):   General Specialty: Repatha     Current Outpatient Medications   Medication Sig Dispense Refill   ??? aspirin-calcium carbonate 81 mg-300 mg calcium(777 mg) Tab Take 81 mg by mouth.     ??? clonazePAM (KLONOPIN) 0.5 MG tablet Take 1 tablet (0.5 mg total) by mouth two (2) times a day as needed for anxiety. 180 tablet 0   ??? docusate sodium (COLACE) 100 MG capsule Take 100 mg by mouth daily.     ??? empty container (SHARPS-A-GATOR DISPOSAL SYSTEM) Misc Use as directed for sharps disposal 1 each 2   ??? evolocumab (REPATHA SURECLICK) 140 mg/mL PnIj Inject the contents of 1 pen (140 mg) under the skin every fourteen (14) days. 2 mL 3   ??? ferrous gluconate 324 MG tablet Take 324 mg by mouth once.     ??? levETIRAcetam (KEPPRA) 500 MG tablet Take 1 tablet (500 mg total) by mouth Two (2) times a day. 180 tablet 1   ??? levothyroxine (SYNTHROID) 100 MCG tablet Take 100 mcg by mouth.     ??? lisinopriL (PRINIVIL,ZESTRIL) 20 MG tablet Take 1 tablet (20 mg total) by mouth daily. 90 tablet 3   ??? metFORMIN (GLUCOPHAGE) 500 MG tablet Take 1,000 mg by mouth 2 (two) times a day with meals.      ??? SUMAtriptan (IMITREX) 100 MG tablet Take one tab at onset of headache, may repeat in 2 hours if headache persists of recurs 9 tablet 5     No current facility-administered medications for this visit.        Changes to medications: Amber Duncan.    No Known Allergies    Changes to allergies: No    SPECIALTY MEDICATION ADHERENCE     Repatha 140 mg/ml: 0 days of medicine on hand       Medication Adherence    Patient reported X missed doses in the last month: 0  Specialty Medication: Repatha 140 mg/mL  Informant: patient          Specialty medication(s) dose(s) confirmed: Regimen is correct and unchanged.     Are there any concerns with adherence? No    Adherence counseling provided? Not needed    CLINICAL MANAGEMENT AND INTERVENTION      Clinical Benefit Assessment:    Do you feel the medicine is effective or helping your condition? Yes    Clinical Benefit counseling provided? Not needed. Pt has follow up visit and labs scheduled for mid-October    Adverse Effects Assessment:    Are you experiencing any side effects? No    Are you experiencing difficulty administering your medicine? No    Quality of Life Assessment:    How many days over the past month did your high cholesterol  keep you from your normal activities? For example, brushing your teeth or getting up in the morning. 0    Have you discussed this with your provider? Not needed    Therapy Appropriateness:    Is therapy appropriate? Yes, therapy is appropriate and should be continued    DISEASE/MEDICATION-SPECIFIC INFORMATION      For patients on injectable medications: Patient currently has 0 doses left.  Next injection is scheduled for 06/05/20.    PATIENT SPECIFIC NEEDS     - Does the patient have any physical, cognitive, or cultural barriers? No    - Is the patient high risk? No    - Does the patient require a Care Management Plan? No     - Does the patient require physician intervention or other additional services (i.e. nutrition, smoking cessation, social work)? No      SHIPPING     Specialty Medication(s) to be Shipped:   General Specialty: Repatha    Other medication(s) to be shipped: No additional medications requested for fill at this [facility]     Changes to insurance: No    Delivery Scheduled: Yes, Expected medication delivery date: 06/04/20.     Medication will be delivered via Same Day Courier to the confirmed prescription address in [facility].    The patient will receive a drug information handout for each medication shipped and additional FDA Medication Guides as required.  Verified that patient has previously received a Conservation officer, historic buildings.    All of the patient's questions and concerns have been addressed.    [name]   [facility] Specialty Pharmacist

## 2020-06-04 MED FILL — REPATHA SURECLICK 140 MG/ML SUBCUTANEOUS PEN INJECTOR: 28 days supply | Qty: 2 | Fill #1 | Status: AC

## 2020-06-04 MED FILL — REPATHA SURECLICK 140 MG/ML SUBCUTANEOUS PEN INJECTOR: SUBCUTANEOUS | 28 days supply | Qty: 2 | Fill #1

## 2020-06-12 ENCOUNTER — Ambulatory Visit
Admission: RE | Admit: 2020-06-12 | Discharge: 2020-06-12 | Disposition: A | Discharge status: Home / Self Care | Payer: PPO | Source: Ambulatory Visit | Attending: Nurse Practitioner | Admitting: Nurse Practitioner

## 2020-06-12 ENCOUNTER — Other Ambulatory Visit: Payer: Self-pay

## 2020-06-12 DIAGNOSIS — Z87891 Personal history of nicotine dependence: Secondary | ICD-10-CM | POA: Diagnosis not present

## 2020-06-12 DIAGNOSIS — Z122 Encounter for screening for malignant neoplasm of respiratory organs: Secondary | ICD-10-CM | POA: Insufficient documentation

## 2020-06-12 DIAGNOSIS — J432 Centrilobular emphysema: Secondary | ICD-10-CM | POA: Diagnosis not present

## 2020-06-13 ENCOUNTER — Telehealth: Payer: Self-pay | Admitting: *Deleted

## 2020-06-13 NOTE — Telephone Encounter (Signed)
Notified patient of LDCT lung cancer screening program results with recommendation for 6 month follow up imaging. Also notified of incidental findings noted below and is encouraged to discuss further with PCP who will receive a copy of this note and/or the CT report. Patient verbalizes understanding.   IMPRESSION: 1. Lung-RADS 3, probably benign findings. Short-term follow-up in 6 months is recommended with repeat low-dose chest CT without contrast (please use the following order, "CT CHEST LCS NODULE FOLLOW-UP W/O CM"). New left upper lobe pleural-based density of volume derived equivalent diameter 5.9 mm. 2. Aortic Atherosclerosis (ICD10-I70.0) and Emphysema (ICD10-J43.9). Coronary artery atherosclerosis. 3. Bilateral adrenal adenomas.

## 2020-06-17 ENCOUNTER — Ambulatory Visit: Admit: 2020-06-17 | Discharge: 2020-06-18 | Payer: PRIVATE HEALTH INSURANCE

## 2020-06-17 DIAGNOSIS — I2510 Atherosclerotic heart disease of native coronary artery without angina pectoris: Secondary | ICD-10-CM | POA: Diagnosis not present

## 2020-06-17 LAB — LIPID PANEL
HDL CHOLESTEROL: 46 mg/dL (ref 40–60)
LDL CHOLESTEROL CALCULATED: 45 mg/dL (ref 40–99)
TRIGLYCERIDES: 218 mg/dL — ABNORMAL HIGH (ref 0–150)
VLDL CHOLESTEROL CAL: 43.6 mg/dL — ABNORMAL HIGH (ref 11–41)

## 2020-06-17 LAB — NON-HDL CHOLESTEROL: Cholesterol.non HDL:MCnc:Pt:Ser/Plas:Qn:: 89

## 2020-06-17 LAB — LDL CHOLESTEROL DIRECT: Cholesterol.in LDL:MCnc:Pt:Ser/Plas:Qn:Direct assay: 75.6

## 2020-06-18 DIAGNOSIS — R350 Frequency of micturition: Secondary | ICD-10-CM | POA: Diagnosis not present

## 2020-06-18 DIAGNOSIS — R300 Dysuria: Secondary | ICD-10-CM | POA: Diagnosis not present

## 2020-06-18 NOTE — Unmapped (Signed)
I received a voice message from patient requesting a refill on her klonopin from Dr. Nadara Mode. She vocalized that she will run out by 10/14.

## 2020-06-20 MED ORDER — CLONAZEPAM 0.5 MG TABLET
ORAL_TABLET | Freq: Two times a day (BID) | ORAL | 0 refills | 90.00000 days | Status: CP | PRN
Start: 2020-06-20 — End: 2020-09-18

## 2020-06-20 NOTE — Unmapped (Signed)
Addended by: Morene Rankins on: 06/20/2020 10:42 AM     Modules accepted: Orders

## 2020-06-25 ENCOUNTER — Institutional Professional Consult (permissible substitution)
Admit: 2020-06-25 | Discharge: 2020-06-26 | Payer: PRIVATE HEALTH INSURANCE | Attending: Pharmacist | Primary: Pharmacist

## 2020-06-25 DIAGNOSIS — E782 Mixed hyperlipidemia: Principal | ICD-10-CM

## 2020-06-25 NOTE — Unmapped (Signed)
[facility] Pharm[facility] Refill Coordination Note    Specialty Medication(s) to be Shipped:   General Specialty: Repatha    Other medication(s) to be shipped: No additional medications requested for fill at this time     Amber Duncan, DOB: 05/30/1951  Phone: [phone] (home)       All above HIPAA information was verified with patient.     Was a Nurse, learning disability used for this call? No    Completed refill call assessment today to schedule patient's medication shipment from the [facility] [phone]).       Specialty medication(s) and dose(s) confirmed: Regimen is correct and unchanged.   Changes to medications: Amber Duncan reports no changes at this time.  Changes to insurance: No  Questions for the pharmacist: No    Confirmed patient received Welcome Packet with first shipment. The patient will receive a drug information handout for each medication shipped and additional FDA Medication Guides as required.       DISEASE/MEDICATION-SPECIFIC INFORMATION        For patients on injectable medications: Patient currently has 0 doses left.  Next injection is scheduled for 07/03/20.    SPECIALTY MEDICATION ADHERENCE     Medication Adherence    Patient reported X missed doses in the last month: 0  Specialty Medication: Repatha 140mg /ml  Patient is on additional specialty medications: No  Informant: patient                  SHIPPING     Shipping address confirmed in Epic.     Delivery Scheduled: Yes, Expected medication delivery date: 07/02/20.     Medication will be delivered via Same Day Courier to the prescription address in Epic [facility].    Amber [facility]   [facility] Specialty Technician

## 2020-06-25 NOTE — Unmapped (Addendum)
[facility]  [address]  [place], [place] [address]  PHONE: [phone]  FAX: [phone]           CLINICAL PHARMACIST PRACTITIONER NOTE   LIPID THERAPY    Referring Provider: Ramond Marrow, MD   Cardiologist: Ramond Marrow, MD   Primary Care Provider: Gracelyn Nurse, MD    Assessment and Plan:     #ASCVD Risk Reduction Initially underwent PCI to RCA in 2008. Subsequently experienced exertional CP in Fall 2020. She underwent a myocardial perfusion scan followed by a left heart catheterization. She underwent placement of DES x1 to RCA on 07/31/19. She has a history of intolerance to multiple statins (myalgias), as well as marked hyperglycemia with rosuvastatin 5 mg daily. She denies any children or siblings have elevated cholesterol levels. Today she is doing well and is without complaint. She continues to tolerate Repatha and endorses administering 4 doses to date. She reports administrating in alternating legs each time. Repeat lipid panel on 06/17/20 shows improved direct LDL at 76.5 mg/dL (down from calculated LDL 184 mg/dL previously). We congratulated her on her progress with this LDL decrease, and discussed today other possible medications to aid in lowering her LDL further to obtain goal LDL < 70 mg/dL. We discussed trailing a very low dose statin, pravastatin 5 mg twice daily which she was amenable to. We also discussed considering an SGLT2i in the future for further CV risk reduction. Amber Duncan was familiar with this class of medications, and states she would be interested in pursuing. Of note, she does report 5-6 UTI's this year, with the most recent being 1 week prior. We discussed that UTIs  this are a possible side effect of SGLT2i's and while that does not preclude [place] from starting, we would closely monitor for recurrent UTI's if initiated. We deferred initiation today given resolving UTI and will re-address this at next visit. Amber Duncan verbalized understanding of the above plan and was given an opportunity to ask questions, all of which were answered.   ?? Start pravastatin 5 mg twice daily.   ?? Note, history of statin intolerance: she developed myalgias to multiple statins in the past, and became markedly hyperglycemic on rosuvastatin  ?? Continue Repatha 140 mg subcutaneous every 2 weeks.   ?? Key risk factors include: CAD, PCI, Hypertension, hemoglobin A1c 6.1%, BMI (30 kg/m2), Lipids (LDL 184 mg/dL, triglycerides 130 mg/dL) and former smoker, breast cancer  ?? Continue aspirin 81 mg daily   ?? Consider initiation of SGLT2i at a future visit for further CV risk reduction. Would monitor closely for recurrent UTI's when initiating given patient's history with low threshold to discontinue.   ?? Jardiance preferred by insurance, 10 mg once daily estimated copay of $15/month   ?? Repeat lipid panel and direct LDL in 2-3 months as appropriate (~ December 2021)     #HTN Amber Duncan reports stable BP's at home, between 100-110's/60-70 mmHg. She checks her BP a few times weekly. She denies any dizziness or lightheadedness. She mentioned periodic HR elevations to 120 bpm, however denies feeling any palpations or symptoms. We discussed how this may be due to exertion prior to checking her HR or related to her known thyroid disorder. Given BP at goal of < 130/80 mm Hg will not make any medication changes today.  ?? Continue lisinopril 20 mg daily (increased from 10 mg daily on 04/09/20)      #Type 2 Diabetes Mellitus Amber Duncan reports well controlled BG at  home, with morning values consistently 110's. She denies any symptoms of hyper- or hypoglycemia. Last A1C 6.3% (August 2021, [facility]). Today we discussed SGLT2i initiation as mentioned above.  Amber Duncan inquired about the possibility of stopping metformin if SGLT2 inhibitor initiated.  We discussed re-evalating any changes to her regimen at next visit.    ?? Continue metformin 1000 mg twice daily  ?? Consider initiation of SGLT2i at a future visit for further CV risk reduction. Would monitor closely for recurrent UTI's when initiating given patient's history with low threshold to discontinue.     #Medication Adherence/Monitoring:   ?? Reviewed the indication, dose, and frequency of each medication with patient   ?? Medications reviewed in EPIC medication station and updated today by the clinical pharmacist practitioner.    #FOLLOW UP:   ?? lab check (lipid panel) sometime in December   ?? repeat pharmacist phone visit on 07/09/20 at 1:00 PM   ?? Amber Duncan believes she should have a follow up visit with Dr. Barbette Merino in the upcoming months, however nothing is currently scheduled. She will call the clinic to schedule her next cardiologist appointment.     I spent a total of 25 minutes on the phone with the patient delivering clinical care and providing education/counseling.      Madie Reno, PharmD  PGY2 Cardiology Pharmacy Resident    Megan Elige Ko, PharmD, BCPS, [facility], CPP  Cardiology Clinical Pharmacist Practitioner  [facility]  [address]  [place], [place] [address]  Phone: [phone]  Fax: [phone]      History of Present Illness:   Amber Duncan is a 69 y.o. year old female with a history of CAD s/p PCI, HTN, HLD who presents today for consideration of PCSK9 inhibitor therapy at the request of Dr. Barbette Merino. Amber Duncan was diagnosed with CAD in 2008 and underwent PCI at that time. She had no further cardiac issues until Fall 2020 when she developed exertional chest tightness that radiated down her left arm. She underwent a myocardial perfusion scan followed by a left heart catheterization, during which she underwent PCI with DES x1 to the RCA (07/31/19). Thereafter her exertional symptoms resolved and have not recurred. Amber Duncan has a history of statin intolerance, she has developed myalgias to multiple statins in the past, and became markedly hyperglycemic on rosuvastatin.     Since being seen in cardiology clinic on 04/09/20, Amber Duncan reported to [facility] ED with near syncope on 04/13/20. She improved with IV hydration and was not orthostatic. BP at that time was 139/87 mm Hg per [facility]. She was discharged from the ED.    Amber Duncan reports doing well although she does complain of extreme fatigue which is chronic and stable. She completed radiation last month for breast cancer; no chemotherapy is currently planned. She endorses receiving IV iron infusions for her chronic anemia, reporting her last infusion was early September.     Summary of Recent Visits and Key Medication Changes  ??? 02/06/20: Start rosuvastatin 5 mg tablet once daily   ?? 04/09/20: Stop rosuvastatin due to siginificant hyperglycemia  ?? 04/15/20: Start Rapatha 140 mg subcutaneous every 2 weeks (required manufacturer's assistance - first dose will be 05/08/20)   ?? 05/08/20: First Repatha dose confirmed.     History of myalgias with several statins and marked hyperglycemia with rosuvastatin 5 mg daily (BGs increased from ~120-135 to 250-280 mg/dL).     Interval History  She reports no dyspnea, orthopnea, paroxysmal nocturnal dyspnea,  or LE edema. No palpitations, chest pain, dizziness, pre-syncope or syncope. She reports a good appetite without bloating, abd distension or early satiety.     Cardiovascular History & Procedures:  Cath / PCI:  07/31/19  ?? Mid Cx to Dist Cx lesion is 50% stenosed.   ?? Prox RCA lesion is 40% stenosed.   ?? Dist RCA lesion is 90% stenosed.   ?? Previously placed Mid RCA to Dist RCA stent (unknown type) is widely  patent.   ?? A drug-eluting stent was successfully placed using a STENT RESOLUTE ONYX  [id number].   ?? Post intervention, there is a 0% residual stenosis.     1. ??One-vessel CAD with patent stent mid to distal RCA with 90% stenosis   in distal native vessel just beyond the stent   2. ??Normal left ventricular function   3. ??Successful PCI with DES distal RCA  ??  PCI to RCA 2008  ??  CV Surgery:  None    EP Procedures and Devices:  None  ??  Non-Invasive Evaluation(s):  Echo:  07/20/19  NORMAL LEFT VENTRICULAR SYSTOLIC FUNCTION ?? WITH MILD LVH   NORMAL RIGHT VENTRICULAR SYSTOLIC FUNCTION   MILD VALVULAR REGURGITATION (See above)   NO VALVULAR STENOSIS   TRIVIAL MR, TR   MILD PR   EF 50-55%   ??  CT/MRI/Nuclear Tests:  Myocardial perfusion scan 07/20/19  1. ??Negative ETT with nonspecific ST abnormalities   2. ??Normal left ventricular function   3. ??Normal wall motion   4. ??Minimal to mild apical wall ischemia      Past Medical History:   Diagnosis Date   ??? Breast cancer (CMS-HCC)    ??? COPD (chronic obstructive pulmonary disease) (CMS-HCC)    ??? Coronary artery disease    ??? Diabetes (CMS-HCC)    ??? Disease of thyroid gland    ??? Fatigue    ??? Hepatitis    ??? Hepatitis    ??? Hyperlipidemia    ??? Hypertension    ??? Migraine    ??? Sleep apnea         Past Surgical History:   Procedure Laterality Date   ??? BACK SURGERY     ??? BREAST BIOPSY     ??? CATARACT EXTRACTION     ??? CHOLECYSTECTOMY     ??? CORONARY STENT PLACEMENT     ??? HYSTERECTOMY     ??? KNEE SURGERY     ??? PR COLONOSCOPY,ABLATE LESION N/A 01/21/2017    Procedure: COLONOSCOPY FLEX; W/ABLAT LES NOT AMENABLE-SNARE;  Surgeon: Luanne Bras, MD;  Location: [facility] PROC[facility];  Service: Gastroenterology   ??? PR COLSC FLX W/RMVL OF TUMOR POLYP LESION SNARE TQ N/A 01/21/2017    Procedure: COLONOSCOPY FLEX; W/REMOV TUMOR/LES BY SNARE;  Surgeon: Luanne Bras, MD;  Location: [facility];  Service: Gastroenterology   ??? PR MASTECTOMY, PARTIAL Left 01/19/2020    Procedure: MASTECTOMY, PARTIAL (EG, LUMPECTOMY, TYLECTOMY, QUADRANTECTOMY, SEGMENTECTOMY);  Surgeon: Talbert Cage, DO;  Location: MAIN OR [facility];  Service: Surgical Oncology Breast   ??? PR UPPER GI ENDOSCOPY,BIOPSY N/A 04/29/2018    Procedure: UGI ENDOSCOPY; WITH BIOPSY, SINGLE OR MULTIPLE;  Surgeon: Rona Ravens, MD;  [facility];  Service: Gastroenterology   ??? REDUCTION MAMMAPLASTY          Social & Family History  She  reports that she quit smoking about 8 years ago. Her smoking use included cigarettes. She has a 96.00 pack-year  smoking history. She has never used smokeless tobacco. She reports that she does not drink alcohol and does not use drugs.     Family History   Problem Relation Age of Onset   ??? Cancer Brother    ??? Cancer Maternal Aunt    ??? Cancer Maternal Uncle    ??? Diabetes Paternal Uncle    ??? Heart failure Neg Hx         Medications:  Reviewed and updated. Medication list includes revisions made during today's encounter.    Current Outpatient Medications   Medication Sig Dispense Refill   ??? aspirin-calcium carbonate 81 mg-300 mg calcium(777 mg) Tab Take 81 mg by mouth.     ??? clonazePAM (KLONOPIN) 0.5 MG tablet Take 1 tablet (0.5 mg total) by mouth two (2) times a day as needed for anxiety. 180 tablet 0   ??? docusate sodium (COLACE) 100 MG capsule Take 100 mg by mouth daily.     ??? empty container (SHARPS-A-GATOR DISPOSAL SYSTEM) Misc Use as directed for sharps disposal 1 each 2   ??? evolocumab (REPATHA SURECLICK) 140 mg/mL PnIj Inject the contents of 1 pen (140 mg) under the skin every fourteen (14) days. 2 mL 3   ??? ferrous gluconate 324 MG tablet Take 324 mg by mouth once.     ??? levETIRAcetam (KEPPRA) 500 MG tablet Take 1 tablet (500 mg total) by mouth Two (2) times a day. 180 tablet 1   ??? levothyroxine (SYNTHROID) 100 MCG tablet Take 100 mcg by mouth.     ??? lisinopriL (PRINIVIL,ZESTRIL) 20 MG tablet Take 1 tablet (20 mg total) by mouth daily. 90 tablet 3   ??? metFORMIN (GLUCOPHAGE) 500 MG tablet Take 1,000 mg by mouth 2 (two) times a day with meals.      ??? SUMAtriptan (IMITREX) 100 MG tablet Take one tab at onset of headache, may repeat in 2 hours if headache persists of recurs 9 tablet 5     No current facility-administered medications for this visit.       Allergies/Adverse Events:  Patient has no known allergies.    Lab Results   Component Value Date    Creatinine Whole Blood, POC 0.8 07/10/2016    Creatinine 0.99 (H) 04/23/2020    Creatinine 1.00 (H) 04/09/2020    BUN 20 04/23/2020    BUN 22 04/09/2020    Potassium 5.1 (H) 04/23/2020    Potassium 4.7 (H) 04/09/2020    AST 16 04/09/2020    AST 30 05/26/2018    ALT 7 (L) 04/09/2020    ALT 29 05/26/2018    TSH 0.704 04/09/2020    Total Bilirubin 0.2 (L) 04/09/2020    Total Bilirubin 0.4 05/26/2018    INR 1.03 05/26/2018    INR 1.18 01/20/2017    WBC 4.8 05/26/2018    HGB 13.2 (L) 05/26/2018    HCT 40.1 05/26/2018    Platelet 248 05/26/2018    Triglycerides 218 (H) 06/17/2020    HDL 46 06/17/2020    Non-HDL Cholesterol 89 06/17/2020    LDL Calculated 45 06/17/2020    LDL Direct 75.6 06/17/2020         Vital Signs:  There were no vitals filed for this visit.    BP Readings from Last 3 Encounters:   04/09/20 143/79   02/22/20 128/71   02/21/20 116/61     Pulse Readings from Last 3 Encounters:   04/09/20 88   02/22/20 93   02/21/20 85  Wt Readings from Last 3 Encounters:   04/09/20 85.3 kg (188 lb)   02/22/20 84.3 kg (185 lb 12.8 oz)   02/12/20 84.5 kg (186 lb 3.2 oz)      BMI Readings from Last 3 Encounters:   04/09/20 30.34 kg/m??   02/22/20 30.01 kg/m??   02/12/20 30.07 kg/m??

## 2020-06-27 MED ORDER — PRAVASTATIN 10 MG TABLET
ORAL_TABLET | ORAL | 3 refills | 28.00000 days | Status: CP
Start: 2020-06-27 — End: ?

## 2020-07-02 DIAGNOSIS — I2510 Atherosclerotic heart disease of native coronary artery without angina pectoris: Principal | ICD-10-CM

## 2020-07-02 MED FILL — REPATHA SURECLICK 140 MG/ML SUBCUTANEOUS PEN INJECTOR: SUBCUTANEOUS | 28 days supply | Qty: 2 | Fill #2

## 2020-07-02 MED FILL — REPATHA SURECLICK 140 MG/ML SUBCUTANEOUS PEN INJECTOR: 28 days supply | Qty: 2 | Fill #2 | Status: AC

## 2020-07-09 ENCOUNTER — Institutional Professional Consult (permissible substitution)
Admit: 2020-07-09 | Discharge: 2020-07-10 | Payer: PRIVATE HEALTH INSURANCE | Attending: Pharmacotherapy | Primary: Pharmacotherapy

## 2020-07-09 DIAGNOSIS — E782 Mixed hyperlipidemia: Principal | ICD-10-CM

## 2020-07-09 MED ORDER — PRAVASTATIN 10 MG TABLET
ORAL_TABLET | Freq: Every day | ORAL | 3 refills | 30 days | Status: CP
Start: 2020-07-09 — End: ?

## 2020-07-09 NOTE — Unmapped (Addendum)
[facility]  [address]  [place], [place] [address]  PHONE: [phone]  FAX: [phone]           CLINICAL PHARMACIST PRACTITIONER NOTE   LIPID THERAPY    Referring Provider: Ramond Marrow, MD   Cardiologist: Ramond Marrow, MD   Primary Care Provider: Gracelyn Nurse, MD    Assessment and Plan:     #ASCVD Risk Reduction Initially underwent PCI to RCA in 2008. Subsequently experienced exertional CP in Fall 2020. She underwent a myocardial perfusion scan followed by a left heart catheterization. She underwent placement of DES x1 to RCA on 07/31/19. She has a history of intolerance to multiple statins (myalgias), as well as marked hyperglycemia with rosuvastatin 5 mg daily. She denies any children or siblings have elevated cholesterol levels. Today she is doing well and is without complaint. She continues to tolerate Repatha. She reports administrating in alternating legs each time. Repeat lipid panel last collected on 10/11/2 showed an improved direct LDL at 76.5 mg/dL (down from calculated LDL 184 mg/dL previously). At our last visit we initiated pravastatin 5 mg twice weekly to aid in lowering her LDL further to obtain goal LDL < 70 mg/dL. She reports tolerating pravastatin well since our last visit. She denies any myalgias, and states her BG has rarely spiked after initiating this therapy, with one afternoon BG reported to be 170 mg/dL. I assured her that given this very low dose given twice weekly, I would not anticipate her BG to be significantly impacted by this new medication. Today we discussed increasing her dose to 5 mg daily, which she was amenable to.   Amber Duncan verbalized understanding of the above plan and was given an opportunity to ask questions, all of which were answered.   ?? Increase pravastatin to 5 mg daily.   ?? Note, history of statin intolerance: she developed myalgias to multiple statins in the past, and became markedly hyperglycemic on rosuvastatin  ?? Continue Repatha 140 mg subcutaneous every 2 weeks.   ?? Key risk factors include: CAD, PCI, Hypertension, hemoglobin A1c 6.1%, BMI (30 kg/m2), Lipids (LDL 184 mg/dL, triglycerides 578 mg/dL) and former smoker, breast cancer  ?? Continue aspirin 81 mg daily   ?? Can consider initiation of SGLT2i at a future visit for further CV risk reduction. Would monitor closely for recurrent UTI's when initiating given patient's history with low threshold to discontinue.   ?? Jardiance preferred by insurance, estimated copay $15/month   ?? Repeat lipid panel and direct LDL in 4-12 weeks as appropriate (~December 2021)     #HTN   Amber Duncan reports stable BP's at home, between 90-110's/60-70 mmHg. She states she has experiences some fatigue and disoreintation over the last week, but denies lightheadedness or dizziness. She endorses receiving IV iron infusions in the past for anemia, and is concerned her symptoms are consistent with anemia. She has an oncologist appointment next week to address this. We discussed not making any changes to her anti-hypertensive regimen today.  ?? Continue lisinopril 20 mg daily (increased from 10 mg daily on 04/09/20)      #Type 2 Diabetes Mellitus   Amber Duncan reports well controlled BG at home, with morning values consistently in the low 100's, with periodic afternoon values around 170, however reports she does not check her afternoon values routinely. She denies any symptoms of hyper- or hypoglycemia. Last HbA1c 6.3% (August 2021, [facility]). We had previously discussed the option of initiating an SGLT2 inhibitor, however today  discussed making no mediation changes.    ?? Continue metformin 1000 mg twice daily  ?? Consider initiation of SGLT2i at a future visit for further CV risk reduction. Would monitor closely for recurrent UTI's when initiating given patient's history with low threshold to discontinue.   ?? Alternatively, may consider GLP-1 RA (such as liraglutide) for ASCVD risk reduction     #Medication Adherence/Monitoring:   ?? Reviewed the indication, dose, and frequency of each medication with patient   ?? Medications reviewed in EPIC medication station and updated today by the clinical pharmacist practitioner.    #FOLLOW UP:   ?? lab check (lipid panel) around 08/05/20 when patient is at [facility] for another appointment    ?? repeat pharmacist phone visit on 08/09/20 at 11:30am   ?? Return cardiologist appointment on 10/15/20 as scheduled    I spent a total of 25 minutes on the phone with the patient delivering clinical care and providing education/counseling.        Madie Reno, PharmD  PGY2 Cardiology Pharmacy Resident      Donnamae Jude, PharmD, BCPS, [facility], CPP  Cardiology Clinical Pharmacist Practitioner  [facility]  [address]  [place], [place] [address]  Phone: [phone]  Fax: [phone]        History of Present Illness:   Amber Duncan is a 69 y.o. year old female with a history of CAD s/p PCI, HTN, HLD who presents today for consideration of PCSK9 inhibitor therapy and lipid therapy optimization at the request of Dr. Barbette Merino. [name] was diagnosed with CAD in 2008 and underwent PCI at that time. She had no further cardiac issues until Fall 2020 when she developed exertional chest tightness that radiated down her left arm. She underwent a myocardial perfusion scan followed by a left heart catheterization, during which she underwent PCI with DES x1 to the RCA (07/31/19). Thereafter her exertional symptoms resolved and have not recurred. Amber Duncan has a history of statin intolerance, she has developed myalgias to multiple statins in the past, and became markedly hyperglycemic on rosuvastatin.     Since being seen in cardiology clinic on 04/09/20, Amber Duncan reported to [facility] ED with near syncope on 04/13/20. She improved with IV hydration and was not orthostatic. BP at that time was 139/87 mm Hg per [facility]. She was discharged from the ED.    Amber Duncan reports doing well although she does complain of extreme fatigue which is chronic and stable. She completed radiation last month for breast cancer; no chemotherapy is currently planned. She endorses receiving IV iron infusions for her chronic anemia, reporting her last infusion was early September.     Summary of Recent Visits and Key Medication Changes  ??? 02/06/20: Start rosuvastatin 5 mg tablet once daily   ?? 04/09/20: Stop rosuvastatin due to siginificant hyperglycemia  ?? 04/15/20: Start Rapatha 140 mg subcutaneous every 2 weeks (required manufacturer's assistance)   ?? 05/08/20: First Repatha dose confirmed.   ?? 06/25/20: Pravastatin 5 mg twice weekly started.    History of myalgias with several statins and marked hyperglycemia with rosuvastatin 5 mg daily (BGs increased from ~120-135 to 250-280 mg/dL).     Interval History  She reports no dyspnea, orthopnea, paroxysmal nocturnal dyspnea, or LE edema. No palpitations, chest pain, dizziness, pre-syncope or syncope. She reports a good appetite without bloating, abd distension or early satiety.       Cardiovascular History & Procedures:  Cath / PCI:  07/31/19  ??  Mid Cx to Dist Cx lesion is 50% stenosed.   ?? Prox RCA lesion is 40% stenosed.   ?? Dist RCA lesion is 90% stenosed.   ?? Previously placed Mid RCA to Dist RCA stent (unknown type) is widely  patent.   ?? A drug-eluting stent was successfully placed using a STENT RESOLUTE ONYX  [id number].   ?? Post intervention, there is a 0% residual stenosis.     1. ??One-vessel CAD with patent stent mid to distal RCA with 90% stenosis   in distal native vessel just beyond the stent   2. ??Normal left ventricular function   3. ??Successful PCI with DES distal RCA  ??  PCI to RCA 2008  ??  CV Surgery:  None    EP Procedures and Devices:  None  ??  Non-Invasive Evaluation(s):  Echo:  07/20/19  NORMAL LEFT VENTRICULAR SYSTOLIC FUNCTION ?? WITH MILD LVH   NORMAL RIGHT VENTRICULAR SYSTOLIC FUNCTION   MILD VALVULAR REGURGITATION (See above)   NO VALVULAR STENOSIS   TRIVIAL MR, TR   MILD PR   EF 50-55%   ??  CT/MRI/Nuclear Tests:  Myocardial perfusion scan 07/20/19  1. ??Negative ETT with nonspecific ST abnormalities   2. ??Normal left ventricular function   3. ??Normal wall motion   4. ??Minimal to mild apical wall ischemia      Past Medical History:   Diagnosis Date   ??? Breast cancer (CMS-HCC)    ??? COPD (chronic obstructive pulmonary disease) (CMS-HCC)    ??? Coronary artery disease    ??? Diabetes (CMS-HCC)    ??? Disease of thyroid gland    ??? Fatigue    ??? Hepatitis    ??? Hepatitis    ??? Hyperlipidemia    ??? Hypertension    ??? Migraine    ??? Sleep apnea         Past Surgical History:   Procedure Laterality Date   ??? BACK SURGERY     ??? BREAST BIOPSY     ??? CATARACT EXTRACTION     ??? CHOLECYSTECTOMY     ??? CORONARY STENT PLACEMENT     ??? HYSTERECTOMY     ??? KNEE SURGERY     ??? PR COLONOSCOPY,ABLATE LESION N/A 01/21/2017    Procedure: COLONOSCOPY FLEX; W/ABLAT LES NOT AMENABLE-SNARE;  Surgeon: Luanne Bras, MD;  Location: [facility] PROC[facility];  Service: Gastroenterology   ??? PR COLSC FLX W/RMVL OF TUMOR POLYP LESION SNARE TQ N/A 01/21/2017    Procedure: COLONOSCOPY FLEX; W/REMOV TUMOR/LES BY SNARE;  Surgeon: Luanne Bras, MD;  Location: [facility];  Service: Gastroenterology   ??? PR MASTECTOMY, PARTIAL Left 01/19/2020    Procedure: MASTECTOMY, PARTIAL (EG, LUMPECTOMY, TYLECTOMY, QUADRANTECTOMY, SEGMENTECTOMY);  Surgeon: Talbert Cage, DO;  Location: MAIN OR [facility];  Service: Surgical Oncology Breast   ??? PR UPPER GI ENDOSCOPY,BIOPSY N/A 04/29/2018    Procedure: UGI ENDOSCOPY; WITH BIOPSY, SINGLE OR MULTIPLE;  Surgeon: Rona Ravens, MD;  [facility];  Service: Gastroenterology   ??? REDUCTION MAMMAPLASTY          Social & Family History  She  reports that she quit smoking about 8 years ago. Her smoking use included cigarettes. She has a 96.00 pack-year smoking history. She has never used smokeless tobacco. She reports that she does not drink alcohol and does not use drugs.     Family History   Problem Relation Age of Onset   ??? Cancer Brother    ???  Cancer Maternal Aunt    ??? Cancer Maternal Uncle    ??? Diabetes Paternal Uncle    ??? Heart failure Neg Hx         Medications:  Reviewed and updated. Medication list includes revisions made during today's encounter.    Current Outpatient Medications   Medication Sig Dispense Refill   ??? aspirin-calcium carbonate 81 mg-300 mg calcium(777 mg) Tab Take 81 mg by mouth.     ??? clonazePAM (KLONOPIN) 0.5 MG tablet Take 1 tablet (0.5 mg total) by mouth two (2) times a day as needed for anxiety. 180 tablet 0   ??? docusate sodium (COLACE) 100 MG capsule Take 100 mg by mouth daily.     ??? empty container (SHARPS-A-GATOR DISPOSAL SYSTEM) Misc Use as directed for sharps disposal 1 each 2   ??? ferrous gluconate 324 MG tablet Take 324 mg by mouth once.     ??? levETIRAcetam (KEPPRA) 500 MG tablet Take 1 tablet (500 mg total) by mouth Two (2) times a day. 180 tablet 1   ??? levothyroxine (SYNTHROID) 100 MCG tablet Take 100 mcg by mouth.     ??? lisinopriL (PRINIVIL,ZESTRIL) 20 MG tablet Take 1 tablet (20 mg total) by mouth daily. 90 tablet 3   ??? metFORMIN (GLUCOPHAGE) 500 MG tablet Take 1,000 mg by mouth 2 (two) times a day with meals.      ??? pravastatin (PRAVACHOL) 10 MG tablet Take 0.5 tablets (5 mg total) by mouth daily. 15 tablet 3   ??? SUMAtriptan (IMITREX) 100 MG tablet Take one tab at onset of headache, may repeat in 2 hours if headache persists of recurs 9 tablet 5   ??? evolocumab (REPATHA SURECLICK) 140 mg/mL PnIj Inject the contents of 1 pen (140 mg) under the skin every fourteen (14) days. 2 mL 3     No current facility-administered medications for this visit.       Allergies/Adverse Events:  Patient has no known allergies.    Lab Results   Component Value Date    Creatinine Whole Blood, POC 0.8 07/10/2016    Creatinine 0.99 (H) 04/23/2020    Creatinine 1.00 (H) 04/09/2020    BUN 20 04/23/2020 BUN 22 04/09/2020    Potassium 5.1 (H) 04/23/2020    Potassium 4.7 (H) 04/09/2020    AST 16 04/09/2020    AST 30 05/26/2018    ALT 7 (L) 04/09/2020    ALT 29 05/26/2018    TSH 0.704 04/09/2020    Total Bilirubin 0.2 (L) 04/09/2020    Total Bilirubin 0.4 05/26/2018    INR 1.03 05/26/2018    INR 1.18 01/20/2017    WBC 4.8 05/26/2018    HGB 13.2 (L) 05/26/2018    HCT 40.1 05/26/2018    Platelet 248 05/26/2018    Triglycerides 218 (H) 06/17/2020    HDL 46 06/17/2020    Non-HDL Cholesterol 89 06/17/2020    LDL Calculated 45 06/17/2020    LDL Direct 75.6 06/17/2020         Vital Signs:  There were no vitals filed for this visit.    BP Readings from Last 3 Encounters:   04/09/20 143/79   02/22/20 128/71   02/21/20 116/61     Pulse Readings from Last 3 Encounters:   04/09/20 88   02/22/20 93   02/21/20 85     Wt Readings from Last 3 Encounters:   04/09/20 85.3 kg (188 lb)   02/22/20 84.3 kg (185 lb 12.8 oz)  02/12/20 84.5 kg (186 lb 3.2 oz)      BMI Readings from Last 3 Encounters:   04/09/20 30.34 kg/m??   02/22/20 30.01 kg/m??   02/12/20 30.07 kg/m??

## 2020-07-09 NOTE — Unmapped (Addendum)
It was good speaking with you today.    As discussed:  ??? Increase pravastatin to 5 mg (one-half tablet) once daily.   ??? Continue other medications  ??? Make sure medication list matches medications at home  ??? Monitor and log weights, blood pressure and heart rate  ??? Avoid NSAIDs (such as ibuprofen or naproxen)  ??? [facility]   ??? Next phone visit Friday 08/09/20 at 11:30 AM.   ??? Please let us know if you have any issues    Call the clinic at [phone] with questions.    Our clinic fax number is [phone].    If you need to reschedule future appointments, please call [phone] or [phone]    After office hours, if you have urgent questions/problems, contact the on-call cardiologist through the hospital operator: [phone].

## 2020-07-12 ENCOUNTER — Other Ambulatory Visit: Payer: Self-pay

## 2020-07-12 DIAGNOSIS — D500 Iron deficiency anemia secondary to blood loss (chronic): Secondary | ICD-10-CM

## 2020-07-15 ENCOUNTER — Inpatient Hospital Stay: Payer: PPO | Attending: Oncology

## 2020-07-15 DIAGNOSIS — I129 Hypertensive chronic kidney disease with stage 1 through stage 4 chronic kidney disease, or unspecified chronic kidney disease: Secondary | ICD-10-CM | POA: Diagnosis not present

## 2020-07-15 DIAGNOSIS — D631 Anemia in chronic kidney disease: Secondary | ICD-10-CM | POA: Diagnosis not present

## 2020-07-15 DIAGNOSIS — Z79899 Other long term (current) drug therapy: Secondary | ICD-10-CM | POA: Insufficient documentation

## 2020-07-15 DIAGNOSIS — R5383 Other fatigue: Secondary | ICD-10-CM | POA: Diagnosis not present

## 2020-07-15 DIAGNOSIS — E785 Hyperlipidemia, unspecified: Secondary | ICD-10-CM | POA: Diagnosis not present

## 2020-07-15 DIAGNOSIS — J449 Chronic obstructive pulmonary disease, unspecified: Secondary | ICD-10-CM | POA: Insufficient documentation

## 2020-07-15 DIAGNOSIS — G8929 Other chronic pain: Secondary | ICD-10-CM | POA: Diagnosis not present

## 2020-07-15 DIAGNOSIS — Z87440 Personal history of urinary (tract) infections: Secondary | ICD-10-CM | POA: Diagnosis not present

## 2020-07-15 DIAGNOSIS — Z7982 Long term (current) use of aspirin: Secondary | ICD-10-CM | POA: Insufficient documentation

## 2020-07-15 DIAGNOSIS — N1831 Chronic kidney disease, stage 3a: Secondary | ICD-10-CM | POA: Diagnosis not present

## 2020-07-15 DIAGNOSIS — Z170 Estrogen receptor positive status [ER+]: Secondary | ICD-10-CM | POA: Insufficient documentation

## 2020-07-15 DIAGNOSIS — Z801 Family history of malignant neoplasm of trachea, bronchus and lung: Secondary | ICD-10-CM | POA: Insufficient documentation

## 2020-07-15 DIAGNOSIS — Z7984 Long term (current) use of oral hypoglycemic drugs: Secondary | ICD-10-CM | POA: Diagnosis not present

## 2020-07-15 DIAGNOSIS — E039 Hypothyroidism, unspecified: Secondary | ICD-10-CM | POA: Insufficient documentation

## 2020-07-15 DIAGNOSIS — G4730 Sleep apnea, unspecified: Secondary | ICD-10-CM | POA: Insufficient documentation

## 2020-07-15 DIAGNOSIS — C50812 Malignant neoplasm of overlapping sites of left female breast: Secondary | ICD-10-CM | POA: Diagnosis not present

## 2020-07-15 DIAGNOSIS — Z803 Family history of malignant neoplasm of breast: Secondary | ICD-10-CM | POA: Insufficient documentation

## 2020-07-15 DIAGNOSIS — K219 Gastro-esophageal reflux disease without esophagitis: Secondary | ICD-10-CM | POA: Insufficient documentation

## 2020-07-15 DIAGNOSIS — Z87891 Personal history of nicotine dependence: Secondary | ICD-10-CM | POA: Diagnosis not present

## 2020-07-15 DIAGNOSIS — Q2730 Arteriovenous malformation, site unspecified: Secondary | ICD-10-CM | POA: Insufficient documentation

## 2020-07-15 DIAGNOSIS — M25559 Pain in unspecified hip: Secondary | ICD-10-CM | POA: Diagnosis not present

## 2020-07-15 DIAGNOSIS — I2510 Atherosclerotic heart disease of native coronary artery without angina pectoris: Secondary | ICD-10-CM | POA: Insufficient documentation

## 2020-07-15 DIAGNOSIS — E1122 Type 2 diabetes mellitus with diabetic chronic kidney disease: Secondary | ICD-10-CM | POA: Insufficient documentation

## 2020-07-15 DIAGNOSIS — D500 Iron deficiency anemia secondary to blood loss (chronic): Secondary | ICD-10-CM | POA: Diagnosis not present

## 2020-07-15 DIAGNOSIS — Z923 Personal history of irradiation: Secondary | ICD-10-CM | POA: Insufficient documentation

## 2020-07-15 LAB — CBC WITH DIFFERENTIAL/PLATELET
Abs Immature Granulocytes: 0.02 10*3/uL (ref 0.00–0.07)
Basophils Absolute: 0.1 10*3/uL (ref 0.0–0.1)
Basophils Relative: 1 %
Eosinophils Absolute: 0.3 10*3/uL (ref 0.0–0.5)
Eosinophils Relative: 4 %
HCT: 34.2 % — ABNORMAL LOW (ref 36.0–46.0)
Hemoglobin: 11.5 g/dL — ABNORMAL LOW (ref 12.0–15.0)
Immature Granulocytes: 0 %
Lymphocytes Relative: 36 %
Lymphs Abs: 2.1 10*3/uL (ref 0.7–4.0)
MCH: 32.4 pg (ref 26.0–34.0)
MCHC: 33.6 g/dL (ref 30.0–36.0)
MCV: 96.3 fL (ref 80.0–100.0)
Monocytes Absolute: 0.5 10*3/uL (ref 0.1–1.0)
Monocytes Relative: 8 %
Neutro Abs: 3 10*3/uL (ref 1.7–7.7)
Neutrophils Relative %: 51 %
Platelets: 230 10*3/uL (ref 150–400)
RBC: 3.55 MIL/uL — ABNORMAL LOW (ref 3.87–5.11)
RDW: 12.9 % (ref 11.5–15.5)
WBC: 5.8 10*3/uL (ref 4.0–10.5)
nRBC: 0 % (ref 0.0–0.2)

## 2020-07-15 LAB — IRON AND TIBC
Iron: 78 ug/dL (ref 28–170)
Saturation Ratios: 26 % (ref 10.4–31.8)
TIBC: 301 ug/dL (ref 250–450)
UIBC: 223 ug/dL

## 2020-07-15 LAB — FERRITIN: Ferritin: 57 ng/mL (ref 11–307)

## 2020-07-16 ENCOUNTER — Inpatient Hospital Stay: Payer: PPO

## 2020-07-16 ENCOUNTER — Inpatient Hospital Stay (HOSPITAL_BASED_OUTPATIENT_CLINIC_OR_DEPARTMENT_OTHER): Payer: PPO | Admitting: Oncology

## 2020-07-16 ENCOUNTER — Encounter: Payer: Self-pay | Admitting: Oncology

## 2020-07-16 ENCOUNTER — Other Ambulatory Visit: Payer: Self-pay

## 2020-07-16 VITALS — BP 135/84 | HR 91 | Temp 98.0°F | Resp 18 | Wt 189.4 lb

## 2020-07-16 DIAGNOSIS — M2550 Pain in unspecified joint: Secondary | ICD-10-CM | POA: Diagnosis not present

## 2020-07-16 DIAGNOSIS — N1831 Chronic kidney disease, stage 3a: Secondary | ICD-10-CM | POA: Diagnosis not present

## 2020-07-16 DIAGNOSIS — R5383 Other fatigue: Secondary | ICD-10-CM

## 2020-07-16 DIAGNOSIS — D500 Iron deficiency anemia secondary to blood loss (chronic): Secondary | ICD-10-CM | POA: Diagnosis not present

## 2020-07-16 NOTE — Progress Notes (Signed)
Patient reports no change in the chronic bilateral hip pain.  New left arm pain/soreness with an egg shaped knot.  The fatigue has not improved.

## 2020-07-16 NOTE — Progress Notes (Signed)
Hematology/Oncology  note [facility] ([place]) [phone] [phone]   Patient Care Team: Baxter Hire, MD as PCP - General (Internal Medicine) Lucilla Lame, MD as Consulting Physician (Gastroenterology) Anabel Bene, MD as Referring Physician (Neurology) Theodore Demark, RN as Oncology Nurse Navigator Earlie Server, MD as Consulting Physician (Oncology)  REFERRING PROVIDER:  CHIEF COMPLAINTS/REASON FOR VISIT:  Evaluation for iron deficiency anemia  HISTORY OF PRESENTING ILLNESS:  Leslie Olsen is a  69 y.o.  female with PMH listed below who was referred to me for evaluation of breast cancer  Patient had screening mammogram on 10/24/2019 and mammogram showed a possible asymmetry in the left which warrants further evaluation.  Diagnostic left breast and ultrasound was done 32/2021 and showed suspicious mass 7x4x56m with possible associated distortion in the left breast at 5o'clock.  No evidence of left axillary lymphadenopathy. Left breast mass was biopsied and pathology showed: Invasive mammary carcinoma no special type, DCIS present, intermediate grade.  ER 91-100%, PR 81-90%, HER2 negative  Nipple discharge: Denies Menarche 69years old Age of first live birth was [birth year]Hysterectomy at 69years old  Family history: Maternal aunt with breast cancer at age of 57  Brother has lung cancer OCP use: denies.  Estrogen and progesterone therapy: 2 years of hormone replacement in her late 453s   History of radiation to chest: denies.  She has a history of radioactive iodine ablation Previous breast surgery: History of breast augmentation History of left breast biopsy in 1970 She denies any nipple discharge. Patient was seen by Surgery Dr.Sakai. She is on dural antiplatelet therapy due to drug eluting stent that was placed in November 2020. Cardiology does not clear patient to be off antiplatelet therapy due to risk of thrombosis.  Patient does not  like the idea of neoadjuvant endocrine therapy.  [facility] She does not like neoadjuvant endocrine therapy and [facility]  01/19/2020 she underwent left breast partial mastectomy, pathology showed invasive adenocarcinoma, pT1b pNx , DCIS present, margin negative. ER+, PR + HER2 -. Patient underwent adjuvant radiation.  Patient was seen by Dr. RFuller Plan6/03/2020 and the decision was made not to proceed with adjuvant endocrine therapy. Per Dr.Reeder's note " AI would be the drug of choice in her case since she is on anti-coagulation, and I agree that some of the issues she already has such as joint pain could be worsened with AI, she is also concerned about the large number of pills she takes and possibility of bone loss (she was a longtime smoker before quitting). I support her preference for maximal reduction of LR recurrence risk, which is her most likely future event, with RT and deferring ET which would confer a real, but small survival benefit on the order of 1%. "     INTERVAL HISTORY Leslie WINKOWSKIis a 69y.o. female who has above history reviewed by me today referred by primary care provider to me for evaluation of anemia.  Problems and complaints are listed below:   During the interval, patient has had IV Venofer treatments.  Today she continues to feel very fatigued and tired.  She also complains about chronic hip pain and upper arm aches.  She feels that " something is wrong with me"."  I have had recurrent UTIs". Denies any black or bloody stool.  Review of Systems  Constitutional: Positive for fatigue. Negative for appetite change, chills  and fever.  HENT:   Negative for hearing loss and voice change.   Eyes: Negative for eye problems.  Respiratory: Negative for chest tightness, cough and shortness of breath.   Cardiovascular: Negative for chest pain.  Gastrointestinal: Negative for abdominal distention,  abdominal pain and blood in stool.  Endocrine: Negative for hot flashes.  Genitourinary: Negative for difficulty urinating and frequency.   Musculoskeletal: Positive for arthralgias.  Skin: Negative for itching and rash.  Neurological: Negative for extremity weakness.  Hematological: Negative for adenopathy.  Psychiatric/Behavioral: Negative for confusion.    MEDICAL HISTORY:  Past Medical History:  Diagnosis Date  . Anemia   . CAD (coronary artery disease)    stent in 2017, Nov 2020  . Cancer ([place]) 11/2019   Breast cancer   . Cephalalgia 04/08/2015  . Chronic headaches   . COPD (chronic obstructive pulmonary disease) ([place])   . Diabetes mellitus without complication ([place])   . Fatigue due to excessive exertion 08/22/2018  . GERD (gastroesophageal reflux disease)   . Hyperlipidemia   . Hypertension   . Hypothyroidism   . [facility] spotted fever) 07/27/2016  . Sleep apnea     SURGICAL HISTORY: Past Surgical History:  Procedure Laterality Date  . ABDOMINAL HYSTERECTOMY    . BACK SURGERY    . BREAST BIOPSY Left 11/15/2019   [place] bx , heart marker   , path pending  . BREAST EXCISIONAL BIOPSY Left 1970's  . CHOLECYSTECTOMY    . COLONOSCOPY N/A 11/05/2019   Procedure: COLONOSCOPY;  Surgeon: Toledo, Benay Pike, MD;  Location: [facility] ENDOSCOPY;  Service: Gastroenterology;  Laterality: N/A;  . CORONARY STENT INTERVENTION N/A 07/31/2019   Procedure: CORONARY STENT INTERVENTION;  Surgeon: Isaias Cowman, MD;  Location: [place] CV LAB;  Service: Cardiovascular;  Laterality: N/A;  RCA  . CORONARY STENT PLACEMENT    . ESOPHAGOGASTRODUODENOSCOPY N/A 11/05/2019   Procedure: ESOPHAGOGASTRODUODENOSCOPY (EGD);  Surgeon: Toledo, Benay Pike, MD;  Location: [facility] ENDOSCOPY;  Service: Gastroenterology;  Laterality: N/A;  . ESOPHAGOGASTRODUODENOSCOPY (EGD) WITH PROPOFOL N/A 05/26/2016   Procedure: ESOPHAGOGASTRODUODENOSCOPY (EGD) WITH PROPOFOL;  Surgeon: Lucilla Lame, MD;  Location:  [facility] ENDOSCOPY;  Service: Endoscopy;  Laterality: N/A;  . GIVENS CAPSULE STUDY N/A 05/05/2016   Procedure: GIVENS CAPSULE STUDY;  Surgeon: Lucilla Lame, MD;  Location: [facility] ENDOSCOPY;  Service: Endoscopy;  Laterality: N/A;  . JOINT REPLACEMENT    . LEFT HEART CATH AND CORONARY ANGIOGRAPHY Left 07/31/2019   Procedure: LEFT HEART CATH AND CORONARY ANGIOGRAPHY;  Surgeon: Isaias Cowman, MD;  Location: [place] CV LAB;  Service: Cardiovascular;  Laterality: Left;  . REDUCTION MAMMAPLASTY    . REPLACEMENT TOTAL KNEE    . SHOULDER SURGERY    . TIBIA FRACTURE SURGERY      SOCIAL HISTORY: Social History   Socioeconomic History  . Marital status: Divorced    Spouse name: Not on file  . Number of children: 1  . Years of education: 49  . Highest education level: 12th grade  Occupational History  . Occupation: retired   Tobacco Use  . Smoking status: Former Smoker    Packs/day: 2.00    Years: 45.00    Pack years: 90.00    Types: Cigarettes    Quit date: 11/06/2011    Years since quitting: 8.6  . Smokeless tobacco: Never Used  Vaping Use  . Vaping Use: Never used  Substance and Sexual Activity  . Alcohol use: No  . Drug use: No  . Sexual activity: Never  Other Topics Concern  . Not on file  Social History Narrative   Lives alone.  She has one grown son.   She works as a Retail buyer at [employer]   Highest level of education was high school   Social Determinants of Health   Financial Resource Strain:   . Difficulty of Paying Living Expenses: Not on file  Food Insecurity:   . Worried About Charity fundraiser in the Last Year: Not on file  . Ran Out of Food in the Last Year: Not on file  Transportation Needs:   . Lack of Transportation (Medical): Not on file  . Lack of Transportation (Non-Medical): Not on file  Physical Activity:   . Days of Exercise per Week: Not on file  . Minutes of Exercise per Session: Not on file  Stress:   . Feeling of Stress : Not on file    Social Connections:   . Frequency of Communication with Friends and Family: Not on file  . Frequency of Social Gatherings with Friends and Family: Not on file  . Attends Religious Services: Not on file  . Active Member of Clubs or Organizations: Not on file  . Attends Archivist Meetings: Not on file  . Marital Status: Not on file  Intimate Partner Violence:   . Fear of Current or Ex-Partner: Not on file  . Emotionally Abused: Not on file  . Physically Abused: Not on file  . Sexually Abused: Not on file    FAMILY HISTORY: Family History  Problem Relation Age of Onset  . Lung cancer Maternal Aunt   . Breast cancer Maternal Aunt 57  . Other Mother        Killed in car accident, 71  . Other Father        Killed in car accident, 72  . Thyroid disease Brother   . Thyroid disease Sister     ALLERGIES:  has No Known Allergies.  MEDICATIONS:  Current Outpatient Medications  Medication Sig Dispense Refill  . aspirin 81 MG tablet Take 81 mg by mouth daily.    . Cholecalciferol (VITAMIN D) 50 MCG (2000 UT) CAPS Take 2,000 Units by mouth daily.     . clonazePAM (KLONOPIN) 0.5 MG tablet Take 0.5 mg by mouth 2 (two) times daily.     Marland Kitchen docusate sodium (COLACE) 100 MG capsule Take 100 mg by mouth 2 (two) times daily.    . ferrous gluconate (FERGON) 324 MG tablet Take 1 tablet (324 mg total) by mouth daily.  11  . glucose blood (ONE TOUCH ULTRA TEST) test strip USE ONE STRIP TO CHECK GLUCOSE TWICE DAILY 100 each 12  . levETIRAcetam (KEPPRA) 500 MG tablet Take 500 mg by mouth 2 (two) times daily.     Marland Kitchen lisinopril (ZESTRIL) 10 MG tablet Take 1 tablet (10 mg total) by mouth daily. (Patient taking differently: Take 20 mg by mouth daily. ) 30 tablet 0  . metFORMIN (GLUCOPHAGE) 1000 MG tablet Take 0.5 tablets (500 mg total) by mouth 2 (two) times daily. 60 tablet 0  . SUMAtriptan (IMITREX) 100 MG tablet Take 1 tablet (100 mg total) by mouth every 2 (two) hours as needed for migraine.  May repeat in 2 hours if headache persists or recurs. 10 tablet 12  . SYNTHROID 100 MCG tablet Take 1 tablet (100 mcg total) by mouth daily before breakfast. 90 tablet 3  . REPATHA SURECLICK 948 MG/ML SOAJ Inject 1 Syringe into the skin every 14 (  fourteen) days.     No current facility-administered medications for this visit.     PHYSICAL EXAMINATION: ECOG PERFORMANCE STATUS: 1 - Symptomatic but completely ambulatory Vitals:   07/16/20 1321  BP: 135/84  Pulse: 91  Resp: 18  Temp: 98 F (36.7 C)   Filed Weights   07/16/20 1321  Weight: 189 lb 6.4 oz (85.9 kg)   Physical Exam Constitutional:      General: She is not in acute distress.    Appearance: She is not diaphoretic.     Comments: Patient walks independently  HENT:     Head: Normocephalic and atraumatic.     Nose: Nose normal.     Mouth/Throat:     Pharynx: No oropharyngeal exudate.  Eyes:     General: No scleral icterus.    Pupils: Pupils are equal, round, and reactive to light.  Cardiovascular:     Rate and Rhythm: Normal rate and regular rhythm.     Heart sounds: No murmur heard.   Pulmonary:     Effort: Pulmonary effort is normal. No respiratory distress.     Breath sounds: No rales.  Chest:     Chest wall: No tenderness.  Abdominal:     General: There is no distension.     Palpations: Abdomen is soft.     Tenderness: There is no abdominal tenderness.  Musculoskeletal:        General: Normal range of motion.     Cervical back: Normal range of motion and neck supple.  Skin:    General: Skin is warm and dry.     Findings: No erythema.  Neurological:     Mental Status: She is alert and oriented to person, place, and time.     Cranial Nerves: No cranial nerve deficit.     Motor: No abnormal muscle tone.     Coordination: Coordination normal.  Psychiatric:        Mood and Affect: Affect normal.       LABORATORY DATA:  I have reviewed the data as listed Lab Results  Component Value Date   WBC  5.8 07/15/2020   HGB 11.5 (L) 07/15/2020   HCT 34.2 (L) 07/15/2020   MCV 96.3 07/15/2020   PLT 230 07/15/2020   Recent Labs    01/03/20 1059 01/03/20 1059 02/13/20 1330 02/13/20 1330 04/12/20 1736 04/13/20 0740 04/14/20 0320  NA 140   < > 139   < > 140 138 137  K 4.8   < > 5.2*   < > 4.6 5.1 4.6  CL 106   < > 104   < > 104 104 106  CO2 25   < > 26   < > 28 23 24   GLUCOSE 125*   < > 92   < > 133* 119* 118*  BUN 22   < > 30*   < > 29* 27* 24*  CREATININE 1.07*   < > 1.06*   < > 1.24* 1.04* 1.10*  CALCIUM 9.2   < > 9.8   < > 9.6 9.8 9.1  GFRNONAA 53*   < > 53*   < > 44* 54* 50*  GFRAA >60   < > >60   < > 51* >60 58*  PROT 7.3  --  8.4*  --  7.7  --   --   ALBUMIN 3.9  --  4.5  --  4.2  --   --   AST 18  --  21  --  19  --   --   ALT 13  --  17  --  14  --   --   ALKPHOS 64  --  78  --  71  --   --   BILITOT 0.4  --  0.5  --  0.6  --   --    < > = values in this interval not displayed.   Iron/TIBC/Ferritin/ %Sat    Component Value Date/Time   IRON 78 07/15/2020 1350   IRON 17 (L) 11/02/2019 1008   IRON 48 12/13/2014 1455   TIBC 301 07/15/2020 1350   TIBC 353 11/02/2019 1008   TIBC 378 12/13/2014 1455   FERRITIN 57 07/15/2020 1350   FERRITIN 15 11/02/2019 1008   FERRITIN 11 12/13/2014 1455   IRONPCTSAT 26 07/15/2020 1350   IRONPCTSAT 5 (LL) 11/02/2019 1008   IRONPCTSAT 12.7 12/13/2014 1455        ASSESSMENT & PLAN:  1. Iron deficiency anemia due to chronic blood loss   2. Stage 3a chronic kidney disease (HCC)   3. Other fatigue   4. Arthralgia, unspecified joint   Cancer Staging Malignant neoplasm of female breast [facility]) Staging form: Breast, AJCC 8th Edition - Clinical stage from 11/24/2019: Stage IA (cT1b, cN0, cM0, G1, ER+, PR+, HER2-) - Signed by Earlie Server, MD on 11/24/2019  #Iron deficiency anemia Lab was reviewed and discussed with patient 07/15/2020, hemoglobin 11.5, iron saturation showed ferritin of 57, iron saturation 26. In the context of chronic  kidney disease, her hemoglobin of 11.5 is acceptable. With her history of AVM, I recommend patient to continue oral iron supplementation as maintenance. Continue follow-up with gastroenterology.  Fatigue appears to be not proportionate to her anemia level.  TSH was checked in August 2021 and was normal at 2.168. Chronic arthralgia.  I recommend patient to follow-up closely with primary care provider for further evaluation.  #Invasive breast cancer, stage IA: She has switched her oncology care to [facility]. Defer to [facility] oncology for management.   Stage IIIA CKD Multiple myeloma panel showed no M protein.  Increase light chain ratio which is common in patient with chronic kidney disease.  Urine protein electrophoresis showed no monoclonal protein.  I will hold off additional work-up from hematology aspect.  I will discharge patient from our clinic as currently on her anemia seems to be well controlled.  She is welcome to give [place] a call back if she develops anemia in the future.  I encourage patient to further follow-up with primary care provider with her other complaints for further evaluation and work-up.  Continue follow-up with [facility] oncology for management of invasive breast cancer.   All questions were answered. The patient knows to call the clinic with any problems questions or concerns. Earlie Server, MD, PhD Hematology Oncology [facility] Pager- [phone] 07/16/2020

## 2020-07-23 NOTE — Unmapped (Signed)
[facility] Refill Coordination Note    Specialty Medication(s) to be Shipped:   General Specialty: Repatha    Other medication(s) to be shipped: No additional medications requested for fill at this time     Amber Duncan, DOB: 03-16-1951  Phone: [phone] (home)       All above HIPAA information was verified with patient.     Was a Nurse, learning disability used for this call? No    Completed refill call assessment today to schedule patient's medication shipment from the [facility] [phone]).       Specialty medication(s) and dose(s) confirmed: Regimen is correct and unchanged.   Changes to medications: Adisyn reports no changes at this time.  Changes to insurance: No  Questions for the pharmacist: No    Confirmed patient received Welcome Packet with first shipment. The patient will receive a drug information handout for each medication shipped and additional FDA Medication Guides as required.       DISEASE/MEDICATION-SPECIFIC INFORMATION        For patients on injectable medications: Patient currently has 0 doses left.  Next injection is scheduled for 07/31/20.    SPECIALTY MEDICATION ADHERENCE     Medication Adherence    Patient reported X missed doses in the last month: 0  Specialty Medication: Repatha 140mg /ml  Patient is on additional specialty medications: No  Patient is on more than two specialty medications: No                Repatha 140 mg/ml: 0 days of medicine on hand         SHIPPING     Shipping address confirmed in Epic.     Delivery Scheduled: Yes, Expected medication delivery date: 07/29/20.     Medication will be delivered via Same Day Courier to the prescription address in Epic WAM.    Nancy Nordmann [facility] Specialty Technician

## 2020-07-29 MED FILL — REPATHA SURECLICK 140 MG/ML SUBCUTANEOUS PEN INJECTOR: 28 days supply | Qty: 2 | Fill #3 | Status: AC

## 2020-07-29 MED FILL — REPATHA SURECLICK 140 MG/ML SUBCUTANEOUS PEN INJECTOR: SUBCUTANEOUS | 28 days supply | Qty: 2 | Fill #3

## 2020-08-05 ENCOUNTER — Ambulatory Visit: Admit: 2020-08-05 | Discharge: 2020-08-06 | Payer: PRIVATE HEALTH INSURANCE

## 2020-08-05 ENCOUNTER — Ambulatory Visit
Admit: 2020-08-05 | Discharge: 2020-08-06 | Payer: PRIVATE HEALTH INSURANCE | Attending: Adult Health | Primary: Adult Health

## 2020-08-05 DIAGNOSIS — E782 Mixed hyperlipidemia: Principal | ICD-10-CM

## 2020-08-05 DIAGNOSIS — M25552 Pain in left hip: Principal | ICD-10-CM

## 2020-08-05 DIAGNOSIS — Z9049 Acquired absence of other specified parts of digestive tract: Principal | ICD-10-CM

## 2020-08-05 DIAGNOSIS — I10 Essential (primary) hypertension: Principal | ICD-10-CM

## 2020-08-05 DIAGNOSIS — N644 Mastodynia: Principal | ICD-10-CM

## 2020-08-05 DIAGNOSIS — M25551 Pain in right hip: Principal | ICD-10-CM

## 2020-08-05 DIAGNOSIS — J449 Chronic obstructive pulmonary disease, unspecified: Principal | ICD-10-CM

## 2020-08-05 DIAGNOSIS — Z9289 Personal history of other medical treatment: Principal | ICD-10-CM

## 2020-08-05 DIAGNOSIS — E079 Disorder of thyroid, unspecified: Principal | ICD-10-CM

## 2020-08-05 DIAGNOSIS — Z79890 Hormone replacement therapy (postmenopausal): Principal | ICD-10-CM

## 2020-08-05 DIAGNOSIS — E785 Hyperlipidemia, unspecified: Principal | ICD-10-CM

## 2020-08-05 DIAGNOSIS — Z923 Personal history of irradiation: Principal | ICD-10-CM

## 2020-08-05 DIAGNOSIS — Z7984 Long term (current) use of oral hypoglycemic drugs: Principal | ICD-10-CM

## 2020-08-05 DIAGNOSIS — M79622 Pain in left upper arm: Principal | ICD-10-CM

## 2020-08-05 DIAGNOSIS — C4000 Malignant neoplasm of scapula and long bones of unspecified upper limb: Principal | ICD-10-CM

## 2020-08-05 DIAGNOSIS — C50912 Malignant neoplasm of unspecified site of left female breast: Principal | ICD-10-CM

## 2020-08-05 DIAGNOSIS — Z170 Estrogen receptor positive status [ER+]: Principal | ICD-10-CM

## 2020-08-05 DIAGNOSIS — Z87891 Personal history of nicotine dependence: Principal | ICD-10-CM

## 2020-08-05 DIAGNOSIS — Z955 Presence of coronary angioplasty implant and graft: Principal | ICD-10-CM

## 2020-08-05 DIAGNOSIS — G43909 Migraine, unspecified, not intractable, without status migrainosus: Principal | ICD-10-CM

## 2020-08-05 DIAGNOSIS — R922 Inconclusive mammogram: Principal | ICD-10-CM

## 2020-08-05 DIAGNOSIS — Z9012 Acquired absence of left breast and nipple: Principal | ICD-10-CM

## 2020-08-05 DIAGNOSIS — E119 Type 2 diabetes mellitus without complications: Principal | ICD-10-CM

## 2020-08-05 DIAGNOSIS — C50312 Malignant neoplasm of lower-inner quadrant of left female breast: Principal | ICD-10-CM

## 2020-08-05 DIAGNOSIS — Z6831 Body mass index (BMI) 31.0-31.9, adult: Secondary | ICD-10-CM | POA: Diagnosis not present

## 2020-08-05 LAB — LIPID PANEL
CHOLESTEROL/HDL RATIO SCREEN: 2.5 (ref 1.0–4.5)
CHOLESTEROL: 112 mg/dL (ref <=200)
HDL CHOLESTEROL: 44 mg/dL (ref 40–60)
LDL CHOLESTEROL CALCULATED: 34 mg/dL — ABNORMAL LOW (ref 40–99)
NON-HDL CHOLESTEROL: 68 mg/dL — ABNORMAL LOW (ref 70–130)
TRIGLYCERIDES: 170 mg/dL — ABNORMAL HIGH (ref 0–150)
VLDL CHOLESTEROL CAL: 34 mg/dL (ref 11–41)

## 2020-08-05 LAB — LDL CHOLESTEROL, DIRECT: LDL CHOLESTEROL DIRECT: 48.5 mg/dL

## 2020-08-05 NOTE — Unmapped (Signed)
It was a pleasure to see you today in the Breast Surgical Oncology Clinic. Please call my nurse navigator, Lindwood Qua, if you have any interval questions or concerns.     1. Follow up in 1 year for exam.  2. Continue surveillance as outlined. NCCN recommends that you continue to see your primary care provider for all general health care recommended for a patient your age, including cancer screening tests.  Any symptoms should be brought to the attention of your provider.  You should see your medical oncologist every 3 to 6 months for the first 3 years, every 6 to 12 months for years 4 and 5, and annually thereafter. Visits with your surgeon and radiation oncologist should be rotated with your medical oncology team initially until you are released to medical oncology or a survivorship clinic. The surgery visit should typically be paired with the imaging appointment so that we can review mammogram findings and recommendations.     Surgical oncology contact information:  For appointments, please call [phone].     Nurse Navigator available Monday through Friday 8:30 am to 4:30 pm:   Lindwood Qua, RN:    Phone: [phone]   Fax: [phone] attention Vivia Budge    For emergencies, evenings or weekends, please call [phone] and ask for the surgical oncology resident on call. Please be aware that this person is also responding to in-hospital emergencies and patient issues and may not answer your phone call immediately, but will return your call as soon as possible.

## 2020-08-05 NOTE — Unmapped (Signed)
Patient Name: Amber Duncan  Patient Age: 69 y.o.  Encounter Date: 08/05/2020    Referring Physician:   Referred Self  No address on file    Primary Care Provider:  Gracelyn Nurse, MD    Breast Cancer Treatment Team:  Surgical Oncology: Sherilyn Cooter, DO  Surgical Oncology NP: Pollyann Samples, NP  Radiation Oncology: Rayetta Humphrey, MD  Medical Oncology: Royal Hawthorn, MD (has also been followed by Rickard Patience, MD at [facility])    Reason for Visit: Breast cancer surveillance visit    HPI:  Amber Duncan is a 69 y.o. female who presents for followup after breast cancer surgery. Below is a synopsis of past and present health information pertinent to her breast cancer diagnosis, treatment and current health status. She is unaccompanied today. Amber Duncan was last seen in clinic on 01/29/2020. Since that visit she denies associated symptoms including pain, redness, fever, chills, breast mass, breast retraction, nipple inversion, breast swelling or nipple discharge.  She had a light heart attack in early August.  She is now following with Dr. Barbette Merino in this regard and is working on her cholesterol.  She tolerated radiation well with some ongoing breast discomfort that she relates to ongoing healing.  She notes bilateral hip pain and pain in her left upper arm at the humerus that was present prior to her cancer diagnosis.  No lymphedema or difficulty with ROM.  No other specific complaints today.     Historically, Amber Duncan presented with an abnormal screening mammogram    Diagnosis:    1. Left breast infiltrating ductal carcinoma  Cancer Staging  Malignant neoplasm of lower-inner quadrant of left breast in female, estrogen receptor positive (CMS-HCC)  Staging form: Breast, AJCC 8th Edition  - Clinical stage from 12/27/2019: Stage IA (cT1b, cN0, cM0, G1, ER+, PR+, HER2-) - Signed by Talbert Cage, DO on 12/27/2019  - Pathologic stage from 01/29/2020: Stage IA (pT1b, pN0, cM0, G1, ER+, PR+, HER2-) - Signed by Talbert Cage, DO on 02/06/2020      Procedure:   1. Left US guided partial mastectomy, performed on 01/19/2020.  SLNBx omitted per [facility] criteria.    Clinical Trial Participant:   1. No clinical trials at this time.    Medical Oncology: declined by patient with Royal Hawthorn, MD    Radiation Oncology: whole breast irradiation with Rayetta Humphrey, MD    Review of Systems:  10 organ systems reviewed and pertinent as noted in HPI.       Medical history reviewed as below. I have also reviewed additional records as provided.  Oncology History Overview Note   2021: Left breast pT1b N0 IDC, G1, +/+/-     Malignant neoplasm of lower-inner quadrant of left breast in female, estrogen receptor positive (CMS-HCC)   10/24/2019 -  Presenting Symptoms    Abnormal screening MMG: asymmetry in the left breast     11/07/2019 Interval Scan(s)    MMG with tomo: Left breast LOQ small mass with associated distortion.  [place]: Left breast 7 x 4 x 6 mm irregular hypoechoic mass at the 5 o???clock 4 CFN position.     11/15/2019 Biopsy    Left breast stereotactic biopsy 5:00, 4CFN: IDC, G1, ER 95% positive, PR 80% positive. HER2- (1+) and associated DCIS.     12/26/2019 Initial Diagnosis    Malignant neoplasm of lower-inner quadrant of left breast in female, estrogen receptor positive (CMS-HCC)     12/27/2019 Tumor Board    [facility]  recs: Left breast cT1b N0 IDC, G1, +/+/-. 7 mm on imaging review, excellent candidate for BCT. Candidate to omit axillary staging. Possible candidate to omit radiation. Will see med/onc post op.      12/27/2019 -  Cancer Staged    Staging form: Breast, AJCC 8th Edition  - Clinical stage from 12/27/2019: Stage IA (cT1b, cN0, cM0, G1, ER+, PR+, HER2-) - Signed by Talbert Cage, DO on 12/27/2019       01/19/2020 Surgery    Left US-guided partial mastectomy performed by Dr. Dellis Anes. Final pathology showed IDC 7mm in greatest dimension with DCIS grade 2, all margins clear     01/29/2020 -  Cancer Staged    Staging form: Breast, AJCC 8th Edition  - Pathologic stage from 01/29/2020: Stage IA (pT1b, pN0, cM0, G1, ER+, PR+, HER2-) - Signed by Talbert Cage, DO on 02/06/2020         Past Medical History:   Diagnosis Date   ??? Breast cancer (CMS-HCC)    ??? COPD (chronic obstructive pulmonary disease) (CMS-HCC)    ??? Coronary artery disease    ??? Diabetes (CMS-HCC)    ??? Disease of thyroid gland    ??? Fatigue    ??? Hepatitis    ??? Hepatitis    ??? Hyperlipidemia    ??? Hypertension    ??? Migraine    ??? Sleep apnea      Past Surgical History:   Procedure Laterality Date   ??? BACK SURGERY     ??? BREAST BIOPSY     ??? BREAST LUMPECTOMY Left 01/2020   ??? CATARACT EXTRACTION     ??? CHOLECYSTECTOMY     ??? CORONARY STENT PLACEMENT     ??? HYSTERECTOMY     ??? KNEE SURGERY     ??? PR COLONOSCOPY,ABLATE LESION N/A 01/21/2017    Procedure: COLONOSCOPY FLEX; W/ABLAT LES NOT AMENABLE-SNARE;  Surgeon: Luanne Bras, MD;  Location: GI PROCEDURES [facility];  Service: Gastroenterology   ??? PR COLSC FLX W/RMVL OF TUMOR POLYP LESION SNARE TQ N/A 01/21/2017    Procedure: COLONOSCOPY FLEX; W/REMOV TUMOR/LES BY SNARE;  Surgeon: Luanne Bras, MD;  Location: [facility];  Service: Gastroenterology   ??? PR MASTECTOMY, PARTIAL Left 01/19/2020    Procedure: MASTECTOMY, PARTIAL (EG, LUMPECTOMY, TYLECTOMY, QUADRANTECTOMY, SEGMENTECTOMY);  Surgeon: Talbert Cage, DO;  Location: MAIN OR [facility];  Service: Surgical Oncology Breast   ??? PR UPPER GI ENDOSCOPY,BIOPSY N/A 04/29/2018    Procedure: UGI ENDOSCOPY; WITH BIOPSY, SINGLE OR MULTIPLE;  Surgeon: Rona Ravens, MD;  [facility];  Service: Gastroenterology   ??? RADIATION Left    ??? REDUCTION MAMMAPLASTY       Family History   Problem Relation Age of Onset   ??? Cancer Brother    ??? Cancer Maternal Aunt    ??? Cancer Maternal Uncle    ??? Diabetes Paternal Uncle    ??? No Known Problems Mother    ??? No Known Problems Father    ??? No Known Problems Sister ??? No Known Problems Son    ??? No Known Problems Brother    ??? No Known Problems Sister    ??? No Known Problems Sister    ??? No Known Problems Brother    ??? No Known Problems Daughter    ??? No Known Problems Maternal Grandmother    ??? No Known Problems Maternal Grandfather    ??? No Known Problems Paternal Grandmother    ???  No Known Problems Paternal Grandfather    ??? No Known Problems Other    ??? Heart failure Neg Hx    ??? BRCA 1/2 Neg Hx    ??? Breast cancer Neg Hx    ??? Colon cancer Neg Hx    ??? Endometrial cancer Neg Hx    ??? Ovarian cancer Neg Hx      Social History     Socioeconomic History   ??? Marital status: Divorced     Spouse name: Not on file   ??? Number of children: Not on file   ??? Years of education: Not on file   ??? Highest education level: Not on file   Occupational History   ??? Not on file   Tobacco Use   ??? Smoking status: Former Smoker     Packs/day: 2.00     Years: 48.00     Pack years: 96.00     Types: Cigarettes     Quit date: 11/06/2011     Years since quitting: 8.7   ??? Smokeless tobacco: Never Used   Substance and Sexual Activity   ??? Alcohol use: No   ??? Drug use: No   ??? Sexual activity: Not on file   Other Topics Concern   ??? Not on file   Social History Narrative   ??? Not on file     Social Determinants of Health     Financial Resource Strain: Not on file   Food Insecurity: Not on file   Transportation Needs: Not on file   Physical Activity: Not on file   Stress: Not on file   Social Connections: Not on file       Current Outpatient Medications:   ???  aspirin-calcium carbonate 81 mg-300 mg calcium(777 mg) Tab, Take 81 mg by mouth., Disp: , Rfl:   ???  clonazePAM (KLONOPIN) 0.5 MG tablet, Take 1 tablet (0.5 mg total) by mouth two (2) times a day as needed for anxiety., Disp: 180 tablet, Rfl: 0  ???  docusate sodium (COLACE) 100 MG capsule, Take 100 mg by mouth daily., Disp: , Rfl:   ???  empty container (SHARPS-A-GATOR DISPOSAL SYSTEM) Misc, Use as directed for sharps disposal, Disp: 1 each, Rfl: 2  ???  evolocumab (REPATHA SURECLICK) 140 mg/mL PnIj, Inject the contents of 1 pen (140 mg) under the skin every fourteen (14) days., Disp: 2 mL, Rfl: 3  ???  ferrous gluconate 324 MG tablet, Take 324 mg by mouth once., Disp: , Rfl:   ???  levETIRAcetam (KEPPRA) 500 MG tablet, Take 1 tablet (500 mg total) by mouth Two (2) times a day., Disp: 180 tablet, Rfl: 1  ???  levothyroxine (SYNTHROID) 100 MCG tablet, Take 100 mcg by mouth., Disp: , Rfl:   ???  lisinopriL (PRINIVIL,ZESTRIL) 20 MG tablet, Take 1 tablet (20 mg total) by mouth daily., Disp: 90 tablet, Rfl: 3  ???  metFORMIN (GLUCOPHAGE) 500 MG tablet, Take 1,000 mg by mouth 2 (two) times a day with meals. , Disp: , Rfl:   ???  pravastatin (PRAVACHOL) 10 MG tablet, Take 0.5 tablets (5 mg total) by mouth daily., Disp: 15 tablet, Rfl: 3  ???  SUMAtriptan (IMITREX) 100 MG tablet, Take one tab at onset of headache, may repeat in 2 hours if headache persists of recurs, Disp: 9 tablet, Rfl: 5  No Known Allergies      Physical Examination:   Blood pressure 168/74, pulse 90, temperature 35.9 ??C (96.7 ??F), temperature source Temporal, height 165.1  cm (5' 5), weight 85.7 kg (189 lb), SpO2 97 %.  General Appearance:  No acute distress, well appearing and well nourished.   Head:  Normocephalic, atraumatic.   Eyes:  Conjuctiva and lids appear normal. Pupils equal and round,   sclera anicteric.   Ears:  Overall appearance normal with no scars, lesions or               masses.  Hearing is grossly normal.   Neck: Supple, symmetrical, trachea midline, no adenopathy;   thyroid: no enlargement/tenderness/nodules.   Pulmonary:    Normal respiratory effort.    Musculoskeletal: Normal gait. Extremities without clubbing, cyanosis, or           edema.   Skin: Skin color, texture, turgor normal, no rashes or lesions.   Neurologic: No motor abnormalities noted. Sensation grossly intact.   Lymphatic:  Breast: No cervical or supraclavicular LAD noted.   A comprehensive examination of the breasts and chest wall was performed with the patient upright and supine with arms at her sides and above her head.   Left breast normal in size, normal symmetry, normal contour, no dimpling, nipple everted, nipple exhibits no crusting or excoriation, no masses or nodules, no palpable axillary adenopathy. Skin is slightly hyperpigmented from prior radiation.  infraclavicular and supraclavicular nodal examinations are negative and arm circumference appears symmetric without evidence of lymphedema. Cosmetic outcome is excellent.  Right breast normal in size, normal symmetry, normal contour, no dimpling, no skin changes, nipple everted, nipple exhibits no crusting or excoriation, no masses or nodules, no palpable axillary adenopathy.   Psychiatric: Judgement and insight appropriate. Oriented to person,         place, and time.       Imaging Review:  I personally reviewed all imaging, including diagnostic mammogram with spot compression/magnification views.     Radiologist's report is pending.       I have personally reviewed the breast imaging, laboratory values, existing medical records, and pathology. I have summarized these findings and my interpretation in the oncology history and assessment above.    Impression:  Ms. Haynie is a 69 y.o. female with a history of left breast infiltrating ductal carcinoma, estrogen receptor positive, progesterone receptor positive and HER2 negative. See treatment summary above.   Staging  Cancer Staging  Malignant neoplasm of lower-inner quadrant of left breast in female, estrogen receptor positive (CMS-HCC)  Staging form: Breast, AJCC 8th Edition  - Clinical stage from 12/27/2019: Stage IA (cT1b, cN0, cM0, G1, ER+, PR+, HER2-) - Signed by Talbert Cage, DO on 12/27/2019  - Pathologic stage from 01/29/2020: Stage IA (pT1b, pN0, cM0, G1, ER+, PR+, HER2-) - Signed by Talbert Cage, DO on 02/06/2020    There is no evidence of disease at 6 months.    Recommendations:  1. Patient declined endocrine therapy and is comfortable with this decision.  2. Follow-up in six months with rad/onc with bilateral mammogram as scheduled.  3. RTC with [place] in one year for exam, sooner for any new breast complaints.     All of the patient's questions and concerns were addressed during the visit and she is in agreement with the plan of care. Please do not hesitate to contact me with questions or concerns.    Cancer Staging  Malignant neoplasm of lower-inner quadrant of left breast in female, estrogen receptor positive (CMS-HCC)  Staging form: Breast, AJCC 8th Edition  - Clinical stage from 12/27/2019: Stage IA (cT1b, cN0, cM0, G1,  ER+, PR+, HER2-) - Signed by Talbert Cage, DO on 12/27/2019  - Pathologic stage from 01/29/2020: Stage IA (pT1b, pN0, cM0, G1, ER+, PR+, HER2-) - Signed by Talbert Cage, DO on 02/06/2020      Pollyann Samples, NP-C  Surgical Breast Oncology  08/05/2020  3:47 PM

## 2020-08-06 ENCOUNTER — Institutional Professional Consult (permissible substitution)
Admit: 2020-08-06 | Discharge: 2020-08-07 | Payer: PRIVATE HEALTH INSURANCE | Attending: Pharmacotherapy | Primary: Pharmacotherapy

## 2020-08-06 NOTE — Unmapped (Addendum)
It was good speaking with you today.    As discussed:  ??? No medication changes today.   ??? Continue other medications  ??? Make sure medication list matches medications at home  ??? Monitor and log blood pressure and heart rate  ??? Avoid NSAIDs (such as ibuprofen or naproxen)  ??? Next in-clinic visit 10/15/2020 with Dr. Barbette Merino.   ??? Please let us know if you have any issues    Call the clinic at [phone] with questions.    Our clinic fax number is [phone].    If you need to reschedule future appointments, please call [phone] or [phone]    After office hours, if you have urgent questions/problems, contact the on-call cardiologist through the hospital operator: [phone].

## 2020-08-06 NOTE — Unmapped (Signed)
[facility]  [address]  [place], [place] [address]  PHONE: [phone]  FAX: [phone]           CLINICAL PHARMACIST PRACTITIONER NOTE   LIPID THERAPY    Referring Provider: Ramond Marrow, MD   Cardiologist: Amber Marrow, MD   Primary Care Provider: Gracelyn Nurse, MD    Assessment and Plan:     #ASCVD Risk Reduction Initially underwent PCI to RCA in 2008. Subsequently experienced exertional CP in Fall 2020. She underwent a myocardial perfusion scan followed by a left heart catheterization. She underwent placement of DES x1 to RCA on 07/31/19. She has a history of intolerance to multiple statins (myalgias), as well as marked hyperglycemia with rosuvastatin 5 mg daily. She denies any children or siblings have elevated cholesterol levels. Today she is doing well and is without complaint. Endorses continued fatigue which is a chronic problem for her. She continues to tolerate Repatha. Repeat lipid panel last collected on 08/05/20 showed an improved direct LDL at 48.5 mg/dL (down from direct LDL 13.0 mg/dL previously). At our last visit we increased pravastatin 5 mg from twice weekly to 5 mg daily, which she reports tolerating well since our last visit with no myalgias. Today we discussed making no medication changes and continuing pravastatin 5 mg once daily, which she was amenable to. We congratulated her on her progress over the last few visits with her cholesterol. [facility] verbalized understanding of the above plan and was given an opportunity to ask questions, all of which were answered.   ?? No medication changes today.   ?? Continue pravastatin to 5 mg daily.   ?? Note, history of statin intolerance: she developed myalgias to multiple statins in the past, and became markedly hyperglycemic on rosuvastatin  ?? Has tolerated pravastatin well to this point  ?? Continue Repatha 140 mg subcutaneous every 2 weeks.   ?? Key risk factors include: CAD, PCI, Hypertension, hemoglobin A1c 6.1%, BMI (31.5 kg/m2), Lipids (LDL 48.5 mg/dL, triglycerides 865 mg/dL) and former smoker, breast cancer  ?? Continue aspirin 81 mg daily   ?? Can consider initiation of SGLT2i at a future visit for further CV risk reduction. Would monitor closely for recurrent UTI's when initiating given patient's history with low threshold to discontinue.   ?? Jardiance preferred by insurance, estimated copay $15/month     #HTN [facility] reports stable BP's at home, unchanged from our prior visit ranging from 90-100's/60-70 mmHg.  She denies lightheadedness or dizziness. She had an elevated BP during her appointment on 08/05/20 of 168/74, however this was in the setting of waiting to go for her surveillence mammogram, which caused significant anxiety. We discussed not making any changes to her anti-hypertensive regimen today.  ?? Continue lisinopril 20 mg daily    #Type 2 Diabetes Mellitus   [facility] reports controlled BG at home, with morning fasting values around 180-200 mg/dL over the last week. However, she feels Thanksgiving meals likely contributed to these higher fasting glucoses. She denies any symptoms of hyper- or hypoglycemia. Last HbA1c 6.3% (August 2021, [facility]). We had previously discussed the option of initiating an SGLT2 inhibitor, however today discussed making no mediation changes. She has made an appointment with a new PCP in the Spring who will become the primary provider for managing her blood glucose.   ?? Continue metformin 1000 mg twice daily  ?? Consider initiation of SGLT2i at a future visit for further CV risk reduction. Would monitor closely for recurrent  UTI's when initiating given patient's history with low threshold to discontinue.   ?? Alternatively, may consider GLP-1 RA (such as liraglutide) for ASCVD risk reduction     #Medication Adherence/Monitoring:   ?? Reviewed the indication, dose, and frequency of each medication with patient   ?? Medications reviewed in EPIC medication station and updated today by the clinical pharmacist practitioner.    #FOLLOW UP:   ?? No further pharmacist follow-up indicated at this time    ?? Return cardiologist appointment on 10/15/20 as scheduled    I spent a total of 15 minutes on the phone with the patient delivering clinical care and providing education/counseling.        [facility], PharmD  PGY2 Cardiology Pharmacy Resident      [facility], PharmD, BCPS, [facility], CPP  Cardiology Clinical Pharmacist Practitioner  [facility]  [address]  [place], [place] [address]  Phone: [phone]  Fax: [phone]        History of Present Illness:   [facility] is a 69 y.o. year old female with a history of CAD s/p PCI, HTN, HLD who presents today for consideration of PCSK9 inhibitor therapy and lipid therapy optimization at the request of Dr. Barbette Merino. [facility] was diagnosed with CAD in 2008 and underwent PCI at that time. She had no further cardiac issues until Fall 2020 when she developed exertional chest tightness that radiated down her left arm. She underwent a myocardial perfusion scan followed by a left heart catheterization, during which she underwent PCI with DES x1 to the RCA (07/31/19). Thereafter her exertional symptoms resolved and have not recurred. [facility] has a history of statin intolerance, she has developed myalgias to multiple statins in the past, and became markedly hyperglycemic on rosuvastatin.     Since being seen in cardiology clinic on 04/09/20, [name] reported to [facility] ED with near syncope on 04/13/20. She improved with IV hydration and was not orthostatic. BP at that time was 139/87 mm Hg per [facility]. She was discharged from the ED.    [facility] reports doing well although she does complain of extreme fatigue which is chronic and stable. She completed radiation last month for breast cancer; no chemotherapy is currently planned. She endorses receiving IV iron infusions for her chronic anemia, reporting her last infusion was early September.     Summary of Recent Visits and Key Medication Changes  ??? 02/06/20: Start rosuvastatin 5 mg tablet once daily   ?? 04/09/20: Stop rosuvastatin due to siginificant hyperglycemia  ?? 04/15/20: Start Rapatha 140 mg subcutaneous every 2 weeks (required manufacturer's assistance)   ?? 05/08/20: First Repatha dose confirmed.   ?? 06/25/20: Pravastatin 5 mg twice weekly started.  ?? 07/09/20: Pravastatin increased 5 mg daily.     History of myalgias with several statins and marked hyperglycemia with rosuvastatin 5 mg daily (BGs increased from ~120-135 to 250-280 mg/dL).     Interval History  She reports no dyspnea, orthopnea, paroxysmal nocturnal dyspnea, or LE edema. No palpitations, chest pain, dizziness, pre-syncope or syncope. She reports a good appetite without bloating, abd distension or early satiety.         Cardiovascular History & Procedures:  Cath / PCI:  07/31/19  ?? Mid Cx to Dist Cx lesion is 50% stenosed.   ?? Prox RCA lesion is 40% stenosed.   ?? Dist RCA lesion is 90% stenosed.   ?? Previously placed Mid RCA to Dist RCA stent (unknown type) is  widely  patent.   ?? A drug-eluting stent was successfully placed using a STENT RESOLUTE ONYX  [id number].   ?? Post intervention, there is a 0% residual stenosis.     1. ??One-vessel CAD with patent stent mid to distal RCA with 90% stenosis   in distal native vessel just beyond the stent   2. ??Normal left ventricular function   3. ??Successful PCI with DES distal RCA  ??  PCI to RCA 2008  ??  CV Surgery:  None    EP Procedures and Devices:  None  ??  Non-Invasive Evaluation(s):  Echo:  07/20/19  NORMAL LEFT VENTRICULAR SYSTOLIC FUNCTION ?? WITH MILD LVH   NORMAL RIGHT VENTRICULAR SYSTOLIC FUNCTION   MILD VALVULAR REGURGITATION (See above)   NO VALVULAR STENOSIS   TRIVIAL MR, TR   MILD PR   EF 50-55%   ??  CT/MRI/Nuclear Tests:  Myocardial perfusion scan 07/20/19  1. ??Negative ETT with nonspecific ST abnormalities   2. ??Normal left ventricular function   3. ??Normal wall motion   4. ??Minimal to mild apical wall ischemia      Past Medical History:   Diagnosis Date   ??? Breast cancer (CMS-HCC)    ??? COPD (chronic obstructive pulmonary disease) (CMS-HCC)    ??? Coronary artery disease    ??? Diabetes (CMS-HCC)    ??? Disease of thyroid gland    ??? Fatigue    ??? Hepatitis    ??? Hepatitis    ??? Hyperlipidemia    ??? Hypertension    ??? Migraine    ??? Sleep apnea         Past Surgical History:   Procedure Laterality Date   ??? BACK SURGERY     ??? BREAST BIOPSY     ??? BREAST LUMPECTOMY Left 01/2020   ??? CATARACT EXTRACTION     ??? CHOLECYSTECTOMY     ??? CORONARY STENT PLACEMENT     ??? HYSTERECTOMY     ??? KNEE SURGERY     ??? PR COLONOSCOPY,ABLATE LESION N/A 01/21/2017    Procedure: COLONOSCOPY FLEX; W/ABLAT LES NOT AMENABLE-SNARE;  Surgeon: Luanne Bras, MD;  Location: [facility];  Service: Gastroenterology   ??? PR COLSC FLX W/RMVL OF TUMOR POLYP LESION SNARE TQ N/A 01/21/2017    Procedure: COLONOSCOPY FLEX; W/REMOV TUMOR/LES BY SNARE;  Surgeon: Luanne Bras, MD;  Location: [facility];  Service: Gastroenterology   ??? PR MASTECTOMY, PARTIAL Left 01/19/2020    Procedure: MASTECTOMY, PARTIAL (EG, LUMPECTOMY, TYLECTOMY, QUADRANTECTOMY, SEGMENTECTOMY);  Surgeon: Talbert Cage, DO;  Location: [facility];  Service: Surgical Oncology Breast   ??? PR UPPER GI ENDOSCOPY,BIOPSY N/A 04/29/2018    Procedure: UGI ENDOSCOPY; WITH BIOPSY, SINGLE OR MULTIPLE;  Surgeon: Rona Ravens, MD;  [facility];  Service: Gastroenterology   ??? RADIATION Left    ??? REDUCTION MAMMAPLASTY          Social & Family History  She  reports that she quit smoking about 8 years ago. Her smoking use included cigarettes. She has a 96.00 pack-year smoking history. She has never used smokeless tobacco. She reports that she does not drink alcohol and does not use drugs.     Family History   Problem Relation Age of Onset   ??? Cancer Brother    ??? Cancer Maternal Aunt    ??? Cancer Maternal Uncle    ??? Diabetes Paternal Uncle    ??? No Known Problems  Mother    ??? No Known Problems Father    ??? No Known Problems Sister    ??? No Known Problems Son    ??? No Known Problems Brother    ??? No Known Problems Sister    ??? No Known Problems Sister    ??? No Known Problems Brother    ??? No Known Problems Daughter    ??? No Known Problems Maternal Grandmother    ??? No Known Problems Maternal Grandfather    ??? No Known Problems Paternal Grandmother    ??? No Known Problems Paternal Grandfather    ??? No Known Problems Other    ??? Heart failure Neg Hx    ??? BRCA 1/2 Neg Hx    ??? Breast cancer Neg Hx    ??? Colon cancer Neg Hx    ??? Endometrial cancer Neg Hx    ??? Ovarian cancer Neg Hx         Medications:  Reviewed and updated. Medication list includes revisions made during today's encounter.    Current Outpatient Medications   Medication Sig Dispense Refill   ??? aspirin-calcium carbonate 81 mg-300 mg calcium(777 mg) Tab Take 81 mg by mouth.     ??? clonazePAM (KLONOPIN) 0.5 MG tablet Take 1 tablet (0.5 mg total) by mouth two (2) times a day as needed for anxiety. 180 tablet 0   ??? docusate sodium (COLACE) 100 MG capsule Take 100 mg by mouth daily.     ??? empty container (SHARPS-A-GATOR DISPOSAL SYSTEM) Misc Use as directed for sharps disposal 1 each 2   ??? evolocumab (REPATHA SURECLICK) 140 mg/mL PnIj Inject the contents of 1 pen (140 mg) under the skin every fourteen (14) days. 2 mL 3   ??? ferrous gluconate 324 MG tablet Take 324 mg by mouth once.     ??? levETIRAcetam (KEPPRA) 500 MG tablet Take 1 tablet (500 mg total) by mouth Two (2) times a day. 180 tablet 1   ??? levothyroxine (SYNTHROID) 100 MCG tablet Take 100 mcg by mouth.     ??? lisinopriL (PRINIVIL,ZESTRIL) 20 MG tablet Take 1 tablet (20 mg total) by mouth daily. 90 tablet 3   ??? metFORMIN (GLUCOPHAGE) 500 MG tablet Take 1,000 mg by mouth 2 (two) times a day with meals.      ??? pravastatin (PRAVACHOL) 10 MG tablet Take 0.5 tablets (5 mg total) by mouth daily. 15 tablet 3   ??? SUMAtriptan (IMITREX) 100 MG tablet Take one tab at onset of headache, may repeat in 2 hours if headache persists of recurs 9 tablet 5     No current facility-administered medications for this visit.       Allergies/Adverse Events:  Patient has no known allergies.    Lab Results   Component Value Date    Creatinine Whole Blood, POC 0.8 07/10/2016    Creatinine 0.99 (H) 04/23/2020    Creatinine 1.00 (H) 04/09/2020    BUN 20 04/23/2020    BUN 22 04/09/2020    Potassium 5.1 (H) 04/23/2020    Potassium 4.7 (H) 04/09/2020    AST 16 04/09/2020    AST 30 05/26/2018    ALT 7 (L) 04/09/2020    ALT 29 05/26/2018    TSH 0.704 04/09/2020    Total Bilirubin 0.2 (L) 04/09/2020    Total Bilirubin 0.4 05/26/2018    INR 1.03 05/26/2018    INR 1.18 01/20/2017    WBC 4.8 05/26/2018    HGB 13.2 (L) 05/26/2018  HCT 40.1 05/26/2018    Platelet 248 05/26/2018    Triglycerides 170 (H) 08/05/2020    HDL 44 08/05/2020    Non-HDL Cholesterol 68 (L) 08/05/2020    LDL Calculated 34 (L) 08/05/2020    LDL Direct 48.5 08/05/2020         Vital Signs:  There were no vitals filed for this visit.    BP Readings from Last 3 Encounters:   08/05/20 168/74   04/09/20 143/79   02/22/20 128/71     Pulse Readings from Last 3 Encounters:   08/05/20 90   04/09/20 88   02/22/20 93     Wt Readings from Last 3 Encounters:   08/05/20 85.7 kg (189 lb)   04/09/20 85.3 kg (188 lb)   02/22/20 84.3 kg (185 lb 12.8 oz)      BMI Readings from Last 3 Encounters:   08/05/20 31.45 kg/m??   04/09/20 30.34 kg/m??   02/22/20 30.01 kg/m??

## 2020-08-10 NOTE — Unmapped (Signed)
[facility] [id number]  [facility] NEUROLOGY CLINIC [place] [address] [place]  [address]  [place] [address]  [phone]    Date: 08/12/2020  Patient Name: Amber Duncan  MRN: [id number]  PCP: Liam Graham, MD    Assessment:      Ms. Chesterfield is a 69 y.o. female who  has a past medical history of Breast cancer (CMS-HCC), COPD (chronic obstructive pulmonary disease) (CMS-HCC), Coronary artery disease, Diabetes (CMS-HCC), Disease of thyroid gland, Fatigue, Hepatitis, Hepatitis, Hyperlipidemia, Hypertension, Migraine, and Sleep apnea. 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     Cervical level Spinal myoclonus: Doing well currently. No change in medications    Extreme fatigue: This is a much more limiting problem currently. She is waiting to establish care with a new PCP. Extensive workup and treatment have not revealed the cause or alleviated the symptoms of this extreme fatigue, which is impairing her ability to function. I will prescribe a stimulant medication for her now with the expectation that her new primary care doctor will take this over once care is established.          Plan:      Patient Instructions   1. I will prescribe Modafinil 100 mg daily. Please take 1 tab every morning  2. No change to the keppra or the Clonazepam  3. Follow up in 6 months time    Health education/[place] literacy:   Patient education was provided. Reviewed the differential diagnosis, plan of care in details as well as other elements as details above. The benfits and risks of each procedure and medications were discussed with the patient. Alternative options were reviewed with the paient.     Total face ??? to ??? face time included 25 minutes > 50 % in direct consultation reviewing details of history, examination and data findings , discussing my clinical reasoning and clinical impression, as well as a review of my recommendations for a plan of treatment as documented above. Additional time was provided to address alternative options of care, health literacy regarding the differential diagnosis, testing and medication options, the benefits of current plan as well as an opportunity to address all the patient???s questions and concerns. Rehabilitation services, including physical therapy, occupational therapy, speech therapy, independent cardiovascular exercise, and group exercise classes, were considered and discussed with the patient. I spent another 5 minutes in non face to face patient care on the date of this visit.    A written summary was provided to the patient at the time of the visit, as well as instructions on how to access My Chart. My contact information was provided along with instructions on ow to reach the administrative office and the clinic.        Subjective:      HPI: Ms. Amber Duncan is a 69 y.o. Caucasian right handed female who presents for follow up to the [place] [facility] Neurology Outpatient Department for follow up of myoclonus of chest and breathing muscles. Please review my initial clinic visit note dated on 06/17/16 for details regarding history of present illness.    Briefly, Her symptoms began in 2014 without clear inciting event. Initially symptoms were mild and intermittent, but she soon developed quite severe symptoms of breathlessness, and difficulty controlling her voice, as well as the feeling that she was being squeezed in her upper abdomen. She reports that an MRI  of her brain 3 years ago at onset was read as normal. She had an EMG for concern for myasthenia gravis, which was negative. Clonazepam was tried at 0.5 mg qhs, she could not tolerate this and it did not seem to help her symptoms. She had been on Gabapentin for headaches, this did not help headaches or breathing issues so was stopped. She was seen by Dr. Vergie Living in ENT, where examination did not reveal evidence of vocal cord dystonia. He referred her to movement disorders for evaluation. She denies history of exposure to anti-dopaminergic medications. Chest fluoroscopic evaluation 06/18/16 with sniff test revealed no abnormal or paradoxical movements of the chest wall at rest, with deep breathing, during speech, or while sniffing (I was present for this test). MRIc-spine demonstrated DJD, significant at C4-5, without impingement of cord or cord signal abnormality (images reviewed by me, also reviewed together with neuroradiology). Initially, Clonazepam improved her symptoms dramatically. However this stopped being effective, despite escalating dose. I switched her to Keppra late February 2018. This helped initially. She developed worsened symptoms along with generalized malaise, weight loss/nausea, bone pain of unclear etiology summer 2018, she had very extensive medical workup including infectious, endocrine, rheumatological, and neoplastic. I sent tic-borne illness panel which revealed Erlichiosis, we started treatment for this which produced partial improvement of her general symptoms. We weaned off Clonazepam and increased Keppra to 500 mg BID which helped initially, but symptoms returned so we restarted Clonazepam. I offered her methylphenidate 5mg  for her extreme fatigue, but this caused elevated BP so was stopped. I suggested she see her sleep doctor to investigate fatigue further. Had a RCA cardiac stent placed November 2020 and was diagnosed with breast cancer March 2021, underwent surgical lumpectomy 01/2020 and XRT starting late June 2021.    Interim History:  She had her 6 month mammogram Last week and it was good.     Symptoms are stable. She mostly has good days.     In early August, she had a heart problem - felt hot, she was sweaty, went to ER. She was told she had a cardiac event, had ACS rule out and had a chest CT to rule out PE. She had a follow up CT chest October for evaluation of stable lung nodule, and something new was seen, she now has more rapid follow up in April for another CT chest.     Fatigue continues to be a problem. She can sleep until 2pm most days. She can't get out. She wears a fall alert bracelet now. She feels unsteady sometimes, she can stagger backwards, but has not fallen. She is by herself a lot, she had a neighbor who died and was found 3 days later, so she now wears the fall alert bracelet. Last iron infusion was in September, she did not feel her energy levels were better.     Current medications for myoclonus:  Keppra 500 mg BID  Clonazepam 0.5 mg BID    SE: none at this time.    Past Medical Hx:    Past Medical History:   Diagnosis Date   ??? Breast cancer (CMS-HCC)    ??? COPD (chronic obstructive pulmonary disease) (CMS-HCC)    ??? Coronary artery disease    ??? Diabetes (CMS-HCC)    ??? Disease of thyroid gland    ??? Fatigue    ??? Hepatitis    ??? Hepatitis    ??? Hyperlipidemia    ??? Hypertension    ??? Migraine    ???  Sleep apnea        Past Surgical Hx:    Past Surgical History:   Procedure Laterality Date   ??? BACK SURGERY     ??? BREAST BIOPSY     ??? BREAST LUMPECTOMY Left 01/2020   ??? CATARACT EXTRACTION     ??? CHOLECYSTECTOMY     ??? CORONARY STENT PLACEMENT     ??? HYSTERECTOMY     ??? KNEE SURGERY     ??? PR COLONOSCOPY,ABLATE LESION N/A 01/21/2017    Procedure: COLONOSCOPY FLEX; W/ABLAT LES NOT AMENABLE-SNARE;  Surgeon: Luanne Bras, MD;  [facility]: GI PROCEDURES [facility];  Service: Gastroenterology   ??? PR COLSC FLX W/RMVL OF TUMOR POLYP LESION SNARE TQ N/A 01/21/2017    Procedure: COLONOSCOPY FLEX; W/REMOV TUMOR/LES BY SNARE;  Surgeon: Luanne Bras, MD;  Location: [facility];  Service: Gastroenterology   ??? PR MASTECTOMY, PARTIAL Left 01/19/2020    Procedure: MASTECTOMY, PARTIAL (EG, LUMPECTOMY, TYLECTOMY, QUADRANTECTOMY, SEGMENTECTOMY);  Surgeon: Talbert Cage, DO;  Location: MAIN OR [facility];  Service: Surgical Oncology Breast   ??? PR UPPER GI ENDOSCOPY,BIOPSY N/A 04/29/2018    Procedure: UGI ENDOSCOPY; WITH BIOPSY, SINGLE OR MULTIPLE;  Surgeon: Rona Ravens, MD;  [facility]: [facility];  Service: Gastroenterology   ??? RADIATION Left    ??? REDUCTION MAMMAPLASTY         Social Hx:    Social History     Socioeconomic History   ??? Marital status: Divorced     Spouse name: None   ??? Number of children: None   ??? Years of education: None   ??? Highest education level: None   Occupational History   ??? None   Tobacco Use   ??? Smoking status: Former Smoker     Packs/day: 2.00     Years: 48.00     Pack years: 96.00     Types: Cigarettes     Quit date: 11/06/2011     Years since quitting: 8.7   ??? Smokeless tobacco: Never Used   Substance and Sexual Activity   ??? Alcohol use: No   ??? Drug use: No   ??? Sexual activity: None   Other Topics Concern   ??? None   Social History Narrative   ??? None     Social Determinants of Health     Financial Resource Strain: Not on file   Food Insecurity: Not on file   Transportation Needs: Not on file   Physical Activity: Not on file   Stress: Not on file   Social Connections: Not on file       Family Hx:    Family History   Problem Relation Age of Onset   ??? Cancer Brother    ??? Cancer Maternal Aunt    ??? Cancer Maternal Uncle    ??? Diabetes Paternal Uncle    ??? No Known Problems Mother    ??? No Known Problems Father    ??? No Known Problems Sister    ??? No Known Problems Son    ??? No Known Problems Brother    ??? No Known Problems Sister    ??? No Known Problems Sister    ??? No Known Problems Brother    ??? No Known Problems Daughter    ??? No Known Problems Maternal Grandmother    ??? No Known Problems Maternal Grandfather    ??? No Known Problems Paternal Grandmother    ??? No Known Problems Paternal  Grandfather    ??? No Known Problems Other    ??? Heart failure Neg Hx    ??? BRCA 1/2 Neg Hx    ??? Breast cancer Neg Hx    ??? Colon cancer Neg Hx    ??? Endometrial cancer Neg Hx    ??? Ovarian cancer Neg Hx        ALLERGIES:  No Known Allergies    CURRENT MEDICATIONS:    Current Outpatient Medications   Medication Sig Dispense Refill   ??? aspirin-calcium carbonate 81 mg-300 mg calcium(777 mg) Tab Take 81 mg by mouth.     ??? clonazePAM (KLONOPIN) 0.5 MG tablet Take 1 tablet (0.5 mg total) by mouth two (2) times a day as needed for anxiety. 180 tablet 0   ??? docusate sodium (COLACE) 100 MG capsule Take 100 mg by mouth daily.     ??? empty container (SHARPS-A-GATOR DISPOSAL SYSTEM) Misc Use as directed for sharps disposal 1 each 2   ??? evolocumab (REPATHA SURECLICK) 140 mg/mL PnIj Inject the contents of 1 pen (140 mg) under the skin every fourteen (14) days. 2 mL 3   ??? ferrous gluconate 324 MG tablet Take 324 mg by mouth once.     ??? levETIRAcetam (KEPPRA) 500 MG tablet Take 1 tablet (500 mg total) by mouth Two (2) times a day. 180 tablet 1   ??? levothyroxine (SYNTHROID) 100 MCG tablet Take 100 mcg by mouth.     ??? lisinopriL (PRINIVIL,ZESTRIL) 20 MG tablet Take 1 tablet (20 mg total) by mouth daily. 90 tablet 3   ??? metFORMIN (GLUCOPHAGE) 500 MG tablet Take 1,000 mg by mouth 2 (two) times a day with meals.      ??? pravastatin (PRAVACHOL) 10 MG tablet Take 0.5 tablets (5 mg total) by mouth daily. 15 tablet 3   ??? SUMAtriptan (IMITREX) 100 MG tablet Take one tab at onset of headache, may repeat in 2 hours if headache persists of recurs 9 tablet 5   ??? modafiniL (PROVIGIL) 100 MG tablet Take 1 tablet (100 mg total) by mouth daily. 30 tablet 2     No current facility-administered medications for this visit.       Review of Systems:  A 10-systems review was performed and, unless otherwise noted, declared negative by patient.        Objective:   ??  Objective [] Expand by Default     Physical Exam:  Height 5'5'', weight 187 lbs.   ??  Sitting BP 136/64 HR 76  Standing: BP 136/71 HR 89  ??  Neurological Examination:     Motor: symmetric bulk throughout. No fasciculations.     ??  MOVEMENT DISORDERS EXAMINATION:   Facial expression was normal . Volume of speech was normal initially, later in the visit would become low/strained at times particularly at the end of a long sentence.  There was no evidence of tremor. Rapid alternating movements were RAMs: normal in speed,  RAMs: normal in amplitude on the right, and RAMs: normal in speed, RAMs: normal in amplitude on the left.  Foot tap was RAMs: normal on the right and RAMs: normal on the left. She could get up from a low lying chair without help of both hands..  Gait was steady with good heel to toe stride, stride length was normal. Speed was normal.   Arm swing was normal bilaterally.   ??  Her voice starts strong and normal when speaking, but becomes strained and quiet after speaking for a  long time. Initially she could speak normally for a prolonged time, but as the visit progressed, the strained sound would come faster after about 10-15 seconds by the end of the visit. Voice quality improves after brief cessation of speech. While sitting and not speaking, there are no abnormal movements and she is breathing comfortably at rest.    Coordination: FtN without dysmetria or ataxia.     Gait: Normal gait as above      Diagnostic Studies and Review of Records:      Studies reviewed:  MRI C-spine: multilevel DJD, most prominent at C4-5, no cord signal abnormality to my eye, and reviewed with neuroradiologist    Fluoroscopic chest evaluation with sniff test: no abnormal movement of diaphragm or other chest wall structures with rest, normal breathing, deep breathing, speech, or sniffing    Ehrlichia antibodies: 1:256 04/2017 and SAME on repeat 05/2017    ACTH stimulation test 07/2017: normal    Cortisol: normal    TSH: 0.50 (slightly low)

## 2020-08-12 ENCOUNTER — Ambulatory Visit: Admit: 2020-08-12 | Discharge: 2020-08-13 | Payer: PRIVATE HEALTH INSURANCE | Attending: Neurology | Primary: Neurology

## 2020-08-12 DIAGNOSIS — R5382 Chronic fatigue, unspecified: Principal | ICD-10-CM

## 2020-08-12 DIAGNOSIS — J383 Other diseases of vocal cords: Principal | ICD-10-CM

## 2020-08-12 DIAGNOSIS — Z6831 Body mass index (BMI) 31.0-31.9, adult: Principal | ICD-10-CM

## 2020-08-12 MED ORDER — MODAFINIL 100 MG TABLET
ORAL_TABLET | Freq: Every day | ORAL | 2 refills | 30.00000 days | Status: CP
Start: 2020-08-12 — End: 2021-08-12

## 2020-08-12 NOTE — Unmapped (Signed)
1. I will prescribe Modafinil 100 mg daily. Please take 1 tab every morning  2. No change to the keppra or the Clonazepam  3. Follow up in 6 months time

## 2020-08-15 DIAGNOSIS — I2510 Atherosclerotic heart disease of native coronary artery without angina pectoris: Principal | ICD-10-CM

## 2020-08-15 MED ORDER — REPATHA SURECLICK 140 MG/ML SUBCUTANEOUS PEN INJECTOR: 140 mg | mL | 11 refills | 28 days | Status: AC

## 2020-08-15 MED ORDER — REPATHA SURECLICK 140 MG/ML SUBCUTANEOUS PEN INJECTOR
SUBCUTANEOUS | 11 refills | 28.00000 days | Status: CP
Start: 2020-08-15 — End: 2020-08-19

## 2020-08-15 NOTE — Unmapped (Signed)
[facility] Refill Coordination Note    Specialty Medication(s) to be Shipped:   General Specialty: Repatha    Other medication(s) to be shipped: No additional medications requested for fill at this time     Amber Duncan, DOB: 08-02-1951  Phone: [phone] (home)       All above HIPAA information was verified with patient.     Was a Nurse, learning disability used for this call? No    Completed refill call assessment today to schedule patient's medication shipment from the [facility] [phone]).       Specialty medication(s) and dose(s) confirmed: Regimen is correct and unchanged.   Changes to medications: Kem reports no changes at this time.  Changes to insurance: No  Questions for the pharmacist: No    Confirmed patient received Welcome Packet with first shipment. The patient will receive a drug information handout for each medication shipped and additional FDA Medication Guides as required.       DISEASE/MEDICATION-SPECIFIC INFORMATION        For patients on injectable medications: Patient currently has 0 doses left.  Next injection is scheduled for 08/28/20.    SPECIALTY MEDICATION ADHERENCE     Medication Adherence    Patient reported X missed doses in the last month: 0  Specialty Medication: Repatha 140mg /ml  Patient is on additional specialty medications: No  Patient is on more than two specialty medications: No                Repatha 140 mg/ml: 0 days of medicine on hand         SHIPPING     Shipping address confirmed in Epic.     Delivery Scheduled: Yes, Expected medication delivery date: 08/26/20.  However, Rx request for refills was sent to the provider as there are none remaining.     Medication will be delivered via Same Day Courier to the prescription address in Epic WAM.    Nancy Nordmann [facility] Specialty Technician

## 2020-08-19 DIAGNOSIS — I2510 Atherosclerotic heart disease of native coronary artery without angina pectoris: Principal | ICD-10-CM

## 2020-08-19 MED ORDER — REPATHA SURECLICK 140 MG/ML SUBCUTANEOUS PEN INJECTOR
SUBCUTANEOUS | 11 refills | 28 days | Status: CP
Start: 2020-08-19 — End: ?
  Filled 2020-08-26: qty 2, 28d supply, fill #0

## 2020-08-26 MED FILL — REPATHA SURECLICK 140 MG/ML SUBCUTANEOUS PEN INJECTOR: 28 days supply | Qty: 2 | Fill #0 | Status: AC

## 2020-09-03 DIAGNOSIS — R399 Unspecified symptoms and signs involving the genitourinary system: Secondary | ICD-10-CM | POA: Diagnosis not present

## 2020-09-09 DIAGNOSIS — E119 Type 2 diabetes mellitus without complications: Secondary | ICD-10-CM | POA: Diagnosis not present

## 2020-09-13 NOTE — Unmapped (Addendum)
[facility] Refill Coordination Note    Specialty Medication(s) to be Shipped:   General Specialty: Repatha    Other medication(s) to be shipped: No additional medications requested for fill at this time     Amber Duncan, DOB: May 10, 1951  Phone: [phone] (home)       All above HIPAA information was verified with patient.     Was a Nurse, learning disability used for this call? No    Completed refill call assessment today to schedule patient's medication shipment from the [facility] [phone]).       Specialty medication(s) and dose(s) confirmed: Regimen is correct and unchanged.   Changes to medications: Tristine reports no changes at this time.  Changes to insurance: No  Questions for the pharmacist: No    Confirmed patient received Welcome Packet with first shipment. The patient will receive a drug information handout for each medication shipped and additional FDA Medication Guides as required.       DISEASE/MEDICATION-SPECIFIC INFORMATION        For patients on injectable medications: Patient currently has 0 doses left.  Next injection is scheduled for 09/25/20.    SPECIALTY MEDICATION ADHERENCE     Medication Adherence    Patient reported X missed doses in the last month: 0  Specialty Medication: Repatha 140mg /ml  Patient is on additional specialty medications: No  Patient is on more than two specialty medications: No                Repatha 140 mg/ml: 0 days of medicine on hand          SHIPPING     Shipping address confirmed in Epic.     Delivery Scheduled: Yes, Expected medication delivery date: 09/24/20.     Medication will be delivered via Same Day Courier to the prescription address in Epic WAM.    Nancy Nordmann [facility] Specialty Technician

## 2020-09-20 NOTE — Unmapped (Signed)
Refill request for Clonazepam 0.5mg   Last visit: 08/12/20  Next visit: 03/03/21  Send to [facility] in [place] file.

## 2020-09-24 MED ORDER — CLONAZEPAM 0.5 MG TABLET
ORAL_TABLET | Freq: Two times a day (BID) | ORAL | 0 refills | 90 days | Status: CP | PRN
Start: 2020-09-24 — End: 2020-12-23

## 2020-09-24 MED FILL — REPATHA SURECLICK 140 MG/ML SUBCUTANEOUS PEN INJECTOR: SUBCUTANEOUS | 28 days supply | Qty: 2 | Fill #1

## 2020-09-25 MED ORDER — CLONAZEPAM 0.5 MG TABLET
ORAL_TABLET | 0 refills | 0 days
Start: 2020-09-25 — End: ?

## 2020-10-11 ENCOUNTER — Ambulatory Visit: Admit: 2020-10-11 | Discharge: 2020-10-12 | Payer: PRIVATE HEALTH INSURANCE | Attending: Family | Primary: Family

## 2020-10-11 DIAGNOSIS — N390 Urinary tract infection, site not specified: Principal | ICD-10-CM

## 2020-10-11 DIAGNOSIS — D649 Anemia, unspecified: Principal | ICD-10-CM

## 2020-10-11 DIAGNOSIS — Z780 Asymptomatic menopausal state: Principal | ICD-10-CM

## 2020-10-11 DIAGNOSIS — R2232 Localized swelling, mass and lump, left upper limb: Principal | ICD-10-CM

## 2020-10-11 DIAGNOSIS — E119 Type 2 diabetes mellitus without complications: Principal | ICD-10-CM

## 2020-10-11 DIAGNOSIS — R300 Dysuria: Principal | ICD-10-CM

## 2020-10-11 DIAGNOSIS — Z6830 Body mass index (BMI) 30.0-30.9, adult: Secondary | ICD-10-CM | POA: Diagnosis not present

## 2020-10-11 LAB — ALBUMIN / CREATININE URINE RATIO
ALBUMIN QUANT URINE: 2.5 mg/dL
ALBUMIN/CREATININE RATIO: 24.3 ug/mg (ref 0.0–30.0)
CREATININE, URINE: 103 mg/dL

## 2020-10-11 MED ORDER — NITROFURANTOIN MONOHYDRATE/MACROCRYSTALS 100 MG CAPSULE
ORAL_CAPSULE | Freq: Two times a day (BID) | ORAL | 0 refills | 10.00000 days | Status: CP
Start: 2020-10-11 — End: ?

## 2020-10-11 MED ORDER — METFORMIN 500 MG TABLET
ORAL_TABLET | Freq: Two times a day (BID) | ORAL | 0 refills | 45 days | Status: CP
Start: 2020-10-11 — End: ?

## 2020-10-11 NOTE — Unmapped (Signed)
Assessment and Plan:     Amber Duncan was seen today for establish care, dysuria, arm pain, weakness and fatigue.    Diagnoses and all orders for this visit:    Diabetes mellitus without complication (CMS-HCC)  HGB A1c 5.4  DM well controlled. Continue metformin 1000 mg BID.  -     POCT glycosylated hemoglobin (Hb A1C); Future  -     Albumin/creatinine urine ratio; Future  -     Albumin/creatinine urine ratio  -     POCT glycosylated hemoglobin (Hb A1C)  -     metFORMIN (GLUCOPHAGE) 500 MG tablet; Take 2 tablets (1,000 mg total) by mouth 2 (two) times a day with meals.    Dysuria  UA (+) nitrates, blood, protein, leukocytes.    -     POCT urinalysis dipstick; Future  -     POCT urinalysis dipstick  -     nitrofurantoin, macrocrystal-monohydrate, (MACROBID) 100 MG capsule; Take 1 capsule (100 mg total) by mouth Two (2) times a day.    Chronic UTI  Treat with Macrobid BID for 10 days.   Will obtain urine culture and adjust treatment as necessary.   -     Urine Culture; Future  -     Urine Culture  -     nitrofurantoin, macrocrystal-monohydrate, (MACROBID) 100 MG capsule; Take 1 capsule (100 mg total) by mouth Two (2) times a day.    Anemia, unspecified type  HGB 12.1, wnl.   -     POCT hemoglobin    Mass of left upper extremity  Moderate concern for malignancy given patient's history of left breast cancer and lung nodules.   -     [place] Miscellaneous Soft Tissue; Future     Post-menopause  -     XR Dexa Bone Density Skeletal; Future    Other orders  -     Health Maintenance Outside Records Request; Future    Return in about 3 months (around 01/08/2021) for 3 month f/up, transition to Dr. Vinson Moselle due to medical complexity .    HPI:      Amber Duncan  is here to establish care. Previously seen at [facility] by Dr. Letitia Libra. Patient explains she was not satisfied with her care. PMH includes: angiodysplasia of intestine, IBS with diarrhea, DM, hypothyroidism, allergic rhinitis, pulmonary emphysema, OSA on CPAP, CAD, HTN, dysphonia spastica, anemia, bilateral occipital neuralgia, cephalalgia, mixed hyperlipidemia, IDA, multiple pulmonary nodules, obesity, voice resonance disorder, propriospinal myoclonus, chronic fatigue, breast cancer.    She wishes to discuss the following concerns.     PMH includes breast cancer. S/p left partial mastectomy 01/19/20. She is following closely with Surg Onc, last visit 08/05/20.     Patient endorses PMH of chronic UTI. Symptoms have been significantly worse since 06/2020. She has been treated with several courses of abx over the past 4 months. Today, she reports dysuria, and urinary urgency with little output. Therapies tried: Azo.    She reports evaluation by urology and normal cystoscope ~1 year ago.     She reports weakness and fatigue. PMH includes anemia. She has received iron infusions in the past.   She wears medical alert bracelet.     Pain: Patient presents with pain located in left arm. Symptoms started >8 months ago. Pain will wake her up at night. Recent injury? No. Related symptoms include three lumps in left arm - 2 in upper arm that are tender, and 1 non-tender egg shaped lump  near her elbow. She reports symptoms were present prior to breast cancer dx.     Patient endorses hx of long term tobacco use. She is currently enrolled in study for annual CT chest lung cancer screening. She notes last CT chest in 06/2020 revealed abnormality. Per result,   IMPRESSION:   1. Lung-RADS 3, probably benign findings. Short-term follow-up in 6   months is recommended with repeat low-dose chest CT without contrast (please use the following order, CT CHEST LCS NODULE FOLLOW-UP W/O CM). New left upper lobe pleural-based density of volume derived equivalent diameter 5.9 mm.   2. Aortic Atherosclerosis (ICD10-I70.0) and Emphysema (ICD10-J43.9). Coronary artery atherosclerosis.   3. Bilateral adrenal adenomas.      Diabetes: Current symptoms include: frequent UTIs. Symptoms have been well-controlled. Patient denies foot ulcerations, hyperglycemia, hypoglycemia , increase appetite, nausea, paresthesia of the feet, polydipsia, visual disturbances, vomitting and weight loss. Evaluation to date has included: hemoglobin A1C. Current treatment: Continued metformin which has been effective.  Doing regular exercise: no.     PHQ-2 Score: 0  Screening complete, no depression identified / no further action needed today     ROS:      Comprehensive 10 point ROS negative unless otherwise stated in the HPI.      [facility] Components:     Medication adherence and barriers to the treatment plan have been addressed. Opportunities to optimize healthy behaviors have been discussed. Patient / caregiver voiced understanding.    Past Medical/Surgical History:     Past Medical History:   Diagnosis Date   ??? Breast cancer (CMS-HCC)    ??? Chronic UTI    ??? COPD (chronic obstructive pulmonary disease) (CMS-HCC)    ??? Coronary artery disease    ??? Diabetes (CMS-HCC)    ??? Disease of thyroid gland    ??? Fatigue    ??? Hepatitis    ??? Hepatitis    ??? Hyperlipidemia    ??? Hypertension    ??? Migraine    ??? Sleep apnea      Past Surgical History:   Procedure Laterality Date   ??? BACK SURGERY     ??? BREAST BIOPSY     ??? BREAST LUMPECTOMY Left 01/2020   ??? CATARACT EXTRACTION     ??? CHOLECYSTECTOMY     ??? CORONARY STENT PLACEMENT     ??? HYSTERECTOMY     ??? KNEE SURGERY     ??? PR COLONOSCOPY,ABLATE LESION N/A 01/21/2017    Procedure: COLONOSCOPY FLEX; W/ABLAT LES NOT AMENABLE-SNARE;  Surgeon: Luanne Bras, MD;  [facility]: GI PROCEDURES [facility];  Service: Gastroenterology   ??? PR COLSC FLX W/RMVL OF TUMOR POLYP LESION SNARE TQ N/A 01/21/2017    Procedure: COLONOSCOPY FLEX; W/REMOV TUMOR/LES BY SNARE;  Surgeon: Luanne Bras, MD;  Location: [facility] PROCEDURES [facility];  Service: Gastroenterology   ??? PR MASTECTOMY, PARTIAL Left 01/19/2020    Procedure: MASTECTOMY, PARTIAL (EG, LUMPECTOMY, TYLECTOMY, QUADRANTECTOMY, SEGMENTECTOMY); Surgeon: Talbert Cage, DO;  Location: MAIN OR [facility];  Service: Surgical Oncology Breast   ??? PR UPPER GI ENDOSCOPY,BIOPSY N/A 04/29/2018    Procedure: UGI ENDOSCOPY; WITH BIOPSY, SINGLE OR MULTIPLE;  Surgeon: Rona Ravens, MD;  [facility];  Service: Gastroenterology   ??? RADIATION Left    ??? REDUCTION MAMMAPLASTY         Family History:     Family History   Problem Relation Age of Onset   ??? Cancer Brother  liver   ??? Cancer Maternal Aunt    ??? Cancer Maternal Uncle    ??? Diabetes Paternal Uncle    ??? Other Mother         MVA   ??? Other Father         MVA   ??? No Known Problems Sister    ??? No Known Problems Son    ??? Cancer Brother         Lung   ??? Other Sister         MVA   ??? Other Sister         MVA drunk driver   ??? No Known Problems Brother    ??? No Known Problems Daughter    ??? No Known Problems Maternal Grandmother    ??? No Known Problems Maternal Grandfather    ??? No Known Problems Paternal Grandmother    ??? No Known Problems Paternal Grandfather    ??? No Known Problems Other    ??? Heart failure Neg Hx    ??? BRCA 1/2 Neg Hx    ??? Breast cancer Neg Hx    ??? Colon cancer Neg Hx    ??? Endometrial cancer Neg Hx    ??? Ovarian cancer Neg Hx        Social History:     Social History     Tobacco Use   ??? Smoking status: Former Smoker     Packs/day: 2.00     Years: 48.00     Pack years: 96.00     Types: Cigarettes     Quit date: 11/06/2011     Years since quitting: 8.9   ??? Smokeless tobacco: Never Used   Vaping Use   ??? Vaping Use: Never used   Substance Use Topics   ??? Alcohol use: No   ??? Drug use: No       Allergies:     Patient has no known allergies.    Current Medications:     Current Outpatient Medications   Medication Sig Dispense Refill   ??? aspirin-calcium carbonate 81 mg-300 mg calcium(777 mg) Tab Take 81 mg by mouth.     ??? clonazePAM (KLONOPIN) 0.5 MG tablet Take 1 tablet (0.5 mg total) by mouth two (2) times a day as needed for anxiety. 180 tablet 0   ??? docusate sodium (COLACE) 100 MG capsule Take 100 mg by mouth daily.     ??? evolocumab (REPATHA SURECLICK) 140 mg/mL PnIj Inject the contents of 1 pen (140 mg) under the skin every fourteen (14) days. 2 mL 11   ??? ferrous gluconate 324 MG tablet Take 324 mg by mouth once.     ??? levETIRAcetam (KEPPRA) 500 MG tablet Take 1 tablet (500 mg total) by mouth Two (2) times a day. 180 tablet 1   ??? levothyroxine (SYNTHROID) 100 MCG tablet Take 100 mcg by mouth daily.      ??? lisinopriL (PRINIVIL,ZESTRIL) 20 MG tablet Take 1 tablet (20 mg total) by mouth daily. 90 tablet 3   ??? metFORMIN (GLUCOPHAGE) 500 MG tablet Take 2 tablets (1,000 mg total) by mouth 2 (two) times a day with meals. 180 tablet 0   ??? pravastatin (PRAVACHOL) 10 MG tablet Take 0.5 tablets (5 mg total) by mouth daily. 15 tablet 3   ??? SUMAtriptan (IMITREX) 100 MG tablet Take one tab at onset of headache, may repeat in 2 hours if headache persists of recurs 9 tablet 5   ??? empty container (  SHARPS-A-GATOR DISPOSAL SYSTEM) Misc Use as directed for sharps disposal 1 each 2   ??? nitrofurantoin, macrocrystal-monohydrate, (MACROBID) 100 MG capsule Take 1 capsule (100 mg total) by mouth Two (2) times a day. 20 capsule 0     No current facility-administered medications for this visit.       Health Maintenance:     Health Maintenance   Topic Date Due   ??? Retinal Eye Exam  Never done   ??? Urine Albumin/Creatinine Ratio  Never done   ??? DTaP/Tdap/Td Vaccines (1 - Tdap) Never done   ??? Zoster Vaccines (2 of 3) 06/22/2011   ??? DEXA Scan-Start Age 42  Never done   ??? Foot Exam  08/13/2018   ??? COVID-19 Vaccine (4 - Booster for Pfizer series) 11/28/2020   ??? Hemoglobin A1c  04/10/2021   ??? Serum Creatinine Monitoring  04/23/2021   ??? Potassium Monitoring  04/23/2021   ??? Mammogram Start Age 69  08/05/2022   ??? COPD Spirometry  06/03/2023   ??? Colonoscopy  11/04/2029   ??? Hepatitis C Screen  Completed   ??? Pneumococcal Vaccines  Completed   ??? Influenza Vaccine  Completed       Immunizations:     Immunization History   Administered Date(s) Administered   ??? COVID-19 VACC,MRNA,(PFIZER)(PF)(IM) 10/20/2019, 11/10/2019, 05/31/2020   ??? INFLUENZA TIV (TRI) 83MO+ W/ PRESERV (IM) 05/26/2013, 06/10/2014   ??? INFLUENZA TIV (TRI) PF (IM) 05/23/2011, 05/30/2012   ??? Influenza Virus Vaccine, unspecified formulation 06/18/2015, 06/14/2017, 05/17/2020   ??? Influenza Whole 06/07/2012   ??? Influenza, High Dose (IIV4) 65 yrs & older 06/02/2016, 05/24/2018, 04/27/2019   ??? PNEUMOCOCCAL POLYSACCHARIDE 23 09/08/2007, 02/21/2016, 04/27/2019   ??? Pneumococcal Conjugate 13-Valent 09/10/2014, 02/09/2018   ??? ZOSTAVAX - ZOSTER VACCINE, LIVE, SQ 04/27/2011     I have reviewed and (if needed) updated the patient's problem list, medications, allergies, past medical and surgical history, social and family history.     Vital Signs:     Wt Readings from Last 3 Encounters:   10/11/20 81.2 kg (179 lb)   08/12/20 84.8 kg (187 lb)   08/05/20 85.7 kg (189 lb)     Temp Readings from Last 3 Encounters:   10/11/20 36.5 ??C (97.7 ??F) (Oral)   08/05/20 35.9 ??C (96.7 ??F) (Temporal)   02/12/20 36.6 ??C (97.8 ??F) (Temporal)     BP Readings from Last 3 Encounters:   10/11/20 132/70   08/05/20 168/74   04/09/20 143/79     Pulse Readings from Last 3 Encounters:   10/11/20 99   08/05/20 90   04/09/20 88     Estimated body mass index is 30.25 kg/m?? as calculated from the following:    Height as of this encounter: 163.8 cm (5' 4.5).    Weight as of this encounter: 81.2 kg (179 lb).  Facility age limit for growth percentiles is 20 years.      Objective:      General: Alert and oriented x3. Well-appearing. No acute distress.   HEENT:  Normocephalic.  Atraumatic. Conjunctiva and sclera normal. OP MMM without lesions.   Neck:  Supple. No thyroid enlargement. No adenopathy.   Heart:  Regular rate and rhythm . Normal S1, S2.  No murmurs, rubs or gallops.   Lungs:  No respiratory distress.  Lungs clear to auscultation. No wheezes, rhonchi, or rales.   GI/GU:  Soft, +BS, nondistended, non-TTP.   Extremities: No edema. Peripheral pulses normal. Small lipoma to left medial elbow. 6  cm firm subcutaneous palpable mass to left upper arm, moderately TTP. Smaller, 3 cm firm subcutaneous mass, inferior to larger mass.   Skin:  Warm, dry. No rash or lesions present.   Neuro:  Non-focal. No obvious weakness.   Psych:  Affect normal, eye contact good, speech clear and coherent.       I attest that I, Bayard Hugger, personally documented this note while acting as scribe for Noralyn Pick, FNP.      Bayard Hugger, Scribe.  10/11/2020     The documentation recorded by the scribe accurately reflects the service I personally performed and the decisions made by me.    Noralyn Pick, FNP

## 2020-10-11 NOTE — Unmapped (Addendum)
WelPatient Education        Urinary Tract Infection (UTI) in Women: Care Instructions  Overview     A urinary tract infection, or UTI, is a general term for an infection anywhere between the kidneys and the urethra (where urine comes out). Most UTIs are bladder infections. They often cause pain or burning when you urinate.  UTIs are caused by bacteria and can be cured with antibiotics. Be sure to complete your treatment so that the infection does not get worse.  Follow-up care is a key part of your treatment and safety. Be sure to make and go to all appointments, and call your doctor if you are having problems. It's also a good idea to know your test results and keep a list of the medicines you take.  How can you care for yourself at home?  ?? Take your antibiotics as directed. Do not stop taking them just because you feel better. You need to take the full course of antibiotics.  ?? Drink extra water and other fluids for the next day or two. This will help make the urine less concentrated and help wash out the bacteria that are causing the infection. (If you have kidney, heart, or liver disease and have to limit fluids, talk with your doctor before you increase the amount of fluids you drink.)  ?? Avoid drinks that are carbonated or have caffeine. They can irritate the bladder.  ?? Urinate often. Try to empty your bladder each time.  ?? To relieve pain, take a hot bath or lay a heating pad set on low over your lower belly or genital area. Never go to sleep with a heating pad in place.  To prevent UTIs  ?? Drink plenty of water each day. This helps you urinate often, which clears bacteria from your system. (If you have kidney, heart, or liver disease and have to limit fluids, talk with your doctor before you increase the amount of fluids you drink.)  ?? Urinate when you need to.  ?? If you are sexually active, urinate right after you have sex.  ?? Change sanitary pads often.  ?? Avoid douches, bubble baths, feminine hygiene sprays, and other feminine hygiene products that have deodorants.  ?? After going to the bathroom, wipe from front to back.  When should you call for help?   Call your doctor now or seek immediate medical care if:  ?? ?? Symptoms such as fever, chills, nausea, or vomiting get worse or appear for the first time.   ?? ?? You have new pain in your back just below your rib cage. This is called flank pain.   ?? ?? There is new blood or pus in your urine.   ?? ?? You have any problems with your antibiotic medicine.   Watch closely for changes in your health, and be sure to contact your doctor if:  ?? ?? You are not getting better after taking an antibiotic for 2 days.   ?? ?? Your symptoms go away but then come back.   Where can you learn more?  Go to [facility] at [email or web address] forces logistics/support/administrative officer in the Menu. Enter [id number] in the search box to learn more about Urinary Tract Infection (UTI) in Women: Care Instructions.  Current as of: October 18, 2019??????????????????????????????Content Version: 13.1  ?? 2006-2021 [employer].   Care instructions adapted under license by [facility]. If you have questions about a medical condition or this instruction, always ask your healthcare  professional. [employer] disclaims any warranty or liability for your use of this information.       come to [facility] at [facility]!  [address] [place], [place] [address]  (P): [phone] (F): [phone]    [name], MD  Etheleen Nicks, FNP    Flossie Buffy, DNP Ali Lowe, FNP     We are honored that you have chosen [place] as your health care provider and want to do everything possible to make your experience with [place] a positive one. Our goal is to provide the highest quality care for all of our patients in a timely and respectful manner. Please take a moment to review the handout below, as it includes important clinic information as well as details on the resources available to you.     Telephone Calls  You may contact the clinic via phone [phone]) during office hours.   Office Hours: Monday ??? Friday 8:00 a.m. to 5:00 p.m.  Phone Tree Tips:   Option 1: Patient Appointments   Option 2: Provider/Calling from a Provider's Office   Option 3*: Refills, Labs, or Clinical Questions   Option 4: Billing Questions     *By selecting option 3, the following options will be prompted:  Option 1 - Dr. Kenton Kingfisher Clinical Assistant  Option 2 - Rosey Bath Clapp's Clinical Assistant  Option 3 - Keri MacDonald's Clinical Assistant  Option 4 - Dr. Simonne Martinet Clinical Assistant  Option 5 - Medication Refills and/or Speak with Clinic Nurse    Please allow 1-3 business days for routine medication refills, 5-7 business days for forms and paperwork, and 24-48 hours for a returned phone call.     *TIP: Cut this out and save for future reference!     My [place] Chart  My [place] Chart is a safe, secure and convenient patient portal that allows you to manage and receive information about your health.   My [place] Chart may be used to:   ??? Request medical appointments   ??? View your health summary*   ??? View test results*   NOTE: Your laboratory and other results may be visible to you in real time, possibly before they reach your provider. Please allow 48 hours for clinical interpretation of these results. Importantly, even if a result is flagged as abnormal, it may not be one that impacts your health.  ??? Make payments for services*   ??? Request prescription renewals*   ??? Access trusted health information resources   ??? Connect with your providers to ask a question about a non-urgent need*   ??? Complete your medical history and questionnaires prior to your appointment  *Also accessible on the MyChart app available for iOS and Android.    My [place] Chart should NOT be used for urgent medical concerns (requiring reply sooner than 3 business days), long discussions, or new medical issues. If you have a new issue or multiple concerns please call and make and appointment or schedule on the portal.     Please allow 1-3 business days for response to all MyChart messages.     Need to activate your MyChart? Follow the instructions on the first page of this packet or go to [email or web address] and follow the directions.     Additional Information  If you have questions, you can call [facility] 24 hours a day, 7 days a week at [phone].     Once again, welcome to our practice and thank you for choosing [facility] at  [place] for all your health care needs.

## 2020-10-15 NOTE — Unmapped (Signed)
[facility] Clinical Assessment & Refill Coordination Note    Amber Duncan, DOB: 05/15/1951  Phone: [phone] (home)     All above HIPAA information was verified with patient.     Was a Nurse, learning disability used for this call? No    Specialty Medication(s):   General Specialty: Repatha     Current Outpatient Medications   Medication Sig Dispense Refill   ??? aspirin-calcium carbonate 81 mg-300 mg calcium(777 mg) Tab Take 81 mg by mouth.     ??? clonazePAM (KLONOPIN) 0.5 MG tablet Take 1 tablet (0.5 mg total) by mouth two (2) times a day as needed for anxiety. 180 tablet 0   ??? docusate sodium (COLACE) 100 MG capsule Take 100 mg by mouth daily.     ??? empty container (SHARPS-A-GATOR DISPOSAL SYSTEM) Misc Use as directed for sharps disposal 1 each 2   ??? evolocumab (REPATHA SURECLICK) 140 mg/mL PnIj Inject the contents of 1 pen (140 mg) under the skin every fourteen (14) days. 2 mL 11   ??? ferrous gluconate 324 MG tablet Take 324 mg by mouth once.     ??? levETIRAcetam (KEPPRA) 500 MG tablet Take 1 tablet (500 mg total) by mouth Two (2) times a day. 180 tablet 1   ??? levothyroxine (SYNTHROID) 100 MCG tablet Take 100 mcg by mouth daily.      ??? lisinopriL (PRINIVIL,ZESTRIL) 20 MG tablet Take 1 tablet (20 mg total) by mouth daily. 90 tablet 3   ??? metFORMIN (GLUCOPHAGE) 500 MG tablet Take 2 tablets (1,000 mg total) by mouth 2 (two) times a day with meals. 180 tablet 0   ??? nitrofurantoin, macrocrystal-monohydrate, (MACROBID) 100 MG capsule Take 1 capsule (100 mg total) by mouth Two (2) times a day. 20 capsule 0   ??? pravastatin (PRAVACHOL) 10 MG tablet Take 0.5 tablets (5 mg total) by mouth daily. 15 tablet 3   ??? SUMAtriptan (IMITREX) 100 MG tablet Take one tab at onset of headache, may repeat in 2 hours if headache persists of recurs 9 tablet 5     No current facility-administered medications for this visit.        Changes to medications: [name] reports she has been on several antibiotics in the past few months due to a chronic UTI. She cannot recalls the names of them.    No Known Allergies    Changes to allergies: No    SPECIALTY MEDICATION ADHERENCE     Repatha 140 mg/ml: 0 days of medicine on hand       Medication Adherence    Patient reported X missed doses in the last month: 0  Specialty Medication: Repatha 140 mg/mL  Informant: patient          Specialty medication(s) dose(s) confirmed: Regimen is correct and unchanged.     Are there any concerns with adherence? No    Adherence counseling provided? Not needed    CLINICAL MANAGEMENT AND INTERVENTION      Clinical Benefit Assessment:    Do you feel the medicine is effective or helping your condition? Yes    Clinical Benefit counseling provided? Progress note from 08/06/20 shows evidence of clinical benefit    Adverse Effects Assessment:    Are you experiencing any side effects? No    Are you experiencing difficulty administering your medicine? No    Quality of Life Assessment:    How many days over the past month did your high cholesterol  keep you from your normal  activities? For example, brushing your teeth or getting up in the morning. Patient declined to answer    Have you discussed this with your provider? Not needed    Therapy Appropriateness:    Is therapy appropriate? Yes, therapy is appropriate and should be continued    DISEASE/MEDICATION-SPECIFIC INFORMATION      For patients on injectable medications: Patient currently has 0 doses left.  Next injection is scheduled for 10/23/20.    PATIENT SPECIFIC NEEDS     - Does the patient have any physical, cognitive, or cultural barriers? No    - Is the patient high risk? No    - Does the patient require a Care Management Plan? No     - Does the patient require physician intervention or other additional services (i.e. nutrition, smoking cessation, social work)? No      SHIPPING     Specialty Medication(s) to be Shipped:   General Specialty: Repatha    Other medication(s) to be shipped: No additional medications requested for fill at this time     Changes to insurance: No    Delivery Scheduled: Yes, Expected medication delivery date: 10/22/20.     Medication will be delivered via Same Day Courier to the confirmed prescription address in [facility].    The patient will receive a drug information handout for each medication shipped and additional FDA Medication Guides as required.  Verified that patient has previously received a Conservation officer, historic buildings.    All of the patient's questions and concerns have been addressed.    [name]   [facility] Specialty Pharmacist

## 2020-10-22 MED FILL — REPATHA SURECLICK 140 MG/ML SUBCUTANEOUS PEN INJECTOR: SUBCUTANEOUS | 28 days supply | Qty: 2 | Fill #2

## 2020-10-29 DIAGNOSIS — R2232 Localized swelling, mass and lump, left upper limb: Principal | ICD-10-CM

## 2020-10-29 NOTE — Unmapped (Signed)
Order is in her chart.  She has been out of town.  She will call [facility] at [phone] to schedule.

## 2020-10-29 NOTE — Unmapped (Signed)
Pt. Is anxious and stating needing ultrasound order put in from [facility]

## 2020-10-30 DIAGNOSIS — R2232 Localized swelling, mass and lump, left upper limb: Principal | ICD-10-CM

## 2020-10-30 NOTE — Unmapped (Signed)
resubmitted [place] order

## 2020-10-30 NOTE — Unmapped (Signed)
Patient called regarding her ultra sound. Patient states she called to schedule and the order is incorrect.

## 2020-10-30 NOTE — Unmapped (Signed)
Patient called in asking a question about her referral for vascular/ultrasound referral. The questions she is asking me sounds clinical so I advised patient I would have a nurse give her a call. Please advise [phone]

## 2020-11-08 ENCOUNTER — Other Ambulatory Visit: Admit: 2020-11-08 | Discharge: 2020-11-09 | Payer: PRIVATE HEALTH INSURANCE

## 2020-11-08 DIAGNOSIS — N3000 Acute cystitis without hematuria: Principal | ICD-10-CM

## 2020-11-08 DIAGNOSIS — R300 Dysuria: Principal | ICD-10-CM

## 2020-11-08 MED ORDER — NITROFURANTOIN MONOHYDRATE/MACROCRYSTALS 100 MG CAPSULE
ORAL_CAPSULE | Freq: Two times a day (BID) | ORAL | 0 refills | 10 days | Status: CP
Start: 2020-11-08 — End: ?

## 2020-11-08 NOTE — Unmapped (Signed)
Dropped a urine off earlier today.  Wants results.C/O dysuria, frequency, and low abd pain.  Recent UTI and finished ABX.      Please send ABX to [facility] in [place].

## 2020-11-09 NOTE — Unmapped (Signed)
Amber Duncan aware she does have infection and Amber Duncan will send in ABX.  We will also do a Urine CX.

## 2020-11-09 NOTE — Unmapped (Signed)
+   nitrites on UA. Patient symptomatic. Urine sent for cx. Rx for macrobid x 10 days sent to pharmacy.

## 2020-11-12 NOTE — Unmapped (Signed)
[facility] Refill Coordination Note    Specialty Medication(s) to be Shipped:   General Specialty: Repatha    Other medication(s) to be shipped: No additional medications requested for fill at this time     Amber Duncan, DOB: February 22, 1951  Phone: [phone] (home)       All above HIPAA information was verified with patient.     Was a Nurse, learning disability used for this call? No    Completed refill call assessment today to schedule patient's medication shipment from the [facility] [phone]).       Specialty medication(s) and dose(s) confirmed: Regimen is correct and unchanged.   Changes to medications: Amber Duncan reports no changes at this time.  Changes to insurance: No  Questions for the pharmacist: No    Confirmed patient received Welcome Packet with first shipment. The patient will receive a drug information handout for each medication shipped and additional FDA Medication Guides as required.       DISEASE/MEDICATION-SPECIFIC INFORMATION        For patients on injectable medications: Patient currently has 0 doses left.  Next injection is scheduled for 11/20/20.    SPECIALTY MEDICATION ADHERENCE     Medication Adherence    Patient reported X missed doses in the last month: 0  Specialty Medication: Repatha 140mg /ml  Patient is on additional specialty medications: No  Patient is on more than two specialty medications: No                Repatha 140 mg/ml: 0 days of medicine on hand         SHIPPING     Shipping address confirmed in Epic.     Delivery Scheduled: Yes, Expected medication delivery date: 11/18/20.     Medication will be delivered via Same Day Courier to the prescription address in Epic [facility].    Amber Duncan [facility] Specialty Technician

## 2020-11-17 ENCOUNTER — Telehealth: Payer: Self-pay | Admitting: *Deleted

## 2020-11-17 NOTE — Telephone Encounter (Signed)
Informed patient that it is time to schedule her 6 month lung screening follow up CT scan. Patient is a former smoker.  She stopped smoking cigarettes in 2013. Appointment scheduled for 12/18/2020 at 11:45am.

## 2020-11-18 ENCOUNTER — Ambulatory Visit: Admit: 2020-11-18 | Discharge: 2020-11-19 | Payer: PRIVATE HEALTH INSURANCE

## 2020-11-18 ENCOUNTER — Other Ambulatory Visit: Payer: Self-pay | Admitting: *Deleted

## 2020-11-18 DIAGNOSIS — E669 Obesity, unspecified: Principal | ICD-10-CM

## 2020-11-18 DIAGNOSIS — I10 Essential (primary) hypertension: Principal | ICD-10-CM

## 2020-11-18 DIAGNOSIS — E782 Mixed hyperlipidemia: Principal | ICD-10-CM

## 2020-11-18 DIAGNOSIS — Z780 Asymptomatic menopausal state: Secondary | ICD-10-CM | POA: Diagnosis not present

## 2020-11-18 DIAGNOSIS — Z87891 Personal history of nicotine dependence: Secondary | ICD-10-CM

## 2020-11-18 DIAGNOSIS — M85852 Other specified disorders of bone density and structure, left thigh: Secondary | ICD-10-CM | POA: Diagnosis not present

## 2020-11-18 DIAGNOSIS — N951 Menopausal and female climacteric states: Secondary | ICD-10-CM | POA: Diagnosis not present

## 2020-11-18 DIAGNOSIS — R918 Other nonspecific abnormal finding of lung field: Secondary | ICD-10-CM

## 2020-11-18 DIAGNOSIS — Z13820 Encounter for screening for osteoporosis: Secondary | ICD-10-CM | POA: Diagnosis not present

## 2020-11-18 MED FILL — REPATHA SURECLICK 140 MG/ML SUBCUTANEOUS PEN INJECTOR: SUBCUTANEOUS | 28 days supply | Qty: 2 | Fill #3

## 2020-11-18 NOTE — Unmapped (Cosign Needed)
Nutrition IBT Consult Note    Referring Provider:  Loran Senters, *    Reason for Referral:  Nutrition Counseling    Patient Presents For:  Dyslipidemia / Hyperlipidemia / Hypertriglyceridemia: Patient already taking aspirin    ASSESS -   Medical History:  Patient Active Problem List   Diagnosis   ??? Allergic rhinitis   ??? Anemia   ??? Coronary artery disease involving native coronary artery of native heart without angina pectoris   ??? Bilateral occipital neuralgia   ??? Cephalalgia   ??? Pulmonary emphysema (CMS-HCC)   ??? Diabetes mellitus without complication (CMS-HCC)   ??? Dyspnea   ??? Dysphonia spastica   ??? Essential hypertension   ??? Hoarseness or changing voice   ??? Mixed hyperlipidemia   ??? Iron deficiency anemia   ??? Multiple pulmonary nodules   ??? Obesity   ??? Hypothyroidism   ??? OSA on CPAP   ??? Voice resonance disorder   ??? Spasm of diaphragm   ??? Propriospinal myoclonus   ??? Angiodysplasia of intestine   ??? Weight loss   ??? Chronic fatigue   ??? Irritable bowel syndrome with diarrhea   ??? Malignant neoplasm of lower-inner quadrant of left breast in female, estrogen receptor positive (CMS-HCC)   ??? Chronic UTI       Present height 163.8 cm (5' 4.5)  Present weight 82.6 kg (182 lb)  Current BMI (!) 30.77    Weight Status Categories:  Underweight:  BMI < 18.5  Normal Weight:  BMI 18.5 - 24.9  Overweight:  BMI 25 - 29.9  Obesity Class I:  BMI 30 - 34.9  Obesity Class II:  BMI 35 - 39.9  Obesity Class III:  BMI ? 40    Waist Circumference (for BMI >25 and <35 without diabetes):   11/19/2020 : N/A: omitted this encounter    Weight History:  Wt Readings from Last 6 Encounters:   11/19/20 82.6 kg (182 lb)   10/11/20 81.2 kg (179 lb)   08/12/20 84.8 kg (187 lb)   08/05/20 85.7 kg (189 lb)   04/09/20 85.3 kg (188 lb)   02/22/20 84.3 kg (185 lb 12.8 oz)       Allergies:   No Known Allergies    Relevant Medications, Herbs, Supplements:    Current Outpatient Medications:   ???  aspirin-calcium carbonate 81 mg-300 mg calcium(777 mg) Tab, Take 81 mg by mouth., Disp: , Rfl:   ???  clonazePAM (KLONOPIN) 0.5 MG tablet, Take 1 tablet (0.5 mg total) by mouth two (2) times a day as needed for anxiety., Disp: 180 tablet, Rfl: 0  ???  docusate sodium (COLACE) 100 MG capsule, Take 100 mg by mouth daily., Disp: , Rfl:   ???  empty container (SHARPS-A-GATOR DISPOSAL SYSTEM) Misc, Use as directed for sharps disposal, Disp: 1 each, Rfl: 2  ???  evolocumab (REPATHA SURECLICK) 140 mg/mL PnIj, Inject the contents of 1 pen (140 mg) under the skin every fourteen (14) days., Disp: 2 mL, Rfl: 11  ???  ferrous gluconate 324 MG tablet, Take 324 mg by mouth once., Disp: , Rfl:   ???  levETIRAcetam (KEPPRA) 500 MG tablet, Take 1 tablet (500 mg total) by mouth Two (2) times a day., Disp: 180 tablet, Rfl: 1  ???  levothyroxine (SYNTHROID) 100 MCG tablet, Take 100 mcg by mouth daily. , Disp: , Rfl:   ???  lisinopriL (PRINIVIL,ZESTRIL) 20 MG tablet, Take 1 tablet (20 mg total) by mouth daily., Disp: 90 tablet,  Rfl: 3  ???  metFORMIN (GLUCOPHAGE) 500 MG tablet, Take 2 tablets (1,000 mg total) by mouth 2 (two) times a day with meals., Disp: 180 tablet, Rfl: 0  ???  nitrofurantoin, macrocrystal-monohydrate, (MACROBID) 100 MG capsule, Take 1 capsule (100 mg total) by mouth Two (2) times a day., Disp: 20 capsule, Rfl: 0  ???  pravastatin (PRAVACHOL) 10 MG tablet, Take 0.5 tablets (5 mg total) by mouth daily., Disp: 15 tablet, Rfl: 3  ???  SUMAtriptan (IMITREX) 100 MG tablet, Take one tab at onset of headache, may repeat in 2 hours if headache persists of recurs, Disp: 9 tablet, Rfl: 5    Do you have any concerns about taking or affording your medications?  Patient denies     Relevant Labs:  HGB A1C, POC (%)   Date Value   08/10/2017 6.1   05/07/2017 6.0     Hemoglobin A1C (%)   Date Value   10/11/2020 5.4      No components found for: LDLCALC   BP Readings from Last 3 Encounters:   10/11/20 132/70   08/05/20 168/74   04/09/20 143/79     Lab Results   Component Value Date    CHOL 112 08/05/2020    CHOL 135 06/17/2020    CHOL 296 (H) 04/09/2020     Lab Results   Component Value Date    HDL 44 08/05/2020    HDL 46 06/17/2020    HDL 53 04/09/2020     Lab Results   Component Value Date    LDL 48.5 08/05/2020    LDL 34 (L) 08/05/2020    LDL 75.6 06/17/2020    LDL 45 06/17/2020     Lab Results   Component Value Date    VLDL 34 08/05/2020    VLDL 03.4 (H) 06/17/2020    VLDL 59.4 (H) 04/09/2020     Lab Results   Component Value Date    CHOLHDLRATIO 2.5 08/05/2020    CHOLHDLRATIO 2.9 06/17/2020    CHOLHDLRATIO 5.6 (H) 04/09/2020     Lab Results   Component Value Date    TRIG 170 (H) 08/05/2020    TRIG 218 (H) 06/17/2020    TRIG 297 (H) 04/09/2020       No results found for: VITDTOTAL  Lab Results   Component Value Date    VITAMINB12 284 05/07/2017       Blood Glucose Readings: Patient does not check blood glucose at home.     Physical Activity:  Type: housecleaning     24-Hour recall/usual intake:  Time Intake   Breakfast skips   Snack (AM) -   Lunch Oatmeal sometimes   Snack (PM) -   Dinner Soup, sandwich   Snack (HS) -     Other Nutrition Information:  Beverages: water, coffee  Dining Out: N/A  Allergies/Intolerances: liver     Initial Visit (11/19/20): Patient presents today for nutrition counseling related to obesity, hypertension, hyperlipidemia. Patient reports history of persistent UTI she has been trying to manage. At this visit, we discussed nutrition for elevated triglycerides, including managing concentrated sweets, starchy foods, and eating on a regular schedule. Patient feels barriers to success are food prices and currently managing UTI/low iron - doesn't feel well. Feels somewhat confident and motivated to make changes at this visit. Discussed goal-setting towards end of visit - goals listed below.      Estimated Daily Needs:  1200 calories (Mifflin-St Jeor equation for BMI > 25 using actual body  weight, sedentary lifestyle, adjusted 400 kcal for weight loss)   66 g protein (1.0 g protein/kg adjusted body weight for obesity - patient is >120% IBW)     Nutrition Diagnosis:  Food and nutrition related knowledge deficit as related to limited prior nutrition information for hyperlipidemia  as evidenced by elevated triglycerides, patient report    ADVISE -  Nutrition Intervention:  Nutrition Education  Nutrition Counseling    Materials Provided:  11/19/20: high triglycerides nutrition therapy, nutrition for UTIs    Social Determinants of Health interventions provided: Yes - I provided an intervention for the Food Insecurity SDOH domain. The intervention was Other Patient declined intervention on this date     AGREE - Patient Stated Nutrition Goals:  Balance carbohydrate intake with protein, healthy fats, and non-starchy vegetables  Ensure adequate hydration, aim for at least 1.5 L water per day    Goals Added to Visit Navigator?  Yes    ASSIST - Monitoring/Evaluation:  Progress towards goals:  baseline    ARRANGE - Follow-up:  Prn, per patient (return with obesity IBT referral)    Length of visit was 25 minutes (10:56-11:21AM)  1 unit(s) billed per insurance    Hassan Rowan, RD LDN

## 2020-11-18 NOTE — Progress Notes (Signed)
Contacted and scheduled for LCS nodule follow up scan. Patient is a former smoker, quit 11/2011, 90 pack year history.

## 2020-11-19 ENCOUNTER — Ambulatory Visit
Admit: 2020-11-19 | Discharge: 2020-11-20 | Payer: PRIVATE HEALTH INSURANCE | Attending: Registered" | Primary: Registered"

## 2020-11-19 DIAGNOSIS — I10 Essential (primary) hypertension: Principal | ICD-10-CM

## 2020-11-19 DIAGNOSIS — E669 Obesity, unspecified: Principal | ICD-10-CM

## 2020-11-19 DIAGNOSIS — E782 Mixed hyperlipidemia: Principal | ICD-10-CM

## 2020-11-20 NOTE — Unmapped (Signed)
Nutrition Goals:  Balance carbohydrate intake with protein, healthy fats, and non-starchy vegetables  Ensure adequate hydration, aim for at least 1.5 L water per day

## 2020-12-02 MED ORDER — ONETOUCH ULTRA TEST STRIPS
ORAL_STRIP | 3 refills | 0 days | Status: CP
Start: 2020-12-02 — End: 2021-12-02

## 2020-12-02 NOTE — Unmapped (Signed)
Patient is requesting the following refill  Requested Prescriptions     Pending Prescriptions Disp Refills   ??? blood sugar diagnostic (ONETOUCH ULTRA TEST) Strp 100 strip 5     Sig: Test blood sugars once daily or as directed       Recent Visits  Date Type Provider Dept   10/11/20 Office Visit Keri Rosita Fire, FNP [facility]   Showing recent visits within past 365 days with a meds authorizing provider and meeting all other requirements  Future Appointments  Date Type Provider Dept   01/13/21 Appointment [name], MD [facility]   Showing future appointments within next 365 days with a meds authorizing provider and meeting all other requirements       Labs:   A1c:   HGB A1C, POC (%)   Date Value   08/10/2017 6.1     Hemoglobin A1C (%)   Date Value   10/11/2020 5.4

## 2020-12-05 ENCOUNTER — Ambulatory Visit: Admit: 2020-12-05 | Discharge: 2020-12-06 | Payer: PRIVATE HEALTH INSURANCE

## 2020-12-05 DIAGNOSIS — R2232 Localized swelling, mass and lump, left upper limb: Secondary | ICD-10-CM | POA: Diagnosis not present

## 2020-12-06 NOTE — Unmapped (Signed)
[facility] Refill Coordination Note    Specialty Medication(s) to be Shipped:   General Specialty: Repatha    Other medication(s) to be shipped: No additional medications requested for fill at this time     Amber Duncan, DOB: 06-19-1951  Phone: [phone] (home)       All above HIPAA information was verified with patient.     Was a Nurse, learning disability used for this call? No    Completed refill call assessment today to schedule patient's medication shipment from the [facility] [phone]).       Specialty medication(s) and dose(s) confirmed: Regimen is correct and unchanged.   Changes to medications: [name] reports no changes at this time.  Changes to insurance: No  Questions for the pharmacist: No    Confirmed patient received a Conservation officer, historic buildings and a Surveyor, mining with first shipment. The patient will receive a drug information handout for each medication shipped and additional FDA Medication Guides as required.       DISEASE/MEDICATION-SPECIFIC INFORMATION        For patients on injectable medications: Patient currently has 0 doses left.  Next injection is scheduled for 12/18/20.    SPECIALTY MEDICATION ADHERENCE     Medication Adherence    Patient reported X missed doses in the last month: 0  Specialty Medication: Repatha 140mg /ml  Patient is on additional specialty medications: No  Patient is on more than two specialty medications: No                Repatha  140 mg/ml: 0 days of medicine on hand         SHIPPING     Shipping address confirmed in Epic.     Delivery Scheduled: Yes, Expected medication delivery date: 12/16/20.     Medication will be delivered via Same Day Courier to the prescription address in Epic [facility].    Nancy Nordmann [facility] Specialty Technician

## 2020-12-09 MED ORDER — LEVETIRACETAM 500 MG TABLET
ORAL_TABLET | 1 refills | 0 days
Start: 2020-12-09 — End: ?

## 2020-12-10 MED ORDER — LEVETIRACETAM 500 MG TABLET
ORAL_TABLET | 1 refills | 0 days | Status: CP
Start: 2020-12-10 — End: ?

## 2020-12-10 NOTE — Unmapped (Signed)
Last Visit Date: 08/12/2020  Next Visit Date: 03/03/2021    No results found for: CBC, CMP     Results for orders placed or performed in visit on 01/09/20   ECG 12 Lead   Result Value Ref Range    EKG Systolic BP  mmHg    EKG Diastolic BP  mmHg    EKG Ventricular Rate 93 BPM    EKG Atrial Rate 93 BPM    EKG P-R Interval 142 ms    EKG QRS Duration 86 ms    EKG Q-T Interval 348 ms    EKG QTC Calculation 432 ms    EKG Calculated P Axis 54 degrees    EKG Calculated R Axis 29 degrees    EKG Calculated T Axis 27 degrees    QTC Fredericia 403 ms

## 2020-12-16 MED ORDER — CLONAZEPAM 0.5 MG TABLET
ORAL_TABLET | 0 refills | 0 days
Start: 2020-12-16 — End: ?

## 2020-12-16 MED FILL — REPATHA SURECLICK 140 MG/ML SUBCUTANEOUS PEN INJECTOR: SUBCUTANEOUS | 28 days supply | Qty: 2 | Fill #4

## 2020-12-17 MED ORDER — CLONAZEPAM 0.5 MG TABLET
ORAL_TABLET | ORAL | 0 refills | 0.00000 days | Status: CP
Start: 2020-12-17 — End: ?

## 2020-12-18 ENCOUNTER — Ambulatory Visit
Admission: RE | Admit: 2020-12-18 | Discharge: 2020-12-18 | Disposition: A | Discharge status: Home / Self Care | Payer: PPO | Source: Ambulatory Visit | Attending: Oncology | Admitting: Oncology

## 2020-12-18 ENCOUNTER — Other Ambulatory Visit: Payer: Self-pay

## 2020-12-18 DIAGNOSIS — J432 Centrilobular emphysema: Secondary | ICD-10-CM | POA: Diagnosis not present

## 2020-12-18 DIAGNOSIS — R911 Solitary pulmonary nodule: Secondary | ICD-10-CM | POA: Diagnosis not present

## 2020-12-18 DIAGNOSIS — R918 Other nonspecific abnormal finding of lung field: Secondary | ICD-10-CM | POA: Diagnosis not present

## 2020-12-18 DIAGNOSIS — Z87891 Personal history of nicotine dependence: Secondary | ICD-10-CM | POA: Diagnosis not present

## 2020-12-18 DIAGNOSIS — I700 Atherosclerosis of aorta: Secondary | ICD-10-CM | POA: Diagnosis not present

## 2020-12-18 DIAGNOSIS — I2510 Atherosclerotic heart disease of native coronary artery without angina pectoris: Secondary | ICD-10-CM | POA: Diagnosis not present

## 2020-12-19 NOTE — Unmapped (Signed)
Pt returned call and I was able to r/s her appt with Dr Carles Collet and mmg to 5/24. Pt verbally confirmed appts

## 2020-12-19 NOTE — Unmapped (Signed)
Called to get pt r/s from [facility] 5/19 bump list. Left callback #

## 2020-12-20 DIAGNOSIS — E782 Mixed hyperlipidemia: Principal | ICD-10-CM

## 2020-12-20 MED ORDER — PRAVASTATIN 10 MG TABLET
ORAL_TABLET | Freq: Every day | ORAL | 3 refills | 30 days | Status: CP
Start: 2020-12-20 — End: ?

## 2020-12-23 ENCOUNTER — Institutional Professional Consult (permissible substitution): Admit: 2020-12-23 | Discharge: 2020-12-24 | Payer: PRIVATE HEALTH INSURANCE

## 2020-12-23 DIAGNOSIS — R300 Dysuria: Principal | ICD-10-CM

## 2020-12-23 LAB — URINALYSIS WITH CULTURE REFLEX
BILIRUBIN UA: NEGATIVE
BLOOD UA: NEGATIVE
NITRITE UA: POSITIVE — AB
PH UA: 7 (ref 5.0–9.0)
PROTEIN UA: 100 — AB
RBC UA: 7 /HPF — ABNORMAL HIGH (ref <=4)
SPECIFIC GRAVITY UA: 1.015 (ref 1.003–1.030)
SQUAMOUS EPITHELIAL: 15 /HPF — ABNORMAL HIGH (ref 0–5)
UROBILINOGEN UA: 2 — AB
WBC UA: >182 /HPF — ABNORMAL HIGH (ref 0–5)

## 2020-12-23 NOTE — Unmapped (Signed)
Patient having symptoms of UTI exactly as they were a month ago.Burning and pain

## 2020-12-24 ENCOUNTER — Encounter: Payer: Self-pay | Admitting: *Deleted

## 2020-12-24 DIAGNOSIS — N3001 Acute cystitis with hematuria: Principal | ICD-10-CM

## 2020-12-24 MED ORDER — CIPROFLOXACIN 500 MG TABLET
ORAL_TABLET | Freq: Two times a day (BID) | ORAL | 0 refills | 10 days | Status: CP
Start: 2020-12-24 — End: 2021-01-03

## 2020-12-24 NOTE — Unmapped (Signed)
Patient called yesterday with same symptoms she had last month, burning and painful urination.  Patient gave [place] a urine sample, initial results in patient is [facility].  Please advise

## 2020-12-25 NOTE — Unmapped (Signed)
Addended by: Noralyn Pick on: 12/24/2020 05:24 PM     Modules accepted: Orders

## 2020-12-26 NOTE — Unmapped (Signed)
Antibx Rx has been sent in.

## 2020-12-27 DIAGNOSIS — M79672 Pain in left foot: Secondary | ICD-10-CM | POA: Diagnosis not present

## 2020-12-27 DIAGNOSIS — S9032XA Contusion of left foot, initial encounter: Secondary | ICD-10-CM | POA: Diagnosis not present

## 2020-12-27 DIAGNOSIS — S92302A Fracture of unspecified metatarsal bone(s), left foot, initial encounter for closed fracture: Secondary | ICD-10-CM | POA: Diagnosis not present

## 2020-12-27 DIAGNOSIS — S92355A Nondisplaced fracture of fifth metatarsal bone, left foot, initial encounter for closed fracture: Secondary | ICD-10-CM | POA: Diagnosis not present

## 2020-12-27 DIAGNOSIS — S92345A Nondisplaced fracture of fourth metatarsal bone, left foot, initial encounter for closed fracture: Secondary | ICD-10-CM | POA: Diagnosis not present

## 2020-12-31 ENCOUNTER — Ambulatory Visit
Admit: 2020-12-31 | Discharge: 2021-01-01 | Payer: PRIVATE HEALTH INSURANCE | Attending: Cardiovascular Disease | Primary: Cardiovascular Disease

## 2020-12-31 DIAGNOSIS — Z170 Estrogen receptor positive status [ER+]: Principal | ICD-10-CM

## 2020-12-31 DIAGNOSIS — I2510 Atherosclerotic heart disease of native coronary artery without angina pectoris: Principal | ICD-10-CM

## 2020-12-31 DIAGNOSIS — E119 Type 2 diabetes mellitus without complications: Principal | ICD-10-CM

## 2020-12-31 DIAGNOSIS — I10 Essential (primary) hypertension: Principal | ICD-10-CM

## 2020-12-31 DIAGNOSIS — N390 Urinary tract infection, site not specified: Principal | ICD-10-CM

## 2020-12-31 DIAGNOSIS — E782 Mixed hyperlipidemia: Principal | ICD-10-CM

## 2020-12-31 DIAGNOSIS — C50312 Malignant neoplasm of lower-inner quadrant of left female breast: Principal | ICD-10-CM

## 2020-12-31 DIAGNOSIS — S99102D Unspecified physeal fracture of left metatarsal, subsequent encounter for fracture with routine healing: Principal | ICD-10-CM

## 2020-12-31 DIAGNOSIS — J449 Chronic obstructive pulmonary disease, unspecified: Secondary | ICD-10-CM | POA: Diagnosis not present

## 2020-12-31 DIAGNOSIS — Z6829 Body mass index (BMI) 29.0-29.9, adult: Secondary | ICD-10-CM | POA: Diagnosis not present

## 2020-12-31 DIAGNOSIS — D500 Iron deficiency anemia secondary to blood loss (chronic): Secondary | ICD-10-CM | POA: Diagnosis not present

## 2020-12-31 DIAGNOSIS — Z7982 Long term (current) use of aspirin: Secondary | ICD-10-CM | POA: Diagnosis not present

## 2020-12-31 DIAGNOSIS — Z7984 Long term (current) use of oral hypoglycemic drugs: Secondary | ICD-10-CM | POA: Diagnosis not present

## 2020-12-31 DIAGNOSIS — E1165 Type 2 diabetes mellitus with hyperglycemia: Secondary | ICD-10-CM | POA: Diagnosis not present

## 2020-12-31 DIAGNOSIS — Z87891 Personal history of nicotine dependence: Secondary | ICD-10-CM | POA: Diagnosis not present

## 2020-12-31 DIAGNOSIS — G43909 Migraine, unspecified, not intractable, without status migrainosus: Secondary | ICD-10-CM | POA: Diagnosis not present

## 2020-12-31 DIAGNOSIS — R9431 Abnormal electrocardiogram [ECG] [EKG]: Secondary | ICD-10-CM | POA: Diagnosis not present

## 2020-12-31 DIAGNOSIS — I25119 Atherosclerotic heart disease of native coronary artery with unspecified angina pectoris: Secondary | ICD-10-CM | POA: Diagnosis not present

## 2020-12-31 DIAGNOSIS — E079 Disorder of thyroid, unspecified: Secondary | ICD-10-CM | POA: Diagnosis not present

## 2020-12-31 DIAGNOSIS — E785 Hyperlipidemia, unspecified: Secondary | ICD-10-CM | POA: Diagnosis not present

## 2020-12-31 LAB — COMPREHENSIVE METABOLIC PANEL
ALBUMIN: 4.1 g/dL (ref 3.4–5.0)
ALKALINE PHOSPHATASE: 81 U/L (ref 46–116)
ALT (SGPT): 22 U/L (ref 10–49)
ANION GAP: 7 mmol/L (ref 5–14)
AST (SGOT): 31 U/L (ref <=34)
BILIRUBIN TOTAL: 0.4 mg/dL (ref 0.3–1.2)
BLOOD UREA NITROGEN: 19 mg/dL (ref 9–23)
BUN / CREAT RATIO: 19
CALCIUM: 9.8 mg/dL (ref 8.7–10.4)
CHLORIDE: 105 mmol/L (ref 98–107)
CO2: 25.3 mmol/L (ref 20.0–31.0)
CREATININE: 1 mg/dL — ABNORMAL HIGH
EGFR CKD-EPI AA FEMALE: 65 mL/min/{1.73_m2} (ref >=60)
EGFR CKD-EPI NON-AA FEMALE: 57 mL/min/{1.73_m2} — ABNORMAL LOW (ref >=60)
GLUCOSE RANDOM: 151 mg/dL — ABNORMAL HIGH (ref 70–99)
POTASSIUM: 4.2 mmol/L (ref 3.5–5.1)
PROTEIN TOTAL: 7.5 g/dL (ref 5.7–8.2)
SODIUM: 137 mmol/L (ref 135–145)

## 2020-12-31 NOTE — Unmapped (Signed)
[facility] Return Patient/Consult Note    Referring Provider: Gracelyn Nurse, MD   Gracelyn Nurse, MD  [address] [facility]  [facility]  [place],  [place] [address]   Primary Provider: [facility]   [address] [place], [place] [address]  [place] [address]       Reason for Visit:  Amber Duncan is a 70 y.o. female who returns for ongoing management of CAD in the setting of treatment for breast cancer.    Assessment & Plan:  1. Atherosclerosis of native coronary artery of native heart without angina pectoris  She has undergone 2 PCI's to her RCA, most recently in the setting of stable angina in November 2020. At present she is asymptomatic. She completed 6 months of DAPT, adequate for zotarolimus-eluting stents such as hers, particularly given that she has symptomatic anemia due to GI bleed. She developed myalgias to multiple statins in the past, and became markedly hyperglycemic on rosuvastatin. She started Repatha in 2021, and denies any issues administering the injection - recent LDL 34 (08/05/20), down from 184 (04/09/20). Recently began taking 5 mg pravastatin (half of 10mg  tab), and denies any muscle pain.  --continue ASA 81 mg daily indefinitely  --continue Repatha and low dose pravastatin    2. Essential hypertension  Longstanding with adequate control on current regimen.  --continue lisinopril 20 mg daily    3. Metatarsal fractures  She was seen in Urgent Care and now is in a boot, but has no established follow up. I suggested that she visit the [facility] walk-in clinic at [facility].    4. Recurrent UTIs  She reports 4 UTIs in the past 5 months. I placed a referral to [facility].    5. Malignant neoplasm of female breast, unspecified estrogen receptor status, unspecified laterality, unspecified site of breast (CMS-HCC)  Details of diagnosis and evaluation can be found below. Ongoing evaluation and management per Dr. Dellis Anes and the Oncology team.    RTC in 6 months    History of Present Illness:  Amber Duncan who returns for ongoing management of CAD in the setting of treatment for breast cancer. [name] was diagnosed with CAD in 2008 and underwent PCI at that time. She had no further cardiac issues until Fall 2020 when she developed exertional chest tightness that radiated down her left arm. She underwent a myocardial perfusion scan followed by a left heart catheterization, during which she received a PCI to the RCA (07/31/19). Thereafter her exertional symptoms resolved and have not recurred.     Today, Amber Duncan reports that she is not doing well. About a week ago, she fell on her left side and broke some bones in her left foot. A walk-in clinic informed her that some bones were jammed together, and she inquires about the urgency to deal with this situation. Additionally, she is being treated for a UTI with ciprofloxacin, and endorses four recent UTIs this year. Currently she denies any pain, but endorses sharp bladder pain and burning that occur in the evening. Due to her foot injury, she admittedly has not been active as of late, however before the injury, she saw a decline in her activity. She continues to complain of significant fatigue, though it remains stable. She helps around 6 hours a week with various cleaning activities. 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 Amber Duncan endorses spontaneous dizziness, as well as LE edema. This edema occurs most days and usually at night, and some days she will wake up with swelling. She denies recent chest pain, palpitations, PND (she sleeps with a CPAP and endorses benefit from it). Home vitals include BP measurements in the 130s/70s, and HR around 95. She states her heart rate is chronically high, and often feels her heart racing even at rest.     Cardiovascular History & Procedures:  Cath / PCI:  07/31/19  ?? Mid Cx to Dist Cx lesion is 50% stenosed.   ?? Prox RCA lesion is 40% stenosed.   ?? Dist RCA lesion is 90% stenosed.   ?? Previously placed Mid RCA to Dist RCA stent (unknown type) is widely  patent.   ?? A drug-eluting stent was successfully placed using a STENT RESOLUTE ONYX  [id number].   ?? Post intervention, there is a 0% residual stenosis.     1. ??One-vessel CAD with patent stent mid to distal RCA with 90% stenosis   in distal native vessel just beyond the stent   2. ??Normal left ventricular function   3. ??Successful PCI with DES distal RCA    PCI to RCA 2008    CV Surgery:  None  EP Procedures and Devices:  None    Non-Invasive Evaluation(s):  Echo:  07/20/19  NORMAL LEFT VENTRICULAR SYSTOLIC FUNCTION ?? WITH MILD LVH   NORMAL RIGHT VENTRICULAR SYSTOLIC FUNCTION   MILD VALVULAR REGURGITATION (See above)   NO VALVULAR STENOSIS   TRIVIAL MR, TR   MILD PR   EF 50-55%     CT/MRI/Nuclear Tests:  Myocardial perfusion scan 07/20/19  1. ??Negative ETT with nonspecific ST abnormalities   2. ??Normal left ventricular function   3. ??Normal wall motion   4. ??Minimal to mild apical wall ischemia     Past Medical History:   Diagnosis Date   ??? Breast cancer (CMS-HCC)    ??? Chronic UTI    ??? COPD (chronic obstructive pulmonary disease) (CMS-HCC)    ??? Coronary artery disease    ??? Diabetes (CMS-HCC)    ??? Disease of thyroid gland    ??? Fatigue    ??? Hepatitis    ??? Hepatitis    ??? Hyperlipidemia    ??? Hypertension    ??? Migraine    ??? Sleep apnea       Oncology History Overview Note   2021: Left breast pT1b N0 IDC, G1, +/+/-     Malignant neoplasm of lower-inner quadrant of left breast in female, estrogen receptor positive (CMS-HCC)   10/24/2019 -  Presenting Symptoms    Abnormal screening MMG: asymmetry in the left breast     11/07/2019 Interval Scan(s)    MMG with tomo: Left breast LOQ small mass with associated distortion.  [place]: Left breast 7 x 4 x 6 mm irregular hypoechoic mass at the 5 o???clock 4 CFN position.     11/15/2019 Biopsy    Left breast stereotactic biopsy 5:00, 4CFN: IDC, G1, ER 95% positive, PR 80% positive. HER2- (1+) and associated DCIS.     12/26/2019 Initial Diagnosis    Malignant neoplasm of lower-inner quadrant of left breast in female, estrogen receptor positive (CMS-HCC)     12/27/2019 Tumor Board    MDC recs: Left breast cT1b N0 IDC, G1, +/+/-. 7 mm on imaging review, excellent candidate for BCT. Candidate to  omit axillary staging. Possible candidate to omit radiation. Will see med/onc post op.      12/27/2019 -  Cancer Staged    Staging form: Breast, AJCC 8th Edition  - Clinical stage from 12/27/2019: Stage IA (cT1b, cN0, cM0, G1, ER+, PR+, HER2-) - Signed by Talbert Cage, DO on 12/27/2019       01/19/2020 Surgery    Left US-guided partial mastectomy performed by Dr. Dellis Anes. Final pathology showed IDC 7mm in greatest dimension with DCIS grade 2, all margins clear     01/29/2020 -  Cancer Staged    Staging form: Breast, AJCC 8th Edition  - Pathologic stage from 01/29/2020: Stage IA (pT1b, pN0, cM0, G1, ER+, PR+, HER2-) - Signed by Talbert Cage, DO on 02/06/2020           Past Surgical History:   Procedure Laterality Date   ??? BACK SURGERY     ??? BREAST BIOPSY     ??? BREAST LUMPECTOMY Left 01/2020   ??? CATARACT EXTRACTION     ??? CHOLECYSTECTOMY     ??? CORONARY STENT PLACEMENT     ??? HYSTERECTOMY     ??? KNEE SURGERY     ??? PR COLONOSCOPY,ABLATE LESION N/A 01/21/2017    Procedure: COLONOSCOPY FLEX; W/ABLAT LES NOT AMENABLE-SNARE;  Surgeon: Luanne Bras, MD;  [facility]: [facility];  Service: Gastroenterology   ??? PR COLSC FLX W/RMVL OF TUMOR POLYP LESION SNARE TQ N/A 01/21/2017    Procedure: COLONOSCOPY FLEX; W/REMOV TUMOR/LES BY SNARE;  Surgeon: Luanne Bras, MD;  [facility]: [facility];  Service: Gastroenterology   ??? PR MASTECTOMY, PARTIAL Left 01/19/2020    Procedure: MASTECTOMY, PARTIAL (EG, LUMPECTOMY, TYLECTOMY, QUADRANTECTOMY, SEGMENTECTOMY);  Surgeon: Talbert Cage, DO;  Location: MAIN OR [facility];  Service: Surgical Oncology Breast   ??? PR UPPER GI ENDOSCOPY,BIOPSY N/A 04/29/2018    Procedure: UGI ENDOSCOPY; WITH BIOPSY, SINGLE OR MULTIPLE;  Surgeon: Rona Ravens, MD;  [facility];  Service: Gastroenterology   ??? RADIATION Left    ??? REDUCTION MAMMAPLASTY           Allergies:  Patient has no known allergies.    Current Medications:  Current Outpatient Medications   Medication Sig Dispense Refill   ??? aspirin-calcium carbonate 81 mg-300 mg calcium(777 mg) Tab Take 81 mg by mouth.     ??? blood sugar diagnostic (ONETOUCH ULTRA TEST) Strp Test blood sugars once daily or as directed 100 strip 3   ??? ciprofloxacin HCl (CIPRO) 500 MG tablet Take 1 tablet (500 mg total) by mouth Two (2) times a day for 10 days. 20 tablet 0   ??? clonazePAM (KLONOPIN) 0.5 MG tablet TAKE ONE TABLET BY MOUTH TWICE DAILY AS NEEDED FOR ANXIETY 180 tablet 0   ??? docusate sodium (COLACE) 100 MG capsule Take 100 mg by mouth daily.     ??? empty container (SHARPS-A-GATOR DISPOSAL SYSTEM) Misc Use as directed for sharps disposal 1 each 2   ??? evolocumab (REPATHA SURECLICK) 140 mg/mL PnIj Inject the contents of 1 pen (140 mg) under the skin every fourteen (14) days. 2 mL 11   ??? ferrous gluconate 324 MG tablet Take 324 mg by mouth once.     ??? levETIRAcetam (KEPPRA) 500 MG tablet TAKE 1 TABLET BY MOUTH TWICE DAILY 180 tablet 1   ??? levothyroxine (SYNTHROID) 100 MCG tablet Take 100 mcg by mouth daily.      ??? lisinopriL (PRINIVIL,ZESTRIL) 20 MG  tablet Take 1 tablet (20 mg total) by mouth daily. 90 tablet 3   ??? metFORMIN (GLUCOPHAGE) 500 MG tablet Take 2 tablets (1,000 mg total) by mouth 2 (two) times a day with meals. 180 tablet 0   ??? pravastatin (PRAVACHOL) 10 MG tablet Take 0.5 tablets (5 mg total) by mouth daily. 15 tablet 3   ??? SUMAtriptan (IMITREX) 100 MG tablet Take one tab at onset of headache, may repeat in 2 hours if headache persists of recurs 9 tablet 5     No current facility-administered medications for this visit.       Family History:  There is no family history of premature coronary artery disease or sudden cardiac death.   Family History   Problem Relation Age of Onset   ??? Cancer Brother         liver   ??? Cancer Maternal Aunt    ??? Cancer Maternal Uncle    ??? Diabetes Paternal Uncle    ??? Other Mother         MVA   ??? Other Father         MVA   ??? No Known Problems Sister    ??? No Known Problems Son    ??? Cancer Brother         Lung   ??? Other Sister         MVA   ??? Other Sister         MVA drunk driver   ??? No Known Problems Brother    ??? No Known Problems Daughter    ??? No Known Problems Maternal Grandmother    ??? No Known Problems Maternal Grandfather    ??? No Known Problems Paternal Grandmother    ??? No Known Problems Paternal Grandfather    ??? No Known Problems Other    ??? Heart failure Neg Hx    ??? BRCA 1/2 Neg Hx    ??? Breast cancer Neg Hx    ??? Colon cancer Neg Hx    ??? Endometrial cancer Neg Hx    ??? 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 She quit smoking roughly 8 years ago.  Social History     Tobacco Use   ??? Smoking status: Former Smoker     Packs/day: 2.00     Years: 48.00     Pack years: 96.00     Types: Cigarettes     Quit date: 11/06/2011     Years since quitting: 9.1   ??? Smokeless tobacco: Never Used   Vaping Use   ??? Vaping Use: Never used   Substance Use Topics   ??? Alcohol use: No   ??? Drug use: No       Review of Systems:  A full review of 10 systems is unremarkable except as stated in the HPI.    Physical Exam:  VITAL SIGNS:   Vitals:    12/31/20 0823   BP: 145/86   Pulse: 87   Temp: 36.1 ??C (97 ??F)   SpO2: 98%      Wt Readings from Last 3 Encounters:   12/31/20 79.4 kg (175 lb)   11/19/20 82.6 kg (182 lb)   10/11/20 81.2 kg (179 lb)      Today's Body mass index is 29.57 kg/m??.     GENERAL: well-appearing in no acute distress  HEENT: Normocephalic and atraumatic with anicteric sclerae  NECK: Supple, without lymphadenopathy or thyromegaly. JVP flat. There are no carotid bruits  CARDIOVASCULAR: Regular S1S2 without audible murmur or gallop  RESPIRATORY: Clear to auscultation without wheezes or rales.   ABDOMEN: Soft, non-tender, non-distended with audible bowel sounds. There is no organomegaly or palpable pulsatile mass.   EXTREMITIES:  Lower extremities are warm and without edema. L DP 2+ R DP 1+   SKIN: No rashes, ecchymosis or petechiae.  NEURO: Alert, pleasant, and appropriate. Non-focal neuro exam    Pertinent Laboratory Studies:   Lab Results   Component Value Date    Creatinine Whole Blood, POC 0.8 07/10/2016    Creatinine 0.99 (H) 04/23/2020    Creatinine 1.00 (H) 04/09/2020    BUN 20 04/23/2020    BUN 22 04/09/2020    Potassium 5.1 (H) 04/23/2020    Potassium 4.7 (H) 04/09/2020    AST 16 04/09/2020    AST 30 05/26/2018    ALT 7 (L) 04/09/2020    ALT 29 05/26/2018    TSH 0.704 04/09/2020    Total Bilirubin 0.2 (L) 04/09/2020    Total Bilirubin 0.4 05/26/2018    INR 1.03 05/26/2018    INR 1.18 01/20/2017    WBC 4.8 05/26/2018    Hemoglobin 12.1 10/11/2020    HCT 40.1 05/26/2018    Platelet 248 05/26/2018    Triglycerides 170 (H) 08/05/2020    HDL 44 08/05/2020    Non-HDL Cholesterol 68 (L) 08/05/2020    LDL Calculated 34 (L) 08/05/2020    LDL Direct 48.5 08/05/2020       Other pertinent records were reviewed.    Pertinent Test Results from Today:  None    I personally spent 40 minutes face-to-face and non-face-to-face in the care of this patient, which includes all pre, intra, and post visit time on the date of service.

## 2020-12-31 NOTE — Unmapped (Signed)
Very nice to see you. Sorry that your foot is so painful. I recommend going to the [facility] (Orthopedics walk-in clinic). The address is:  [address], [place], [place] [address]   [phone] for any questions. No appointment is required.    I am pleased with the way things are going with your heart. Please continue with your current medications.    I will contact you with the results of your blood work.    See you again in 6 months.

## 2021-01-01 ENCOUNTER — Ambulatory Visit: Admit: 2021-01-01 | Discharge: 2021-01-02 | Payer: PRIVATE HEALTH INSURANCE

## 2021-01-01 DIAGNOSIS — G43909 Migraine, unspecified, not intractable, without status migrainosus: Secondary | ICD-10-CM | POA: Diagnosis not present

## 2021-01-01 DIAGNOSIS — I2510 Atherosclerotic heart disease of native coronary artery without angina pectoris: Secondary | ICD-10-CM | POA: Diagnosis not present

## 2021-01-01 DIAGNOSIS — Z87891 Personal history of nicotine dependence: Secondary | ICD-10-CM | POA: Diagnosis not present

## 2021-01-01 DIAGNOSIS — E119 Type 2 diabetes mellitus without complications: Secondary | ICD-10-CM | POA: Diagnosis not present

## 2021-01-01 DIAGNOSIS — R5383 Other fatigue: Secondary | ICD-10-CM | POA: Diagnosis not present

## 2021-01-01 DIAGNOSIS — I10 Essential (primary) hypertension: Secondary | ICD-10-CM | POA: Diagnosis not present

## 2021-01-01 DIAGNOSIS — S92345A Nondisplaced fracture of fourth metatarsal bone, left foot, initial encounter for closed fracture: Secondary | ICD-10-CM | POA: Diagnosis not present

## 2021-01-01 DIAGNOSIS — M79672 Pain in left foot: Secondary | ICD-10-CM | POA: Diagnosis not present

## 2021-01-01 DIAGNOSIS — G4730 Sleep apnea, unspecified: Secondary | ICD-10-CM | POA: Diagnosis not present

## 2021-01-01 DIAGNOSIS — E079 Disorder of thyroid, unspecified: Secondary | ICD-10-CM | POA: Diagnosis not present

## 2021-01-01 DIAGNOSIS — E785 Hyperlipidemia, unspecified: Secondary | ICD-10-CM | POA: Diagnosis not present

## 2021-01-01 DIAGNOSIS — S92325A Nondisplaced fracture of second metatarsal bone, left foot, initial encounter for closed fracture: Secondary | ICD-10-CM | POA: Diagnosis not present

## 2021-01-01 DIAGNOSIS — Z7984 Long term (current) use of oral hypoglycemic drugs: Secondary | ICD-10-CM | POA: Diagnosis not present

## 2021-01-01 DIAGNOSIS — J449 Chronic obstructive pulmonary disease, unspecified: Secondary | ICD-10-CM | POA: Diagnosis not present

## 2021-01-01 DIAGNOSIS — S92335A Nondisplaced fracture of third metatarsal bone, left foot, initial encounter for closed fracture: Secondary | ICD-10-CM | POA: Diagnosis not present

## 2021-01-01 MED ORDER — OXYCODONE 5 MG TABLET
ORAL_TABLET | Freq: Three times a day (TID) | ORAL | 0 refills | 5 days | Status: CP | PRN
Start: 2021-01-01 — End: ?

## 2021-01-01 NOTE — Unmapped (Signed)
Encounter Provider: Mitzi Davenport, PA  Date of Service: 01/01/2021  Primary Care Provider: [facility]    Amber Duncan is a 70 y.o. female   ASSESSMENT       ICD-10-CM   1. Closed nondisplaced fracture of second metatarsal bone of left foot, initial encounter  S92.325A   2. Closed nondisplaced fracture of third metatarsal bone of left foot, initial encounter  S92.335A   3. Closed nondisplaced fracture of fourth metatarsal bone of left foot, initial encounter  S92.345A   Sustained on 12/26/2020    PLAN:   -Patient understands that the current fracture appears stable and is amenable to non operative treatment if there is no change in alignment  - Prescription for oxycodone given for pain  Advised boot is for ambulation purposes only  She understands her fractures typically heal up in 4 to 6 weeks and there can be 3 to 6 months of pain  -Advised OTC analgesic PRN pain  -Discussed treatment options and patient was amenable to the above plan and was instructed to call and be seen or got to the emergency department if there is any increasing pain or concerns.      Scheduling Notes:  5 weeks with Asa Lente, 3 views left foot weightbearing     Requested Prescriptions     Signed Prescriptions Disp Refills   ??? oxyCODONE (ROXICODONE) 5 MG immediate release tablet 15 tablet 0     Sig: Take 1 tablet (5 mg total) by mouth every eight (8) hours as needed for pain.      Orders Placed This Encounter   Procedures   ??? XR Foot 3 Or More Views Left       History:  Chief complaint: Left foot injury    The primary encounter diagnosis was Closed nondisplaced fracture of second metatarsal bone of left foot, initial encounter. Diagnoses of Closed nondisplaced fracture of third metatarsal bone of left foot, initial encounter and Closed nondisplaced fracture of fourth metatarsal bone of left foot, initial encounter were also pertinent to this visit.  HPI:  Amber Duncan is a 70 y.o. with a PMHx of previous right lower extremity surgery for what sounds to be tibia fracture with compartment syndrome and subsequent TKA, previous right fifth toe surgery, Breast CA, COPD, CAD, DM, Thyroid Disease, HLD, HTN, OSA presenting to [facility] complaining of left foot injury sustained on 12/26/2020 when she slipped on a gravel driveway impacting her foot into a tire.  She felt a pop there was pain swelling bruising.  She immediately went to urgent care facility x-rays revealed metatarsal neck fracture she was placed in a walking boot.  Pain is worse with walking and improved with cane elevation and ice.  Pain Assessment: 0-10  0-10 Pain Scale: 3    Review of Systems  .   Marland Kitchen   Medical History Past Medical History:   Diagnosis Date   ??? Breast cancer (CMS-HCC)    ??? Chronic UTI    ??? COPD (chronic obstructive pulmonary disease) (CMS-HCC)    ??? Coronary artery disease    ??? Diabetes (CMS-HCC)    ??? Disease of thyroid gland    ??? Fatigue    ??? Hepatitis    ??? Hepatitis    ??? Hyperlipidemia    ??? Hypertension    ??? Migraine    ??? Sleep apnea       Surgical History Past Surgical History:   Procedure Laterality Date   ??? BACK  SURGERY     ??? BREAST BIOPSY     ??? BREAST LUMPECTOMY Left 01/2020   ??? CATARACT EXTRACTION     ??? CHOLECYSTECTOMY     ??? CORONARY STENT PLACEMENT     ??? HYSTERECTOMY     ??? KNEE SURGERY     ??? PR COLONOSCOPY,ABLATE LESION N/A 01/21/2017    Procedure: COLONOSCOPY FLEX; W/ABLAT LES NOT AMENABLE-SNARE;  Surgeon: Luanne Bras, MD;  Location: [facility];  Service: Gastroenterology   ??? PR COLSC FLX W/RMVL OF TUMOR POLYP LESION SNARE TQ N/A 01/21/2017    Procedure: COLONOSCOPY FLEX; W/REMOV TUMOR/LES BY SNARE;  Surgeon: Luanne Bras, MD;  [facility]: [facility];  Service: Gastroenterology   ??? PR MASTECTOMY, PARTIAL Left 01/19/2020    Procedure: MASTECTOMY, PARTIAL (EG, LUMPECTOMY, TYLECTOMY, QUADRANTECTOMY, SEGMENTECTOMY);  Surgeon: Talbert Cage, DO;  Location: MAIN OR [facility];  Service: Surgical Oncology Breast   ??? PR UPPER GI ENDOSCOPY,BIOPSY N/A 04/29/2018    Procedure: UGI ENDOSCOPY; WITH BIOPSY, SINGLE OR MULTIPLE;  Surgeon: Rona Ravens, MD;  [facility];  Service: Gastroenterology   ??? RADIATION Left    ??? REDUCTION MAMMAPLASTY        Allergies Patient has no known allergies.   Medications She has a current medication list which includes the following prescription(s): aspirin-calcium carbonate, onetouch ultra test, ciprofloxacin hcl, clonazepam, docusate sodium, empty container, repatha sureclick, ferrous gluconate, levetiracetam, levothyroxine, lisinopril, metformin, oxycodone, pravastatin, and sumatriptan.   Family History {Her family history includes Cancer in her brother, brother, maternal aunt, and maternal uncle; Diabetes in her paternal uncle; No Known Problems in her brother, daughter, maternal grandfather, maternal grandmother, paternal grandfather, paternal grandmother, sister, son, and another family member; Other in her father, mother, sister, and sister.   Social History She reports that she quit smoking about 9 years ago. Her smoking use included cigarettes. She has a 96.00 pack-year smoking history. She has never used smokeless tobacco. She reports that she does not drink alcohol and does not use drugs.Home address:[address]  [place] [address]  Occupation:         Occupational History   ??? Not on file     Social History     Social History Narrative   ??? Not on file            Exam:  The primary encounter diagnosis was Closed nondisplaced fracture of second metatarsal bone of left foot, initial encounter. Diagnoses of Closed nondisplaced fracture of third metatarsal bone of left foot, initial encounter and Closed nondisplaced fracture of fourth metatarsal bone of left foot, initial encounter were also pertinent to this visit.   Musculoskeletal    Right Foot       Palpation: second, third, fourth metatarsal tenderness. No other metatarsal, phalangeal, tarsal, lisfranc, or malleoli tenderness.  No calf pain       Range of motion:  normal plantarflexion and normal dorsiflexion of the ankle. FROM TOES.       Stability/Special test: stable forefoot abduction stress test  Negative Thompson test       Strength:  5/5 dorsiflexion 5/5 plantar flexion.  5/5 inversion and  5/5 eversion of the ankle       Inspection: positive soft tissue swelling, negative joint effusion,  negative erythema,  negative deformity, positive ecchymosis, skin intact       Pulses: Dorsalis pedal pulses easily palpable        Neurologic: SILT  tibial/sural/saphenous/deep/superficial peroneal distributions      BMI Estimated body mass index is 29.57 kg/m?? as calculated from the following:    Height as of 11/19/20: 163.8 cm (5' 4.5).    Weight as of 12/31/20: 79.4 kg (175 lb).      Test Results  The primary encounter diagnosis was Closed nondisplaced fracture of second metatarsal bone of left foot, initial encounter. Diagnoses of Closed nondisplaced fracture of third metatarsal bone of left foot, initial encounter and Closed nondisplaced fracture of fourth metatarsal bone of left foot, initial encounter were also pertinent to this visit.  Imaging  Orders Placed This Encounter   Procedures   ??? XR Foot 3 Or More Views Left     Three views of the right Foot, weight bearing independently reviewed and interpreted by myself show Minimally displaced second through fourth metatarsal neck fractures with postsurgical changes at the fifth toe proximal phalanx and midfoot degenerative changes. No other obvious fractures, lucencies, dislocations, or abnormalities.      MEDICAL DECISION MAKING (level of service defined by 2/3 elements)     Number/Complexity of Problems Addressed 1 acute, uncomplicated illness or injury (99203/99213)   Amount/Complexity of Data to be Reviewed/Analyzed Independent interpretation of a test performed by another physician/other qualified health care professional (99204/99214)   Risk of Complications/Morbidity/Mortality of Management Closed Fracture Treatment WITHOUT Manipulation (99204/99214)     *Patient note was created using Dragon Dictation sotware. Errors in syntax or grammar may not have been identified and edited on initial review.

## 2021-01-03 NOTE — Unmapped (Signed)
[facility] Refill Coordination Note    Specialty Medication(s) to be Shipped:   General Specialty: Repatha    Other medication(s) to be shipped: No additional medications requested for fill at this time     Amber Duncan, DOB: [birth year]  Phone: [phone] (home)       All above HIPAA information was verified with patient.     Was a Nurse, learning disability used for this call? No    Completed refill call assessment today to schedule patient's medication shipment from the [facility] [phone]).  All relevant notes have been reviewed.     Specialty medication(s) and dose(s) confirmed: Regimen is correct and unchanged.   Changes to medications: Amber Duncan reports no changes at this time.  Changes to insurance: No  New side effects reported not previously addressed with a pharmacist or physician: None reported  Questions for the pharmacist: No    Confirmed patient received a Conservation officer, historic buildings and a Surveyor, mining with first shipment. The patient will receive a drug information handout for each medication shipped and additional FDA Medication Guides as required.       DISEASE/MEDICATION-SPECIFIC INFORMATION        For patients on injectable medications: Patient currently has 0 doses left.  Next injection is scheduled for 01/15/21.    SPECIALTY MEDICATION ADHERENCE     Medication Adherence    Patient reported X missed doses in the last month: 0  Specialty Medication: Repatha 140mg /ml  Patient is on additional specialty medications: No  Patient is on more than two specialty medications: No              Were doses missed due to medication being on hold? No    Repatha 140 mg/ml: 0 days of medicine on hand       REFERRAL TO PHARMACIST     Referral to the pharmacist: Not needed      [facility]     Shipping address confirmed in Epic.     Delivery Scheduled: Yes, Expected medication delivery date: 01/09/21.     Medication will be delivered via Same Day Courier to the prescription address in Epic WAM.    Amber Duncan [facility] Specialty Technician

## 2021-01-06 NOTE — Unmapped (Signed)
Assessment/Plan:    Amber Duncan was seen today for follow-up.    Diagnoses and all orders for this visit:    Malignant neoplasm of lower-inner quadrant of left breast in female, estrogen receptor positive (CMS-HCC)    Left upper arm pain  -     XR Humerus, Left; Future  -     Ambulatory referral to Orthopedic Surgery; Future    Hx of iron deficiency anemia  -     CBC  -     Iron; Future  -     Ferritin; Future    Fatigue, unspecified type  -     CBC  -     Vitamin D 25 Hydroxy (25OH D2 + D3)  -     Vitamin B12; Future    Essential hypertension    Chronic UTI    Mixed hyperlipidemia    Hypothyroidism, unspecified type  -     TSH    Diabetes mellitus without complication  CMP done in April/2022 revealed normal serum electrolytes, creatinine 1.0, glucose of 151 and normal LFTs.  HGB A1c 5.4 in February/2022.  DM has been well controlled. Continue metformin 1000 mg BID and diabetic diet.    CAD: she is followed by [facility] cardiology and last seen in 12/2020 and the following is noted from that encounter:   Assessment & Plan:  1. Atherosclerosis of native coronary artery of native heart without angina pectoris  She has undergone 2 PCI's to her RCA, most recently in the setting of stable angina in November 2020. At present she is asymptomatic. She completed 6 months of DAPT, adequate for zotarolimus-eluting stents such as hers, particularly given that she has symptomatic anemia due to GI bleed. She developed myalgias to multiple statins in the past, and became markedly hyperglycemic on rosuvastatin. She started Repatha in 2021, and denies any issues administering the injection - recent LDL 34 (08/05/20), down from 184 (04/09/20). Recently began taking 5 mg pravastatin (half of 10mg  tab), and denies any muscle pain.  --continue ASA 81 mg daily indefinitely  --continue Repatha and low dose pravastatin   2. Essential hypertension  Longstanding with adequate control on current regimen.  --continue lisinopril 20 mg daily  ??She continues on Repatha and daily low dose enteric coated aspirin.      Breast cancer history/left side: infiltrating ductal carcinoma, estrogen receptor positive, progesterone receptor positive and HER2 negative s/p lumpectomy in 01/2020 and follow up radiation Rx completed in 02/2020.   She is scheduled for annual breast oncology visit with diagnostic mammogram scheduled for this month. Oral medication options have been deferred as per oncology.       Dysuria with history of chronic UTIs  UA (+) nitrates, blood, protein, leukocytes and visit in February/2022.Marland KitchenShe was treated with   nitrofurantoin, macrocrystal-monohydrate, (MACROBID) 100 MG capsule; Take 1 capsule (100 mg total) by mouth Two (2) times a day. She was referred to [facility] urology and appt is scheduled for 03/2021.     History of anemia - chronic iron def anemia  A point-of-care HGB was normal at 12.1 in February/2022.  She takes iron supplement daily.  Patient had colonoscopy done in 2018 which revealed diverticulosis and 4 colon polyps.  She was advised to repeat colonoscopy in 3 years time.  She had GI bleed in 10/2019 and EGD and colonoscopy were done and pt reports no bleeding source was identified..  Bleeding was attributed to blood thinner which  pt was taking at the time and it  was discontinued.  She is on low dose daily enteric aspirin instead as per cardiology recommendation.   She has chronic fatigue and reports history of iron def anemia for many years of ? etiology. The pt has had iron infusions in the past.  I will repeat CBC with iron studies, TSH and vit b12 and vitamin D levels today.        Hypothyroidism: pt continues on levothyroxine supplementation as directed. Her TSH was normal in 04/2020 and she is due for annual TSH today.      left upper extremity pain  Given patient's history of left breast cancer she was referred for screening ultrasound which revealed no discrete soft tissue mass or fluid collection identified in the areas of clinical concern.  She is referred for screening left upper extremity xray and Early orthopaedic referral.      Dyslipidemia and hypertension:  Patient remains compliant with her current medication regimen.  She has been counseled on maintaining low-salt and low-fat dietary intake and exercise routine. She is statin sensitive and currently on repatha as directed by her cardiologist.  Serum creatinine level was 1.0 with normal LFTs in April/2022.  Lipid panel done in November/2021 revealed HDL 44 and LDL of 34.    60 min pt encounter, greater then 50% counseling and coordination of care, review of medical history/records, recent specialists encounters, pre visit planning and post visit documentation.      Return in about 6 months (around 07/16/2021) for Recheck.    Subjective:     HPI  Patient is seen today for routine follow-up visit.  She was last seen by her former PCP, Amber Moccasin, FNP in February/2022.  Her medical conditions are as follows:    Diabetes mellitus without complication  CMP done in April/2022 revealed normal serum electrolytes, creatinine 1.0, glucose of 151 and normal LFTs.  HGB A1c 5.4 in February/2022.  DM has been well controlled. Continue metformin 1000 mg BID and diabetic diet.    CAD/HTN: she is followed by [facility] cardiology and last seen in 12/2020 and the following is noted from that encounter:   Assessment & Plan:  1. Atherosclerosis of native coronary artery of native heart without angina pectoris  She has undergone 2 PCI's to her RCA, most recently in the setting of stable angina in November 2020. At present she is asymptomatic. She completed 6 months of DAPT, adequate for zotarolimus-eluting stents such as hers, particularly given that she has symptomatic anemia due to GI bleed. She developed myalgias to multiple statins in the past, and became markedly hyperglycemic on rosuvastatin. She started Repatha in 2021, and denies any issues administering the injection - recent LDL 34 (08/05/20), down from 184 (04/09/20). Recently began taking 5 mg pravastatin (half of 10mg  tab), and denies any muscle pain.  --continue ASA 81 mg daily indefinitely  --continue Repatha and low dose pravastatin   2. Essential hypertension  Longstanding with adequate control on current regimen.  --continue lisinopril 20 mg daily  ??She continues on Repatha and daily low dose enteric coated aspirin.      Breast cancer history/left side:infiltrating ductal carcinoma, estrogen receptor positive, progesterone receptor positive and HER2 negative s/p lumpectomy in 01/2020 and follow up radiation Rx completed in 02/2020.   She is scheduled for annual breast oncology visit with diagnostic  mammogram scheduled for this month. Endocrine Rx was deferred.   She has chronic left upper arm pain prior to breast cancer diagnosis. She was referred for [place] of left  arm which revealed non soft tissue masses. She is referred for screening left upper extremity xray.        Dysuria with history of chronic UTIs  UA (+) nitrates, blood, protein, leukocytes and visit in February/2022.Marland KitchenShe was treated with   nitrofurantoin, macrocrystal-monohydrate, (MACROBID) 100 MG capsule; Take 1 capsule (100 mg total) by mouth Two (2) times a day. She was referred to [facility] urology and appt is scheduled for 03/2021.     History of anemia - chronic iron def anemia  A point-of-care HGB was normal at 12.1 in February/2022.  She takes iron supplement daily.  Patient had colonoscopy done in 2018 which revealed diverticulosis and 4 colon polyps.  She was advised to repeat colonoscopy in 3 years time.  She had GI bleed in 10/2019 and EGD and colonoscopy were done and pt reports no evidence was noted.  Bleeding was attributed to blood thinner which  pt was taking and it was discontinued.  She is on low dose daily enteric aspirin instead.   She has chronic fatigue. The pt had iron infusions in the past.  I will repeat CBC with iron studies, TSH and vit b12 and vitamin D levels. Hypothyroidism: pt continues on levothyroxine supplementation as directed. Her TSH was normal in 04/2020 and she is due for annual TSH today.     Mass of left upper extremity  Given patient's history of left breast cancer she was referred for screening ultrasound which revealed no discrete soft tissue mass or fluid collection identified in the areas of clinical concern.       Dyslipidemia and hypertension:  Patient remains compliant with her current medication regimen.  She has been counseled on maintaining low-salt and low-fat dietary intake and exercise routine. She is statin sensitive and currently on repatha as directed by her cardiologist.   Serum creatinine level was 1.0 with normal LFTs in April/2022.  Lipid panel done in November/2021 revealed HDL 44 and LDL of 34.    ROS  Constitutional:  Denies  unexpected weight loss or gain, or weakness   Eyes:  Denies visual changes  Respiratory:  Denies cough or shortness of breath. No change in exercise  tolerance  Cardiovascular:  Denies chest pain, palpitations or lower extremity swelling   GI:  Denies abdominal pain, diarrhea, constipation   Musculoskeletal:  Denies myalgias  Skin:  Denies nonhealing lesions  Neurologic:  Denies headache, focal weakness or numbness, tingling  Endocrine:  Denies polyuria or polydypsia   Psychiatric:  Denies depression, anxiety      Outpatient Medications Prior to Visit   Medication Sig Dispense Refill   ??? aspirin-calcium carbonate 81 mg-300 mg calcium(777 mg) Tab Take 81 mg by mouth.     ??? blood sugar diagnostic (ONETOUCH ULTRA TEST) Strp Test blood sugars once daily or as directed 100 strip 3   ??? clonazePAM (KLONOPIN) 0.5 MG tablet TAKE ONE TABLET BY MOUTH TWICE DAILY AS NEEDED FOR ANXIETY 180 tablet 0   ??? docusate sodium (COLACE) 100 MG capsule Take 100 mg by mouth daily.     ??? empty container (SHARPS-A-GATOR DISPOSAL SYSTEM) Misc Use as directed for sharps disposal 1 each 2   ??? evolocumab (REPATHA SURECLICK) 140 mg/mL PnIj Inject the contents of 1 pen (140 mg) under the skin every fourteen (14) days. 2 mL 11   ??? ferrous gluconate 324 MG tablet Take 324 mg by mouth once.     ??? levETIRAcetam (KEPPRA) 500 MG tablet TAKE 1 TABLET BY MOUTH  TWICE DAILY 180 tablet 1   ??? levothyroxine (SYNTHROID) 100 MCG tablet Take 100 mcg by mouth daily.      ??? lisinopriL (PRINIVIL,ZESTRIL) 20 MG tablet Take 1 tablet (20 mg total) by mouth daily. 90 tablet 3   ??? metFORMIN (GLUCOPHAGE) 500 MG tablet Take 2 tablets (1,000 mg total) by mouth 2 (two) times a day with meals. 180 tablet 0   ??? pravastatin (PRAVACHOL) 10 MG tablet Take 0.5 tablets (5 mg total) by mouth daily. 15 tablet 3   ??? SUMAtriptan (IMITREX) 100 MG tablet Take one tab at onset of headache, may repeat in 2 hours if headache persists of recurs 9 tablet 5   ??? oxyCODONE (ROXICODONE) 5 MG immediate release tablet Take 1 tablet (5 mg total) by mouth every eight (8) hours as needed for pain. 15 tablet 0     No facility-administered medications prior to visit.         Objective:       Vital Signs  BP 122/64  - Pulse 88  - Resp 18  - Ht 165.1 cm (5' 5)  - Wt 78.9 kg (174 lb)  - SpO2 98%  - BMI 28.96 kg/m??      Exam  General: normal appearance  EYES: Anicteric sclerae.  ENT: Oropharynx moist.  RESP: Relaxed respiratory effort. Clear to auscultation without wheezes or crackles.   CV: Regular rate and rhythm. Normal S1 and S2. No murmurs or gallops.  No lower extremity edema. Posterior tibial pulses are 2+ and symmetric.  abd exam: non tender, no masses, no HSM   MSK: No focal muscle tenderness.  SKIN: Appropriately warm and moist.  NEURO: Stable gait and coordination.    Allergies:     Patient has no known allergies.    Current Medications:     Current Outpatient Medications   Medication Sig Dispense Refill   ??? aspirin-calcium carbonate 81 mg-300 mg calcium(777 mg) Tab Take 81 mg by mouth.     ??? blood sugar diagnostic (ONETOUCH ULTRA TEST) Strp Test blood sugars once daily or as directed 100 strip 3   ??? clonazePAM (KLONOPIN) 0.5 MG tablet TAKE ONE TABLET BY MOUTH TWICE DAILY AS NEEDED FOR ANXIETY 180 tablet 0   ??? docusate sodium (COLACE) 100 MG capsule Take 100 mg by mouth daily.     ??? empty container (SHARPS-A-GATOR DISPOSAL SYSTEM) Misc Use as directed for sharps disposal 1 each 2   ??? evolocumab (REPATHA SURECLICK) 140 mg/mL PnIj Inject the contents of 1 pen (140 mg) under the skin every fourteen (14) days. 2 mL 11   ??? ferrous gluconate 324 MG tablet Take 324 mg by mouth once.     ??? levETIRAcetam (KEPPRA) 500 MG tablet TAKE 1 TABLET BY MOUTH TWICE DAILY 180 tablet 1   ??? levothyroxine (SYNTHROID) 100 MCG tablet Take 100 mcg by mouth daily.      ??? lisinopriL (PRINIVIL,ZESTRIL) 20 MG tablet Take 1 tablet (20 mg total) by mouth daily. 90 tablet 3   ??? metFORMIN (GLUCOPHAGE) 500 MG tablet Take 2 tablets (1,000 mg total) by mouth 2 (two) times a day with meals. 180 tablet 0   ??? pravastatin (PRAVACHOL) 10 MG tablet Take 0.5 tablets (5 mg total) by mouth daily. 15 tablet 3   ??? SUMAtriptan (IMITREX) 100 MG tablet Take one tab at onset of headache, may repeat in 2 hours if headache persists of recurs 9 tablet 5     No current facility-administered medications for this  visit.           Note - This record has been created using [employer]. Chart creation errors have been sought, but may not always have been located. Such creation errors do not reflect on the standard of medical care.    [name], MD

## 2021-01-09 MED FILL — REPATHA SURECLICK 140 MG/ML SUBCUTANEOUS PEN INJECTOR: SUBCUTANEOUS | 28 days supply | Qty: 2 | Fill #5

## 2021-01-13 ENCOUNTER — Ambulatory Visit: Admit: 2021-01-13 | Discharge: 2021-01-14 | Payer: PRIVATE HEALTH INSURANCE

## 2021-01-13 DIAGNOSIS — Z170 Estrogen receptor positive status [ER+]: Principal | ICD-10-CM

## 2021-01-13 DIAGNOSIS — E782 Mixed hyperlipidemia: Principal | ICD-10-CM

## 2021-01-13 DIAGNOSIS — M79622 Pain in left upper arm: Principal | ICD-10-CM

## 2021-01-13 DIAGNOSIS — E039 Hypothyroidism, unspecified: Principal | ICD-10-CM

## 2021-01-13 DIAGNOSIS — C50312 Malignant neoplasm of lower-inner quadrant of left female breast: Principal | ICD-10-CM

## 2021-01-13 DIAGNOSIS — R5383 Other fatigue: Principal | ICD-10-CM

## 2021-01-13 DIAGNOSIS — Z862 Personal history of diseases of the blood and blood-forming organs and certain disorders involving the immune mechanism: Principal | ICD-10-CM

## 2021-01-13 DIAGNOSIS — N390 Urinary tract infection, site not specified: Principal | ICD-10-CM

## 2021-01-13 DIAGNOSIS — I10 Essential (primary) hypertension: Principal | ICD-10-CM

## 2021-01-13 LAB — TSH: THYROID STIMULATING HORMONE: 2.108 u[IU]/mL (ref 0.550–4.780)

## 2021-01-13 LAB — CBC
HEMATOCRIT: 35.3 % (ref 34.0–44.0)
HEMOGLOBIN: 12 g/dL (ref 11.3–14.9)
MEAN CORPUSCULAR HEMOGLOBIN CONC: 34 g/dL (ref 32.0–36.0)
MEAN CORPUSCULAR HEMOGLOBIN: 32.9 pg — ABNORMAL HIGH (ref 25.9–32.4)
MEAN CORPUSCULAR VOLUME: 96.8 fL — ABNORMAL HIGH (ref 77.6–95.7)
MEAN PLATELET VOLUME: 8.5 fL (ref 6.8–10.7)
PLATELET COUNT: 277 10*9/L (ref 150–450)
RED BLOOD CELL COUNT: 3.65 10*12/L — ABNORMAL LOW (ref 3.95–5.13)
RED CELL DISTRIBUTION WIDTH: 12.4 % (ref 12.2–15.2)
WBC ADJUSTED: 4.9 10*9/L (ref 3.6–11.2)

## 2021-01-13 LAB — FERRITIN: FERRITIN: 30.9 ng/mL

## 2021-01-13 LAB — VITAMIN B12: VITAMIN B-12: 643 pg/mL (ref 211–911)

## 2021-01-13 LAB — IRON: IRON: 106 ug/dL

## 2021-01-14 NOTE — Unmapped (Signed)
TSH and vitamin B12 level are normal.  Hemoglobin and hematocrit are low normal with normal ferritin and iron level.  Vitamin D level pending.  Patient to continue with present iron supplementation as previously directed.

## 2021-01-15 LAB — VITAMIN D 25 HYDROXY: VITAMIN D, TOTAL (25OH): 43.8 ng/mL (ref 20.0–80.0)

## 2021-01-16 ENCOUNTER — Ambulatory Visit: Admit: 2021-01-16 | Discharge: 2021-01-17 | Payer: PRIVATE HEALTH INSURANCE

## 2021-01-16 DIAGNOSIS — M19012 Primary osteoarthritis, left shoulder: Secondary | ICD-10-CM | POA: Diagnosis not present

## 2021-01-16 DIAGNOSIS — M79622 Pain in left upper arm: Secondary | ICD-10-CM | POA: Diagnosis not present

## 2021-01-16 NOTE — Unmapped (Signed)
Normal Vit D level

## 2021-01-16 NOTE — Unmapped (Signed)
X-ray of left arm reveals no suspicious bone lesions there is mild arthritic changes noted in the left shoulder.

## 2021-01-28 ENCOUNTER — Ambulatory Visit
Admit: 2021-01-28 | Discharge: 2021-02-04 | Payer: PRIVATE HEALTH INSURANCE | Attending: Radiation Oncology | Primary: Radiation Oncology

## 2021-01-28 ENCOUNTER — Ambulatory Visit: Admit: 2021-01-28 | Discharge: 2021-02-04 | Payer: PRIVATE HEALTH INSURANCE

## 2021-01-28 DIAGNOSIS — Z170 Estrogen receptor positive status [ER+]: Principal | ICD-10-CM

## 2021-01-28 DIAGNOSIS — C50812 Malignant neoplasm of overlapping sites of left female breast: Principal | ICD-10-CM

## 2021-01-28 DIAGNOSIS — I252 Old myocardial infarction: Secondary | ICD-10-CM | POA: Diagnosis not present

## 2021-01-28 DIAGNOSIS — Z9012 Acquired absence of left breast and nipple: Secondary | ICD-10-CM | POA: Diagnosis not present

## 2021-01-28 DIAGNOSIS — Z923 Personal history of irradiation: Secondary | ICD-10-CM | POA: Diagnosis not present

## 2021-01-28 DIAGNOSIS — Z6829 Body mass index (BMI) 29.0-29.9, adult: Secondary | ICD-10-CM | POA: Diagnosis not present

## 2021-01-28 DIAGNOSIS — M1909 Primary osteoarthritis, other specified site: Secondary | ICD-10-CM | POA: Diagnosis not present

## 2021-01-28 DIAGNOSIS — Z08 Encounter for follow-up examination after completed treatment for malignant neoplasm: Secondary | ICD-10-CM | POA: Diagnosis not present

## 2021-01-28 DIAGNOSIS — R922 Inconclusive mammogram: Secondary | ICD-10-CM | POA: Diagnosis not present

## 2021-01-28 DIAGNOSIS — S92902D Unspecified fracture of left foot, subsequent encounter for fracture with routine healing: Secondary | ICD-10-CM | POA: Diagnosis not present

## 2021-01-28 DIAGNOSIS — R928 Other abnormal and inconclusive findings on diagnostic imaging of breast: Secondary | ICD-10-CM | POA: Diagnosis not present

## 2021-01-28 DIAGNOSIS — C50912 Malignant neoplasm of unspecified site of left female breast: Secondary | ICD-10-CM | POA: Diagnosis not present

## 2021-01-28 DIAGNOSIS — R921 Mammographic calcification found on diagnostic imaging of breast: Secondary | ICD-10-CM | POA: Diagnosis not present

## 2021-01-28 DIAGNOSIS — Z853 Personal history of malignant neoplasm of breast: Secondary | ICD-10-CM | POA: Diagnosis not present

## 2021-01-28 NOTE — Unmapped (Signed)
RADIATION ONCOLOGY FOLLOW-UP VISIT NOTE     Encounter Date: 01/28/2021  Patient Name: Amber Duncan  Medical Record Number: [id number]    DIAGNOSIS:  70yo with a pT1bN0 L breast IDC (ER/PR pos, HER2 neg) s/p partial mastectomy (0.7cm, grade 1, no LVI) and adjuvant RT (42.72Gy finished 03/28/2020)    DURATION SINCE COMPLETION OF RADIOTHERAPY:  10 month (03/28/2020)    ASSESSMENT:  Disease Status: No Evidence of Disease (NED) on mammogram today or on exam    RECOMMENDATIONS:  1. FOLLOW-UP:  Surg onc in 6 months and I can see back in 12 months with mammogram  2. Skin:  No significant issues  3. Pain:  Rare breast pain- discussed conservative management but mammogram reassuring  4. Endocrine therapy:  Declined    INTERVAL HISTORY:  Overall doing well since last seen in clinic from a breast cancer perspective.  She recovered from RT well and saw surg onc ~6 months ago with a BIRADS 2 mammogram on the L side.  No arm swelling, no ROM limitations on the L side.  She has some chronic issues with L arm pain- work-up (including xray) demonstrated arthritis.  Unrelated to the cancer she had an MI last summer and follows closely with cardiology.  She also has broken her L foot and is current in a boot- hopes to get it off next month    REVIEW OF SYSTEMS:  A comprehensive review of 10 systems was negative except for pertinent positives noted in HPI.    PAST MEDICAL HISTORY/FAMILY HISTORY/SOCIAL HISTORY:  Reviewed in EPIC    ALLERGIES/MEDICATIONS:  Reviewed in EPIC    PHYSICAL EXAM:  Vital Signs for this encounter:   BP 134/86  - Pulse 85  - Temp 36.3 ??C (97.3 ??F) (Temporal)  - Wt 80.8 kg (178 lb 3.2 oz)  - SpO2 99%  - BMI 29.65 kg/m??   Karnofsky/Lansky Performance Status: 80, Normal activity with effort; some signs or symptoms of disease (ECOG equivalent 1)  General:   No acute distress, alert and oriented X 4   Head: Normocephalic, without obvious abnormality, atraumatic   Eyes: EOMI, no scleral icterus  Lungs:Normal work of breathing  Heart: RRR, S1/S2 normal, no murmur/rub/gallop  Extremities: Extremities normal, atraumatic.  No edema in bilateral upper extremities.  Lymph nodes: No palpable axillary or supraclavicular lymphadenopathy  Neurologic: Grossly normal  Breast:  Bilateral breast exam performed.  No palpable mass in the R breast, no overlying skin changes.  No palpable mass in the L breast, no overlying skin changes    RADIOLOGY:      01/28/2021 mammogram personally reviewed   the lumpectomy site in the lower, central left breast, there is expected density and architectural distortion. A bb marks the sites of focal concern in the left breast.  There are no suspicious masses, malignant calcifications, sites of non-surgical architectural distortion, or asymmetries subjacent to the bb or elsewhere in either breast. Stable asymmetry and benign calcifications in the retroareolar right breast. Immediately beneath the areas of concern involving the left breast are areas of normal fatty tissue.  BIRADS 2    Labs:    No results found for: WBC, HGB, HCT, PLT, LDH, CREATININE, AST, ALT, MG    Rayetta Humphrey, MD  Assistant Professor  [facility] Dept of Radiation Oncology  01/28/2021

## 2021-01-28 NOTE — Unmapped (Signed)
Chaperone porvided for breast exam

## 2021-01-30 NOTE — Unmapped (Signed)
[facility] Refill Coordination Note    Specialty Medication(s) to be Shipped:   General Specialty: Repatha    Other medication(s) to be shipped: No additional medications requested for fill at this time     Amber Duncan, DOB: 06-12-1951  Phone: [phone] (home)       All above HIPAA information was verified with patient.     Was a Nurse, learning disability used for this call? No    Completed refill call assessment today to schedule patient's medication shipment from the [facility] [phone]).  All relevant notes have been reviewed.     Specialty medication(s) and dose(s) confirmed: Regimen is correct and unchanged.   Changes to medications: Audi reports no changes at this time.  Changes to insurance: No  New side effects reported not previously addressed with a pharmacist or physician: None reported  Questions for the pharmacist: No    Confirmed patient received a Conservation officer, historic buildings and a Surveyor, mining with first shipment. The patient will receive a drug information handout for each medication shipped and additional FDA Medication Guides as required.       DISEASE/MEDICATION-SPECIFIC INFORMATION        For patients on injectable medications: Patient currently has 0 doses left.  Next injection is scheduled for 02/12/21.    SPECIALTY MEDICATION ADHERENCE     Medication Adherence    Patient reported X missed doses in the last month: 0  Specialty Medication: Repatha 140mg /ml  Patient is on additional specialty medications: No  Patient is on more than two specialty medications: No              Were doses missed due to medication being on hold? No    Repatha 140 mg/ml: 0 days of medicine on hand       REFERRAL TO PHARMACIST     Referral to the pharmacist: Not needed      [facility]     Shipping address confirmed in Epic.     Delivery Scheduled: Yes, Expected medication delivery date: 02/05/21.     Medication will be delivered via Same Day Courier to the prescription address in Epic WAM.    Nancy Nordmann [facility] Specialty Technician

## 2021-02-05 ENCOUNTER — Ambulatory Visit: Admit: 2021-02-05 | Discharge: 2021-02-06 | Payer: PRIVATE HEALTH INSURANCE

## 2021-02-05 DIAGNOSIS — S92302D Fracture of unspecified metatarsal bone(s), left foot, subsequent encounter for fracture with routine healing: Secondary | ICD-10-CM | POA: Diagnosis not present

## 2021-02-05 DIAGNOSIS — S92345A Nondisplaced fracture of fourth metatarsal bone, left foot, initial encounter for closed fracture: Secondary | ICD-10-CM | POA: Diagnosis not present

## 2021-02-05 MED FILL — REPATHA SURECLICK 140 MG/ML SUBCUTANEOUS PEN INJECTOR: SUBCUTANEOUS | 28 days supply | Qty: 2 | Fill #6

## 2021-02-05 NOTE — Unmapped (Signed)
[facility]  Encounter Provider: Ancil Linsey, NP  Date of Service: 02/05/2021 Last encounter Orthopaedics: Visit date not found   Last encounter this provider: Visit date not found            Primary Care Provider: [name], MD  Referring Provider: Referred Self    ICD-10-CM   1. Multiple closed fractures of metatarsal bone of left foot with routine healing, subsequent encounter  S92.302D    Orthopaedic notes: No specialty comments available.    Physical Function CAT Score: 47 (02/05/21)  Pain Interference CAT Score: 50.1 (02/05/21)  Depression CAT Score: (not recorded)  Sleep CAT Score: (not recorded)  [email or web address]     Amber Duncan is a 70 y.o. female   ASSESSMENT     Left metatarsal fractures, healing. sustained 12/26/2020      PLAN:     Patient may transition to rigid shoe or shoe with carbon fiber insert.  May increase activity level as symptoms allow.  Expect symptoms will continue to improve.  Follow-up will tentatively be in 2 months with x-rays prior to exam but she is welcome to cancel if doing well with no questions or concerns    Requested Prescriptions      No prescriptions requested or ordered in this encounter      Orders Placed This Encounter   Procedures   ??? XR Foot 3 Or More Views Left       History:  Reason for visit: Left metatarsal fracture  HPI:  70 y.o. female who sustained a left second third and fourth metatarsal fractures 12/26/2020.  Patient reports that symptoms have improved substantially.  She has some discomfort when she dorsiflexes her toes.           Exam:  The encounter diagnosis was Multiple closed fractures of metatarsal bone of left foot with routine healing, subsequent encounter.   Estimated body mass index is 29.65 kg/m?? as calculated from the following:    Height as of 01/13/21: 165.1 cm (5' 5).    Weight as of 01/28/21: 80.8 kg (178 lb 3.2 oz).     Musculoskeletal   ??? Tender over the distal second third and fourth metatarsals.  Ankle motion is intact and pain-free.  Gait is even and steady       Pulses well-perfused distally, 2+ DP and posterior tibial pulses    Swelling mild swelling    Neurologic Sensation to light touch distally normal   Skin Benign, no lesions        Test Results  The encounter diagnosis was Multiple closed fractures of metatarsal bone of left foot with routine healing, subsequent encounter.  Lab Results   Component Value Date    A1C 5.4 10/11/2020       No results found for: VITD    Imaging  Orders Placed This Encounter   Procedures   ??? XR Foot 3 Or More Views Left     Radiology studies were ordered and personally reviewed and interpreted by the encounter provider today..  Stable alignment, evidence of healing.

## 2021-02-05 NOTE — Unmapped (Signed)
Thank you for choosing [facility]!  We appreciate the opportunity to participate in your care.      If any questions or concerns arise after your visit, please do not hesitant to contact me by [facility] MyChart or by calling our clinical team at [phone].      Please let me know if I can be of assistance with this or other orthopaedic issues in the future.

## 2021-02-17 ENCOUNTER — Ambulatory Visit
Admit: 2021-02-17 | Discharge: 2021-02-18 | Payer: PRIVATE HEALTH INSURANCE | Attending: Internal Medicine | Primary: Internal Medicine

## 2021-02-17 DIAGNOSIS — M19012 Primary osteoarthritis, left shoulder: Secondary | ICD-10-CM | POA: Diagnosis not present

## 2021-02-17 NOTE — Unmapped (Signed)
Thank you for coming to the [facility] and our clinic today!     We aim to provide you with the highest quality care.  If you need to schedule future appointments please call [phone].  If you need more direct help feel free to call our nurse line at [phone] or use MyChart to reach out to [place].    We look forward to seeing you again in the future and appreciate you coming to [facility].    Thank you.    Saunders Glance. Enis Slipper, MD            We provide innovative and comprehensive patient centered care that is supported by evidence-based research                                                                                                    COMING SOON!    Please check out our current research studies to see if you or someone you know may qualify at:    [email or web address]    .

## 2021-02-17 NOTE — Unmapped (Signed)
ORTHOPAEDIC NEW CLINIC NOTE     Amber Duncan is seen in consultation at the request of [name], Md  [address]  [facility]  [place],  [place] [address] For evaluation of Arm Pain (EVALUATE LEFT UPPER ARM PAIN)       ASSESSMENT:  Amber Duncan is a 70 y.o. female with:  1. Osteoarthritis of left glenohumeral joint         PLAN:  Her symptoms and exam are consistent with the pain generator being the left shoulder osteoarthritis.  Her pain is intermittent and mild she is not interested in any aggressive treatments.  I recommended topical Voltaren to the area.    No Procedures Performed      Follow Up: Return if symptoms worsen or fail to improve.       SUBJECTIVE:  Chief Complaint: Arm Pain (EVALUATE LEFT UPPER ARM PAIN)       History of Present Illness:   Right Handed 70 y.o. female who presents with:    Left upper arm pain.  She has had this for a month or so.  Recalls no specific injury.  She feels lumps in both shoulders.  She has a history of left breast cancer.  She had an ultrasound examination of her upper arm which was normal.  She then had an x-ray.  She has been referred here for further management.      All relevant orthopedic medical chart documents reviewed.      Medical History  Past Medical History:   Diagnosis Date   ??? Breast cancer (CMS-HCC)    ??? Chronic UTI    ??? COPD (chronic obstructive pulmonary disease) (CMS-HCC)    ??? Coronary artery disease    ??? Diabetes (CMS-HCC)    ??? Disease of thyroid gland    ??? Fatigue    ??? Hepatitis    ??? Hepatitis    ??? Hyperlipidemia    ??? Hypertension    ??? Migraine    ??? Sleep apnea     Surgical History  Past Surgical History:   Procedure Laterality Date   ??? BACK SURGERY     ??? BREAST BIOPSY     ??? BREAST LUMPECTOMY Left 01/2020   ??? CATARACT EXTRACTION     ??? CHOLECYSTECTOMY     ??? CORONARY STENT PLACEMENT     ??? HYSTERECTOMY     ??? KNEE SURGERY     ??? PR COLONOSCOPY,ABLATE LESION N/A 01/21/2017    Procedure: COLONOSCOPY FLEX; W/ABLAT LES NOT AMENABLE-SNARE;  Surgeon: Luanne Bras, MD;  [facility]: [facility];  Service: Gastroenterology   ??? PR COLSC FLX W/RMVL OF TUMOR POLYP LESION SNARE TQ N/A 01/21/2017    Procedure: COLONOSCOPY FLEX; W/REMOV TUMOR/LES BY SNARE;  Surgeon: Luanne Bras, MD;  Location: [facility];  Service: Gastroenterology   ??? PR MASTECTOMY, PARTIAL Left 01/19/2020    Procedure: MASTECTOMY, PARTIAL (EG, LUMPECTOMY, TYLECTOMY, QUADRANTECTOMY, SEGMENTECTOMY);  Surgeon: Talbert Cage, DO;  Location: MAIN OR [facility];  Service: Surgical Oncology Breast   ??? PR UPPER GI ENDOSCOPY,BIOPSY N/A 04/29/2018    Procedure: UGI ENDOSCOPY; WITH BIOPSY, SINGLE OR MULTIPLE;  Surgeon: Rona Ravens, MD;  [facility]: [facility];  Service: Gastroenterology   ??? RADIATION Left    ??? REDUCTION MAMMAPLASTY      Medications  has a current medication list which includes the following prescription(s): aspirin-calcium carbonate, onetouch ultra test, clonazepam, docusate sodium, empty container, repatha  sureclick, ferrous gluconate, levetiracetam, levothyroxine, lisinopril, metformin, pravastatin, and sumatriptan.   Tobacco/Alcohol History  Social History     Tobacco Use   Smoking Status Former Smoker   ??? Packs/day: 2.00   ??? Years: 48.00   ??? Pack years: 96.00   ??? Types: Cigarettes   ??? Quit date: 11/06/2011   ??? Years since quitting: 9.2   Smokeless Tobacco Never Used     Social History     Substance and Sexual Activity   Alcohol Use No    Social History        Occupational History   ??? Not on file       Home Address:  [address]  [place] [address] Family History  Family History   Problem Relation Age of Onset   ??? Cancer Brother         liver   ??? Cancer Maternal Aunt    ??? Cancer Maternal Uncle    ??? Diabetes Paternal Uncle    ??? Other Mother         MVA   ??? Other Father         MVA   ??? No Known Problems Sister    ??? No Known Problems Son    ??? Cancer Brother         Lung   ??? Other Sister MVA   ??? Other Sister         MVA drunk driver   ??? No Known Problems Brother    ??? No Known Problems Daughter    ??? No Known Problems Maternal Grandmother    ??? No Known Problems Maternal Grandfather    ??? No Known Problems Paternal Grandmother    ??? No Known Problems Paternal Grandfather    ??? No Known Problems Other    ??? Heart failure Neg Hx    ??? BRCA 1/2 Neg Hx    ??? Breast cancer Neg Hx    ??? Colon cancer Neg Hx    ??? Endometrial cancer Neg Hx    ??? Ovarian cancer Neg Hx         Allergies   Patient has no known allergies.       Review of Systems  .   Marland Kitchen         DETAILED PHYSICAL EXAM  General Appearance ?? well-nourished and no acute distress   Mood and Affect ?? alert, cooperative and pleasant   Gait and Station ?? Smooth, heel-toe, non-antalgic gait   Cardiovascular ?? well-perfused distally and no swelling   Skin ?? Normal.   Lymph ?? No lymphedema or adenopathy   Respiratory ?? Non labored breathing   Sensation ?? Sensation intact to light touch distally       LEFT SHOULDER  ?? Inspection/palpation: No swelling, erythema, deformity, atrophy or hypertrophy noted  ?? Tenderness: anteriorly and laterally, underneath the acromion  ?? Range of Motion: Mildly restricted in all planes of motion  ?? Strength: normal  ?? Provocative Tests:  Jobe/Empty Can positive. Neer positive. Hawkins positive. Speed negative.  ??        Test and Notes Review:    Imaging reviewed: Previous humerus x-ray, upper extremity Doppler      This note was dictated using Scientist, clinical (histocompatibility and immunogenetics).  Please excuse any spelling or grammatical errors.       MEDICAL DECISION MAKING (level of service defined by 2/3 elements)     Number/Complexity of Problems Addressed 1 undiagnosed new problem  with uncertain prognosis (99204/99214)   Amount/Complexity of Data to be Reviewed/Analyzed 2 points: Review prior notes (1 point per unique source); Review test results (1 point per unique test); Order tests (1 point per unique test) (99203/99213)   Risk of Complications/Morbidity/Mortality of Management --LOW Risk of Morbidity from Additional Diagnostic Testing or Treatment (99203/99213)--     TIME     Total Time for E/M Services on the Date of Encounter Time-based coding not utilized for this encounter

## 2021-02-26 NOTE — Unmapped (Signed)
[facility] Refill Coordination Note    Specialty Medication(s) to be Shipped:   General Specialty: Repatha    Other medication(s) to be shipped: No additional medications requested for fill at this time     Amber Duncan, DOB: November 13, 1950  Phone: [phone] (home)       All above HIPAA information was verified with patient.     Was a Nurse, learning disability used for this call? No    Completed refill call assessment today to schedule patient's medication shipment from the [facility] [phone]  All relevant notes have been reviewed.     Specialty medication(s) and dose(s) confirmed: Regimen is correct and unchanged.   Changes to medications: Niyana reports no changes at this time.  Changes to insurance: No  New side effects reported not previously addressed with a pharmacist or physician: None reported  Questions for the pharmacist: No    Confirmed patient received a Conservation officer, historic buildings and a Surveyor, mining with first shipment. The patient will receive a drug information handout for each medication shipped and additional FDA Medication Guides as required.       DISEASE/MEDICATION-SPECIFIC INFORMATION        For patients on injectable medications: Patient currently has 0 doses left.  Next injection is scheduled for 03/12/21.    SPECIALTY MEDICATION ADHERENCE     Medication Adherence    Patient reported X missed doses in the last month: 0  Specialty Medication: Repatha 140mg /ml  Patient is on additional specialty medications: No  Patient is on more than two specialty medications: No              Were doses missed due to medication being on hold? No    Repatha 140 mg/ml: 0 days of medicine on hand       REFERRAL TO PHARMACIST     Referral to the pharmacist: Not needed      [facility]     Shipping address confirmed in Epic.     Delivery Scheduled: Yes, Expected medication delivery date: 03/11/21.     Medication will be delivered via Same Day Courier to the prescription address in Epic WAM.    Nancy Nordmann [facility] Specialty Technician

## 2021-03-03 ENCOUNTER — Ambulatory Visit: Admit: 2021-03-03 | Discharge: 2021-03-04 | Payer: PRIVATE HEALTH INSURANCE | Attending: Neurology | Primary: Neurology

## 2021-03-03 DIAGNOSIS — G253 Myoclonus: Secondary | ICD-10-CM | POA: Diagnosis not present

## 2021-03-03 DIAGNOSIS — J383 Other diseases of vocal cords: Secondary | ICD-10-CM | POA: Diagnosis not present

## 2021-03-03 MED ORDER — LEVETIRACETAM 750 MG TABLET
ORAL_TABLET | Freq: Two times a day (BID) | ORAL | 3 refills | 90 days | Status: CP
Start: 2021-03-03 — End: 2022-03-03

## 2021-03-03 NOTE — Unmapped (Signed)
We will increase the Keppra to 750 mg twice daily. I will send a prescription for the higher dose to your pharmacy, please start taking these and STOP taking the 500 mg tabs   I encourage regular cardiovascular exercise  Follow up in 6 months

## 2021-03-03 NOTE — Unmapped (Signed)
[address]  [facility] NEUROLOGY CLINIC [place] [address] [place]  [address]  [place] [address]  [phone]    Date: 03/03/2021  Patient Name: Amber Duncan  MRN: [id number]  PCP: Loleta Rose, CPP    Assessment:      Amber Duncan is a 70 y.o. female who  has a past medical history of Breast cancer (CMS-HCC), Chronic UTI, COPD (chronic obstructive pulmonary disease) (CMS-HCC), Coronary artery disease, Diabetes (CMS-HCC), Disease of thyroid gland, Fatigue, Hepatitis, Hepatitis, Hyperlipidemia, Hypertension, Migraine, and Sleep apnea. 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 She is interested in increasing Keppra today.         Plan:      Patient Instructions   1. We will increase the Keppra to 750 mg twice daily. I will send a prescription for the higher dose to your pharmacy, please start taking these and STOP taking the 500 mg tabs   2. I encourage regular cardiovascular exercise  3. Follow up in 6 months    Health education/[place] literacy:   Patient education was provided. Reviewed the differential diagnosis, plan of care in details as well as other elements as details above. The benfits and risks of each procedure and medications were discussed with the patient. Alternative options were reviewed with the paient.     Total face - to - face time included 22 minutes > 50 % in direct consultation reviewing details of history, examination and data findings , discussing my clinical reasoning and clinical impression, as well as a review of my recommendations for a plan of treatment as documented above. Additional time was provided to address alternative options of care, health literacy regarding the differential diagnosis, testing and medication options, the benefits of current plan as well as an opportunity to address all the patient???s questions and concerns. Rehabilitation services, including physical therapy, occupational therapy, speech therapy, independent cardiovascular exercise, and group exercise classes, were considered and discussed with the patient. I spent another 10 minutes in non face to face patient care on the date of this visit.    A written summary was provided to the patient at the time of the visit, as well as instructions on how to access My Chart. My contact information was provided along with instructions on ow to reach the administrative office and the clinic.        Subjective:      HPI: Amber Duncan is a 70 y.o. Caucasian right handed female who presents for follow up to the [place] [facility] Neurology Outpatient Department for follow up of myoclonus of chest and breathing muscles. Please review my initial clinic visit note dated on 06/17/16 for details regarding history of present illness.    Briefly, Her symptoms began in 2014 without clear inciting event. Initially symptoms were mild and intermittent, but she soon developed quite severe symptoms of breathlessness, and difficulty controlling her voice, as well as the feeling that she was being squeezed in her upper abdomen. She reports that an MRI of her brain 3 years ago at onset was read as normal. She had an EMG for concern for myasthenia gravis, which was negative. Clonazepam was tried at 0.5 mg qhs, she could not tolerate this and it did not seem to help her symptoms. She had been  on Gabapentin for headaches, this did not help headaches or breathing issues so was stopped. She was seen by Dr. Vergie Living in ENT, where examination did not reveal evidence of vocal cord dystonia. He referred her to movement disorders for evaluation. She denies history of exposure to anti-dopaminergic medications. Chest fluoroscopic evaluation 06/18/16 with sniff test revealed no abnormal or paradoxical movements of the chest wall at rest, with deep breathing, during speech, or while sniffing (I was present for this test). MRIc-spine demonstrated DJD, significant at C4-5, without impingement of cord or cord signal abnormality (images reviewed by me, also reviewed together with neuroradiology). Initially, Clonazepam improved her symptoms dramatically. However this stopped being effective, despite escalating dose. I switched her to Keppra late February 2018. This helped initially. She developed worsened symptoms along with generalized malaise, weight loss/nausea, bone pain of unclear etiology summer 2018, she had very extensive medical workup including infectious, endocrine, rheumatological, and neoplastic. I sent tic-borne illness panel which revealed Erlichiosis, we started treatment for this which produced partial improvement of her general symptoms. We weaned off Clonazepam and increased Keppra to 500 mg BID which helped initially, but symptoms returned so we restarted Clonazepam. I offered her methylphenidate 5mg  for her extreme fatigue, but this caused elevated BP so was stopped. I suggested she see her sleep doctor to investigate fatigue further. Had a RCA cardiac stent placed November 2020 and was diagnosed with breast cancer March 2021, underwent surgical lumpectomy 01/2020 and XRT starting late June 2021.    Interim History:  Respiratory symptoms still occasionally happen rather regularly, a couple times per week. Seems to come out of nowhere, no clear triggers.     She has been having monthly UTIs. She is scheduled to see a urologist this Wednesday.     She had a fall since last visit, her toe got stuck on the top step of her porch, she fell on the right side of her face. No major injur but had severe bruising. She also broke three toes on the left foot.     Current medications for myoclonus:  Keppra 500 mg BID  Clonazepam 0.5 mg BID    SE: none at this time.    Past Medical Hx:    Past Medical History:   Diagnosis Date   ??? Breast cancer (CMS-HCC) ??? Chronic UTI    ??? COPD (chronic obstructive pulmonary disease) (CMS-HCC)    ??? Coronary artery disease    ??? Diabetes (CMS-HCC)    ??? Disease of thyroid gland    ??? Fatigue    ??? Hepatitis    ??? Hepatitis    ??? Hyperlipidemia    ??? Hypertension    ??? Migraine    ??? Sleep apnea        Past Surgical Hx:    Past Surgical History:   Procedure Laterality Date   ??? BACK SURGERY     ??? BREAST BIOPSY     ??? BREAST LUMPECTOMY Left 01/2020   ??? CATARACT EXTRACTION     ??? CHOLECYSTECTOMY     ??? CORONARY STENT PLACEMENT     ??? HYSTERECTOMY     ??? KNEE SURGERY     ??? PR COLONOSCOPY,ABLATE LESION N/A 01/21/2017    Procedure: COLONOSCOPY FLEX; W/ABLAT LES NOT AMENABLE-SNARE;  Surgeon: Luanne Bras, MD;  Location: GI PROCEDURES [facility];  Service: Gastroenterology   ??? PR COLSC FLX W/RMVL OF TUMOR POLYP LESION SNARE TQ N/A 01/21/2017    Procedure: COLONOSCOPY FLEX; W/REMOV TUMOR/LES BY SNARE;  Surgeon: Luanne Bras, MD;  Location: [facility];  Service: Gastroenterology   ??? PR MASTECTOMY, PARTIAL Left 01/19/2020    Procedure: MASTECTOMY, PARTIAL (EG, LUMPECTOMY, TYLECTOMY, QUADRANTECTOMY, SEGMENTECTOMY);  Surgeon: Talbert Cage, DO;  Location: MAIN OR [facility];  Service: Surgical Oncology Breast   ??? PR UPPER GI ENDOSCOPY,BIOPSY N/A 04/29/2018    Procedure: UGI ENDOSCOPY; WITH BIOPSY, SINGLE OR MULTIPLE;  Surgeon: Rona Ravens, MD;  [facility];  Service: Gastroenterology   ??? RADIATION Left    ??? REDUCTION MAMMAPLASTY         Social Hx:    Social History     Socioeconomic History   ??? Marital status: Divorced     Spouse name: None   ??? Number of children: None   ??? Years of education: None   ??? Highest education level: None   Tobacco Use   ??? Smoking status: Former Smoker     Packs/day: 2.00     Years: 48.00     Pack years: 96.00     Types: Cigarettes     Quit date: 11/06/2011     Years since quitting: 9.3   ??? Smokeless tobacco: Never Used   Vaping Use   ??? Vaping Use: Never used Substance and Sexual Activity   ??? Alcohol use: No   ??? Drug use: No     Social Determinants of Health     Financial Resource Strain: Medium Risk   ??? Difficulty of Paying Living Expenses: Somewhat hard   Food Insecurity: Food Insecurity Present   ??? Worried About Programme researcher, broadcasting/film/video in the Last Year: Sometimes true   ??? Ran Out of Food in the Last Year: Never true   Transportation Needs: No Transportation Needs   ??? Lack of Transportation (Medical): No   ??? Lack of Transportation (Non-Medical): No       Family Hx:    Family History   Problem Relation Age of Onset   ??? Cancer Brother         liver   ??? Cancer Maternal Aunt    ??? Cancer Maternal Uncle    ??? Diabetes Paternal Uncle    ??? Other Mother         MVA   ??? Other Father         MVA   ??? No Known Problems Sister    ??? No Known Problems Son    ??? Cancer Brother         Lung   ??? Other Sister         MVA   ??? Other Sister         MVA drunk driver   ??? No Known Problems Brother    ??? No Known Problems Daughter    ??? No Known Problems Maternal Grandmother    ??? No Known Problems Maternal Grandfather    ??? No Known Problems Paternal Grandmother    ??? No Known Problems Paternal Grandfather    ??? No Known Problems Other    ??? Heart failure Neg Hx    ??? BRCA 1/2 Neg Hx    ??? Breast cancer Neg Hx    ??? Colon cancer Neg Hx    ??? Endometrial cancer Neg Hx    ??? Ovarian cancer Neg Hx        ALLERGIES:  No Known Allergies    CURRENT MEDICATIONS:    Current Outpatient Medications   Medication Sig Dispense Refill   ???  aspirin-calcium carbonate 81 mg-300 mg calcium(777 mg) Tab Take 81 mg by mouth.     ??? blood sugar diagnostic (ONETOUCH ULTRA TEST) Strp Test blood sugars once daily or as directed 100 strip 3   ??? clonazePAM (KLONOPIN) 0.5 MG tablet TAKE ONE TABLET BY MOUTH TWICE DAILY AS NEEDED FOR ANXIETY 180 tablet 0   ??? docusate sodium (COLACE) 100 MG capsule Take 100 mg by mouth daily.     ??? empty container (SHARPS-A-GATOR DISPOSAL SYSTEM) Misc Use as directed for sharps disposal 1 each 2   ??? evolocumab (REPATHA SURECLICK) 140 mg/mL PnIj Inject the contents of 1 pen (140 mg) under the skin every fourteen (14) days. 2 mL 11   ??? ferrous gluconate 324 MG tablet Take 324 mg by mouth once.     ??? levothyroxine (SYNTHROID) 100 MCG tablet Take 100 mcg by mouth daily.      ??? lisinopriL (PRINIVIL,ZESTRIL) 20 MG tablet Take 1 tablet (20 mg total) by mouth daily. 90 tablet 3   ??? metFORMIN (GLUCOPHAGE) 500 MG tablet Take 2 tablets (1,000 mg total) by mouth 2 (two) times a day with meals. 180 tablet 0   ??? pravastatin (PRAVACHOL) 10 MG tablet Take 0.5 tablets (5 mg total) by mouth daily. 15 tablet 3   ??? SUMAtriptan (IMITREX) 100 MG tablet Take one tab at onset of headache, may repeat in 2 hours if headache persists of recurs 9 tablet 5   ??? levETIRAcetam (KEPPRA) 750 MG tablet Take 1 tablet (750 mg total) by mouth Two (2) times a day. 180 tablet 3     No current facility-administered medications for this visit.       Review of Systems:  A 10-systems review was performed and, unless otherwise noted, declared negative by patient.        Objective:   ??  Objective [] Expand by Default     Physical Exam:  Height 5'5'', weight 174 lbs.   ??  Sitting BP 114/59 HR 78  Standing: BP 117/85 HR 85  ??  Neurological Examination:     Motor: symmetric bulk throughout. No fasciculations.     ??  MOVEMENT DISORDERS EXAMINATION:   Facial expression was normal . Volume of speech was normal initially, later in the visit would become strained at times particularly at the end of a long sentence.  There was no evidence of tremor. Rapid alternating movements were RAMs: normal in speed,  RAMs: normal in amplitude on the right, and RAMs: normal in speed, RAMs: normal in amplitude on the left.  Foot tap was RAMs: normal on the right and RAMs: normal on the left. She could get up from a low lying chair without help of both hands.  Gait was steady with good heel to toe stride, stride length was normal. Speed was normal.   Arm swing was normal bilaterally.   ??  Her voice starts normal when speaking, but becomes strained and quiet after speaking for a long time. Initially she could speak normally for at least 5-10 seconds, but as the visit progressed, the strained sound would come faster after about 1-2 words. Voice quality improves after brief cessation of speech. While sitting and not speaking, there are no abnormal movements and she is breathing comfortably at rest.    Coordination: FtN without dysmetria or ataxia.     Gait: Normal gait as above      Diagnostic Studies and Review of Records:      Studies reviewed:  MRI C-spine:  multilevel DJD, most prominent at C4-5, no cord signal abnormality to my eye, and reviewed with neuroradiologist    Fluoroscopic chest evaluation with sniff test: no abnormal movement of diaphragm or other chest wall structures with rest, normal breathing, deep breathing, speech, or sniffing    Ehrlichia antibodies: 1:256 04/2017 and SAME on repeat 05/2017    ACTH stimulation test 07/2017: normal    Cortisol: normal    TSH: 0.50 (slightly low)

## 2021-03-04 NOTE — Unmapped (Signed)
Assessment:  70 y.o. female with a history of recurrent UTIs.    She does meet the criteria for recurrent UTI: >2 UTIs in 6 months or >3 UTIs in a year.  We discussed only treating her with antibiotics if she has a documented positive urine culture with UTI symptoms. It is not recommend to treat with antibiotics if she has a positive culture and no symptoms.    We discussed risk factors for UTI including kidney stones, other health issues like diabetes, low water intake, obstruction causing urinary retention from either an enlarged prostate, urethral stricture and/or neurogenic bladder.  Other causes include vesicoureteral reflux, postmenopausal female, and the female GU tract.  I also explained that increased risk for women after intercourse.      Her PVR is elevated today. She does have diabetes which increases her risk for UTIs but this appears well controlled based off her last hemoglobin A1c.  She is not on an estrogen receptor modulator which would increase her risk for UTIs.  She is postmenopausal and has significant vulvovaginal atrophy on exam.  She does not correlate infections with intercourse. She has chronic constipation.     Given the number of urinary tract infection she has had over the last year I would like to obtain a CT urogram and cystoscopy.  Vaginal swabs were also obtained today to rule out vaginal infections.    We also had a detailed discussion about prevention including hydration, D-mannose, constipation prevention, topical estrogen and suppressive antibiotics..  We discussed the risks and benefits of starting a prophylactic antibiotic including antibiotic resistance.  We also discussed the risk of benefits of starting and estrogen cream.  At one time it was thought that topical estrogens increase the risk of breast cancer although data has shown that there is no increased risk of breast cancer recurrence with the use of topical estrogen.  This is supported by the [employer] of obstetrics and gynecologist who recommended use of topical estrogen for breast cancer survivors.  I will discuss this further with her oncology nurse practitioner and follow-up with her after this discussion.     I would like to send her urine for culture today and treat pending the results.  If this is negative then we will start her on a suppressive antibiotic, most likely nitrofurantoin 100 mg daily.      Plan:  - CTU and cystoscopy, requested in the next month  - Vaginal swab  - UA/UCx today-start suppressive abx pending resutls  - Hydration, D-mannose of prevention  - Consult with oncology on topical estrogen  - Start MiraLax for constipation prevention  - Follow-up after CT and cystoscopy      Referring Provider:  Dr. Barbette Merino    PCP:   [name], MD     Reason for Visit:  Recurrent UTI    HPI:  Amber Duncan is a pleasant 70 y.o. year-old female with a history of pT1bN0 L breast IDC (ER/PR pos, HER2 neg) s/p partial mastectomy and EBRT, COPD, CAD, DM, HLD, HTN, and migraines who is being seen today in consultation at the request of Dr. Barbette Merino for recurrent UTI.    Today, she reports she has chronic UTIs. She has been on antibiotics every month for the past year. She had UTI symptoms that started last week and she did not see her PCP because she was coming her to be seen.  She has taken Azo every day to help with her symptoms.  When she has an UTI she has bladder pain that is worse at night and frequency. However she is only able to void a small amount when she goes.  Her symptoms mostly go away with antibiotics. She has bladder pain at night that is less severe on antibiotics.  In her 40's she had cystoscopy for microhematuria. This was normal.     She denies gross hematuria, kidney stones. She had a partial hysterectomy in her 30's. She has not had a recent pelvic exam.     She was diagnosed with breast cancer last year and is followed by [name], NP.    She reports that her diabetes is well controlled. Her last HgbA1c was 5.4 10/11/2020.    She has chronic constipation.  She reports that if she does not take Colace every 3 days she has a bowel movement once a week.  When she takes Colace every 3 days she has a daily bowel movement.    She drinks 36 ounces of water a day. She also drinks 2 cups of coffee in the morning. She occasionally drinks soda or tea. She denies alcohol use.     UTI History:  10/11/20: >100k E.coli tx with Macrobid BID x 10 days  11/08/20: >100k Klebsiella R Amp tx with Macrobid BID x 10 days  12/23/20: >100k Proteus R Nitrofurantoin, Tetracycline, I Cefazolin tx with Cipro 500mg  BID x 10 days  09/03/20: >100k Klebsiella R amp  06/18/20: >100k Enterobacter R Cefazolin Cefuroxime, Augmentin; I tetracycline  11/28/19: >100k Klebesilla R Amp, I Nitrofurantoin       Past medical history:  Past Medical History:   Diagnosis Date   ??? Breast cancer (CMS-HCC)    ??? Chronic UTI    ??? COPD (chronic obstructive pulmonary disease) (CMS-HCC)    ??? Coronary artery disease    ??? Diabetes (CMS-HCC)    ??? Disease of thyroid gland    ??? Fatigue    ??? Hepatitis    ??? Hepatitis    ??? Hyperlipidemia    ??? Hypertension    ??? Migraine    ??? Sleep apnea        Past surgical history:  Past Surgical History:   Procedure Laterality Date   ??? BACK SURGERY     ??? BREAST BIOPSY     ??? BREAST LUMPECTOMY Left 01/2020   ??? CATARACT EXTRACTION     ??? CHOLECYSTECTOMY     ??? CORONARY STENT PLACEMENT     ??? HYSTERECTOMY     ??? KNEE SURGERY     ??? PR COLONOSCOPY,ABLATE LESION N/A 01/21/2017    Procedure: COLONOSCOPY FLEX; W/ABLAT LES NOT AMENABLE-SNARE;  Surgeon: Luanne Bras, MD;  [facility]: [facility];  Service: Gastroenterology   ??? PR COLSC FLX W/RMVL OF TUMOR POLYP LESION SNARE TQ N/A 01/21/2017    Procedure: COLONOSCOPY FLEX; W/REMOV TUMOR/LES BY SNARE;  Surgeon: Luanne Bras, MD;  [facility];  Service: Gastroenterology   ??? PR MASTECTOMY, PARTIAL Left 01/19/2020    Procedure: MASTECTOMY, PARTIAL (EG, LUMPECTOMY, TYLECTOMY, QUADRANTECTOMY, SEGMENTECTOMY);  Surgeon: Talbert Cage, DO;  Location: MA[facility] OR [facility];  Service: Surgical Oncology Breast   ??? PR UPPER GI ENDOSCOPY,BIOPSY N/A 04/29/2018    Procedure: UGI ENDOSCOPY; WITH BIOPSY, SINGLE OR MULTIPLE;  Surgeon: Rona Ravens, MD;  [facility];  Service: Gastroenterology   ??? RADIATION Left    ??? REDUCTION MAMMAPLASTY         Social  history:  Social History     Social History Narrative   ??? Not on file       Family history:  family history includes Cancer in her brother, brother, maternal aunt, and maternal uncle; Diabetes in her paternal uncle; No Known Problems in her brother, daughter, maternal grandfather, maternal grandmother, paternal grandfather, paternal grandmother, sister, son, and another family member; Other in her father, mother, sister, and sister.    MEDICATIONS:   Current Outpatient Medications   Medication Sig Dispense Refill   ??? aspirin-calcium carbonate 81 mg-300 mg calcium(777 mg) Tab Take 81 mg by mouth.     ??? blood sugar diagnostic (ONETOUCH ULTRA TEST) Strp Test blood sugars once daily or as directed 100 strip 3   ??? clonazePAM (KLONOPIN) 0.5 MG tablet TAKE ONE TABLET BY MOUTH TWICE DAILY AS NEEDED FOR ANXIETY 180 tablet 0   ??? docusate sodium (COLACE) 100 MG capsule Take 100 mg by mouth daily.     ??? empty container (SHARPS-A-GATOR DISPOSAL SYSTEM) Misc Use as directed for sharps disposal 1 each 2   ??? evolocumab (REPATHA SURECLICK) 140 mg/mL PnIj Inject the contents of 1 pen (140 mg) under the skin every fourteen (14) days. 2 mL 11   ??? ferrous gluconate 324 MG tablet Take 324 mg by mouth once.     ??? levETIRAcetam (KEPPRA) 750 MG tablet Take 1 tablet (750 mg total) by mouth Two (2) times a day. 180 tablet 3   ??? levothyroxine (SYNTHROID) 100 MCG tablet Take 100 mcg by mouth daily.      ??? lisinopriL (PRINIVIL,ZESTRIL) 20 MG tablet Take 1 tablet (20 mg total) by mouth daily. 90 tablet 3 ??? metFORMIN (GLUCOPHAGE) 500 MG tablet Take 2 tablets (1,000 mg total) by mouth 2 (two) times a day with meals. 180 tablet 0   ??? pravastatin (PRAVACHOL) 10 MG tablet Take 0.5 tablets (5 mg total) by mouth daily. 15 tablet 3   ??? SUMAtriptan (IMITREX) 100 MG tablet Take one tab at onset of headache, may repeat in 2 hours if headache persists of recurs 9 tablet 5     No current facility-administered medications for this visit.       Allergies:  No Known Allergies    Review of Systems:  Positive for constipation, fatigue, leg swelling, indigestion, constipation, HA, numbness, urinary frequency, urinary urgency, incontinence, weak urinary stream, difficulty starting urination, nocturia and dysuria.  Otherwise, a 10 system ROS questionnaire completed by the patient and reviewed by me was normal.    Physical Exam  BMI: Body mass index is 28.54 kg/m??.  VITALS: BP 146/92  - Pulse 113  - Temp 36.3 ??C (97.3 ??F) (Temporal)  - Resp 18  - Wt 77.8 kg (171 lb 8 oz)  - BMI 28.54 kg/m??   GENERAL: The patient is a pleasant, female in no acute distress.   HEENT: Normocephalic and atraumatic. Extraocular movements are intact.   PULMONARY: Relaxed respiratory effort on room air.   CARDIOVASCULAR: No edema.   GENITOURINARY:  Normal urethra.  External genitalia with severe vulvovaginal atrophy.  Vaginal exam reveals atrophy and an absent cervix.  Vaginal swab obtained.  Grade 1 cystocele and rectocele.  SKIN: No signs of cyanosis or clubbing.   NEUROLOGICAL: Grossly intact.   PSYCH: Alert and oriented x 3.     Labs:  Results for orders placed or performed in visit on 01/13/21   CBC   Result Value Ref Range    WBC 4.9 3.6 - 11.2  10*9/L    RBC 3.65 (L) 3.95 - 5.13 10*12/L    HGB 12.0 11.3 - 14.9 g/dL    HCT 29.5 62.1 - 30.8 %    MCV 96.8 (H) 77.6 - 95.7 fL    MCH 32.9 (H) 25.9 - 32.4 pg    MCHC 34.0 32.0 - 36.0 g/dL    RDW 65.7 84.6 - 96.2 %    MPV 8.5 6.8 - 10.7 fL    Platelet 277 150 - 450 10*9/L   TSH   Result Value Ref Range    TSH 2.108 0.550 - 4.780 uIU/mL   Vitamin D 25 Hydroxy (25OH D2 + D3)   Result Value Ref Range    Vitamin D Total (25OH) 43.8 20.0 - 80.0 ng/mL   Iron   Result Value Ref Range    Iron 106 50 - 170 ug/dL   Ferritin   Result Value Ref Range    Ferritin 30.9 7.3 - 270.7 ng/mL   Vitamin B12   Result Value Ref Range    Vitamin B-12 643 211 - 911 pg/ml       PVR:  155cc

## 2021-03-05 ENCOUNTER — Ambulatory Visit: Admit: 2021-03-05 | Discharge: 2021-03-06 | Payer: PRIVATE HEALTH INSURANCE

## 2021-03-05 DIAGNOSIS — N390 Urinary tract infection, site not specified: Principal | ICD-10-CM

## 2021-03-05 DIAGNOSIS — R300 Dysuria: Principal | ICD-10-CM

## 2021-03-05 DIAGNOSIS — R3129 Other microscopic hematuria: Principal | ICD-10-CM

## 2021-03-05 DIAGNOSIS — J449 Chronic obstructive pulmonary disease, unspecified: Secondary | ICD-10-CM | POA: Diagnosis not present

## 2021-03-05 DIAGNOSIS — Z780 Asymptomatic menopausal state: Secondary | ICD-10-CM | POA: Diagnosis not present

## 2021-03-05 DIAGNOSIS — E079 Disorder of thyroid, unspecified: Secondary | ICD-10-CM | POA: Diagnosis not present

## 2021-03-05 DIAGNOSIS — I2510 Atherosclerotic heart disease of native coronary artery without angina pectoris: Secondary | ICD-10-CM | POA: Diagnosis not present

## 2021-03-05 DIAGNOSIS — Z7984 Long term (current) use of oral hypoglycemic drugs: Secondary | ICD-10-CM | POA: Diagnosis not present

## 2021-03-05 DIAGNOSIS — Z833 Family history of diabetes mellitus: Secondary | ICD-10-CM | POA: Diagnosis not present

## 2021-03-05 DIAGNOSIS — E785 Hyperlipidemia, unspecified: Secondary | ICD-10-CM | POA: Diagnosis not present

## 2021-03-05 DIAGNOSIS — E119 Type 2 diabetes mellitus without complications: Secondary | ICD-10-CM | POA: Diagnosis not present

## 2021-03-05 DIAGNOSIS — Z79899 Other long term (current) drug therapy: Secondary | ICD-10-CM | POA: Diagnosis not present

## 2021-03-05 DIAGNOSIS — Z853 Personal history of malignant neoplasm of breast: Secondary | ICD-10-CM | POA: Diagnosis not present

## 2021-03-05 DIAGNOSIS — Z7982 Long term (current) use of aspirin: Secondary | ICD-10-CM | POA: Diagnosis not present

## 2021-03-05 DIAGNOSIS — G4730 Sleep apnea, unspecified: Secondary | ICD-10-CM | POA: Diagnosis not present

## 2021-03-05 DIAGNOSIS — Z955 Presence of coronary angioplasty implant and graft: Secondary | ICD-10-CM | POA: Diagnosis not present

## 2021-03-05 DIAGNOSIS — Z923 Personal history of irradiation: Secondary | ICD-10-CM | POA: Diagnosis not present

## 2021-03-05 DIAGNOSIS — F419 Anxiety disorder, unspecified: Secondary | ICD-10-CM | POA: Diagnosis not present

## 2021-03-05 DIAGNOSIS — Z79890 Hormone replacement therapy (postmenopausal): Secondary | ICD-10-CM | POA: Diagnosis not present

## 2021-03-05 DIAGNOSIS — Z6828 Body mass index (BMI) 28.0-28.9, adult: Secondary | ICD-10-CM | POA: Diagnosis not present

## 2021-03-05 DIAGNOSIS — I10 Essential (primary) hypertension: Secondary | ICD-10-CM | POA: Diagnosis not present

## 2021-03-05 DIAGNOSIS — Z9049 Acquired absence of other specified parts of digestive tract: Secondary | ICD-10-CM | POA: Diagnosis not present

## 2021-03-05 DIAGNOSIS — Z809 Family history of malignant neoplasm, unspecified: Secondary | ICD-10-CM | POA: Diagnosis not present

## 2021-03-05 DIAGNOSIS — K5909 Other constipation: Secondary | ICD-10-CM | POA: Diagnosis not present

## 2021-03-05 DIAGNOSIS — G43909 Migraine, unspecified, not intractable, without status migrainosus: Secondary | ICD-10-CM | POA: Diagnosis not present

## 2021-03-05 LAB — URINALYSIS
PH UA: 5 (ref 5.0–9.0)
RBC UA: >100 /HPF — ABNORMAL HIGH (ref <4)
SPECIFIC GRAVITY UA: 1.015 (ref 1.005–1.040)
SQUAMOUS EPITHELIAL: 8 /HPF — ABNORMAL HIGH (ref 0–5)
WBC UA: >100 /HPF — ABNORMAL HIGH (ref 0–5)

## 2021-03-05 MED ORDER — METRONIDAZOLE 500 MG TABLET
ORAL_TABLET | Freq: Two times a day (BID) | ORAL | 0 refills | 7 days | Status: CP
Start: 2021-03-05 — End: 2021-03-12

## 2021-03-05 NOTE — Unmapped (Signed)
PVR =    Livia Snellen, RN

## 2021-03-05 NOTE — Unmapped (Addendum)
Schedule CT scan and cystoscopy   Check a urine and vaginal test  Drink 2.5-3L of water daily  Start D-mannose, follow instructions on the bottle. You can find this at the pharmacy or on line  Start taking 1 capful of MiraLax daily for constipation  I will contact [name], NP about a topical estrogen  Follow-up after your CT and cystoscopy

## 2021-03-06 NOTE — Unmapped (Signed)
Spoke to her she has the med

## 2021-03-07 DIAGNOSIS — N390 Urinary tract infection, site not specified: Principal | ICD-10-CM

## 2021-03-07 MED ORDER — CEPHALEXIN 500 MG TABLET
ORAL_TABLET | Freq: Four times a day (QID) | ORAL | 0 refills | 7 days | Status: CP
Start: 2021-03-07 — End: 2021-03-14

## 2021-03-07 MED ORDER — NITROFURANTOIN MONOHYDRATE/MACROCRYSTALS 100 MG CAPSULE
ORAL_CAPSULE | Freq: Every day | ORAL | 5 refills | 30 days | Status: CP
Start: 2021-03-07 — End: ?

## 2021-03-11 MED FILL — REPATHA SURECLICK 140 MG/ML SUBCUTANEOUS PEN INJECTOR: SUBCUTANEOUS | 28 days supply | Qty: 2 | Fill #7

## 2021-03-12 ENCOUNTER — Ambulatory Visit: Admit: 2021-03-12 | Discharge: 2021-03-13 | Payer: PRIVATE HEALTH INSURANCE

## 2021-03-12 DIAGNOSIS — N390 Urinary tract infection, site not specified: Secondary | ICD-10-CM | POA: Diagnosis not present

## 2021-03-12 DIAGNOSIS — R3129 Other microscopic hematuria: Secondary | ICD-10-CM | POA: Diagnosis not present

## 2021-03-12 MED ADMIN — iohexoL (OMNIPAQUE) 350 mg iodine/mL solution 100 mL: 100 mL | INTRAVENOUS | @ 13:00:00 | Stop: 2021-03-12

## 2021-03-17 MED ORDER — CLONAZEPAM 0.5 MG TABLET
ORAL_TABLET | 0 refills | 0 days | Status: CP
Start: 2021-03-17 — End: ?

## 2021-04-01 NOTE — Unmapped (Signed)
[facility] Refill Coordination Note    Specialty Medication(s) to be Shipped:   General Specialty: Repatha 140mg /ml Inj  Other medication(s) to be shipped: No additional medications requested for fill at this time     Amber Duncan, DOB: 12/28/1950  Phone: [phone] (home)     All above HIPAA information was verified with patient.     Was a Nurse, learning disability used for this call? No    Completed refill call assessment today to schedule patient's medication shipment from the [facility] [phone]).  All relevant notes have been reviewed.     Specialty medication(s) and dose(s) confirmed: Regimen is correct and unchanged.   Changes to medications: Rochel reports no changes at this time.  Changes to insurance: No  New side effects reported not previously addressed with a pharmacist or physician: None reported  Questions for the pharmacist: No    Confirmed patient received a Conservation officer, historic buildings and a Surveyor, mining with first shipment. The patient will receive a drug information handout for each medication shipped and additional FDA Medication Guides as required.       DISEASE/MEDICATION-SPECIFIC INFORMATION        For patients on injectable medications: Patient currently has 0 doses left.  Next injection is scheduled for 04/07/2021.    SPECIALTY MEDICATION ADHERENCE     Medication Adherence    Patient reported X missed doses in the last month: 0  Specialty Medication: Repatha 140mg /ml Inj  Patient is on additional specialty medications: No  Patient is on more than two specialty medications: No  Informant: patient  Reliability of informant: reliable  Reasons for non-adherence: no problems identified  Confirmed plan for next specialty medication refill: delivery by pharmacy  Refills needed for supportive medications: not needed        Were doses missed due to medication being on hold? No    Repatha 140mg /ml Inj: 0 days of medicine on hand     REFERRAL TO PHARMACIST     Referral to the pharmacist: Not needed    [facility]     Shipping address confirmed in Epic.     Delivery Scheduled: Yes, Expected medication delivery date: 04/07/2021.     Medication will be delivered via Same Day Courier to the prescription address in Epic WAM.    Akiera Allbaugh P Allena Katz   [facility] Specialty Technician

## 2021-04-04 MED ORDER — LEVOTHYROXINE 100 MCG TABLET
ORAL_TABLET | Freq: Every day | ORAL | 0 refills | 90 days | Status: CP
Start: 2021-04-04 — End: ?

## 2021-04-04 NOTE — Unmapped (Signed)
Patient is requesting the following refill  Requested Prescriptions     Pending Prescriptions Disp Refills   ??? levothyroxine (SYNTHROID) 100 MCG tablet 90 tablet 0     Sig: Take 1 tablet (100 mcg total) by mouth in the morning.       Recent Visits  Date Type Provider Dept   01/13/21 Office Visit [name], MD [facility]   10/11/20 Office Visit Keri Rosita Fire, FNP [facility]   Showing recent visits within past 365 days with a meds authorizing provider and meeting all other requirements  Future Appointments  Date Type Provider Dept   07/15/21 Appointment [name], MD [facility]   Showing future appointments within next 365 days with a meds authorizing provider and meeting all other requirements       Labs:   TSH:   TSH (uIU/mL)   Date Value   01/13/2021 2.108

## 2021-04-07 ENCOUNTER — Other Ambulatory Visit: Admit: 2021-04-07 | Discharge: 2021-04-08 | Payer: PRIVATE HEALTH INSURANCE

## 2021-04-07 DIAGNOSIS — R3915 Urgency of urination: Principal | ICD-10-CM

## 2021-04-07 DIAGNOSIS — I2510 Atherosclerotic heart disease of native coronary artery without angina pectoris: Principal | ICD-10-CM

## 2021-04-07 DIAGNOSIS — R300 Dysuria: Principal | ICD-10-CM

## 2021-04-07 LAB — URINALYSIS
BILIRUBIN UA: NEGATIVE
BLOOD UA: NEGATIVE
GLUCOSE UA: NEGATIVE
KETONES UA: NEGATIVE
NITRITE UA: POSITIVE — AB
PH UA: 5 (ref 5.0–9.0)
RBC UA: 3 /HPF (ref <=4)
SPECIFIC GRAVITY UA: 1.018 (ref 1.003–1.030)
SQUAMOUS EPITHELIAL: 1 /HPF (ref 0–5)
UROBILINOGEN UA: <2
WBC UA: >182 /HPF — ABNORMAL HIGH (ref 0–5)

## 2021-04-07 NOTE — Unmapped (Signed)
Amber Duncan 's Repatha shipment will be canceled  as a result of a different copay. Copay increased to $93.66. No additional financial assistance is available at this time.     I have reached out to the patient  at [phone] and communicated the delay. We will route a message to clinic staff to inform them of this situation We have canceled this work request.        [name], PharmD - Clinical Pharmacist - [facility]   [address] [place], [place] [address]   Phone: [phone] - Fax. [phone]

## 2021-04-07 NOTE — Unmapped (Signed)
Patient called with urinary symptoms of burning, urgency and frequency.  Lab order place for patient to come in give [place] a specimen.

## 2021-04-08 DIAGNOSIS — N390 Urinary tract infection, site not specified: Principal | ICD-10-CM

## 2021-04-08 MED ORDER — NITROFURANTOIN MONOHYDRATE/MACROCRYSTALS 100 MG CAPSULE
ORAL_CAPSULE | Freq: Two times a day (BID) | ORAL | 0 refills | 7.00000 days | Status: CP
Start: 2021-04-08 — End: 2021-04-15

## 2021-04-09 NOTE — Unmapped (Signed)
E. coli bacteria growth noted.  Patient started on Macrobid to take as directed.

## 2021-04-14 MED ORDER — CLONAZEPAM 0.5 MG TABLET
ORAL_TABLET | 0 refills | 0 days
Start: 2021-04-14 — End: ?

## 2021-04-21 MED ORDER — CLONAZEPAM 0.5 MG TABLET
ORAL_TABLET | Freq: Two times a day (BID) | ORAL | 3 refills | 30.00000 days | Status: CP | PRN
Start: 2021-04-21 — End: ?

## 2021-05-02 ENCOUNTER — Ambulatory Visit: Admit: 2021-05-02 | Discharge: 2021-05-03 | Payer: PRIVATE HEALTH INSURANCE

## 2021-05-02 DIAGNOSIS — R300 Dysuria: Principal | ICD-10-CM

## 2021-05-02 MED ADMIN — lidocaine 2% gel (XYLOCAINE) jelly urojet 20 mL: 20 mL | URETHRAL | @ 13:00:00 | Stop: 2021-05-02

## 2021-05-02 MED ADMIN — sodium chloride irrigation (NS) 0.9 % irrigation solution 1,000 mL: 1000 mL | @ 13:00:00 | Stop: 2021-05-02

## 2021-05-02 NOTE — Unmapped (Signed)
Cystoscopy Procedure Note  Amber Duncan presents today for cystoscopy.     Indications: Amber Duncan 70 y.o. year old with history of recurrent UTI followed by APP Duffy Rhody here for diagnostic cystoscopy.  Of note she also underwent CT urogram on 03/12/2021 that was unrevealing. Please see note by APP Stanley from 03/05/2021 for further details.  Urine culture from 04/07/2021 revealed E. coli for which she was prescribed nitrofurantoin.    Diagnosis: Recurrent UTIs    Time out was performed     PROCEDURE: The risks of flexible cystourethroscopy which include but are not limited to infection, bleeding, and pain were explained and written informed consent was obtained.      Patient was brought into the procedure room. Patient was then placed in the supine position, prepped with betadine and sterilely draped. A timeout was performed confirming patient name, MRN, and procedure. Local anesthetic was administered per urethra with 2% lidocaine gel.     The urethra was cannulated with the flexible cystoscope, the bladder was inspected in its entirety. No tumors, stones, diverticulum, or lesions were identified.  The bilateral ureteral orifices were normal.  Upon retroflexion of the cystoscope, the bladder neck was normal.  The urethra appeared normal, no lesions noted.      The patient tolerated the procedure and there were no complications.    The attending surgeon was present throughout the duration of the procedure and did perform all of the key aspects.    Findings:  Unremarkable cystoscopy without evidence of underlying malignancy, lesions, diverticulum, nor bladder calculi.    Assessment/Plan:  Amber Duncan 70 y.o. year old with history of recurrent UTIs followed by APP Duffy Rhody here for diagnostic cystoscopy that was unremarkable.  Of note she also underwent CT urogram that was unrevealing.  [ ]  Follow-up with APP Duffy Rhody is previously scheduled 05/06/2021    Candace Cruise. Dow Adolph, MD MPH  Assistant Professor  Department of Urology   [facility]  [id number]  [place], [place] [address]  p [phone] f [phone]

## 2021-05-06 ENCOUNTER — Ambulatory Visit: Admit: 2021-05-06 | Discharge: 2021-05-07 | Payer: PRIVATE HEALTH INSURANCE

## 2021-05-06 DIAGNOSIS — R3129 Other microscopic hematuria: Principal | ICD-10-CM

## 2021-05-06 DIAGNOSIS — N390 Urinary tract infection, site not specified: Principal | ICD-10-CM

## 2021-05-06 DIAGNOSIS — N94819 Vulvodynia, unspecified: Principal | ICD-10-CM

## 2021-05-06 DIAGNOSIS — Z6829 Body mass index (BMI) 29.0-29.9, adult: Secondary | ICD-10-CM | POA: Diagnosis not present

## 2021-05-06 MED ORDER — D-MANNOSE 500 MG CAPSULE
ORAL_CAPSULE | Freq: Every day | ORAL | 11 refills | 0 days | Status: CP
Start: 2021-05-06 — End: ?

## 2021-05-06 NOTE — Unmapped (Signed)
Continue Nitrofurantoin daily to prevent UTIs  Start D-mannose once a day  Start using vaginal estrogen cream every night for three weeks and then twice a week indefinitely  Increase your water intake to 6 bottles a day  Return in 3 months

## 2021-05-06 NOTE — Unmapped (Signed)
Assessment:  70 y.o. female with a history of recurrent UTIs and vulvodynia..    Recommend continued suppression with Nitrofurantoin. Discussed topical estrogen with [name], NP who agreed to start low dose topical estrogen. Discussed with Linzi who agrees. Advised increased hydration and starting D-mannose. Will follow up win 3 months.  She understands to message me if she had another UTI.    Plan:  - Continue 100mg  Nitrofurantoin daily  - Hydration, D-mannose of prevention  - Start Estrace daily x 3 weeks and then twice a week indefinitely  - RTC in 3 months      Referring Provider:  Dr. Vinson Moselle    PCP:   [name], MD     Reason for Visit:  Recurrent UTI    HPI:  Amber Duncan is a pleasant [age] y.o. year-old female with a history of pT1bN0 L breast IDC (ER/PR pos, HER2 neg) s/p partial mastectomy and EBRT, COPD, CAD, DM, HLD, HTN, and migraines who is being seen today in consultation at the request of Dr. Barbette Merino for recurrent UTI.    rUTI:  - 03/05/21: has been on antibiotics every month for the past year; sx: bladder pain that is worse at night and frequency   - h/o BCa   - HgbA1c was 5.4 10/11/2020.   - has chronic constipation tx with Colace   - fluids: 36oz water, 2 cups of coffee, occasional tea and soda  - started suppressive abx with Nitrofurantoin 100mg , D-mannose, topical estrogen, increased hydration  - 03/12/21: CTU: Normal  - 05/02/21: cystoscopy normal    Today she reports she is still having vulvar burning. She has not denies dysuria and hematuria. She is drinking 4 bottle of water a day. She still has 2 cups of coffee a day.     UTI History:  10/11/20: >100k E.coli tx with Macrobid BID x 10 days  11/08/20: >100k Klebsiella R Amp tx with Macrobid BID x 10 days  12/23/20: >100k Proteus R Nitrofurantoin, Tetracycline, I Cefazolin tx with Cipro 500mg  BID x 10 days  09/03/20: >100k Klebsiella R amp  06/18/20: >100k Enterobacter R Cefazolin Cefuroxime, Augmentin; I tetracycline  11/28/19: >100k Klebesilla R Amp, I Nitrofurantoin       PROBLEM LIST:  Patient Active Problem List   Diagnosis   ??? Allergic rhinitis   ??? Anemia   ??? Coronary artery disease involving native coronary artery of native heart without angina pectoris   ??? Bilateral occipital neuralgia   ??? Cephalalgia   ??? Pulmonary emphysema (CMS-HCC)   ??? Diabetes mellitus without complication (CMS-HCC)   ??? Dyspnea   ??? Dysphonia spastica   ??? Essential hypertension   ??? Hoarseness or changing voice   ??? Mixed hyperlipidemia   ??? Iron deficiency anemia   ??? Multiple pulmonary nodules   ??? Obesity   ??? Hypothyroidism   ??? OSA on CPAP   ??? Voice resonance disorder   ??? Spasm of diaphragm   ??? Propriospinal myoclonus   ??? Angiodysplasia of intestine   ??? Weight loss   ??? Chronic fatigue   ??? Irritable bowel syndrome with diarrhea   ??? Malignant neoplasm of lower-inner quadrant of left breast in female, estrogen receptor positive (CMS-HCC)   ??? Chronic UTI   ??? Closed fracture of fourth metatarsal bone of left foot     MEDICATIONS:   Current Outpatient Medications   Medication Sig Dispense Refill   ??? aspirin-calcium carbonate 81 mg-300 mg calcium(777 mg) Tab Take 81 mg by  mouth.     ??? blood sugar diagnostic (ONETOUCH ULTRA TEST) Strp Test blood sugars once daily or as directed 100 strip 3   ??? clonazePAM (KLONOPIN) 0.5 MG tablet Take 1 tablet (0.5 mg total) by mouth two (2) times a day as needed for anxiety. 60 tablet 3   ??? docusate sodium (COLACE) 100 MG capsule Take 100 mg by mouth daily.     ??? empty container (SHARPS-A-GATOR DISPOSAL SYSTEM) Misc Use as directed for sharps disposal 1 each 2   ??? evolocumab (REPATHA SURECLICK) 140 mg/mL PnIj Inject the contents of 1 pen (140 mg) under the skin every fourteen (14) days. 2 mL 11   ??? ferrous gluconate 324 MG tablet Take 324 mg by mouth once.     ??? levETIRAcetam (KEPPRA) 750 MG tablet Take 1 tablet (750 mg total) by mouth Two (2) times a day. 180 tablet 3   ??? levothyroxine (SYNTHROID) 100 MCG tablet Take 1 tablet (100 mcg total) by mouth in the morning. 90 tablet 0   ??? lisinopriL (PRINIVIL,ZESTRIL) 20 MG tablet Take 1 tablet (20 mg total) by mouth daily. 90 tablet 3   ??? metFORMIN (GLUCOPHAGE) 500 MG tablet Take 2 tablets (1,000 mg total) by mouth 2 (two) times a day with meals. 180 tablet 0   ??? nitrofurantoin, macrocrystal-monohydrate, (MACROBID) 100 MG capsule Take 100 mg by mouth in the morning.     ??? pravastatin (PRAVACHOL) 10 MG tablet Take 0.5 tablets (5 mg total) by mouth daily. 15 tablet 3   ??? SUMAtriptan (IMITREX) 100 MG tablet Take one tab at onset of headache, may repeat in 2 hours if headache persists of recurs 9 tablet 5   ??? d-mannose 500 mg cap Take 1,500 mg by mouth daily. 90 capsule 11   ??? [START ON 05/08/2021] estradioL (ESTRACE) 0.01 % (0.1 mg/gram) vaginal cream Insert 2 g into the vagina Two (2) times a week. 16 g 11     No current facility-administered medications for this visit.       Allergies:  No Known Allergies    Review of Systems:  As per HPI.     Physical Exam  BMI: Body mass index is 29.82 kg/m??.  VITALS: BP 134/63  - Temp 36 ??C (96.8 ??F)  - Wt 81.3 kg (179 lb 3.2 oz)  - SpO2 99%  - BMI 29.82 kg/m??   GENERAL: The patient is a pleasant, female in no acute distress.   HEENT: Normocephalic and atraumatic. Extraocular movements are intact.   PULMONARY: Relaxed respiratory effort on room air.   CARDIOVASCULAR: No edema.   GENITOURINARY:  Deferred.  SKIN: No signs of cyanosis or clubbing.   NEUROLOGICAL: Grossly intact.   PSYCH: Alert and oriented x 3.     Labs:  Results for orders placed or performed in visit on 05/02/21   POCT Urinalysis Dipstick   Result Value Ref Range    Spec Gravity/POC 1.015 1.003 - 1.030    PH/POC 5.5 5.0 - 9.0    Leuk Esterase/POC Negative Negative    Nitrite/POC Negative Negative    Protein/POC Negative Negative    UA Glucose/POC Negative Negative    Ketones, POC Negative Negative    Bilirubin/POC Negative Negative    Blood/POC Trace-intact (A) Negative    Urobilinogen/POC 0.2 0.2 - 1.0 mg/dL PVR:  161WR

## 2021-05-07 NOTE — Unmapped (Signed)
Specialty Medication(s): Repatha    Amber Duncan has been dis-enrolled from the [facility] specialty pharmacy services due to patient declined medication due to increased cost. No financial assistance available at this time.    Additional information provided to the patient: n/a    [name]  [facility] Specialty Pharmacist

## 2021-05-08 MED ORDER — ESTRADIOL 0.01% (0.1 MG/GRAM) VAGINAL CREAM
VAGINAL | 11 refills | 28 days | Status: CP
Start: 2021-05-08 — End: 2022-05-08

## 2021-05-15 ENCOUNTER — Ambulatory Visit: Admit: 2021-05-15 | Discharge: 2021-05-16 | Payer: PRIVATE HEALTH INSURANCE

## 2021-05-15 DIAGNOSIS — E782 Mixed hyperlipidemia: Principal | ICD-10-CM

## 2021-05-15 DIAGNOSIS — D500 Iron deficiency anemia secondary to blood loss (chronic): Principal | ICD-10-CM

## 2021-05-15 DIAGNOSIS — Z170 Estrogen receptor positive status [ER+]: Principal | ICD-10-CM

## 2021-05-15 DIAGNOSIS — I10 Essential (primary) hypertension: Principal | ICD-10-CM

## 2021-05-15 DIAGNOSIS — M79602 Pain in left arm: Principal | ICD-10-CM

## 2021-05-15 DIAGNOSIS — N390 Urinary tract infection, site not specified: Principal | ICD-10-CM

## 2021-05-15 DIAGNOSIS — C50312 Malignant neoplasm of lower-inner quadrant of left female breast: Principal | ICD-10-CM

## 2021-05-15 DIAGNOSIS — E119 Type 2 diabetes mellitus without complications: Principal | ICD-10-CM

## 2021-05-15 DIAGNOSIS — R5382 Chronic fatigue, unspecified: Principal | ICD-10-CM

## 2021-05-15 DIAGNOSIS — Z862 Personal history of diseases of the blood and blood-forming organs and certain disorders involving the immune mechanism: Principal | ICD-10-CM

## 2021-05-15 DIAGNOSIS — E039 Hypothyroidism, unspecified: Principal | ICD-10-CM

## 2021-05-15 DIAGNOSIS — J383 Other diseases of vocal cords: Principal | ICD-10-CM

## 2021-05-15 DIAGNOSIS — I2510 Atherosclerotic heart disease of native coronary artery without angina pectoris: Principal | ICD-10-CM

## 2021-05-15 DIAGNOSIS — Z23 Encounter for immunization: Secondary | ICD-10-CM | POA: Diagnosis not present

## 2021-05-15 MED ORDER — METFORMIN 500 MG TABLET
ORAL_TABLET | Freq: Two times a day (BID) | ORAL | 1 refills | 90.00000 days | Status: CP
Start: 2021-05-15 — End: ?

## 2021-05-16 MED ORDER — LEVETIRACETAM 1,000 MG TABLET
ORAL_TABLET | Freq: Two times a day (BID) | ORAL | 3 refills | 90 days | Status: CP
Start: 2021-05-16 — End: 2022-05-16

## 2021-05-19 MED ORDER — LISINOPRIL 20 MG TABLET
ORAL_TABLET | 3 refills | 0 days
Start: 2021-05-19 — End: ?

## 2021-05-22 DIAGNOSIS — E782 Mixed hyperlipidemia: Principal | ICD-10-CM

## 2021-05-22 DIAGNOSIS — I2510 Atherosclerotic heart disease of native coronary artery without angina pectoris: Principal | ICD-10-CM

## 2021-05-22 MED ORDER — PRAVASTATIN 10 MG TABLET
ORAL_TABLET | Freq: Every day | ORAL | 3 refills | 90 days | Status: CP
Start: 2021-05-22 — End: ?

## 2021-05-22 MED ORDER — LISINOPRIL 20 MG TABLET
ORAL_TABLET | Freq: Every day | ORAL | 3 refills | 90 days | Status: CP
Start: 2021-05-22 — End: ?

## 2021-05-23 DIAGNOSIS — I2510 Atherosclerotic heart disease of native coronary artery without angina pectoris: Principal | ICD-10-CM

## 2021-05-26 MED ORDER — LISINOPRIL 20 MG TABLET
ORAL_TABLET | 3 refills | 0.00000 days
Start: 2021-05-26 — End: ?

## 2021-06-02 ENCOUNTER — Ambulatory Visit: Admit: 2021-06-02 | Discharge: 2021-06-03 | Payer: PRIVATE HEALTH INSURANCE | Attending: Neurology | Primary: Neurology

## 2021-06-02 DIAGNOSIS — G253 Myoclonus: Principal | ICD-10-CM

## 2021-06-02 DIAGNOSIS — R066 Hiccough: Principal | ICD-10-CM

## 2021-06-02 DIAGNOSIS — Z6830 Body mass index (BMI) 30.0-30.9, adult: Secondary | ICD-10-CM | POA: Diagnosis not present

## 2021-06-02 MED ORDER — SUMATRIPTAN 100 MG TABLET
ORAL_TABLET | Freq: Once | ORAL | 3 refills | 0.00000 days | Status: CP | PRN
Start: 2021-06-02 — End: 2022-06-02

## 2021-06-02 MED ORDER — CLONAZEPAM 0.5 MG TABLET
ORAL_TABLET | 3 refills | 0 days | Status: CP
Start: 2021-06-02 — End: ?

## 2021-06-09 ENCOUNTER — Ambulatory Visit
Admit: 2021-06-09 | Discharge: 2021-06-10 | Payer: PRIVATE HEALTH INSURANCE | Attending: Nurse Practitioner | Primary: Nurse Practitioner

## 2021-06-09 DIAGNOSIS — R5382 Chronic fatigue, unspecified: Principal | ICD-10-CM

## 2021-06-09 DIAGNOSIS — D500 Iron deficiency anemia secondary to blood loss (chronic): Principal | ICD-10-CM

## 2021-06-09 DIAGNOSIS — Z6830 Body mass index (BMI) 30.0-30.9, adult: Secondary | ICD-10-CM | POA: Diagnosis not present

## 2021-06-17 ENCOUNTER — Ambulatory Visit: Admit: 2021-06-17 | Discharge: 2021-06-18 | Payer: PRIVATE HEALTH INSURANCE | Attending: Family | Primary: Family

## 2021-06-17 DIAGNOSIS — R300 Dysuria: Principal | ICD-10-CM

## 2021-06-17 DIAGNOSIS — R350 Frequency of micturition: Principal | ICD-10-CM

## 2021-06-17 DIAGNOSIS — N3001 Acute cystitis with hematuria: Principal | ICD-10-CM

## 2021-06-17 DIAGNOSIS — Z6829 Body mass index (BMI) 29.0-29.9, adult: Secondary | ICD-10-CM | POA: Diagnosis not present

## 2021-06-17 MED ORDER — CEPHALEXIN 500 MG CAPSULE
ORAL_CAPSULE | Freq: Two times a day (BID) | ORAL | 0 refills | 7 days | Status: CP
Start: 2021-06-17 — End: 2021-06-24

## 2021-07-02 ENCOUNTER — Ambulatory Visit: Admit: 2021-07-02 | Discharge: 2021-07-03 | Payer: PRIVATE HEALTH INSURANCE

## 2021-07-02 DIAGNOSIS — D500 Iron deficiency anemia secondary to blood loss (chronic): Secondary | ICD-10-CM | POA: Diagnosis not present

## 2021-07-08 NOTE — Unmapped (Signed)
Assessment/Plan:    Amber Duncan was seen today for follow-up.    Diagnoses and all orders for this visit:    Urinary frequency    Chronic fatigue  -     Ambulatory referral to Gastroenterology; Future    Hx of iron deficiency anemia  -     Ambulatory referral to Gastroenterology; Future    Hypothyroidism, unspecified type  -     SYNTHROID 100 mcg tablet; Take 1 tablet (100 mcg total) by mouth daily.    Essential hypertension    Coronary artery disease involving native coronary artery of native heart without angina pectoris    Dysphonia spastica    Chronic UTI    Mixed hyperlipidemia  -     Lipid Panel    Malignant neoplasm of lower-inner quadrant of left breast in female, estrogen receptor positive (CMS-HCC)    Iron deficiency anemia due to chronic blood loss    Depression, unspecified depression type  -     FLUoxetine (PROZAC) 10 MG tablet; Take 1 tablet (10 mg total) by mouth daily.        -Diabetes mellitus without complication  CMP done in April/2022 revealed normal serum electrolytes, creatinine 1.0, glucose of 151 and normal LFTs.  HGB A1c 5.3 in 05/2021.  DM has been well controlled. Continue metformin 1000 mg BID and diabetic diet.    -CAD/dyslipidemia: she is followed by [facility] cardiology and last seen in 12/2020 and the following is noted from that encounter:   Assessment & Plan:  1. Atherosclerosis of native coronary artery of native heart without angina pectoris  She has undergone 2 PCI's to her RCA, most recently in the setting of stable angina in November 2020. At present she is asymptomatic. She completed 6 months of DAPT, adequate for zotarolimus-eluting stents such as hers, particularly given that she has symptomatic anemia due to GI bleed. She developed myalgias to multiple statins in the past, and became markedly hyperglycemic on rosuvastatin. She started Repatha in 2021, and denies any issues administering the injection - recent LDL 34 (08/05/20), down from 184 (04/09/20). Recently began taking 5 mg pravastatin (half of 10mg  tab), and denies any muscle pain.  --continue ASA 81 mg daily indefinitely  --continue Repatha and low dose pravastatin   2. Essential hypertension  Longstanding with adequate control on current regimen.  --continue lisinopril 20 mg daily  ??She had to stop Repatha since her scholarship money ran out but this was recently renewed and she started back on it this past week. .  She continues on low dose Pravastatin  and daily low dose enteric coated aspirin.  She will need a higher intensity statin if she can tolerate it.  The pt is scheduled for cardiology appt in 09/2021.  Her LFT's were normal in 12/2020 and her last LDL cholesterol for 07/2021 was 48.  She is due for fasting lipid panel today.       -Breast cancer history/left side: infiltrating ductal carcinoma, estrogen receptor positive, progesterone receptor positive and HER2 negative s/p lumpectomy in 01/2020 and follow up radiation Rx completed in 02/2020.   She is scheduled for annual breast oncology visit with diagnostic mammogram scheduled for this month. Oral medication options have been deferred as per oncology. She has an oncology visit scheduled for next week.   Her last diagnostic imaging was done in 01/2021.     -Dysuria with history of chronic UTIs and microscopic hematuria  UA (+) nitrates, blood, protein, leukocytes and visit in February/2022.Marland KitchenShe was  treated with   nitrofurantoin, macrocrystal-monohydrate, (MACROBID) 100 MG capsule; Take 1 capsule (100 mg total) by mouth Two (2) times a day. She was referred to [facility] urology and she was seen in  04/2021. The following is noted from that encounter:  Assessment:  69 y.o. female with a history of recurrent UTIs and vulvodynia..  Recommend continued suppression with Nitrofurantoin. Discussed topical estrogen with Amber Sou, NP who agreed to start low dose topical estrogen. Discussed with Amber Duncan who agrees. Advised increased hydration and starting D-mannose. Will follow up win 3 months.  She understands to message me if she had another UTI.  Plan:  - Continue 100mg  Nitrofurantoin daily  - Hydration, D-mannose of prevention  - Start Estrace daily x 3 weeks and then twice a week indefinitely  - RTC in 3 months   She has a urology follow up visit in 07/2021  She states the macrobid resulted in diarrhea and GI upset and she stopped this medication.   The pt was seen in 06/2021 with UTI. Urine culture then revealed E coli growth. She was treated with Keflex Ab course as directed.   She has an upcoming urology appt scheduled.   Consider starting low dose daily Keflex for UTI prevention instead.     -History of anemia - chronic iron def anemia  The pt is followed by [facility] hematology and last visit was in 06/2021. Encounter details are as follows:   Assessment/Recommendations:  Amber Duncan is a 70 y.o. Caucasian female with a history of iron deficiency anemia, dysphonia spastica, DMT2 c/b gastroparesis, GERD, COPD, fatigue, hypothyroidism, HTN, CAD, chronic UTI, hyperlipidemia, and L breast cancer who we are seeing in consultation at the request of Amber Duncan for further evaluation of anemia.  1. Iron deficiency without anemia:  Available labs date back to 2013. First noted to be anemic 2015; she has  received IV iron at [facility] since 2017. Most recent labs from 06/09/21 show Hgb 11.8, MCV 98.5, Ferritin 19.2. Most likely cause of iron deficiency is occult blood loss from AVMs and possibly GERD. Last IV iron was ~ 1 year ago at [facility]; will trial additional IV iron at this time to see if this helps with symptoms. Also recommend she f/u with [place] to re-establish care, should anemia worsen in the future and she need repeat EGD/colo for further evaluation or tx of bleeding AVMs. See detailed HPI below for further information. Based on labs from 10/3, iron deficit is ~1000 mg.  Discussed with the patient my recommendation for IV iron replacement with Ferrlecit.  Reviewed risks and benefits of IV iron including the risk of anaphylaxis. Process for administration was reviewed including premedications and monitoring. All questions were answered and the patient was in agreement with this plan.  Plan:  - Labs collected today: CBC, Retic, Iron Studies: Iron, Iron Sat, Transferrin, TIBC, Ferritin and Sed Rate, CRP  - Plan for IV iron with Ferrlecit - 1000 mg to be given over 4 infusions.   - Continue OTC PO iron supplements as tolerated. Take 1 tab every other day, with Vitamin C. Do not take with dairy products. Recent research suggests that taking 1 tab PO iron every other day with Vitamin C is just as effective as taking iron daily or BID, with fewer GI side effects.  - Return in 4 months for repeat labs and assessment.  - Placed referral to [facility] [place] to re-establish care - pt requested Dr. Camila Li.  Schedule Ferrlecit  at [facility]   ??CBC done in 06/2021 revealed H/H of 11/35 with MCV of 98. Iron level was normal and ferritin level was low normal.    She takes iron supplement daily.  She has had 2 iron infusions since hematology visit last month and she is scheduled for 2 more infusions in the near future.  Patient had colonoscopy done in 2018 which revealed diverticulosis and 4 colon polyps.  She was advised to repeat colonoscopy in 3 years time.  She had GI bleed in 10/2019 and EGD and colonoscopy were done and pt reports no bleeding source was identified..  Bleeding was attributed to blood thinner which  pt was taking at the time and it was discontinued.  She is on low dose daily enteric aspirin instead as per cardiology recommendation.   She has chronic fatigue and reports history of iron def anemia for many years of ? etiology.   She has an upcoming hematology scheduled follow up visit in 10/2021 and cardiology recommended further GI evaluation. She needs a new GI referral.        -Hypothyroidism: pt continues on levothyroxine supplementation as directed. Her TSH was normal in 01/2021.      - left upper extremity pain  Given patient's history of left breast cancer she was referred for screening ultrasound which revealed no discrete soft tissue mass or fluid collection identified in the areas of clinical concern.  She is referred for screening left upper extremity xray and [place] orthopaedic referral.  Xray was negative for suspicious lesions and she reports never receiving a call about this appt.  She continues to have ongoing upper left arm lateral pain. Rule out deltoid tendonitis since she is tender over deltoid muscle insertion.  A new orthopaedic referral is submitted on last visit and she never received a call and she states she is feeling better now.      Dyslipidemia and hypertension:  Patient remains compliant with her current medication regimen.  She has been counseled on maintaining low-salt and low-fat dietary intake and exercise routine. She is statin sensitive and currently on repatha and pravastatin as directed by her cardiologist.  Serum creatinine level was 1.0 with normal LFTs in April/2022. As noted above, she had to stop Repatha and she will need to discuss alterative Rx with her cardiologist.  Lipid panel done in November/2021 revealed HDL 44 and LDL of 34.  She is due for lipid panel today.     Dysphonia spastica:  She is followed by [facility] neurology and last seen in 05/2021 and the following is noted from that encounter:   Assessment  Ms. Suares is a 70 y.o. female who  has a past medical history of Breast cancer (CMS-HCC), Chronic UTI, COPD (chronic obstructive pulmonary disease) (CMS-HCC), Coronary artery disease, Diabetes (CMS-HCC), Disease of thyroid gland, Fatigue, Hepatitis, Hepatitis, Hyperlipidemia, Hypertension, Migraine, and Sleep apnea. 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 Benefited from increased Keppra dose but now worse again and has some minor side effects of keppra. Will increase Clonazapem instead, we may need to try Baclofen instead if this does not improve.  Headache: Describes occipital neuralgia, is not interested in lidocaine injection. Sumatriptan was helpful in the past, I will prescribe a limited supply again today  ??Plan  Patient Instructions   1. I will prescribe Sumatriptan 100 mg as needed for pain  2. Please increase Clonazepam to 1.5 tabs twice daily. Please stay at this dose for at least one week. If this is still not helping enough, please increase to 2 tabs twice daily.  3. No change to the Keppra dose at this time, please continue 1,000 mg twice daily  4. Follow up at your scheduled appointment in January   5.     Pt states , her symptoms wax and wane but she continues on higher dose BZ as directed by neurology.    - Prior history of depression, Rx with Prozac: Her PHQ 9 score today is 4 but she always feels tired due to her medical conditions.  I recommend she start Prozac 10 mg daily.       I personally spent 40 minutes, face to face, and non face to face, in the care of this patient, which includes pre , intra and post visit time on date of service.  All documented time is specific to EM and dose not include any procedures performed today.        Return in about 6 months (around 01/12/2022) for Recheck and appt with Lauren for AWV in the interim.    Subjective:     HPI  The pt is seen today for 6 month follow up visit for the following HPI:    -Diabetes mellitus without complication  CMP done in April/2022 revealed normal serum electrolytes, creatinine 1.0, glucose of 151 and normal LFTs.  HGB A1c 5.3 in 05/2021.  DM has been well controlled. Continue metformin 1000 mg BID and diabetic diet.    -CAD/dyslipidemia: she is followed by [facility] cardiology and last seen in 12/2020 and the following is noted from that encounter:   Assessment & Plan:  1. Atherosclerosis of native coronary artery of native heart without angina pectoris  She has undergone 2 PCI's to her RCA, most recently in the setting of stable angina in November 2020. At present she is asymptomatic. She completed 6 months of DAPT, adequate for zotarolimus-eluting stents such as hers, particularly given that she has symptomatic anemia due to GI bleed. She developed myalgias to multiple statins in the past, and became markedly hyperglycemic on rosuvastatin. She started Repatha in 2021, and denies any issues administering the injection - recent LDL 34 (08/05/20), down from 184 (04/09/20). Recently began taking 5 mg pravastatin (half of 10mg  tab), and denies any muscle pain.  --continue ASA 81 mg daily indefinitely  --continue Repatha and low dose pravastatin   2. Essential hypertension  Longstanding with adequate control on current regimen.  --continue lisinopril 20 mg daily  ??She had to stop Repatha since her scholarship money ran out but this was recently renewed and she started back on it this past week. .  She continues on low dose Pravastatin  and daily low dose enteric coated aspirin.  She will need a higher intensity statin if she can tolerate it.  The pt is scheduled for cardiology appt in 09/2021.  Her LFT's were normal in 12/2020 and her last LDL cholesterol for 07/2021 was 48.  She is due for fasting lipid panel today.       -Breast cancer history/left side: infiltrating ductal carcinoma, estrogen receptor positive, progesterone receptor positive and HER2 negative s/p lumpectomy in 01/2020 and follow up radiation Rx completed in 02/2020.   She is scheduled for annual breast oncology visit with diagnostic mammogram scheduled for this month. Oral medication options have been deferred as per oncology. She has an oncology visit  scheduled for next week.   Her last diagnostic imaging was done in 01/2021.     -Dysuria with history of chronic UTIs and microscopic hematuria  UA (+) nitrates, blood, protein, leukocytes and visit in February/2022.Marland KitchenShe was treated with   nitrofurantoin, macrocrystal-monohydrate, (MACROBID) 100 MG capsule; Take 1 capsule (100 mg total) by mouth Two (2) times a day. She was referred to [facility] urology and she was seen in  04/2021. The following is noted from that encounter:  Assessment:  70 y.o. female with a history of recurrent UTIs and vulvodynia..  Recommend continued suppression with Nitrofurantoin. Discussed topical estrogen with Amber Sou, NP who agreed to start low dose topical estrogen. Discussed with [place] who agrees. Advised increased hydration and starting D-mannose. Will follow up win 3 months.  She understands to message me if she had another UTI.  Plan:  - Continue 100mg  Nitrofurantoin daily  - Hydration, D-mannose of prevention  - Start Estrace daily x 3 weeks and then twice a week indefinitely  - RTC in 3 months   She has a urology follow up visit in 07/2021  She states the macrobid resulted in diarrhea and GI upset and she stopped this medication.   The pt was seen in 06/2021 with UTI. Urine culture then revealed E coli growth. She was treated with Keflex Ab course as directed.   She has an upcoming urology appt scheduled.   Consider starting low dose daily Keflex for UTI prevention instead.     -History of anemia - chronic iron def anemia  The pt is followed by [facility] hematology and last visit was in 06/2021. Encounter details are as follows:   Assessment/Recommendations:  Amber Duncan is a 70 y.o. Caucasian female with a history of iron deficiency anemia, dysphonia spastica, DMT2 c/b gastroparesis, GERD, COPD, fatigue, hypothyroidism, HTN, CAD, chronic UTI, hyperlipidemia, and L breast cancer who we are seeing in consultation at the request of Amber Duncan for further evaluation of anemia.  1. Iron deficiency without anemia:  Available labs date back to 2013. First noted to be anemic 2015; she has  received IV iron at [facility] since 2017. Most recent labs from 06/09/21 show Hgb 11.8, MCV 98.5, Ferritin 19.2. Most likely cause of iron deficiency is occult blood loss from AVMs and possibly GERD. Last IV iron was ~ 1 year ago at [facility]; will trial additional IV iron at this time to see if this helps with symptoms. Also recommend she f/u with [place] [facility] to re-establish care, should anemia worsen in the future and she need repeat EGD/colo for further evaluation or tx of bleeding AVMs. See detailed HPI below for further information. Based on labs from 10/3, iron deficit is ~1000 mg.  Discussed with the patient my recommendation for IV iron replacement with Ferrlecit.  Reviewed risks and benefits of IV iron including the risk of anaphylaxis. Process for administration was reviewed including premedications and monitoring. All questions were answered and the patient was in agreement with this plan.  Plan:  - Labs collected today: CBC, Retic, Iron Studies: Iron, Iron Sat, Transferrin, TIBC, Ferritin and Sed Rate, CRP  - Plan for IV iron with Ferrlecit - 1000 mg to be given over 4 infusions.   - Continue OTC PO iron supplements as tolerated. Take 1 tab every other day, with Vitamin C. Do not take with dairy products. Recent research suggests that taking 1 tab PO iron every other day with Vitamin C is just as effective as taking  iron daily or BID, with fewer GI side effects.  - Return in 4 months for repeat labs and assessment.  - Placed referral to [facility] [place] to re-establish care - pt requested Dr. Camila Li.  Schedule Ferrlecit at [facility]   ??CBC done in 06/2021 revealed H/H of 11/35 with MCV of 98. Iron level was normal and ferritin level was low normal.    She takes iron supplement daily.  She has had 2 iron infusions since hematology visit last month and she is scheduled for 2 more infusions in the near future.  Patient had colonoscopy done in 2018 which revealed diverticulosis and 4 colon polyps.  She was advised to repeat colonoscopy in 3 years time.  She had GI bleed in 10/2019 and EGD and colonoscopy were done and pt reports no bleeding source was identified..  Bleeding was attributed to blood thinner which  pt was taking at the time and it was discontinued.  She is on low dose daily enteric aspirin instead as per cardiology recommendation.   She has chronic fatigue and reports history of iron def anemia for many years of ? etiology.   She has an upcoming hematology scheduled follow up visit in 10/2021 and cardiology recommended further GI evaluation. She needs a new GI referral.        -Hypothyroidism: pt continues on levothyroxine supplementation as directed. Her TSH was normal in 01/2021.      - left upper extremity pain  Given patient's history of left breast cancer she was referred for screening ultrasound which revealed no discrete soft tissue mass or fluid collection identified in the areas of clinical concern.  She is referred for screening left upper extremity xray and [place] orthopaedic referral.  Xray was negative for suspicious lesions and she reports never receiving a call about this appt.  She continues to have ongoing upper left arm lateral pain. Rule out deltoid tendonitis since she is tender over deltoid muscle insertion.  A new orthopaedic referral is submitted on last visit and she never received a call and she states she is feeling better now.      Dyslipidemia and hypertension:  Patient remains compliant with her current medication regimen.  She has been counseled on maintaining low-salt and low-fat dietary intake and exercise routine. She is statin sensitive and currently on repatha and pravastatin as directed by her cardiologist.  Serum creatinine level was 1.0 with normal LFTs in April/2022. As noted above, she had to stop Repatha and she will need to discuss alterative Rx with her cardiologist.  Lipid panel done in November/2021 revealed HDL 44 and LDL of 34.  She is due for lipid panel today.     Dysphonia spastica:  She is followed by [facility] neurology and last seen in 05/2021 and the following is noted from that encounter:   Assessment  Ms. Jou is a 70 y.o. female who  has a past medical history of Breast cancer (CMS-HCC), Chronic UTI, COPD (chronic obstructive pulmonary disease) (CMS-HCC), Coronary artery disease, Diabetes (CMS-HCC), Disease of thyroid gland, Fatigue, Hepatitis, Hepatitis, Hyperlipidemia, Hypertension, Migraine, and Sleep apnea. 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 Benefited from increased Keppra dose but now worse again and has some minor side effects of keppra. Will increase Clonazapem instead, we may need to try Baclofen instead if this does not improve.  Headache: Describes occipital neuralgia,  is not interested in lidocaine injection. Sumatriptan was helpful in the past, I will prescribe a limited supply again today  ??Plan  Patient Instructions   6. I will prescribe Sumatriptan 100 mg as needed for pain  7. Please increase Clonazepam to 1.5 tabs twice daily. Please stay at this dose for at least one week. If this is still not helping enough, please increase to 2 tabs twice daily.  8. No change to the Keppra dose at this time, please continue 1,000 mg twice daily  9. Follow up at your scheduled appointment in January   10.     Pt states , her symptoms wax and wane but she continues on higher dose BZ as directed by neurology.    - Prior history of depression, Rx with Prozac: Her PHQ 9 score today is 4 but she always feels tired due to her medical conditions.  I recommend she start Prozac 10 mg daily.       ROS  Constitutional:  Denies  unexpected weight loss or gain, or weakness   Eyes:  Denies visual changes  Respiratory:  Denies cough or shortness of breath. No change in exercise  tolerance  Cardiovascular:  Denies chest pain, palpitations or lower extremity swelling   GI:  Denies abdominal pain, diarrhea, constipation   Musculoskeletal:  Denies myalgias  Skin:  Denies nonhealing lesions  Neurologic:  Denies headache, focal weakness or numbness, tingling  Endocrine:  Denies polyuria or polydypsia   Psychiatric:  Denies depression, anxiety      Outpatient Medications Prior to Visit   Medication Sig Dispense Refill   ??? aspirin-calcium carbonate 81 mg-300 mg calcium(777 mg) Tab Take 81 mg by mouth.     ??? blood sugar diagnostic (ONETOUCH ULTRA TEST) Strp Test blood sugars once daily or as directed 100 strip 3   ??? clonazePAM (KLONOPIN) 0.5 MG tablet Please take 1.5 tabs twice daily as we discussed. 90 tablet 3   ??? d-mannose 500 mg cap Take 1,500 mg by mouth daily. 90 capsule 11   ??? docusate sodium (COLACE) 100 MG capsule Take 100 mg by mouth daily.     ??? empty container (SHARPS-A-GATOR DISPOSAL SYSTEM) Misc Use as directed for sharps disposal 1 each 2   ??? estradioL (ESTRACE) 0.01 % (0.1 mg/gram) vaginal cream Insert 2 g into the vagina Two (2) times a week. 16 g 11   ??? evolocumab (REPATHA SURECLICK) 140 mg/mL PnIj Inject the contents of 1 pen (140 mg) under the skin every fourteen (14) days. 2 mL 11   ??? ferrous gluconate 324 MG tablet Take 324 mg by mouth once.     ??? levETIRAcetam (KEPPRA) 1000 MG tablet Take 1 tablet (1,000 mg total) by mouth Two (2) times a day. 180 tablet 3   ??? lisinopriL (PRINIVIL,ZESTRIL) 20 MG tablet Take 1 tablet (20 mg total) by mouth daily. 90 tablet 3   ??? metFORMIN (GLUCOPHAGE) 500 MG tablet Take 2 tablets (1,000 mg total) by mouth in the morning and 2 tablets (1,000 mg total) in the evening. Take with meals. 360 tablet 1   ??? omeprazole 20 mg TbLD Take by mouth nightly as needed.     ??? pravastatin (PRAVACHOL) 10 MG tablet Take 0.5 tablets (5 mg total) by mouth daily. 45 tablet 3   ??? SUMAtriptan (IMITREX) 100 MG tablet Take one tab at onset of headache, may repeat in 2 hours if headache persists of recurs 9 tablet 5   ??? SUMAtriptan (IMITREX) 100  MG tablet Take 1 tablet (100 mg total) by mouth once as needed for migraine. 12 tablet 3   ??? SYNTHROID 100 mcg tablet Take 1 tablet (100 mcg total) by mouth daily. 90 tablet 1   ??? cephalexin (KEFLEX) 500 MG capsule Take 1 capsule (500 mg total) by mouth Two (2) times a day for 7 days. (Patient not taking: Reported on 07/15/2021) 14 capsule 0     No facility-administered medications prior to visit.         Objective:       Vital Signs  BP 124/72  - Pulse 78  - Temp 36.7 ??C (98 ??F) (Oral)  - Ht 167.6 cm (5' 6)  - Wt 82.7 kg (182 lb 4.8 oz)  - SpO2 98%  - BMI 29.42 kg/m??      Exam  General: normal appearance  EYES: Anicteric sclerae.  ENT: Oropharynx moist.  RESP: Relaxed respiratory effort. Clear to auscultation without wheezes or crackles.   CV: Regular rate and rhythm. Normal S1 and S2. No murmurs or gallops.  No lower extremity edema. Posterior tibial pulses are 2+ and symmetric.  abd exam: non tender, no masses, no HSM   MSK: No focal muscle tenderness.  SKIN: Appropriately warm and moist.  NEURO: Stable gait and coordination.    Allergies:     Patient has no known allergies.    Current Medications:     Current Outpatient Medications   Medication Sig Dispense Refill   ??? aspirin-calcium carbonate 81 mg-300 mg calcium(777 mg) Tab Take 81 mg by mouth.     ??? blood sugar diagnostic (ONETOUCH ULTRA TEST) Strp Test blood sugars once daily or as directed 100 strip 3   ??? clonazePAM (KLONOPIN) 0.5 MG tablet Please take 1.5 tabs twice daily as we discussed. 90 tablet 3   ??? d-mannose 500 mg cap Take 1,500 mg by mouth daily. 90 capsule 11   ??? docusate sodium (COLACE) 100 MG capsule Take 100 mg by mouth daily.     ??? empty container (SHARPS-A-GATOR DISPOSAL SYSTEM) Misc Use as directed for sharps disposal 1 each 2   ??? estradioL (ESTRACE) 0.01 % (0.1 mg/gram) vaginal cream Insert 2 g into the vagina Two (2) times a week. 16 g 11   ??? evolocumab (REPATHA SURECLICK) 140 mg/mL PnIj Inject the contents of 1 pen (140 mg) under the skin every fourteen (14) days. 2 mL 11   ??? ferrous gluconate 324 MG tablet Take 324 mg by mouth once.     ??? levETIRAcetam (KEPPRA) 1000 MG tablet Take 1 tablet (1,000 mg total) by mouth Two (2) times a day. 180 tablet 3   ??? lisinopriL (PRINIVIL,ZESTRIL) 20 MG tablet Take 1 tablet (20 mg total) by mouth daily. 90 tablet 3   ??? metFORMIN (GLUCOPHAGE) 500 MG tablet Take 2 tablets (1,000 mg total) by mouth in the morning and 2 tablets (1,000 mg total) in the evening. Take with meals. 360 tablet 1   ??? omeprazole 20 mg TbLD Take by mouth nightly as needed.     ??? pravastatin (PRAVACHOL) 10 MG tablet Take 0.5 tablets (5 mg total) by mouth daily. 45 tablet 3   ??? SUMAtriptan (IMITREX) 100 MG tablet Take one tab at onset of headache, may repeat in 2 hours if headache persists of recurs 9 tablet 5   ??? SUMAtriptan (IMITREX) 100 MG tablet Take 1 tablet (100 mg total) by mouth once as needed for migraine. 12 tablet 3   ??? FLUoxetine (  PROZAC) 10 MG tablet Take 1 tablet (10 mg total) by mouth daily. 90 tablet 3   ??? SYNTHROID 100 mcg tablet Take 1 tablet (100 mcg total) by mouth daily. 90 tablet 3     No current facility-administered medications for this visit.           Note - This record has been created using [employer]. Chart creation errors have been sought, but may not always have been located. Such creation errors do not reflect on the standard of medical care.    Amber Milliner, MD

## 2021-07-09 DIAGNOSIS — I2510 Atherosclerotic heart disease of native coronary artery without angina pectoris: Principal | ICD-10-CM

## 2021-07-10 NOTE — Unmapped (Signed)
[facility] Clinical Assessment & Refill Coordination Note    Amber Duncan, DOB: [birth year]  Phone: [phone] (home)     All above HIPAA information was verified with patient.     Was a Nurse, learning disability used for this call? No    Specialty Medication(s):   General Specialty: Repatha     Current Outpatient Medications   Medication Sig Dispense Refill   ??? aspirin-calcium carbonate 81 mg-300 mg calcium(777 mg) Tab Take 81 mg by mouth.     ??? blood sugar diagnostic (ONETOUCH ULTRA TEST) Strp Test blood sugars once daily or as directed 100 strip 3   ??? clonazePAM (KLONOPIN) 0.5 MG tablet Please take 1.5 tabs twice daily as we discussed. 90 tablet 3   ??? d-mannose 500 mg cap Take 1,500 mg by mouth daily. 90 capsule 11   ??? docusate sodium (COLACE) 100 MG capsule Take 100 mg by mouth daily. (Patient not taking: Reported on 06/17/2021)     ??? empty container (SHARPS-A-GATOR DISPOSAL SYSTEM) Misc Use as directed for sharps disposal (Patient not taking: Reported on 06/17/2021) 1 each 2   ??? estradioL (ESTRACE) 0.01 % (0.1 mg/gram) vaginal cream Insert 2 g into the vagina Two (2) times a week. 16 g 11   ??? evolocumab (REPATHA SURECLICK) 140 mg/mL PnIj Inject the contents of 1 pen (140 mg) under the skin every fourteen (14) days. 2 mL 11   ??? ferrous gluconate 324 MG tablet Take 324 mg by mouth once.     ??? levETIRAcetam (KEPPRA) 1000 MG tablet Take 1 tablet (1,000 mg total) by mouth Two (2) times a day. 180 tablet 3   ??? levothyroxine (SYNTHROID) 100 MCG tablet Take 1 tablet (100 mcg total) by mouth in the morning. 90 tablet 0   ??? lisinopriL (PRINIVIL,ZESTRIL) 20 MG tablet Take 1 tablet (20 mg total) by mouth daily. 90 tablet 3   ??? metFORMIN (GLUCOPHAGE) 500 MG tablet Take 2 tablets (1,000 mg total) by mouth in the morning and 2 tablets (1,000 mg total) in the evening. Take with meals. 360 tablet 1   ??? omeprazole 20 mg TbLD Take by mouth nightly as needed.     ??? pravastatin (PRAVACHOL) 10 MG tablet Take 0.5 tablets (5 mg total) by mouth daily. 45 tablet 3   ??? SUMAtriptan (IMITREX) 100 MG tablet Take one tab at onset of headache, may repeat in 2 hours if headache persists of recurs 9 tablet 5   ??? SUMAtriptan (IMITREX) 100 MG tablet Take 1 tablet (100 mg total) by mouth once as needed for migraine. 12 tablet 3     No current facility-administered medications for this visit.        Changes to medications: Yanett reports no changes at this time.    No Known Allergies    Changes to allergies: No    SPECIALTY MEDICATION ADHERENCE     Repatha 140 mg/ml: 0 days of medicine on hand       Medication Adherence    Patient reported X missed doses in the last month: all  Specialty Medication: Repatha 140 mg/mL  Informant: patient          Specialty medication(s) dose(s) confirmed: Regimen is correct and unchanged.     Are there any concerns with adherence? No. Ms Miltner has been off Repatha since July due to increased copay. Kennedy Bucker now reopened and she will plan to resume therapy as before.    Adherence counseling provided? Not needed  CLINICAL MANAGEMENT AND INTERVENTION      Clinical Benefit Assessment:    Do you feel the medicine is effective or helping your condition? Yes    Clinical Benefit counseling provided? Not needed    Adverse Effects Assessment:    Are you experiencing any side effects? No    Are you experiencing difficulty administering your medicine? No    Quality of Life Assessment:            How many days over the past month did your CAD  keep you from your normal activities? For example, brushing your teeth or getting up in the morning. Patient declined to answer    Have you discussed this with your provider? Not needed    Acute Infection Status:    Acute infections noted within Epic:  No active infections  Patient reported infection: None    Therapy Appropriateness:    Is therapy appropriate and patient progressing towards therapeutic goals? Yes, therapy is appropriate and should be continued    DISEASE/MEDICATION-SPECIFIC INFORMATION      For patients on injectable medications: Patient currently has 0 doses left.  Next injection is scheduled for asap.    PATIENT SPECIFIC NEEDS     - Does the patient have any physical, cognitive, or cultural barriers? No    - Is the patient high risk? No    - Does the patient require a Care Management Plan? No     - Does the patient require physician intervention or other additional services (i.e. nutrition, smoking cessation, social work)? No      SHIPPING     Specialty Medication(s) to be Shipped:   General Specialty: Repatha    Other medication(s) to be shipped: No additional medications requested for fill at this time     Changes to insurance: No    Delivery Scheduled: Yes, Expected medication delivery date: 07/11/21.     Medication will be delivered via Same Day Courier to the confirmed prescription address in [facility].    The patient will receive a drug information handout for each medication shipped and additional FDA Medication Guides as required.  Verified that patient has previously received a Conservation officer, historic buildings and a Surveyor, mining.    The patient or caregiver noted above participated in the development of this care plan and knows that they can request review of or adjustments to the care plan at any time.      All of the patient's questions and concerns have been addressed.    [name]   [facility] Specialty Pharmacist

## 2021-07-10 NOTE — Unmapped (Signed)
[facility] Specialty Medication Onboarding    Specialty Medication: REPATHA SURECLICK 140 mg/mL Pnij (evolocumab)  Prior Authorization: Approved   Financial Assistance: Yes - grant approved as secondary   Final Copay/Day Supply: $0 / 28    Insurance Restrictions: None     Notes to Pharmacist: n/a    The triage team has completed the benefits investigation and has determined that the patient is able to fill this medication at [facility]. Please contact the patient to complete the onboarding or follow up with the prescribing physician as needed.

## 2021-07-11 ENCOUNTER — Ambulatory Visit: Admit: 2021-07-11 | Discharge: 2021-07-12 | Payer: PRIVATE HEALTH INSURANCE

## 2021-07-11 DIAGNOSIS — D500 Iron deficiency anemia secondary to blood loss (chronic): Secondary | ICD-10-CM | POA: Diagnosis not present

## 2021-07-11 MED ADMIN — sodium ferric gluconate (FERRLECIT) 250 mg in sodium chloride (NS) 0.9 % 100 mL IVPB: 250 mg | INTRAVENOUS | @ 16:00:00 | Stop: 2021-07-11

## 2021-07-11 MED ADMIN — acetaminophen (TYLENOL) tablet 650 mg: 650 mg | ORAL | @ 15:00:00 | Stop: 2021-07-11

## 2021-07-11 MED ADMIN — cetirizine (ZyrTEC) tablet 10 mg: 10 mg | ORAL | @ 15:00:00 | Stop: 2021-07-11

## 2021-07-11 MED FILL — REPATHA SURECLICK 140 MG/ML SUBCUTANEOUS PEN INJECTOR: SUBCUTANEOUS | 28 days supply | Qty: 2 | Fill #8

## 2021-07-11 NOTE — Unmapped (Signed)
Pt presents for 2/4 Ferrlicet infusion.  VSS, PIV started to left arm.  Pt aware of potential reaction/side effects, call bell within reach.    1209 Ferrlicet 250mg  started  1403 Infusion complete.      Pt tolerated without complication, VSS.  IV flushed per policy and d/c'd, gauze and coban applied.  Pt left clinic in no acute distress.

## 2021-07-12 MED ORDER — SYNTHROID 100 MCG TABLET
ORAL_TABLET | 0 refills | 0 days
Start: 2021-07-12 — End: ?

## 2021-07-13 MED ORDER — SYNTHROID 100 MCG TABLET
ORAL_TABLET | Freq: Every day | ORAL | 1 refills | 90.00000 days | Status: CP
Start: 2021-07-13 — End: 2022-07-13

## 2021-07-14 NOTE — Unmapped (Signed)
Hi,     Amber Duncan contacted the Communication Center requesting to speak with the care team of Amber Duncan to discuss:    Amber Duncan would like to schedule clinic appt and mammogram. She has some questions so please call before scheduling.    Please contact Amber Duncan at [phone].    Thank you,   Yehuda Mao  [facility]   [phone]

## 2021-07-15 ENCOUNTER — Ambulatory Visit: Admit: 2021-07-15 | Discharge: 2021-07-16 | Payer: PRIVATE HEALTH INSURANCE

## 2021-07-15 DIAGNOSIS — E039 Hypothyroidism, unspecified: Principal | ICD-10-CM

## 2021-07-15 DIAGNOSIS — I10 Essential (primary) hypertension: Principal | ICD-10-CM

## 2021-07-15 DIAGNOSIS — R350 Frequency of micturition: Principal | ICD-10-CM

## 2021-07-15 DIAGNOSIS — F32A Depression, unspecified depression type: Principal | ICD-10-CM

## 2021-07-15 DIAGNOSIS — N390 Urinary tract infection, site not specified: Principal | ICD-10-CM

## 2021-07-15 DIAGNOSIS — Z170 Estrogen receptor positive status [ER+]: Principal | ICD-10-CM

## 2021-07-15 DIAGNOSIS — E782 Mixed hyperlipidemia: Principal | ICD-10-CM

## 2021-07-15 DIAGNOSIS — R5382 Chronic fatigue, unspecified: Principal | ICD-10-CM

## 2021-07-15 DIAGNOSIS — Z862 Personal history of diseases of the blood and blood-forming organs and certain disorders involving the immune mechanism: Principal | ICD-10-CM

## 2021-07-15 DIAGNOSIS — I2510 Atherosclerotic heart disease of native coronary artery without angina pectoris: Principal | ICD-10-CM

## 2021-07-15 DIAGNOSIS — C50312 Malignant neoplasm of lower-inner quadrant of left female breast: Principal | ICD-10-CM

## 2021-07-15 DIAGNOSIS — J383 Other diseases of vocal cords: Principal | ICD-10-CM

## 2021-07-15 DIAGNOSIS — D500 Iron deficiency anemia secondary to blood loss (chronic): Principal | ICD-10-CM

## 2021-07-15 LAB — LIPID PANEL
CHOLESTEROL/HDL RATIO SCREEN: 3.5 (ref 1.0–4.5)
CHOLESTEROL: 169 mg/dL (ref <=200)
HDL CHOLESTEROL: 48 mg/dL (ref 40–60)
LDL CHOLESTEROL CALCULATED: 67 mg/dL (ref 40–99)
NON-HDL CHOLESTEROL: 121 mg/dL (ref 70–130)
TRIGLYCERIDES: 269 mg/dL — ABNORMAL HIGH (ref 0–150)
VLDL CHOLESTEROL CAL: 53.8 mg/dL — ABNORMAL HIGH (ref 11–41)

## 2021-07-15 MED ORDER — FLUOXETINE 10 MG TABLET
ORAL_TABLET | Freq: Every day | ORAL | 3 refills | 90 days | Status: CP
Start: 2021-07-15 — End: 2021-10-13

## 2021-07-15 MED ORDER — SYNTHROID 100 MCG TABLET
ORAL_TABLET | Freq: Every day | ORAL | 3 refills | 90 days | Status: CP
Start: 2021-07-15 — End: 2022-07-15

## 2021-07-15 NOTE — Unmapped (Signed)
Patient called stating her prescriptions were not received by the pharmacy.  [facility] and [facility] do have both the synthroid and fluoxetine ready for pick up

## 2021-07-16 NOTE — Unmapped (Signed)
Lipid panel with acceptable LDL cholesterol of 67.

## 2021-07-21 ENCOUNTER — Ambulatory Visit: Admit: 2021-07-21 | Discharge: 2021-07-22 | Payer: PRIVATE HEALTH INSURANCE

## 2021-07-21 DIAGNOSIS — D500 Iron deficiency anemia secondary to blood loss (chronic): Secondary | ICD-10-CM | POA: Diagnosis not present

## 2021-07-21 DIAGNOSIS — Z79899 Other long term (current) drug therapy: Secondary | ICD-10-CM | POA: Diagnosis not present

## 2021-07-21 MED ADMIN — cetirizine (ZyrTEC) tablet 10 mg: 10 mg | ORAL | @ 15:00:00 | Stop: 2021-07-21

## 2021-07-21 MED ADMIN — acetaminophen (TYLENOL) tablet 650 mg: 650 mg | ORAL | @ 15:00:00 | Stop: 2021-07-21

## 2021-07-21 MED ADMIN — sodium ferric gluconate (FERRLECIT) 250 mg in sodium chloride (NS) 0.9 % 100 mL IVPB: 250 mg | INTRAVENOUS | @ 16:00:00 | Stop: 2021-07-21

## 2021-07-21 NOTE — Unmapped (Signed)
I attempted multiple times to return call to pt at phone number listed but my call was unable to be connected. I am not able to reach this pt to answer questions.

## 2021-07-22 NOTE — Unmapped (Signed)
Pt presents for Ferrlicet infusion.  VSS, IV placed in RAC.  Pt aware of potential reaction/side effects, call bell within reach.  1101 Ferrlicet 250 mg started  1251 Infusion complete.  Pt tolerated without complication, VSS.  IV flushed per policy and d/c'd, gauze and coban applied.  Pt left clinic in no acute distress

## 2021-07-24 NOTE — Unmapped (Incomplete)
Here is your personalized prevention plan based on your Annual Wellness Visit today.    Medicare Screening & Prevention Guidelines Recommendations Dates Completed HM Status and Next Due Follow-Up   Colorectal Cancer Screening Patients 50 to 75: stool cards annually OR colonoscopy every 10 years (or more frequently if high risk) OR FIT-DNA every 3 years.  Colonoscopy date: 01/21/2017  FOBT/FIT date: Not Found  Sigmoidoscopy date: Not Found  FIT-DNA date: Not Found Health Maintenance Summary    -      Colonoscopy (Every 10 Years) Next due on 11/04/2029    11/05/2019  Outside Procedure: COLONOSCOPY     01/28/2017  Outside Procedure: COLONOSCOPY     01/21/2017  Surgical Procedure: PR COLONOSCOPY,ABLATE LESION     01/21/2017  Surgical Procedure: PR COLSC FLX W/RMVL OF TUMOR POLYP LESION   SNARE TQ     01/21/2017  Colonoscopy              Up to date - next due 11/04/2029   DEXA Bone Density Measurement Patients age 41-95 to have a Dexa every 5 years in postmenopausal women and high risk males. Males will defer to PCP DEXA date: 11/18/2020 Health Maintenance Summary    -      DEXA Scan-Start Age 15 (Every 5 Years) Next due on 11/18/2025    11/18/2020  XR Dexa Bone Density Skeletal              Up to date - next due 11/18/2025   Diabetes Eye Exam Annually if Diabetic. Eye Exam date: Not Found Health Maintenance Summary    -      Overdue - Retinal Eye Exam (Yearly) Overdue - never done    No completion history exists for this topic.           DUE   Diabetes Foot Exam Annually if Diabetic. Most Recent Foot Exam Date: Not Found Health Maintenance Summary    -      Postponed - Foot Exam (Yearly) Postponed until 01/13/2022    08/13/2017  SmartData: WORKFLOW - DIABETES - DIABETIC FOOT EXAM PERFORMED       08/12/2017  SmartData: WORKFLOW - DIABETES - DIABETIC FOOT EXAM PERFORMED       08/10/2017  SmartData: WORKFLOW - DIABETES - DIABETIC FOOT EXAM PERFORMED       07/19/2017  SmartData: WORKFLOW - DIABETES - DIABETIC FOOT EXAM PERFORMED 07/14/2017  SmartData: WORKFLOW - DIABETES - DIABETIC FOOT EXAM PERFORMED       Only the first 5 history entries have been loaded, but more history   exists.           Up to date   Diabetes Urine Albumin/Creatinine Ratio Annually if Diabetic UACR Date: 10/11/2020   Health Maintenance Summary    -      Urine Albumin/Creatinine Ratio (Yearly) Next due on 10/11/2021    10/11/2020  Albumin/Creatinine Ratio component of Albumin/creatinine   urine ratio              Up to date   Diabetes Hemoglobin A1c Every 3 or 6 months depending on last result Last Hemoglobin A1c Date: 05/15/2021 Health Maintenance Summary    -      Hemoglobin A1c (Every 6 Months) Next due on 11/12/2021    05/15/2021  HGB A1C, RAP/PDS component of POCT glycosylated hemoglobin   (Hb A1C)     10/11/2020  HGB A1C, RAP/PDS component of POCT glycosylated hemoglobin   (Hb A1C)  01/30/2020  Outside Procedure: CHG GLYCOSYLATED HEMOGLOBIN TEST     09/19/2019  Outside Procedure: CHG GLYCOSYLATED HEMOGLOBIN TEST     06/13/2019  Outside Procedure: CHG GLYCOSYLATED HEMOGLOBIN TEST     Only the first 5 history entries have been loaded, but more history   exists.           Up to date   Heart Disease Screening (fasting lipid panel) Minimum of every 5 years, patients age 34-75,  if no apparent signs or symptoms of heart disease. LDL date: 07/15/2021  Total choleseterol date: 07/15/2021  HDL date: 07/15/2021  Triglycerides date: 07/15/2021 Health Maintenance Summary     This patient has no relevant Health Maintenance data.       Up to date   Mammogram Screening Age 38-74 every 2 years.  Mammogram date: 01/28/2021 Health Maintenance Summary    -      Ordered - Mammogram Start Age 70 (Every 2 Years) Ordered on 01/28/2021    01/28/2021  Mammo Digital Diagnostic Tomo Bilateral     08/05/2020  Mammo Diagnostic Left     11/15/2019  Outside Procedure: CHG MAMMOGRAPHY; UNILATERAL     11/07/2019  Outside Procedure: CHG MAMMOGRAPHY; UNILATERAL     10/24/2019  Outside Procedure: CHG SCREENING MAMMOGRAPHY BILATERAL     Only the first 5 history entries have been loaded, but more history   exists.           DUE (has been ordered)   Pelvic Exam & Pap Smear Women ages 58 to 90 every 3 years with negative cytology (pap smear)  OR,   women ages 17 to 7 every 5 years if they have had both a negative pap and human papillomavirus (HPV) OR,  Every 3 years if they had a positive HPV result Pap Smear date: Not Found  HPV date: Not Found Health Maintenance Summary     This patient has no relevant Health Maintenance data.     Not within age range   Hepatitis C Screening A one-time screening for HCV infection for adults age 90 to 73 years old.  HCV screening date: 01/20/2017 Health Maintenance Summary    -      Hepatitis C Screen  Completed    01/20/2017  Hepatitis C Ab component of Hepatitis C antibody              Complete   Covid-19 Vaccine For persons 5 and older @[id number] @ Health Maintenance   Topic Date Due    COVID-19 Vaccine (4 - Booster for Pfizer series) 07/26/2020      Up to date   Tdap Every 10 years (will not be covered by Medicare) DTap/Tdap/TD vaccination: Not Found Health Maintenance Summary    -      Overdue - DTaP/Tdap/Td Vaccines (1 - Tdap) Overdue - never done    No completion history exists for this topic.           Provided information on how to obtain at pharmacy (tdap)   Influenza Vaccine Annually  Influenza Vaccination: 05/15/2021   Health Maintenance Summary    -      Influenza Vaccine (Series Information) Completed    05/15/2021  Imm Admin: INFLUENZA INJ MDCK PF, QUAD,(FLUCELVAX)(48MO AND UP   EGG FREE)     05/17/2020  Imm Admin: Influenza Virus Vaccine, unspecified formulation     04/27/2019  Imm Admin: INFLUENZA HIGH DOSE(IIV4)FLUZONE 85YRS+     05/24/2018  Imm Admin: INFLUENZA HIGH DOSE(IIV4)FLUZONE 85YRS+  06/14/2017  Imm Admin: Influenza Virus Vaccine, unspecified formulation     Only the first 5 history entries have been loaded, but more history exists.             Up to date    Pneumococcal Vaccine  For people ?65, or with an underlying condition Pneumonia vaccination: 04/27/2019   Health Maintenance Summary    -      Pneumococcal Vaccine 65+ (Series Information) Completed    04/27/2019  Imm Admin: PNEUMOCOCCAL POLYSACCHARIDE 23     02/09/2018  Imm Admin: Pneumococcal Conjugate 13-Valent     02/21/2016  Imm Admin: PNEUMOCOCCAL POLYSACCHARIDE 23     09/10/2014  Imm Admin: Pneumococcal Conjugate 13-Valent     09/08/2007  Imm Admin: PNEUMOCOCCAL POLYSACCHARIDE 23              Complete   Zoster Vaccine Healthy adults 50 years and older receive 2 doses of recombinant zoster vaccine two to six months apart (may not be covered by Medicare).  Zoster vaccination: Not Found Health Maintenance Summary    -      Overdue - Zoster Vaccines (1 of 2) Overdue since 06/22/2011    04/27/2011  Imm Admin: ZOSTAVAX - ZOSTER VACCINE, LIVE, SQ            Provided information on how to obtain at pharmacy (shingrix)

## 2021-07-25 ENCOUNTER — Ambulatory Visit
Admit: 2021-07-25 | Discharge: 2021-07-26 | Payer: PRIVATE HEALTH INSURANCE | Attending: Adult Health | Primary: Adult Health

## 2021-07-25 DIAGNOSIS — Z170 Estrogen receptor positive status [ER+]: Principal | ICD-10-CM

## 2021-07-25 DIAGNOSIS — C50812 Malignant neoplasm of overlapping sites of left female breast: Principal | ICD-10-CM

## 2021-07-25 DIAGNOSIS — Z6829 Body mass index (BMI) 29.0-29.9, adult: Secondary | ICD-10-CM | POA: Diagnosis not present

## 2021-07-25 NOTE — Unmapped (Signed)
It was a pleasure to see you today in the Breast Surgical Oncology Clinic. Please call my nurse navigator, Alinda Dooms, if you have any interval questions or concerns.     Follow up in 1 year for exam.  Continue surveillance as outlined. NCCN recommends that you continue to see your primary care provider for all general health care recommended for a patient your age, including cancer screening tests.  Any symptoms should be brought to the attention of your provider.  You should see your medical oncologist every 3 to 6 months for the first 3 years, every 6 to 12 months for years 4 and 5, and annually thereafter. Visits with your surgeon and radiation oncologist should be rotated with your medical oncology team initially until you are released to medical oncology or a survivorship clinic. The surgery visit should typically be paired with the imaging appointment so that we can review mammogram findings and recommendations.     Surgical oncology contact information:  For appointments, please call [phone].     Nurse Navigator available Monday through Friday 8:30 am to 4:30 pm:   Alinda Dooms, RN:    Phone: [phone]   Fax: [phone] attention Alinda Dooms, RN    For emergencies, evenings or weekends, please call [phone] and ask for the surgical oncology resident on call. Please be aware that this person is also responding to in-hospital emergencies and patient issues and may not answer your phone call immediately, but will return your call as soon as possible.

## 2021-07-25 NOTE — Unmapped (Signed)
Patient Name: Amber Duncan  Patient Age: 70 y.o.  Encounter Date: 07/25/2021    Referring Physician:   Peterson Lombard, MD  [address]  [place],  [place] [address]    Primary Care Provider:  [name], MD    Breast Cancer Treatment Team:  Surgical Oncology: Sherilyn Cooter, DO  Surgical Oncology NP: Pollyann Samples, NP  Radiation Oncology: Rayetta Humphrey, MD  Medical Oncology: Royal Hawthorn, MD (has also been followed by Rickard Patience, MD at [facility])    Reason for Visit: Breast cancer surveillance visit    HPI:  Amber Duncan is a 70 y.o. female who presents for followup after breast cancer surgery. Below is a synopsis of past and present health information pertinent to her breast cancer diagnosis, treatment and current health status. She is unaccompanied today. Jerricka was last seen in clinic on 08/05/2020. Since that visit she denies associated symptoms including redness, fever, chills, breast mass, breast retraction, nipple inversion, breast swelling or nipple discharge.  She does not an occasional right sided fleeting breast pain that does not require intervention.  She notes bilateral hip pain and pain in her left upper arm at the humerus that was present prior to her cancer diagnosis that is unchanged. She is having trouble with iron deficiency anemia and has been receiving iron infusions.  She denies any BRBPR, but does not black and tarry stools.  She does take oral iron.  She has a history of GI bleed and her been referred to GI but has not been called for an appointment. She notes headaches that she relates to occipital neuralgia.  No lymphedema or difficulty with ROM.  She has started on vaginal estradiol for recurrent UTIs.  No other specific complaints today.   No other changes to her medical, surgical, family or social history.     Historically, Audry presented with an abnormal screening mammogram    Diagnosis:    1. Left breast infiltrating ductal carcinoma Cancer Staging   Malignant neoplasm of lower-inner quadrant of left breast in female, estrogen receptor positive (CMS-HCC)  Staging form: Breast, AJCC 8th Edition  - Clinical stage from 12/27/2019: Stage IA (cT1b, cN0, cM0, G1, ER+, PR+, HER2-) - Signed by Talbert Cage, DO on 12/27/2019  - Pathologic stage from 01/29/2020: Stage IA (pT1b, pN0, cM0, G1, ER+, PR+, HER2-) - Signed by Talbert Cage, DO on 02/06/2020      Procedure:   1. Left US guided partial mastectomy, performed on 01/19/2020.  SLNBx omitted per [facility] criteria.    Clinical Trial Participant:   1. No clinical trials at this time.    Medical Oncology: declined by patient with Royal Hawthorn, MD    Radiation Oncology: whole breast irradiation with Rayetta Humphrey, MD    Review of Systems:  10 organ systems reviewed and pertinent as noted in HPI.       Medical history reviewed as below. I have also reviewed additional records as provided.  Oncology History Overview Note   2021: Left breast pT1b N0 IDC, G1, +/+/-     Malignant neoplasm of lower-inner quadrant of left breast in female, estrogen receptor positive (CMS-HCC)   10/24/2019 -  Presenting Symptoms    Abnormal screening MMG: asymmetry in the left breast     11/07/2019 Interval Scan(s)    MMG with tomo: Left breast LOQ small mass with associated distortion.  [place]: Left breast 7 x 4 x 6 mm irregular hypoechoic mass at the 5 o???clock  4 CFN position.     11/15/2019 Biopsy    Left breast stereotactic biopsy 5:00, 4CFN: IDC, G1, ER 95% positive, PR 80% positive. HER2- (1+) and associated DCIS.     12/26/2019 Initial Diagnosis    Malignant neoplasm of lower-inner quadrant of left breast in female, estrogen receptor positive (CMS-HCC)     12/27/2019 Tumor Board    MDC recs: Left breast cT1b N0 IDC, G1, +/+/-. 7 mm on imaging review, excellent candidate for BCT. Candidate to omit axillary staging. Possible candidate to omit radiation. Will see med/onc post op.      12/27/2019 -  Cancer Staged    Staging form: Breast, AJCC 8th Edition  - Clinical stage from 12/27/2019: Stage IA (cT1b, cN0, cM0, G1, ER+, PR+, HER2-) - Signed by Talbert Cage, DO on 12/27/2019       01/19/2020 Surgery    Left US-guided partial mastectomy performed by Dr. Dellis Anes. Final pathology showed IDC 7mm in greatest dimension with DCIS grade 2, all margins clear     01/29/2020 -  Cancer Staged    Staging form: Breast, AJCC 8th Edition  - Pathologic stage from 01/29/2020: Stage IA (pT1b, pN0, cM0, G1, ER+, PR+, HER2-) - Signed by Talbert Cage, DO on 02/06/2020         Past Medical History:   Diagnosis Date   ??? Breast cancer (CMS-HCC)    ??? Chronic UTI    ??? COPD (chronic obstructive pulmonary disease) (CMS-HCC)    ??? Coronary artery disease    ??? Diabetes (CMS-HCC)    ??? Disease of thyroid gland    ??? Fatigue    ??? Hepatitis    ??? Hepatitis    ??? Hyperlipidemia    ??? Hypertension    ??? Migraine    ??? Sleep apnea      Past Surgical History:   Procedure Laterality Date   ??? BACK SURGERY     ??? BREAST BIOPSY     ??? BREAST LUMPECTOMY Left 01/2020   ??? CATARACT EXTRACTION     ??? CHOLECYSTECTOMY     ??? CORONARY STENT PLACEMENT     ??? HYSTERECTOMY     ??? KNEE SURGERY     ??? PR COLONOSCOPY,ABLATE LESION N/A 01/21/2017    Procedure: COLONOSCOPY FLEX; W/ABLAT LES NOT AMENABLE-SNARE;  Surgeon: Luanne Bras, MD;  Location: GI PROCEDURES [facility];  Service: Gastroenterology   ??? PR COLSC FLX W/RMVL OF TUMOR POLYP LESION SNARE TQ N/A 01/21/2017    Procedure: COLONOSCOPY FLEX; W/REMOV TUMOR/LES BY SNARE;  Surgeon: Luanne Bras, MD;  Location: [facility]EDURES [facility];  Service: Gastroenterology   ??? PR MASTECTOMY, PARTIAL Left 01/19/2020    Procedure: MASTECTOMY, PARTIAL (EG, LUMPECTOMY, TYLECTOMY, QUADRANTECTOMY, SEGMENTECTOMY);  Surgeon: Talbert Cage, DO;  Location: MAIN OR [facility];  Service: Surgical Oncology Breast   ??? PR UPPER GI ENDOSCOPY,BIOPSY N/A 04/29/2018    Procedure: UGI ENDOSCOPY; WITH BIOPSY, SINGLE OR MULTIPLE;  Surgeon: Rona Ravens, MD;  [facility]: [facility];  Service: Gastroenterology   ??? RADIATION Left    ??? REDUCTION MAMMAPLASTY     ??? REPLACEMENT TOTAL KNEE Right      Family History   Problem Relation Age of Onset   ??? Cancer Brother         liver   ??? Cancer Maternal Aunt    ??? Cancer Maternal Uncle    ??? Diabetes Paternal Uncle    ??? Other Mother  MVA   ??? Other Father         MVA   ??? No Known Problems Sister    ??? No Known Problems Son    ??? Cancer Brother         Lung   ??? Other Sister         MVA   ??? Other Sister         MVA drunk driver   ??? No Known Problems Brother    ??? No Known Problems Daughter    ??? No Known Problems Maternal Grandmother    ??? No Known Problems Maternal Grandfather    ??? No Known Problems Paternal Grandmother    ??? No Known Problems Paternal Grandfather    ??? No Known Problems Other    ??? Heart failure Neg Hx    ??? BRCA 1/2 Neg Hx    ??? Breast cancer Neg Hx    ??? Colon cancer Neg Hx    ??? Endometrial cancer Neg Hx    ??? Ovarian cancer Neg Hx      Social History     Socioeconomic History   ??? Marital status: Divorced   Tobacco Use   ??? Smoking status: Former     Packs/day: 2.00     Years: 48.00     Pack years: 96.00     Types: Cigarettes     Quit date: 11/06/2011     Years since quitting: 9.7   ??? Smokeless tobacco: Never   Vaping Use   ??? Vaping Use: Never used   Substance and Sexual Activity   ??? Alcohol use: No   ??? Drug use: No   Social History Narrative    She lives in [place]. 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 She denies alcohol and illicit drug use.       Social Determinants of Health     Financial Resource Strain: Medium Risk   ??? Difficulty of Paying Living Expenses: Somewhat hard   Food Insecurity: Food Insecurity Present   ??? Worried About Programme researcher, broadcasting/film/video in the Last Year: Sometimes true   ??? Ran Out of Food in the Last Year: Never true   Transportation Needs: No Transportation Needs   ??? Lack of Transportation (Medical): No   ??? Lack of Transportation (Non-Medical): No       Current Outpatient Medications:   ???  aspirin-calcium carbonate 81 mg-300 mg calcium(777 mg) Tab, Take 81 mg by mouth., Disp: , Rfl:   ???  blood sugar diagnostic (ONETOUCH ULTRA TEST) Strp, Test blood sugars once daily or as directed, Disp: 100 strip, Rfl: 3  ???  clonazePAM (KLONOPIN) 0.5 MG tablet, Please take 1.5 tabs twice daily as we discussed., Disp: 90 tablet, Rfl: 3  ???  d-mannose 500 mg cap, Take 1,500 mg by mouth daily., Disp: 90 capsule, Rfl: 11  ???  docusate sodium (COLACE) 100 MG capsule, Take 100 mg by mouth daily., Disp: , Rfl:   ???  empty container (SHARPS-A-GATOR DISPOSAL SYSTEM) Misc, Use as directed for sharps disposal, Disp: 1 each, Rfl: 2  ???  estradioL (ESTRACE) 0.01 % (0.1 mg/gram) vaginal cream, Insert 2 g into the vagina Two (2) times a week., Disp: 16 g, Rfl: 11  ???  evolocumab (REPATHA SURECLICK) 140 mg/mL PnIj, Inject the contents of 1 pen (140 mg) under the skin every fourteen (14) days., Disp: 2 mL, Rfl: 11  ???  ferrous  gluconate 324 MG tablet, Take 324 mg by mouth once., Disp: , Rfl:   ???  FLUoxetine (PROZAC) 10 MG tablet, Take 1 tablet (10 mg total) by mouth daily., Disp: 90 tablet, Rfl: 3  ???  levETIRAcetam (KEPPRA) 1000 MG tablet, Take 1 tablet (1,000 mg total) by mouth Two (2) times a day., Disp: 180 tablet, Rfl: 3  ???  lisinopriL (PRINIVIL,ZESTRIL) 20 MG tablet, Take 1 tablet (20 mg total) by mouth daily., Disp: 90 tablet, Rfl: 3  ???  metFORMIN (GLUCOPHAGE) 500 MG tablet, Take 2 tablets (1,000 mg total) by mouth in the morning and 2 tablets (1,000 mg total) in the evening. Take with meals., Disp: 360 tablet, Rfl: 1  ???  omeprazole 20 mg TbLD, Take by mouth nightly as needed., Disp: , Rfl:   ???  pravastatin (PRAVACHOL) 10 MG tablet, Take 0.5 tablets (5 mg total) by mouth daily., Disp: 45 tablet, Rfl: 3  ???  SUMAtriptan (IMITREX) 100 MG tablet, Take one tab at onset of headache, may repeat in 2 hours if headache persists of recurs, Disp: 9 tablet, Rfl: 5  ???  SUMAtriptan (IMITREX) 100 MG tablet, Take 1 tablet (100 mg total) by mouth once as needed for migraine., Disp: 12 tablet, Rfl: 3  ???  SYNTHROID 100 mcg tablet, Take 1 tablet (100 mcg total) by mouth daily., Disp: 90 tablet, Rfl: 3  No Known Allergies      Physical Examination:   Blood pressure 139/73, pulse 60, temperature 36.3 ??C (97.3 ??F), temperature source Temporal, resp. rate 16, weight 83.7 kg (184 lb 9.6 oz), SpO2 99 %.  General Appearance:  No acute distress, well appearing and well nourished.   Head:  Normocephalic, atraumatic.   Eyes:  Conjuctiva and lids appear normal. Pupils equal and round,   sclera anicteric.   Ears:  Overall appearance normal with no scars, lesions or               masses.  Hearing is grossly normal.   Neck: Supple, symmetrical, trachea midline, no adenopathy;   thyroid: no enlargement/tenderness/nodules.   Pulmonary:    Normal respiratory effort.    Musculoskeletal: Normal gait. Extremities without clubbing, cyanosis, or           edema.   Skin: Skin color, texture, turgor normal, no rashes or lesions.   Neurologic: No motor abnormalities noted. Sensation grossly intact.   Lymphatic:  Breast: No cervical or supraclavicular LAD noted.   A comprehensive examination of the breasts and chest wall was performed with the patient upright and supine with arms at her sides and above her head.   Left breast normal in size, normal symmetry, normal contour, no dimpling, nipple everted, nipple exhibits no crusting or excoriation, no masses or nodules, no palpable axillary adenopathy. Skin is slightly hyperpigmented from prior radiation. The breast is tender to palpation. infraclavicular and supraclavicular nodal examinations are negative and arm circumference appears symmetric without evidence of lymphedema. Cosmetic outcome is excellent.  Right breast normal in size, normal symmetry, normal contour, no dimpling, no skin changes, nipple everted, nipple exhibits no crusting or excoriation, no masses or nodules, no palpable axillary adenopathy. No point tenderness on exam.  No tenderness to palpation around the scapula that radiates to the breast.    Psychiatric: Judgement and insight appropriate. Oriented to person,         place, and time.       Imaging Review:  I personally reviewed all imaging, including diagnostic mammogram with spot  compression/magnification views.     EXAM: MAMMO DIGITAL DIAGNOSTIC TOMO BILATERAL  DATE: 01/28/2021 11:12 Am  ACCESSION: [id number]  DICTATED: 01/28/2021 11:39 AM  INTERPRETATION LOCATION: [facility]  ??  CLINICAL INDICATION: 70 years old Female with PERSONAL HX MALIG NEOPLASM BREAST ; PERSONAL HX MALIG NEOPLASM BREAST  - C50.812 - Malignant neoplasm of overlapping sites of left breast in female, estrogen receptor positive (CMS - HCC) - Z17.0 - Malignant neoplasm of overlapping sites of left breast in.  History of left breast IDC status post lumpectomy in 2021. Two palpable lumps noted for few weeks in left breast.  ??  COMPARISON: [place] and outside comparison 2021, outside comparison 2020  ??  TECHNIQUE: Full-field digital diagnostic and spot 2D/3D mammogram and magnification views.  ??  Tomosynthesis imaging was used to improve the sensitivity and specificity of the detection of breast cancer.  ??  BREAST DENSITY: b - There are scattered areas of fibroglandular density.  ??  FINDINGS:  At the lumpectomy site in the lower, central left breast, there is expected density and architectural distortion. A bb marks the sites of focal concern in the left breast.  There are no suspicious masses, malignant calcifications, sites of non-surgical architectural distortion, or asymmetries subjacent to the bb or elsewhere in either breast. Stable asymmetry and benign calcifications in the retroareolar right breast. Immediately beneath the areas of concern involving the left breast are areas of normal fatty tissue.  ??  ASSESSMENT:  BI-RADS Category: 2-Mammo1Yr : Benign.  Annual mammography is recommended.  ??  Recommendation Laterality: Both   Diagnostic mammogram is recommended. Clinical follow-up in recommended for left breast palpable areas that correlate with fatty tissue on imaging.       I have personally reviewed the breast imaging, laboratory values, existing medical records, and pathology. I have summarized these findings and my interpretation in the oncology history and assessment above.    Impression:  Ms. Appleman is a 70 y.o. female with a history of left breast infiltrating ductal carcinoma, estrogen receptor positive, progesterone receptor positive and HER2 negative. See treatment summary above.   Staging   Cancer Staging   Malignant neoplasm of lower-inner quadrant of left breast in female, estrogen receptor positive (CMS-HCC)  Staging form: Breast, AJCC 8th Edition  - Clinical stage from 12/27/2019: Stage IA (cT1b, cN0, cM0, G1, ER+, PR+, HER2-) - Signed by Talbert Cage, DO on 12/27/2019  - Pathologic stage from 01/29/2020: Stage IA (pT1b, pN0, cM0, G1, ER+, PR+, HER2-) - Signed by Talbert Cage, DO on 02/06/2020    There is no evidence of disease at 1.5 year.    Recommendations:  1. Patient declined endocrine therapy and is comfortable with this decision. She has started topical vaginal estrogen for recurrent UTIs. Given her history of hormone fed breast cancer, we recommend using in this in as low of a dose as possible for as short of a term as possible to control her symptoms.   2. Follow-up in six months with rad/onc with bilateral mammogram as scheduled.  3. RTC with [place] in one year for exam, sooner for any new breast complaints.   4. Discussed options for management of her breast pain.  As it is not constant, she declines therapy.  5. Referral was previously placed to GI given her prior GI bleed and need for IV iron infusions.  She was provided with their contact information.  Message also being sent by me to the schedulers. All of the patient's questions and  concerns were addressed during the visit and she is in agreement with the plan of care. Please do not hesitate to contact me with questions or concerns.     Cancer Staging   Malignant neoplasm of lower-inner quadrant of left breast in female, estrogen receptor positive (CMS-HCC)  Staging form: Breast, AJCC 8th Edition  - Clinical stage from 12/27/2019: Stage IA (cT1b, cN0, cM0, G1, ER+, PR+, HER2-) - Signed by Talbert Cage, DO on 12/27/2019  - Pathologic stage from 01/29/2020: Stage IA (pT1b, pN0, cM0, G1, ER+, PR+, HER2-) - Signed by Talbert Cage, DO on 02/06/2020      Pollyann Samples, NP-C  Surgical Breast Oncology  07/25/2021  9:48 AM

## 2021-07-28 ENCOUNTER — Ambulatory Visit: Admit: 2021-07-28 | Discharge: 2021-07-29 | Payer: PRIVATE HEALTH INSURANCE

## 2021-07-28 DIAGNOSIS — D500 Iron deficiency anemia secondary to blood loss (chronic): Secondary | ICD-10-CM | POA: Diagnosis not present

## 2021-07-28 MED ADMIN — cetirizine (ZyrTEC) tablet 10 mg: 10 mg | ORAL | @ 16:00:00 | Stop: 2021-07-28

## 2021-07-28 MED ADMIN — acetaminophen (TYLENOL) tablet 650 mg: 650 mg | ORAL | @ 16:00:00 | Stop: 2021-07-28

## 2021-07-28 MED ADMIN — sodium ferric gluconate (FERRLECIT) 250 mg in sodium chloride (NS) 0.9 % 100 mL IVPB: 250 mg | INTRAVENOUS | @ 17:00:00 | Stop: 2021-07-28

## 2021-07-29 NOTE — Unmapped (Signed)
[facility] Refill Coordination Note    Specialty Medication(s) to be Shipped:   General Specialty: Repatha    Other medication(s) to be shipped: No additional medications requested for fill at this time     Amber Duncan, DOB: 01-29-1951  Phone: [phone] (home)       All above HIPAA information was verified with patient.     Was a Nurse, learning disability used for this call? No    Completed refill call assessment today to schedule patient's medication shipment from the [facility] [phone]).  All relevant notes have been reviewed.     Specialty medication(s) and dose(s) confirmed: Regimen is correct and unchanged.   Changes to medications: Miracle reports no changes at this time.  Changes to insurance: No  New side effects reported not previously addressed with a pharmacist or physician: None reported  Questions for the pharmacist: No    Confirmed patient received a Conservation officer, historic buildings and a Surveyor, mining with first shipment. The patient will receive a drug information handout for each medication shipped and additional FDA Medication Guides as required.       DISEASE/MEDICATION-SPECIFIC INFORMATION        For patients on injectable medications: Patient currently has 0 doses left.  Next injection is scheduled for 08/08/21.    SPECIALTY MEDICATION ADHERENCE     Medication Adherence    Patient reported X missed doses in the last month: 0  Specialty Medication: REPATHA  Patient is on additional specialty medications: No              Were doses missed due to medication being on hold? No    Repatha 140 mg/ml: 0 days of medicine on hand        REFERRAL TO PHARMACIST     Referral to the pharmacist: Not needed      [facility]     Shipping address confirmed in Epic.     Delivery Scheduled: Yes, Expected medication delivery date: 08/05/21.     Medication will be delivered via Same Day Courier to the prescription address in Epic WAM.    Unk Lightning   [facility] Specialty Technician

## 2021-07-29 NOTE — Unmapped (Signed)
1100 Patient in today for Ferrlicit infusion. Patient has no s/s of infection. No issues from the last infusion. #4 of 4  1143 Ferrlicit started ,patient instructed to use call bell /call nurse for any s/s of unsual symptoms during infusion. Patient educated on possible s/s of reaction,such as chestpain ,itching,shortness of breath lightheadedness and any kind of discomfort. Patient verbalized understanding.  1339 Infusion completed and well tolerated.  1345 Ferrlicit 250  mg/ 100 ml NS infused over 1hr and 56 min. per protocol . Pt alert and oriented, offered no complaints during infusion. IV flushed with 10ml NS post infusion. Pt tolerated infusion well.

## 2021-08-05 MED FILL — REPATHA SURECLICK 140 MG/ML SUBCUTANEOUS PEN INJECTOR: SUBCUTANEOUS | 28 days supply | Qty: 2 | Fill #9

## 2021-08-06 ENCOUNTER — Other Ambulatory Visit: Admit: 2021-08-06 | Discharge: 2021-08-07 | Payer: PRIVATE HEALTH INSURANCE

## 2021-08-06 DIAGNOSIS — R350 Frequency of micturition: Principal | ICD-10-CM

## 2021-08-06 LAB — URINALYSIS
GLUCOSE UA: NEGATIVE
KETONES UA: NEGATIVE
NITRITE UA: POSITIVE — AB
PH UA: 6 (ref 5.0–9.0)
PROTEIN UA: 30 — AB
RBC UA: 7 /HPF — ABNORMAL HIGH (ref <=4)
SPECIFIC GRAVITY UA: 1.02 (ref 1.003–1.030)
SQUAMOUS EPITHELIAL: 3 /HPF (ref 0–5)
UROBILINOGEN UA: 2 — AB
WBC UA: >182 /HPF — ABNORMAL HIGH (ref 0–5)

## 2021-08-06 NOTE — Unmapped (Signed)
Patient called complaining opf burning, frequent and painful urination. Per protocol orders entered

## 2021-08-07 DIAGNOSIS — N390 Urinary tract infection, site not specified: Principal | ICD-10-CM

## 2021-08-07 MED ORDER — NITROFURANTOIN MONOHYDRATE/MACROCRYSTALS 100 MG CAPSULE
ORAL_CAPSULE | Freq: Two times a day (BID) | ORAL | 0 refills | 7 days | Status: CP
Start: 2021-08-07 — End: 2021-08-14

## 2021-08-08 DIAGNOSIS — R350 Frequency of micturition: Principal | ICD-10-CM

## 2021-08-08 DIAGNOSIS — N390 Urinary tract infection, site not specified: Principal | ICD-10-CM

## 2021-08-08 MED ORDER — SULFAMETHOXAZOLE 800 MG-TRIMETHOPRIM 160 MG TABLET
ORAL_TABLET | Freq: Two times a day (BID) | ORAL | 0 refills | 7 days | Status: CP
Start: 2021-08-08 — End: 2021-08-15

## 2021-08-08 NOTE — Unmapped (Signed)
Patient voiced understanding and has already received her antibiotics.

## 2021-08-08 NOTE — Unmapped (Signed)
Patient notified

## 2021-08-27 DIAGNOSIS — I2510 Atherosclerotic heart disease of native coronary artery without angina pectoris: Principal | ICD-10-CM

## 2021-08-27 MED ORDER — REPATHA SURECLICK 140 MG/ML SUBCUTANEOUS PEN INJECTOR
SUBCUTANEOUS | 11 refills | 28 days
Start: 2021-08-27 — End: ?

## 2021-08-28 MED ORDER — REPATHA SURECLICK 140 MG/ML SUBCUTANEOUS PEN INJECTOR
SUBCUTANEOUS | 11 refills | 28 days | Status: CP
Start: 2021-08-28 — End: ?
  Filled 2021-09-03: qty 2, 28d supply, fill #0

## 2021-08-28 NOTE — Unmapped (Signed)
[facility] Refill Coordination Note    Specialty Medication(s) to be Shipped:   General Specialty: Repatha    Other medication(s) to be shipped: No additional medications requested for fill at this time     Amber Duncan, DOB: 11-Jun-1951  Phone: [phone] (home)       All above HIPAA information was verified with patient.     Was a Nurse, learning disability used for this call? No    Completed refill call assessment today to schedule patient's medication shipment from the [facility] [phone]).  All relevant notes have been reviewed.     Specialty medication(s) and dose(s) confirmed: Regimen is correct and unchanged.   Changes to medications: Amber Duncan reports no changes at this time.  Changes to insurance: No  New side effects reported not previously addressed with a pharmacist or physician: None reported  Questions for the pharmacist: No    Confirmed patient received a Conservation officer, historic buildings and a Surveyor, mining with first shipment. The patient will receive a drug information handout for each medication shipped and additional FDA Medication Guides as required.       DISEASE/MEDICATION-SPECIFIC INFORMATION        For patients on injectable medications: Patient currently has 0 doses left.  Next injection is scheduled for 09/05/21.    SPECIALTY MEDICATION ADHERENCE     Medication Adherence    Patient reported X missed doses in the last month: 0  Specialty Medication: Repatha 140mg /ml  Patient is on additional specialty medications: No  Patient is on more than two specialty medications: No              Were doses missed due to medication being on hold? No    Repatha 140 mg/ml: 0 days of medicine on hand       REFERRAL TO PHARMACIST     Referral to the pharmacist: Not needed      [facility]     Shipping address confirmed in Epic.     Delivery Scheduled: Yes, Expected medication delivery date: 09/03/21.  However, Rx request for refills was sent to the provider as there are none remaining. Medication will be delivered via Same Day Courier to the prescription address in Epic WAM.    Amber Duncan [facility] Specialty Technician

## 2021-09-16 ENCOUNTER — Ambulatory Visit
Admit: 2021-09-16 | Discharge: 2021-09-17 | Payer: PRIVATE HEALTH INSURANCE | Attending: Cardiovascular Disease | Primary: Cardiovascular Disease

## 2021-09-16 DIAGNOSIS — E119 Type 2 diabetes mellitus without complications: Principal | ICD-10-CM

## 2021-09-16 DIAGNOSIS — Z170 Estrogen receptor positive status [ER+]: Principal | ICD-10-CM

## 2021-09-16 DIAGNOSIS — N6310 Unspecified lump in the right breast, unspecified quadrant: Principal | ICD-10-CM

## 2021-09-16 DIAGNOSIS — I10 Essential (primary) hypertension: Principal | ICD-10-CM

## 2021-09-16 DIAGNOSIS — E782 Mixed hyperlipidemia: Principal | ICD-10-CM

## 2021-09-16 DIAGNOSIS — I2510 Atherosclerotic heart disease of native coronary artery without angina pectoris: Principal | ICD-10-CM

## 2021-09-16 DIAGNOSIS — C50312 Malignant neoplasm of lower-inner quadrant of left female breast: Principal | ICD-10-CM

## 2021-09-16 NOTE — Unmapped (Signed)
Very nice to see you today.    I have reached out to your oncology team to let them know about the breast lump. Hopefully they will contact you directly, but I will let you know once I hear back from them.    As we discussed, please check your blood pressure a few times per week and report the results to me in a few weeks. The target systolic blood pressure (upper number) is less than 130.    See you again in 1 year. Please reach out with any concerns that arise prior to then.

## 2021-09-16 NOTE — Unmapped (Signed)
[facility] Return Patient/Consult Note    Referring Provider:    Primary Provider: [name], MD   [address] [facility]  [place] [address]       Reason for Visit:  Amber Duncan is a 71 y.o. female who returns for ongoing management of CAD in the setting of treatment for breast cancer.    Assessment & Plan:  1. Atherosclerosis of native coronary artery of native heart without angina pectoris  She has undergone 2 PCI's to her RCA, most recently in the setting of stable angina in November 2020. At present she is asymptomatic. She completed 6 months of DAPT, adequate for zotarolimus-eluting stents such as hers, particularly given that she has symptomatic anemia due to GI bleed. She developed myalgias to multiple statins in the past, and became markedly hyperglycemic on rosuvastatin. She started Repatha in 2021, and denies any issues administering the injection - recent LDL 34 (08/05/20), down from 184 (04/09/20). Recently began taking 5 mg pravastatin (half of 10mg  tab), and denies any muscle pain. Her LDL 67 on 07/15/21 due to missing Repatha for several months - therapy has now been resumed  --continue ASA 81 mg daily indefinitely despite continued low volume blood loss from AVMs  --continue Repatha and low dose pravastatin    2. Essential hypertension  Longstanding with questionsable control on current regimen. She will check her BPs regularly at home and report the results to me in 2 weeks.  --continue lisinopril 20 mg daily    3. Malignant neoplasm of female breast, unspecified estrogen receptor status, unspecified laterality, unspecified site of breast (CMS-HCC)  Details of diagnosis and evaluation can be found below. Ongoing evaluation and management per Dr. Dellis Duncan and the Oncology team. Given her report of a breast lump on self-exam, I reached out to her breast oncology team.    4. Iron deficiency, likely etiology AVMs  Receiving chronic IV iron intermittently for several years, followed by Hematology.    RTC in 6 months    History of Present Illness:  Amber Duncan who returns for ongoing management of CAD in the setting of treatment for breast cancer. Amber Duncan was diagnosed with CAD in 2008 and underwent PCI at that time. She had no further cardiac issues until Fall 2020 when she developed exertional chest tightness that radiated down her left arm. She underwent a myocardial perfusion scan followed by a left heart catheterization, during which she received a PCI to the RCA (07/31/19). Thereafter her exertional symptoms resolved and have not recurred.     Today, Amber Duncan reports that she continues to experience fatigue which is somewhat improved with IV and oral iron. An appointment with GI is pending. Her primary concern today is a new lump in her right breast, first noted about three weeks ago. She also reports a sharp pain in her right breast that has been ongoing for the past 3 months, occurs sporadically and infrequently (1-2x per month) and resolves spontaneously. She emphasized that the pain is 8/10. She denies left sided chest pain. The chest tightness and SOB she experienced in the past with household cleaning and other activities has resolved although she continues to have sporadic episodes of myoclonic dystonia. She denies palpitations, presyncope and syncope although she reports occasional, mild orthostasis. She believes her home BPs are < 130/70 but she is unsure. She is very confident that her home HR is 95-100 which is consistent with past visits. She denies orthopnea (baseline  head of bed elevated 20-30%) and PND but she endorses chronic abdominal fullness primarily associated with dystonia. This edema occurs most days and usually at night, and some days she will wake up with swelling. She is no longer using CPAP as the machine broke about 3 months ago. In addition, she missed several months of her Repatha resulting in a slight elevation of her LDL however she resumed it in November.       Cardiovascular History & Procedures:  Cath / PCI:  07/31/19  ?? Mid Cx to Dist Cx lesion is 50% stenosed.   ?? Prox RCA lesion is 40% stenosed.   ?? Dist RCA lesion is 90% stenosed.   ?? Previously placed Mid RCA to Dist RCA stent (unknown type) is widely  patent.   ?? A drug-eluting stent was successfully placed using a STENT RESOLUTE ONYX  [id number].   ?? Post intervention, there is a 0% residual stenosis.     1. ??One-vessel CAD with patent stent mid to distal RCA with 90% stenosis   in distal native vessel just beyond the stent   2. ??Normal left ventricular function   3. ??Successful PCI with DES distal RCA    PCI to RCA 2008    CV Surgery:  None  EP Procedures and Devices:  None    Non-Invasive Evaluation(s):  Echo:  07/20/19  NORMAL LEFT VENTRICULAR SYSTOLIC FUNCTION ?? WITH MILD LVH   NORMAL RIGHT VENTRICULAR SYSTOLIC FUNCTION   MILD VALVULAR REGURGITATION (See above)   NO VALVULAR STENOSIS   TRIVIAL MR, TR   MILD PR   EF 50-55%     CT/MRI/Nuclear Tests:  Myocardial perfusion scan 07/20/19  1. ??Negative ETT with nonspecific ST abnormalities   2. ??Normal left ventricular function   3. ??Normal wall motion   4. ??Minimal to mild apical wall ischemia     Past Medical History:   Diagnosis Date   ??? Breast cancer (CMS-HCC)    ??? Chronic UTI    ??? COPD (chronic obstructive pulmonary disease) (CMS-HCC)    ??? Coronary artery disease    ??? Diabetes (CMS-HCC)    ??? Disease of thyroid gland    ??? Fatigue    ??? Hepatitis    ??? Hepatitis    ??? Hyperlipidemia    ??? Hypertension    ??? Migraine    ??? Sleep apnea       Oncology History Overview Note   2021: Left breast pT1b N0 IDC, G1, +/+/-     Malignant neoplasm of lower-inner quadrant of left breast in female, estrogen receptor positive (CMS-HCC)   10/24/2019 -  Presenting Symptoms    Abnormal screening MMG: asymmetry in the left breast     11/07/2019 Interval Scan(s)    MMG with tomo: Left breast LOQ small mass with associated distortion.  [place]: Left breast 7 x 4 x 6 mm irregular hypoechoic mass at the 5 o???clock 4 CFN position.     11/15/2019 Biopsy    Left breast stereotactic biopsy 5:00, 4CFN: IDC, G1, ER 95% positive, PR 80% positive. HER2- (1+) and associated DCIS.     12/26/2019 Initial Diagnosis    Malignant neoplasm of lower-inner quadrant of left breast in female, estrogen receptor positive (CMS-HCC)     12/27/2019 Tumor Board    MDC recs: Left breast cT1b N0 IDC, G1, +/+/-. 7 mm on imaging review, excellent candidate for BCT. Candidate to omit axillary staging. Possible candidate to omit radiation. Will see med/onc post op.  12/27/2019 -  Cancer Staged    Staging form: Breast, AJCC 8th Edition  - Clinical stage from 12/27/2019: Stage IA (cT1b, cN0, cM0, G1, ER+, PR+, HER2-) - Signed by Talbert Cage, DO on 12/27/2019       01/19/2020 Surgery    Left US-guided partial mastectomy performed by Dr. Dellis Duncan. Final pathology showed IDC 7mm in greatest dimension with DCIS grade 2, all margins clear     01/29/2020 -  Cancer Staged    Staging form: Breast, AJCC 8th Edition  - Pathologic stage from 01/29/2020: Stage IA (pT1b, pN0, cM0, G1, ER+, PR+, HER2-) - Signed by Talbert Cage, DO on 02/06/2020           Past Surgical History:   Procedure Laterality Date   ??? BACK SURGERY     ??? BREAST BIOPSY     ??? BREAST LUMPECTOMY Left 01/2020   ??? CATARACT EXTRACTION     ??? CHOLECYSTECTOMY     ??? CORONARY STENT PLACEMENT     ??? HYSTERECTOMY     ??? KNEE SURGERY     ??? PR COLONOSCOPY,ABLATE LESION N/A 01/21/2017    Procedure: COLONOSCOPY FLEX; W/ABLAT LES NOT AMENABLE-SNARE;  Surgeon: Luanne Bras, MD;  Location: [facility];  Service: Gastroenterology   ??? PR COLSC FLX W/RMVL OF TUMOR POLYP LESION SNARE TQ N/A 01/21/2017    Procedure: COLONOSCOPY FLEX; W/REMOV TUMOR/LES BY SNARE;  Surgeon: Luanne Bras, MD;  Location: [facility];  Service: Gastroenterology   ??? PR MASTECTOMY, PARTIAL Left 01/19/2020    Procedure: MASTECTOMY, PARTIAL (EG, LUMPECTOMY, TYLECTOMY, QUADRANTECTOMY, SEGMENTECTOMY);  Surgeon: Talbert Cage, DO;  Location: MAIN OR [facility];  Service: Surgical Oncology Breast   ??? PR UPPER GI ENDOSCOPY,BIOPSY N/A 04/29/2018    Procedure: UGI ENDOSCOPY; WITH BIOPSY, SINGLE OR MULTIPLE;  Surgeon: Rona Ravens, MD;  Location[facility];  Service: Gastroenterology   ??? RADIATION Left    ??? REDUCTION MAMMAPLASTY     ??? REPLACEMENT TOTAL KNEE Right          Allergies:  Patient has no known allergies.    Current Medications:  Current Outpatient Medications   Medication Sig Dispense Refill   ??? aspirin-calcium carbonate 81 mg-300 mg calcium(777 mg) Tab Take 81 mg by mouth.     ??? blood sugar diagnostic (ONETOUCH ULTRA TEST) Strp Test blood sugars once daily or as directed 100 strip 3   ??? clonazePAM (KLONOPIN) 0.5 MG tablet Please take 1.5 tabs twice daily as we discussed. 90 tablet 3   ??? d-mannose 500 mg cap Take 1,500 mg by mouth daily. 90 capsule 11   ??? docusate sodium (COLACE) 100 MG capsule Take 100 mg by mouth daily.     ??? empty container (SHARPS-A-GATOR DISPOSAL SYSTEM) Misc Use as directed for sharps disposal 1 each 2   ??? estradioL (ESTRACE) 0.01 % (0.1 mg/gram) vaginal cream Insert 2 g into the vagina Two (2) times a week. 16 g 11   ??? evolocumab (REPATHA SURECLICK) 140 mg/mL PnIj Inject the contents of 1 pen (140 mg) under the skin every fourteen (14) days. 2 mL 11   ??? ferrous gluconate 324 MG tablet Take 324 mg by mouth once.     ??? FLUoxetine (PROZAC) 10 MG tablet Take 1 tablet (10 mg total) by mouth daily. 90 tablet 3   ??? levETIRAcetam (KEPPRA) 1000 MG tablet Take 1 tablet (1,000 mg total) by mouth Two (2) times a day.  180 tablet 3   ??? lisinopriL (PRINIVIL,ZESTRIL) 20 MG tablet Take 1 tablet (20 mg total) by mouth daily. 90 tablet 3   ??? metFORMIN (GLUCOPHAGE) 500 MG tablet Take 2 tablets (1,000 mg total) by mouth in the morning and 2 tablets (1,000 mg total) in the evening. Take with meals. 360 tablet 1   ??? omeprazole 20 mg TbLD Take by mouth nightly as needed.     ??? pravastatin (PRAVACHOL) 10 MG tablet Take 0.5 tablets (5 mg total) by mouth daily. 45 tablet 3   ??? SUMAtriptan (IMITREX) 100 MG tablet Take one tab at onset of headache, may repeat in 2 hours if headache persists of recurs 9 tablet 5   ??? SUMAtriptan (IMITREX) 100 MG tablet Take 1 tablet (100 mg total) by mouth once as needed for migraine. 12 tablet 3   ??? SYNTHROID 100 mcg tablet Take 1 tablet (100 mcg total) by mouth daily. 90 tablet 3     No current facility-administered medications for this visit.       Family History:  There is no family history of premature coronary artery disease or sudden cardiac death.   Family History   Problem Relation Age of Onset   ??? Cancer Brother         liver   ??? Cancer Maternal Aunt    ??? Cancer Maternal Uncle    ??? Diabetes Paternal Uncle    ??? Other Mother         MVA   ??? Other Father         MVA   ??? No Known Problems Sister    ??? No Known Problems Son    ??? Cancer Brother         Lung   ??? Other Sister         MVA   ??? Other Sister         MVA drunk driver   ??? No Known Problems Brother    ??? No Known Problems Daughter    ??? No Known Problems Maternal Grandmother    ??? No Known Problems Maternal Grandfather    ??? No Known Problems Paternal Grandmother    ??? No Known Problems Paternal Grandfather    ??? No Known Problems Other    ??? Heart failure Neg Hx    ??? BRCA 1/2 Neg Hx    ??? Breast cancer Neg Hx    ??? Colon cancer Neg Hx    ??? Endometrial cancer Neg Hx    ??? 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 She quit smoking roughly 8 years ago.  Social History     Tobacco Use   ??? Smoking status: Former     Packs/day: 2.00     Years: 48.00     Pack years: 96.00     Types: Cigarettes     Quit date: 11/06/2011     Years since quitting: 9.8   ??? Smokeless tobacco: Never   Vaping Use   ??? Vaping Use: Never used   Substance Use Topics   ??? Alcohol use: No   ??? Drug use: No       Review of Systems:  A full review of 10 systems is unremarkable except as stated in the HPI.    Physical Exam:  VITAL SIGNS:   Vitals:    09/16/21 0758   BP: 157/81   Pulse: 54  SpO2: 99%   Recheck of BP 143/86 and HR 67.    Wt Readings from Last 3 Encounters:   07/25/21 83.7 kg (184 lb 9.6 oz)   07/15/21 82.7 kg (182 lb 4.8 oz)   07/11/21 83.5 kg (184 lb)      Today's There is no height or weight on file to calculate BMI.   I performed a physical exam (09/16/21). It is documented accurately below.  GENERAL: well-appearing in no acute distress  HEENT: Normocephalic and atraumatic with anicteric sclerae    NECK: Supple, without lymphadenopathy or thyromegaly. JVP flat. There are no carotid bruits  CARDIOVASCULAR: Regular S1S2 without audible murmur or gallop  RESPIRATORY: Clear to auscultation without wheezes or rales.   ABDOMEN: Soft, non-tender, non-distended with audible bowel sounds. There is no organomegaly or palpable pulsatile mass.   EXTREMITIES:  Lower extremities are warm and without edema.   SKIN: No rashes, ecchymosis or petechiae.  NEURO: Alert, pleasant, and appropriate. Non-focal neuro exam    Pertinent Laboratory Studies:   Lab Results   Component Value Date    Creatinine Whole Blood, POC 1.2 (H) 03/12/2021    Creatinine Whole Blood, POC 0.8 07/10/2016    Creatinine 1.00 (H) 12/31/2020    Creatinine 0.99 (H) 04/23/2020    BUN 19 12/31/2020    BUN 20 04/23/2020    Potassium 4.2 12/31/2020    Potassium 5.1 (H) 04/23/2020    AST 31 12/31/2020    AST 16 04/09/2020    ALT 22 12/31/2020    ALT 7 (L) 04/09/2020    TSH 2.108 01/13/2021    Total Bilirubin 0.4 12/31/2020    Total Bilirubin 0.2 (L) 04/09/2020    INR 1.03 05/26/2018    INR 1.18 01/20/2017    WBC 5.4 06/09/2021    HGB 11.8 06/09/2021    Hemoglobin 12.1 10/11/2020    HCT 35.1 06/09/2021    Platelet 243 06/09/2021    Triglycerides 269 (H) 07/15/2021    HDL 48 07/15/2021    Non-HDL Cholesterol 121 07/15/2021    LDL Calculated 67 07/15/2021       Other pertinent records were reviewed.    Pertinent Test Results from Today:  None    I personally spent 35 minutes face-to-face and non-face-to-face in the care of this patient, which includes all pre, intra, and post visit time on the date of service.

## 2021-09-19 ENCOUNTER — Ambulatory Visit: Admit: 2021-09-19 | Discharge: 2021-09-20 | Payer: PRIVATE HEALTH INSURANCE

## 2021-09-19 ENCOUNTER — Ambulatory Visit
Admit: 2021-09-19 | Discharge: 2021-09-20 | Payer: PRIVATE HEALTH INSURANCE | Attending: Adult Health | Primary: Adult Health

## 2021-09-19 DIAGNOSIS — C50312 Malignant neoplasm of lower-inner quadrant of left female breast: Principal | ICD-10-CM

## 2021-09-19 DIAGNOSIS — Z170 Estrogen receptor positive status [ER+]: Principal | ICD-10-CM

## 2021-09-19 DIAGNOSIS — N6310 Unspecified lump in the right breast, unspecified quadrant: Principal | ICD-10-CM

## 2021-09-19 NOTE — Unmapped (Signed)
It was a pleasure to see you today in the Breast Surgical Oncology Clinic. Please call my nurse navigator, Alinda Dooms, if you have any interval questions or concerns.     Mammogram today.  If normal, I will see you again in one year with repeat mammogram  I will cancel your mammogram in May.  Please keep your radiation oncology appointment as scheduled.       Surgical oncology contact information:  For appointments, please call [phone].     Nurse Navigator available Monday through Friday 8:30 am to 4:30 pm:   Alinda Dooms, RN:    Phone: [phone]   Fax: [phone] attention Alinda Dooms, RN    For emergencies, evenings or weekends, please call [phone] and ask for the surgical oncology resident on call. Please be aware that this person is also responding to in-hospital emergencies and patient issues and may not answer your phone call immediately, but will return your call as soon as possible.

## 2021-09-23 NOTE — Unmapped (Signed)
Called pt to schedule her 5/24 appt, she said that she didn't think that she would need the appt since she had a mammo and visit w/ Harrell last week. I told her that I would check w/ Dr Carles Collet and see what he would advise and then follow up w/ her if needed

## 2021-09-24 NOTE — Unmapped (Signed)
Spoke w/ pt and scheduled her July appt w/ Dr. Carles Collet, pt approved date and time.

## 2021-09-25 NOTE — Unmapped (Signed)
[facility] Refill Coordination Note    Specialty Medication(s) to be Shipped:   General Specialty: Repatha    Other medication(s) to be shipped: No additional medications requested for fill at this time     Amber Duncan, DOB: 1951-07-02  Phone: [phone] (home)       All above HIPAA information was verified with patient.     Was a Nurse, learning disability used for this call? No    Completed refill call assessment today to schedule patient's medication shipment from the [facility] [phone]).  All relevant notes have been reviewed.     Specialty medication(s) and dose(s) confirmed: Regimen is correct and unchanged.   Changes to medications: [name] reports no changes at this time.  Changes to insurance: No  New side effects reported not previously addressed with a pharmacist or physician: None reported  Questions for the pharmacist: No    Confirmed patient received a Conservation officer, historic buildings and a Surveyor, mining with first shipment. The patient will receive a drug information handout for each medication shipped and additional FDA Medication Guides as required.       DISEASE/MEDICATION-SPECIFIC INFORMATION        For patients on injectable medications: Patient currently has 0 doses left.  Next injection is scheduled for 10/03/21.    SPECIALTY MEDICATION ADHERENCE     Medication Adherence    Patient reported X missed doses in the last month: 0  Specialty Medication: Repatha 140mg /ml  Patient is on additional specialty medications: No  Patient is on more than two specialty medications: No              Were doses missed due to medication being on hold? No    Repatha 140 mg/ml: 0 days of medicine on hand     REFERRAL TO PHARMACIST     Referral to the pharmacist: Not needed      [facility]     Shipping address confirmed in Epic.     Delivery Scheduled: Yes, Expected medication delivery date: 10/01/21.     Medication will be delivered via Same Day Courier to the prescription address in Epic WAM.    Nancy Nordmann [facility] Specialty Technician

## 2021-09-25 NOTE — Unmapped (Signed)
Patient Name: CAMILA MAITA  Patient Age: 71 y.o.  Encounter Date: 09/19/2021    Referring Physician:   Carrington Clamp, MD  [address]  [id number]  Dept of Medicine  [place],  [place] [address]    Primary Care Provider:  [name], MD    Breast Cancer Treatment Team:  Surgical Oncology: Sherilyn Cooter, DO  Surgical Oncology NP: Pollyann Samples, NP  Radiation Oncology: Rayetta Humphrey, MD  Medical Oncology: Royal Hawthorn, MD (has also been followed by Rickard Patience, MD at [facility])    Reason for Visit: Acute visit, right breast mass and pain    HPI:  Amber Duncan is a 71 y.o. female who presents for followup after breast cancer surgery. Below is a synopsis of past and present health information pertinent to her breast cancer diagnosis, treatment and current health status. She is unaccompanied today. Olivette was last seen in clinic on 07/25/2021. She reports that on the night before Christmas she had an acute pain in her right breast. Something told her to palpate the area of pain and she was able to feel a mass for which she presents today.  She notes some overlying skin discoloration.  She continues to get twinges of pain, but not the same as what she experienced when she felt the mass.  As she did not recall it being there previously, she sought additional work-up.  She otherwise denies associated symptoms including redness, fever, chills, breast retraction, nipple inversion, breast swelling or nipple discharge. She notes ongoing bilateral hip pain and pain in her left upper arm at the humerus that was present prior to her cancer diagnosis that is unchanged. She is having trouble with iron deficiency anemia and has been receiving iron infusions.  She denies any BRBPR, but does not black and tarry stools.  She does take oral iron.  She has a history of GI bleed and her been referred to GI but has not been called for an appointment. She was told that they first have to confirm she is not seeing a GI physician elsewhere, which she denies at present. No lymphedema or difficulty with ROM.  No other specific complaints today.   No other changes to her medical, surgical, family or social history.     Historically, Jilliam presented with an abnormal screening mammogram    Diagnosis:    1. Left breast infiltrating ductal carcinoma   Cancer Staging   Malignant neoplasm of lower-inner quadrant of left breast in female, estrogen receptor positive (CMS-HCC)  Staging form: Breast, AJCC 8th Edition  - Clinical stage from 12/27/2019: Stage IA (cT1b, cN0, cM0, G1, ER+, PR+, HER2-) - Signed by Talbert Cage, DO on 12/27/2019  - Pathologic stage from 01/29/2020: Stage IA (pT1b, pN0, cM0, G1, ER+, PR+, HER2-) - Signed by Talbert Cage, DO on 02/06/2020      Procedure:   1. Left US guided partial mastectomy, performed on 01/19/2020.  SLNBx omitted per [facility] criteria.    Clinical Trial Participant:   1. No clinical trials at this time.    Medical Oncology: declined by patient with Royal Hawthorn, MD    Radiation Oncology: whole breast irradiation with Rayetta Humphrey, MD    Review of Systems:  10 organ systems reviewed and pertinent as noted in HPI.       Medical history reviewed as below. I have also reviewed additional records as provided.  Oncology History Overview Note   2021: Left breast pT1b N0 IDC,  G1, +/+/-     Malignant neoplasm of lower-inner quadrant of left breast in female, estrogen receptor positive (CMS-HCC)   10/24/2019 -  Presenting Symptoms    Abnormal screening MMG: asymmetry in the left breast     11/07/2019 Interval Scan(s)    MMG with tomo: Left breast LOQ small mass with associated distortion.  [place]: Left breast 7 x 4 x 6 mm irregular hypoechoic mass at the 5 o???clock 4 CFN position.     11/15/2019 Biopsy    Left breast stereotactic biopsy 5:00, 4CFN: IDC, G1, ER 95% positive, PR 80% positive. HER2- (1+) and associated DCIS.     12/26/2019 Initial Diagnosis    Malignant neoplasm of lower-inner quadrant of left breast in female, estrogen receptor positive (CMS-HCC)     12/27/2019 Tumor Board    MDC recs: Left breast cT1b N0 IDC, G1, +/+/-. 7 mm on imaging review, excellent candidate for BCT. Candidate to omit axillary staging. Possible candidate to omit radiation. Will see med/onc post op.      12/27/2019 -  Cancer Staged    Staging form: Breast, AJCC 8th Edition  - Clinical stage from 12/27/2019: Stage IA (cT1b, cN0, cM0, G1, ER+, PR+, HER2-) - Signed by Talbert Cage, DO on 12/27/2019       01/19/2020 Surgery    Left US-guided partial mastectomy performed by Dr. Dellis Anes. Final pathology showed IDC 7mm in greatest dimension with DCIS grade 2, all margins clear     01/29/2020 -  Cancer Staged    Staging form: Breast, AJCC 8th Edition  - Pathologic stage from 01/29/2020: Stage IA (pT1b, pN0, cM0, G1, ER+, PR+, HER2-) - Signed by Talbert Cage, DO on 02/06/2020         Past Medical History:   Diagnosis Date   ??? Breast cancer (CMS-HCC)    ??? Chronic UTI    ??? COPD (chronic obstructive pulmonary disease) (CMS-HCC)    ??? Coronary artery disease    ??? Diabetes (CMS-HCC)    ??? Disease of thyroid gland    ??? Fatigue    ??? Hepatitis    ??? Hepatitis    ??? Hyperlipidemia    ??? Hypertension    ??? Migraine    ??? Sleep apnea      Past Surgical History:   Procedure Laterality Date   ??? BACK SURGERY     ??? BREAST BIOPSY     ??? BREAST LUMPECTOMY Left 01/2020   ??? CATARACT EXTRACTION     ??? CHOLECYSTECTOMY     ??? CORONARY STENT PLACEMENT     ??? HYSTERECTOMY     ??? KNEE SURGERY     ??? PR COLONOSCOPY,ABLATE LESION N/A 01/21/2017    Procedure: COLONOSCOPY FLEX; W/ABLAT LES NOT AMENABLE-SNARE;  Surgeon: Luanne Bras, MD;  [facility];  Service: Gastroenterology   ??? PR COLSC FLX W/RMVL OF TUMOR POLYP LESION SNARE TQ N/A 01/21/2017    Procedure: COLONOSCOPY FLEX; W/REMOV TUMOR/LES BY SNARE;  Surgeon: Luanne Bras, MD;  Location: [facility];  Service: Gastroenterology   ??? PR MASTECTOMY, PARTIAL Left 01/19/2020    Procedure: MASTECTOMY, PARTIAL (EG, LUMPECTOMY, TYLECTOMY, QUADRANTECTOMY, SEGMENTECTOMY);  Surgeon: Talbert Cage, DO;  Location: MAIN OR [facility];  Service: Surgical Oncology Breast   ??? PR UPPER GI ENDOSCOPY,BIOPSY N/A 04/29/2018    Procedure: UGI ENDOSCOPY; WITH BIOPSY, SINGLE OR MULTIPLE;  Surgeon: Rona Ravens, MD;  [facility];  Service: Gastroenterology   ???  RADIATION Left    ??? REDUCTION MAMMAPLASTY     ??? REPLACEMENT TOTAL KNEE Right      Family History   Problem Relation Age of Onset   ??? Cancer Brother         liver   ??? Cancer Maternal Aunt    ??? Cancer Maternal Uncle    ??? Diabetes Paternal Uncle    ??? Other Mother         MVA   ??? Other Father         MVA   ??? No Known Problems Sister    ??? No Known Problems Son    ??? Cancer Brother         Lung   ??? Other Sister         MVA   ??? Other Sister         MVA drunk driver   ??? No Known Problems Brother    ??? No Known Problems Daughter    ??? No Known Problems Maternal Grandmother    ??? No Known Problems Maternal Grandfather    ??? No Known Problems Paternal Grandmother    ??? No Known Problems Paternal Grandfather    ??? No Known Problems Other    ??? Heart failure Neg Hx    ??? BRCA 1/2 Neg Hx    ??? Breast cancer Neg Hx    ??? Colon cancer Neg Hx    ??? Endometrial cancer Neg Hx    ??? Ovarian cancer Neg Hx      Social History     Socioeconomic History   ??? Marital status: Divorced   Tobacco Use   ??? Smoking status: Former     Packs/day: 2.00     Years: 48.00     Pack years: 96.00     Types: Cigarettes     Quit date: 11/06/2011     Years since quitting: 9.8   ??? Smokeless tobacco: Never   Vaping Use   ??? Vaping Use: Never used   Substance and Sexual Activity   ??? Alcohol use: No   ??? Drug use: No   Social History Narrative    She lives in [place]. 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 She denies alcohol and illicit drug use.       Social Determinants of Health     Financial Resource Strain: Medium Risk   ??? Difficulty of Paying Living Expenses: Somewhat hard   Food Insecurity: Food Insecurity Present   ??? Worried About Programme researcher, broadcasting/film/video in the Last Year: Sometimes true   ??? Ran Out of Food in the Last Year: Never true   Transportation Needs: No Transportation Needs   ??? Lack of Transportation (Medical): No   ??? Lack of Transportation (Non-Medical): No       Current Outpatient Medications:   ???  aspirin-calcium carbonate 81 mg-300 mg calcium(777 mg) Tab, Take 81 mg by mouth., Disp: , Rfl:   ???  blood sugar diagnostic (ONETOUCH ULTRA TEST) Strp, Test blood sugars once daily or as directed, Disp: 100 strip, Rfl: 3  ???  clonazePAM (KLONOPIN) 0.5 MG tablet, Please take 1.5 tabs twice daily as we discussed., Disp: 90 tablet, Rfl: 3  ???  d-mannose 500 mg cap, Take 1,500 mg by mouth daily., Disp: 90 capsule, Rfl: 11  ???  docusate sodium (COLACE) 100 MG capsule, Take 100 mg by mouth daily as needed., Disp: , Rfl:   ???  empty container (SHARPS-A-GATOR DISPOSAL SYSTEM) Misc, Use as directed for sharps disposal, Disp: 1 each, Rfl: 2  ???  estradioL (ESTRACE) 0.01 % (0.1 mg/gram) vaginal cream, Insert 2 g into the vagina Two (2) times a week., Disp: 16 g, Rfl: 11  ???  evolocumab (REPATHA SURECLICK) 140 mg/mL PnIj, Inject the contents of 1 pen (140 mg) under the skin every fourteen (14) days., Disp: 2 mL, Rfl: 11  ???  ferrous gluconate 324 MG tablet, Take 324 mg by mouth once., Disp: , Rfl:   ???  FLUoxetine (PROZAC) 10 MG tablet, Take 1 tablet (10 mg total) by mouth daily., Disp: 90 tablet, Rfl: 3  ???  levETIRAcetam (KEPPRA) 1000 MG tablet, Take 1 tablet (1,000 mg total) by mouth Two (2) times a day., Disp: 180 tablet, Rfl: 3  ???  lisinopriL (PRINIVIL,ZESTRIL) 20 MG tablet, Take 1 tablet (20 mg total) by mouth daily., Disp: 90 tablet, Rfl: 3  ???  metFORMIN (GLUCOPHAGE) 500 MG tablet, Take 2 tablets (1,000 mg total) by mouth in the morning and 2 tablets (1,000 mg total) in the evening. Take with meals., Disp: 360 tablet, Rfl: 1  ???  omeprazole 20 mg TbLD, Take by mouth nightly as needed., Disp: , Rfl:   ???  pravastatin (PRAVACHOL) 10 MG tablet, Take 0.5 tablets (5 mg total) by mouth daily., Disp: 45 tablet, Rfl: 3  ???  SUMAtriptan (IMITREX) 100 MG tablet, Take one tab at onset of headache, may repeat in 2 hours if headache persists of recurs, Disp: 9 tablet, Rfl: 5  ???  SUMAtriptan (IMITREX) 100 MG tablet, Take 1 tablet (100 mg total) by mouth once as needed for migraine., Disp: 12 tablet, Rfl: 3  ???  SYNTHROID 100 mcg tablet, Take 1 tablet (100 mcg total) by mouth daily., Disp: 90 tablet, Rfl: 3  No Known Allergies      Physical Examination:   Blood pressure 147/103, pulse 68, temperature 36.5 ??C (97.7 ??F), temperature source Temporal, resp. rate 16, weight 85.2 kg (187 lb 12.8 oz), SpO2 97 %.  General Appearance:  No acute distress, well appearing and well nourished.   Head:  Normocephalic, atraumatic.   Eyes:  Conjuctiva and lids appear normal. Pupils equal and round,   sclera anicteric.   Ears:  Overall appearance normal with no scars, lesions or               masses.  Hearing is grossly normal.   Neck: Supple, symmetrical, trachea midline, no adenopathy;   thyroid: no enlargement/tenderness/nodules.   Pulmonary:    Normal respiratory effort.    Musculoskeletal: Normal gait. Extremities without clubbing, cyanosis, or           edema.   Skin: Skin color, texture, turgor normal, no rashes or lesions.   Neurologic: No motor abnormalities noted. Sensation grossly intact.   Lymphatic:  Breast: No cervical or supraclavicular LAD noted.   A comprehensive examination of the breasts and chest wall was performed with the patient upright and supine with arms at her sides and above her head.   Left breast normal in size, normal symmetry, normal contour, no dimpling, nipple everted, nipple exhibits no crusting or excoriation, no masses or nodules, no palpable axillary adenopathy. Skin is slightly hyperpigmented from prior radiation. The breast is tender to palpation. infraclavicular and supraclavicular nodal examinations are negative and arm circumference appears symmetric without evidence of lymphedema. Cosmetic outcome is excellent.  Right breast at the 2 o'clock position at the border of the  NAC is a prominent vessel with underlying 3 mm dense tissue. normal in size, normal symmetry, normal contour, no dimpling, no skin changes, nipple everted, nipple exhibits no crusting or excoriation, no masses or nodules, no palpable axillary adenopathy. No point tenderness on exam.    Psychiatric: Judgement and insight appropriate. Oriented to person,         place, and time.       Imaging Review:  Referred for bialteral mammogram (was due bilateral in May) and right ultrasound      I have personally reviewed the breast imaging, laboratory values, existing medical records, and pathology. I have summarized these findings and my interpretation in the oncology history and assessment above.    Impression:  Ms. Blackston is a 71 y.o. female with a history of left breast infiltrating ductal carcinoma, estrogen receptor positive, progesterone receptor positive and HER2 negative. See treatment summary above.   Staging   Cancer Staging   Malignant neoplasm of lower-inner quadrant of left breast in female, estrogen receptor positive (CMS-HCC)  Staging form: Breast, AJCC 8th Edition  - Clinical stage from 12/27/2019: Stage IA (cT1b, cN0, cM0, G1, ER+, PR+, HER2-) - Signed by Talbert Cage, DO on 12/27/2019  - Pathologic stage from 01/29/2020: Stage IA (pT1b, pN0, cM0, G1, ER+, PR+, HER2-) - Signed by Talbert Cage, DO on 02/06/2020    There is no evidence of disease at 1.5 year.    Recommendations:  1. Patient previously declined endocrine therapy and is comfortable with this decision.   2. Mammogram and ultrasound today.  Her symptoms maybe consistent with a phlebitis the density underlying the prominent vessel does not clinically feel consistent with a malignant mass, but given her history and this is a new finding, work-up is prudent. I will follow-up with her regarding these results.   3. Pending today's results, will have her follow-up in year with mammogram.   4. Discussed options for management of her breast pain.  As it is not constant, she declines therapy.  5. The patient is scheduled to see GI in July of this year.       All of the patient's questions and concerns were addressed during the visit and she is in agreement with the plan of care. Please do not hesitate to contact me with questions or concerns.     Cancer Staging   Malignant neoplasm of lower-inner quadrant of left breast in female, estrogen receptor positive (CMS-HCC)  Staging form: Breast, AJCC 8th Edition  - Clinical stage from 12/27/2019: Stage IA (cT1b, cN0, cM0, G1, ER+, PR+, HER2-) - Signed by Talbert Cage, DO on 12/27/2019  - Pathologic stage from 01/29/2020: Stage IA (pT1b, pN0, cM0, G1, ER+, PR+, HER2-) - Signed by Talbert Cage, DO on 02/06/2020      Pollyann Samples, NP-C  Surgical Breast Oncology  09/25/2021  9:48 AM

## 2021-09-26 NOTE — Unmapped (Signed)
Followed up with pt that mammogram was BIRADs 2.  No concerning findings at the area of palpable concern.  She will keep rad/onc appt as scheduled for exam only (will not need mammogram).  She will then follow-up with me in one year with mammo.  Message sent to schedulers.  Pt understands to call for a sooner appointment with any new breast complaints.  She agreed

## 2021-10-01 MED FILL — REPATHA SURECLICK 140 MG/ML SUBCUTANEOUS PEN INJECTOR: SUBCUTANEOUS | 28 days supply | Qty: 2 | Fill #1

## 2021-10-09 MED ORDER — CLONAZEPAM 0.5 MG TABLET
ORAL_TABLET | 0 refills | 0 days
Start: 2021-10-09 — End: ?

## 2021-10-13 ENCOUNTER — Ambulatory Visit
Admit: 2021-10-13 | Discharge: 2021-10-13 | Payer: PRIVATE HEALTH INSURANCE | Attending: Nurse Practitioner | Primary: Nurse Practitioner

## 2021-10-13 DIAGNOSIS — D500 Iron deficiency anemia secondary to blood loss (chronic): Principal | ICD-10-CM

## 2021-10-13 DIAGNOSIS — I2510 Atherosclerotic heart disease of native coronary artery without angina pectoris: Principal | ICD-10-CM

## 2021-10-13 LAB — CBC W/ AUTO DIFF
BASOPHILS ABSOLUTE COUNT: 0.1 10*9/L (ref 0.0–0.1)
BASOPHILS RELATIVE PERCENT: 1.1 %
EOSINOPHILS ABSOLUTE COUNT: 0.3 10*9/L (ref 0.0–0.5)
EOSINOPHILS RELATIVE PERCENT: 3.8 %
HEMATOCRIT: 35.3 % (ref 34.0–44.0)
HEMOGLOBIN: 12.2 g/dL (ref 11.3–14.9)
LYMPHOCYTES ABSOLUTE COUNT: 1.1 10*9/L (ref 1.1–3.6)
LYMPHOCYTES RELATIVE PERCENT: 15.6 %
MEAN CORPUSCULAR HEMOGLOBIN CONC: 34.6 g/dL (ref 32.0–36.0)
MEAN CORPUSCULAR HEMOGLOBIN: 34 pg — ABNORMAL HIGH (ref 25.9–32.4)
MEAN CORPUSCULAR VOLUME: 98.2 fL — ABNORMAL HIGH (ref 77.6–95.7)
MEAN PLATELET VOLUME: 7.6 fL (ref 6.8–10.7)
MONOCYTES ABSOLUTE COUNT: 0.4 10*9/L (ref 0.3–0.8)
MONOCYTES RELATIVE PERCENT: 5.9 %
NEUTROPHILS ABSOLUTE COUNT: 5.3 10*9/L (ref 1.8–7.8)
NEUTROPHILS RELATIVE PERCENT: 73.6 %
NUCLEATED RED BLOOD CELLS: 0 /100{WBCs} (ref <=4)
PLATELET COUNT: 266 10*9/L (ref 150–450)
RED BLOOD CELL COUNT: 3.59 10*12/L — ABNORMAL LOW (ref 3.95–5.13)
RED CELL DISTRIBUTION WIDTH: 12.7 % (ref 12.2–15.2)
WBC ADJUSTED: 7.2 10*9/L (ref 3.6–11.2)

## 2021-10-13 LAB — COMPREHENSIVE METABOLIC PANEL
ALBUMIN: 4.2 g/dL (ref 3.4–5.0)
ALKALINE PHOSPHATASE: 86 U/L (ref 46–116)
ALT (SGPT): 7 U/L — ABNORMAL LOW (ref 10–49)
ANION GAP: 10 mmol/L (ref 5–14)
AST (SGOT): 11 U/L (ref <=34)
BILIRUBIN TOTAL: 0.4 mg/dL (ref 0.3–1.2)
BLOOD UREA NITROGEN: 16 mg/dL (ref 9–23)
BUN / CREAT RATIO: 15
CALCIUM: 10 mg/dL (ref 8.7–10.4)
CHLORIDE: 101 mmol/L (ref 98–107)
CO2: 26.2 mmol/L (ref 20.0–31.0)
CREATININE: 1.05 mg/dL — ABNORMAL HIGH
EGFR CKD-EPI (2021) FEMALE: 57 mL/min/{1.73_m2} — ABNORMAL LOW (ref >=60)
GLUCOSE RANDOM: 141 mg/dL (ref 70–179)
POTASSIUM: 4.5 mmol/L (ref 3.4–4.8)
PROTEIN TOTAL: 8 g/dL (ref 5.7–8.2)
SODIUM: 137 mmol/L (ref 135–145)

## 2021-10-13 LAB — VITAMIN B12: VITAMIN B-12: 274 pg/mL (ref 211–911)

## 2021-10-13 LAB — IRON PANEL
IRON SATURATION: 27 % (ref 20–55)
IRON: 73 ug/dL
TOTAL IRON BINDING CAPACITY: 271 ug/dL (ref 250–425)

## 2021-10-13 LAB — RETICULOCYTES
RETICULOCYTE ABSOLUTE COUNT: 70.4 10*9/L (ref 23.0–100.0)
RETICULOCYTE COUNT PCT: 1.96 % (ref 0.50–2.17)

## 2021-10-13 LAB — FERRITIN: FERRITIN: 103.9 ng/mL

## 2021-10-13 LAB — SEDIMENTATION RATE: ERYTHROCYTE SEDIMENTATION RATE: 28 mm/h (ref 0–30)

## 2021-10-13 LAB — C-REACTIVE PROTEIN: C-REACTIVE PROTEIN: 6 mg/L (ref <=10.0)

## 2021-10-13 LAB — FOLATE: FOLATE: 16.2 ng/mL (ref >=5.4)

## 2021-10-13 MED ORDER — CLONAZEPAM 0.5 MG TABLET
ORAL_TABLET | ORAL | 0 refills | 0.00000 days | Status: CP
Start: 2021-10-13 — End: ?

## 2021-10-13 NOTE — Unmapped (Addendum)
Your blood tests are still pending from today.  I will send you a MyChart message to let you know if we need to give more IV iron.  Please keep taking your oral iron.      If you need IV iron now, we will call you to schedule this. You should expect a call within the next 2 weeks to schedule your infusion.  If you do not hear within 2 weeks, please call [phone], option 1 to talk with our [facility] scheduling team. If you are unable to reach scheduling at that number, contact our office at [phone], or send a MyChart message    We will see you back in clinic in 3 months for a repeat lab check and follow-up visit.

## 2021-10-13 NOTE — Unmapped (Signed)
Hematology Consult Note    Referring Physician :  [name], MD    Primary Care Physician:   [name], MD - [facility] [place]    Reason for Consult:  Anemia    Assessment/Recommendations:  Amber Duncan is a 71 y.o. Caucasian female with a history of iron deficiency anemia, dysphonia spastica, DMT2 c/b gastroparesis, GERD, COPD, fatigue, hypothyroidism, HTN, CAD, chronic UTI, hyperlipidemia, and L breast cancer who we are seeing in consultation at the request of [name] for further evaluation of anemia.    1. Iron deficiency without anemia:  Available labs date back to 2013. First noted to be anemic in 2015; she has received IV iron at [facility] since 2017. Labs from her initial visit on 06/09/21 with Hgb 11.8, MCV 98.5, Ferritin 19.2. Most likely cause of iron deficiency is occult blood loss from AVMs and possibly GERD. She received 4 infusions of Ferrlecit (1000 mg total dose), last dose on 07/28/21, all tolerated well. She remains fatigued, still having black tarry stools, abd pain. Doesn't know when she last felt well. She has a GI new patient appt scheduled in July, is currently on the wait list for an earlier appt. Labs from today show Hgb has improved to 12.2, MCV 98, MCH 34, normal sed rate, CRP, and retics. Iron 73, iron sat 27%, ferritin 104. Vitamin B-12 274, folate 16.2.  Based on labs from today, additional IV iron is not indicated at this time. She should continue taking PO iron as tolerated. RTC in 3 months for follow up labs. All questions were answered and the patient was in agreement with this plan.    Plan:  - Labs collected today: CBC, Retic, Iron Studies: Iron, Iron Sat, Transferrin, TIBC, Ferritin and Sed Rate, CRP  - No need for additional IV iron at this time  - Continue OTC PO iron supplements as tolerated. Take 1 tab every other day, with Vitamin C. Do not take with dairy or caffeinated products.  - F/u with GI as planned; will send message to GI about an earlier visit  - Return in 3 months for repeat labs and assessment.    Factors that may be associated with increased risk/severity of hypersensitivity reaction to IV iron  History of intolerance to IV iron products no   History of >= 3 drug/food/other allergies no   Severe asthma/eczema   no   Mastocytosis     no   Severe respiratory/cardiac disease  no   Old age (>71y)    yes   Systemic inflammatory disease (RA, SLE) no   Anxiety over receiving this iron preparation? no     Schedule Ferrlecit at [facility]    History of Present Illness:   Available labs date back to 2013. First noted to be anemic 2015, with intermittent anemia since that time with Hgb 9-14 (generally normocytic). Since 2017 she has been receiving Venofer injections intermittently via [facility] and taking PO iron daily. She has some constipation requiring suppositories, but otherwise tolerates the iron ok.    She was seen by [facility] in 2017. Capsule endoscopy showed AVM in small bowel with subsequent EGD and tx of that AVM. In 10/2019 she had an episode of GIB with Hgb 7.5. Colonoscopy and EGD at that time again showed bleeding AVMs, thought to be exacerbated by anticoagulation after cardiac stent placement. Multiple AVMs were cauterized, she received 1 unit PRBCs and IV iron infusions. Has had intermittent iron deficiency  anemia since that time (Hgb 9-12). Most recent labs on 05/15/21 show Hgb 12.1, MCV 96.5, plt 288, WBC 7, ferritin 28.7, iron 85, iron sat 24. Sed rate, CRP n/a. B12 and folate have been normal historically. She is no longer on anticoagulation, does take 1 baby ASA daily.     She is post-menopausal, had a hysterectomy at 31, has had no vaginal bleeding since that time.She eats a varied diet that includes: chicken or tuna 3-5x/week, beans, leafy green vegetables. Does not have s/sx of celiac disease or inflammatory bowel disease, and does not have a hx of bariatric surgery. She is not a long distance runner or regular blood donor. She does have Hx of hepatitis, reportedly treated.    Today, she reports ongoing acid reflux and mucousy yellow stools which she associates with chronic abx for UTI. When bowels do move they are dark, which she associates with the iron. Otherwise denies known GI bleeding (melena, hematochezia).  She denies history of bleeding complications with prior surgery, excessive mucosal bleeding or abnormal bruising.  Her energy level and concentration has been poor.  She denies dietary restrictions. She admits hair loss or brittle nails. She admits pica (ice, pretzels, popcorn). She admits shortness of breath with exertion or at rest, dizziness, syncope, chest pain. She denies RLS.    IV iron treatment history [facility]):  - Feb 2017 - Aug 2017 = 1400 mg Venofer [phone])  - Feb 2018 - Mar 2018 = 600 mg Venofer [phone])  - Apr 2021 - Sep 2021 = 1800 mg Venofer (200x9)    IV iron treatment history [facility])  - Oct 2022 - Nov 2022 = 1000 mg Ferrlecit (250x4)    Interval Hx:  Since her last visit on 06/09/21, received 4 infusions of Ferrlecit (1000 mg total dose), last dose on 07/28/21, all tolerated well. She remains fatigued, and is still having black tarry stools, abd pain. Doesn't know when she last felt well. She has a new patient GI appt scheduled in July, is currently on the wait list for an earlier appt. She is taking omeprazole PRN at night. Feels like she has a UTI now, will go to PCP later today for evaluation. Still having hair loss and brittle nails. Ice cravings resolved after iron infusion, but now starting to crave popcorn again. Taking PO iron and tolerating it well.    Visit Diagnoses:  1. Iron deficiency anemia due to chronic blood loss    2. Coronary artery disease involving native coronary artery of native heart without angina pectoris        Medical History:  Past Medical History:   Diagnosis Date   ??? Breast cancer (CMS-HCC)    ??? Chronic UTI    ??? COPD (chronic obstructive pulmonary disease) (CMS-HCC)    ??? Coronary artery disease    ??? Diabetes (CMS-HCC)    ??? Disease of thyroid gland    ??? Fatigue    ??? Hepatitis    ??? Hepatitis    ??? Hyperlipidemia    ??? Hypertension    ??? Migraine    ??? Sleep apnea        Surgical History:  Past Surgical History:   Procedure Laterality Date   ??? BACK SURGERY     ??? BREAST BIOPSY     ??? BREAST LUMPECTOMY Left 01/2020   ??? CATARACT EXTRACTION     ??? CHOLECYSTECTOMY     ??? CORONARY STENT PLACEMENT     ??? HYSTERECTOMY     ???  KNEE SURGERY     ??? PR COLONOSCOPY,ABLATE LESION N/A 01/21/2017    Procedure: COLONOSCOPY FLEX; W/ABLAT LES NOT AMENABLE-SNARE;  Surgeon: Luanne Bras, MD;  Location: GI PROCEDURES [facility];  Service: Gastroenterology   ??? PR COLSC FLX W/RMVL OF TUMOR POLYP LESION SNARE TQ N/A 01/21/2017    Procedure: COLONOSCOPY FLEX; W/REMOV TUMOR/LES BY SNARE;  Surgeon: Luanne Bras, MD;  Location: GI PROCEDURES [facility];  Service: Gastroenterology   ??? PR MASTECTOMY, PARTIAL Left 01/19/2020    Procedure: MASTECTOMY, PARTIAL (EG, LUMPECTOMY, TYLECTOMY, QUADRANTECTOMY, SEGMENTECTOMY);  Surgeon: Talbert Cage, DO;  Location: MAIN OR [facility];  Service: Surgical Oncology Breast   ??? PR UPPER GI ENDOSCOPY,BIOPSY N/A 04/29/2018    Procedure: UGI ENDOSCOPY; WITH BIOPSY, SINGLE OR MULTIPLE;  Surgeon: Rona Ravens, MD;  Location: GI [facility]URES [facility];  Service: Gastroenterology   ??? RADIATION Left    ??? REDUCTION MAMMAPLASTY     ??? REPLACEMENT TOTAL KNEE Right        No history of bleeding complications with past surgeries    Social History:  Social History     Socioeconomic History   ??? Marital status: Divorced     Spouse name: None   ??? Number of children: None   ??? Years of education: None   ??? Highest education level: None   Tobacco Use   ??? Smoking status: Former     Packs/day: 2.00     Years: 48.00     Pack years: 96.00     Types: Cigarettes     Quit date: 11/06/2011     Years since quitting: 9.9   ??? Smokeless tobacco: Never   Vaping Use   ??? Vaping Use: Never used   Substance and Sexual Activity   ??? Alcohol use: No   ??? Drug use: No   Social History Narrative    She lives in [place]. 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 She denies alcohol and illicit drug use.       Social Determinants of Health     Financial Resource Strain: Medium Risk   ??? Difficulty of Paying Living Expenses: Somewhat hard   Food Insecurity: Food Insecurity Present   ??? Worried About Programme researcher, broadcasting/film/video in the Last Year: Sometimes true   ??? Ran Out of Food in the Last Year: Never true   Transportation Needs: No Transportation Needs   ??? Lack of Transportation (Medical): No   ??? Lack of Transportation (Non-Medical): No       Amber Duncan was born in [place] and currently lives in [place], alone. Retired, worked as a Copy at [employer], and in Designer, fashion/clothing. Still occasionally cleans houses. Hobbies include seeing live music, going to the beach, going out to eat.     Tobacco history: Former, quit 9 years ago. Smoked for 45 years prior to that.  Alcohol history: Denies  Illicit drug history: Denies    Family History:  family history includes Cancer in her brother, brother, maternal aunt, and maternal uncle; Diabetes in her paternal uncle; No Known Problems in her brother, daughter, maternal grandfather, maternal grandmother, paternal grandfather, paternal grandmother, sister, son, and another family member; Other in her father, mother, sister, and sister.     Sister with iron deficiency anemia. No family hx of bleeding or clotting disorders    Allergies:  Patient has no known allergies.    Medications:   Current Outpatient Medications  Medication Sig Dispense Refill   ??? aspirin-calcium carbonate 81 mg-300 mg calcium(777 mg) Tab Take 81 mg by mouth.     ??? blood sugar diagnostic (ONETOUCH ULTRA TEST) Strp Test blood sugars once daily or as directed 100 strip 3   ??? clonazePAM (KLONOPIN) 0.5 MG tablet TAKE 1 AND 1/2 TABLETS BY MOUTH TWICE DAILY AS DISCUSSED 90 tablet 0   ??? d-mannose 500 mg cap Take 1,500 mg by mouth daily. 90 capsule 11   ??? docusate sodium (COLACE) 100 MG capsule Take 100 mg by mouth daily as needed.     ??? empty container (SHARPS-A-GATOR DISPOSAL SYSTEM) Misc Use as directed for sharps disposal 1 each 2   ??? estradioL (ESTRACE) 0.01 % (0.1 mg/gram) vaginal cream Insert 2 g into the vagina Two (2) times a week. 16 g 11   ??? evolocumab (REPATHA SURECLICK) 140 mg/mL PnIj Inject the contents of 1 pen (140 mg) under the skin every fourteen (14) days. 2 mL 11   ??? ferrous gluconate 324 MG tablet Take 324 mg by mouth once.     ??? FLUoxetine (PROZAC) 10 MG tablet Take 1 tablet (10 mg total) by mouth daily. 90 tablet 3   ??? levETIRAcetam (KEPPRA) 1000 MG tablet Take 1 tablet (1,000 mg total) by mouth Two (2) times a day. 180 tablet 3   ??? lisinopriL (PRINIVIL,ZESTRIL) 20 MG tablet Take 1 tablet (20 mg total) by mouth daily. 90 tablet 3   ??? metFORMIN (GLUCOPHAGE) 500 MG tablet Take 2 tablets (1,000 mg total) by mouth in the morning and 2 tablets (1,000 mg total) in the evening. Take with meals. 360 tablet 1   ??? omeprazole 20 mg TbLD Take by mouth nightly as needed.     ??? pravastatin (PRAVACHOL) 10 MG tablet Take 0.5 tablets (5 mg total) by mouth daily. 45 tablet 3   ??? SUMAtriptan (IMITREX) 100 MG tablet Take one tab at onset of headache, may repeat in 2 hours if headache persists of recurs 9 tablet 5   ??? SUMAtriptan (IMITREX) 100 MG tablet Take 1 tablet (100 mg total) by mouth once as needed for migraine. 12 tablet 3   ??? SYNTHROID 100 mcg tablet Take 1 tablet (100 mcg total) by mouth daily. 90 tablet 3     No current facility-administered medications for this visit.       Review of Systems:  As per HPI, otherwise negative x 12 systems.    Objective :  Vitals:    10/13/21 0924   BP: 164/87   BP Site: L Arm   BP Position: Sitting   BP Cuff Size: Medium   Pulse: 80   Temp: 36.2 ??C (97.1 ??F)   TempSrc: Temporal   SpO2: 99%   Weight: 84.6 kg (186 lb 9.6 oz) Height: 167.6 cm (5' 5.98)       Physical Exam:  GEN: Well-appearing female in no acute distress.  HEENT: Normocephalic, no conjunctival injection, sclera anicteric.  RESP: Breathing unlabored  NEURO: A&Ox3, CNII-XII grossly intact.  PSY: Appropriate affect, reasonable insight and judgment.    Test Results  Lab Results   Component Value Date    WBC 7.2 10/13/2021    HGB 12.2 10/13/2021    HCT 35.3 10/13/2021    PLT 266 10/13/2021          Lab Results   Component Value Date    IRON 73 10/13/2021    TIBC 271 10/13/2021    FERRITIN 103.9 10/13/2021  Results for orders placed or performed in visit on 10/13/21   Vitamin B12 Level   Result Value Ref Range    Vitamin B-12 274 211 - 911 pg/ml   Sedimentation Rate   Result Value Ref Range    Sed Rate 28 0 - 30 mm/h   Reticulocytes   Result Value Ref Range    Reticulocyte Auto % 1.96 0.50 - 2.17 %    Absolute Auto Reticulocyte 70.4 23.0 - 100.0 10*9/L   Iron Panel   Result Value Ref Range    Iron 73 50 - 170 ug/dL    TIBC 161 096 - 045 ug/dL    Iron Saturation (%) 27 20 - 55 %   Folate Level   Result Value Ref Range    Folate 16.2 >=5.4 ng/mL   Ferritin   Result Value Ref Range    Ferritin 103.9 7.3 - 270.7 ng/mL   C-reactive protein   Result Value Ref Range    CRP 6.0 <=10.0 mg/L   Comprehensive Metabolic Panel   Result Value Ref Range    Sodium 137 135 - 145 mmol/L    Potassium 4.5 3.4 - 4.8 mmol/L    Chloride 101 98 - 107 mmol/L    CO2 26.2 20.0 - 31.0 mmol/L    Anion Gap 10 5 - 14 mmol/L    BUN 16 9 - 23 mg/dL    Creatinine 4.09 (H) 0.60 - 0.80 mg/dL    BUN/Creatinine Ratio 15     eGFR CKD-EPI (2021) Female 57 (L) >=60 mL/min/1.23m2    Glucose 141 70 - 179 mg/dL    Calcium 81.1 8.7 - 91.4 mg/dL    Albumin 4.2 3.4 - 5.0 g/dL    Total Protein 8.0 5.7 - 8.2 g/dL    Total Bilirubin 0.4 0.3 - 1.2 mg/dL    AST 11 <=78 U/L    ALT 7 (L) 10 - 49 U/L    Alkaline Phosphatase 86 46 - 116 U/L   CBC w/ Differential   Result Value Ref Range    WBC 7.2 3.6 - 11.2 10*9/L    RBC 3.59 (L) 3.95 - 5.13 10*12/L    HGB 12.2 11.3 - 14.9 g/dL    HCT 29.5 62.1 - 30.8 %    MCV 98.2 (H) 77.6 - 95.7 fL    MCH 34.0 (H) 25.9 - 32.4 pg    MCHC 34.6 32.0 - 36.0 g/dL    RDW 65.7 84.6 - 96.2 %    MPV 7.6 6.8 - 10.7 fL    Platelet 266 150 - 450 10*9/L    nRBC 0 <=4 /100 WBCs    Neutrophils % 73.6 %    Lymphocytes % 15.6 %    Monocytes % 5.9 %    Eosinophils % 3.8 %    Basophils % 1.1 %    Absolute Neutrophils 5.3 1.8 - 7.8 10*9/L    Absolute Lymphocytes 1.1 1.1 - 3.6 10*9/L    Absolute Monocytes 0.4 0.3 - 0.8 10*9/L    Absolute Eosinophils 0.3 0.0 - 0.5 10*9/L    Absolute Basophils 0.1 0.0 - 0.1 10*9/L       I personally spent 37 minutes face-to-face and non-face-to-face in the care of the patient on the date of service.  This included taking a history, examining the patient, reviewing records, developing and discussing the management plan, documentation, and communications with other health care professionals.       Glendell Docker, NP  [facility]

## 2021-10-23 NOTE — Unmapped (Signed)
[facility] Refill Coordination Note    Specialty Medication(s) to be Shipped:   General Specialty: Repatha    Other medication(s) to be shipped: No additional medications requested for fill at this time     Amber Duncan, DOB: 1951-03-09  Phone: [phone] (home)       All above HIPAA information was verified with patient.     Was a Nurse, learning disability used for this call? No    Completed refill call assessment today to schedule patient's medication shipment from the [facility] [phone]).  All relevant notes have been reviewed.     Specialty medication(s) and dose(s) confirmed: Regimen is correct and unchanged.   Changes to medications: Amber Duncan reports no changes at this time.  Changes to insurance: No  New side effects reported not previously addressed with a pharmacist or physician: None reported  Questions for the pharmacist: No    Confirmed patient received a Conservation officer, historic buildings and a Surveyor, mining with first shipment. The patient will receive a drug information handout for each medication shipped and additional FDA Medication Guides as required.       DISEASE/MEDICATION-SPECIFIC INFORMATION        For patients on injectable medications: Patient currently has 0 doses left.  Next injection is scheduled for 10/30/21.    SPECIALTY MEDICATION ADHERENCE     Medication Adherence    Patient reported X missed doses in the last month: 0  Specialty Medication: Repatha 140mg /ml  Patient is on additional specialty medications: No  Patient is on more than two specialty medications: No              Were doses missed due to medication being on hold? No    Repatha 140 mg/ml: 0 days of medicine on hand     REFERRAL TO PHARMACIST     Referral to the pharmacist: Not needed      [facility]     Shipping address confirmed in Epic.     Delivery Scheduled: Yes, Expected medication delivery date: 10/29/21.     Medication will be delivered via Same Day Courier to the prescription address in Epic WAM.    Amber Duncan [facility] Specialty Technician

## 2021-10-28 MED FILL — REPATHA SURECLICK 140 MG/ML SUBCUTANEOUS PEN INJECTOR: SUBCUTANEOUS | 28 days supply | Qty: 2 | Fill #2

## 2021-10-28 NOTE — Unmapped (Signed)
Abstraction Result Flowsheet Data    This patient's last AWV date: [facility] Last Medicare Wellness Visit Date: Not Found  This patients last WCC/CPE date: : Not Found      Reason for Encounter  Reason for Encounter: Outreach  Primary Reason for Outreach: AWV  Text Message: No  MyChart Message: No  Outreach Call Outcome: Unable to contact                                        Dear Amber Duncan,    [place] [facility] is committed to helping you stay healthy. Our records show you are due for a Medicare Annual Wellness Visit.    A benefit for anyone with Medicare, this visit is designed to help prevent illness based on your current health and risk factors--at no cost to you.     An Annual Wellness Visit may include education or counseling about immunizations, and important health measurements such as blood pressure checks, screenings, and referrals for other care if needed.    Please reply to this message with the word ???SCHEDULE,??? and we will contact you to schedule your appointment.    Thank you for the opportunity to care for you.    [name], MD  [facility] AT [facility]

## 2021-11-10 MED ORDER — CLONAZEPAM 0.5 MG TABLET
ORAL_TABLET | 0 refills | 0 days | Status: CP
Start: 2021-11-10 — End: ?

## 2021-11-10 MED ORDER — FLUOXETINE 10 MG CAPSULE
ORAL_CAPSULE | Freq: Every day | ORAL | 2 refills | 30 days | Status: CP
Start: 2021-11-10 — End: 2022-11-10

## 2021-11-10 NOTE — Unmapped (Signed)
Patient is requesting the following refill.  Insurance prefers capsules RX changed to cap.  Requested Prescriptions     Pending Prescriptions Disp Refills   ??? FLUoxetine (PROZAC) 10 MG capsule 30 capsule 2     Sig: Take 1 capsule (10 mg total) by mouth daily.       Recent Visits  Date Type Provider Dept   07/15/21 Office Visit [name], MD [place] [facility]   06/17/21 Office Visit Desmond Dike, NP [place] [facility]   05/15/21 Office Visit [name], MD [place] [facility]   01/13/21 Office Visit [name], MD [place] [facility]   Showing recent visits within past 365 days with a meds authorizing provider and meeting all other requirements  Future Appointments  Date Type Provider Dept   01/12/22 Appointment [name], MD [place] [facility]   Showing future appointments within next 365 days with a meds authorizing provider and meeting all other requirements

## 2021-11-19 NOTE — Unmapped (Signed)
[facility] Refill Coordination Note    Specialty Medication(s) to be Shipped:   General Specialty: Repatha    Other medication(s) to be shipped: No additional medications requested for fill at this time     Amber Duncan, DOB: Aug 05, 1951  Phone: [phone] (home)       All above HIPAA information was verified with patient.     Was a Nurse, learning disability used for this call? No    Completed refill call assessment today to schedule patient's medication shipment from the [facility] [phone]).  All relevant notes have been reviewed.     Specialty medication(s) and dose(s) confirmed: Regimen is correct and unchanged.   Changes to medications: Amber Duncan reports no changes at this time.  Changes to insurance: No  New side effects reported not previously addressed with a pharmacist or physician: None reported  Questions for the pharmacist: No    Confirmed patient received a Conservation officer, historic buildings and a Surveyor, mining with first shipment. The patient will receive a drug information handout for each medication shipped and additional FDA Medication Guides as required.       DISEASE/MEDICATION-SPECIFIC INFORMATION        For patients on injectable medications: Patient currently has 0 doses left.  Next injection is scheduled for 11/27/21.    SPECIALTY MEDICATION ADHERENCE     Medication Adherence    Specialty Medication: Repatha 140mg /ml  Patient is on additional specialty medications: No  Patient is on more than two specialty medications: No              Were doses missed due to medication being on hold? No    Repatha 140 mg/ml: 0 days of medicine on hand       REFERRAL TO PHARMACIST     Referral to the pharmacist: Not needed      Amber Duncan     Shipping address confirmed in Epic.     Delivery Scheduled: Yes, Expected medication delivery date: 11/24/21.     Medication will be delivered via Same Day Courier to the prescription address in Epic WAM.    Amber Duncan [facility] Specialty Technician

## 2021-11-20 NOTE — Unmapped (Signed)
Abstraction Result Flowsheet Data    This patient's last AWV date: [facility] Last Medicare Wellness Visit Date: Not Found  This patients last WCC/CPE date: : Not Found      Reason for Encounter  Reason for Encounter: Outreach  Primary Reason for Outreach: AWV  MyChart Message: No  Outreach Call Outcome: Other (pt will sch after next appt very busy)                                      Dear Amber Duncan,    [place] [facility] is committed to helping you stay healthy. Our records show you are due for a Medicare Annual Wellness Visit.    A benefit for anyone with Medicare, this visit is designed to help prevent illness based on your current health and risk factors--at no cost to you.     An Annual Wellness Visit may include education or counseling about immunizations, and important health measurements such as blood pressure checks, screenings, and referrals for other care if needed.    Please reply to this message with the word ???SCHEDULE,??? and we will contact you to schedule your appointment.    Thank you for the opportunity to care for you.    [name], MD  [facility] AT [facility]

## 2021-11-24 MED FILL — REPATHA SURECLICK 140 MG/ML SUBCUTANEOUS PEN INJECTOR: SUBCUTANEOUS | 28 days supply | Qty: 2 | Fill #3

## 2021-11-24 NOTE — Unmapped (Signed)
FINL [id number]  [facility]UROLOG[facility] [place] [address] [place]  [address]  [place] [address]  [phone]    Date: 11/25/2021  Patient Name: Amber Duncan  MRN: [id number]  PCP: Loree Fee, MD    Assessment:      Amber Duncan is a 71 y.o. female who  has a past medical history of Breast cancer (CMS-HCC), Chronic UTI, COPD (chronic obstructive pulmonary disease) (CMS-HCC), Coronary artery disease, Diabetes (CMS-HCC), Disease of thyroid gland, Fatigue, Hepatitis, Hepatitis, Hyperlipidemia, Hypertension, Migraine, and Sleep apnea. 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     Cervical level Spinal myoclonus: Was doing well until last week, but this week improving. I recommend no changes in medications as her symptoms are improving compared to last week. I encouraged exercise.       Plan:      Patient Instructions   1. No changes to your medications today  2. If this current worsening continues for more than another 2 weeks, please let me know and we will plan to increase the dose of one of the medications  3. I agree with re-checking iron levels as your dysphonia tends to get worse when your iron levels are low  4. We discussed that you are not interested in resuscitation or feeding tubes, I will make sure to document this in your chart.  5. Follow up in 6 months time     Health education/[place] literacy:   Patient education was provided. Reviewed the differential diagnosis, plan of care in details as well as other elements as details above. The benfits and risks of each procedure and medications were discussed with the patient. Alternative options were reviewed with the paient.     Total face - to - face time included 25 minutes > 50 % in direct consultation reviewing details of history, examination and data findings , discussing my clinical reasoning and clinical impression, as well as a review of my recommendations for a plan of treatment as documented above. Additional time was provided to address alternative options of care, health literacy regarding the differential diagnosis, testing and medication options, the benefits of current plan as well as an opportunity to address all the patient???s questions and concerns. Rehabilitation services, including physical therapy, occupational therapy, speech therapy, independent cardiovascular exercise, and group exercise classes, were considered and discussed with the patient. I spent another 5 minutes in non face to face patient care on the date of this visit.    A written summary was provided to the patient at the time of the visit, as well as instructions on how to access My Chart. My contact information was provided along with instructions on ow to reach the administrative office and the clinic.        Subjective:      HPI: Amber Duncan is a 71 y.o. Caucasian right handed female who presents for follow up to the [place] [facility] Neurology Outpatient Department for follow up of myoclonus of chest and breathing muscles. Please review my initial clinic visit note dated on 06/17/16 for details regarding history of present illness.    Briefly, Her symptoms began in 2014 without clear inciting event. Initially symptoms were mild and intermittent, but she soon developed quite severe symptoms of breathlessness, and difficulty controlling her voice, as well as the feeling that she was being squeezed in her upper abdomen.  She reports that an MRI of her brain 3 years ago at onset was read as normal. She had an EMG for concern for myasthenia gravis, which was negative. Clonazepam was tried at 0.5 mg qhs, she could not tolerate this and it did not seem to help her symptoms. She had been on Gabapentin for headaches, this did not help headaches or breathing issues so was stopped. She was seen by Dr. Vergie Living in ENT, where examination did not reveal evidence of vocal cord dystonia. He referred her to movement disorders for evaluation. She denies history of exposure to anti-dopaminergic medications. Chest fluoroscopic evaluation 06/18/16 with sniff test revealed no abnormal or paradoxical movements of the chest wall at rest, with deep breathing, during speech, or while sniffing (I was present for this test). MRIc-spine demonstrated DJD, significant at C4-5, without impingement of cord or cord signal abnormality (images reviewed by me, also reviewed together with neuroradiology). Initially, Clonazepam improved her symptoms dramatically. However this stopped being effective, despite escalating dose. I switched her to Keppra late February 2018. This helped initially. She developed worsened symptoms along with generalized malaise, weight loss/nausea, bone pain of unclear etiology summer 2018, she had very extensive medical workup including infectious, endocrine, rheumatological, and neoplastic. I sent tic-borne illness panel which revealed Erlichiosis, we started treatment for this which produced partial improvement of her general symptoms. We weaned off Clonazepam and increased Keppra to 500 mg BID which helped initially, but symptoms returned so we restarted Clonazepam. I offered her methylphenidate 5mg  for her extreme fatigue, but this caused elevated BP so was stopped. I suggested she see her sleep doctor to investigate fatigue further. Had a RCA cardiac stent placed November 2020 and was diagnosed with breast cancer March 2021, underwent surgical lumpectomy 01/2020 and XRT starting late June 2021. She continues to have flares in her symptoms at times requiring adjustments to her medications.    Interim History: She had been doing quite well until about 2 weeks ago. Last week started noticing worsened symptoms, seemed to go on all day, she barely left the house, was only able to whisper, this week better. She has been also noticing some tremor in the hands in the morning when waking up, when reaching for things. This improves quickly. She admits that she is concerned about Parkinson's disease, her cousin has this.     She is scheduled for iron infusion later in the Spring.     Her PCP started Prozac for depression back in November.     Of note, she states that she would not want feeding tubes or resuscitation. She has notorized papers, and her son Fayrene Fearing is her health care POA.    Current medications for myoclonus:  Keppra 1000 mg BID  Clonazepam 0.5 mg tabs 1.5 tabs BID    SE: none at this time.    Past Medical Hx:    Past Medical History:   Diagnosis Date   ??? Breast cancer (CMS-HCC)    ??? Chronic UTI    ??? COPD (chronic obstructive pulmonary disease) (CMS-HCC)    ??? Coronary artery disease    ??? Diabetes (CMS-HCC)    ??? Disease of thyroid gland    ??? Fatigue    ??? Hepatitis    ??? Hepatitis    ??? Hyperlipidemia    ??? Hypertension    ??? Migraine    ??? Sleep apnea        Past Surgical Hx:    Past Surgical History:   Procedure Laterality  Date   ??? BACK SURGERY     ??? BREAST BIOPSY     ??? BREAST LUMPECTOMY Left 01/2020   ??? CATARACT EXTRACTION     ??? CHOLECYSTECTOMY     ??? CORONARY STENT PLACEMENT     ??? HYSTERECTOMY     ??? KNEE SURGERY     ??? PR COLONOSCOPY,ABLATE LESION N/A 01/21/2017    Procedure: COLONOSCOPY FLEX; W/ABLAT LES NOT AMENABLE-SNARE;  Surgeon: Luanne Bras, MD;  Location: GI PROCEDURES [facility];  Service: Gastroenterology   ??? PR COLSC FLX W/RMVL OF TUMOR POLYP LESION SNARE TQ N/A 01/21/2017    Procedure: COLONOSCOPY FLEX; W/REMOV TUMOR/LES BY SNARE;  Surgeon: Luanne Bras, MD;  Location: [facility];  Service: Gastroenterology   ??? PR MASTECTOMY, PARTIAL Left 01/19/2020    Procedure: MASTECTOMY, PARTIAL (EG, LUMPECTOMY, TYLECTOMY, QUADRANTECTOMY, SEGMENTECTOMY);  Surgeon: Talbert Cage, DO;  Location: MAIN OR [facility];  Service: Surgical Oncology Breast   ??? PR UPPER GI ENDOSCOPY,BIOPSY N/A 04/29/2018    Procedure: UGI ENDOSCOPY; WITH BIOPSY, SINGLE OR MULTIPLE;  Surgeon: Rona Ravens, MD;  Location: [facility];  Service: Gastroenterology   ??? RADIATION Left    ??? REDUCTION MAMMAPLASTY     ??? REPLACEMENT TOTAL KNEE Right        Social Hx:    Social History     Socioeconomic History   ??? Marital status: Divorced     Spouse name: None   ??? Number of children: None   ??? Years of education: None   ??? Highest education level: None   Tobacco Use   ??? Smoking status: Former     Packs/day: 2.00     Years: 48.00     Pack years: 96.00     Types: Cigarettes     Quit date: 11/06/2011     Years since quitting: 10.0     Passive exposure: Past   ??? Smokeless tobacco: Never   Vaping Use   ??? Vaping Use: Never used   Substance and Sexual Activity   ??? Alcohol use: No   ??? Drug use: No   Social History Narrative    She lives in [place]. 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 She denies alcohol and illicit drug use.       Social Determinants of Health     Transportation Needs: No Transportation Needs   ??? Lack of Transportation (Medical): No   ??? Lack of Transportation (Non-Medical): No       Family Hx:    Family History   Problem Relation Age of Onset   ??? Cancer Brother         liver   ??? Cancer Maternal Aunt    ??? Cancer Maternal Uncle    ??? Diabetes Paternal Uncle    ??? Other Mother         MVA   ??? Other Father         MVA   ??? No Known Problems Sister    ??? No Known Problems Son    ??? Cancer Brother         Lung   ??? Other Sister         MVA   ??? Other Sister         MVA drunk driver   ??? No Known Problems Brother    ??? No Known Problems Daughter    ??? No Known Problems  Maternal Grandmother    ??? No Known Problems Maternal Grandfather    ??? No Known Problems Paternal Grandmother    ??? No Known Problems Paternal Grandfather    ??? No Known Problems Other    ??? Heart failure Neg Hx    ??? BRCA 1/2 Neg Hx    ??? Breast cancer Neg Hx    ??? Colon cancer Neg Hx    ??? Endometrial cancer Neg Hx    ??? Ovarian cancer Neg Hx ALLERGIES:  No Known Allergies    CURRENT MEDICATIONS:    Current Outpatient Medications   Medication Sig Dispense Refill   ??? aspirin-calcium carbonate 81 mg-300 mg calcium(777 mg) Tab Take 81 mg by mouth.     ??? blood sugar diagnostic (ONETOUCH ULTRA TEST) Strp Test blood sugars once daily or as directed 100 strip 3   ??? clonazePAM (KLONOPIN) 0.5 MG tablet Take up to 2 tabs twice daily as directed. 180 tablet 0   ??? d-mannose 500 mg cap Take 1,500 mg by mouth daily. 90 capsule 11   ??? docusate sodium (COLACE) 100 MG capsule Take 1 capsule (100 mg total) by mouth daily as needed.     ??? empty container (SHARPS-A-GATOR DISPOSAL SYSTEM) Misc Use as directed for sharps disposal 1 each 2   ??? evolocumab (REPATHA SURECLICK) 140 mg/mL PnIj Inject the contents of 1 pen (140 mg) under the skin every fourteen (14) days. 2 mL 11   ??? ferrous gluconate 324 MG tablet Take 1 tablet (324 mg total) by mouth once.     ??? FLUoxetine (PROZAC) 10 MG capsule Take 1 capsule (10 mg total) by mouth daily. 30 capsule 2   ??? levETIRAcetam (KEPPRA) 1000 MG tablet Take 1 tablet (1,000 mg total) by mouth Two (2) times a day. 180 tablet 3   ??? lisinopriL (PRINIVIL,ZESTRIL) 20 MG tablet Take 1 tablet (20 mg total) by mouth daily. 90 tablet 3   ??? metFORMIN (GLUCOPHAGE) 500 MG tablet Take 2 tablets (1,000 mg total) by mouth in the morning and 2 tablets (1,000 mg total) in the evening. Take with meals. 360 tablet 1   ??? omeprazole 20 mg TbLD Take by mouth nightly as needed.     ??? pravastatin (PRAVACHOL) 10 MG tablet Take 0.5 tablets (5 mg total) by mouth daily. 45 tablet 3   ??? SYNTHROID 100 mcg tablet Take 1 tablet (100 mcg total) by mouth daily. 90 tablet 3   ??? estradioL (ESTRACE) 0.01 % (0.1 mg/gram) vaginal cream Insert 2 g into the vagina Two (2) times a week. (Patient not taking: Reported on 11/25/2021) 16 g 11   ??? SUMAtriptan (IMITREX) 100 MG tablet Take one tab at onset of headache, may repeat in 2 hours if headache persists of recurs (Patient not taking: Reported on 11/25/2021) 9 tablet 5   ??? SUMAtriptan (IMITREX) 100 MG tablet Take 1 tablet (100 mg total) by mouth once as needed for migraine. (Patient not taking: Reported on 11/25/2021) 12 tablet 3     No current facility-administered medications for this visit.       Review of Systems:  A 10-systems review was performed and, unless otherwise noted, declared negative by patient.        Objective:   ??  Objective [] Expand by Default     Physical Exam:  Height 5'6'', weight 186 lbs.   ??  Sitting BP 140/81 HR 85  Standing: BP 136/74 HR 66  ??  Neurological Examination:  Motor: symmetric bulk throughout. No fasciculations.     ??  MOVEMENT DISORDERS EXAMINATION:   Facial expression was normal. Speech is strained soon after initiating speech but remains legible and vocalization does not cease.  There was no evidence of tremor. She could get up from a low lying chair without help of both hands.  Gait was steady with good heel to toe stride, stride length was normal. Speed was normal. Arm swing was normal bilaterally.   ??  There is vocalization but is strained soon after starting to speak, but does not lose the vocalization. Voice is at times quiet..    Gait: Normal gait as above      Diagnostic Studies and Review of Records:      Studies reviewed:  MRI C-spine: multilevel DJD, most prominent at C4-5, no cord signal abnormality to my eye, and reviewed with neuroradiologist    Fluoroscopic chest evaluation with sniff test: no abnormal movement of diaphragm or other chest wall structures with rest, normal breathing, deep breathing, speech, or sniffing    Ehrlichia antibodies: 1:256 04/2017 and SAME on repeat 05/2017    ACTH stimulation test 07/2017: normal    Cortisol: normal    TSH: 0.50 (slightly low)

## 2021-11-25 ENCOUNTER — Ambulatory Visit: Admit: 2021-11-25 | Discharge: 2021-11-26 | Payer: PRIVATE HEALTH INSURANCE | Attending: Neurology | Primary: Neurology

## 2021-11-25 MED ORDER — CLONAZEPAM 0.5 MG TABLET
ORAL_TABLET | 0 refills | 0 days | Status: CP
Start: 2021-11-25 — End: ?

## 2021-11-25 NOTE — Unmapped (Signed)
No changes to your medications today  If this current worsening continues for more than another 2 weeks, please let me know and we will plan to increase the dose of one of the medications  I agree with re-checking iron levels as your dysphonia tends to get worse when your iron levels are low  We discussed that you are not interested in resuscitation or feeding tubes, I will make sure to document this in your chart.  Follow up in 6 months time

## 2021-12-02 ENCOUNTER — Other Ambulatory Visit: Payer: Self-pay

## 2021-12-02 DIAGNOSIS — Z87891 Personal history of nicotine dependence: Secondary | ICD-10-CM

## 2021-12-17 NOTE — Unmapped (Signed)
[facility] Refill Coordination Note    Specialty Medication(s) to be Shipped:   General Specialty: Repatha    Other medication(s) to be shipped: No additional medications requested for fill at this time     Amber Duncan, DOB: [birth year]  Phone: [phone] (home)       All above HIPAA information was verified with patient.     Was a Nurse, learning disability used for this call? No    Completed refill call assessment today to schedule patient's medication shipment from the [facility] [phone]).  All relevant notes have been reviewed.     Specialty medication(s) and dose(s) confirmed: Regimen is correct and unchanged.   Changes to medications: Lesia reports no changes at this time.  Changes to insurance: No  New side effects reported not previously addressed with a pharmacist or physician: None reported  Questions for the pharmacist: No    Confirmed patient received a Conservation officer, historic buildings and a Surveyor, mining with first shipment. The patient will receive a drug information handout for each medication shipped and additional FDA Medication Guides as required.       DISEASE/MEDICATION-SPECIFIC INFORMATION        For patients on injectable medications: Patient currently has 0 doses left.  Next injection is scheduled for 12/25/21.    SPECIALTY MEDICATION ADHERENCE     Medication Adherence    Patient reported X missed doses in the last month: 0  Specialty Medication: Repatha 140mg /ml  Patient is on additional specialty medications: No  Patient is on more than two specialty medications: No              Were doses missed due to medication being on hold? No    Repatha 140 mg/ml: 0 days of medicine on hand       REFERRAL TO PHARMACIST     Referral to the pharmacist: Not needed      [facility]     Shipping address confirmed in Epic.     Delivery Scheduled: Yes, Expected medication delivery date: 12/23/21.     Medication will be delivered via Same Day Courier to the prescription address in Epic WAM.    Nancy Nordmann [facility] Specialty Technician

## 2021-12-19 ENCOUNTER — Ambulatory Visit
Admission: RE | Admit: 2021-12-19 | Discharge: 2021-12-19 | Disposition: A | Discharge status: Home / Self Care | Payer: PPO | Source: Ambulatory Visit | Attending: Family | Admitting: Family

## 2021-12-19 DIAGNOSIS — Z87891 Personal history of nicotine dependence: Secondary | ICD-10-CM | POA: Diagnosis not present

## 2021-12-22 ENCOUNTER — Other Ambulatory Visit: Payer: Self-pay | Admitting: Acute Care

## 2021-12-22 DIAGNOSIS — Z122 Encounter for screening for malignant neoplasm of respiratory organs: Secondary | ICD-10-CM

## 2021-12-22 DIAGNOSIS — Z87891 Personal history of nicotine dependence: Secondary | ICD-10-CM

## 2021-12-23 MED FILL — REPATHA SURECLICK 140 MG/ML SUBCUTANEOUS PEN INJECTOR: SUBCUTANEOUS | 28 days supply | Qty: 2 | Fill #4

## 2022-01-04 NOTE — Unmapped (Signed)
Assessment/Plan:    Amber Duncan was seen today for follow-up.    Diagnoses and all orders for this visit:    Diabetes mellitus without complication (CMS-HCC)  -     POCT glycosylated hemoglobin (Hb A1C); Future  -     POCT glycosylated hemoglobin (Hb A1C)    Hypothyroidism, unspecified type  -     TSH; Future  -     TSH    Hx of iron deficiency anemia    Urinary frequency    Essential hypertension    Coronary artery disease involving native coronary artery of native heart without angina pectoris    Dysphonia spastica    Chronic UTI  -     cephalexin (KEFLEX) 250 MG capsule; Take 1 capsule daily for UTI prevention    Mixed hyperlipidemia    Malignant neoplasm of lower-inner quadrant of left breast in female, estrogen receptor positive (CMS-HCC)    Depression, unspecified depression type  -     FLUoxetine (PROZAC) 20 MG capsule; Take 1 capsule (20 mg total) by mouth daily.    Lung nodules  -     Ambulatory referral to Pulmonology; Future    Chronic obstructive pulmonary disease, unspecified COPD type (CMS-HCC)  -     Ambulatory referral to Pulmonology; Future    -Diabetes mellitus with complications  CMP done in 10/2021 revealed normal serum electrolytes, creatinine 1.0, glucose of 141 and normal LFTs.   DM has been well controlled. Continue metformin 1000 mg BID and diabetic diet.  Repeat HgA1c today is 5.4    -CAD/dyslipidemia: she is followed by [facility] cardiology and last seen in 09/2021 and encounter details reviewed with the pt today.    She continues on Repatha and low dose pravastatin as previously directed.  Her LDL cholesterol was 67 in 16/1096 and LFT's were normal in 10/2021. She will see cardiology in 1 yr.     -left sided Breast cancer history: infiltrating ductal carcinoma, estrogen receptor positive, progesterone receptor positive and HER2 negative s/p lumpectomy in 01/2020 and follow up radiation Rx completed in 02/2020.   She is   Followed annually by breast oncology, seen early this year. She had a normal mammogram and breast US in 09/2021.    -Dysuria with history of chronic UTIs and microscopic hematuria   She was referred to [facility] urology and she was seen in  04/2021 and advised to follow up in 3 months time.  She was started on macrodantin 100 mg daily for UTI prevention, but stopped due to side effects. She had cystoscopy last year which was normal. She was to start estrace vaginal cream twice a week and  Consider low dose daily keflex instead. She had UTI early this year.  She is started on Keflex 250 mg tab daily for UTI prevention.  She will continue daily D Mannose and cranberry tablet, regimen which has helped as well.       -History of anemia - chronic iron def anemia  The pt is followed by [facility] hematology and last visit was in 10/2021 and  encounter details reviewed with the pt today.  She was advised to re establish care with [facility] GI. She has a history of AVM's with intermittent bleeding.  She has had several IV infusions ( a total of 4 infusions in 19/2022).  Patient had colonoscopy done in 2018 which revealed diverticulosis and 4 colon polyps.  She was advised to repeat colonoscopy in 3 years time.  She had GI bleed in  10/2019 and EGD and colonoscopy were done and pt reports no bleeding source was identified..  Bleeding was attributed to blood thinner which  pt was taking at the time and it was discontinued.  She is on low dose daily enteric aspirin instead, as per cardiology recommendation. CBC done in 10/2021 was normal except for MCV of 98.  Iron and Ferritin were normal along with normal Folate level and low normal Vit B 12 of 264. I recommend starting an OTC Vit b12 supplement at 500 mcg daily.  She was referred to [facility] GI and her appt is in 04/2022 and her sooner appt has been cancelled and rescheduled.  She needs a sooner appt.  She has stool incontinence and he stools are dark.  I offerred a GI referral to [facility] and [facility] and she declined. She is going to wait to go to [facility]. She is advised to go to [facility] ED if she worsens in any way.   She has a hematology appt tomorrow and I recommend repeating a CBC then.       -Hypothyroidism: pt continues on levothyroxine supplementation as directed. Her TSH was normal in 01/2021. She is due for repeat TSH today.    -Hypertension:  Patient remains compliant with her current medication regimen.  She has been counseled on maintaining low-salt and low-fat dietary intake and exercise routine.    CMP done 10/2021 revealed creatinine level of 1 with normal serum electrolytes and LFT's.    Cervical level spinal myoclonus, Dysphonia spastica and migraine headache history:  She is followed by [facility] neurology and last seen in 11/2021 and encounter details reviewed with the pt today. The following plan was discussed:  Plan  Patient Instructions   No changes to your medications today  If this current worsening continues for more than another 2 weeks, please let me know and we will plan to increase the dose of one of the medications  I agree with re-checking iron levels as your dysphonia tends to get worse when your iron levels are low  We discussed that you are not interested in resuscitation or feeding tubes, I will make sure to document this in your chart.  Follow up in 6 months time       Iron and ferritin levels were normal in 10/2021 and CBC revealed stable H/H of 12/35.    - Prior history of depression with recent recurrence on last visit: Pt on  Prozac 10 mg daily/re started on our last visit:   she is still having ongoing struggles. She feels nervous all the time.  She wishes to increase dose for this reason. I recommend an increase in dose to 20 mg daily    - history of COPD and pulmonary nodules: pt is part of a lung cancer screening study and she had a recent surveillance chest CT recently which revealed increase in lung nodule size and she is referred to [facility] pulmonary clinic for further evaluation.     I personally spent 40 minutes, face to face, and non face to face, in the care of this patient, which includes pre , intra and post visit time on date of service.  All documented time is specific to EM and dose not include any procedures performed today.      Return in about 3 months (around 04/14/2022).    Subjective:     HPI  The pt is seen today for routine follow up visit.  I last saw the pt in 07/2021 with  the following medical conditions:  -Diabetes mellitus with complications  CMP done in 10/2021 revealed normal serum electrolytes, creatinine 1.0, glucose of 141 and normal LFTs.   DM has been well controlled. Continue metformin 1000 mg BID and diabetic diet.  Repeat HgA1c today is 5.4    -CAD/dyslipidemia: she is followed by [facility] cardiology and last seen in 09/2021 and encounter details reviewed with the pt today.    She continues on Repatha and low dose pravastatin as previously directed.  Her LDL cholesterol was 67 in 16/1096 and LFT's were normal in 10/2021. She will see cardiology in 1 yr.     -left sided Breast cancer history: infiltrating ductal carcinoma, estrogen receptor positive, progesterone receptor positive and HER2 negative s/p lumpectomy in 01/2020 and follow up radiation Rx completed in 02/2020.   She is   Followed annually by breast oncology, seen early this year. She had a normal  mammogram and breast US in 09/2021.    -Dysuria with history of chronic UTIs and microscopic hematuria   She was referred to [facility] urology and she was seen in  04/2021 and advised to follow up in 3 months time.  She was started on macrodantin 100 mg daily for UTI prevention, but stopped due to side effects. She had cystoscopy last year which was normal. She was to start estrace vaginal cream twice a week and  Consider low dose daily keflex instead. She had UTI early this year.  She is started on Keflex 250 mg tab daily for UTI prevention.  She will continue daily D Mannose and cranberry tablet, regimen which has helped as well.       -History of anemia - chronic iron def anemia  The pt is followed by [facility] hematology and last visit was in 10/2021 and  encounter details reviewed with the pt today.  She was advised to re establish care with [facility] GI. She has a history of AVM's with intermittent bleeding.  She has had several IV infusions ( a total of 4 infusions in 19/2022).  Patient had colonoscopy done in 2018 which revealed diverticulosis and 4 colon polyps.  She was advised to repeat colonoscopy in 3 years time.  She had GI bleed in 10/2019 and EGD and colonoscopy were done and pt reports no bleeding source was identified..  Bleeding was attributed to blood thinner which  pt was taking at the time and it was discontinued.  She is on low dose daily enteric aspirin instead, as per cardiology recommendation. CBC done in 10/2021 was normal except for MCV of 98.  Iron and Ferritin were normal along with normal Folate level and low normal Vit B 12 of 264. I recommend starting an OTC Vit b12 supplement at 500 mcg daily.  She was referred to [facility] GI and her appt is in 04/2022 and her sooner appt has been cancelled and rescheduled.  She needs a sooner appt.  She has stool incontinence and he stools are dark.  I offerred a GI referral to [facility] and [facility] and she declined. She is going to wait to go to [facility]. She is advised to go to [facility] ED if she worsens in any way.   She has a hematology appt tomorrow and I recommend repeating a CBC then.       -Hypothyroidism: pt continues on levothyroxine supplementation as directed. Her TSH was normal in 01/2021. She is due for repeat TSH today.    -Hypertension:  Patient remains compliant with her current medication  regimen.  She has been counseled on maintaining low-salt and low-fat dietary intake and exercise routine.    CMP done 10/2021 revealed creatinine level of 1 with normal serum electrolytes and LFT's.    Cervical level spinal myoclonus, Dysphonia spastica and migraine headache history:  She is followed by [facility] neurology and last seen in 11/2021 and encounter details reviewed with the pt today. The following plan was discussed:  Plan  Patient Instructions   No changes to your medications today  If this current worsening continues for more than another 2 weeks, please let me know and we will plan to increase the dose of one of the medications  I agree with re-checking iron levels as your dysphonia tends to get worse when your iron levels are low  We discussed that you are not interested in resuscitation or feeding tubes, I will make sure to document this in your chart.  Follow up in 6 months time       Iron and ferritin levels were normal in 10/2021 and CBC revealed stable H/H of 12/35.    - Prior history of depression with recent recurrence on last visit: Pt on  Prozac 10 mg daily/re started on our last visit:   she is still having ongoing struggles. She feels nervous all the time.  She wishes to increase dose for this reason. I recommend an increase in dose to 20 mg daily    - history of COPD and pulmonary nodules: pt is part of a lung cancer screening study and she had a recent surveillance chest CT recently which revealed increase in lung nodule size and she is referred to [facility] pulmonary clinic for further evaluation.     ROS  Constitutional:  Denies  unexpected weight loss or gain, or weakness   Eyes:  Denies visual changes  Respiratory:  Denies cough or shortness of breath. No change in exercise  tolerance  Cardiovascular:  Denies chest pain, palpitations or lower extremity swelling   GI:  Denies abdominal pain, diarrhea, constipation   Musculoskeletal:  Denies myalgias  Skin:  Denies nonhealing lesions  Neurologic:  Denies headache, focal weakness or numbness, tingling  Endocrine:  Denies polyuria or polydypsia   Psychiatric:  Denies depression, anxiety      Outpatient Medications Prior to Visit   Medication Sig Dispense Refill    aspirin-calcium carbonate 81 mg-300 mg calcium(777 mg) Tab Take 81 mg by mouth.      clonazePAM (KLONOPIN) 0.5 MG tablet Take up to 2 tabs twice daily as directed. 180 tablet 0    d-mannose 500 mg cap Take 1,500 mg by mouth daily. 90 capsule 11    docusate sodium (COLACE) 100 MG capsule Take 1 capsule (100 mg total) by mouth daily as needed.      empty container (SHARPS-A-GATOR DISPOSAL SYSTEM) Misc Use as directed for sharps disposal 1 each 2    evolocumab (REPATHA SURECLICK) 140 mg/mL PnIj Inject the contents of 1 pen (140 mg) under the skin every fourteen (14) days. 2 mL 11    ferrous gluconate 324 MG tablet Take 1 tablet (324 mg total) by mouth once.      levETIRAcetam (KEPPRA) 1000 MG tablet Take 1 tablet (1,000 mg total) by mouth Two (2) times a day. 180 tablet 3    lisinopriL (PRINIVIL,ZESTRIL) 20 MG tablet Take 1 tablet (20 mg total) by mouth daily. 90 tablet 3    metFORMIN (GLUCOPHAGE) 500 MG tablet Take 2 tablets (1,000 mg total) by mouth  in the morning and 2 tablets (1,000 mg total) in the evening. Take with meals. 360 tablet 1    omeprazole 20 mg TbLD Take by mouth nightly as needed.      pravastatin (PRAVACHOL) 10 MG tablet Take 0.5 tablets (5 mg total) by mouth daily. 45 tablet 3    SYNTHROID 100 mcg tablet Take 1 tablet (100 mcg total) by mouth daily. 90 tablet 3    FLUoxetine (PROZAC) 10 MG capsule TAKE 1 CAPSULE BY MOUTH ONCE DAILY 30 capsule 2     No facility-administered medications prior to visit.         Objective:       Vital Signs  BP 142/76  - Pulse 84  - Temp 36.5 ??C (97.7 ??F)  - Ht 167.6 cm (5' 6)  - Wt 85.3 kg (188 lb)  - SpO2 96%  - BMI 30.34 kg/m??      Exam  General: normal appearance  EYES: Anicteric sclerae.  ENT: Oropharynx moist.  RESP: Relaxed respiratory effort. Clear to auscultation without wheezes or crackles.   CV: Regular rate and rhythm. Normal S1 and S2. No murmurs or gallops.  No lower extremity edema. Posterior tibial pulses are 2+ and symmetric.  abd exam: non tender, no masses, no HSM   MSK: No focal muscle tenderness.  SKIN: Appropriately warm and moist.  NEURO: Stable gait and coordination.    Allergies:     Patient has no known allergies.    Current Medications:     Current Outpatient Medications   Medication Sig Dispense Refill    aspirin-calcium carbonate 81 mg-300 mg calcium(777 mg) Tab Take 81 mg by mouth.      clonazePAM (KLONOPIN) 0.5 MG tablet Take up to 2 tabs twice daily as directed. 180 tablet 0    d-mannose 500 mg cap Take 1,500 mg by mouth daily. 90 capsule 11    docusate sodium (COLACE) 100 MG capsule Take 1 capsule (100 mg total) by mouth daily as needed.      empty container (SHARPS-A-GATOR DISPOSAL SYSTEM) Misc Use as directed for sharps disposal 1 each 2    evolocumab (REPATHA SURECLICK) 140 mg/mL PnIj Inject the contents of 1 pen (140 mg) under the skin every fourteen (14) days. 2 mL 11    ferrous gluconate 324 MG tablet Take 1 tablet (324 mg total) by mouth once.      levETIRAcetam (KEPPRA) 1000 MG tablet Take 1 tablet (1,000 mg total) by mouth Two (2) times a day. 180 tablet 3    lisinopriL (PRINIVIL,ZESTRIL) 20 MG tablet Take 1 tablet (20 mg total) by mouth daily. 90 tablet 3    metFORMIN (GLUCOPHAGE) 500 MG tablet Take 2 tablets (1,000 mg total) by mouth in the morning and 2 tablets (1,000 mg total) in the evening. Take with meals. 360 tablet 1    omeprazole 20 mg TbLD Take by mouth nightly as needed.      pravastatin (PRAVACHOL) 10 MG tablet Take 0.5 tablets (5 mg total) by mouth daily. 45 tablet 3    SYNTHROID 100 mcg tablet Take 1 tablet (100 mcg total) by mouth daily. 90 tablet 3    cephalexin (KEFLEX) 250 MG capsule Take 1 capsule daily for UTI prevention 30 capsule 11    FLUoxetine (PROZAC) 20 MG capsule Take 1 capsule (20 mg total) by mouth daily. 90 capsule 3     No current facility-administered medications for this visit.  Note - This record has been created using [employer]. Chart creation errors have been sought, but may not always have been located. Such creation errors do not reflect on the standard of medical care.    [name], MD

## 2022-01-05 MED ORDER — FLUOXETINE 10 MG CAPSULE
ORAL_CAPSULE | 2 refills | 0 days | Status: CP
Start: 2022-01-05 — End: ?

## 2022-01-12 ENCOUNTER — Ambulatory Visit: Admit: 2022-01-12 | Discharge: 2022-01-13 | Payer: PRIVATE HEALTH INSURANCE

## 2022-01-12 DIAGNOSIS — Z170 Estrogen receptor positive status [ER+]: Principal | ICD-10-CM

## 2022-01-12 DIAGNOSIS — J449 Chronic obstructive pulmonary disease, unspecified: Principal | ICD-10-CM

## 2022-01-12 DIAGNOSIS — I10 Essential (primary) hypertension: Principal | ICD-10-CM

## 2022-01-12 DIAGNOSIS — E039 Hypothyroidism, unspecified: Principal | ICD-10-CM

## 2022-01-12 DIAGNOSIS — N390 Urinary tract infection, site not specified: Principal | ICD-10-CM

## 2022-01-12 DIAGNOSIS — J383 Other diseases of vocal cords: Principal | ICD-10-CM

## 2022-01-12 DIAGNOSIS — R350 Frequency of micturition: Principal | ICD-10-CM

## 2022-01-12 DIAGNOSIS — E119 Type 2 diabetes mellitus without complications: Principal | ICD-10-CM

## 2022-01-12 DIAGNOSIS — Z862 Personal history of diseases of the blood and blood-forming organs and certain disorders involving the immune mechanism: Principal | ICD-10-CM

## 2022-01-12 DIAGNOSIS — E782 Mixed hyperlipidemia: Principal | ICD-10-CM

## 2022-01-12 DIAGNOSIS — I2510 Atherosclerotic heart disease of native coronary artery without angina pectoris: Principal | ICD-10-CM

## 2022-01-12 DIAGNOSIS — C50312 Malignant neoplasm of lower-inner quadrant of left female breast: Principal | ICD-10-CM

## 2022-01-12 DIAGNOSIS — F32A Depression, unspecified depression type: Principal | ICD-10-CM

## 2022-01-12 DIAGNOSIS — R918 Other nonspecific abnormal finding of lung field: Principal | ICD-10-CM

## 2022-01-12 LAB — TSH: THYROID STIMULATING HORMONE: 1.171 u[IU]/mL (ref 0.550–4.780)

## 2022-01-12 MED ORDER — FLUOXETINE 20 MG CAPSULE
ORAL_CAPSULE | Freq: Every day | ORAL | 3 refills | 90 days | Status: CP
Start: 2022-01-12 — End: 2023-01-12

## 2022-01-12 MED ORDER — CEPHALEXIN 250 MG CAPSULE
ORAL_CAPSULE | 11 refills | 0 days | Status: CP
Start: 2022-01-12 — End: ?

## 2022-01-12 NOTE — Unmapped (Signed)
Normal TSH

## 2022-01-13 ENCOUNTER — Ambulatory Visit
Admit: 2022-01-13 | Discharge: 2022-01-13 | Payer: PRIVATE HEALTH INSURANCE | Attending: Nurse Practitioner | Primary: Nurse Practitioner

## 2022-01-13 ENCOUNTER — Ambulatory Visit: Admit: 2022-01-13 | Discharge: 2022-01-13 | Payer: PRIVATE HEALTH INSURANCE

## 2022-01-13 DIAGNOSIS — D500 Iron deficiency anemia secondary to blood loss (chronic): Principal | ICD-10-CM

## 2022-01-13 DIAGNOSIS — R918 Other nonspecific abnormal finding of lung field: Principal | ICD-10-CM

## 2022-01-13 DIAGNOSIS — E538 Deficiency of other specified B group vitamins: Principal | ICD-10-CM

## 2022-01-13 LAB — FERRITIN: FERRITIN: 51.9 ng/mL

## 2022-01-13 LAB — RETICULOCYTES
RETICULOCYTE ABSOLUTE COUNT: 69.1 10*9/L (ref 23.0–100.0)
RETICULOCYTE COUNT PCT: 2.07 % (ref 0.50–2.17)

## 2022-01-13 LAB — CBC W/ AUTO DIFF
BASOPHILS ABSOLUTE COUNT: 0.1 10*9/L (ref 0.0–0.1)
BASOPHILS RELATIVE PERCENT: 2.2 %
EOSINOPHILS ABSOLUTE COUNT: 0.4 10*9/L (ref 0.0–0.5)
EOSINOPHILS RELATIVE PERCENT: 8.2 %
HEMATOCRIT: 33.7 % — ABNORMAL LOW (ref 34.0–44.0)
HEMOGLOBIN: 11.4 g/dL (ref 11.3–14.9)
LYMPHOCYTES ABSOLUTE COUNT: 1.2 10*9/L (ref 1.1–3.6)
LYMPHOCYTES RELATIVE PERCENT: 26.1 %
MEAN CORPUSCULAR HEMOGLOBIN CONC: 33.8 g/dL (ref 32.0–36.0)
MEAN CORPUSCULAR HEMOGLOBIN: 34.1 pg — ABNORMAL HIGH (ref 25.9–32.4)
MEAN CORPUSCULAR VOLUME: 100.9 fL — ABNORMAL HIGH (ref 77.6–95.7)
MEAN PLATELET VOLUME: 7.7 fL (ref 6.8–10.7)
MONOCYTES ABSOLUTE COUNT: 0.4 10*9/L (ref 0.3–0.8)
MONOCYTES RELATIVE PERCENT: 8.8 %
NEUTROPHILS ABSOLUTE COUNT: 2.5 10*9/L (ref 1.8–7.8)
NEUTROPHILS RELATIVE PERCENT: 54.7 %
PLATELET COUNT: 228 10*9/L (ref 150–450)
RED BLOOD CELL COUNT: 3.34 10*12/L — ABNORMAL LOW (ref 3.95–5.13)
RED CELL DISTRIBUTION WIDTH: 13.6 % (ref 12.2–15.2)
WBC ADJUSTED: 4.6 10*9/L (ref 3.6–11.2)

## 2022-01-13 LAB — IRON PANEL
IRON SATURATION: 31 % (ref 20–55)
IRON: 79 ug/dL
TOTAL IRON BINDING CAPACITY: 254 ug/dL (ref 250–425)

## 2022-01-13 LAB — C-REACTIVE PROTEIN: C-REACTIVE PROTEIN: <4 mg/L (ref <=10.0)

## 2022-01-13 LAB — SEDIMENTATION RATE: ERYTHROCYTE SEDIMENTATION RATE: 19 mm/h (ref 0–30)

## 2022-01-13 NOTE — Unmapped (Signed)
Hematology Consult Note    Referring Physician :  [name], MD    Primary Care Physician:   [name], MD - [facility] [place]    Reason for Consult:  Anemia    Assessment/Recommendations:  Amber Duncan is a 71 y.o. Caucasian female with a history of iron deficiency anemia, dysphonia spastica, DMT2 c/b gastroparesis, GERD, COPD, fatigue, hypothyroidism, HTN, CAD, chronic UTI, hyperlipidemia, and L breast cancer who we are seeing in consultation at the request of [name] for further evaluation of anemia.    1. Iron deficiency without anemia:  Available labs date back to 2013. First noted to be anemic in 2015; she has received IV iron at [facility] since 2017. Most likely cause of iron deficiency is occult blood loss from AVMs and possibly GERD. Recently received 4 infusions of Ferrlecit (1000 mg total dose), last dose on 07/28/21, all tolerated well. She has a GI new patient appt scheduled in August, is currently on the wait list for an earlier appt. Symptomatically, she remains fatigued, still having black stools, abd pain. Doesn't know when she last felt well. Labs from today show Hgb 11.4, MCV 100.9, normal sed rate, CRP, and retics. Iron sat 31%, ferritin 52. While ferritin is >30, it is possible, given her chronic diseases she will feel better if ferritin goal is >100. She should continue taking PO iron as tolerated. Will order Ferrlecit 500 mg IV to be given over 2 infusions. RTC in 6 months for follow up labs. All questions were answered and the patient was in agreement with this plan.    2. Vitamin B-12 deficiency: On 10/13/21 Vitamin B-12 274. PCP recommended she start PO B-12 supplements daily; I agree with this plan. MCV 100.9 today. If macrocytosis remains at next visit, consider MDS panel. Will plan to recheck B-12 level at next visit as well.    Plan:  - Labs collected today: CBC, Retic, Iron Studies: Iron, Iron Sat, Transferrin, TIBC, Ferritin and Sed Rate, CRP  - Order 500 mg Ferrlecit IV iron to be given over 2 infusions  - Continue OTC PO iron supplements as tolerated. Take 1 tab every day, with Vitamin C. Do not take with dairy or caffeinated products.  - Continue PO B-12 supplements   - F/u with GI in August as planned  - Return in 6 months for repeat labs and assessment.    History of Present Illness:   Available labs date back to 2013. First noted to be anemic 2015, with intermittent anemia since that time with Hgb 9-14 (generally normocytic). Since 2017 she has been receiving Venofer injections intermittently via [facility] and taking PO iron daily. She has some constipation requiring suppositories, but otherwise tolerates the iron ok.    She was seen by [facility] GI in 2017. Capsule endoscopy showed AVM in small bowel with subsequent EGD and tx of that AVM. In 10/2019 she had an episode of GIB with Hgb 7.5. Colonoscopy and EGD at that time again showed bleeding AVMs, thought to be exacerbated by anticoagulation after cardiac stent placement. Multiple AVMs were cauterized, she received 1 unit PRBCs and IV iron infusions. Has had intermittent iron deficiency anemia since that time (Hgb 9-12). Most recent labs on 05/15/21 show Hgb 12.1, MCV 96.5, plt 288, WBC 7, ferritin 28.7, iron 85, iron sat 24. Sed rate, CRP n/a. B12 and folate have been normal historically. She is no longer on anticoagulation, does take 1 baby ASA daily.  She is post-menopausal, had a hysterectomy at 75, has had no vaginal bleeding since that time.She eats a varied diet that includes: chicken or tuna 3-5x/week, beans, leafy green vegetables. Does not have s/sx of celiac disease or inflammatory bowel disease, and does not have a hx of bariatric surgery. She is not a long distance runner or regular blood donor. She does have Hx of hepatitis, reportedly treated.    She reports ongoing acid reflux and mucousy yellow stools which she associates with chronic abx for UTI. When bowels do move they are dark, which she associates with the iron. Otherwise denies known GI bleeding (melena, hematochezia).  She denies history of bleeding complications with prior surgery, excessive mucosal bleeding or abnormal bruising.  Her energy level and concentration has been poor.  She denies dietary restrictions. She admits hair loss or brittle nails. She admits pica (ice, pretzels, popcorn). She admits shortness of breath with exertion or at rest, dizziness, syncope, chest pain. She denies RLS.    IV iron treatment history [facility]):  - Feb 2017 - Aug 2017 = 1400 mg Venofer [phone])  - Feb 2018 - Mar 2018 = 600 mg Venofer [phone])  - Apr 2021 - Sep 2021 = 1800 mg Venofer (200x9)    IV iron treatment history [facility])  - Oct 2022 - Nov 2022 = 1000 mg Ferrlecit (250x4)    Interval Hx:  Since her last visit on in 10/2021, she remains fatigued and weak, and is still having black stools, lower abd pain. Doesn't know when she last felt well. She has a new patient GI appt scheduled in August. She is taking omeprazole PRN at night. Still having hair loss and brittle nails. Ice cravings resolved after iron infusion, but continues to crave popcorn. Taking PO iron daily and tolerating it well.    Visit Diagnoses:  1. Iron deficiency anemia due to chronic blood loss    2. Vitamin B12 deficiency without anemia          Medical History:  Past Medical History:   Diagnosis Date    Breast cancer (CMS-HCC)     Chronic UTI     COPD (chronic obstructive pulmonary disease) (CMS-HCC)     Coronary artery disease     Diabetes (CMS-HCC)     Disease of thyroid gland     Fatigue     Hepatitis     Hepatitis     Hyperlipidemia     Hypertension     Migraine     Sleep apnea        Surgical History:  Past Surgical History:   Procedure Laterality Date    BACK SURGERY      BREAST BIOPSY      BREAST LUMPECTOMY Left 01/2020    CATARACT EXTRACTION      CHOLECYSTECTOMY      CORONARY STENT PLACEMENT      HYSTERECTOMY      KNEE SURGERY      PR COLONOSCOPY,ABLATE LESION N/A 01/21/2017    Procedure: COLONOSCOPY FLEX; W/ABLAT LES NOT AMENABLE-SNARE;  Surgeon: Luanne Bras, MD;  [facility];  Service: Gastroenterology    PR COLSC FLX W/RMVL OF TUMOR POLYP LESION SNARE TQ N/A 01/21/2017    Procedure: COLONOSCOPY FLEX; W/REMOV TUMOR/LES BY SNARE;  Surgeon: Luanne Bras, MD;  [facility]: [facility];  Service: Gastroenterology    PR MASTECTOMY, PARTIAL Left 01/19/2020    Procedure: MASTECTOMY, PARTIAL (EG, LUMPECTOMY, TYLECTOMY, QUADRANTECTOMY, SEGMENTECTOMY);  Surgeon:  Talbert Cage, DO;  Location: MAIN OR [facility];  Service: Surgical Oncology Breast    PR UPPER GI ENDOSCOPY,BIOPSY N/A 04/29/2018    Procedure: UGI ENDOSCOPY; WITH BIOPSY, SINGLE OR MULTIPLE;  Surgeon: Rona Ravens, MD;  [facility];  Service: Gastroenterology    RADIATION Left     REDUCTION MAMMAPLASTY      REPLACEMENT TOTAL KNEE Right        No history of bleeding complications with past surgeries    Social History:  Social History     Socioeconomic History    Marital status: Divorced   Tobacco Use    Smoking status: Former     Packs/day: 2.00     Years: 48.00     Pack years: 96.00     Types: Cigarettes     Quit date: 11/06/2011     Years since quitting: 10.1     Passive exposure: Past    Smokeless tobacco: Never   Vaping Use    Vaping Use: Never used   Substance and Sexual Activity    Alcohol use: No    Drug use: No   Social History Narrative    She lives in [place]. 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 She denies alcohol and illicit drug use.       Social Determinants of Health     Transportation Needs: No Transportation Needs    Lack of Transportation (Medical): No    Lack of Transportation (Non-Medical): No       Amber Duncan was born in [place] and currently lives in [place], alone. Retired, worked as a Copy at [employer], and in Designer, fashion/clothing. Still occasionally cleans houses. Hobbies include seeing live music, going to the beach, going out to eat.     Tobacco history: Former, quit 9 years ago. Smoked for 45 years prior to that.  Alcohol history: Denies  Illicit drug history: Denies    Family History:  family history includes Cancer in her brother, brother, maternal aunt, and maternal uncle; Diabetes in her paternal uncle; No Known Problems in her brother, daughter, maternal grandfather, maternal grandmother, paternal grandfather, paternal grandmother, sister, son, and another family member; Other in her father, mother, sister, and sister.     Sister with iron deficiency anemia. No family hx of bleeding or clotting disorders    Allergies:  Patient has no known allergies.    Medications:   Current Outpatient Medications   Medication Sig Dispense Refill    aspirin-calcium carbonate 81 mg-300 mg calcium(777 mg) Tab Take 81 mg by mouth.      cephalexin (KEFLEX) 250 MG capsule Take 1 capsule daily for UTI prevention 30 capsule 11    clonazePAM (KLONOPIN) 0.5 MG tablet Take up to 2 tabs twice daily as directed. 180 tablet 0    d-mannose 500 mg cap Take 1,500 mg by mouth daily. 90 capsule 11    docusate sodium (COLACE) 100 MG capsule Take 1 capsule (100 mg total) by mouth daily as needed.      empty container (SHARPS-A-GATOR DISPOSAL SYSTEM) Misc Use as directed for sharps disposal 1 each 2    evolocumab (REPATHA SURECLICK) 140 mg/mL PnIj Inject the contents of 1 pen (140 mg) under the skin every fourteen (14) days. 2 mL 11    ferrous gluconate 324 MG tablet Take 1 tablet (324 mg total) by mouth once.      FLUoxetine (PROZAC) 20 MG capsule  Take 1 capsule (20 mg total) by mouth daily. 90 capsule 3    levETIRAcetam (KEPPRA) 1000 MG tablet Take 1 tablet (1,000 mg total) by mouth Two (2) times a day. 180 tablet 3    lisinopriL (PRINIVIL,ZESTRIL) 20 MG tablet Take 1 tablet (20 mg total) by mouth daily. 90 tablet 3    metFORMIN (GLUCOPHAGE) 500 MG tablet Take 2 tablets (1,000 mg total) by mouth in the morning and 2 tablets (1,000 mg total) in the evening. Take with meals. 360 tablet 1    omeprazole 20 mg TbLD Take by mouth nightly as needed.      pravastatin (PRAVACHOL) 10 MG tablet Take 0.5 tablets (5 mg total) by mouth daily. 45 tablet 3    SYNTHROID 100 mcg tablet Take 1 tablet (100 mcg total) by mouth daily. 90 tablet 3     No current facility-administered medications for this visit.       Review of Systems:  As per HPI, otherwise negative x 12 systems.    Objective :  There were no vitals filed for this visit.      Physical Exam:  GEN: Well-appearing female in no acute distress.  HEENT: Normocephalic, no conjunctival injection, sclera anicteric.  RESP: Breathing unlabored  NEURO: A&Ox3, CNII-XII grossly intact.  PSY: Appropriate affect, reasonable insight and judgment.    Test Results  Lab Results   Component Value Date    WBC 7.2 10/13/2021    HGB 12.2 10/13/2021    HCT 35.3 10/13/2021    PLT 266 10/13/2021          Lab Results   Component Value Date    IRON 73 10/13/2021    TIBC 271 10/13/2021    FERRITIN 103.9 10/13/2021        Results for orders placed or performed in visit on 01/12/22   TSH   Result Value Ref Range    TSH 1.171 0.550 - 4.780 uIU/mL   POCT glycosylated hemoglobin (Hb A1C)   Result Value Ref Range    Hemoglobin A1C 5.4 4.0 - 6.0 %    A1C LOT NBR .     A1C STRIP EXP .        Glendell Docker, NP  [facility]

## 2022-01-13 NOTE — Unmapped (Signed)
-   We will check labs today. Depending on those labs, we may need to do another IV iron infusion (either Ferrlecit like you have gotten in the past, or iron dextran, which can be given in 1 infusion).   - If you are iron deficient again, I will order an upper endoscopy (EGD) and colonoscopy to further evaluate for GI bleeding and sources of blood loss. Please see GI in August as planned.

## 2022-01-15 NOTE — Unmapped (Signed)
Negative retinopathy noted bilaterally

## 2022-01-15 NOTE — Unmapped (Signed)
[facility] Refill Coordination Note    Specialty Medication(s) to be Shipped:   General Specialty: Repatha    Other medication(s) to be shipped: No additional medications requested for fill at this time     Amber Duncan, DOB: Mar 11, 1951  Phone: [phone] (home)       All above HIPAA information was verified with patient.     Was a Nurse, learning disability used for this call? No    Completed refill call assessment today to schedule patient's medication shipment from the [facility] [phone]).  All relevant notes have been reviewed.     Specialty medication(s) and dose(s) confirmed: Regimen is correct and unchanged.   Changes to medications: Amber Duncan reports no changes at this time.  Changes to insurance: No  New side effects reported not previously addressed with a pharmacist or physician: None reported  Questions for the pharmacist: No    Confirmed patient received a Conservation officer, historic buildings and a Surveyor, mining with first shipment. The patient will receive a drug information handout for each medication shipped and additional FDA Medication Guides as required.       DISEASE/MEDICATION-SPECIFIC INFORMATION        For patients on injectable medications: Patient currently has 0 doses left.  Next injection is scheduled for 01/22/22.    SPECIALTY MEDICATION ADHERENCE     Medication Adherence    Patient reported X missed doses in the last month: 0  Specialty Medication: Repatha 140mg /ml  Patient is on additional specialty medications: No  Patient is on more than two specialty medications: No              Were doses missed due to medication being on hold? No    Repatha 140 mg/ml: 0 days of medicine on hand     REFERRAL TO PHARMACIST     Referral to the pharmacist: Not needed      [facility]     Shipping address confirmed in Epic.     Delivery Scheduled: Yes, Expected medication delivery date: 01/19/22.     Medication will be delivered via Same Day Courier to the prescription address in Epic WAM.    Amber Duncan [facility] Specialty Technician

## 2022-01-19 MED FILL — REPATHA SURECLICK 140 MG/ML SUBCUTANEOUS PEN INJECTOR: SUBCUTANEOUS | 28 days supply | Qty: 2 | Fill #5

## 2022-01-20 ENCOUNTER — Ambulatory Visit: Admit: 2022-01-20 | Discharge: 2022-01-21 | Payer: PRIVATE HEALTH INSURANCE

## 2022-01-20 DIAGNOSIS — J849 Interstitial pulmonary disease, unspecified: Principal | ICD-10-CM

## 2022-01-20 DIAGNOSIS — J449 Chronic obstructive pulmonary disease, unspecified: Principal | ICD-10-CM

## 2022-01-20 DIAGNOSIS — R918 Other nonspecific abnormal finding of lung field: Principal | ICD-10-CM

## 2022-01-20 DIAGNOSIS — R0602 Shortness of breath: Principal | ICD-10-CM

## 2022-01-20 LAB — RHEUMATOID FACTOR, QUANT: RHEUMATOID FACTOR: <3.5 [IU]/mL (ref <14.0)

## 2022-01-20 LAB — C-REACTIVE PROTEIN: C-REACTIVE PROTEIN: <4 mg/L (ref <=10.0)

## 2022-01-20 LAB — SEDIMENTATION RATE: ERYTHROCYTE SEDIMENTATION RATE: 29 mm/h (ref 0–30)

## 2022-01-20 NOTE — Unmapped (Signed)
Thank you for allowing us to participate in your care. Please do not hesitate to contact us with issues or questions.     [facility]  Tiana LoftJason Akulian, M.D.  Catalina Gravelhristina MacRosty D.O.  Roby LoftsA. Cole Burks, M.D.    Fellow: Hulen Lusterorib Justice Aguirre, M.D.    During business hours please contact Tammy Allred at [phone] or e-mail her at [email or web address] for medical questions or Darrel Reachshley Ray at [phone] for procedure scheduling questions or schedule changes.  The nursing triage line is [phone].    Sign up for MyChart to see your results, appointments and communicate directly with your doctors at [email or web address]    For urgent issues after hours please call [phone] [facility]) and ask to speak with the Interventional Pulmonary Fellow on call.

## 2022-01-20 NOTE — Unmapped (Signed)
INTERVENTIONAL PULMONARY CLINIC NEW PATIENT EVALUATION    Assessment:     Patient:Amber Duncan (02-23-1951)  Reason for visit: Amber Duncan is a 71 y.o.female who is being seen in consultation at the request of Dr. Vinson Moselle for lung nodules  - Chest CT 12/2021 showing right upper lobe nodule at 9 mm compared to chest CT in 2018 that was 8 mm  - Normal PFTs in 2019, now complaining of worsening dyspnea with worsening parenchymal lung disease  - Previous smoker of 90 pack years, quit 10 years ago    Plan:   - Will repeat her lung function test in light of worsening symptoms and parenchymal changes in her most recent chest CT compared to her chest CT done in 2019  - The lesion has been somewhat stable, compared to the chest CT in 2019 with what shows that its around 8 mm, to now around 9 mm, so growth of 1 mm change, so will present her case in lung nodule conference to corroborate change   - Will hold off PET as size of nodule maybe below threshold of PET and therefore high likelihood of false negative test  - Will refer for interstitial lung disease, concerned for pulmonary fibrosis in light of worsening subpleural reticulation, no obvious honeycombing, or bronchiectasis, will get preliminary serology, ie ANA/CCP/CRP/ESR  - Obtain echo to evaluate cardiac function in light of symptoms and see if there is any cardiac-pulmonary related dysfunction    Patient seen and examined with Dr. Maple Mirza    Hulen Luster, MD  Interventional Pulmonology Fellow  Division of Pulmonary Diseases and Critical Care Medicine  Pager: [phone]    History:     Referring Physician:   [name], MD  [address]  [place],  [place] [address]    Reason for Visit: Lung nodules    HPI:   Amber Duncan is a 71 year old female with a history of 45 years of 2 pack per day smoking, quit 10 years ago, has been undergoing LDCT for the last 5 years, presents to the interventional pulmonology clinic after the most recent chest CT showed lung nodules. She has a history of left sided breast cancer which she underwent lumpectomy 2 years ago, followed by 20 rounds of radiation, no chemotherapy. Patient endorses reflux.   She states her breathing is difficult because of tightness in her abdomen and chest that she relates to her dystonia/movement disorder. She does follow up with neurology where she is receiving Keppra and clonipine.  She endorses cough, mostly post prandial.   She states she's extremely tired and fatigued that getting up from chair causes dyspnea. She has been getting iron infusion due to anemia, pending GI work up. She has a history of AVM in the intestine which she underwent endoscopic ablation in the 90's. She has a history of coronary artery disease status post PCI with stent (2) with most recent being 2021, and before that 2008.  Her brother was diagnosed with stage IV lung cancer.    Past Medical History:   Diagnosis Date   ??? Breast cancer (CMS-HCC)    ??? Chronic UTI    ??? COPD (chronic obstructive pulmonary disease) (CMS-HCC)    ??? Coronary artery disease    ??? Diabetes (CMS-HCC)    ??? Disease of thyroid gland    ??? Fatigue    ??? Hepatitis    ??? Hepatitis    ??? Hyperlipidemia    ??? Hypertension    ??? Migraine    ???  Sleep apnea        Past Surgical History:   Procedure Laterality Date   ??? BACK SURGERY     ??? BREAST BIOPSY     ??? BREAST LUMPECTOMY Left 01/2020   ??? CATARACT EXTRACTION     ??? CHOLECYSTECTOMY     ??? CORONARY STENT PLACEMENT     ??? HYSTERECTOMY     ??? KNEE SURGERY     ??? PR COLONOSCOPY,ABLATE LESION N/A 01/21/2017    Procedure: COLONOSCOPY FLEX; W/ABLAT LES NOT AMENABLE-SNARE;  Surgeon: Luanne Bras, MD;  Location: [facility];  Service: Gastroenterology   ??? PR COLSC FLX W/RMVL OF TUMOR POLYP LESION SNARE TQ N/A 01/21/2017    Procedure: COLONOSCOPY FLEX; W/REMOV TUMOR/LES BY SNARE;  Surgeon: Luanne Bras, MD;  Location: [facility];  Service: Gastroenterology   ??? PR MASTECTOMY, PARTIAL Left 01/19/2020    Procedure: MASTECTOMY, PARTIAL (EG, LUMPECTOMY, TYLECTOMY, QUADRANTECTOMY, SEGMENTECTOMY);  Surgeon: Talbert Cage, DO;  Location: MAIN OR [facility];  Service: Surgical Oncology Breast   ??? PR UPPER GI ENDOSCOPY,BIOPSY N/A 04/29/2018    Procedure: UGI ENDOSCOPY; WITH BIOPSY, SINGLE OR MULTIPLE;  Surgeon: Rona Ravens, MD;  [facility];  Service: Gastroenterology   ??? RADIATION Left    ??? REDUCTION MAMMAPLASTY     ??? REPLACEMENT TOTAL KNEE Right        Current Outpatient Medications   Medication Sig Dispense Refill   ??? aspirin-calcium carbonate 81 mg-300 mg calcium(777 mg) Tab Take 81 mg by mouth.     ??? cephalexin (KEFLEX) 250 MG capsule Take 1 capsule daily for UTI prevention 30 capsule 11   ??? clonazePAM (KLONOPIN) 0.5 MG tablet Take up to 2 tabs twice daily as directed. 180 tablet 0   ??? d-mannose 500 mg cap Take 1,500 mg by mouth daily. 90 capsule 11   ??? docusate sodium (COLACE) 100 MG capsule Take 1 capsule (100 mg total) by mouth daily as needed.     ??? empty container (SHARPS-A-GATOR DISPOSAL SYSTEM) Misc Use as directed for sharps disposal 1 each 2   ??? evolocumab (REPATHA SURECLICK) 140 mg/mL PnIj Inject the contents of 1 pen (140 mg) under the skin every fourteen (14) days. 2 mL 11   ??? ferrous gluconate 324 MG tablet Take 1 tablet (324 mg total) by mouth once.     ??? FLUoxetine (PROZAC) 20 MG capsule Take 1 capsule (20 mg total) by mouth daily. 90 capsule 3   ??? levETIRAcetam (KEPPRA) 1000 MG tablet Take 1 tablet (1,000 mg total) by mouth Two (2) times a day. 180 tablet 3   ??? lisinopriL (PRINIVIL,ZESTRIL) 20 MG tablet Take 1 tablet (20 mg total) by mouth daily. 90 tablet 3   ??? metFORMIN (GLUCOPHAGE) 500 MG tablet Take 2 tablets (1,000 mg total) by mouth in the morning and 2 tablets (1,000 mg total) in the evening. Take with meals. 360 tablet 1   ??? omeprazole 20 mg TbLD Take by mouth nightly as needed.     ??? pravastatin (PRAVACHOL) 10 MG tablet Take 0.5 tablets (5 mg total) by mouth daily. 45 tablet 3   ??? SYNTHROID 100 mcg tablet Take 1 tablet (100 mcg total) by mouth daily. 90 tablet 3     No current facility-administered medications for this visit.       Allergies as of 01/20/2022   ??? (No Known Allergies)       Family History   Problem Relation  Age of Onset   ??? Cancer Brother         liver   ??? Cancer Maternal Aunt    ??? Cancer Maternal Uncle    ??? Diabetes Paternal Uncle    ??? Other Mother         MVA   ??? Other Father         MVA   ??? No Known Problems Sister    ??? No Known Problems Son    ??? Cancer Brother         Lung   ??? Other Sister         MVA   ??? Other Sister         MVA drunk driver   ??? No Known Problems Brother    ??? No Known Problems Daughter    ??? No Known Problems Maternal Grandmother    ??? No Known Problems Maternal Grandfather    ??? No Known Problems Paternal Grandmother    ??? No Known Problems Paternal Grandfather    ??? No Known Problems Other    ??? Heart failure Neg Hx    ??? BRCA 1/2 Neg Hx    ??? Breast cancer Neg Hx    ??? Colon cancer Neg Hx    ??? Endometrial cancer Neg Hx    ??? Ovarian cancer Neg Hx        Social History     Socioeconomic History   ??? Marital status: Divorced   Tobacco Use   ??? Smoking status: Former     Packs/day: 2.00     Years: 48.00     Pack years: 96.00     Types: Cigarettes     Quit date: 11/06/2011     Years since quitting: 10.2     Passive exposure: Past   ??? Smokeless tobacco: Never   Vaping Use   ??? Vaping Use: Never used   Substance and Sexual Activity   ??? Alcohol use: No   ??? Drug use: No   Social History Narrative    She lives in [place]. 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 She denies alcohol and illicit drug use.       Social Determinants of Health     Transportation Needs: No Transportation Needs   ??? Lack of Transportation (Medical): No   ??? Lack of Transportation (Non-Medical): No     Review of Systems  A 12 point review of systems was negative except for pertinent items noted in the HPI.    Objective:     Physical Examination:   General appearance - alert, well appearing, and in no distress  Vitals:   Vitals:    01/20/22 0841   BP: 175/88   Pulse: 79   Resp: 16   Temp: 37.1 ??C (98.8 ??F)   SpO2: 99%     Eyes - Sclera anicteric, conjunctiva pink  Mouth - mucous membranes moist, pharynx normal without lesions  Neck - Trachea supple and midline, (-) JVD  Lymphatics - no palpable lymphadenopathy  Heart - normal rate, regular rhythm, normal S1, S2, no murmurs, rubs, clicks or gallops  Chest - clear to auscultation, no wheezes, rales or rhonchi, symmetric air entry  Abdomen - soft, nontender, nondistended, no masses or organomegaly  Extremities - No pedal edema, no clubbing or cyanosis  Skin - normal coloration and turgor, no rashes, no suspicious skin lesions noted  Neurological - alert, oriented, normal  speech, no focal findings or movement disorder noted    Labs and Imaging:   All pertinent labs and images were reviewed

## 2022-01-20 NOTE — Unmapped (Signed)
Blood drawn using (23) ga butterfly needle in right AC (1) successful attempt. Pt tolerated without difficulty. Blood work tubed to lab.

## 2022-01-21 LAB — EXTRACTABLE NUCLEAR ANTIGEN: ENA SCREEN: 0.1 ENA Units (ref <0.70)

## 2022-01-21 LAB — CYCLIC CITRUL PEPTIDE ANTIBODY, IGG
CCP ANTIBODIES: 1.7 {ELISA'U} (ref <7.0)
CCP IGG ANTIBODIES: NEGATIVE

## 2022-01-21 NOTE — Unmapped (Signed)
Called pt to begin process of scheduling Echo, transferred the pt to the correct location to begin scheduling    -Rashad B.

## 2022-01-22 LAB — ALDOLASE: ALDOLASE: 3.3 U/L

## 2022-01-23 ENCOUNTER — Ambulatory Visit: Admit: 2022-01-23 | Discharge: 2022-01-24 | Payer: PRIVATE HEALTH INSURANCE

## 2022-01-24 LAB — ANA
ANA TITER 1: 1:80 {titer}
ANTINUCLEAR ANTIBODIES (ANA): POSITIVE — AB

## 2022-01-26 ENCOUNTER — Ambulatory Visit: Admit: 2022-01-26 | Discharge: 2022-01-27 | Payer: PRIVATE HEALTH INSURANCE

## 2022-01-26 DIAGNOSIS — R911 Solitary pulmonary nodule: Principal | ICD-10-CM

## 2022-01-27 ENCOUNTER — Ambulatory Visit: Admit: 2022-01-27 | Discharge: 2022-01-28 | Payer: PRIVATE HEALTH INSURANCE

## 2022-01-27 MED ADMIN — sodium ferric gluconate (FERRLECIT) 250 mg in sodium chloride (NS) 0.9 % 100 mL IVPB: 250 mg | INTRAVENOUS | @ 18:00:00 | Stop: 2022-01-27

## 2022-01-27 MED ADMIN — cetirizine (ZyrTEC) tablet 10 mg: 10 mg | ORAL | @ 17:00:00 | Stop: 2022-01-27

## 2022-01-27 MED ADMIN — acetaminophen (TYLENOL) tablet 650 mg: 650 mg | ORAL | @ 17:00:00 | Stop: 2022-01-27

## 2022-01-27 NOTE — Unmapped (Signed)
I called [name] to discuss the conclusion of multidisciplinary lung nodule conference regarding the 9 mm right lung nodule which compared to previous chest CT ~ 5 years ago has increased in size by 1 mm. With her smoking history of 90 pack years, this is concerning for early stage lung cancer.    I told her the recommendation is to repeat a chest CT in 6 months along with a same day clinic visit.     She has a referral to see general pulmonology/ILD with pending autoimmune work up in addition to lung function test, and echocardiogram.     All of her questions were answered, and she appreciated the call.     Hulen Luster, MD  Interventional Pulmonology Fellow  Division of Pulmonary Diseases and Critical Care Medicine  Pager: [phone]

## 2022-01-27 NOTE — Unmapped (Signed)
Pt presents for first out of two Ferrlicet infusion since Fall 2022..    VSS, IV placed to right AC    Pt aware of potential reaction/side effects, call bell within reach.  @  1355 pm  Ferrlicet  250 mg started  @  1555 pm   Infusion complete.  Pt tolerated without complication, VSS.   Pt stayed for observation - uneventful.   IV flushed per policy and d/c'd, gauze and coban applied.    Pt left clinic in no acute distress.

## 2022-01-28 DIAGNOSIS — R0602 Shortness of breath: Principal | ICD-10-CM

## 2022-01-28 DIAGNOSIS — J849 Interstitial pulmonary disease, unspecified: Principal | ICD-10-CM

## 2022-01-30 NOTE — Unmapped (Signed)
Abstraction Result Flowsheet Data    This patient's last AWV date: Amber Duncan Last Medicare Wellness Visit Date: Not Found  This patients last WCC/CPE date: : Not Found      Reason for Encounter  Reason for Encounter: Outreach  Primary Reason for Outreach: AWV  Text Message: No  MyChart Message: No  Outreach Call Outcome: Left message

## 2022-02-03 DIAGNOSIS — E782 Mixed hyperlipidemia: Principal | ICD-10-CM

## 2022-02-03 MED ORDER — CLONAZEPAM 0.5 MG TABLET
ORAL_TABLET | 0 refills | 0 days
Start: 2022-02-03 — End: ?

## 2022-02-03 MED ORDER — LEVETIRACETAM 1,000 MG TABLET
ORAL_TABLET | 3 refills | 0 days
Start: 2022-02-03 — End: ?

## 2022-02-03 MED ORDER — PRAVASTATIN 10 MG TABLET
ORAL_TABLET | 3 refills | 0 days
Start: 2022-02-03 — End: ?

## 2022-02-04 MED ORDER — PRAVASTATIN 10 MG TABLET
ORAL_TABLET | 2 refills | 0 days | Status: CP
Start: 2022-02-04 — End: ?

## 2022-02-04 NOTE — Unmapped (Signed)
Refill request received from patient.      Medication Requested:  clonazePAM (KLONOPIN) 0.5 MG tablet   levETIRAcetam (KEPPRA) 1000 MG tablet     Last Office Visit: 11/25/2021   Next Office Visit: 06/23/2022  Last Prescriber:    Morene Rankins, MD         Nurse refill requirements met? No  If not met, why: Can not refill per prtocol    Sent to: Provider for signing  If sent to provider, which provider?:    Morene Rankins, MD

## 2022-02-04 NOTE — Unmapped (Signed)
Refill request received for patient.      Medication Requested: pravastatin  Last Office Visit: 09/16/2021   Next Office Visit: Visit date not found  Last Prescriber: Barbette Merino    Nurse refill requirements met? Yes  If not met, why:     Sent to: Pharmacy per protocol  If sent to provider, which provider?:

## 2022-02-05 ENCOUNTER — Ambulatory Visit: Admit: 2022-02-05 | Discharge: 2022-02-06 | Payer: PRIVATE HEALTH INSURANCE

## 2022-02-05 MED ORDER — LEVETIRACETAM 1,000 MG TABLET
ORAL_TABLET | 3 refills | 0 days | Status: CP
Start: 2022-02-05 — End: ?

## 2022-02-05 MED ORDER — CLONAZEPAM 0.5 MG TABLET
ORAL_TABLET | 0 refills | 0 days | Status: CP
Start: 2022-02-05 — End: ?

## 2022-02-05 MED ADMIN — cetirizine (ZyrTEC) tablet 10 mg: 10 mg | ORAL | @ 16:00:00 | Stop: 2022-02-05

## 2022-02-05 MED ADMIN — acetaminophen (TYLENOL) tablet 650 mg: 650 mg | ORAL | @ 16:00:00 | Stop: 2022-02-05

## 2022-02-05 MED ADMIN — sodium ferric gluconate (FERRLECIT) 250 mg in sodium chloride (NS) 0.9 % 100 mL IVPB: 250 mg | INTRAVENOUS | @ 16:00:00 | Stop: 2022-02-05

## 2022-02-05 NOTE — Unmapped (Signed)
Pt presents for first out of two Ferrlicet infusion since Fall 2022..    VSS, IV placed to right AC    Pt aware of potential reaction/side effects, call bell within reach.  @  1225 pm  Ferrlicet  250 mg started  @  1425 pm   Infusion complete.  Pt tolerated without complication, VSS.   Pt stayed for observation - uneventful.   IV flushed per policy and d/c'd, gauze and coban applied.    Pt left clinic in no acute distress.

## 2022-02-10 ENCOUNTER — Ambulatory Visit: Admit: 2022-02-10 | Discharge: 2022-02-11 | Payer: PRIVATE HEALTH INSURANCE

## 2022-02-10 DIAGNOSIS — R5382 Chronic fatigue, unspecified: Principal | ICD-10-CM

## 2022-02-10 DIAGNOSIS — Z862 Personal history of diseases of the blood and blood-forming organs and certain disorders involving the immune mechanism: Principal | ICD-10-CM

## 2022-02-10 NOTE — Unmapped (Signed)
Thank you for seeing [place] in clinic today. Please reference the clinic note from today's visit (viewable on the MyChart portal online) for a summary of next steps.    Results of testing can be viewed via MyChart. If you do not have MyChart, you can sign up at [email or web address]   MiChart also includes the ability to send electronic messages. Please note electronic messaging is only for non-urgent questions and response times can vary (3-5 business days).    Beverly Milch, MD  Clinical Assistant Professor  Division of Gastroenterology and Hepatology  [facility]    Nurse:   Noe Gens, RN  GI Nurse Coordinator  Phone: [phone]  Fax: [phone]    Other important contact numbers:     GI Clinic Appointments: [phone]  Please call the GI Clinic appointment line if you need to schedule, reschedule or cancel an appointment in clinic.  They can also answer any questions you may have about where your appointment is located and when you need to arrive.       GI Procedure Appointments: [phone]  Please call the GI Procedures line if you need to schedule, reschedule or cancel ANY type of procedure (EGD, colonoscopy, motility testing, etc).  You can also call this number for prep instructions, etc.       Radiology: [phone]  If you are being scheduled for any type of radiology, you will need to call to receive your appointment time.  Please call this number for information.     Financial Assistance: [phone]   If you do not have insurance, or are concerned about paying for your medical bill, please call this number to discuss options.     For emergencies after normal business hours or on weekends/holidays   For severe symptoms, call 911 or proceed to the nearest emergency room   For less severe symptoms or important questions after hours contact the [facility] Operator at [phone] who can page the Gastroenterology Fellow on call.  -----------------------------------------------------------------------------------------------------------------

## 2022-02-10 NOTE — Unmapped (Addendum)
[facility] FACULTY PRACTICE GASTROENTEROLOGY CLINIC CONSULTATION     Primary care provider:   [name], MD  [address] [place] [address]    Referring provider:   [name], MD  [address]  [place],  [place] [address]    Reason for consult:  Amber Duncan is seen in consultation at the request of Dr. Vinson Moselle for fatigue/IDA.      Assessment:        Amber Duncan is a 71 y.o. female with h/o IDA on iron infusions, CAD s/p PCI 2021 and 2008, possible ILD, history of breast cancer 2022 s/p lumpectomy/radiation, GERD, diabetes C/B gastroparesis, H/oh SIBO, dystonia/movement disorder on Keppra/Klonopin, HLD, HTN, migraines, OSA, CCY who presents with a chief complaint of fatigue.    Recommendations:     #H/o AVMS c/b GI bleeding c/b IDA: Long-standing history of AVM related GI bleeding first noted on EGD/Colon/VCE 2017/2018. Gastric and duodenal bx in the past have rule out HP and celiac. Last endoscopic eval was 10/2019 EGD and Colon at [facility] notable for the treatment of 3 non-bleeding AVMs (one in stomach, one duodenum, one colon). At the time had Hgb in the 7s and was on antiplatelet post PCI. Since then Hgb has been normal although has received iron infusions q6-8 months. Suspect she may have some slow chronic bleeding related to AVMs given prior history, but no acute blood loss and has been able to maintain adequate iron store/s normal hgb with intermittent iron infusions. Given her risk factors for endoscopy (age/ cardiopulm comorbidities) and her overall stability on iron infusions would favor to continue iron infusion as needed at this time as opposed to repeat endoscopy as unlikely to change clinical course given recurrent nature of AVMs.  - Continue to follow with hematology for iron infusions prn and continue PO iron  - Pursue pulmonary eval of possible ILD; question this as primary contributor to her fatigue    #GERD: Mild heartburn/ regurgitation predominantly in evening. Recommend appropriate timing of Omeprazole.  - Change Omeprazole 20 mg daily to 30 minutes before dinner  - Educated re: lifestyle changes with sleeping of the bed at a slight upright angle, avoiding food w/in 3 hrs of sleeping, minimizing foods that make GERD worse including tomato-based products, chocolate, caffeine, spicy foods, citrus, and alcohol    #CRC Screening/Surveillance: Family history negative. Last colonoscopy 2021 with no polyps.  - No further routine surveillance recommended given age    The patient currently has Diabetes stable which affects the conditions I am currently treating. I recommend continuing with the current plan of care for management and follow-up with their PCP.    Follow up 12 months or sooner as needed.     The risks and benefits of my recommendations, as well as other treatment options were discussed with the patient today. Questions were answered.     [For referring physician: Thank you for referring this patient to our clinic. Please feel free to contact me with any questions or concerns about this patient's care.]     I personally spent 60 minutes face-to-face and non-face-to-face in the care of this patient, which includes all pre, intra, and post visit time on the date of service.    Beverly Milch, MD  Clinical Assistant Professor  Division of Gastroenterology and Hepatology  [facility]  Office: [phone]        Subjective:        HPI: Amber Duncan is a 71  y.o. female with h/o IDA on iron infusions, CAD s/p PCI 2021 and 2008, possible ILD, history of breast cancer 2022 s/p lumpectomy/radiation, GERD, diabetes C/B gastroparesis, H/oh SIBO, dystonia/movement disorder on Keppra/Klonopin, HLD, HTN, migraines, OSA, CCY who presents with a chief complaint of fatigue.    Established with me in [facility] 02/2022:  Originally referred 07/2021.  From a standpoint of her IDA she has been followed by hematology at [facility] last seen 01/2022.  Based on their notes she was first noted to be anemic in 2015 with most likely cause suspected to be blood loss from AVMs.  She did have GI evaluation at [facility] in 2017 with capsule endoscopy at that time that showed an AVM in the small bowel.  This was followed up by EGD with treatment of that AVM.  In 2021 she had an episode of overt bleeding with hemoglobin 7.5.  Colonoscopy and EGD at that time again showed bleeding AVMs thought to be exacerbated by anticoagulation after cardiac stent placement.  Multiple AVMs were treated.  She has been intermittently anemic since then.  Taking 1 baby aspirin.  No vaginal bleeding.  Varied diet.  Baseline dark stools she associates with the iron, but denies overt bleeding. Labs at the time were notable for hemoglobin 11.4 and ferritin 52.  Plan was to treat to a goal ferritin greater than 100 and so she was advised continuing p.o. iron as well as planning for 2 additional IV iron infusions.  She was also noted to have B12 deficiency and advised p.o. B12.    On visit today reports she is hoping for improvement in fatigue. Said that in the past having iron infusions has helped partially with that, but at this point she is unsure what the source of her fatigue is. From a GI standpoint she has 1-2 bms a day (dark long-standing related to iron). No change in this in the recent past. Other GI ROS is notable for mild heartburn/ regurgitation helped/ but not resolved by Omeprazole 20 mg before bedtime, nausea without vomit, transient lower abd pains intermittent. Weight is stable. Says she recently completed 2 iron infusions and has not noticed much benefit yet in fatigue. Says she is undergoing pulmonary workup as well for possible ILD. Also endorses long-standing dystonia with abd muscle dysregulation and difficulty with maximum inspiration.     Prior workup:   - Labs:   10/2021 CMP notable for creatinine 1.05  01/2022 hemoglobin 11.4, MCV 100.9, platelets 228, ESR normal, CRP normal, ferritin 52, TSAT 31, TSH normal  - Imaging: 01/2022 CT Chest 9 mm right middle lobe nodule, while perifissural, is increasing in size. Recommend follow-up scan in 3 months. Increasing mild to moderate pulmonary fibrosis without honeycombing. Right coronary stent.   - Procedures:   2017 VCE: Gastric erosion and healing ulcer, multiple small and medium sized AVMs  2017 EGD: Grade a esophagitis, medium size hiatal hernia, normal stomach, multiple bleeding AVMs in the duodenum treated with APC  2018 colonoscopy: Normal TI, diverticulosis, 2 nonbleeding AVMs in the cecum treated with APC, 4 small polyps (SSA's)  04/2018 EGD: Normal esophagus, small hiatal hernia, stomach biopsies normal, duodenal mucosal atrophy (biopsies unremarkable)  10/2019 EGD ([facility]): Widely patent Schatzki ring, 1 cm hiatal hernia, nonbleeding AVM in the stomach treated with APC, nonbleeding AVM in the duodenum treated with APC  10/2019 colonoscopy ([facility]): Adequate bowel prep, somewhat difficult due to significant looping.  Transverse colon diverticula.  Single nonbleeding colonic angiectasia  treated with APC, melanosis in the colon.  - Abdominal surgeries: CCY, lap for intussusception per patient    Previous smoking.  Denies current tob, ETOH, illicits. No known FH of FDRs with GI malignancy or IBD.    The following portions of the patient's history were reviewed and updated as appropriate: allergies, current medications, past family history, past medical history, past social history, past surgical history and problem list.    Review of Systems: Pertinent positive and negative systems are described in the HPI; the remainder of the 14 systems are negative.    Past Medical/Surgical History  Patient Active Problem List   Diagnosis    Allergic rhinitis    Anemia    Coronary artery disease involving native coronary artery of native heart without angina pectoris    Bilateral occipital neuralgia    Cephalalgia    COPD (chronic obstructive pulmonary disease) (CMS-HCC)    Diabetes mellitus without complication (CMS-HCC)    Dyspnea    Dysphonia spastica    Essential hypertension    Hoarseness or changing voice    Mixed hyperlipidemia    Iron deficiency anemia    Lung nodules    Obesity    Hypothyroidism    OSA on CPAP    Voice resonance disorder    Spasm of diaphragm    Propriospinal myoclonus    Angiodysplasia of intestine    Weight loss    Chronic fatigue    Irritable bowel syndrome with diarrhea    Malignant neoplasm of lower-inner quadrant of left breast in female, estrogen receptor positive (CMS-HCC)    Chronic UTI    Closed fracture of fourth metatarsal bone of left foot    Arm pain, anterior, left    Iron deficiency anemia due to chronic blood loss    Depression    Vitamin B12 deficiency without anemia     Past Surgical History:   Procedure Laterality Date    BACK SURGERY      BREAST BIOPSY      BREAST LUMPECTOMY Left 01/2020    CATARACT EXTRACTION      CHOLECYSTECTOMY      CORONARY STENT PLACEMENT      HYSTERECTOMY      KNEE SURGERY      PR COLONOSCOPY,ABLATE LESION N/A 01/21/2017    Procedure: COLONOSCOPY FLEX; W/ABLAT LES NOT AMENABLE-SNARE;  Surgeon: Luanne Bras, MD;  Location: [facility] PROCEDURES [facility];  Service: Gastroenterology    PR COLSC FLX W/RMVL OF TUMOR POLYP LESION SNARE TQ N/A 01/21/2017    Procedure: COLONOSCOPY FLEX; W/REMOV TUMOR/LES BY SNARE;  Surgeon: Luanne Bras, MD;  [facility]: [facility];  Service: Gastroenterology    PR MASTECTOMY, PARTIAL Left 01/19/2020    Procedure: MASTECTOMY, PARTIAL (EG, LUMPECTOMY, TYLECTOMY, QUADRANTECTOMY, SEGMENTECTOMY);  Surgeon: Talbert Cage, DO;  Location: MAIN OR [facility];  Service: Surgical Oncology Breast    PR UPPER GI ENDOSCOPY,BIOPSY N/A 04/29/2018    Procedure: UGI ENDOSCOPY; WITH BIOPSY, SINGLE OR MULTIPLE;  Surgeon: Rona Ravens, MD;  [facility];  Service: Gastroenterology    RADIATION Left     REDUCTION MAMMAPLASTY      REPLACEMENT TOTAL KNEE Right        Family History:   Family History   Problem Relation Age of Onset    Cancer Brother         liver    Cancer Maternal Aunt     Cancer Maternal Uncle     Diabetes  Paternal Uncle     Other Mother         MVA    Other Father         MVA    No Known Problems Sister     No Known Problems Son     Cancer Brother         Lung    Other Sister         MVA    Other Sister         MVA drunk driver    No Known Problems Brother     No Known Problems Daughter     No Known Problems Maternal Grandmother     No Known Problems Maternal Grandfather     No Known Problems Paternal Grandmother     No Known Problems Paternal Grandfather     No Known Problems Other     Heart failure Neg Hx     BRCA 1/2 Neg Hx     Breast cancer Neg Hx     Colon cancer Neg Hx     Endometrial cancer Neg Hx     Ovarian cancer Neg Hx        Social History:   Social History     Socioeconomic History    Marital status: Divorced   Tobacco Use    Smoking status: Former     Packs/day: 2.00     Years: 48.00     Pack years: 96.00     Types: Cigarettes     Quit date: 11/06/2011     Years since quitting: 10.2     Passive exposure: Past    Smokeless tobacco: Never   Vaping Use    Vaping Use: Never used   Substance and Sexual Activity    Alcohol use: No    Drug use: No   Social History Narrative    She lives in [place]. 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 She denies alcohol and illicit drug use.       Social Determinants of Health     Transportation Needs: No Transportation Needs    Lack of Transportation (Medical): No    Lack of Transportation (Non-Medical): No       Medications:  Current Outpatient Medications on File Prior to Visit   Medication Sig    aspirin-calcium carbonate 81 mg-300 mg calcium(777 mg) Tab Take 81 mg by mouth.    cephalexin (KEFLEX) 250 MG capsule Take 1 capsule daily for UTI prevention    clonazePAM (KLONOPIN) 0.5 MG tablet TAKE UP TO 2 TABLETS TWICE DAILY AS DIRECTED    d-mannose 500 mg cap Take 1,500 mg by mouth daily.    docusate sodium (COLACE) 100 MG capsule Take 1 capsule (100 mg total) by mouth daily as needed.    empty container (SHARPS-A-GATOR DISPOSAL SYSTEM) Misc Use as directed for sharps disposal    evolocumab (REPATHA SURECLICK) 140 mg/mL PnIj Inject the contents of 1 pen (140 mg) under the skin every fourteen (14) days.    ferrous gluconate 324 MG tablet Take 1 tablet (324 mg total) by mouth once.    FLUoxetine (PROZAC) 20 MG capsule Take 1 capsule (20 mg total) by mouth daily.    levETIRAcetam (KEPPRA) 1000 MG tablet TAKE 1 TABLET BY MOUTH 2 TIMES DAILY    lisinopriL (PRINIVIL,ZESTRIL) 20 MG tablet Take 1 tablet (20 mg total) by mouth daily.    metFORMIN (GLUCOPHAGE) 500 MG tablet  Take 2 tablets (1,000 mg total) by mouth in the morning and 2 tablets (1,000 mg total) in the evening. Take with meals.    omeprazole 20 mg TbLD Take by mouth nightly as needed.    pravastatin (PRAVACHOL) 10 MG tablet TAKE 0.5 TABLET BY MOUTH DAILY.    SYNTHROID 100 mcg tablet Take 1 tablet (100 mcg total) by mouth daily.     No current facility-administered medications on file prior to visit.       Allergies:   Patient has no known allergies.        Objective:      VS: BP 185/96 (BP Site: L Arm, BP Position: Sitting, BP Cuff Size: Medium)  - Pulse 78  - Temp 36.3 ??C (97.3 ??F) (Temporal)  - Ht 167.6 cm (5' 6)  - Wt 83.5 kg (184 lb)  - SpO2 97%  - BMI 29.70 kg/m??   Physical exam:     GEN: Well appearing, NAD  HEENT: Anicteric  PULM: Nml WOB.  GI/ABD: Soft, NT, ND. +BS. No organomegaly or masses. No rebound or guarding.  RECTAL: Deferred  SKIN: No jaundice. No rashes or lesions on exposed areas.  NEURO: Alert and oriented. No focal deficits.    Laboratory: Reviewed in Epic and/or outside records.     Results for orders placed or performed during the hospital encounter of 01/26/22   Spirometry Pre / Post Bronchodilator   Result Value Ref Range    FVC POST 2.52 1.98 - 3.54 L    FVC PRE 2.57 1.98 - 3.54 L FEV1 POST 1.86 1.52 - 2.68 L    FEV1 PRE 1.75 1.52 - 2.68 L    FEV1/FVC POST 73.86 64.14 - 89.60 %    FEV1/FVC PRE 68.19 64.14 - 89.60 %    FEV6 POST 2.43 2.09 - 3.44 L    FEV6 PRE 2.37 2.09 - 3.44 L    FEV1/FEV6 POST 76.83 70.05 - 87.65 %    FEV1/FEV6 PRE 73.84 70.05 - 87.65 %    FEF25-75% POST 1.31 0.79 - 3.17 L/s    FEF25-75% PRE 0.92 0.79 - 3.17 L/s    ISOFEF25-75 POST 1.19 L/s    ISOFEF25-75 PRE 0.92 L/s    FEF50% POST 1.79 1.52 - 5.14 L/s    FEF50% PRE 1.39 (L) 1.52 - 5.14 L/s    PEF POST 6.13 3.76 - 7.16 L/s    PEF PRE 6.51 3.76 - 7.16 L/s    FET100% POST 9.07 sec    FET100% Change 11.69 sec    FIVC POST 1.87 (L) 1.98 - 3.54 L    FIVC PRE 2.22 1.98 - 3.54 L    FIF50% POST 3.72 L/s    FIF50% PRED 3.61 L/s    ZOX/WRU04 post 48.22 %    VWU/JWJ19 pre 34.05 %    PIF POST 3.75 1.86 - 6.66 L/s    PIF PRE 4.21 1.86 - 6.66 L/s    VOL extrap post 0.09 L    Vol extrap pre 0.06 L    DLCO PRE 12.33 (L) 14.36 - 26.13 ml/(min*mmHg)    DL Adj PRE 14.78 (L) 29.56 - 26.13 ml/(min*mmHg)    DLCO/VA POST 3.44 3.17 - 5.32 ml/(min*mmHg*L)    DL/VA Adj PRE 2.13 0.86 - 5.32 ml/(min*mmHg*L)    VA PRE 3.58 (L) 3.69 - 5.80 L    IVC PRE 2.19 1.98 - 3.54 L    BHT POST 10.46 sec   Diffusion Studies   Result Value  Ref Range    FVC POST 2.52 1.98 - 3.54 L    FVC PRE 2.57 1.98 - 3.54 L    FEV1 POST 1.86 1.52 - 2.68 L    FEV1 PRE 1.75 1.52 - 2.68 L    FEV1/FVC POST 73.86 64.14 - 89.60 %    FEV1/FVC PRE 68.19 64.14 - 89.60 %    FEV6 POST 2.43 2.09 - 3.44 L    FEV6 PRE 2.37 2.09 - 3.44 L    FEV1/FEV6 POST 76.83 70.05 - 87.65 %    FEV1/FEV6 PRE 73.84 70.05 - 87.65 %    FEF25-75% POST 1.31 0.79 - 3.17 L/s    FEF25-75% PRE 0.92 0.79 - 3.17 L/s    ISOFEF25-75 POST 1.19 L/s    ISOFEF25-75 PRE 0.92 L/s    FEF50% POST 1.79 1.52 - 5.14 L/s    FEF50% PRE 1.39 (L) 1.52 - 5.14 L/s    PEF POST 6.13 3.76 - 7.16 L/s    PEF PRE 6.51 3.76 - 7.16 L/s    FET100% POST 9.07 sec    FET100% Change 11.69 sec    FIVC POST 1.87 (L) 1.98 - 3.54 L    FIVC PRE 2.22 1.98 - 3.54 L    FIF50% POST 3.72 L/s    FIF50% PRED 3.61 L/s    ZOX/WRU04 post 48.22 %    VWU/JWJ19 pre 34.05 %    PIF POST 3.75 1.86 - 6.66 L/s    PIF PRE 4.21 1.86 - 6.66 L/s    VOL extrap post 0.09 L    Vol extrap pre 0.06 L    DLCO PRE 12.33 (L) 14.36 - 26.13 ml/(min*mmHg)    DL Adj PRE 14.78 (L) 29.56 - 26.13 ml/(min*mmHg)    DLCO/VA POST 3.44 3.17 - 5.32 ml/(min*mmHg*L)    DL/VA Adj PRE 2.13 0.86 - 5.32 ml/(min*mmHg*L)    VA PRE 3.58 (L) 3.69 - 5.80 L    IVC PRE 2.19 1.98 - 3.54 L    BHT POST 10.46 sec       Lab Results   Component Value Date    WBC 4.6 01/13/2022    RBC 3.34 (L) 01/13/2022    HGB 11.4 01/13/2022    Hemoglobin 12.1 10/11/2020    HCT 33.7 (L) 01/13/2022    Platelet 228 01/13/2022     Lab Results   Component Value Date    Amylase 75 12/10/2016     Lab Results   Component Value Date    Lipase 83 12/10/2016     Lab Results   Component Value Date    ALT 7 (L) 10/13/2021    AST 11 10/13/2021    ALKPHOS 86 10/13/2021    BILITOT 0.4 10/13/2021       Imaging: Reviewed in Epic and/or outside records    Procedures: Reviewed in Epic and/or outside records    Outside record review:   Relevant Scanned and Care Everywhere records, if available, were reviewed in the context of this visit.         Portions of this record have been created using Scientist, clinical (histocompatibility and immunogenetics). Dictation errors have been sought, but may not have been identified and corrected.

## 2022-02-11 LAB — MYOSITIS
ANTI-JO-1 AB: <20 U
ANTI-PM/SCL AB: <20 U
ANTI-U1-RNP AB: 25 U — ABNORMAL HIGH
EJ: NEGATIVE
FIBRILLARIN (U3 RNP): NEGATIVE
KU: NEGATIVE
MDA-5 (P140): <20 U
MI-2: NEGATIVE
NXP-2 (P140): <20 U
OJ: NEGATIVE
PL-12: NEGATIVE
PL-7: NEGATIVE
SRP: NEGATIVE
TIF1 GAMMA (P155/140): <20 U
U2 SNRNP: NEGATIVE

## 2022-02-11 NOTE — Unmapped (Signed)
[facility] Refill Coordination Note    Specialty Medication(s) to be Shipped:   General Specialty: Repatha    Other medication(s) to be shipped: No additional medications requested for fill at this time     Amber Duncan, DOB: March 22, 1951  Phone: [phone] (home)       All above HIPAA information was verified with patient.     Was a Nurse, learning disability used for this call? No    Completed refill call assessment today to schedule patient's medication shipment from the [facility] [phone]).  All relevant notes have been reviewed.     Specialty medication(s) and dose(s) confirmed: Regimen is correct and unchanged.   Changes to medications: Nadezhda reports no changes at this time.  Changes to insurance: No  New side effects reported not previously addressed with a pharmacist or physician: None reported  Questions for the pharmacist: No    Confirmed patient received a Conservation officer, historic buildings and a Surveyor, mining with first shipment. The patient will receive a drug information handout for each medication shipped and additional FDA Medication Guides as required.       DISEASE/MEDICATION-SPECIFIC INFORMATION        For patients on injectable medications: Patient currently has 0 doses left.  Next injection is scheduled for 02/19/22.    SPECIALTY MEDICATION ADHERENCE     Medication Adherence    Patient reported X missed doses in the last month: 0  Specialty Medication: Repatha 140mg /ml  Patient is on additional specialty medications: No  Patient is on more than two specialty medications: No              Were doses missed due to medication being on hold? No    Repatha 140 mg/ml: 0 days of medicine on hand       REFERRAL TO PHARMACIST     Referral to the pharmacist: Not needed      [facility]     Shipping address confirmed in Epic.     Delivery Scheduled: Yes, Expected medication delivery date: 02/16/22.     Medication will be delivered via Same Day Courier to the prescription address in Epic WAM.    Nancy Nordmann [facility] Specialty Technician

## 2022-02-13 MED ORDER — LISINOPRIL 20 MG TABLET
ORAL_TABLET | 3 refills | 0 days
Start: 2022-02-13 — End: ?

## 2022-02-16 MED ORDER — LISINOPRIL 20 MG TABLET
ORAL_TABLET | 2 refills | 0 days | Status: CP
Start: 2022-02-16 — End: ?

## 2022-02-16 MED FILL — REPATHA SURECLICK 140 MG/ML SUBCUTANEOUS PEN INJECTOR: SUBCUTANEOUS | 28 days supply | Qty: 2 | Fill #6

## 2022-02-16 NOTE — Unmapped (Signed)
Refill request received for patient.      Medication Requested: lisinopril  Last Office Visit: 09/16/2021   Next Office Visit: Visit date not found  Last Prescriber: Barbette Merino    Nurse refill requirements met? Yes  If not met, why:     Sent to: Pharmacy per protocol  If sent to provider, which provider?:

## 2022-02-17 NOTE — Unmapped (Signed)
Abstraction Result Flowsheet Data    This patient's last AWV date: [facility] Last Medicare Wellness Visit Date: Not Found  This patients last WCC/CPE date: : Not Found      Reason for Encounter  Reason for Encounter: Outreach  Primary Reason for Outreach: AWV  Text Message: No  MyChart Message: No  Outreach Call Outcome: Declined (cll pt declined AWV,has alot other appts going on)

## 2022-03-13 NOTE — Unmapped (Signed)
[facility] Refill Coordination Note    Specialty Medication(s) to be Shipped:   General Specialty: Repatha    Other medication(s) to be shipped: No additional medications requested for fill at this time     Amber Duncan, DOB: Aug 31, 1951  Phone: [phone] (home)       All above HIPAA information was verified with patient.     Was a Nurse, learning disability used for this call? No    Completed refill call assessment today to schedule patient's medication shipment from the [facility] [phone]).  All relevant notes have been reviewed.     Specialty medication(s) and dose(s) confirmed: Regimen is correct and unchanged.   Changes to medications: [name] reports no changes at this time.  Changes to insurance: No  New side effects reported not previously addressed with a pharmacist or physician: None reported  Questions for the pharmacist: No    Confirmed patient received a Conservation officer, historic buildings and a Surveyor, mining with first shipment. The patient will receive a drug information handout for each medication shipped and additional FDA Medication Guides as required.       DISEASE/MEDICATION-SPECIFIC INFORMATION        For patients on injectable medications: Patient currently has 0 doses left.  Next injection is scheduled for 03/19/22.    SPECIALTY MEDICATION ADHERENCE     Medication Adherence    Patient reported X missed doses in the last month: 0  Specialty Medication: Repatha 140mg /ml  Patient is on additional specialty medications: No  Patient is on more than two specialty medications: No              Were doses missed due to medication being on hold? No    Repatha 140 mg/ml: 0 days of medicine on hand     REFERRAL TO PHARMACIST     Referral to the pharmacist: Not needed      [facility]     Shipping address confirmed in Epic.     Delivery Scheduled: Yes, Expected medication delivery date: 03/16/22.     Medication will be delivered via Same Day Courier to the prescription address in Epic WAM.    Nancy Nordmann [facility] Specialty Technician

## 2022-03-16 MED FILL — REPATHA SURECLICK 140 MG/ML SUBCUTANEOUS PEN INJECTOR: SUBCUTANEOUS | 28 days supply | Qty: 2 | Fill #7

## 2022-03-17 ENCOUNTER — Ambulatory Visit: Admit: 2022-03-17 | Payer: PRIVATE HEALTH INSURANCE | Attending: Radiation Oncology | Primary: Radiation Oncology

## 2022-03-17 DIAGNOSIS — C50812 Malignant neoplasm of overlapping sites of left female breast: Principal | ICD-10-CM

## 2022-03-17 DIAGNOSIS — Z170 Estrogen receptor positive status [ER+]: Principal | ICD-10-CM

## 2022-03-17 NOTE — Unmapped (Signed)
RADIATION ONCOLOGY FOLLOW-UP VISIT NOTE     Encounter Date: 03/17/2022  Patient Name: Amber Duncan  Medical Record Number: [id number]    DIAGNOSIS:  71yo with a pT1bN0 L breast IDC (ER/PR pos, HER2 neg) s/p partial mastectomy (0.7cm, grade 1, no LVI) and adjuvant RT (42.72Gy finished 03/28/2020)    DURATION SINCE COMPLETION OF RADIOTHERAPY:  2 years (03/28/2020)    ASSESSMENT:  Disease Status: No Evidence of Disease (NED) on mammogram 09/2021 or on exam.    RECOMMENDATIONS:  1. FOLLOW-UP:  Surg onc in 6 months with bilateral mammo.  I can see back in 12 months  2. R breast pain:  Unclear etiology- this is distinct form the R breast discomfort she was having earlier this year (with benign mammo) and associated with breast swelling.  Will repeat R mammo to further evaluate  3. Skin:  No significant issues  4. Lung nodule and possible ILD- following with pulm- follow-up CT later this month  5. Iron deficiency:  Following with hematology.  May be related to known AVMs and GI bleeding but no further evaluation planned for now  6. Endocrine therapy:  Declined    INTERVAL HISTORY:  Doing OK since last seen in clinic.  She has no issues with the L breast- no pain, swelling, skin changes, ROM limitations in L arm.  She has had some intermittent issues with the right breast.  She was having some shooting pains earlier this year prompting mammo that showed no concerning lesions in 09/2021.  These pains resolved but she has now developed an ache within the R breast with associated breast swelling.  No arm swelling, ROM limitations on R side.  Separately she had a lung cancer screening chest CT earlier this year demonstrating growth of a previously identified R lung nodule as well as reticular opacities, GGOs, and other changes throughout bilateral lungs.  She has repeat imaging scheduled for later this month with pulm.  She is overall fatigued but attributes this to low iron/anemia and receives iron infusions with heme.  She is looking forward to a trip to [place] to visit family (who paid all the expenses)    REVIEW OF SYSTEMS:  A comprehensive review of 10 systems was negative except for pertinent positives noted in HPI.    PAST MEDICAL HISTORY/FAMILY HISTORY/SOCIAL HISTORY:  Reviewed in EPIC    ALLERGIES/MEDICATIONS:  Reviewed in EPIC    PHYSICAL EXAM:  Vital Signs for this encounter:   BP 126/70  - Pulse 93  - Temp 36.6 ??C (97.8 ??F) (Temporal)  - Resp 17  - Wt 84.8 kg (186 lb 14.4 oz)  - SpO2 96%  - BMI 30.17 kg/m??   Karnofsky/Lansky Performance Status: 80, Normal activity with effort; some signs or symptoms of disease (ECOG equivalent 1)  General:   No acute distress, alert and oriented X 4   Head: Normocephalic, without obvious abnormality, atraumatic   Eyes: EOMI, no scleral icterus  Lungs:Normal work of breathing  Heart: RRR, S1/S2 normal, no murmur/rub/gallop  Extremities: Extremities normal, atraumatic.  No edema in bilateral upper extremities.  Lymph nodes: No palpable axillary or supraclavicular lymphadenopathy  Neurologic: Grossly normal  Breast:  Bilateral breast exam performed.  No palpable mass in the R breast, no overlying skin changes.  No palpable mass in the L breast, no overlying skin changes    RADIOLOGY:      09/19/2021 R breast mammo  At the lumpectomy site in the lower, central left breast, there is  expected density and architectural distortion. A bb marks the sites of focal concern in the right breast.  There are no suspicious masses, malignant calcifications, sites of non-surgical architectural distortion, or asymmetries subjacent to the bb or elsewhere in either breast. Stable asymmetry and benign calcifications in the retroareolar right breast. Immediately beneath the areas of concern involving the left breast are areas of normal fatty tissue.    Labs:    No results found for: WBC, HGB, HCT, PLT, LDH, CREATININE, AST, ALT, MG    Rayetta Humphrey, MD  Assistant Professor  [facility] Dept of Radiation Oncology  03/17/2022

## 2022-03-17 NOTE — Unmapped (Signed)
Chaperoned during breast exam 

## 2022-03-17 NOTE — Unmapped (Signed)
Amber Duncan here for follow-up with Dr. Carles Collet.    Skin: Intact  ROM: Normal  Pain/Swelling/Lymphedema: No pain or edema  Energy Level/Fatigue/Active Level: Low energy  Elimination: No issues  Appetite/Diet: Adequate  Nausea/Vomitting: Occasionally     Last Mammogram: Mammo - Jan 2023  Chemo: NA  Weight: Stable  Tamoxifen/Letrozole/Aromasin (hot flashes, joint pain, depression): NA  Psychosocial: No needs  Refills of Meds: None    Notified Dr. Carles Collet.

## 2022-03-25 ENCOUNTER — Ambulatory Visit: Admit: 2022-03-25 | Discharge: 2022-03-26 | Payer: PRIVATE HEALTH INSURANCE

## 2022-03-25 DIAGNOSIS — C50812 Malignant neoplasm of overlapping sites of left female breast: Principal | ICD-10-CM

## 2022-03-25 DIAGNOSIS — Z170 Estrogen receptor positive status [ER+]: Principal | ICD-10-CM

## 2022-03-30 NOTE — Unmapped (Signed)
Referring Physician: Melody Haver    Reason for consult: I am seeing Amber Duncan in consultation for Dr. Oda Kilts  to evaluate a possible interstitial lung disease and advice on diagnostic testing and treatment options    Patient seen and examined with Dr. Joyice Faster, MD, pulmonary fellow    HPI The patient is a 71 yo with Pmhx of tobacco abuse (45 years x 2 ppd, quit 2013), L breast cancer s/p lumpectomy and xrt,  who presents with a complaint of abnormal chest CT and DOE over last several months. Patient states she has been in a cancer screening program for the last 5-6 years and they had been following lung nodules. This year, a nodule was enlarging and there was evidence of worsening fibrosis in the lung. Patient states she used to have periodic shortness of breath where she feels like she is suffocating and having chest tightness (squeezing around the diaphragm). Patient states she get short of breath when walking which was also made worse by leg weakness. Nothing seems to improve the breathing. Patient was diagnosed with cervical spine myoclonus and has related severe chest discomfort and voice changes. She is on Keppra on klonopin which doesn't seem to help. She coughs most days and is both non-productive and productive. Sputum is clear. No hemoptysis. Minimal LE edema but does get orthopnea. No noticeable weight changes. She first noticed issues with her breathing and chest tightness 7 years ago and has been getting progressively worse.     She denies any prior history of lung problems. She is a former smoker and smoked 2ppd x 45 years. Quit 2013. No vaping or e-cigarettes. No marijuana use. She is retired. 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 Lives in [place], [place] in a mobile home. No known water damage or mold. She has lived there for 7 years. No pets. No birds. She had breast cancer 2 years ago and is s/p lumpectomy and 20 rounds of radiation. No chemotherapy. She is in remission.     Systemic Symptoms:  A. Fatigue   B. Joint stiffness, pain (hips), or swelling   C. Difficulty swallowing or food getting stuck in your throat   D. Persistently dry eyes or dry mouth   E. Pain or color change (white/red) in fingers with cold weather. No but will often feel cold requiring warmers.   F. Recurrent fever   G. Weight loss   H. Heartburn (on PPI), reflux, or sour taste in mouth after eating   I. Snoring, morning headaches, or excessive daytime sleepiness   J. Rash   K. Ulcers in the mouth     Environmental Exposures:   A. Humidifier   B. Air cleaner/purifier   C. Steam sauna/steam shower   D. Indoor hot tub   E. Farm (as a kid, herded cows)   F. Water damage or mold/mildew in the home   G. Asbestos   H. Down pillows or comforters   I. Pigeons, parakeets or other birds       REVIEW OF SYSTEMS:   CONSTITUTIONAL: Negative for fevers and chills  EYES: no conjunctival injection, no redness or drainage  ENT: Negative for oropharyngeal exudate, post nasal drip, sinus pain / pressure, nasal congestion, no oral ulcerations  RESPIRATORY: No hemoptysis or pleuritic pain.  CARDIOVASCULAR: Negative  for chest pain, palpitations, PND  GASTROINTESTINAL: Negative for nausea, vomiting, diarrhea, constipation and abdominal pain  MUSCULOSKELETAL: No arthralgias/arthritis or muscle weakness  HEMATOLOGICAL/LYMPH: Negative for adenopathy  SKIN: No rash or nodules  EXTREMITIES: Negative for cyanosis or edema.  NEUROLOGICAL: Negative for unilateral weakness (drags right foot), speech or gait abnormalities       PMH   Past Medical History:   Diagnosis Date    Breast cancer (CMS-HCC)     Chronic UTI     COPD (chronic obstructive pulmonary disease) (CMS-HCC)     Coronary artery disease     Diabetes (CMS-HCC)     Disease of thyroid gland     Fatigue     Hepatitis Hepatitis     Hyperlipidemia     Hypertension     Migraine     Sleep apnea            FH   - Brother with lung cancer in remission  - Uncle with lung cancer   - No family history of lung fibrosis. Parents died young in a car wreck   Family History   Problem Relation Age of Onset    Cancer Brother         liver    Cancer Maternal Aunt     Cancer Maternal Uncle     Diabetes Paternal Uncle     Other Mother         MVA    Other Father         MVA    No Known Problems Sister     No Known Problems Son     Cancer Brother         Lung    Other Sister         MVA    Other Sister         MVA drunk driver    No Known Problems Brother     No Known Problems Daughter     No Known Problems Maternal Grandmother     No Known Problems Maternal Grandfather     No Known Problems Paternal Grandmother     No Known Problems Paternal Grandfather     No Known Problems Other     Heart failure Neg Hx     BRCA 1/2 Neg Hx     Breast cancer Neg Hx     Colon cancer Neg Hx     Endometrial cancer Neg Hx     Ovarian cancer Neg Hx           Social History     Socioeconomic History    Marital status: Divorced   Tobacco Use    Smoking status: Former     Packs/day: 2.00     Years: 48.00     Pack years: 96.00     Types: Cigarettes     Quit date: 11/06/2011     Years since quitting: 10.4     Passive exposure: Past    Smokeless tobacco: Never   Vaping Use    Vaping Use: Never used   Substance and Sexual Activity    Alcohol use: No    Drug use: No    Sexual activity: Not Currently   Social History Narrative    She lives in [place]. 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 She denies alcohol and illicit drug use.       Social Determinants of Health  Transportation Needs: No Engineer, petroleum (Medical): No    Lack of Transportation (Non-Medical): No          MEDS (personally reviewed in EPIC, pertinent meds noted below)     PHYSICAL EXAM   BP 123/77  - Pulse 101  - Ht 167.6 cm (5' 5.98)  - Wt 83.5 kg (184 lb)  - SpO2 96%  - BMI 29.71 kg/m??       General Appearance:   Well-developed, well-nourished, comfortable, no distress, appears stated age.   Eyes:  PERRL, conjunctiva clear, EOM's intact. No drainage.   Ears:  Normal external ears   Nose: Nares normal, septum midline, mucosa normal, no drainage    or sinus tenderness, no polyps   Oropharynx: Moist mucus membranes without lesions or thrush.  Good dentition.  Posterior pharynx clear.   Neck:  Supple. Trachea midline. No masses.   Respiratory:   Clear breath sounds bilaterally.  No crackles, wheezes, or rhonchi.  No accessory muscle use. Symmetric chest expansion.    Cardiovascular:  Regular rate and rhythm. S1 and S2 normal. No murmur, rub  or gallop.  No JVD.  No peripheral edema   Gastrointestinal:   Abdomen soft, non-tender, and non-distended   Musculoskeletal: No tenderness or deformity of the chest wall.  Joints normal without swelling.   No clubbing or cyanosis. Normal tone   Skin:  No rashes, lesions, skin changes.  No bruising or petechiae.       Neurologic/Psychiatric:  Alert and oriented x3. Appropriate affect. No focal neurological deficits.  Normal gait and motor function.       RESULTS     Labs (reviewed in EPIC, pertinent values noted below)     Autoimmune serologies/markers:     ANA, RF, CCP, Anti-myositis panel, ENA, CK, Aldolase, CRP, sed rate, ANCA    The following were within normal limits:  5/23: CRP, aldolase, RF, ENA, CCP      The following were abnormal:    5/23: myositis panel + U1 RNP), ANA 80 homog    PFTs (personally reviewed and interpreted)     Date: FVC (% Pred) FEV1 (% Pred) ratio TLC DLCO low sat dist   01/26/22 2.57 (94) 1.75 (83) no bd resp 68%  12.33 (64) 98% 373 m                                                                       I personally reviewed the following studies:    Chest CT 01/13/22: c/w 06-22-2017 9 x 9 x 5 mm right middle lobe perifissural nodule previously 7 x 6 x 3 mm. 7 mm nodule periphery left lower lobe image 204 series 3. Minimal groundglass opacities lung bases. Increased mild to moderate peripheral septal thickening and reticular opacities.        Assessment/Plan    ILD (interstitial lung disease) (CMS-HCC)  The etiology of the patient's Interstitial Lung Disease is unknown. The differential diagnosis is broad and includes idiopathic interstitial pneumonias (IIP), connective tissue associated ILD (CT-ILD), organic antigen exposures leading to chronic hypersensitivity pneumonitis, occupational exposures or pneumoconioses, medication exposures and other rarer diseases. Based upon the patient's history, exam, and diagnostic studies to date,  I favor a diagnosis of Idiopathic interstitial pneumonia (IPF >NSIP> smoking related ILD- though slow progressing), an antisynthetase syndrome (+ SRM ab)     - get a few more lab tests -ck,aldolase, IgG, ANCA  - repeat PFTs in 3 months to follow pace of illness  - refer to pulm rehab  - consider other treatment based upon new data    Lung nodules  - patient will need repeat chest CT in 6 months to follow up slowly growing nodule in RML         PCP Proph:  - not indicated    [facility]  - Will defer to PCP  - Last Dexa:    Vaccines  Immunization History   Administered Date(s) Administered Comments    COVID-19 VACC,MRNA,(PFIZER)(PF) 10/20/2019 Historical - Not administered in Epic     11/10/2019 Historical - Not administered in Epic     05/31/2020 [facility]    INFLUENZA INJ MDCK PF, QUAD,(FLUCELVAX)(58MO AND UP EGG FREE) 05/15/2021     INFLUENZA QUAD HIGH DOSE 64YRS+(FLUZONE) 06/02/2016      05/24/2018      04/27/2019     INFLUENZA TIV (TRI) 58MO+ W/ PRESERV (IM) 05/26/2013      06/10/2014     INFLUENZA TIV (TRI) PF (IM) 05/23/2011      05/30/2012     Influenza Virus Vaccine, unspecified formulation 06/18/2015      06/14/2017      05/17/2020     Influenza Whole 06/07/2012     PNEUMOCOCCAL POLYSACCHARIDE 23 09/08/2007      02/21/2016 04/27/2019     Pneumococcal Conjugate 13-Valent 09/10/2014      02/09/2018     ZOSTAVAX - ZOSTER VACCINE, LIVE, SQ 04/27/2011         30 min was spent with the patient face to face and 30 min was spent reviewing chart/imaging.    Complex medical decision making was performed.

## 2022-03-31 ENCOUNTER — Ambulatory Visit: Admit: 2022-03-31 | Discharge: 2022-03-31 | Payer: PRIVATE HEALTH INSURANCE

## 2022-03-31 DIAGNOSIS — J849 Interstitial pulmonary disease, unspecified: Principal | ICD-10-CM

## 2022-03-31 DIAGNOSIS — R918 Other nonspecific abnormal finding of lung field: Principal | ICD-10-CM

## 2022-03-31 DIAGNOSIS — R0600 Dyspnea, unspecified: Principal | ICD-10-CM

## 2022-03-31 LAB — IGG: GAMMAGLOBULIN; IGG: 1495 mg/dL (ref 646–2013)

## 2022-03-31 LAB — CK: CREATINE KINASE TOTAL: 40 U/L

## 2022-03-31 NOTE — Unmapped (Addendum)
--   Labs today   -- Return in 3 months for repeat breathing test and sooner if worsening shortness of breath  -- Referral to Pulmonary Rehab in [facility]     Thank you for allowing me to be a part of your care. Please call the clinic with any questions.     Lequita Asal MD   Pulmonary and Critical Care Medicine  [place], [place]      Thank you for your visit to the [facility]. You may receive a survey from [facility] regarding your visit today, and we are eager to use this feedback to improve your experience and provide outstanding care. Thank you for taking the time to fill it out.     Between appointments, you can reach [place] at these numbers:     For clinical questions: ILD nurse coordinator Haze Justin [phone]  For appointments: [phone]  Fax: [phone]

## 2022-03-31 NOTE — Unmapped (Signed)
The etiology of the patient's Interstitial Lung Disease is unknown. The differential diagnosis is broad and includes idiopathic interstitial pneumonias (IIP), connective tissue associated ILD (CT-ILD), organic antigen exposures leading to chronic hypersensitivity pneumonitis, occupational exposures or pneumoconioses, medication exposures and other rarer diseases. Based upon the patient's history, exam, and diagnostic studies to date, I favor a diagnosis of Idiopathic interstitial pneumonia (IPF >NSIP> smoking related ILD- though slow progressing), an antisynthetase syndrome (+ SRM ab)     - get a few more lab tests -ck,aldolase, IgG, ANCA  - repeat PFTs in 3 months to follow pace of illness  - refer to pulm rehab  - consider other treatment based upon new data

## 2022-03-31 NOTE — Unmapped (Signed)
-   patient will need repeat chest CT in 6 months to follow up slowly growing nodule in RML

## 2022-04-01 LAB — ANTI-NEUTROPHILIC CYTOPLASMIC ANTIBODY
ANCA IFA: NEGATIVE
MPO-ELISA: NEGATIVE
MPO-QUANT: 2.1 U/mL (ref <21.0)
PR3 ELISA: NEGATIVE
PR3-QUANT: 0.8 U/mL (ref <21.0)

## 2022-04-01 NOTE — Unmapped (Signed)
Pulm rehab referral sent to [facility] at [facility].

## 2022-04-02 LAB — ALDOLASE: ALDOLASE: 4.1 U/L

## 2022-04-09 NOTE — Unmapped (Signed)
[facility] Refill Coordination Note    Specialty Medication(s) to be Shipped:   General Specialty: Repatha    Other medication(s) to be shipped: No additional medications requested for fill at this time     Amber Duncan, DOB: [birth year]  Phone: [phone] (home)       All above HIPAA information was verified with patient.     Was a Nurse, learning disability used for this call? No    Completed refill call assessment today to schedule patient's medication shipment from the [facility] [phone]).  All relevant notes have been reviewed.     Specialty medication(s) and dose(s) confirmed: Regimen is correct and unchanged.   Changes to medications: Ly reports no changes at this time.  Changes to insurance: No  New side effects reported not previously addressed with a pharmacist or physician: None reported  Questions for the pharmacist: No    Confirmed patient received a Conservation officer, historic buildings and a Surveyor, mining with first shipment. The patient will receive a drug information handout for each medication shipped and additional FDA Medication Guides as required.       DISEASE/MEDICATION-SPECIFIC INFORMATION        For patients on injectable medications: Patient currently has 0 doses left.  Next injection is scheduled for 04/16/22.    SPECIALTY MEDICATION ADHERENCE     Medication Adherence    Patient reported X missed doses in the last month: 0  Specialty Medication: Repatha 140mg /ml  Patient is on additional specialty medications: No  Patient is on more than two specialty medications: No              Were doses missed due to medication being on hold? No    Repatha 140 mg/ml: 0 days of medicine on hand       REFERRAL TO PHARMACIST     Referral to the pharmacist: Not needed      [facility]     Shipping address confirmed in Epic.     Delivery Scheduled: Yes, Expected medication delivery date: 04/13/22.     Medication will be delivered via Same Day Courier to the prescription address in Epic WAM.    Nancy Nordmann [facility] Specialty Technician

## 2022-04-13 MED ORDER — CLONAZEPAM 0.5 MG TABLET
ORAL_TABLET | 0 refills | 0 days | Status: CP
Start: 2022-04-13 — End: ?

## 2022-04-13 MED FILL — REPATHA SURECLICK 140 MG/ML SUBCUTANEOUS PEN INJECTOR: SUBCUTANEOUS | 28 days supply | Qty: 2 | Fill #8

## 2022-04-20 NOTE — Unmapped (Signed)
Assessment/Plan:    Diagnoses and all orders for this visit:    Acute UTI  -     POCT urinalysis dipstick  -     ciprofloxacin HCl (CIPRO) 250 MG tablet; Take 1 tablet (250 mg total) by mouth Two (2) times a day for 5 days.  -     Urine Culture; Future  -     Urine Culture    Hypothyroidism, unspecified type  -     SYNTHROID 100 mcg tablet; Take 1 tablet (100 mcg total) by mouth daily.    Chronic fatigue  -     Vitamin B12 Level; Future  -     Vitamin B12 Level    Hx of iron deficiency anemia  -     CBC  -     Iron; Future  -     Ferritin; Future  -     Iron  -     Ferritin    Essential hypertension  -     lisinopriL (PRINIVIL,ZESTRIL) 20 MG tablet; Take 1 tablet (20 mg total) by mouth daily.    Dysphonia spastica    Chronic UTI    Mixed hyperlipidemia    Malignant neoplasm of lower-inner quadrant of left breast in female, estrogen receptor positive (CMS-HCC)    Depression, unspecified depression type    Lung nodules    Chronic obstructive pulmonary disease, unspecified COPD type (CMS-HCC)    Type 2 diabetes with complication (CMS-HCC)  -     Albumin/creatinine urine ratio    ILD (interstitial lung disease) (CMS-HCC)    Vitamin D deficiency  -     Vitamin D 25 Hydroxy (25OH D2 + D3)      -Diabetes mellitus with complications  CMP done in 10/2021 revealed normal serum electrolytes, creatinine 1.0, glucose of 141 and normal LFTs.   DM has been well controlled. Continue metformin 1000 mg BID and diabetic diet.  Repeat HgA1c in May/2023 was 5.4.    -CAD/dyslipidemia: she is followed by [facility] cardiology and last seen in 09/2021 and encounter details reviewed with the pt today.    She continues on Repatha and low dose pravastatin as previously directed.  Her LDL cholesterol was 67 in 16/1096 and LFT's were normal in 10/2021. She will see cardiology in 1 yr. She is due for annual lipid panel today. d     -left sided Breast cancer history: infiltrating ductal carcinoma, estrogen receptor positive, progesterone receptor positive and HER2 negative s/p lumpectomy in 01/2020 and follow up radiation Rx completed in 02/2020.   She is   Followed annually by breast oncology, seen early this year. She had a normal  mammogram and breast US in 09/2021. She will follow up in 1 yrs time.     -Dysuria with history of chronic UTIs and microscopic hematuria   She was referred to [facility] urology and she was seen in  04/2021 and advised to follow up in 3 months time/she has not followed up as recommended.  She was started on macrodantin 100 mg daily for UTI prevention, but stopped due to side effects. She had cystoscopy last year which was normal. She was to start estrace vaginal cream twice a week and  Consider low dose daily keflex instead. She had UTI early this year.  She is started on Keflex 250 mg tab daily for UTI prevention.  She will continue daily D Mannose and cranberry tablet, regimen which has helped as well.  [place] the beach  and she left her keflex Rx and she developed dysuria and urinary frequency for the past several days.  She feels like she has a UTI.  Her urine dipstick today is positive for nit and leuk.  I will obtain a urine culture today. She is started on cipro 250 mg bid for 5 days. She will then resume low dose daily keflex regimen as previously directed.  She states that she usually does not have a UTI if she takes Keflex daily.  Defer urology follow up for the time being.       -History of anemia - chronic iron def anemia  The pt is followed by [facility] hematology and last visit was in 10/2021 and  encounter details reviewed with the pt today.  She was advised to re establish care with [facility]. She has a history of AVM's with intermittent bleeding.  She has had several IV infusions ( a total of 4 infusions in 19/2022).  CBC done in May/2023 revealed H/H of 11/33.7 with MCV of 100.  Normal white blood cell and platelet count.    She had GI bleed in 10/2019 and EGD and colonoscopy were done at that time.  Bleeding was attributed to blood thinner and AVMs.  She is on low dose daily enteric aspirin instead, as per cardiology recommendation.  She was seen by GI in June/2023.  See below for details.    - chronic fatigue symptoms: possibly attributed to chronic anemia. Her next hematology visit is not until 07/2022. I will repeat a CBC today along with iron studies along with Vit D and Vit B 12 levels.     -History of AVM related GI bleeding: First noted in 2018.  Patient was seen by GI in June/2023 and encounter details reviewed with the patient today.  He was advised that the patient continue iron infusion as needed as opposed to repeat endoscopy given her recurrent nature of AVMs.  She was advised to continue follow-up with hematology for iron infusions.  She was advised to continue p.o. iron as well.      -Hypothyroidism: pt continues on levothyroxine supplementation as directed. Her TSH was normal in 01/2022.    -Hypertension:  Patient remains compliant with her current medication regimen.  She has been counseled on maintaining low-salt and low-fat dietary intake and exercise routine.    CMP done 10/2021 revealed creatinine level of 1 with normal serum electrolytes and LFT's.    - Cervical level spinal myoclonus, Dysphonia spastica and migraine headache history:  She is followed by [facility] neurology and last seen in 11/2021 and encounter details reviewed with the pt today. The following plan was discussed:  Plan  Patient Instructions   No changes to your medications today  If this current worsening continues for more than another 2 weeks, please let me know and we will plan to increase the dose of one of the medications  I agree with re-checking iron levels as your dysphonia tends to get worse when your iron levels are low  We discussed that you are not interested in resuscitation or feeding tubes, I will make sure to document this in your chart.  Follow up in 6 months time       Iron and ferritin levels were normal in 10/2021 and CBC revealed stable H/H of 12/35.      She has an neurology follow up visit in 07/2022.   She had Klonopin dose increased on last visit.  This is contributing to her  chronic fatigue symptoms.       - Prior history of depression with recent recurrence on last visit: Pt on  Prozac 10 mg daily/re started on our last visit:   she is still having ongoing struggles. She feels nervous all the time.  She wishes to increase dose for this reason. I recommended an increase in dose to 20 mg daily    - history of COPD/ILD and pulmonary nodules: pt is part of a lung cancer screening study and she had a recent surveillance chest CT recently which revealed increase in lung nodule size and she is referred to [facility] pulmonary clinic for further evaluation.  Was seen by pulmonary in July/2023.  The following is noted from that encounter:   Assessment/Plan  ILD (interstitial lung disease) (CMS-HCC)  The etiology of the patient's Interstitial Lung Disease is unknown. The differential diagnosis is broad and includes idiopathic interstitial pneumonias (IIP), connective tissue associated ILD (CT-ILD), organic antigen exposures leading to chronic hypersensitivity pneumonitis, occupational exposures or pneumoconioses, medication exposures and other rarer diseases. 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  She is currently on no inhaler regimen for this condition.   They are considering pulmonary rehab. At [facility].          I personally spent 40 minutes, face to face, and non face to face, in the care of this patient, which includes pre , intra and post visit time on date of service.  All documented time is specific to EM and dose not include any procedures performed today.    Return in about 4 months (around 08/28/2022) for Recheck.    Subjective:     HPI  Patient is seen today for 21-month follow-up visit.  Last saw the patient May/2023 with the following medical conditions:    -Diabetes mellitus with complications  CMP done in 10/2021 revealed normal serum electrolytes, creatinine 1.0, glucose of 141 and normal LFTs.   DM has been well controlled. Continue metformin 1000 mg BID and diabetic diet.  Repeat HgA1c in May/2023 was 5.4.    -CAD/dyslipidemia: she is followed by [facility] cardiology and last seen in 09/2021 and encounter details reviewed with the pt today.    She continues on Repatha and low dose pravastatin as previously directed.  Her LDL cholesterol was 67 in 16/1096 and LFT's were normal in 10/2021. She will see cardiology in 1 yr. She is due for annual lipid panel today. d     -left sided Breast cancer history: infiltrating ductal carcinoma, estrogen receptor positive, progesterone receptor positive and HER2 negative s/p lumpectomy in 01/2020 and follow up radiation Rx completed in 02/2020.   She is   Followed annually by breast oncology, seen early this year. She had a normal  mammogram and breast US in 09/2021. She will follow up in 1 yrs time.     -Dysuria with history of chronic UTIs and microscopic hematuria   She was  referred to [facility] urology and she was seen in  04/2021 and advised to follow up in 3 months time/she has not followed up as recommended.  She was started on macrodantin 100 mg daily for UTI prevention, but stopped due to side effects. She had cystoscopy last year which was normal. She was to start estrace vaginal cream twice a week and  Consider low dose daily keflex instead. She had UTI early this year.  She is started on Keflex 250 mg tab daily for UTI prevention.  She will continue daily D Mannose and cranberry tablet, regimen which has helped as well.  Pt was at the beach and she left her keflex Rx and she developed dysuria and urinary frequency for the past several days.  She feels like she has a UTI.  Her urine dipstick today is positive for nit and leuk.  I will obtain a urine culture today. She is started on cipro 250 mg bid for 5 days. She will then resume low dose daily keflex regimen as previously directed.  She states that she usually does not have a UTI if she takes Keflex daily.  Defer urology follow up for the time being.       -History of anemia - chronic iron def anemia  The pt is followed by [facility] hematology and last visit was in 10/2021 and  encounter details reviewed with the pt today.  She was advised to re establish care with [facility] GI. She has a history of AVM's with intermittent bleeding.  She has had several IV infusions ( a total of 4 infusions in 19/2022).  CBC done in May/2023 revealed H/H of 11/33.7 with MCV of 100.  Normal white blood cell and platelet count.    She had GI bleed in 10/2019 and EGD and colonoscopy were done at that time.  Bleeding was attributed to blood thinner and AVMs.  She is on low dose daily enteric aspirin instead, as per cardiology recommendation.  She was seen by GI in June/2023.  See below for details.    - chronic fatigue symptoms: possibly attributed to chronic anemia. Her next hematology visit is not until 07/2022. I will repeat a CBC today along with iron studies along with Vit D and Vit B 12 levels.     -History of AVM related GI bleeding: First noted in 2018.  Patient was seen by GI in June/2023 and encounter details reviewed with the patient today.  He was advised that the patient continue iron infusion as needed as opposed to repeat endoscopy given her recurrent nature of AVMs.  She was advised to continue follow-up with hematology for iron infusions.  She was advised to continue p.o. iron as well.      -Hypothyroidism: pt continues on levothyroxine supplementation as directed. Her TSH was normal in 01/2022.    -Hypertension:  Patient remains compliant with her current medication regimen.  She has been counseled on maintaining low-salt and low-fat dietary intake and exercise routine.    CMP done 10/2021 revealed creatinine level of 1 with normal serum electrolytes and LFT's.    - Cervical level spinal myoclonus, Dysphonia spastica and migraine headache history:  She is followed by [facility] neurology and last seen in 11/2021 and encounter details reviewed with the pt today. The following plan was discussed:  Plan  Patient Instructions   No changes to your medications today  If this current worsening continues for more than another 2 weeks, please let me know and we will  plan to increase the dose of one of the medications  I agree with re-checking iron levels as your dysphonia tends to get worse when your iron levels are low  We discussed that you are not interested in resuscitation or feeding tubes, I will make sure to document this in your chart.  Follow up in 6 months time       Iron and ferritin levels were normal in 10/2021 and CBC revealed stable H/H of 12/35.      She has an neurology follow up visit in 07/2022.   She had Klonopin dose increased on last visit.  This is contributing to her chronic fatigue symptoms.       - Prior history of depression with recent recurrence on last visit: Pt on  Prozac 10 mg daily/re started on our last visit:   she is still having ongoing struggles. She feels nervous all the time.  She wishes to increase dose for this reason. I recommended an increase in dose to 20 mg daily    - history of COPD/ILD and pulmonary nodules: pt is part of a lung cancer screening study and she had a recent surveillance chest CT recently which revealed increase in lung nodule size and she is referred to [facility] pulmonary clinic for further evaluation.  Was seen by pulmonary in July/2023.  The following is noted from that encounter:   Assessment/Plan  ILD (interstitial lung disease) (CMS-HCC)  The etiology of the patient's Interstitial Lung Disease is unknown. The differential diagnosis is broad and includes idiopathic interstitial pneumonias (IIP), connective tissue associated ILD (CT-ILD), organic antigen exposures leading to chronic hypersensitivity pneumonitis, occupational exposures or pneumoconioses, medication exposures and other rarer diseases. 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  She is currently on no inhaler regimen for this condition.   They are considering pulmonary rehab. At [facility].            ROS  Constitutional:  Denies  unexpected weight loss or gain, or weakness   Eyes:  Denies visual changes  Respiratory:  Denies cough or shortness of breath. No change in exercise  tolerance  Cardiovascular:  Denies chest pain, palpitations or lower extremity swelling   GI:  Denies abdominal pain, diarrhea, constipation   Musculoskeletal:  Denies myalgias  Skin:  Denies nonhealing lesions  Neurologic:  Denies headache, focal weakness or numbness, tingling  Endocrine:  Denies polyuria or polydypsia   Psychiatric:  Denies depression, anxiety      Outpatient Medications Prior to Visit   Medication Sig Dispense Refill    aspirin-calcium carbonate 81 mg-300 mg calcium(777 mg) Tab Take 81 mg by mouth.      cephalexin (KEFLEX) 250 MG capsule Take 1 capsule daily for UTI prevention 30 capsule 11 clonazePAM (KLONOPIN) 0.5 MG tablet TAKE UP TO 2 TABLETS TWICE DAILY AS DIRECTED 180 tablet 0    d-mannose 500 mg cap Take 1,500 mg by mouth daily.  90 capsule 11    docusate sodium (COLACE) 100 MG capsule Take 1 capsule (100 mg total) by mouth daily as needed.      empty container (SHARPS-A-GATOR DISPOSAL SYSTEM) Misc Use as directed for sharps disposal 1 each 2    evolocumab (REPATHA SURECLICK) 140 mg/mL PnIj Inject the contents of 1 pen (140 mg) under the skin every fourteen (14) days. 2 mL 11    ferrous gluconate 324 MG tablet Take 1 tablet (324 mg total) by mouth once.      FLUoxetine (PROZAC) 20 MG capsule Take 1 capsule (20 mg total) by mouth daily. 90 capsule 3    levETIRAcetam (KEPPRA) 1000 MG tablet TAKE 1 TABLET BY MOUTH 2 TIMES DAILY 180 tablet 3    metFORMIN (GLUCOPHAGE) 500 MG tablet Take 2 tablets (1,000 mg total) by mouth in the morning and 2 tablets (1,000 mg total) in the evening. Take with meals. 360 tablet 1    omeprazole 20 mg TbLD Take by mouth nightly as needed.      pravastatin (PRAVACHOL) 10 MG tablet TAKE 0.5 TABLET BY MOUTH DAILY. 45 tablet 2    lisinopriL (PRINIVIL,ZESTRIL) 20 MG tablet TAKE 1 TABLET BY MOUTH DAILY. 90 tablet 2    SYNTHROID 100 mcg tablet Take 1 tablet (100 mcg total) by mouth daily. 90 tablet 3     No facility-administered medications prior to visit.         Objective:       Vital Signs  BP 144/86 (BP Site: R Arm, BP Position: Sitting, BP Cuff Size: Medium)  - Pulse 88  - Temp 35.9 ??C (96.7 ??F) (Temporal)  - Ht 167.6 cm (5' 5.98)  - Wt 86.7 kg (191 lb 3.2 oz)  - SpO2 96%  - BMI 30.88 kg/m??      Exam  General: normal appearance  EYES: Anicteric sclerae.  ENT: Oropharynx moist.  RESP: Relaxed respiratory effort. Clear to auscultation without wheezes or crackles.   CV: Regular rate and rhythm. Normal S1 and S2. No murmurs or gallops.  No lower extremity edema. Posterior tibial pulses are 2+ and symmetric.  abd exam: non tender, no masses, no HSM   MSK: No focal muscle tenderness.  SKIN: Appropriately warm and moist.  NEURO: Stable gait and coordination.    Allergies:     Patient has no known allergies.    Current Medications:     Current Outpatient Medications   Medication Sig Dispense Refill    aspirin-calcium carbonate 81 mg-300 mg calcium(777 mg) Tab Take 81 mg by mouth.      cephalexin (KEFLEX) 250 MG capsule Take 1 capsule daily for UTI prevention 30 capsule 11    clonazePAM (KLONOPIN) 0.5 MG tablet TAKE UP TO 2 TABLETS TWICE DAILY AS DIRECTED 180 tablet 0    d-mannose 500 mg cap Take 1,500 mg by mouth daily. 90 capsule 11    docusate sodium (COLACE) 100 MG capsule Take 1 capsule (100 mg total) by mouth daily as needed.      empty container (SHARPS-A-GATOR DISPOSAL SYSTEM) Misc Use as directed for sharps disposal 1 each 2    evolocumab (REPATHA SURECLICK) 140 mg/mL PnIj Inject the contents of 1 pen (140 mg) under the skin every fourteen (14) days. 2 mL 11    ferrous gluconate 324 MG tablet Take 1 tablet (324 mg total) by mouth once.      FLUoxetine (PROZAC) 20 MG capsule Take 1 capsule (20 mg total) by mouth daily. 90  capsule 3    levETIRAcetam (KEPPRA) 1000 MG tablet TAKE 1 TABLET BY MOUTH 2 TIMES DAILY 180 tablet 3    metFORMIN (GLUCOPHAGE) 500 MG tablet Take 2 tablets (1,000 mg total) by mouth in the morning and 2 tablets (1,000 mg total) in the evening. Take with meals. 360 tablet 1    omeprazole 20 mg TbLD Take by mouth nightly as needed.      pravastatin (PRAVACHOL) 10 MG tablet TAKE 0.5 TABLET BY MOUTH DAILY. 45 tablet 2    ciprofloxacin HCl (CIPRO) 250 MG tablet Take 1 tablet (250 mg total) by mouth Two (2) times a day for 5 days. 10 tablet 0    lisinopriL (PRINIVIL,ZESTRIL) 20 MG tablet Take 1 tablet (20 mg total) by mouth daily. 90 tablet 3    SYNTHROID 100 mcg tablet Take 1 tablet (100 mcg total) by mouth daily. 90 tablet 3     No current facility-administered medications for this visit.           Note - This record has been created using [employer]. Chart creation errors have been sought, but may not always have been located. Such creation errors do not reflect on the standard of medical care.    [name], MD

## 2022-04-24 MED ORDER — LISINOPRIL 20 MG TABLET
ORAL_TABLET | Freq: Every day | ORAL | 2 refills | 90 days
Start: 2022-04-24 — End: ?

## 2022-04-24 MED ORDER — SYNTHROID 100 MCG TABLET
ORAL_TABLET | Freq: Every day | ORAL | 3 refills | 90 days
Start: 2022-04-24 — End: 2023-04-24

## 2022-04-28 ENCOUNTER — Ambulatory Visit: Admit: 2022-04-28 | Discharge: 2022-04-29 | Payer: PRIVATE HEALTH INSURANCE

## 2022-04-28 DIAGNOSIS — E559 Vitamin D deficiency, unspecified: Principal | ICD-10-CM

## 2022-04-28 DIAGNOSIS — E039 Hypothyroidism, unspecified: Principal | ICD-10-CM

## 2022-04-28 DIAGNOSIS — R918 Other nonspecific abnormal finding of lung field: Principal | ICD-10-CM

## 2022-04-28 DIAGNOSIS — F32A Depression, unspecified depression type: Principal | ICD-10-CM

## 2022-04-28 DIAGNOSIS — E118 Type 2 diabetes mellitus with unspecified complications: Principal | ICD-10-CM

## 2022-04-28 DIAGNOSIS — J449 Chronic obstructive pulmonary disease, unspecified: Principal | ICD-10-CM

## 2022-04-28 DIAGNOSIS — Z862 Personal history of diseases of the blood and blood-forming organs and certain disorders involving the immune mechanism: Principal | ICD-10-CM

## 2022-04-28 DIAGNOSIS — I10 Essential (primary) hypertension: Principal | ICD-10-CM

## 2022-04-28 DIAGNOSIS — N390 Urinary tract infection, site not specified: Principal | ICD-10-CM

## 2022-04-28 DIAGNOSIS — C50312 Malignant neoplasm of lower-inner quadrant of left female breast: Principal | ICD-10-CM

## 2022-04-28 DIAGNOSIS — Z170 Estrogen receptor positive status [ER+]: Principal | ICD-10-CM

## 2022-04-28 DIAGNOSIS — J849 Interstitial pulmonary disease, unspecified: Principal | ICD-10-CM

## 2022-04-28 DIAGNOSIS — J383 Other diseases of vocal cords: Principal | ICD-10-CM

## 2022-04-28 DIAGNOSIS — E782 Mixed hyperlipidemia: Principal | ICD-10-CM

## 2022-04-28 DIAGNOSIS — R5382 Chronic fatigue, unspecified: Principal | ICD-10-CM

## 2022-04-28 LAB — CBC
HEMATOCRIT: 37.3 % (ref 34.0–44.0)
HEMOGLOBIN: 12.7 g/dL (ref 11.3–14.9)
MEAN CORPUSCULAR HEMOGLOBIN CONC: 34.1 g/dL (ref 32.0–36.0)
MEAN CORPUSCULAR HEMOGLOBIN: 33.7 pg — ABNORMAL HIGH (ref 25.9–32.4)
MEAN CORPUSCULAR VOLUME: 98.7 fL — ABNORMAL HIGH (ref 77.6–95.7)
MEAN PLATELET VOLUME: 8.2 fL (ref 6.8–10.7)
PLATELET COUNT: 271 10*9/L (ref 150–450)
RED BLOOD CELL COUNT: 3.78 10*12/L — ABNORMAL LOW (ref 3.95–5.13)
RED CELL DISTRIBUTION WIDTH: 12.7 % (ref 12.2–15.2)
WBC ADJUSTED: 7.1 10*9/L (ref 3.6–11.2)

## 2022-04-28 LAB — IRON: IRON: 141 ug/dL

## 2022-04-28 LAB — VITAMIN B12: VITAMIN B-12: 425 pg/mL (ref 211–911)

## 2022-04-28 LAB — FERRITIN: FERRITIN: 218.1 ng/mL

## 2022-04-28 LAB — ALBUMIN / CREATININE URINE RATIO
ALBUMIN QUANT URINE: 12.3 mg/dL
ALBUMIN/CREATININE RATIO: 178.5 ug/mg — ABNORMAL HIGH (ref 0.0–30.0)
CREATININE, URINE: 68.9 mg/dL

## 2022-04-28 MED ORDER — SYNTHROID 100 MCG TABLET
ORAL_TABLET | Freq: Every day | ORAL | 3 refills | 90.00000 days | Status: CP
Start: 2022-04-28 — End: 2023-04-28

## 2022-04-28 MED ORDER — CIPROFLOXACIN 250 MG TABLET
ORAL_TABLET | Freq: Two times a day (BID) | ORAL | 0 refills | 5.00000 days | Status: CP
Start: 2022-04-28 — End: 2022-05-03

## 2022-04-28 MED ORDER — LISINOPRIL 20 MG TABLET
ORAL_TABLET | Freq: Every day | ORAL | 3 refills | 90 days | Status: CP
Start: 2022-04-28 — End: ?

## 2022-04-28 NOTE — Unmapped (Signed)
CBC reveals no anemia. MCV is mildly elevated at 98 but iron studies and Vit B 12 levels are normal. Urine micro albumin result is elevated. Pt is on lisinopril with offers renal protection with history of diabetes.

## 2022-04-30 DIAGNOSIS — R918 Other nonspecific abnormal finding of lung field: Principal | ICD-10-CM

## 2022-04-30 LAB — VITAMIN D 25 HYDROXY: VITAMIN D, TOTAL (25OH): 35.2 ng/mL (ref 20.0–80.0)

## 2022-04-30 NOTE — Unmapped (Signed)
Called pt to schedule ct and follow up, nobody picked up, left VM    -Rashad B.

## 2022-04-30 NOTE — Unmapped (Signed)
E coli bacterial is susceptible to Cipro .

## 2022-04-30 NOTE — Unmapped (Signed)
E. coli growth noted on urine culture.  Patient is on ciprofloxacin to take as directed.

## 2022-05-01 NOTE — Unmapped (Signed)
Called pt to schedule ct and follow up, the pt was scheduled and the appts was confirmed the pt.    -Rashad B.

## 2022-05-01 NOTE — Unmapped (Signed)
Normal Vit D level

## 2022-05-08 NOTE — Unmapped (Signed)
[facility] Refill Coordination Note    Specialty Medication(s) to be Shipped:   General Specialty: Repatha    Other medication(s) to be shipped: No additional medications requested for fill at this time     Amber Duncan, DOB: 01-Jan-1951  Phone: [phone] (home)       All above HIPAA information was verified with patient.     Was a Nurse, learning disability used for this call? No    Completed refill call assessment today to schedule patient's medication shipment from the [facility] [phone]).  All relevant notes have been reviewed.     Specialty medication(s) and dose(s) confirmed: Regimen is correct and unchanged.   Changes to medications: Jamil reports no changes at this time.  Changes to insurance: No  New side effects reported not previously addressed with a pharmacist or physician: None reported  Questions for the pharmacist: No    Confirmed patient received a Conservation officer, historic buildings and a Surveyor, mining with first shipment. The patient will receive a drug information handout for each medication shipped and additional FDA Medication Guides as required.       DISEASE/MEDICATION-SPECIFIC INFORMATION        For patients on injectable medications: Patient currently has 0 doses left.  Next injection is scheduled for 05/14/22.    SPECIALTY MEDICATION ADHERENCE     Medication Adherence    Patient reported X missed doses in the last month: 0  Specialty Medication: Repatha 140mg /ml  Patient is on additional specialty medications: No  Patient is on more than two specialty medications: No                          Were doses missed due to medication being on hold? No    Repatha 140 mg/ml: 0 days of medicine on hand       REFERRAL TO PHARMACIST     Referral to the pharmacist: Not needed      [facility]     Shipping address confirmed in Epic.     Delivery Scheduled: Yes, Expected medication delivery date: 05/13/22.     Medication will be delivered via Same Day Courier to the prescription address in Epic WAM.    Nancy Nordmann [facility] Specialty Technician

## 2022-05-13 MED FILL — REPATHA SURECLICK 140 MG/ML SUBCUTANEOUS PEN INJECTOR: SUBCUTANEOUS | 28 days supply | Qty: 2 | Fill #9

## 2022-06-04 NOTE — Unmapped (Signed)
[facility] Clinical Assessment & Refill Coordination Note    Amber Duncan, DOB: [birth year]  Phone: [phone] (home)     All above HIPAA information was verified with patient.     Was a Nurse, learning disability used for this call? No    Specialty Medication(s):   General Specialty: Repatha     Current Outpatient Medications   Medication Sig Dispense Refill    aspirin-calcium carbonate 81 mg-300 mg calcium(777 mg) Tab Take 81 mg by mouth.      cephalexin (KEFLEX) 250 MG capsule Take 1 capsule daily for UTI prevention 30 capsule 11    clonazePAM (KLONOPIN) 0.5 MG tablet TAKE UP TO 2 TABLETS TWICE DAILY AS DIRECTED 180 tablet 0    d-mannose 500 mg cap Take 1,500 mg by mouth daily. 90 capsule 11    docusate sodium (COLACE) 100 MG capsule Take 1 capsule (100 mg total) by mouth daily as needed.      empty container (SHARPS-A-GATOR DISPOSAL SYSTEM) Misc Use as directed for sharps disposal 1 each 2    evolocumab (REPATHA SURECLICK) 140 mg/mL PnIj Inject the contents of 1 pen (140 mg) under the skin every fourteen (14) days. 2 mL 11    ferrous gluconate 324 MG tablet Take 1 tablet (324 mg total) by mouth once.      FLUoxetine (PROZAC) 20 MG capsule Take 1 capsule (20 mg total) by mouth daily. 90 capsule 3    levETIRAcetam (KEPPRA) 1000 MG tablet TAKE 1 TABLET BY MOUTH 2 TIMES DAILY 180 tablet 3    lisinopriL (PRINIVIL,ZESTRIL) 20 MG tablet Take 1 tablet (20 mg total) by mouth daily. 90 tablet 3    metFORMIN (GLUCOPHAGE) 500 MG tablet Take 2 tablets (1,000 mg total) by mouth in the morning and 2 tablets (1,000 mg total) in the evening. Take with meals. 360 tablet 1    omeprazole 20 mg TbLD Take by mouth nightly as needed.      pravastatin (PRAVACHOL) 10 MG tablet TAKE 0.5 TABLET BY MOUTH DAILY. 45 tablet 2    SYNTHROID 100 mcg tablet Take 1 tablet (100 mcg total) by mouth daily. 90 tablet 3     No current facility-administered medications for this visit.        Changes to medications: Amber Duncan reports no changes at this time.    No Known Allergies    Changes to allergies: No    SPECIALTY MEDICATION ADHERENCE     Repatha 140 mg/ml: 7 days of medicine on hand       Medication Adherence    Patient reported X missed doses in the last month: 0  Specialty Medication: Repatha 140mg /mL  Informant: patient                            Specialty medication(s) dose(s) confirmed: Regimen is correct and unchanged.     Are there any concerns with adherence? No    Adherence counseling provided? Not needed    CLINICAL MANAGEMENT AND INTERVENTION      Clinical Benefit Assessment:    Do you feel the medicine is effective or helping your condition? Yes    Clinical Benefit counseling provided? Not needed    Adverse Effects Assessment:    Are you experiencing any side effects? No    Are you experiencing difficulty administering your medicine? No    Quality of Life Assessment:    Quality of Life    Rheumatology  Oncology  Dermatology  Cystic Fibrosis          How many days over the past month did your CAD  keep you from your normal activities? For example, brushing your teeth or getting up in the morning. Patient declined to answer    Have you discussed this with your provider? Not needed    Acute Infection Status:    Acute infections noted within Epic:  No active infections  Patient reported infection: None    Therapy Appropriateness:    Is therapy appropriate and patient progressing towards therapeutic goals? Yes, therapy is appropriate and should be continued    DISEASE/MEDICATION-SPECIFIC INFORMATION      For patients on injectable medications: Patient currently has 0 doses left.  Next injection is scheduled for 10/5.    PATIENT SPECIFIC NEEDS     Does the patient have any physical, cognitive, or cultural barriers? No    Is the patient high risk? No    Does the patient require a Care Management Plan? No     SOCIAL DETERMINANTS OF HEALTH     At the [facility], we have learned that life circumstances - like trouble affording food, housing, utilities, or transportation can affect the health of many of our patients.   That is why we wanted to ask: are you currently experiencing any life circumstances that are negatively impacting your health and/or quality of life? Patient declined to answer    Social Determinants of Health     Financial Resource Strain: Medium Risk (11/19/2020)    Overall Financial Resource Strain (CARDIA)     Difficulty of Paying Living Expenses: Somewhat hard   Internet Connectivity: Not on file   Food Insecurity: Food Insecurity Present (11/19/2020)    Hunger Vital Sign     Worried About Running Out of Food in the Last Year: Sometimes true     Ran Out of Food in the Last Year: Never true   Tobacco Use: Medium Risk (04/28/2022)    Patient History     Smoking Tobacco Use: Former     Smokeless Tobacco Use: Never     Passive Exposure: Past   Housing/Utilities: Unknown (11/20/2021)    Housing/Utilities     Within the past 12 months, have you ever stayed: outside, in a car, in a tent, in an overnight shelter, or temporarily in someone else's home (i.e. couch-surfing)?: No     Are you worried about losing your housing?: Not on file     Within the past 12 months, have you been unable to get utilities (heat, electricity) when it was really needed?: Not on file   Alcohol Use: Not At Risk (10/11/2020)    Alcohol Use     How often do you have a drink containing alcohol?: Monthly or less     How many drinks containing alcohol do you have on a typical day when you are drinking?: 1 - 2     How often do you have 5 or more drinks on one occasion?: Never   Transportation Needs: No Transportation Needs (06/17/2021)    PRAPARE - Transportation     Lack of Transportation (Medical): No     Lack of Transportation (Non-Medical): No   Substance Use: Not on file   Health Literacy: Low Risk  (12/14/2020)    Health Literacy     : Never   Physical Activity: Not on file   Interpersonal Safety: Not on file   Stress: Not on file  Intimate Partner Violence: Not on file   Depression: At risk (07/15/2021)    PHQ-2     PHQ-2 Score: 4   Social Connections: Not on file       Would you be willing to receive help with any of the needs that you have identified today? Not applicable       SHIPPING     Specialty Medication(s) to be Shipped:   General Specialty: Repatha    Other medication(s) to be shipped: No additional medications requested for fill at this time     Changes to insurance: No    Delivery Scheduled: Yes, Expected medication delivery date: 06/09/22.     Medication will be delivered via Same Day Courier to the confirmed prescription address in [facility].    The patient will receive a drug information handout for each medication shipped and additional FDA Medication Guides as required.  Verified that patient has previously received a Conservation officer, historic buildings and a Surveyor, mining.    The patient or caregiver noted above participated in the development of this care plan and knows that they can request review of or adjustments to the care plan at any time.      All of the patient's questions and concerns have been addressed.    [name], PharmD   [facility] Specialty Pharmacist

## 2022-06-09 MED FILL — REPATHA SURECLICK 140 MG/ML SUBCUTANEOUS PEN INJECTOR: SUBCUTANEOUS | 28 days supply | Qty: 2 | Fill #10

## 2022-06-10 MED ORDER — CLONAZEPAM 0.5 MG TABLET
ORAL_TABLET | 0 refills | 0 days
Start: 2022-06-10 — End: ?

## 2022-06-16 MED ORDER — CLONAZEPAM 0.5 MG TABLET
ORAL_TABLET | 0 refills | 0 days | Status: CP
Start: 2022-06-16 — End: ?

## 2022-06-22 NOTE — Unmapped (Signed)
[address]  [facility] [place] [address] [place]  [address]  [place] [address]  [phone]    Date: 06/23/2022  Patient Name: Amber Duncan  MRN: [id number]  PCP: Loree Fee, MD    Assessment:      Amber Duncan is a 71 y.o. female who  has a past medical history of Breast cancer (CMS-HCC), Chronic UTI, COPD (chronic obstructive pulmonary disease) (CMS-HCC), Coronary artery disease, Diabetes (CMS-HCC), Disease of thyroid gland, Fatigue, Hepatitis, Hepatitis, Hyperlipidemia, Hypertension, Migraine, and Sleep apnea. 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     Facial trauma: I referred her to ER due to concern for orbital fracture and facial nerve injury as well as eye injury. I called the ER and spoke with the charge nurse to give sign out before she arrives.    Cervical level Spinal myoclonus: She feels she has not been doing as well over past couple weeks again, but examination today is significantly improved. I worry that Clonazepam may be contributing to subjective weakness, fatigue, and balance impairment so we will slightly lower the daytime dose.       Plan:      Patient Instructions   Please reduce the Clonazepam to 1 tab in the morning and 1.5 in the evening. I am concerned that this medication may be contributing to your feeling of imbalance.   I recommend that you go to the ER for evaluation of your facial trauma due to concern for orbital fracture and injury to the eye. I suspect you will require MRI or CT imaging of your face and an ophthalmologic examination.  Follow up in 6 months    Health education/[place]ath literacy:   Patient education was provided. Reviewed the differential diagnosis, plan of care in details as well as other elements as details above. The benfits and risks of each procedure and medications were discussed with the patient. Alternative options were cardiovascular exercise, and group exercise classes, were considered and discussed with the patient. I spent another 5 minutes in non face to face patient care on the date of this visit.    A written summary was provided to the patient at the time of the visit, as well as instructions on how to access My Chart. My contact information was provided along with instructions on ow to reach the administrative office and the clinic.        Subjective:      HPI: Amber Duncan is a 71 y.o. Caucasian right handed female who presents for follow up to the [facility] Neurology Outpatient Department for follow up of myoclonus of chest and breathing muscles. Please review my initial clinic visit note dated on 06/17/16 for details regarding history of present illness.    Briefly, Her symptoms began in 2014 without clear inciting event. Initially symptoms were mild and intermittent, but she Duncan developed quite severe symptoms of breathlessness, and difficulty controlling her voice, as well as the feeling that she was being squeezed in her upper abdomen. She reports that an MRI of her brain 3 years ago at onset was read as normal. She had an EMG for concern for myasthenia gravis, which was negative. Clonazepam was tried at 0.5 mg qhs, she could not tolerate this and it did not seem to help her symptoms. She had been on Gabapentin for headaches, this did not help  headaches or breathing issues so was stopped. She was seen by Dr. Vergie Living in ENT, where examination did not reveal evidence of vocal cord dystonia. He referred her to movement disorders for evaluation. She denies history of exposure to anti-dopaminergic medications. Chest fluoroscopic evaluation 06/18/16 with sniff test revealed no abnormal or paradoxical movements of the chest wall at rest, with deep breathing, during speech, or while sniffing (I was present for this test). MRIc-spine demonstrated DJD, significant at C4-5, without impingement of cord or cord signal abnormality (images reviewed by me, also reviewed together with neuroradiology). Initially, Clonazepam improved her symptoms dramatically. However this stopped being effective, despite escalating dose. I switched her to Keppra late February 2018. This helped initially. She developed worsened symptoms along with generalized malaise, weight loss/nausea, bone pain of unclear etiology summer 2018, she had very extensive medical workup including infectious, endocrine, rheumatological, and neoplastic. I sent tic-borne illness panel which revealed Erlichiosis, we started treatment for this which produced partial improvement of her general symptoms. We weaned off Clonazepam and increased Keppra to 500 mg BID which helped initially, but symptoms returned so we restarted Clonazepam. I offered her methylphenidate 5mg  for her extreme fatigue, but this caused elevated BP so was stopped. I suggested she see her sleep doctor to investigate fatigue further. Had a RCA cardiac stent placed November 2020 and was diagnosed with breast cancer March 2021, underwent surgical lumpectomy 01/2020 and XRT starting late June 2021. She continues to have flares in her symptoms at times requiring adjustments to her medications.    Interim History:     Current medications for myoclonus:  Keppra 1000 mg BID  Clonazepam 0.5 mg tabs 1.5 tabs BID    SE: none at this time.    Past Medical Hx:    Past Medical History:   Diagnosis Date    Breast cancer (CMS-HCC)     Chronic UTI     COPD (chronic obstructive pulmonary disease) (CMS-HCC)     Coronary artery disease     Diabetes (CMS-HCC)     Disease of thyroid gland     Fatigue     Hepatitis     Hepatitis     Hyperlipidemia     Hypertension     Migraine     Sleep apnea        Past Surgical Hx:    Past Surgical History:   Procedure Laterality Date    BACK SURGERY      BREAST BIOPSY Left 11/2019    positive    BREAST LUMPECTOMY Left 01/2020    CATARACT EXTRACTION CHOLECYSTECTOMY      CORONARY STENT PLACEMENT      HYSTERECTOMY      KNEE SURGERY      PR COLONOSCOPY,ABLATE LESION N/A 01/21/2017    Procedure: COLONOSCOPY FLEX; W/ABLAT LES NOT AMENABLE-SNARE;  Surgeon: Luanne Bras, MD;  Location: GI PROCEDURES [facility];  Service: Gastroenterology    PR COLSC FLX W/RMVL OF TUMOR POLYP LESION SNARE TQ N/A 01/21/2017    Procedure: COLONOSCOPY FLEX; W/REMOV TUMOR/LES BY SNARE;  Surgeon: Luanne Bras, MD;  Location: [facility];  Service: Gastroenterology    PR MASTECTOMY, PARTIAL Left 01/19/2020    Procedure: MASTECTOMY, PARTIAL (EG, LUMPECTOMY, TYLECTOMY, QUADRANTECTOMY, SEGMENTECTOMY);  Surgeon: Talbert Cage, DO;  Location: MAIN OR [facility];  Service: Surgical Oncology Breast    PR UPPER GI ENDOSCOPY,BIOPSY N/A 04/29/2018    Procedure: UGI ENDOSCOPY; WITH BIOPSY, SINGLE OR MULTIPLE;  Surgeon: Rona Ravens, MD;  [facility];  Service: Gastroenterology    RADIATION Left     REDUCTION MAMMAPLASTY Bilateral     just a lift    REPLACEMENT TOTAL KNEE Right        Social Hx:    Social History     Socioeconomic History    Marital status: Divorced   Tobacco Use    Smoking status: Former     Packs/day: 2.00     Years: 48.00     Additional pack years: 0.00     Total pack years: 96.00     Types: Cigarettes     Quit date: 11/06/2011     Years since quitting: 10.6     Passive exposure: Past    Smokeless tobacco: Never   Vaping Use    Vaping Use: Never used   Substance and Sexual Activity    Alcohol use: No    Drug use: No    Sexual activity: Not Currently   Social History Narrative    She lives in [place]. 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 She denies alcohol and illicit drug use.       Social Determinants of Health     Financial Resource Strain: Medium Risk (11/19/2020)    Overall Financial Resource Strain (CARDIA)     Difficulty of Paying Living Expenses: Somewhat hard   Food Insecurity: Food Insecurity Present (11/19/2020)    Hunger Vital Sign     Worried About Running Out of Food in the Last Year: Sometimes true     Ran Out of Food in the Last Year: Never true   Transportation Needs: No Transportation Needs (06/17/2021)    PRAPARE - Therapist, art (Medical): No     Lack of Transportation (Non-Medical): No       Family Hx:    Family History   Problem Relation Age of Onset    Cancer Brother         liver    Cancer Maternal Aunt     Cancer Maternal Uncle     Diabetes Paternal Uncle     Other Mother         MVA    Other Father         MVA    No Known Problems Sister     No Known Problems Son     Cancer Brother         Lung    Other Sister         MVA    Other Sister         MVA drunk driver    No Known Problems Brother     No Known Problems Daughter     No Known Problems Maternal Grandmother     No Known Problems Maternal Grandfather     No Known Problems Paternal Grandmother     No Known Problems Paternal Grandfather     No Known Problems Other     Heart failure Neg Hx     BRCA 1/2 Neg Hx     Breast cancer Neg Hx     Colon cancer Neg Hx     Endometrial cancer Neg Hx     Ovarian cancer Neg Hx        ALLERGIES:  No Known Allergies    CURRENT MEDICATIONS:    Current Outpatient Medications   Medication Sig Dispense Refill  aspirin-calcium carbonate 81 mg-300 mg calcium(777 mg) Tab Take 81 mg by mouth.      cephalexin (KEFLEX) 250 MG capsule Take 1 capsule daily for UTI prevention 30 capsule 11    clonazePAM (KLONOPIN) 0.5 MG tablet TAKE UP TO 2 TABLETS TWICE DAILY AS DIRECTED 180 tablet 0    d-mannose 500 mg cap Take 1,500 mg by mouth daily. 90 capsule 11    docusate sodium (COLACE) 100 MG capsule Take 1 capsule (100 mg total) by mouth daily as needed.      empty container (SHARPS-A-GATOR DISPOSAL SYSTEM) Misc Use as directed for sharps disposal 1 each 2    evolocumab (REPATHA SURECLICK) 140 mg/mL PnIj Inject the contents of 1 pen (140 mg) under the skin every fourteen (14) days. 2 mL 11    ferrous gluconate 324 MG tablet Take 1 tablet (324 mg total) by mouth once.      FLUoxetine (PROZAC) 20 MG capsule Take 1 capsule (20 mg total) by mouth daily. 90 capsule 3    levETIRAcetam (KEPPRA) 1000 MG tablet TAKE 1 TABLET BY MOUTH 2 TIMES DAILY 180 tablet 3    lisinopriL (PRINIVIL,ZESTRIL) 20 MG tablet Take 1 tablet (20 mg total) by mouth daily. 90 tablet 3    metFORMIN (GLUCOPHAGE) 500 MG tablet Take 2 tablets (1,000 mg total) by mouth in the morning and 2 tablets (1,000 mg total) in the evening. Take with meals. 360 tablet 1    omeprazole 20 mg TbLD Take by mouth nightly as needed.      pravastatin (PRAVACHOL) 10 MG tablet TAKE 0.5 TABLET BY MOUTH DAILY. 45 tablet 2    SYNTHROID 100 mcg tablet Take 1 tablet (100 mcg total) by mouth daily. 90 tablet 3     No current facility-administered medications for this visit.       Review of Systems:  A 10-systems review was performed and, unless otherwise noted, declared negative by patient.        Objective:      Objective [] Expand by Default     Physical Exam:  Height 5'6'', weight 186 lbs.      Sitting BP 140/81 HR 85  Standing: BP 136/74 HR 66     Neurological Examination:     Motor: symmetric bulk throughout. No fasciculations.        MOVEMENT DISORDERS EXAMINATION:   Facial expression was normal. Speech is strained Duncan after initiating speech but remains legible and vocalization does not cease.  There was no evidence of tremor. She could get up from a low lying chair without help of both hands.  Gait was steady with good heel to toe stride, stride length was normal. Speed was normal. Arm swing was normal bilaterally.      There is vocalization but is strained Duncan after starting to speak, but does not lose the vocalization. Voice is at times quiet..    Gait: Normal gait as above      Diagnostic Studies and Review of Records:      Studies reviewed:  MRI C-spine: multilevel DJD, most prominent at C4-5, no cord signal abnormality to my eye, and reviewed with neuroradiologist    Fluoroscopic chest evaluation with sniff test: no abnormal movement of diaphragm or other chest wall structures with rest, normal breathing, deep breathing, speech, or sniffing    Ehrlichia antibodies: 1:256 04/2017 and SAME on repeat 05/2017    ACTH stimulation test 07/2017: normal    Cortisol: normal    TSH: 0.50 (slightly  low) Review of Records:      Studies reviewed:  MRI C-spine: multilevel DJD, most prominent at C4-5, no cord signal abnormality to my eye, and reviewed with neuroradiologist    Fluoroscopic chest evaluation with sniff test: no abnormal movement of diaphragm or other chest wall structures with rest, normal breathing, deep breathing, speech, or sniffing    Ehrlichia antibodies: 1:256 04/2017 and SAME on repeat 05/2017    ACTH stimulation test 07/2017: normal    Cortisol: normal    TSH: 0.50 (slightly low)

## 2022-06-23 ENCOUNTER — Emergency Department
Admit: 2022-06-23 | Discharge: 2022-06-23 | Disposition: A | Discharge status: Home / Self Care | Payer: PRIVATE HEALTH INSURANCE

## 2022-06-23 ENCOUNTER — Ambulatory Visit
Admit: 2022-06-23 | Discharge: 2022-06-23 | Disposition: A | Discharge status: Home / Self Care | Payer: PRIVATE HEALTH INSURANCE

## 2022-06-23 ENCOUNTER — Ambulatory Visit
Admit: 2022-06-23 | Discharge: 2022-06-24 | Disposition: A | Discharge status: Home / Self Care | Payer: PRIVATE HEALTH INSURANCE | Attending: Neurology | Primary: Neurology

## 2022-06-23 DIAGNOSIS — R066 Hiccough: Principal | ICD-10-CM

## 2022-06-23 DIAGNOSIS — S0285XA Fracture of orbit, unspecified, initial encounter for closed fracture: Principal | ICD-10-CM

## 2022-06-23 DIAGNOSIS — S0451XA Injury of facial nerve, right side, initial encounter: Principal | ICD-10-CM

## 2022-06-23 DIAGNOSIS — W19XXXA Unspecified fall, initial encounter: Principal | ICD-10-CM

## 2022-06-23 DIAGNOSIS — S0993XA Unspecified injury of face, initial encounter: Principal | ICD-10-CM

## 2022-06-23 DIAGNOSIS — G253 Myoclonus: Principal | ICD-10-CM

## 2022-06-23 MED ORDER — CEPHALEXIN 500 MG CAPSULE
ORAL_CAPSULE | Freq: Four times a day (QID) | ORAL | 0 refills | 10 days | Status: CP
Start: 2022-06-23 — End: 2022-07-03

## 2022-06-23 MED ADMIN — tetracaine HCl (PF) (ALTACAINE) 0.5 % 1 drop: 1 [drp] | OPHTHALMIC | @ 15:00:00 | Stop: 2022-06-23

## 2022-06-23 NOTE — Unmapped (Signed)
Bed: 52-C  Expected date:   Expected time:   Means of arrival:   Comments:

## 2022-06-23 NOTE — Unmapped (Addendum)
Pt here from neuro apt. Had fall last week and neurologist was concerned for orbital fracture. Pt having slight visual changes

## 2022-06-23 NOTE — Unmapped (Signed)
Patient fell from bed Wednesday night. Reports she does not know how she feel. Right eye bruising and right hip bruising. Ambulatory. + vision changes

## 2022-06-23 NOTE — Unmapped (Signed)
Please reduce the Clonazepam to 1 tab in the morning and 1.5 in the evening. I am concerned that this medication may be contributing to your feeling of imbalance.   I recommend that you go to the ER for evaluation of your facial trauma due to concern for orbital fracture and injury to the eye. I suspect you will require MRI or CT imaging of your face and an ophthalmologic examination.  Follow up in 6 months

## 2022-06-23 NOTE — Unmapped (Signed)
Bed: 44-C  Expected date:   Expected time:   Means of arrival:   Comments:

## 2022-06-23 NOTE — Unmapped (Signed)
Ophthalmology Consult Note    Requesting Attending Physician: Macie Burows  Service Requesting Consult: Emergency Medicine   Consult Attending Physician: Dr.  Aline Brochure    Assessment:  71 y.o. female with a h/o cataract surgery OU, T2DM, CAD (s/p stent x2), interstitial lung disease, hypothyroid (s/p RAI for Graves) who presents with facial trauma.    Ophthalmology was consulted for assistance with evaluation and management.    Impression:    #Blunt trauma to right eye:  - Negative for signs of globe rupture  - VA 20/50 OD, 20/30 OS  - IOP 13/15  - Exam: periorbital edema and ecchymosis  - Low concern for new retrobulbar hematoma    #Orbital fractures, OD:  - CT scan: attempted to review personally but scan would not load on multiple hospital computers (there was a technical error uploading scan to chart), radiologist read indicates fractures of orbital floor, lateral and medial orbital walls  - PERRL, no APD  - Voluntary extraocular movements intact and full  - Forced ductions unnesseccary  - Negative for obvious enophthalmos/hypoglobus  - Negative for diplopia in primary gaze  - Negative for orbital rim step offs    #Operculated Hole OD  - 1 o'clock in far periphery  - appears chronic with RPE changes underlying   - denies new floaters or flashes of light since fall    Recommendation:  - There is no indication for acute intervention based on my exam given lack of significant entrapment, oculocardiac reflex or symptomatic n/v.  - Fracture repair per Facial Trauma Team  - Recommend starting antibiotic coverage with Keflex 250-500mg  PO QID x7-10 days, may use clindamycin if penicillin allergy. Or antibiotics as per facial trauma team.   - Saline nasal sprays for 2 weeks; Flonase nasal spray once daily (over the counter)  - Artificial tears QID for 5 days  - Ice packs over affected eye/eyes for 20 min every 1-2 hours for the next 24-48 hours  - Refrain from Nose blowing 2-3 weeks. No bending, straining, heavy lifting, or using straws  - Will arrange for f/u with Oculoplastics evaluation in 1-2 weeks at our clinic [facility], [address] [place], [phone]).  - Will arrange retina follow for operculated hole     Andrez Grime, MD, MD  Ophthalmology Resident  __________________________________________________________________    Reason for Consult:  Facial trauma    History of Present Illness:  Amber Duncan is a 71 y.o. female whom we are asked to see in consultation for the above.    History:    Patient presents for right orbital trauma after falling out of bed. Lost consciences after falling. Had right orbital fractures on CT. Feels like her vision is blurry.      Past Ocular History:  - cataract surgery OU    Past Medical History:  Past Medical History:   Diagnosis Date    Breast cancer (CMS-HCC)     Chronic UTI     COPD (chronic obstructive pulmonary disease) (CMS-HCC)     Coronary artery disease     Diabetes (CMS-HCC)     Disease of thyroid gland     Fatigue     Hepatitis     Hepatitis     Hyperlipidemia     Hypertension     Migraine     Sleep apnea        Past Surgical History:  Past Surgical History:   Procedure Laterality Date  BACK SURGERY      BREAST BIOPSY Left 11/2019    positive    BREAST LUMPECTOMY Left 01/2020    CATARACT EXTRACTION      CHOLECYSTECTOMY      CORONARY STENT PLACEMENT      HYSTERECTOMY      KNEE SURGERY      PR COLONOSCOPY,ABLATE LESION N/A 01/21/2017    Procedure: COLONOSCOPY FLEX; W/ABLAT LES NOT AMENABLE-SNARE;  Surgeon: Luanne Bras, MD;  [facility]: [facility];  Service: Gastroenterology    PR COLSC FLX W/RMVL OF TUMOR POLYP LESION SNARE TQ N/A 01/21/2017    Procedure: COLONOSCOPY FLEX; W/REMOV TUMOR/LES BY SNARE;  Surgeon: Luanne Bras, MD;  [facility]: [facility];  Service: Gastroenterology    PR MASTECTOMY, PARTIAL Left 01/19/2020    Procedure: MASTECTOMY, PARTIAL (EG, LUMPECTOMY, TYLECTOMY, QUADRANTECTOMY, SEGMENTECTOMY);  Surgeon: Talbert Cage, DO;  Location: MAIN OR [facility];  Service: Surgical Oncology Breast    PR UPPER GI ENDOSCOPY,BIOPSY N/A 04/29/2018    Procedure: UGI ENDOSCOPY; WITH BIOPSY, SINGLE OR MULTIPLE;  Surgeon: Rona Ravens, MD;  [facility]: [facility];  Service: Gastroenterology    RADIATION Left     REDUCTION MAMMAPLASTY Bilateral     just a lift    REPLACEMENT TOTAL KNEE Right        Family History:  - negative family ocular history     Social History:  She  reports that she quit smoking about 10 years ago. Her smoking use included cigarettes. She has a 96.00 pack-year smoking history. She has been exposed to tobacco smoke. She has never used smokeless tobacco. She reports that she does not drink alcohol and does not use drugs.    Medications:  Scheduled Meds:   Continuous Infusions:   PRN Meds:     Allergies:  No Known Allergies    Review of Systems:  12 systems reviewed and negative unless otherwise stated in HPI or recent HPI    Physical Exam:  Vitals:    06/23/22 1006 06/23/22 1024   BP:  173/115   Pulse: 94 94   Resp:  20   Temp:  36.5 ??C (97.7 ??F)   TempSrc:  Oral   SpO2: 97% 95%   Weight:  86.2 kg (190 lb)       General:   No acute distress    Neuro/Psych:  Alert and oriented to person, place, and time    Ophthalmic Exam:  Base Eye Exam       Visual Acuity (Snellen - Linear)         Right Left    Near cc 20/50 20/30              Tonometry (Tonopen, 2:29 PM)         Right Left    Pressure 16 17              Pupils         Shape React APD    Right Round Brisk None    Left Round Brisk None              Visual Fields         Left Right     Full Full              Extraocular Movement         Right Left     Full Full  Dilation       Both eyes: 1% Tropicamide, 2.5% Phenylephrine @ 2:30 PM                  Slit Lamp and Fundus Exam       External Exam         Right Left    External Normal Normal              Slit Lamp Exam         Right Left    Lids/Lashes Normal Normal    Conjunctiva/Sclera Subconj heme, nonbullous, sparing temporal White and quiet    Cornea PEE Clear    Anterior Chamber Deep and quiet Deep and quiet    Iris Round and reactive Round and reactive    Lens PCIOL, mild PCO PCIOL    Anterior Vitreous PVD PVD              Fundus Exam         Right Left    Disc Normal Normal    C/D Ratio 0.1 0.1    Macula drusen Normal    Vessels Normal Normal    Periphery operculated hole with RPE changes under operculum, appears chronic Normal                    Diagnostic Testing:  All pertinent labs and imaging results reviewed.  _________________________________________________________________    Thank you for this consultation.    Please page on-call or consult resident with any questions.    [facility] may be reached at [phone]

## 2022-06-23 NOTE — Unmapped (Signed)
[facility]  Emergency Department Provider Note       ED Course, Assessment and Plan      This is a 71 year old female who presents for evaluation of a right eye injury.  Patient fell 1 week ago out of her bed and struck her face on the nightstand.  She has right periorbital ecchymosis with conjunctival injection.  Right 20/50.  Left 20/30.  PERRLA.  EOMI.  CT head without intracranial hemorrhage.  CT face with a blowout fracture of the right orbital wall.  Patient was evaluated by facial surgery and ophthalmology.  Will discharge with Keflex and sinus precautions.  Patient will have follow-up with oculoplastics.       Discussion of Management with other Physicians, QHP or Appropriate Source: Discussed case with OMFS and ophthalmology  Independent Interpretation of Studies: Reviewed patient CT which showed a right orbital wall fracture  External Records Reviewed:  Escalation of Care, Consideration of Admission/Observation/Transfer:  Social determinants that significantly affected care:   Prescription drug(s) considered but not prescribed:  Diagnostic tests considered but not performed:      HPI     Amber Duncan is a 71 y.o. female with past medical history of COPD, CAD, HTN, and T2DM presenting for evaluation of a right eye injury. The patient reports falling 1 week ago hitting her face against the nightstand. There was a lot of blood on the carpet. She is not sure how she fell nor how long she was on the ground for. Currently, she endorses right eye blurriness and right sided facial numbness. Furthermore, she reports a large bruise on her right hip that she also sustained from her fall. She is not on a formal blood thinner but takes Aspirin. Denies difficulty ambulating.     Per chart review, patient was seen today by Neurology for abnormal chest wall movement. However, during that visit, patient was noted to have significant trauma to her right eye which Neurology thought was concerning for an orbital fracture, facial nerve injury, and eye injury. They recommended patient come to the ED for further evaluation.      Physical Exam     Vitals:    06/23/22 1006 06/23/22 1024   BP:  173/115   Pulse: 94 94   Resp:  20   Temp:  36.5 ??C (97.7 ??F)   TempSrc:  Oral   SpO2: 97% 95%   Weight:  86.2 kg (190 lb)        Constitutional: In no distress.  Eyes: She has right periorbital ecchymosis with conjunctival injection.  Right 20/50.  Left 20/30.  PERRLA.  EOMI  ENT       Head: Normocephalic and atraumatic.       Nose: No congestion.       Mouth/Throat: Mucous membranes are moist.       Neck: No stridor.  Hematological/Lymphatic/Immunilogical: No cervical lymphadenopathy.  Cardiovascular: See heart rate listed above. Normal skin perfusion. Normal and symmetric distal pulses are present in all extremities.  Respiratory: See respiratory rate listed above. Speaking easily in full sentences  Gastrointestinal: Soft, non-distended, non-tender   Musculoskeletal: Nontender with normal range of motion in all extremities.  Bruise over the right hip  Neurologic: Pupils are equal round reactive to light bilaterally.  Extraocular muscles are intact. No nystagmus.  Face symmetric. Normal facial sensation. Normal shoulder shrug.  Symmetric palate elevation.  No pronator drift.  Normal finger to nose testing. Normal grip strength.  Normal flexion and extension of  the upper extremities.  Normal flexion and extension of the lower extremities.  Normal gait  Skin: Skin is warm, dry and intact.  Psychiatric: Mood and affect are normal.     Past History     PAST MEDICAL HISTORY/PAST SURGICAL HISTORY:   Past Medical History:   Diagnosis Date    Breast cancer (CMS-HCC)     Chronic UTI     COPD (chronic obstructive pulmonary disease) (CMS-HCC)     Coronary artery disease     Diabetes (CMS-HCC)     Disease of thyroid gland     Fatigue     Hepatitis     Hepatitis     Hyperlipidemia     Hypertension     Migraine     Sleep apnea        Past Surgical History:   Procedure Laterality Date    BACK SURGERY      BREAST BIOPSY Left 11/2019    positive    BREAST LUMPECTOMY Left 01/2020    CATARACT EXTRACTION      CHOLECYSTECTOMY      CORONARY STENT PLACEMENT      HYSTERECTOMY      KNEE SURGERY      PR COLONOSCOPY,ABLATE LESION N/A 01/21/2017    Procedure: COLONOSCOPY FLEX; W/ABLAT LES NOT AMENABLE-SNARE;  Surgeon: Luanne Bras, MD;  [facility]: [facility];  Service: Gastroenterology    PR COLSC FLX W/RMVL OF TUMOR POLYP LESION SNARE TQ N/A 01/21/2017    Procedure: COLONOSCOPY FLEX; W/REMOV TUMOR/LES BY SNARE;  Surgeon: Luanne Bras, MD;  [facility]: [facility];  Service: Gastroenterology    PR MASTECTOMY, PARTIAL Left 01/19/2020    Procedure: MASTECTOMY, PARTIAL (EG, LUMPECTOMY, TYLECTOMY, QUADRANTECTOMY, SEGMENTECTOMY);  Surgeon: Talbert Cage, DO;  Location: MAIN OR [facility];  Service: Surgical Oncology Breast    PR UPPER GI ENDOSCOPY,BIOPSY N/A 04/29/2018    Procedure: UGI ENDOSCOPY; WITH BIOPSY, SINGLE OR MULTIPLE;  Surgeon: Rona Ravens, MD;  Location: [facility];  Service: Gastroenterology    RADIATION Left     REDUCTION MAMMAPLASTY Bilateral     just a lift    REPLACEMENT TOTAL KNEE Right        MEDICATIONS:   No current facility-administered medications for this encounter.    Current Outpatient Medications:     aspirin-calcium carbonate 81 mg-300 mg calcium(777 mg) Tab, Take 81 mg by mouth., Disp: , Rfl:     cephalexin (KEFLEX) 250 MG capsule, Take 1 capsule daily for UTI prevention, Disp: 30 capsule, Rfl: 11    clonazePAM (KLONOPIN) 0.5 MG tablet, TAKE UP TO 2 TABLETS TWICE DAILY AS DIRECTED, Disp: 180 tablet, Rfl: 0    docusate sodium (COLACE) 100 MG capsule, Take 1 capsule (100 mg total) by mouth daily as needed., Disp: , Rfl:     empty container (SHARPS-A-GATOR DISPOSAL SYSTEM) Misc, Use as directed for sharps disposal, Disp: 1 each, Rfl: 2    evolocumab (REPATHA SURECLICK) 140 mg/mL PnIj, Inject the contents of 1 pen (140 mg) under the skin every fourteen (14) days., Disp: 2 mL, Rfl: 11    ferrous gluconate 324 MG tablet, Take 1 tablet (324 mg total) by mouth once., Disp: , Rfl:     FLUoxetine (PROZAC) 20 MG capsule, Take 1 capsule (20 mg total) by mouth daily., Disp: 90 capsule, Rfl: 3    levETIRAcetam (KEPPRA) 1000 MG tablet, TAKE 1 TABLET BY MOUTH 2 TIMES DAILY, Disp: 180 tablet, Rfl: 3  lisinopriL (PRINIVIL,ZESTRIL) 20 MG tablet, Take 1 tablet (20 mg total) by mouth daily., Disp: 90 tablet, Rfl: 3    metFORMIN (GLUCOPHAGE) 500 MG tablet, Take 2 tablets (1,000 mg total) by mouth in the morning and 2 tablets (1,000 mg total) in the evening. Take with meals., Disp: 360 tablet, Rfl: 1    omeprazole 20 mg TbLD, Take by mouth nightly as needed., Disp: , Rfl:     pravastatin (PRAVACHOL) 10 MG tablet, TAKE 0.5 TABLET BY MOUTH DAILY., Disp: 45 tablet, Rfl: 2    SYNTHROID 100 mcg tablet, Take 1 tablet (100 mcg total) by mouth daily., Disp: 90 tablet, Rfl: 3    ALLERGIES:   Patient has no known allergies.    SOCIAL HISTORY:   Social History     Tobacco Use    Smoking status: Former     Packs/day: 2.00     Years: 48.00     Additional pack years: 0.00     Total pack years: 96.00     Types: Cigarettes     Quit date: 11/06/2011     Years since quitting: 10.6     Passive exposure: Past    Smokeless tobacco: Never   Substance Use Topics    Alcohol use: No       FAMILY HISTORY:  Family History   Problem Relation Age of Onset    Cancer Brother         liver    Cancer Maternal Aunt     Cancer Maternal Uncle     Diabetes Paternal Uncle     Other Mother         MVA    Other Father         MVA    No Known Problems Sister     No Known Problems Son     Cancer Brother         Lung    Other Sister         MVA    Other Sister         MVA drunk driver    No Known Problems Brother     No Known Problems Daughter     No Known Problems Maternal Grandmother     No Known Problems Maternal Grandfather     No Known Problems Paternal Grandmother     No Known Problems Paternal Grandfather     No Known Problems Other     Heart failure Neg Hx     BRCA 1/2 Neg Hx     Breast cancer Neg Hx     Colon cancer Neg Hx     Endometrial cancer Neg Hx     Ovarian cancer Neg Hx          Radiology     No orders to display        Laboratory Data     Lab Results   Component Value Date    WBC 7.1 04/28/2022    HGB 12.7 04/28/2022    HCT 37.3 04/28/2022    PLT 271 04/28/2022       Lab Results   Component Value Date    NA 137 10/13/2021    K 4.5 10/13/2021    CL 101 10/13/2021    CO2 26.2 10/13/2021    BUN 16 10/13/2021    CREATININE 1.05 (H) 10/13/2021    GLU 141 10/13/2021    CALCIUM 10.0 10/13/2021       Lab Results   Component  Value Date    BILITOT 0.4 10/13/2021    BILIDIR 0.30 01/20/2017    PROT 8.0 10/13/2021    ALBUMIN 4.2 10/13/2021    ALT 7 (L) 10/13/2021    AST 11 10/13/2021    ALKPHOS 86 10/13/2021       Lab Results   Component Value Date    INR 1.03 05/26/2018    APTT 33.7 05/26/2018       Portions of this record have been created using [employer]. Dictation errors have been sought, but may not have been identified and corrected.    Documentation assistance was provided by Michaelene Song, on June 23, 2022 at 11:05 for Carmelia Bake., MD.     Documentation assistance was provided by the scribe in my presence.  The documentation recorded by the scribe has been reviewed by me and accurately reflects the services I personally performed.         Carmelia Bake., MD  Resident  06/23/22 [phone]

## 2022-06-24 NOTE — Unmapped (Cosign Needed)
Oral and Maxillofacial Surgery Inpatient/ED Consultation    Service Date: 06/23/2022  Admit Date: 06/23/2022  Consulting Service: Oral and Maxillofacial Surgery [facility])  Requesting Service: Emergency Medicine  Patient Location: Emergency Department  Requesting Attending Physician: No att. providers found  Consulting Attending Physician: Vertell Limber DDS    Assessment     Amber Duncan is a 71 y.o. female with h/o HTN, Hepetitis, OSA. HLD, bilateral cataract surgery, T2DM, CAD (s/p stent x2), COPD, RAI for graves disease now hypothyroid on levothyroxine and follows with neurology for spinal myclonus who presents 1 week s/p Right inferior orbital wall fracture and periorbital edema/ecchymosis along with subconjunctival hemorrhage after fall and hitting her night stand. The patient's fractures appear to be minimally displaced  with no signs of entrapment, diplopia or enophthalmus, however may require surgical intervention given further assessment in the outpatient setting.      Recommendations     Pain management per ED  Ophthalmology consultation, appre recs  Has 1-2 wk fu with oculoplastics   No acute need for intervention at this time  Continue with Ophthalmology outpatient recs  Sinus precautions:   No nose blowing  No sucking through a straw  Cough/Sneeze with mouth open  Antibiotics per Ophthalmology, no indications per OMFS  Follow up in OMFS clinic in 2 weeks     Thank you for this consultation.  Please call OMS consult pager at [phone] with any questions or concerns.    Subjective     Reason for Consult:  Right orbital floor fracture    History of Present Illness:  Amber Duncan is a 71 y.o. female with aforementioned  history who presents to the [facility] ED after visiting with her neurologist today regarding a fall at home 7 days prior.  Patient states that she does not recall what she was doing in the middle of the night but she fell and hit the side of her face on her nightstand.  She recalls that there was a lot of blood at the time, but eventually was hemostatic.  She did not seek medical attention at that time.  She does not recall if there was any loss of consciousness.  Patient states that she lives alone.  Over the last week she has noted increased blurry vision and bruising to the right side of her face.  She does have mobility concerns related to her neurological issues.  She went to see her neurologist today and given her symptoms recommended to come to the ED for imaging and assessment.  CT scan revealed comminuted right orbital blowout fracture with no involvement of the inferior rectus muscle.  There is no sign of entrapment.  Patient denies nausea, vomiting  or or excessive bleeding.  She reports that she is only taking aspirin 81 mg for her CAD.  The OMFS team was consulted regarding assessment of facial fractures.  On arrival ocular pressures were between 12 and 14 mmHg respectively.  Patient does not appear to require any acute surgical interventions, and will continue to follow-up in outpatient setting.     Personal Medical History:   Past Medical History:   Diagnosis Date    Breast cancer (CMS-HCC)     Chronic UTI     COPD (chronic obstructive pulmonary disease) (CMS-HCC)     Coronary artery disease     Diabetes (CMS-HCC)     Disease of thyroid gland     Fatigue     Hepatitis     Hepatitis  Hyperlipidemia     Hypertension     Migraine     Sleep apnea        Personal Surgical History:   Past Surgical History:   Procedure Laterality Date    BACK SURGERY      BREAST BIOPSY Left 11/2019    positive    BREAST LUMPECTOMY Left 01/2020    CATARACT EXTRACTION      CHOLECYSTECTOMY      CORONARY STENT PLACEMENT      HYSTERECTOMY      KNEE SURGERY      PR COLONOSCOPY,ABLATE LESION N/A 01/21/2017    Procedure: COLONOSCOPY FLEX; W/ABLAT LES NOT AMENABLE-SNARE;  Surgeon: Luanne Bras, MD;  Location: [facility] PROCEDURES [facility];  Service: Gastroenterology    PR COLSC FLX W/RMVL OF TUMOR POLYP LESION SNARE TQ N/A 01/21/2017    Procedure: COLONOSCOPY FLEX; W/REMOV TUMOR/LES BY SNARE;  Surgeon: Luanne Bras, MD;  Location: [facility];  Service: Gastroenterology    PR MASTECTOMY, PARTIAL Left 01/19/2020    Procedure: MASTECTOMY, PARTIAL (EG, LUMPECTOMY, TYLECTOMY, QUADRANTECTOMY, SEGMENTECTOMY);  Surgeon: Talbert Cage, DO;  Location: MAIN OR [facility];  Service: Surgical Oncology Breast    PR UPPER GI ENDOSCOPY,BIOPSY N/A 04/29/2018    Procedure: UGI ENDOSCOPY; WITH BIOPSY, SINGLE OR MULTIPLE;  Surgeon: Rona Ravens, MD;  [facility];  Service: Gastroenterology    RADIATION Left     REDUCTION MAMMAPLASTY Bilateral     just a lift    REPLACEMENT TOTAL KNEE Right        Social History:   Tobacco use: Currently denies  Alcohol use: denies  Drug use: denies    Family History:   Patient denies any family history of coagulopathy or difficulty under anaesthesia.    Allergies:   Patient has no known allergies.    Medications:  No current facility-administered medications for this encounter.     Current Outpatient Medications   Medication Sig Dispense Refill    aspirin-calcium carbonate 81 mg-300 mg calcium(777 mg) Tab Take 81 mg by mouth.      cephalexin (KEFLEX) 250 MG capsule Take 1 capsule daily for UTI prevention 30 capsule 11    cephalexin (KEFLEX) 500 MG capsule Take 1 capsule (500 mg total) by mouth four (4) times a day for 10 days. 40 capsule 0    clonazePAM (KLONOPIN) 0.5 MG tablet TAKE UP TO 2 TABLETS TWICE DAILY AS DIRECTED 180 tablet 0    docusate sodium (COLACE) 100 MG capsule Take 1 capsule (100 mg total) by mouth daily as needed.      empty container (SHARPS-A-GATOR DISPOSAL SYSTEM) Misc Use as directed for sharps disposal 1 each 2    evolocumab (REPATHA SURECLICK) 140 mg/mL PnIj Inject the contents of 1 pen (140 mg) under the skin every fourteen (14) days. 2 mL 11    ferrous gluconate 324 MG tablet Take 1 tablet (324 mg total) by mouth once. FLUoxetine (PROZAC) 20 MG capsule Take 1 capsule (20 mg total) by mouth daily. 90 capsule 3    levETIRAcetam (KEPPRA) 1000 MG tablet TAKE 1 TABLET BY MOUTH 2 TIMES DAILY 180 tablet 3    lisinopriL (PRINIVIL,ZESTRIL) 20 MG tablet Take 1 tablet (20 mg total) by mouth daily. 90 tablet 3    metFORMIN (GLUCOPHAGE) 500 MG tablet Take 2 tablets (1,000 mg total) by mouth in the morning and 2 tablets (1,000 mg total) in the evening. Take with meals. 360 tablet 1  omeprazole 20 mg TbLD Take by mouth nightly as needed.      pravastatin (PRAVACHOL) 10 MG tablet TAKE 0.5 TABLET BY MOUTH DAILY. 45 tablet 2    SYNTHROID 100 mcg tablet Take 1 tablet (100 mcg total) by mouth daily. 90 tablet 3       Review of Systems:  Negative except otherwise stated in HPI    Objective     Vitals:  Patient Vitals for the past 8 hrs:   BP Temp Temp src Pulse Resp SpO2   06/23/22 1513 160/87 37.3 ??C (99.1 ??F) Oral 100 16 97 %     No intake/output data recorded.    Physical Examination:    Neuro: Awake, alert, oriented x3    HEENT:  Head: Normocephalic. No supra/infraorbital step-offs. No retro-auricular ecchymosis.  Right-sided periorbital ecchymosis and edema.  Malar prominences symmetric and without deformity. Midface stable. No step-offs along inferior mandibular border. Face asymmetric with no lacerations. Small well healed puncture wound at left malar region. However slightly more swollen in the right malar region.  Eyes: Gross visual acuity, EOMI intact. Pupils equal, round, reactive to light.  Subconjunctival hemorrhage.  Ears: External auditory canals clear without erythema.   Nose: External nose is midline with no step off or deformity. Septum midline. No septal hematoma. No epistaxis.   Oral: MIO 40 mm. Edentulous with dentures in place. Right maxillary vestibular ecchymosis.  No lacerations. Floor of mouth soft, non raised. Uvula midline.     CV: Regular rate     Pulm: Normal work of breathing.     Abdomen: Non-tender, non-distended. Extremities: Warm, well perfused    Pertinent Diagnostic Tests:  CT Maxillofacial:    Impression   -- Comminuted right orbital blowout fracture with involvement of the infraorbital foramen, but no inferior rectus muscle entrapment.. Additional nondisplaced fractures of the right lateral orbital wall, lamina papyracea, and anterior/posterior maxillary sinus walls, with blood within the maxillary sinus.      --There is likely also underlying chronic right maxillary sinus disease due to the inspissated appearance of soft tissue/fluid within the sinus. Noninvasive fungal sinusitis is also within the differential.

## 2022-07-02 NOTE — Unmapped (Signed)
[facility] Refill Coordination Note    Specialty Medication(s) to be Shipped:   General Specialty: Repatha    Other medication(s) to be shipped: No additional medications requested for fill at this time     Amber Duncan, DOB: 1951-06-02  Phone: [phone] (home)       All above HIPAA information was verified with patient.     Was a Nurse, learning disability used for this call? No    Completed refill call assessment today to schedule patient's medication shipment from the [facility] [phone]).  All relevant notes have been reviewed.     Specialty medication(s) and dose(s) confirmed: Regimen is correct and unchanged.   Changes to medications: Melenda reports no changes at this time.  Changes to insurance: No  New side effects reported not previously addressed with a pharmacist or physician: None reported  Questions for the pharmacist: No    Confirmed patient received a Conservation officer, historic buildings and a Surveyor, mining with first shipment. The patient will receive a drug information handout for each medication shipped and additional FDA Medication Guides as required.       DISEASE/MEDICATION-SPECIFIC INFORMATION        For patients on injectable medications: Patient currently has 0 doses left.  Next injection is scheduled for 07/09/22.    SPECIALTY MEDICATION ADHERENCE     Medication Adherence    Patient reported X missed doses in the last month: 0  Specialty Medication: Repatha 140mg /ml  Patient is on additional specialty medications: No  Patient is on more than two specialty medications: No  Any gaps in refill history greater than 2 weeks in the last 3 months: no  Demonstrates understanding of importance of adherence: yes  Informant: patient  Reliability of informant: reliable  Provider-estimated medication adherence level: good  Patient is at risk for Non-Adherence: No  Reasons for non-adherence: no problems identified                                Were doses missed due to medication being on hold? No    REPATHA SURECLICK 140 mg/mL Pnij (evolocumab)  : 0 days of medicine on hand        REFERRAL TO PHARMACIST     Referral to the pharmacist: Not needed      [facility]     Shipping address confirmed in Epic.     Delivery Scheduled: Yes, Expected medication delivery date: 07/06/22.     Medication will be delivered via Same Day Courier to the prescription address in Epic WAM.    Zaniah [facility] Specialty Technician

## 2022-07-03 ENCOUNTER — Ambulatory Visit
Admit: 2022-07-03 | Discharge: 2022-07-04 | Payer: PRIVATE HEALTH INSURANCE | Attending: Student in an Organized Health Care Education/Training Program | Primary: Student in an Organized Health Care Education/Training Program

## 2022-07-03 DIAGNOSIS — H33321 Round hole, right eye: Principal | ICD-10-CM

## 2022-07-03 NOTE — Unmapped (Signed)
71 y.o. female with a h/o cataract surgery OU, T2DM, CAD (s/p stent x2), interstitial lung disease, hypothyroid (s/p RAI for Graves) who is here for follow up orbital fractures.    Ophthalmology was consulted for assistance with evaluation and management.    Impression:    #Orbital fractures, OD:  - CT scan: Comminuted right orbital blowout fracture with involvement of the infraorbital foramen, but no inferior rectus muscle entrapment.. Additional nondisplaced fractures of the right lateral orbital wall, lamina papyracea, and anterior/posterior maxillary sinus walls, with blood within the maxillary sinus.   - PERRL, no APD  - Voluntary extraocular movements intact and full  - Forced ductions unnesseccary  - Negative for obvious enophthalmos/hypoglobus  - Negative for diplopia in primary gaze  - Negative for orbital rim step offs  - Hertel measures 19, 18 (no enophthalmos)   - decreased sensation to right of nose and right lip likely due to traumatic damage to infraorbital nerve, which we expect to improve over time     #Operculated Hole OD  - 1 o'clock in far periphery  - appears chronic with RPE changes underlying   - denies new floaters or flashes of light since fall              Recommendation:  - no recommendation of OMFS to do surgery at this time  - Saline nasal sprays for 2 weeks; Flonase nasal spray once daily (over the counter)  - Artificial tears QID prn  - Refrain from Nose blowing 2-3 weeks. No bending, straining, heavy lifting, or using straws  - Will arrange retina follow for operculated hole     Idell Pickles, MD, MD  Ophthalmology Resident

## 2022-07-06 DIAGNOSIS — I2510 Atherosclerotic heart disease of native coronary artery without angina pectoris: Principal | ICD-10-CM

## 2022-07-06 NOTE — Unmapped (Unsigned)
Ophthalmology Retina Clinic    Timeline Summary      Right    Left        Date [facility] Findings Tx [facility]  IOP Findings Tx  Sys/Misc    May 22, 1951         DOB                                                                                                      Initial History of present illness:           has a past medical history of Breast cancer (CMS-HCC), Chronic UTI, COPD (chronic obstructive pulmonary disease) (CMS-HCC), Coronary artery disease, Diabetes (CMS-HCC), Disease of thyroid gland, Eye trauma (06/17/2022), Fatigue, Hepatitis, Hepatitis, Hyperlipidemia, Hypertension, Migraine, and Sleep apnea.       Assessment and plan:       07/05/2022 First visit Amber Duncan    First visit today Amber Duncan.     follow up     71 y.o. female with a h/o cataract surgery OU, T2DM, CAD (s/p stent x2), interstitial lung disease, hypothyroid (s/p RAI for Graves) who is here for follow up orbital fractures.    Ophthalmology was consulted for assistance with evaluation and management.    Impression:    #Orbital fractures, OD:  - CT scan: Comminuted right orbital blowout fracture with involvement of the infraorbital foramen, but no inferior rectus muscle entrapment.. Additional nondisplaced fractures of the right lateral orbital wall, lamina papyracea, and anterior/posterior maxillary sinus walls, with blood within the maxillary sinus.   - PERRL, no APD  - Voluntary extraocular movements intact and full  - Forced ductions unnesseccary  - Negative for obvious enophthalmos/hypoglobus  - Negative for diplopia in primary gaze  - Negative for orbital rim step offs  - Hertel measures 19, 18 (no enophthalmos)   - decreased sensation to right of nose and right lip likely due to traumatic damage to infraorbital nerve, which we expect to improve over time     #Operculated Hole OD  - 1 o'clock in far periphery  - appears chronic with RPE changes underlying   - denies new floaters or flashes of light since fall      Recommendation:  - no recommendation of OMFS to do surgery at this time  - Saline nasal sprays for 2 weeks; Flonase nasal spray once daily (over the counter)  - Artificial tears QID prn  - Refrain from Nose blowing 2-3 weeks. No bending, straining, heavy lifting, or using straws  - Will arrange retina follow for operculated hole     Idell Pickles, MD, MD  Ophthalmology Resident    Copy to :   Referring provider   Primary Care Provider [name], MD    INTERPRETATION EXTENDED OPHTHALMOSCOPY MACULA ([address])  Macula - right: vitreous syneresis,  drusen  Macula - left:  vitreous syneresis, Normal         07/05/2022   I saw and evaluated the patient, participating in the key portions of the service.  I reviewed the resident???s note.  I agree with  the resident???s findings and plan including interpretation of images.

## 2022-07-06 NOTE — Unmapped (Signed)
I was immediately available via phone/pager or present on site.  I reviewed and discussed the case with the resident, but did not see the patient.  I agree with the assessment and plan as documented in the resident's note. Markham Jordan, MD

## 2022-07-07 MED FILL — REPATHA SURECLICK 140 MG/ML SUBCUTANEOUS PEN INJECTOR: SUBCUTANEOUS | 28 days supply | Qty: 2 | Fill #11

## 2022-07-10 ENCOUNTER — Ambulatory Visit: Admit: 2022-07-10 | Discharge: 2022-07-11 | Payer: PRIVATE HEALTH INSURANCE

## 2022-07-10 DIAGNOSIS — Z961 Presence of intraocular lens: Principal | ICD-10-CM

## 2022-07-10 DIAGNOSIS — H35373 Puckering of macula, bilateral: Principal | ICD-10-CM

## 2022-07-10 DIAGNOSIS — H33321 Round hole, right eye: Principal | ICD-10-CM

## 2022-07-10 NOTE — Unmapped (Signed)
Ophthalmology Retina Clinic    Initial History of present illness:     71 yo female here today consultation requested by Dr. Nani Skillern, MD for a retinal evaluation with a history of retinal hole right eye.   Patient report she fell 06/18/2022 and hit her face on the corner of the night stand.  Patient report her vision is still blurry right eye. No double vision at this time.  Retina Evaluation   In right eye.  Characterized as blurry vision.  Severity is mild.       has a past medical history of Breast cancer (CMS-HCC), Chronic UTI, COPD (chronic obstructive pulmonary disease) (CMS-HCC), Coronary artery disease, Diabetes (CMS-HCC), Disease of thyroid gland, Eye trauma (06/17/2022), Fatigue, Hepatitis, Hepatitis, Hyperlipidemia, Hypertension, Migraine, and Sleep apnea.       Assessment and plan:       #Operculated Hole OD  - 1 o'clock in far periphery  - appears chronic with RPE changes underlying   - denies new floaters or flashes of light since fall  07/05/2022 First visit KLW    First visit today KLW. exam is reassuring. precautions discussed.     follow up 6 months, optos color and faf, oct      #Orbital fractures, OD:  sx stable,   continue fu with oculoplastics / facial trauma team    # Pseudophakia both eyes   stable  monitor    # epiretinal membrane both eyes  mild   observe      # diabetes type 2  Hbg A1c: 5.4 (01/12/2022), 5.3 (05/15/2021)  The patient was advised to maintain good  blood pressure control, favorable levels of cholesterol and tight glucose control with goal Hbg A1c <7%.         Copy to :   Referring provider   Primary Care Provider [name], MD    INTERPRETATION EXTENDED OPHTHALMOSCOPY MACULA (90 [address])  Macula - right: vitreous syneresis,  drusen, Epiretinal membrane  Macula - left:  vitreous syneresis, Epiretinal membrane         07/10/2022   I saw and evaluated the patient, participating in the key portions of the service.  I reviewed the resident???s note.  I agree with the resident???s findings and plan including interpretation of images.

## 2022-07-14 ENCOUNTER — Ambulatory Visit
Admit: 2022-07-14 | Discharge: 2022-07-15 | Payer: PRIVATE HEALTH INSURANCE | Attending: Nurse Practitioner | Primary: Nurse Practitioner

## 2022-07-14 DIAGNOSIS — D500 Iron deficiency anemia secondary to blood loss (chronic): Principal | ICD-10-CM

## 2022-07-14 LAB — CBC W/ AUTO DIFF
BASOPHILS ABSOLUTE COUNT: 0.1 10*9/L (ref 0.0–0.1)
BASOPHILS RELATIVE PERCENT: 2.4 %
EOSINOPHILS ABSOLUTE COUNT: 0.5 10*9/L (ref 0.0–0.5)
EOSINOPHILS RELATIVE PERCENT: 9.8 %
HEMATOCRIT: 35.8 % (ref 34.0–44.0)
HEMOGLOBIN: 12.2 g/dL (ref 11.3–14.9)
LYMPHOCYTES ABSOLUTE COUNT: 1.7 10*9/L (ref 1.1–3.6)
LYMPHOCYTES RELATIVE PERCENT: 33.6 %
MEAN CORPUSCULAR HEMOGLOBIN CONC: 34.1 g/dL (ref 32.0–36.0)
MEAN CORPUSCULAR HEMOGLOBIN: 34.4 pg — ABNORMAL HIGH (ref 25.9–32.4)
MEAN CORPUSCULAR VOLUME: 100.8 fL — ABNORMAL HIGH (ref 77.6–95.7)
MEAN PLATELET VOLUME: 7.7 fL (ref 6.8–10.7)
MONOCYTES ABSOLUTE COUNT: 0.5 10*9/L (ref 0.3–0.8)
MONOCYTES RELATIVE PERCENT: 9.5 %
NEUTROPHILS ABSOLUTE COUNT: 2.3 10*9/L (ref 1.8–7.8)
NEUTROPHILS RELATIVE PERCENT: 44.7 %
PLATELET COUNT: 238 10*9/L (ref 150–450)
RED BLOOD CELL COUNT: 3.55 10*12/L — ABNORMAL LOW (ref 3.95–5.13)
RED CELL DISTRIBUTION WIDTH: 14.2 % (ref 12.2–15.2)
WBC ADJUSTED: 5.1 10*9/L (ref 3.6–11.2)

## 2022-07-14 LAB — IRON PANEL
IRON SATURATION: 26 % (ref 20–55)
IRON: 73 ug/dL
TOTAL IRON BINDING CAPACITY: 286 ug/dL (ref 250–425)

## 2022-07-14 LAB — SEDIMENTATION RATE: ERYTHROCYTE SEDIMENTATION RATE: 23 mm/h (ref 0–30)

## 2022-07-14 LAB — C-REACTIVE PROTEIN: C-REACTIVE PROTEIN: <4 mg/L (ref <=10.0)

## 2022-07-14 LAB — RETICULOCYTES
RETICULOCYTE ABSOLUTE COUNT: 82.8 10*9/L (ref 23.0–100.0)
RETICULOCYTE COUNT PCT: 2.33 % — ABNORMAL HIGH (ref 0.50–2.17)

## 2022-07-14 LAB — FERRITIN: FERRITIN: 83.3 ng/mL

## 2022-07-14 NOTE — Unmapped (Unsigned)
Hematology Consult Note    Primary Care Physician:   [name], MD - [facility] [place]    Reason for Visit: Anemia    Assessment/Recommendations:  Amber Duncan is a 71 y.o. Caucasian female with a history of iron deficiency anemia, dysphonia spastica, DMT2 c/b gastroparesis, GERD, COPD, fatigue, hypothyroidism, HTN, CAD, chronic UTI, hyperlipidemia, and L breast cancer who we are seeing in follow up    1. Iron deficiency without anemia:  First noted to be anemic in 2015; she has received IV iron at [facility] since 2017. Most likely cause of iron deficiency is occult blood loss from AVMs and possibly GERD. She receives IV Ferrlecit, PRN, most recently received 2 infusions of Ferrlecit (500 mg total dose), last in 02/2022, all tolerated well. She is established with [facility] and while they recognize she likely has occult GIB, currently the risks of endoscopy and tx of recurrent AVMs outweigh the risks, therefore we will continue with PRN iron infusions, with a ferritin goal <100***. Additionally, she takes PO iron daily. It sounds like she is having episodes of abd cramping, fecal urgency/constipation, that could be r/t PO iron, therefore recommend reducing dose to 1 tab PO every other day.     Symptomatically, she remains fatigued, still having black stools, abd pain, and hasn't felt well in nearly a year. She has been diagnosed with ILD since her last visit, and has close follow up with [facility]. Labs from August 2023 showed good response to IV iron. Repeat labs today show Hgb ***, MCV ***, normal sed rate, CRP, and retics, iron sat ***%, ferritin ***. She should continue taking PO iron as tolerated. ***Will order Ferrlecit 500 mg IV to be given over 2 infusions. RTC in 6 months for follow up labs. All questions were answered and the patient was in agreement with this plan.    2. Vitamin B-12 deficiency: On 10/13/21 Vitamin B-12 274. PCP recommended she start PO B-12 supplements daily; I agree with this plan. MCV 100.9 today***. If macrocytosis remains at next visit, consider MDS panel.     Plan:  - Labs collected today: CBC, Retic, Iron Studies: Iron, Iron Sat, Transferrin, TIBC, Ferritin and Sed Rate, CRP  - Order 500 mg Ferrlecit IV iron to be given over 2 infusions***  - Continue OTC PO iron supplements as tolerated. Take 1 tab every other day  - Continue PO B-12 supplements   - Return in 6 months for repeat labs and assessment.    History of Present Illness:   Available labs date back to 2013. First noted to be anemic 2015, with intermittent anemia since that time with Hgb 9-14 (generally normocytic). Since 2017 she has been receiving Venofer injections intermittently via [facility] and taking PO iron daily. She has some constipation requiring suppositories, but otherwise tolerates the iron ok.    She was seen by [facility] in 2017. Capsule endoscopy showed AVM in small bowel with subsequent EGD and tx of that AVM. In 10/2019 she had an episode of GIB with Hgb 7.5. Colonoscopy and EGD at that time again showed bleeding AVMs, thought to be exacerbated by anticoagulation after cardiac stent placement. Multiple AVMs were cauterized, she received 1 unit PRBCs and IV iron infusions. Has had intermittent iron deficiency anemia since that time (Hgb 9-12). Most recent labs on 05/15/21 show Hgb 12.1, MCV 96.5, plt 288, WBC 7, ferritin 28.7, iron 85, iron sat 24. Sed rate, CRP n/a. B12 and folate  have been normal historically. She is no longer on anticoagulation, does take 1 baby ASA daily.     She is post-menopausal, had a hysterectomy at 89, has had no vaginal bleeding since that time.She eats a varied diet that includes: chicken or tuna 3-5x/week, beans, leafy green vegetables. Does not have s/sx of celiac disease or inflammatory bowel disease, and does not have a hx of bariatric surgery. She is not a long distance runner or regular blood donor. She does have Hx of hepatitis, reportedly treated.    She reports ongoing acid reflux and mucousy yellow stools which she associates with chronic abx for UTI. When bowels do move they are dark, which she associates with the iron. Otherwise denies known GI bleeding (melena, hematochezia).  She denies history of bleeding complications with prior surgery, excessive mucosal bleeding or abnormal bruising.  Her energy level and concentration has been poor.  She denies dietary restrictions. She admits hair loss or brittle nails. She admits pica (ice, pretzels, popcorn). She admits shortness of breath with exertion or at rest, dizziness, syncope, chest pain. She denies RLS.    IV iron treatment history [facility]):  - Feb 2017 - Aug 2017 = 1400 mg Venofer [phone])  - Feb 2018 - Mar 2018 = 600 mg Venofer [phone])  - Apr 2021 - Sep 2021 = 1800 mg Venofer (200x9)    IV iron treatment history [facility])  - Oct 2022 - Nov 2022 = 1000 mg Ferrlecit (250x4)    Interval Hx:  Since her last visit on in 10/2021, she remains fatigued and weak, and is still having black stools, lower abd pain. Doesn't know when she last felt well. She has a new patient GI appt scheduled in August. She is taking omeprazole PRN at night. Still having hair loss and brittle nails. Ice cravings resolved after iron infusion, but continues to crave popcorn. Taking PO iron daily and tolerating it well.    Interval Hx:***  Received 500 mg Ferrlecit May/June 2023.*** Taking PO iron? ***  GI in June favors there is occult GIB r/t AVM, risks of endoscopy outweight benefits. Continue to do IV iron PRN.  Weak legged lately.   Diagnosed with ILD  Is having abd cramping.  Occasional black bowel movement, and bloody from ?hemorrhoids  Felt a little better, but still feeling poorly overall    Visit Diagnoses:  No diagnosis found.        Medical History:  Past Medical History:   Diagnosis Date    Breast cancer (CMS-HCC)     Chronic UTI     COPD (chronic obstructive pulmonary disease) (CMS-HCC)     Coronary artery disease     Diabetes CHOLECYSTECTOMY      CORONARY STENT PLACEMENT      HYSTERECTOMY      KNEE SURGERY      PR COLONOSCOPY,ABLATE LESION N/A 01/21/2017    Procedure: COLONOSCOPY FLEX; W/ABLAT LES NOT AMENABLE-SNARE;  Surgeon: Luanne Bras, MD;  Location: GI PROCEDURES [facility];  Service: Gastroenterology    PR COLSC FLX W/RMVL OF TUMOR POLYP LESION SNARE TQ N/A 01/21/2017    Procedure: COLONOSCOPY FLEX; W/REMOV TUMOR/LES BY SNARE;  Surgeon: Luanne Bras, MD;  Location: [facility];  Service: Gastroenterology    PR MASTECTOMY, PARTIAL Left 01/19/2020    Procedure: MASTECTOMY, PARTIAL (EG, LUMPECTOMY, TYLECTOMY, QUADRANTECTOMY, SEGMENTECTOMY);  Surgeon: Talbert Cage, DO;  Location: MAIN OR [facility];  Service: Surgical Oncology Breast    PR UPPER GI  ENDOSCOPY,BIOPSY N/A 04/29/2018    Procedure: UGI ENDOSCOPY; WITH BIOPSY, SINGLE OR MULTIPLE;  Surgeon: Rona Ravens, MD;  [facility];  Service: Gastroenterology    RADIATION Left     REDUCTION MAMMAPLASTY Bilateral     just a lift    REPLACEMENT TOTAL KNEE Right        No history of bleeding complications with past surgeries    Social History:  Social History     Socioeconomic History    Marital status: Divorced     Spouse name: None    Number of children: None    Years of education: None    Highest education level: None   Tobacco Use    Smoking status: Former     Packs/day: 2.00     Years: 48.00     Additional pack years: 0.00     Total pack years: 96.00     Types: Cigarettes     Quit date: 11/06/2011     Years since quitting: 10.6     Passive exposure: Past    Smokeless tobacco: Never   Vaping Use    Vaping Use: Never used   Substance and Sexual Activity    Alcohol use: No    Drug use: No    Sexual activity: Not Currently   Social History Narrative    She lives in [place]. 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 She denies alcohol and illicit drug use. quitting: 10.6     Passive exposure: Past    Smokeless tobacco: Never   Vaping Use    Vaping Use: Never used   Substance and Sexual Activity    Alcohol use: No    Drug use: No    Sexual activity: Not Currently   Social History Narrative    She lives in [place]. 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 She denies alcohol and illicit drug use.       Social Determinants of Health     Financial Resource Strain: Medium Risk (11/19/2020)    Overall Financial Resource Strain (CARDIA)     Difficulty of Paying Living Expenses: Somewhat hard   Food Insecurity: Food Insecurity Present (11/19/2020)    Hunger Vital Sign     Worried About Running Out of Food in the Last Year: Sometimes true     Ran Out of Food in the Last Year: Never true   Transportation Needs: No Transportation Needs (06/17/2021)    PRAPARE - Therapist, art (Medical): No     Lack of Transportation (Non-Medical): No       Amber Duncan was born in [place] and currently lives in [place], alone. Retired, worked as a Copy at [employer], and in Designer, fashion/clothing. Still occasionally cleans houses. Hobbies include seeing live music, going to the beach, going out to eat.     Tobacco history: Former, quit 9 years ago. Smoked for 45 years prior to that.  Alcohol history: Denies  Illicit drug history: Denies    Family History:  family history includes Cancer in her brother, brother, maternal aunt, and maternal uncle; Diabetes in her paternal uncle; No Known Problems in her brother, daughter, maternal grandfather, maternal grandmother, paternal grandfather, paternal grandmother, sister, son, and another family member; Other in her father, mother, sister, and sister.     Sister with iron deficiency anemia. No family hx  of bleeding or clotting disorders    Allergies:  Patient has no known allergies.    Medications:   Current Outpatient Medications   Medication Sig Dispense Refill    aspirin-calcium carbonate 81 mg-300 mg calcium(777 mg) Tab Take 81 mg by mouth.      cephalexin (KEFLEX) 250 MG capsule Take 1 capsule daily for UTI prevention 30 capsule 11    clonazePAM (KLONOPIN) 0.5 MG tablet TAKE UP TO 2 TABLETS TWICE DAILY AS DIRECTED 180 tablet 0    evolocumab (REPATHA SURECLICK) 140 mg/mL PnIj Inject the contents of 1 pen (140 mg) under the skin every fourteen (14) days. 2 mL 11    ferrous gluconate 324 MG tablet Take 1 tablet (324 mg total) by mouth once.      FLUoxetine (PROZAC) 20 MG capsule Take 1 capsule (20 mg total) by mouth daily. 90 capsule 3    lisinopriL (PRINIVIL,ZESTRIL) 20 MG tablet Take 1 tablet (20 mg total) by mouth daily. 90 tablet 3    metFORMIN (GLUCOPHAGE) 500 MG tablet Take 2 tablets (1,000 mg total) by mouth in the morning and 2 tablets (1,000 mg total) in the evening. Take with meals. 360 tablet 1    omeprazole 20 mg TbLD Take by mouth nightly as needed.      pravastatin (PRAVACHOL) 10 MG tablet TAKE 0.5 TABLET BY MOUTH DAILY. 45 tablet 2    SYNTHROID 100 mcg tablet Take 1 tablet (100 mcg total) by mouth daily. 90 tablet 3    docusate sodium (COLACE) 100 MG capsule Take 1 capsule (100 mg total) by mouth daily as needed.      empty container (SHARPS-A-GATOR DISPOSAL SYSTEM) Misc Use as directed for sharps disposal 1 each 2    levETIRAcetam (KEPPRA) 1000 MG tablet TAKE 1 TABLET BY MOUTH 2 TIMES DAILY 180 tablet 3     No current facility-administered medications for this visit.       Review of Systems:  As per HPI, otherwise negative x 12 systems.    Objective :  Vitals:    07/14/22 0826 07/14/22 0832   BP: 168/115 165/102   BP Site: L Arm L Arm   BP Position: Sitting Sitting   Pulse: 77    Temp: 36.4 ??C (97.5 ??F)    TempSrc: Temporal    SpO2: 98%    Weight: 86.9 kg (191 lb 9.6 oz)    Height: 167.6 cm (5' 5.98)          Physical Exam:  GEN: Well-appearing female in no acute distress.  HEENT: Normocephalic, no conjunctival injection, sclera anicteric.  RESP: Breathing unlabored  NEURO: Value Ref Range    Reticulocyte Auto % 2.33 (H) 0.50 - 2.17 %    Absolute Auto Reticulocyte 82.8 23.0 - 100.0 10*9/L   Sedimentation Rate   Result Value Ref Range    Sed Rate 23 0 - 30 mm/h   C-reactive protein   Result Value Ref Range    CRP <4.0 <=10.0 mg/L   Iron Panel   Result Value Ref Range    Iron 73 50 - 170 ug/dL    TIBC 962 952 - 841 ug/dL    Iron Saturation (%) 26 20 - 55 %   Ferritin   Result Value Ref Range    Ferritin 83.3 7.3 - 270.7 ng/mL   CBC w/ Differential   Result Value Ref Range    WBC 5.1 3.6 - 11.2 10*9/L    RBC 3.55 (L) 3.95 - 5.13  10*12/L    HGB 12.2 11.3 - 14.9 g/dL    HCT 16.1 09.6 - 04.5 %    MCV 100.8 (H) 77.6 - 95.7 fL    MCH 34.4 (H) 25.9 - 32.4 pg    MCHC 34.1 32.0 - 36.0 g/dL    RDW 40.9 81.1 - 91.4 %    MPV 7.7 6.8 - 10.7 fL    Platelet 238 150 - 450 10*9/L    Neutrophils % 44.7 %    Lymphocytes % 33.6 %    Monocytes % 9.5 %    Eosinophils % 9.8 %    Basophils % 2.4 %    Absolute Neutrophils 2.3 1.8 - 7.8 10*9/L    Absolute Lymphocytes 1.7 1.1 - 3.6 10*9/L    Absolute Monocytes 0.5 0.3 - 0.8 10*9/L    Absolute Eosinophils 0.5 0.0 - 0.5 10*9/L    Absolute Basophils 0.1 0.0 - 0.1 10*9/L       Glendell Docker, NP  [facility] Hematology (25OH) 35.2 20.0 - 80.0 ng/mL   Iron   Result Value Ref Range    Iron 141 50 - 170 ug/dL   Ferritin   Result Value Ref Range    Ferritin 218.1 7.3 - 270.7 ng/mL   Vitamin B12 Level   Result Value Ref Range    Vitamin B-12 425 211 - 911 pg/ml   POCT urinalysis dipstick   Result Value Ref Range    Color, UA Orange     Clarity, UA Turbid     Glucose, UA Trace Negative    Bilirubin, UA Negative Negative    Ketones, POC Negative Negative    Spec Grav, UA 1.020 1.005 - 1.030    Blood, UA 1+ Negative    pH, UA 6.0 5.0 - 9.0    Protein, UA 2+ Negative    Urobilinogen, UA 1.0 E.U./dL Negative (0.2 mg/dL)    Leukocytes, UA 3+ Negative    Nitrite, UA Positive Negative    STRIP LOT NUMBER .     STRIP LOT EXPIRATION .        Glendell Docker, NP  [facility] Hematology

## 2022-07-14 NOTE — Unmapped (Signed)
We're repeating your iron labs today and I'll be in touch with the results. If we need to give you additional IV iron, we will. Please decrease your iron tablet to every other day to see if this helps with your bowels.

## 2022-07-16 MED ORDER — ONETOUCH ULTRA TEST STRIPS
Freq: Once | 0 refills | 1 days | Status: CP
Start: 2022-07-16 — End: 2022-07-16

## 2022-07-16 MED ORDER — CLONAZEPAM 0.5 MG TABLET
ORAL_TABLET | 2 refills | 0 days | Status: CP
Start: 2022-07-16 — End: ?

## 2022-07-16 NOTE — Unmapped (Signed)
Patient is requesting the following refill  Requested Prescriptions     Pending Prescriptions Disp Refills    blood sugar diagnostic (ONETOUCH ULTRA TEST) Strp 100 each 0     Sig: 100 each by Other route once for 1 dose.       Recent Visits  Date Type Provider Dept   04/28/22 Office Visit [name], MD [place] [facility] [address] [facility]   01/12/22 Office Visit [name], MD [place] [facility] [address] [facility]   Showing recent visits within past 365 days with a meds authorizing provider and meeting all other requirements  Future Appointments  Date Type Provider Dept   08/27/22 Appointment [name], MD [place] [facility] [address] [facility]   Showing future appointments within next 365 days with a meds authorizing provider and meeting all other requirements       Labs: A1c:   HGB A1C, POC (%)   Date Value   08/10/2017 6.1     Hemoglobin A1C (%)   Date Value   01/12/2022 5.4

## 2022-07-17 MED ORDER — AMLODIPINE 5 MG TABLET
ORAL_TABLET | Freq: Every day | ORAL | 6 refills | 30 days | Status: CP
Start: 2022-07-17 — End: 2023-07-17

## 2022-07-28 ENCOUNTER — Ambulatory Visit: Admit: 2022-07-28 | Discharge: 2022-07-28 | Payer: PRIVATE HEALTH INSURANCE

## 2022-07-28 DIAGNOSIS — I2510 Atherosclerotic heart disease of native coronary artery without angina pectoris: Principal | ICD-10-CM

## 2022-07-28 DIAGNOSIS — R918 Other nonspecific abnormal finding of lung field: Principal | ICD-10-CM

## 2022-07-28 MED ORDER — REPATHA SURECLICK 140 MG/ML SUBCUTANEOUS PEN INJECTOR
SUBCUTANEOUS | 11 refills | 28 days | Status: CP
Start: 2022-07-28 — End: ?
  Filled 2022-08-06: qty 2, 28d supply, fill #0

## 2022-07-28 NOTE — Unmapped (Signed)
INTERVENTIONAL PULMONARY CLINIC FOLLOW UP PATIENT EVALUATION    Assessment:     Patient:Amber Duncan ([birth year])  Reason for visit: Amber Duncan is a 71 y.o.female with Breast Cancer dx 2021 (lumpectomy, XRT) who is being seen in consultation at the request of Dr. Vinson Moselle for lung nodules  - Chest CT 12/2021 showing right upper lobe nodule at 9 mm compared to chest CT in 2018 that was 8 mm  - Normal PFTs in 2019, now complaining of worsening dyspnea with worsening parenchymal lung disease  - Previous smoker of 90 pack years, quit 10 years ago  - Repeat PFTs 01/2022 essentially unchanged (DLCO underestimated)  - Following with Dr. Sharlett Iles in ILD clinic  - F/u CT 07/28/22 with stable multiple sub-centimeter LN since 2018, persistent DIP.  - Nodules present before breast cancer dx  - Continue annual screening, f/u in ILD clinic with Dr. Sharlett Iles.      Plan:   - Continue annual lung cancer screening with PCP per her preference  - F/u in IP clinic prn    Patient seen and examined with Dr. Erick Colace.    Burman Riis Dennisse Swader MD  Interventional Pulmonary Fellow  Pager: [phone]      History:     Referring Physician:   Referring, None Per Patient  [address] [place],  [place] [address]    Reason for Visit: Lung nodules    HPI:   Amber Duncan is a 71 year old female with a history of 45 years of 2 pack per day smoking, quit 10 years ago, has been undergoing LDCT for the last 5 years, presents to the interventional pulmonology clinic after the most recent chest CT showed lung nodules. Amber Duncan has a history of left sided breast cancer which Amber Duncan underwent lumpectomy 2 years ago, followed by 20 rounds of radiation, no chemotherapy. Patient endorses reflux.   Amber Duncan states her breathing is difficult because of tightness in her abdomen and chest that Amber Duncan relates to her dystonia/movement disorder. Amber Duncan does follow up with neurology where Amber Duncan is receiving Keppra and clonipine.  Amber Duncan endorses cough, mostly post prandial.   Amber Duncan states Amber Duncan's extremely tired and fatigued that getting up from chair causes dyspnea. Amber Duncan has been getting iron infusion due to anemia, pending GI work up. Amber Duncan has a history of AVM in the intestine which Amber Duncan underwent endoscopic ablation in the 90's. Amber Duncan has a history of coronary artery disease status post PCI with stent (2) with most recent being 2021, and before that 2008.  Her brother was diagnosed with stage IV lung cancer.    Interval 07/28/22;  - Feeling exhausted. 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  - Feels Amber Duncan has too many doctors, all doctored out.  - Confused by her lung disease. Spent time discussing CT results, stability over time, and nodules predating breast cancer dx.   - Amber Duncan is comfortable continuing to get nodule f/u with PCP with annual lung cancer screening.       Past Medical History:   Diagnosis Date    Breast cancer (CMS-HCC)     Chronic UTI     COPD (chronic obstructive pulmonary disease) (CMS-HCC)     Coronary artery disease     Diabetes (CMS-HCC)     Disease of thyroid gland     Eye trauma 06/17/2022    injury due to a fall on right eye    Fatigue     Hepatitis  Hepatitis     Hyperlipidemia     Hypertension     Migraine     Sleep apnea        Past Surgical History:   Procedure Laterality Date    BACK SURGERY      BREAST BIOPSY Left 11/2019    positive    BREAST LUMPECTOMY Left 01/2020    CATARACT EXTRACTION      CHOLECYSTECTOMY      CORONARY STENT PLACEMENT      HYSTERECTOMY      KNEE SURGERY      PR COLONOSCOPY,ABLATE LESION N/A 01/21/2017    Procedure: COLONOSCOPY FLEX; W/ABLAT LES NOT AMENABLE-SNARE;  Surgeon: Luanne Bras, MD;  Location: GI PROCEDURES [facility];  Service: Gastroenterology    PR COLSC FLX W/RMVL OF TUMOR POLYP LESION SNARE TQ N/A 01/21/2017    Procedure: COLONOSCOPY FLEX; W/REMOV TUMOR/LES BY SNARE;  Surgeon: Luanne Bras, MD;  Location: [facility];  Service: Gastroenterology    PR MASTECTOMY, PARTIAL Left 01/19/2020    Procedure: MASTECTOMY, PARTIAL (EG, LUMPECTOMY, TYLECTOMY, QUADRANTECTOMY, SEGMENTECTOMY);  Surgeon: Talbert Cage, DO;  Location: MAIN OR [facility];  Service: Surgical Oncology Breast    PR UPPER GI ENDOSCOPY,BIOPSY N/A 04/29/2018    Procedure: UGI ENDOSCOPY; WITH BIOPSY, SINGLE OR MULTIPLE;  Surgeon: Rona Ravens, MD;  Location: [facility];  Service: Gastroenterology    RADIATION Left     REDUCTION MAMMAPLASTY Bilateral     just a lift    REPLACEMENT TOTAL KNEE Right        Current Outpatient Medications   Medication Sig Dispense Refill    amlodipine (NORVASC) 5 MG tablet Take 1 tablet (5 mg total) by mouth daily. 30 tablet 6    aspirin-calcium carbonate 81 mg-300 mg calcium(777 mg) Tab Take 81 mg by mouth.      cephalexin (KEFLEX) 250 MG capsule Take 1 capsule daily for UTI prevention 30 capsule 11    clonazePAM (KLONOPIN) 0.5 MG tablet TAKE UP TO 2 TABLETS TWICE DAILY AS DIRECTED 120 tablet 2    docusate sodium (COLACE) 100 MG capsule Take 1 capsule (100 mg total) by mouth daily as needed.      empty container (SHARPS-A-GATOR DISPOSAL SYSTEM) Misc Use as directed for sharps disposal 1 each 2    evolocumab (REPATHA SURECLICK) 140 mg/mL PnIj Inject the contents of 1 pen (140 mg) under the skin every fourteen (14) days. 2 mL 11    ferrous gluconate 324 MG tablet Take 1 tablet (324 mg total) by mouth once.      FLUoxetine (PROZAC) 20 MG capsule Take 1 capsule (20 mg total) by mouth daily. 90 capsule 3    levETIRAcetam (KEPPRA) 1000 MG tablet TAKE 1 TABLET BY MOUTH 2 TIMES DAILY 180 tablet 3    lisinopriL (PRINIVIL,ZESTRIL) 20 MG tablet Take 1 tablet (20 mg total) by mouth daily. 90 tablet 3    metFORMIN (GLUCOPHAGE) 500 MG tablet Take 2 tablets (1,000 mg total) by mouth in the morning and 2 tablets (1,000 mg total) in the evening. Take with meals. 360 tablet 1    omeprazole 20 mg TbLD Take by mouth nightly as needed.      pravastatin (PRAVACHOL) 10 MG tablet TAKE 0.5 TABLET BY MOUTH DAILY. 45 tablet 2    SYNTHROID 100 mcg tablet Take 1 tablet (100 mcg total) by mouth daily. 90 tablet 3     No current facility-administered medications for this visit.  Allergies as of 07/28/2022    (No Known Allergies)       Family History   Problem Relation Age of Onset    Other Mother         MVA    Other Father         MVA    No Known Problems Sister     Other Sister         MVA    Other Sister         MVA drunk driver    Cancer Brother         liver    Cancer Brother         Lung    No Known Problems Brother     Cancer Maternal Aunt     Cancer Maternal Uncle     Diabetes Paternal Uncle     No Known Problems Maternal Grandmother     No Known Problems Maternal Grandfather     No Known Problems Paternal Grandmother     No Known Problems Paternal Grandfather     No Known Problems Daughter     No Known Problems Son     No Known Problems Other     Heart failure Neg Hx     BRCA 1/2 Neg Hx     Breast cancer Neg Hx     Colon cancer Neg Hx     Endometrial cancer Neg Hx     Ovarian cancer Neg Hx     Glaucoma Neg Hx     Macular degeneration Neg Hx        Social History     Socioeconomic History    Marital status: Divorced   Tobacco Use    Smoking status: Former     Packs/day: 2.00     Years: 48.00     Additional pack years: 0.00     Total pack years: 96.00     Types: Cigarettes     Quit date: 11/06/2011     Years since quitting: 10.7     Passive exposure: Past    Smokeless tobacco: Never   Vaping Use    Vaping Use: Never used   Substance and Sexual Activity    Alcohol use: No    Drug use: No    Sexual activity: Not Currently   Social History Narrative    Amber Duncan lives in [place]. 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 Amber Duncan denies alcohol and illicit drug use.       Social Determinants of Health     Financial Resource Strain: Medium Risk (11/19/2020)    Overall Financial Resource Strain (CARDIA)     Difficulty of Paying Living Expenses: Somewhat hard   Food Insecurity: Food Insecurity Present (11/19/2020)    Hunger Vital Sign     Worried About Running Out of Food in the Last Year: Sometimes true     Ran Out of Food in the Last Year: Never true   Transportation Needs: No Transportation Needs (06/17/2021)    PRAPARE - Therapist, art (Medical): No     Lack of Transportation (Non-Medical): No     Review of Systems  A 12 point review of systems was negative except for pertinent items noted in the HPI.    Objective:     Physical Examination:   General appearance - alert, well appearing, and in no distress  Vitals:   Vitals:  07/28/22 1058   BP: 146/73   Pulse: 90   Resp: 16   Temp: 36.6 ??C (97.9 ??F)   SpO2: 96%       Eyes - Sclera anicteric, conjunctiva pink  Mouth - mucous membranes moist, pharynx normal without lesions  Neck - Trachea supple and midline, (-) JVD, Mallampati 1, struggling voice  Lymphatics - no palpable lymphadenopathy  Heart - normal rate, regular rhythm, normal S1, S2, no murmurs, rubs, clicks or gallops  Chest - clear to auscultation, no wheezes, rales or rhonchi, symmetric air entry  Abdomen - soft, nontender, nondistended, no masses or organomegaly  Extremities - No pedal edema, no clubbing or cyanosis  Skin - normal coloration and turgor, no rashes, no suspicious skin lesions noted  Neurological - alert, oriented, normal speech, no focal findings or movement disorder noted    Labs and Imaging:   All pertinent labs and images were reviewed    CT Chest 07/28/22  Impression      1.Unchanged scattered subcentimeter pulmonary nodules. No new, growing or suspicious pulmonary nodule.   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

## 2022-07-28 NOTE — Unmapped (Signed)
Nice to see you today. No further follow-up needed with Interventional Pulmonary clinic. Please keep getting anual lung nodule screening with your primary care doctor.      Thank you for allowing the [facility] Interventional Pulmonology team to participate in your care. Please do not hesitate to contact us with questions or concerns.     Epic releases test results to MyChart as soon as they are available which means you will see your test/biopsy results before I do. Please keep in mind that our team will call you a week after any biopsy procedure to discuss your results and to formulate a plan.     The best way to reach me for non-urgent matters is through MyChart. I usually respond within one business day but I do not check messages after hours including evenings, weekends, and holidays.     During business hours please contact me via our nurse navigator, Leisa Lenz, at [phone] or page her at [phone].    For scheduling questions or concerns, please call our administrative specialist, Darrel Reach, at [phone].    For urgent issues after hours please call [phone] [facility] Operator) and ask to speak with the Interventional Pulmonary Fellow on call.    Interventional Pulmologists:  Barbara Cower A. Erick Colace MD  A. Mardene Speak, MD  Ernest Haber. MacRosty, DO    Interventional Pulmonary Fellow: Mycah Formica MD

## 2022-08-06 NOTE — Unmapped (Signed)
[facility] Refill Coordination Note    Specialty Medication(s) to be Shipped:   General Specialty: Repatha    Other medication(s) to be shipped: No additional medications requested for fill at this time     Amber Duncan, DOB: 02-Dec-1950  Phone: [phone] (home)       All above HIPAA information was verified with patient.     Was a Nurse, learning disability used for this call? No    Completed refill call assessment today to schedule patient's medication shipment from the [facility] [phone]).  All relevant notes have been reviewed.     Specialty medication(s) and dose(s) confirmed: Regimen is correct and unchanged.   Changes to medications: Attalia reports no changes at this time.  Changes to insurance: No  New side effects reported not previously addressed with a pharmacist or physician: None reported  Questions for the pharmacist: No    Confirmed patient received a Conservation officer, historic buildings and a Surveyor, mining with first shipment. The patient will receive a drug information handout for each medication shipped and additional FDA Medication Guides as required.       DISEASE/MEDICATION-SPECIFIC INFORMATION        For patients on injectable medications: Patient currently has 0 doses left.  Next injection is scheduled for 11/30.    SPECIALTY MEDICATION ADHERENCE     Medication Adherence    Patient reported X missed doses in the last month: 0  Specialty Medication: REPATHA SURECLICK 140 mg/mL  Patient is on additional specialty medications: No  Patient is on more than two specialty medications: No  Any gaps in refill history greater than 2 weeks in the last 3 months: no  Demonstrates understanding of importance of adherence: yes  Informant: patient  Reliability of informant: reliable              Confirmed plan for next specialty medication refill: delivery by pharmacy  Refills needed for supportive medications: not needed              Were doses missed due to medication being on hold? No    REPATHA SURECLICK 140 mg/mL Pnij (evolocumab)  : 0 days of medicine on hand        REFERRAL TO PHARMACIST     Referral to the pharmacist: Not needed      [facility]     Shipping address confirmed in Epic.     Delivery Scheduled: Yes, Expected medication delivery date: 11/30.     Medication will be delivered via Same Day Courier to the prescription address in Epic WAM.    Valere Dross   [facility] Specialty Technician

## 2022-08-10 ENCOUNTER — Ambulatory Visit: Admit: 2022-08-10 | Discharge: 2022-08-10 | Payer: PRIVATE HEALTH INSURANCE

## 2022-08-10 NOTE — Unmapped (Signed)
Referring Physician: Melody Haver    Reason for consult: I am seeing Amber Duncan in consultation for Dr. Oda Kilts  to evaluate a possible interstitial lung disease and advice on diagnostic testing and treatment options      HPI   Presenting hx: The patient is a 71 yo with Pmhx of tobacco abuse (45 years x 2 ppd, quit 2013), L breast cancer s/p lumpectomy and xrt,  who presents with a complaint of abnormal chest CT and DOE over last several months. Patient states she has been in a cancer screening program for the last 5-6 years and they had been following lung nodules. This year, a nodule was enlarging and there was evidence of worsening fibrosis in the lung. Patient states she used to have periodic shortness of breath where she feels like she is suffocating and having chest tightness (squeezing around the diaphragm). Patient states she get short of breath when walking which was also made worse by leg weakness. Nothing seems to improve the breathing. Patient was diagnosed with cervical spine myoclonus and has related severe chest discomfort and voice changes. She is on Keppra on klonopin which doesn't seem to help. She coughs most days and is both non-productive and productive. Sputum is clear. No hemoptysis. Minimal LE edema but does get orthopnea. No noticeable weight changes. She first noticed issues with her breathing and chest tightness 7 years ago and has been getting progressively worse.   She denies any prior history of lung problems. She is a former smoker and smoked 2ppd x 45 years. Quit 2013. No vaping or e-cigarettes. No marijuana use. She is retired. 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 Lives in [place], [place] in a mobile home. No known water damage or mold. She has lived there for 7 years. No pets. No birds. She had breast cancer 2 years ago and is s/p lumpectomy and 20 rounds of radiation. No chemotherapy. She is in remission. She endorsed fatigue, pain, dysphagia, dry mouth, GERD. No organic antigen exposures.     Since last clinic visit, the patient reports ***      PMH   Past Medical History:   Diagnosis Date    Breast cancer (CMS-HCC)     Chronic UTI     COPD (chronic obstructive pulmonary disease) (CMS-HCC)     Coronary artery disease     Diabetes (CMS-HCC)     Disease of thyroid gland     Eye trauma 06/17/2022    injury due to a fall on right eye    Fatigue     Hepatitis     Hepatitis     Hyperlipidemia     Hypertension     Migraine     Sleep apnea            FH   - Brother with lung cancer in remission  - Uncle with lung cancer   - No family history of lung fibrosis. Parents died young in a car wreck   Family History   Problem Relation Age of Onset    Other Mother         MVA    Other Father         MVA    No Known Problems Sister     Other Sister  MVA    Other Sister         MVA drunk driver    Cancer Brother         liver    Cancer Brother         Lung    No Known Problems Brother     Cancer Maternal Aunt     Cancer Maternal Uncle     Diabetes Paternal Uncle     No Known Problems Maternal Grandmother     No Known Problems Maternal Grandfather     No Known Problems Paternal Grandmother     No Known Problems Paternal Grandfather     No Known Problems Daughter     No Known Problems Son     No Known Problems Other     Heart failure Neg Hx     BRCA 1/2 Neg Hx     Breast cancer Neg Hx     Colon cancer Neg Hx     Endometrial cancer Neg Hx     Ovarian cancer Neg Hx     Glaucoma Neg Hx     Macular degeneration Neg Hx           Social History     Socioeconomic History    Marital status: Divorced   Tobacco Use    Smoking status: Former     Current packs/day: 0.00     Average packs/day: 2.0 packs/day for 48.0 years (96.0 ttl pk-yrs)     Types: Cigarettes     Start date: 11/06/1963     Quit date: 11/06/2011     Years since quitting: 10.7     Passive exposure: Past    Smokeless tobacco: Never   Vaping Use    Vaping Use: Never used   Substance and Sexual Activity    Alcohol use: No    Drug use: No    Sexual activity: Not Currently   Social History Narrative    She lives in [place]. 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 She denies alcohol and illicit drug use.       Social Determinants of Health     Financial Resource Strain: Medium Risk (11/19/2020)    Overall Financial Resource Strain (CARDIA)     Difficulty of Paying Living Expenses: Somewhat hard   Food Insecurity: Food Insecurity Present (11/19/2020)    Hunger Vital Sign     Worried About Running Out of Food in the Last Year: Sometimes true     Ran Out of Food in the Last Year: Never true   Transportation Needs: No Transportation Needs (06/17/2021)    PRAPARE - Therapist, art (Medical): No     Lack of Transportation (Non-Medical): No          MEDS (personally reviewed in EPIC, pertinent meds noted below)     PHYSICAL EXAM   There were no vitals taken for this visit.      General Appearance:   Well-developed, well-nourished, comfortable, no distress, appears stated age.   Eyes:  PERRL, conjunctiva clear, EOM's intact. No drainage.   Ears:  Normal external ears   Nose: Nares normal, septum midline, mucosa normal, no drainage    or sinus tenderness, no polyps   Oropharynx: Moist mucus membranes without lesions or thrush.  Good dentition.  Posterior pharynx clear.   Neck:  Supple. Trachea midline. No masses.   Respiratory:   Clear breath sounds  bilaterally.  No crackles, wheezes, or rhonchi.  No accessory muscle use. Symmetric chest expansion.    Cardiovascular:  Regular rate and rhythm. S1 and S2 normal. No murmur, rub  or gallop.  No JVD.  No peripheral edema   Gastrointestinal:   Abdomen soft, non-tender, and non-distended   Musculoskeletal: No tenderness or deformity of the chest wall.  Joints normal without swelling.   No clubbing or cyanosis. Normal tone   Skin:  No rashes, lesions, skin changes.  No bruising or petechiae.       Neurologic/Psychiatric:  Alert and oriented x3. Appropriate affect. No focal neurological deficits.  Normal gait and motor function.       RESULTS     Labs (reviewed in EPIC, pertinent values noted below)     Autoimmune serologies/markers:     ANA, RF, CCP, Anti-myositis panel, ENA, CK, Aldolase, CRP, sed rate, ANCA    The following were within normal limits:  5/23: CRP, aldolase, RF, ENA, CCP  7/23: igG, aldolase, CK, ANCA    The following were abnormal:    5/23: myositis panel + U1 RNP), ANA 80 homog    PFTs (personally reviewed and interpreted)     Date: FVC (% Pred) FEV1 (% Pred) ratio TLC DLCO low sat dist   01/26/22 2.57 (94) 1.75 (83) no bd resp 68%  12.33 (64) 98% 373 m   08/10/22 2.61 (94) 1.85 (87) 71%  12.42 (65) 98% 214 m                                                             I personally reviewed the following studies:    Chest CT 07/28/22: LUNGS, AIRWAYS, AND PLEURA: Unchanged basilar predominant groundglass diffuse bilateral peripheral reticulation and mild basilar interlobular septal thickening. No honeycombing or traction bronchiectasis. Post radiation fibrosis in the subpleural left upper lobe. Unchanged subcentimeter benign-appearing pulmonary nodules. For reference a dominant 9 mm perifissural nodule along the right minor fissure (3:353) likely a intrafissural lymph node, a 4 mm nodule in the left upper lobe (3:277), a 5 mm nodule in the left lower lobe (3:513). No new or enlarging pulmonary nodule. Central airways are patent. No pleural effusion or pneumothorax.MEDIASTINUM AND LYMPH NODES: No enlarged intrathoracic lymph nodes. Small sliding type hiatal hernia.        Chest CT 01/13/22: c/w 06/14/17 9 x 9 x 5 mm right middle lobe perifissural nodule previously 7 x 6 x 3 mm. 7 mm nodule periphery left lower lobe image 204 series 3. Minimal groundglass opacities lung bases. Increased mild to moderate peripheral septal thickening and reticular opacities.        Assessment/Plan    ILD (interstitial lung disease) (CMS-HCC)  The etiology of the patient's Interstitial Lung Disease is unknown. . Based upon the patient's history, exam, and diagnostic studies to date, I favor a diagnosis of Idiopathic interstitial pneumonia (IPF >NSIP> smoking related ILD- though slow progressing), an antisynthetase syndrome (+ SRM ab)      -reviewed lab tests -ck,aldolase, IgG, ANCA - wnl  - repeat PFTs show stability over last 7 months  - referred to pulm rehab  - consider other treatment based upon new data     Lung nodules  -  follow up with LDCT lung screening per  IP recs with PCP      PCP Proph:  - not indicated    Bone Health  - Will defer to PCP  - Last Dexa:    Vaccines  Immunization History   Administered Date(s) Administered Comments    COVID-19 VACC,MRNA,(PFIZER)(PF) 10/20/2019 Historical - Not administered in Epic     11/10/2019 Historical - Not administered in Epic     05/31/2020 [facility]87    INFLUENZA INJ MDCK PF, QUAD,(FLUCELVAX)(54MO AND UP EGG FREE) 05/15/2021     INFLUENZA QUAD HIGH DOSE 67YRS+(FLUZONE) 06/02/2016      05/24/2018      04/27/2019     INFLUENZA TIV (TRI) 54MO+ W/ PRESERV (IM) 05/26/2013      06/10/2014     INFLUENZA TIV (TRI) PF (IM) 05/23/2011      05/30/2012     Influenza Virus Vaccine, unspecified formulation 06/18/2015      06/14/2017      05/17/2020     Influenza Whole 06/07/2012     PNEUMOCOCCAL POLYSACCHARIDE 23-VALENT 09/08/2007      02/21/2016      04/27/2019     Pneumococcal Conjugate 13-Valent 09/10/2014      02/09/2018     ZOSTAVAX - ZOSTER VACCINE, LIVE, SQ 04/27/2011         *** min was spent with the patient face to face and *** min was spent reviewing chart/imaging.    Complex medical decision making was performed. 54MO+ W/ PRESERV (IM) 05/26/2013      06/10/2014     INFLUENZA TIV (TRI) PF (IM) 05/23/2011      05/30/2012     Influenza Virus Vaccine, unspecified formulation 06/18/2015      06/14/2017      05/17/2020     Influenza Whole 06/07/2012     PNEUMOCOCCAL POLYSACCHARIDE 23-VALENT 09/08/2007      02/21/2016      04/27/2019     Pneumococcal Conjugate 13-Valent 09/10/2014      02/09/2018     ZOSTAVAX - ZOSTER VACCINE, LIVE, SQ 04/27/2011         30 min was spent with the patient face to face and 10 min was spent reviewing chart/imaging.    Complex medical decision making was performed.

## 2022-08-11 ENCOUNTER — Ambulatory Visit: Admit: 2022-08-11 | Discharge: 2022-08-11 | Payer: PRIVATE HEALTH INSURANCE

## 2022-08-11 DIAGNOSIS — R918 Other nonspecific abnormal finding of lung field: Principal | ICD-10-CM

## 2022-08-11 DIAGNOSIS — D500 Iron deficiency anemia secondary to blood loss (chronic): Principal | ICD-10-CM

## 2022-08-11 DIAGNOSIS — G253 Myoclonus: Principal | ICD-10-CM

## 2022-08-11 DIAGNOSIS — J849 Interstitial pulmonary disease, unspecified: Principal | ICD-10-CM

## 2022-08-11 LAB — CBC W/ AUTO DIFF
BASOPHILS ABSOLUTE COUNT: 0.1 10*9/L (ref 0.0–0.1)
BASOPHILS RELATIVE PERCENT: 1.1 %
EOSINOPHILS ABSOLUTE COUNT: 0.3 10*9/L (ref 0.0–0.5)
EOSINOPHILS RELATIVE PERCENT: 4.6 %
HEMATOCRIT: 38.5 % (ref 34.0–44.0)
HEMOGLOBIN: 13.3 g/dL (ref 11.3–14.9)
LYMPHOCYTES ABSOLUTE COUNT: 2.2 10*9/L (ref 1.1–3.6)
LYMPHOCYTES RELATIVE PERCENT: 32.4 %
MEAN CORPUSCULAR HEMOGLOBIN CONC: 34.4 g/dL (ref 32.0–36.0)
MEAN CORPUSCULAR HEMOGLOBIN: 33.8 pg — ABNORMAL HIGH (ref 25.9–32.4)
MEAN CORPUSCULAR VOLUME: 98.3 fL — ABNORMAL HIGH (ref 77.6–95.7)
MEAN PLATELET VOLUME: 7.6 fL (ref 6.8–10.7)
MONOCYTES ABSOLUTE COUNT: 0.5 10*9/L (ref 0.3–0.8)
MONOCYTES RELATIVE PERCENT: 7.5 %
NEUTROPHILS ABSOLUTE COUNT: 3.8 10*9/L (ref 1.8–7.8)
NEUTROPHILS RELATIVE PERCENT: 54.4 %
NUCLEATED RED BLOOD CELLS: 0 /100{WBCs} (ref <=4)
PLATELET COUNT: 252 10*9/L (ref 150–450)
RED BLOOD CELL COUNT: 3.92 10*12/L — ABNORMAL LOW (ref 3.95–5.13)
RED CELL DISTRIBUTION WIDTH: 12.7 % (ref 12.2–15.2)
WBC ADJUSTED: 6.9 10*9/L (ref 3.6–11.2)

## 2022-08-11 LAB — FERRITIN: FERRITIN: 91.8 ng/mL

## 2022-08-11 NOTE — Unmapped (Signed)
I recommend receiving the flu vaccine in the fall.     Thank you for allowing me to be a part of your care. Please call the clinic with any questions.    Yevette Knust MD   Pulmonary and Critical Care Medicine  [place], [place]     Thank you for your visit to the [facility]. You may receive a survey from [facility] regarding your visit today, and we are eager to use this feedback to improve your experience and provide outstanding care. Thank you for taking the time to fill it out.    Between appointments, you can reach us at these numbers:    For clinical questions: ILD nurse coordinator Liesl Jeffreys [phone]  For appointments: [phone]  Fax: [phone]

## 2022-08-12 NOTE — Unmapped (Signed)
Patient called about speaking to doctor/ nurse about her symptoms getting worse and medication. She was transferred to the nurse line.

## 2022-08-12 NOTE — Unmapped (Signed)
Pt left message on nurse line stating she had labs done at her pulmonology appt yesterday, results are in Epic. Pulmonologist felt pt was anemic. Pt is inquiring if she is need of infusion.

## 2022-08-13 MED ORDER — LEVETIRACETAM 250 MG TABLET
ORAL_TABLET | Freq: Two times a day (BID) | ORAL | 3 refills | 90 days | Status: CP
Start: 2022-08-13 — End: 2023-08-13

## 2022-08-13 NOTE — Unmapped (Signed)
Pt called to inform provider that she is having a bad episode.  Saw her pulm MD who suggested reaching out to NEU MD to discuss potential changes to current meds to asst with issues

## 2022-08-14 MED ORDER — CLONAZEPAM 0.5 MG TABLET
ORAL_TABLET | 2 refills | 0 days | Status: CP
Start: 2022-08-14 — End: ?

## 2022-08-14 MED ORDER — AMLODIPINE 5 MG TABLET
ORAL_TABLET | Freq: Every day | ORAL | 6 refills | 30 days
Start: 2022-08-14 — End: 2023-08-14

## 2022-08-14 NOTE — Unmapped (Signed)
I spoke with Ms. Kingma. She has been having worsening of her breathing for several weeks, starting just prior to our last appointment. Also the dizziness she had described at that visit has not improved, in fact may be worse. Reducing the Clonazepam at our last visit (which we did due to complaints of dizziness and excessive weakness/fatigue) did not make a difference in her symptoms. She saw pulmonologist this week, PFTs were not changed so she was advised that her worsening is not primarily pulmonary. Repeat CBC and ferritin are normal. I advised that she see her PCP to investigate alternative medical problems (dehydration, infection, electrolyte imbalance, etc) that may be unmasking these symptoms. I doubt primary neurologic cause of her worsening as her exam while symptomatic was non-focal, and brain imaging did not reveal stroke or other neurologic abnormality to cause dizziness.     In the meantime, I will increase Keppra to 1250mg  BID in case this helps the breathing, though I warned that I worry this may worsen her dizziness, as polypharmacy may be contributing.

## 2022-08-14 NOTE — Unmapped (Signed)
Addended by: Morene Rankins on: 08/13/2022 04:21 PM     Modules accepted: Orders

## 2022-08-15 NOTE — Unmapped (Signed)
Refill request received from patient.      Medication Requested: clonazepam 0.5 mg  Last Office Visit: 06/23/2022   Next Office Visit: 01/26/2023  Last Prescriber: Dr. Nadara ModeSklerov    Nurse refill requirements met? No  If not met, why: Controlled substance, no SOP    Sent to: Pharmacy per protocol  If sent to provider, which provider?: ***

## 2022-08-17 MED ORDER — AMLODIPINE 5 MG TABLET
ORAL_TABLET | Freq: Every day | ORAL | 6 refills | 30 days | Status: CP
Start: 2022-08-17 — End: 2023-08-17

## 2022-08-17 NOTE — Unmapped (Signed)
Duplicate request please deny recently filled 07/17/22

## 2022-08-24 NOTE — Unmapped (Unsigned)
Assessment/Plan:    There are no diagnoses linked to this encounter.      No follow-ups on file.    Subjective:     HPI  The patient is seen today for routine follow-up visit.  I last saw the patient in August/2023 with the following medical conditions:      -Diabetes mellitus with complications  CMP done in 10/2021 revealed normal serum electrolytes, creatinine 1.0, glucose of 141 and normal LFTs.   DM has been well controlled. Continue metformin 1000 mg BID and diabetic diet.  Repeat HgA1c in May/2023 was 5.4.  She is due for repeat A1c today.    -CAD/dyslipidemia: she is followed by [facility] cardiology and last seen in 09/2021 and encounter details reviewed with the pt today.    She continues on Repatha and low dose pravastatin as previously directed.  Her LDL cholesterol was 67 in 16/1096 and LFT's were normal in 10/2021. She will see cardiology in 1 yr. She is due for annual lipid panel today.      -left sided Breast cancer history: infiltrating ductal carcinoma, estrogen receptor positive, progesterone receptor positive and HER2 negative s/p lumpectomy in 01/2020 and follow up radiation Rx completed in 02/2020.   She is   Followed annually by breast oncology, seen early this year. She had a normal  mammogram and breast US in 09/2021. She will follow up with oncology in 1 yrs time.     -Dysuria with history of chronic UTIs and microscopic hematuria   She was referred to [facility] urology and she was seen in  04/2021 and advised to follow up in 3 months time/she has not followed up as recommended.  She was started on macrodantin 100 mg daily for UTI prevention, but stopped due to side effects. She had cystoscopy last year which was normal. She was to start estrace vaginal cream twice a week and Keflex 250 mg 1 tablet daily for UTI prevention.  She continues daily D Mannose and cranberry tablet, regimen which has helped as well.  Patient wishes to defer urology follow-up since she does not have UTI recurrence if she remains on this current regimen.     -History of anemia - chronic iron def anemia  The pt is followed by [facility] hematology and last visit was in 01/2022 and  encounter details reviewed with the pt today. She has a history of AVM's with intermittent bleeding.  She is now followed by [facility] GI and seen in June/2023.  She has had several IV infusions ( a total of 4 infusions in 2022).  CBC done in May/2023 revealed stable H/H of 11/33.7 with MCV of 100.  Normal white blood cell and platelet count.  She was referred for repeat iron infusion in May/2023.   EGD and colonoscopy were done in 2021 in the setting of acute GI bleed.  Bleeding was attributed to blood thinner and AVMs.  She is on low dose daily enteric aspirin instead, as per cardiology recommendation.      - chronic fatigue symptoms: possibly attributed to chronic anemia.  She is followed by hematology.  Vitamin D level was normal in August/2023 TSH was normal in May/2023.      -History of AVM related GI bleeding: First noted in 2018.  Patient was seen by GI in June/2023 and encounter details reviewed with the patient today.  She was advised that the patient continue iron infusion as recommended by hematology she will continue on daily p.o. iron supplement as scheduled.        -  Hypothyroidism: pt continues on levothyroxine supplementation as directed. Her TSH was normal in 01/2022.    -Hypertension:  Patient remains compliant with her current medication regimen.  She has been counseled on maintaining low-salt and low-fat dietary intake and exercise routine.    CMP done 10/2021 revealed creatinine level of 1 with normal serum electrolytes and LFT's.    - Cervical level spinal myoclonus, Dysphonia spastica and migraine headache history:  She is followed by [facility] neurology and last seen in 11/2021 and encounter details reviewed with the pt today.  Was advised to continue with her current medication regimen.  Dysphonia may be secondary to low iron levels.  She was advised to follow-up with neurology in 6 months time.     -  depression: Pt on  Prozac recently increased to 20 mg daily on her last visit in August/2023.      - history of COPD/ILD and pulmonary nodules: pt is part of a lung cancer screening study and she had a recent surveillance chest CT recently which revealed increase in lung nodule size and she is referred to [facility] pulmonary clinic for further evaluation.  She was seen by pulmonary in July/2023.  She had PFTs done in December/2023.  Noncontrast follow-up chest CT was done in November/2023 and revealed unchanged pulmonary nodule appearance and smoking related lung disease unchanged from prior imaging.  Pulmonary rehab at [facility] was discussed.          ROS  Constitutional:  Denies  unexpected weight loss or gain, or weakness   Eyes:  Denies visual changes  Respiratory:  Denies cough or shortness of breath. No change in exercise  tolerance  Cardiovascular:  Denies chest pain, palpitations or lower extremity swelling   GI:  Denies abdominal pain, diarrhea, constipation   Musculoskeletal:  Denies myalgias  Skin:  Denies nonhealing lesions  Neurologic:  Denies headache, focal weakness or numbness, tingling  Endocrine:  Denies polyuria or polydypsia   Psychiatric:  Denies depression, anxiety      Outpatient Medications Prior to Visit   Medication Sig Dispense Refill    amlodipine (NORVASC) 5 MG tablet Take 1 tablet (5 mg total) by mouth daily. 30 tablet 6    aspirin-calcium carbonate 81 mg-300 mg calcium(777 mg) Tab Take 81 mg by mouth.      cephalexin (KEFLEX) 250 MG capsule Take 1 capsule daily for UTI prevention 30 capsule 11    clonazePAM (KLONOPIN) 0.5 MG tablet Take 3 tabs per day as directed 90 tablet 2    docusate sodium (COLACE) 100 MG capsule Take 1 capsule (100 mg total) by mouth daily as needed.      empty container (SHARPS-A-GATOR DISPOSAL SYSTEM) Misc Use as directed for sharps disposal 1 each 2    evolocumab (REPATHA SURECLICK) 140 mg/mL PnIj Inject the contents of 1 pen (140 mg) under the skin every fourteen (14) days. 2 mL 11    ferrous gluconate 324 MG tablet Take 1 tablet (324 mg total) by mouth once.      FLUoxetine (PROZAC) 20 MG capsule Take 1 capsule (20 mg total) by mouth daily. 90 capsule 3    levETIRAcetam (KEPPRA) 1000 MG tablet TAKE 1 TABLET BY MOUTH 2 TIMES DAILY 180 tablet 3    levETIRAcetam (KEPPRA) 250 MG tablet Take 1 tablet (250 mg total) by mouth two (2) times a day. 180 tablet 3    lisinopriL (PRINIVIL,ZESTRIL) 20 MG tablet Take 1 tablet (20 mg total) by mouth daily. 90 tablet 3  metFORMIN (GLUCOPHAGE) 500 MG tablet Take 2 tablets (1,000 mg total) by mouth in the morning and 2 tablets (1,000 mg total) in the evening. Take with meals. 360 tablet 1    omeprazole 20 mg TbLD Take by mouth nightly as needed.      pravastatin (PRAVACHOL) 10 MG tablet TAKE 0.5 TABLET BY MOUTH DAILY. 45 tablet 2    SYNTHROID 100 mcg tablet Take 1 tablet (100 mcg total) by mouth daily. 90 tablet 3     No facility-administered medications prior to visit.         Objective:       Vital Signs  There were no vitals taken for this visit.     Exam  General: normal appearance  EYES: Anicteric sclerae.  ENT: Oropharynx moist.  RESP: Relaxed respiratory effort. Clear to auscultation without wheezes or crackles.   CV: Regular rate and rhythm. Normal S1 and S2. No murmurs or gallops.  No lower extremity edema. Posterior tibial pulses are 2+ and symmetric.  abd exam: non tender, no masses, no HSM   MSK: No focal muscle tenderness.  SKIN: Appropriately warm and moist.  NEURO: Stable gait and coordination.    Allergies:     Patient has no known allergies.    Current Medications:     Current Outpatient Medications   Medication Sig Dispense Refill    amlodipine (NORVASC) 5 MG tablet Take 1 tablet (5 mg total) by mouth daily. 30 tablet 6    aspirin-calcium carbonate 81 mg-300 mg calcium(777 mg) Tab Take 81 mg by mouth.      cephalexin (KEFLEX) 250 MG capsule Take 1 capsule daily for UTI prevention 30 capsule 11    clonazePAM (KLONOPIN) 0.5 MG tablet Take 3 tabs per day as directed 90 tablet 2    docusate sodium (COLACE) 100 MG capsule Take 1 capsule (100 mg total) by mouth daily as needed.      empty container (SHARPS-A-GATOR DISPOSAL SYSTEM) Misc Use as directed for sharps disposal 1 each 2    evolocumab (REPATHA SURECLICK) 140 mg/mL PnIj Inject the contents of 1 pen (140 mg) under the skin every fourteen (14) days. 2 mL 11    ferrous gluconate 324 MG tablet Take 1 tablet (324 mg total) by mouth once.      FLUoxetine (PROZAC) 20 MG capsule Take 1 capsule (20 mg total) by mouth daily. 90 capsule 3    levETIRAcetam (KEPPRA) 1000 MG tablet TAKE 1 TABLET BY MOUTH 2 TIMES DAILY 180 tablet 3    levETIRAcetam (KEPPRA) 250 MG tablet Take 1 tablet (250 mg total) by mouth two (2) times a day. 180 tablet 3    lisinopriL (PRINIVIL,ZESTRIL) 20 MG tablet Take 1 tablet (20 mg total) by mouth daily. 90 tablet 3    metFORMIN (GLUCOPHAGE) 500 MG tablet Take 2 tablets (1,000 mg total) by mouth in the morning and 2 tablets (1,000 mg total) in the evening. Take with meals. 360 tablet 1    omeprazole 20 mg TbLD Take by mouth nightly as needed.      pravastatin (PRAVACHOL) 10 MG tablet TAKE 0.5 TABLET BY MOUTH DAILY. 45 tablet 2    SYNTHROID 100 mcg tablet Take 1 tablet (100 mcg total) by mouth daily. 90 tablet 3     No current facility-administered medications for this visit.           Note - This record has been created using [employer]. Chart creation errors have been sought, but may  not always have been located. Such creation errors do not reflect on the standard of medical care.    [name], MD

## 2022-08-28 NOTE — Unmapped (Signed)
[facility] Refill Coordination Note    Specialty Medication(s) to be Shipped:   General Specialty: Repatha    Other medication(s) to be shipped: No additional medications requested for fill at this time     Amber Duncan, DOB: [birth year]  Phone: [phone] (home)       All above HIPAA information was verified with patient.     Was a Nurse, learning disability used for this call? No    Completed refill call assessment today to schedule patient's medication shipment from the [facility] [phone]).  All relevant notes have been reviewed.     Specialty medication(s) and dose(s) confirmed: Regimen is correct and unchanged.   Changes to medications: Hajira reports no changes at this time.  Changes to insurance: No  New side effects reported not previously addressed with a pharmacist or physician: None reported  Questions for the pharmacist: No    Confirmed patient received a Conservation officer, historic buildings and a Surveyor, mining with first shipment. The patient will receive a drug information handout for each medication shipped and additional FDA Medication Guides as required.       DISEASE/MEDICATION-SPECIFIC INFORMATION        For patients on injectable medications: Patient currently has 0 doses left.  Next injection is scheduled for 09/03/22.    SPECIALTY MEDICATION ADHERENCE     Medication Adherence    Patient reported X missed doses in the last month: 0  Specialty Medication: REPATHA SURECLICK 140 mg/mL Pnij (evolocumab)  Patient is on additional specialty medications: No  Patient is on more than two specialty medications: No  Any gaps in refill history greater than 2 weeks in the last 3 months: no  Demonstrates understanding of importance of adherence: yes  Informant: patient  Reliability of informant: reliable  Provider-estimated medication adherence level: good  Patient is at risk for Non-Adherence: No  Reasons for non-adherence: no problems identified                                Were doses missed due to medication being on hold? No    REPATHA SURECLICK 140 mg/mL Pnij (evolocumab)  : 0 days of medicine on hand       REFERRAL TO PHARMACIST     Referral to the pharmacist: Not needed      [facility]     Shipping address confirmed in Epic.     Delivery Scheduled: Yes, Expected medication delivery date: 09/01/22.     Medication will be delivered via Same Day Courier to the prescription address in Epic WAM.    Shayanne Gomm' W Wilhemena Durie Shared [facility] Specialty Technician

## 2022-09-01 MED FILL — REPATHA SURECLICK 140 MG/ML SUBCUTANEOUS PEN INJECTOR: SUBCUTANEOUS | 28 days supply | Qty: 2 | Fill #1

## 2022-09-03 NOTE — Unmapped (Signed)
A1c Outreach complete

## 2022-09-10 ENCOUNTER — Ambulatory Visit: Admit: 2022-09-10 | Discharge: 2022-09-11 | Payer: PRIVATE HEALTH INSURANCE

## 2022-09-10 DIAGNOSIS — Z862 Personal history of diseases of the blood and blood-forming organs and certain disorders involving the immune mechanism: Principal | ICD-10-CM

## 2022-09-10 DIAGNOSIS — N390 Urinary tract infection, site not specified: Principal | ICD-10-CM

## 2022-09-10 DIAGNOSIS — C50312 Malignant neoplasm of lower-inner quadrant of left female breast: Principal | ICD-10-CM

## 2022-09-10 DIAGNOSIS — J849 Interstitial pulmonary disease, unspecified: Principal | ICD-10-CM

## 2022-09-10 DIAGNOSIS — E039 Hypothyroidism, unspecified: Principal | ICD-10-CM

## 2022-09-10 DIAGNOSIS — F32A Depression, unspecified depression type: Principal | ICD-10-CM

## 2022-09-10 DIAGNOSIS — R918 Other nonspecific abnormal finding of lung field: Principal | ICD-10-CM

## 2022-09-10 DIAGNOSIS — E782 Mixed hyperlipidemia: Principal | ICD-10-CM

## 2022-09-10 DIAGNOSIS — Z170 Estrogen receptor positive status [ER+]: Principal | ICD-10-CM

## 2022-09-10 DIAGNOSIS — J383 Other diseases of vocal cords: Principal | ICD-10-CM

## 2022-09-10 DIAGNOSIS — S0285XS Fracture of orbit, unspecified, sequela: Principal | ICD-10-CM

## 2022-09-10 DIAGNOSIS — G4700 Insomnia, unspecified: Principal | ICD-10-CM

## 2022-09-10 DIAGNOSIS — E118 Type 2 diabetes mellitus with unspecified complications: Principal | ICD-10-CM

## 2022-09-10 DIAGNOSIS — I2510 Atherosclerotic heart disease of native coronary artery without angina pectoris: Principal | ICD-10-CM

## 2022-09-10 DIAGNOSIS — I10 Essential (primary) hypertension: Principal | ICD-10-CM

## 2022-09-10 LAB — LIPID PANEL
CHOLESTEROL/HDL RATIO SCREEN: 2.8 (ref 1.0–4.5)
CHOLESTEROL: 166 mg/dL (ref <=200)
HDL CHOLESTEROL: 59 mg/dL (ref 40–60)
LDL CHOLESTEROL CALCULATED: 74 mg/dL (ref 40–99)
NON-HDL CHOLESTEROL: 107 mg/dL (ref 70–130)
TRIGLYCERIDES: 166 mg/dL — ABNORMAL HIGH (ref 0–150)
VLDL CHOLESTEROL CAL: 33.2 mg/dL (ref 11–41)

## 2022-09-10 LAB — COMPREHENSIVE METABOLIC PANEL
ALBUMIN: 4.2 g/dL (ref 3.4–5.0)
ALKALINE PHOSPHATASE: 74 U/L (ref 46–116)
ALT (SGPT): 15 U/L (ref 10–49)
ANION GAP: 8 mmol/L (ref 5–14)
AST (SGOT): 23 U/L (ref <=34)
BILIRUBIN TOTAL: 0.3 mg/dL (ref 0.3–1.2)
BLOOD UREA NITROGEN: 23 mg/dL (ref 9–23)
BUN / CREAT RATIO: 24
CALCIUM: 9.6 mg/dL (ref 8.7–10.4)
CHLORIDE: 104 mmol/L (ref 98–107)
CO2: 28 mmol/L (ref 20.0–31.0)
CREATININE: 0.96 mg/dL
EGFR CKD-EPI (2021) FEMALE: 63 mL/min/{1.73_m2} (ref >=60)
GLUCOSE RANDOM: 101 mg/dL — ABNORMAL HIGH (ref 70–99)
POTASSIUM: 4.1 mmol/L (ref 3.4–4.8)
PROTEIN TOTAL: 8 g/dL (ref 5.7–8.2)
SODIUM: 140 mmol/L (ref 135–145)

## 2022-09-10 MED ORDER — TRAZODONE 50 MG TABLET
ORAL_TABLET | 11 refills | 0 days | Status: CP
Start: 2022-09-10 — End: ?

## 2022-09-10 NOTE — Unmapped (Signed)
Call [facility] to schedule annual visit due this month

## 2022-09-10 NOTE — Unmapped (Signed)
Assessment/Plan:    Amber Duncan was seen today for follow-up.    Diagnoses and all orders for this visit:    Insomnia, unspecified type  -     traZODone (DESYREL) 50 MG tablet; Take 1/2 to 1 tab at bedtime as directed    Closed fracture of orbital wall, sequela (CMS-HCC)    Hypothyroidism, unspecified type    Hx of iron deficiency anemia    Essential hypertension  -     Comprehensive Metabolic Panel  -     Lipid Panel    Chronic UTI    Dysphonia spastica    Mixed hyperlipidemia  -     Comprehensive Metabolic Panel  -     Lipid Panel    Malignant neoplasm of lower-inner quadrant of left breast in female, estrogen receptor positive (CMS-HCC)    Depression, unspecified depression type    Lung nodules    Type 2 diabetes with complication (CMS-HCC)  -     POCT glycosylated hemoglobin (Hb A1C)  -     Comprehensive Metabolic Panel  -     Lipid Panel    ILD (interstitial lung disease) (CMS-HCC)    Coronary artery disease involving native coronary artery of native heart without angina pectoris  -     Comprehensive Metabolic Panel  -     Lipid Panel      -Diabetes mellitus with complications  CMP done in 10/2021 revealed normal serum electrolytes, creatinine 1.0, glucose of 141 and normal LFTs.   DM has been well controlled. Continue metformin 1000 mg BID and diabetic diet.  Repeat HgA1c in May/2023 was 5.4.  She is due for repeat A1c today. It is 6.1 today.     -CAD/dyslipidemia: she is followed by [facility] cardiology and last seen in 09/2021 and encounter details reviewed with the pt today.    She continues on Repatha and low dose pravastatin as previously directed.  Her LDL cholesterol was 67 in 16/1096 and LFT's were normal in 10/2021. She will see cardiology in 1 yr./ due 1/204 She is due for annual CMP and  lipid panel today.      -left sided Breast cancer history: infiltrating ductal carcinoma, estrogen receptor positive, progesterone receptor positive and HER2 negative s/p lumpectomy in 01/2020 and follow up radiation Rx completed in 02/2020.   She is   Followed annually by breast oncology, seen early this year. She had a normal  mammogram and breast US in 09/2021. She will follow up with oncology in 1 yrs time.  She has a mammogram and oncology visit scheduled for this month.     -Dysuria with history of chronic UTIs and microscopic hematuria   She was referred to [facility] urology and she was seen in  04/2021 and advised to follow up in 3 months time/she has not followed up as recommended.  She was started on macrodantin 100 mg daily for UTI prevention, but stopped due to side effects. She had cystoscopy last year which was normal. She was to start estrace vaginal cream twice a week and Keflex 250 mg 1 tablet daily for UTI prevention.  She continues daily D Mannose and cranberry tablet, regimen which has helped as well.  Patient wishes to defer urology follow-up since she does not have UTI recurrence if she remains on this current regimen.     -History of anemia - chronic iron def anemia  The pt is followed by [facility] hematology and last visit was in 01/2022 and  encounter details  reviewed with the pt today. She has a history of AVM's with intermittent bleeding.  She is now followed by [facility] GI and seen in June/2023.  She has had 2  IV iron infusions since 01/2022 hematology visit. ( a total of 4 infusions in 2022).  CBC done in 09/2021 revealed stable H/H of 12/35 with MCV of 100.  Normal white blood cell and platelet count and normal iron and ferritin levels.   EGD and colonoscopy were done in 2021 in the setting of acute GI bleed.  Bleeding was attributed to blood thinner and AVMs.  She is on low dose daily enteric aspirin instead, as per cardiology recommendation.   She will follow up with hematology in 01/2023.      - chronic fatigue symptoms: possibly attributed to chronic anemia.  She is followed by hematology.  Vitamin D level was normal in August/2023.  TSH was normal in May/2023.    - BMI of 30: pt advised on caloric restriction and exercise to promote weight gain.        -History of AVM related GI bleeding: First noted in 2018.  Patient was seen by GI in June/2023 and encounter details reviewed with the patient today.  She was advised that the patient continue iron infusion as recommended by hematology she will continue on daily p.o. iron supplement as scheduled.  Her BM's are soft and mushy and she described this to her GI provider.   She had colonoscopy done in 2021 with recommendation for repeat in 10 yrs.  She may start OTC metamucil as a stool binder and if no relief consider trial of Questran.       -Hypothyroidism: pt continues on levothyroxine supplementation as directed. Her TSH was normal in 01/2022.    -Hypertension:  Patient remains compliant with her current medication regimen.  She has been counseled on maintaining low-salt and low-fat dietary intake and exercise routine.    CMP done 10/2021 revealed creatinine level of 1 with normal serum electrolytes and LFT's.  Pt states her BP was very high at her last cardiology visit and lisinopril was stopped and amlodipine 5 mg daily.  Her home BP readings have normalized on this medication.  She is due for CMP today.     - Cervical level spinal myoclonus, Dysphonia spastica and migraine headache history:  She is followed by [facility] neurology and last seen in 06/2022 and encounter details reviewed with the pt today.  Was advised to continue with her current medication regimen.  Dysphonia may be secondary to low iron levels.  She was advised to follow-up with neurology in 6 months time.     - history of fall in 06/2022 with resulting facial injury: pt was seen at [facility] ED and diagnosed with blowout fracture of right orbital wall and right retinal tear   She is followed by facial bone specialist and opth.  Latter follow up is scheduled in the near future.    -  depression: Pt on  Prozac recently increased to 20 mg daily on her last visit in August/2023.  She struggles with insomnia. She falls asleep with no issus but then wakes up and can't fall back to sleep. I recommend a trial trazodone 50 mg as directed.     - history of COPD/ILD and pulmonary nodules: pt is part of a lung cancer screening study and she had a recent surveillance chest CT recently which revealed increase in lung nodule size and she is referred to [facility] pulmonary  clinic for further evaluation.  She was seen by pulmonary in July/2023.  She had PFTs done in December/2023.  Noncontrast follow-up chest CT was done in November/2023 and revealed unchanged pulmonary nodule appearance and smoking related lung disease unchanged from prior imaging.  Pulmonary rehab at [facility] was discussed but this is not likely covered by her insurance. She will follow up with [facility] pulmonary clinic in 12/2022 and repeat PFT's are planned. She continues with home respiratory exercises.      I personally spent 40 minutes, face to face, and non face to face, in the care of this patient, which includes pre , intra and post visit time on date of service.  All documented time is specific to EM and dose not include any procedures performed today.        Return in about 4 months (around 01/09/2023) for Recheck.    Subjective:     HPI  The patient is seen today for routine follow-up visit.  I last saw the patient in August/2023 with the following medical conditions:      -Diabetes mellitus with complications  CMP done in 10/2021 revealed normal serum electrolytes, creatinine 1.0, glucose of 141 and normal LFTs.   DM has been well controlled. Continue metformin 1000 mg BID and diabetic diet.  Repeat HgA1c in May/2023 was 5.4.  She is due for repeat A1c today. It is 6.1 today.     -CAD/dyslipidemia: she is followed by [facility] cardiology and last seen in 09/2021 and encounter details reviewed with the pt today.    She continues on Repatha and low dose pravastatin as previously directed.  Her LDL cholesterol was 67 in 75/6433 and LFT's were normal in 10/2021. She will see cardiology in 1 yr./ due 1/204 She is due for annual CMP and  lipid panel today.      -left sided Breast cancer history: infiltrating ductal carcinoma, estrogen receptor positive, progesterone receptor positive and HER2 negative s/p lumpectomy in 01/2020 and follow up radiation Rx completed in 02/2020.   She is   Followed annually by breast oncology, seen early this year. She had a normal  mammogram and breast US in 09/2021. She will follow up with oncology in 1 yrs time.  She has a mammogram and oncology visit scheduled for this month.     -Dysuria with history of chronic UTIs and microscopic hematuria   She was referred to [facility] urology and she was seen in  04/2021 and advised to follow up in 3 months time/she has not followed up as recommended.  She was started on macrodantin 100 mg daily for UTI prevention, but stopped due to side effects. She had cystoscopy last year which was normal. She was to start estrace vaginal cream twice a week and Keflex 250 mg 1 tablet daily for UTI prevention.  She continues daily D Mannose and cranberry tablet, regimen which has helped as well.  Patient wishes to defer urology follow-up since she does not have UTI recurrence if she remains on this current regimen.     -History of anemia - chronic iron def anemia  The pt is followed by [facility] hematology and last visit was in 01/2022 and  encounter details reviewed with the pt today. She has a history of AVM's with intermittent bleeding.  She is now followed by [facility] GI and seen in June/2023.  She has had 2  IV iron infusions since 01/2022 hematology visit. ( a total of 4 infusions in 2022).  CBC done  in 09/2021 revealed stable H/H of 12/35 with MCV of 100.  Normal white blood cell and platelet count and normal iron and ferritin levels.   EGD and colonoscopy were done in 2021 in the setting of acute GI bleed.  Bleeding was attributed to blood thinner and AVMs.  She is on low dose daily enteric aspirin instead, as per cardiology recommendation.   She will follow up with hematology in 01/2023.      - chronic fatigue symptoms: possibly attributed to chronic anemia.  She is followed by hematology.  Vitamin D level was normal in August/2023.  TSH was normal in May/2023.    - BMI of 30: pt advised on caloric restriction and exercise to promote weight gain.        -History of AVM related GI bleeding: First noted in 2018.  Patient was seen by GI in June/2023 and encounter details reviewed with the patient today.  She was advised that the patient continue iron infusion as recommended by hematology she will continue on daily p.o. iron supplement as scheduled.  Her BM's are soft and mushy and she described this to her GI provider.   She had colonoscopy done in 2021 with recommendation for repeat in 10 yrs.  She may start OTC metamucil as a stool binder and if no relief consider trial of Questran.       -Hypothyroidism: pt continues on levothyroxine supplementation as directed. Her TSH was normal in 01/2022.    -Hypertension:  Patient remains compliant with her current medication regimen.  She has been counseled on maintaining low-salt and low-fat dietary intake and exercise routine.    CMP done 10/2021 revealed creatinine level of 1 with normal serum electrolytes and LFT's.  Pt states her BP was very high at her last cardiology visit and lisinopril was stopped and amlodipine 5 mg daily.  Her home BP readings have normalized on this medication.  She is due for CMP today.     - Cervical level spinal myoclonus, Dysphonia spastica and migraine headache history:  She is followed by [facility] neurology and last seen in 06/2022 and encounter details reviewed with the pt today.  Was advised to continue with her current medication regimen.  Dysphonia may be secondary to low iron levels.  She was advised to follow-up with neurology in 6 months time.     - history of fall in 06/2022 with resulting facial injury: pt was seen at [facility] ED and diagnosed with blowout fracture of right orbital wall and right retinal tear   She is followed by facial bone specialist and opth.  Latter follow up is scheduled in the near future.    -  depression: Pt on  Prozac recently increased to 20 mg daily on her last visit in August/2023.  She struggles with insomnia. She falls asleep with no issus but then wakes up and can't fall back to sleep. I recommend a trial trazodone 50 mg as directed.     - history of COPD/ILD and pulmonary nodules: pt is part of a lung cancer screening study and she had a recent surveillance chest CT recently which revealed increase in lung nodule size and she is referred to [facility] pulmonary clinic for further evaluation.  She was seen by pulmonary in July/2023.  She had PFTs done in December/2023.  Noncontrast follow-up chest CT was done in November/2023 and revealed unchanged pulmonary nodule appearance and smoking related lung disease unchanged from prior imaging.  Pulmonary rehab at [facility] was discussed  but this is not likely covered by her insurance. She will follow up with [facility] pulmonary clinic in 12/2022 and repeat PFT's are planned. She continues with home respiratory exercises.          ROS  Constitutional:  Denies  unexpected weight loss or gain, or weakness   Eyes:  Denies visual changes  Respiratory:  Denies cough or shortness of breath. No change in exercise  tolerance  Cardiovascular:  Denies chest pain, palpitations or lower extremity swelling   GI:  Denies abdominal pain, diarrhea, constipation   Musculoskeletal:  Denies myalgias  Skin:  Denies nonhealing lesions  Neurologic:  Denies headache, focal weakness or numbness, tingling  Endocrine:  Denies polyuria or polydypsia   Psychiatric:  Denies depression, anxiety      Outpatient Medications Prior to Visit   Medication Sig Dispense Refill    amlodipine (NORVASC) 5 MG tablet Take 1 tablet (5 mg total) by mouth daily. 30 tablet 6    aspirin-calcium carbonate 81 mg-300 mg calcium(777 mg) Tab Take 81 mg by mouth.      cephalexin (KEFLEX) 250 MG capsule Take 1 capsule daily for UTI prevention 30 capsule 11    clonazePAM (KLONOPIN) 0.5 MG tablet Take 3 tabs per day as directed 90 tablet 2    docusate sodium (COLACE) 100 MG capsule Take 1 capsule (100 mg total) by mouth daily as needed.      evolocumab (REPATHA SURECLICK) 140 mg/mL PnIj Inject the contents of 1 pen (140 mg) under the skin every fourteen (14) days. 2 mL 11    ferrous gluconate 324 MG tablet Take 1 tablet (324 mg total) by mouth once.      FLUoxetine (PROZAC) 20 MG capsule Take 1 capsule (20 mg total) by mouth daily. 90 capsule 3    levETIRAcetam (KEPPRA) 1000 MG tablet TAKE 1 TABLET BY MOUTH 2 TIMES DAILY 180 tablet 3    levETIRAcetam (KEPPRA) 250 MG tablet Take 1 tablet (250 mg total) by mouth two (2) times a day. 180 tablet 3    metFORMIN (GLUCOPHAGE) 500 MG tablet Take 2 tablets (1,000 mg total) by mouth in the morning and 2 tablets (1,000 mg total) in the evening. Take with meals. 360 tablet 1    omeprazole 20 mg TbLD Take by mouth nightly as needed.      pravastatin (PRAVACHOL) 10 MG tablet TAKE 0.5 TABLET BY MOUTH DAILY. 45 tablet 2    SYNTHROID 100 mcg tablet Take 1 tablet (100 mcg total) by mouth daily. 90 tablet 3    empty container (SHARPS-A-GATOR DISPOSAL SYSTEM) Misc Use as directed for sharps disposal 1 each 2    lisinopriL (PRINIVIL,ZESTRIL) 20 MG tablet Take 1 tablet (20 mg total) by mouth daily. (Patient not taking: Reported on 09/10/2022) 90 tablet 3     No facility-administered medications prior to visit.         Objective:       Vital Signs  BP 130/74  - Pulse 77  - Temp 36.4 ??C (97.5 ??F)  - Ht 167.6 cm (5' 6)  - Wt 86.6 kg (191 lb)  - SpO2 96%  - BMI 30.83 kg/m??      Exam  General: normal appearance  EYES: Anicteric sclerae.  ENT: Oropharynx moist.  RESP: Relaxed respiratory effort. Clear to auscultation without wheezes or crackles.   CV: Regular rate and rhythm. Normal S1 and S2. No murmurs or gallops.  No lower extremity edema. Posterior tibial pulses are 2+ and  symmetric.  abd exam: non tender, no masses, no HSM   MSK: No focal muscle tenderness.  SKIN: Appropriately warm and moist.  NEURO: Stable gait and coordination.    Allergies:     Patient has no known allergies.    Current Medications:     Current Outpatient Medications   Medication Sig Dispense Refill    amlodipine (NORVASC) 5 MG tablet Take 1 tablet (5 mg total) by mouth daily. 30 tablet 6    aspirin-calcium carbonate 81 mg-300 mg calcium(777 mg) Tab Take 81 mg by mouth.      cephalexin (KEFLEX) 250 MG capsule Take 1 capsule daily for UTI prevention 30 capsule 11    clonazePAM (KLONOPIN) 0.5 MG tablet Take 3 tabs per day as directed 90 tablet 2    docusate sodium (COLACE) 100 MG capsule Take 1 capsule (100 mg total) by mouth daily as needed.      evolocumab (REPATHA SURECLICK) 140 mg/mL PnIj Inject the contents of 1 pen (140 mg) under the skin every fourteen (14) days. 2 mL 11    ferrous gluconate 324 MG tablet Take 1 tablet (324 mg total) by mouth once.      FLUoxetine (PROZAC) 20 MG capsule Take 1 capsule (20 mg total) by mouth daily. 90 capsule 3    levETIRAcetam (KEPPRA) 1000 MG tablet TAKE 1 TABLET BY MOUTH 2 TIMES DAILY 180 tablet 3    levETIRAcetam (KEPPRA) 250 MG tablet Take 1 tablet (250 mg total) by mouth two (2) times a day. 180 tablet 3    metFORMIN (GLUCOPHAGE) 500 MG tablet Take 2 tablets (1,000 mg total) by mouth in the morning and 2 tablets (1,000 mg total) in the evening. Take with meals. 360 tablet 1    omeprazole 20 mg TbLD Take by mouth nightly as needed.      pravastatin (PRAVACHOL) 10 MG tablet TAKE 0.5 TABLET BY MOUTH DAILY. 45 tablet 2    SYNTHROID 100 mcg tablet Take 1 tablet (100 mcg total) by mouth daily. 90 tablet 3    empty container (SHARPS-A-GATOR DISPOSAL SYSTEM) Misc Use as directed for sharps disposal 1 each 2    traZODone (DESYREL) 50 MG tablet Take 1/2 to 1 tab at bedtime as directed 30 tablet 11     No current facility-administered medications for this visit.           Note - This record has been created using [employer]. Chart creation errors have been sought, but may not always have been located. Such creation errors do not reflect on the standard of medical care.    [name], MD

## 2022-09-11 NOTE — Unmapped (Signed)
Essentially normal CMP lipid panel.

## 2022-09-25 DIAGNOSIS — G4733 Obstructive sleep apnea (adult) (pediatric): Principal | ICD-10-CM

## 2022-09-28 DIAGNOSIS — F32A Depression, unspecified depression type: Principal | ICD-10-CM

## 2022-09-28 MED ORDER — FLUOXETINE 20 MG CAPSULE
ORAL_CAPSULE | 3 refills | 0 days
Start: 2022-09-28 — End: ?

## 2022-09-29 DIAGNOSIS — E782 Mixed hyperlipidemia: Principal | ICD-10-CM

## 2022-09-29 MED ORDER — PRAVASTATIN 10 MG TABLET
ORAL_TABLET | 1 refills | 0 days | Status: CP
Start: 2022-09-29 — End: ?

## 2022-09-29 NOTE — Unmapped (Signed)
Refill request received for patient.      Medication Requested: pravastatin  Last Office Visit: Visit date not found   Next Office Visit: 11/24/2022  Last Prescriber: Ramond Marrow    Nurse refill requirements met? Yes  If not met, why:     Sent to: Pharmacy per protocol  If sent to provider, which provider?:

## 2022-09-30 ENCOUNTER — Ambulatory Visit
Admit: 2022-09-30 | Discharge: 2022-10-01 | Payer: PRIVATE HEALTH INSURANCE | Attending: Physician Assistant | Primary: Physician Assistant

## 2022-09-30 DIAGNOSIS — F32A Depression, unspecified depression type: Principal | ICD-10-CM

## 2022-09-30 DIAGNOSIS — R351 Nocturia: Principal | ICD-10-CM

## 2022-09-30 DIAGNOSIS — R5382 Chronic fatigue, unspecified: Principal | ICD-10-CM

## 2022-09-30 DIAGNOSIS — J849 Interstitial pulmonary disease, unspecified: Principal | ICD-10-CM

## 2022-09-30 DIAGNOSIS — R0600 Dyspnea, unspecified: Principal | ICD-10-CM

## 2022-09-30 DIAGNOSIS — G4700 Insomnia, unspecified: Principal | ICD-10-CM

## 2022-09-30 DIAGNOSIS — R066 Hiccough: Principal | ICD-10-CM

## 2022-09-30 DIAGNOSIS — R499 Unspecified voice and resonance disorder: Principal | ICD-10-CM

## 2022-09-30 DIAGNOSIS — G478 Other sleep disorders: Principal | ICD-10-CM

## 2022-09-30 DIAGNOSIS — G4733 Obstructive sleep apnea (adult) (pediatric): Principal | ICD-10-CM

## 2022-09-30 DIAGNOSIS — R918 Other nonspecific abnormal finding of lung field: Principal | ICD-10-CM

## 2022-09-30 DIAGNOSIS — G253 Myoclonus: Principal | ICD-10-CM

## 2022-09-30 DIAGNOSIS — M5481 Occipital neuralgia: Principal | ICD-10-CM

## 2022-09-30 DIAGNOSIS — I10 Essential (primary) hypertension: Principal | ICD-10-CM

## 2022-09-30 DIAGNOSIS — S0450XS Injury of facial nerve, unspecified side, sequela: Principal | ICD-10-CM

## 2022-09-30 DIAGNOSIS — J383 Other diseases of vocal cords: Principal | ICD-10-CM

## 2022-09-30 DIAGNOSIS — S0285XS Fracture of orbit, unspecified, sequela: Principal | ICD-10-CM

## 2022-09-30 DIAGNOSIS — E669 Obesity, unspecified: Principal | ICD-10-CM

## 2022-09-30 DIAGNOSIS — R519 Sleep related headaches: Principal | ICD-10-CM

## 2022-09-30 DIAGNOSIS — Z6830 Body mass index (BMI) 30.0-30.9, adult: Principal | ICD-10-CM

## 2022-09-30 DIAGNOSIS — I2510 Atherosclerotic heart disease of native coronary artery without angina pectoris: Principal | ICD-10-CM

## 2022-09-30 NOTE — Unmapped (Signed)
You are not to drive when sleepy or drowsy as this may be dangerous to yourself and others.  It is also illegal !!    Sleep:  You can continue taking your trazodone  Avoid food/fluids within 3 hours of your bedtime  Change the timing of your fluoxetine to the morning time.  I would start by taking your next dose at 3 PM  Avoid visual electronics within 1 hour of your bedtime  Avoid naps and situations that you may doze off in  Limit your caffeine to the morning only.  This includes decaf    Sleep apnea:  You have sleep apnea but this needs to be reevaluated with a sleep study.  I have ordered this to be completed at [facility].  Please give them a call to get scheduled for your study.  Generally, I will discuss your results with you a couple of weeks after the study has been completed.  The sleep lab is unable to provide you with sleep study results.Remember that sleep apnea is important to diagnose and treat as it is an independent risk factor for cardiovascular disease, including but not limited to hypertension, difficult to treat hypertension, arrythmias, congestive heart failure, heart attacks, stroke and increased mortality.     [place] [facility]    [phone]        [facility] patient line:    [phone]       UpToDate:    Patient education: Sleep apnea in adults (Beyond the Basics)    Author:  Truitt Leep, MD  Section Editor:  Eliezer Lofts, MD  Deputy Editor:  Gwen Her, MD    All topics are updated as new evidence becomes available and our peer review process is complete.  Literature review current through: Mar 2023. - This topic last updated: Mar 14, 2020.  Please read the Disclaimer at the end of this page.    INTRODUCTION  Normally during sleep, air moves in and out through the nose, throat, and lungs at a regular rhythm. In a person with sleep apnea, air movement is periodically reduced or stopped. There are two types of sleep apnea, obstructive sleep apnea and central (non-obstructive) sleep apnea; some forms of apnea involve both types (a ???mixed??? apnea). For both types of sleep apnea, there is a reduction in breathing. In obstructive sleep apnea, breathing becomes abnormal because of narrowing or closure of the throat. In central sleep apnea, breathing is abnormal because of a change in the breathing control and rhythm, but the throat remains open.  Sleep apnea is a serious condition that can affect sleep satisfaction and quality, alertness and efficiency while awake, and the ability to safely drive a motor vehicle; it can also impact long term health. Approximately 25 percent of adults are at risk for sleep apnea of some degree [1]. Males are more commonly affected than females, but after menopause it is more equal. Other risk factors include middle and older age, being overweight or obese, and having a small mouth and throat.  This topic review focuses on the most common type of sleep apnea in adults, obstructive sleep apnea (OSA).    HOW SLEEP APNEA OCCURS  The throat is surrounded by muscles that control the airway for speaking, swallowing, and breathing. These muscles hang from the skull and jaw and surround a flexible tube (the main airway that brings air to the lungs). During sleep, muscles are less active, which can cause the throat to narrow (  figure 1). In most people, this narrowing does not affect breathing. In others, it can cause snoring, sometimes with reduced or completely blocked airflow (figure 2). A completely blocked airway without airflow is called an obstructive apnea. Partial obstruction with diminished airflow is called a hypopnea. A person may have both sleep apnea and hypopnea.  Insufficient breathing due to apnea or hypopnea causes oxygen levels to fall and carbon dioxide levels to rise. Because the airway is blocked, breathing faster or harder does not help to improve oxygen levels until the airway is reopened. Typically, the obstruction requires the person to briefly awaken to activate upper airway muscles. Once the airway is opened, the person then takes several deep breaths to catch up on breathing. As the person awakens, he or she may move briefly, snort, or loudly snore. Less frequently, a person may awaken completely with a sensation of gasping, smothering, or choking. If the person falls back to sleep quickly, he or she will not remember the event. Many people with sleep apnea are unaware of their abnormal breathing in sleep, and all patients underestimate how often their sleep is interrupted. Awakening from sleep causes sleep to be unrefreshing and causes a sense of fatigue and wake time sleepiness.    Anatomic causes of obstructive sleep apnea -- Some patients have OSA because of a small upper airway. As the bones of the face and skull develop, some people develop a small lower face, a small mouth, and a tongue that seems too large for the mouth. These features are largely genetically determined, which explains why OSA tends to cluster in some families. Obesity also increases the risk of airway closure. Tonsil enlargement can be an important cause, especially in children. While these factors increase the risk of sleep apnea, they are not likely to cause noticeable symptoms or problems while the person is awake.    SLEEP APNEA SYMPTOMS  The major symptoms of OSA are loud snoring, fatigue, and feeling sleepy during the day (or whenever the person is normally awake). However, some people have no symptoms. For example, if the person does not have a bed partner, they may not be aware of the snoring. Fatigue and sleepiness have many causes and are often attributed to overwork and increasing age. As a result, it may take time for a person to recognize that they have a problem. A bed partner or spouse often prompts the person to seek medical attention (eg, for pauses, snorts, and snoring during sleep).  Other symptoms may include one or more of the following:  ?Restless sleep  ?Awakening with choking, gasping, or smothering  ?Morning headaches, dry mouth, or sore throat  ?Waking frequently to urinate  ?Awakening unrested, groggy  ?Low energy, difficulty concentrating, memory impairment  Risk factors -- Certain factors increase the risk of sleep apnea.  ?Increasing age - OSA occurs at all ages, but it is more common in middle and older age adults.  ?Female sex and hormones - OSA is twice as common in males as females, especially in middle aged males and in those on replacement hormones.  ?Obesity - The more obese a person is, the more likely he or she is to have OSA.  ?Sedation from medication or alcohol - These reduce breathing and prevent awakening during sleep, and can lengthen periods of apnea (no breathing), with potentially dangerous consequences.  ?Abnormality of the airway that narrows it (eg, large tonsils).    SLEEP APNEA HEALTH CONSEQUENCES  Complications of sleep apnea can include  reduced alertness, difficulty concentrating, and sleepiness. The consequence is an increased risk of crashes, accidents, and errors. Studies have shown that people with severe OSA are more than twice as likely to be involved in a motor vehicle accident as people without sleep apnea. People with OSA are encouraged to recognize this risk and discuss options for driving, working, and performing other high-risk tasks with a healthcare provider.  In addition, people with untreated OSA may have an increased risk or worsened control of cardiovascular problems such as high blood pressure, heart attack, abnormal heart rhythms, or stroke [2]. This risk may be due to changes in the heart rate and blood pressure that occur during sleep.    SLEEP APNEA DIAGNOSIS  The diagnosis of OSA and a plan to manage it is best made by a knowledgeable sleep medicine specialist who has an understanding of the individual's health issues. The diagnosis is usually based upon the person's medical history, physical examination, and testing, including:  ?A complaint of snoring and ineffective sleep  ?Neck size (greater than 17 inches in men or 16 inches in women) is associated with an increased risk of sleep apnea  ?A small upper airway: difficulty seeing the throat because of a tongue that is large for the mouth  ?High blood pressure, especially if it is resistant to treatment  ?If a bed partner has observed the patient during episodes of stopped breathing (apnea), choking, or gasping during sleep, there is a strong possibility of sleep apnea    An overnight sleep study is called a polysomnogram. The polysomnogram measures the breathing effort and airflow, blood oxygen level, heart rate and rhythm, duration of the various stages of sleep, body position, and movement of the arms/legs.  At-home devices are available that monitor breathing, oxygen saturation, position, and heart rate, but not sleep itself. Home monitoring is a reasonable alternative to conventional testing in a sleep laboratory if the clinician strongly suspects moderate or severe sleep apnea and the patient does not have other illnesses or sleep disorders that may interfere with interpretation of the results.    SLEEP APNEA TREATMENT  The goal of treatment is to maintain an open airway during sleep. Effective treatment will eliminate the symptoms of sleep disturbance; long-term health consequences are also reduced. Most treatments require nightly use. The challenge for the clinician and the patient is to select an effective therapy that is appropriate for the patient's problem and that is acceptable for long term use.  Continuous positive airway pressure (CPAP) -- The most effective predictable, and commonly used treatment for sleep apnea uses air pressure from a mechanical device to keep the upper airway open during sleep. A CPAP device (figure 3) uses an air-tight attachment to the nose, typically a mask, connected to a tube and a blower which generates the pressure [3]. Devices should fit comfortably into the nasal opening, or over the nose or nose and mouth. CPAP should be used any time the person sleeps (day or night).  The CPAP device can be started in the sleep lab, where a technician can adjust the pressure and select the best equipment to keep the airway open. Alternatively, an auto device with a self-adjusting pressure feature, provided with proper education and training, can get treatment started without another sleep test. CPAP devices are now relatively quiet, and having a comfortable mask fit is key, but most people will accept the treatment if it improves their symptoms. However, difficulty with mask comfort and/or nasal congestion result in a  reduction of regular use to only 50 percent of people after two years.  Continued follow-up with a healthcare provider helps promote effective treatment as technology improves. Information from the CPAP machine is often used by you, physicians, therapists, and insurers to track the success of treatment. CPAP can be delivered with different features to improve comfort and solve problems that may come up during treatment. Changes in treatment may be needed if symptoms do not improve or if the person's condition changes, such as a gain or loss of weight.    Behavior and lifestyle changes -- Most people with OSA can benefit from certain behavior changes.  Changing sleep position -- Adjusting sleep position (to stay off the back) may help improve sleep quality in people who have OSA when sleeping on the back. However, this is difficult to maintain throughout the night and is rarely an adequate solution.    Weight loss -- Weight loss is very helpful for people who are obese or overweight. Weight loss through dietary changes, exercise, and/or surgical treatment is equally effective. However, it can be difficult to maintain weight loss; the five-year success of non-surgical weight loss is only 5 percent, meaning that 95 percent of people regain lost weight. (See Patient education: Losing weight (Beyond the Basics).)    Avoiding alcohol and other sedatives -- Alcohol can worsen sleepiness, increasing the risk of accidents or injury. People with OSA are often counseled to drink little to no alcohol, even during the daytime. Similarly, people who take anti-anxiety medications or sedatives to sleep should speak with their healthcare provider about the impact of these medications on sleep apnea.  If you have OSA, you will need to notify other healthcare providers, including surgeons, about your condition and the potential risks of being sedated. People with OSA who are given perioperative anesthesia and/or pain medications require special management and close monitoring to reduce the risk of a blocked airway.    Other treatments -- While behavioral changes and CPAP are typically recommended as initial therapy for people with OSA, other treatments may be used in some situations.    Appliances -- An oral appliance (or mandibular advancement device) can reposition the jaw, bringing the tongue and soft palate forward to relieve obstruction in some people [4].  Oral appliances work very well to reduce snoring, although the effect on OSA is sometimes more limited [4]. As a result, they are best used for mild cases of OSA when relief of snoring is the main goal. While oral appliances are not as effective as CPAP for OSA, they may be an alternative for people who cannot tolerate (or choose not to have) CPAP. Side effects of oral appliances are generally minor but may include changes to the bite with prolonged use.    Other devices that aim to reduce snoring and improve sleep are also available over the counter or by prescription. These include strips that are placed over the nose or nostrils with the goal of helping keep the airway open. While some people find these devices helpful, there is limited evidence for their efficacy in treating sleep apnea. If you are interested in trying one of these devices, be sure to read all labels and instructions carefully, and discuss it your health care provider first.    Upper airway surgery -- Surgery is an alternative for patients who cannot tolerate or do not improve with nonsurgical treatments. Surgery can also be used in combination with other nonsurgical treatments.  Surgical procedures reshape structures in  the upper airways or surgically reposition bone or soft tissue. Uvulopalatopharyngoplasty (UPPP) removes the uvula and excessive tissue in the throat, including the tonsils, if present. Other procedures, such as maxillomandibular advancement (MMA), address both the upper and lower pharyngeal airway more globally.    UPPP alone has limited success rates (less than 50 percent) and people can relapse (when OSA symptoms return after surgery) [5]. As a result, this surgery is only recommended in a minority of people and should be considered with caution. MMA may have a higher success rate, particularly in people with abnormal jaw (maxilla and mandible) anatomy, but it is a complicated procedure.    Tracheostomy creates a permanent opening in the neck. It is reserved for people with severe disease in whom less drastic measures have failed or are inappropriate. Although successful in eliminating obstructive sleep apnea, tracheostomy requires significant lifestyle changes and carries some serious risks (eg, infection, bleeding, blockage).    There is increasing experience with a surgical approach that involves nerve stimulation to prevent the upper airway from closing during sleep. While there are specific criteria a person must meet in order to be a candidate for this procedure, it can sometimes help if other treatments have not been effective.  All surgical treatments require discussions about the goals of treatment, the expected outcomes, and potential complications.

## 2022-09-30 NOTE — Unmapped (Signed)
CLINIC NOTE   Sleep Clinic, Neurology  [facility]      Date of Service: 09/30/2022     Patient Name: Amber Duncan       MRN: [id number]       Date of Birth: 04/30/1951  Primary Care Physician: [name], MD  Referring Provider: Sharen Heck, MD    Assessment and Plan:   Impression:   Amber Duncan is a 72 y.o. female with history of breast CAD, CAD s/p CABG, DM, eye trauma HLD, HTN, migraines, chronic UTI, COPD, interstitial lung disease, hypothyroidism, fatigue, hepatitis, cervical level spinal myoclonus, OSA.    OSA: Amber Duncan was identified as having risk factors for sleep apnea, including loud snoring, waking up gasping for air, fragmented and nonrestorative sleep, and daytime sleepiness.  She was found to have sleep apnea via a PSG that was completed at [facility] in 2014.  Unfortunately, we do not have those records.  She reports compliant use and benefiting from PAP therapy with improved sleep quality and daytime energy.  Unfortunately, her CPAP device stopped working about four months ago.  As she is establishing care and is in need of a new CPAP machine, I spoke at length about central and obstructive sleep apnea pathophysiology.  I also discussed that untreated sleep apnea can contribute to poor sleep quality, mood, daytime energy, and poor quality of life.  It is also an independent risk factor for cardiovascular disease, including but not limited to hypertension, arrhythmias, MI, stroke, and increased mortality.  I informed her that she would have to have her sleep study repeated, though I am requesting for a split study.  While I would have her treated with PAP therapy, I also discussed alternative treatment options, including positional therapy, oral appliance, and surgical means.  All questions were answered.  Polysomnography ordered to be completed at [facility] with the request that her study be split     Insomnia: Amber Duncan is in bed for extended periods of time, sleeps during the day, watches television up until her bedtime, has fluids lights, and also has caffeine late.  In addition, she takes her fluoxetine at bedtime.  To improve things I did discuss with her the following:  You can continue taking your trazodone  Avoid food/fluids within 3 hours of your bedtime  Change the timing of your fluoxetine to the morning time.  I would start by taking your next dose at 3 PM  Avoid visual electronics within 1 hour of your bedtime  Avoid naps and situations that you may doze off in  Limit your caffeine to the morning only.  This includes decaf    I personally spent 60 minutes face-to-face and non-face-to-face in the care of this patient, which includes all pre, intra, and post visit time on the date of service.      Subjective:     Chief Complaint: Sleep apnea    History of Present Illness:     Amber Duncan is a 72 y.o. female seen for sleep apnea at the request of  Amber Iles Arnette Schaumann, MD for an opinion regarding potential etiologies, diagnostic work-up that should be considered, and/or recommendations for treatment options.      Chakara was referred to establish care for her sleep apnea.  She was originally found to be at risk for sleep apnea due to loud snoring, waking up gasping for air, nonrestorative sleep, and daytime somnolence.  She had a sleep study completed at [facility] in 2014 and was treated with CPAP.  She needs to remember where she received her CPAP device from or where she gets her supplies from.  She was compliant with her device until about four months ago, when her device would not power on.  She needs a new device and needs a local durable medical equipment company.  PAP therapy has been quite helpful for her with sleep and better daytime energy.  She uses nasal pillows and reports compliant use until her device gives out.  She does not currently have a sleep partner but knows that she snores as it sometimes wakes her up.  She typically sleeps on her side.  She will wake up coughing at times.  She occasionally wakes up with headaches.  She does not believe that she grinds her teeth or clenches her jaw.  She denies any sleep-related hallucinations, sleep paralysis, cataplexy, or sleepwalking.  She wakes up three times a night to urinate.  She frequently talks in her sleep.  She denies any dream enactment nor any RLS symptoms.    Jalon has a great deal of difficulty with sleep.  She takes trazodone as a sleep aid but feels that this has not been helpful.  She tends to watch television on the couch up until the time that she goes to bed.  Bedtime is between 8:30 PM and 9 PM, and it takes her a variable amount of time to fall asleep.  Sometimes, she quickly falls asleep, and other times, she is up as late as 4 AM.  She has multiple nighttime awakenings.  Sometimes, it is to urinate; other times, her talking that wakes her up.  She will also wake up gasping for air at times.  Her rise time varies as well.  If she has fallen asleep in a reasonable amount of time, she will wake up between  8:30 AM-9 AM.  If she falls asleep around 4 AM, she may not wake up until 1 PM.  She is quite sleepy during the day, especially if she is not doing anything.  If she is on the couch watching television or reading, she will fall asleep.  She has a cup of coffee every morning, and in the afternoon, she will have either decaffeinated coffee or decaffeinated tea, and this may be as late as 7 PM.  She denies any alcohol, tobacco products, over-the-counter sleep aids nor any illicit substances.      Review of Systems:  A 14-system review was completed and found to be negative except for what is mentioned in the HPI.     PMH:  Past Medical History:   Diagnosis Date    Breast cancer (CMS-HCC)     Chronic UTI     COPD (chronic obstructive pulmonary disease) (CMS-HCC)     Coronary artery disease     Diabetes (CMS-HCC)     Disease of thyroid gland     Eye trauma 06/17/2022 injury due to a fall on right eye    Fatigue     Hepatitis     Hepatitis     Hyperlipidemia     Hypertension     Migraine     Sleep apnea        Past Surgical Hx:  Past Surgical History:   Procedure Laterality Date    BACK SURGERY      BREAST BIOPSY Left 11/2019    positive    BREAST LUMPECTOMY Left 01/2020    CATARACT EXTRACTION  CHOLECYSTECTOMY      CORONARY STENT PLACEMENT      HYSTERECTOMY      KNEE SURGERY      PR COLONOSCOPY,ABLATE LESION N/A 01/21/2017    Procedure: COLONOSCOPY FLEX; W/ABLAT LES NOT AMENABLE-SNARE;  Surgeon: Luanne Bras, MD;  Location: [facility];  Service: Gastroenterology    PR COLSC FLX W/RMVL OF TUMOR POLYP LESION SNARE TQ N/A 01/21/2017    Procedure: COLONOSCOPY FLEX; W/REMOV TUMOR/LES BY SNARE;  Surgeon: Luanne Bras, MD;  Location: [facility];  Service: Gastroenterology    PR MASTECTOMY, PARTIAL Left 01/19/2020    Procedure: MASTECTOMY, PARTIAL (EG, LUMPECTOMY, TYLECTOMY, QUADRANTECTOMY, SEGMENTECTOMY);  Surgeon: Talbert Cage, DO;  Location: MAIN OR [facility];  Service: Surgical Oncology Breast    PR UPPER GI ENDOSCOPY,BIOPSY N/A 04/29/2018    Procedure: UGI ENDOSCOPY; WITH BIOPSY, SINGLE OR MULTIPLE;  Surgeon: Rona Ravens, MD;  [facility];  Service: Gastroenterology    RADIATION Left     REDUCTION MAMMAPLASTY Bilateral     just a lift    REPLACEMENT TOTAL KNEE Right          FamHx:  Family History   Problem Relation Age of Onset    Other Mother         MVA    Other Father         MVA    No Known Problems Sister     Other Sister         MVA    Other Sister         MVA drunk driver    Cancer Brother         liver    Cancer Brother         Lung    No Known Problems Brother     Cancer Maternal Aunt     Cancer Maternal Uncle     Diabetes Paternal Uncle     No Known Problems Maternal Grandmother     No Known Problems Maternal Grandfather     No Known Problems Paternal Grandmother     No Known Problems Paternal Grandfather     No Known Problems Daughter     No Known Problems Son     No Known Problems Other     Heart failure Neg Hx     BRCA 1/2 Neg Hx     Breast cancer Neg Hx     Colon cancer Neg Hx     Endometrial cancer Neg Hx     Ovarian cancer Neg Hx     Glaucoma Neg Hx     Macular degeneration Neg Hx        Social History:  Social History     Socioeconomic History    Marital status: Divorced   Tobacco Use    Smoking status: Former     Current packs/day: 0.00     Average packs/day: 2.0 packs/day for 48.0 years (96.0 ttl pk-yrs)     Types: Cigarettes     Start date: 11/06/1963     Quit date: 11/06/2011     Years since quitting: 10.9     Passive exposure: Past    Smokeless tobacco: Never   Vaping Use    Vaping Use: Never used   Substance and Sexual Activity    Alcohol use: No    Drug use: No    Sexual activity: Not Currently   Social History Narrative    She lives  in [place]. 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 She denies alcohol and illicit drug use.       Social Determinants of Health     Financial Resource Strain: Medium Risk (11/19/2020)    Overall Financial Resource Strain (CARDIA)     Difficulty of Paying Living Expenses: Somewhat hard   Food Insecurity: Food Insecurity Present (11/19/2020)    Hunger Vital Sign     Worried About Running Out of Food in the Last Year: Sometimes true     Ran Out of Food in the Last Year: Never true   Transportation Needs: No Transportation Needs (06/17/2021)    PRAPARE - Therapist, art (Medical): No     Lack of Transportation (Non-Medical): No         Medications:    Current Outpatient Medications:     amlodipine (NORVASC) 5 MG tablet, Take 1 tablet (5 mg total) by mouth daily., Disp: 30 tablet, Rfl: 6    aspirin-calcium carbonate 81 mg-300 mg calcium(777 mg) Tab, Take 81 mg by mouth., Disp: , Rfl:     cephalexin (KEFLEX) 250 MG capsule, Take 1 capsule daily for UTI prevention, Disp: 30 capsule, Rfl: 11    cholecalciferol, vitamin D3, (VITAMIN D3 ORAL), Take by mouth., Disp: , Rfl:     clonazePAM (KLONOPIN) 0.5 MG tablet, Take 3 tabs per day as directed, Disp: 90 tablet, Rfl: 2    docusate sodium (COLACE) 100 MG capsule, Take 1 capsule (100 mg total) by mouth daily as needed., Disp: , Rfl:     evolocumab (REPATHA SURECLICK) 140 mg/mL PnIj, Inject the contents of 1 pen (140 mg) under the skin every fourteen (14) days., Disp: 2 mL, Rfl: 11    ferrous gluconate 324 MG tablet, Take 1 tablet (324 mg total) by mouth once., Disp: , Rfl:     FLUoxetine (PROZAC) 20 MG capsule, Take 1 capsule (20 mg total) by mouth daily., Disp: 90 capsule, Rfl: 3    levETIRAcetam (KEPPRA) 1000 MG tablet, TAKE 1 TABLET BY MOUTH 2 TIMES DAILY, Disp: 180 tablet, Rfl: 3    levETIRAcetam (KEPPRA) 250 MG tablet, Take 1 tablet (250 mg total) by mouth two (2) times a day., Disp: 180 tablet, Rfl: 3    metFORMIN (GLUCOPHAGE) 500 MG tablet, Take 2 tablets (1,000 mg total) by mouth in the morning and 2 tablets (1,000 mg total) in the evening. Take with meals., Disp: 360 tablet, Rfl: 1    omeprazole 20 mg TbLD, Take by mouth nightly as needed., Disp: , Rfl:     ONETOUCH ULTRA TEST Strp, , Disp: , Rfl:     pravastatin (PRAVACHOL) 10 MG tablet, Take 0.5 tablet once daily, Disp: 45 tablet, Rfl: 1    SYNTHROID 100 mcg tablet, Take 1 tablet (100 mcg total) by mouth daily., Disp: 90 tablet, Rfl: 3    traZODone (DESYREL) 50 MG tablet, Take 1/2 to 1 tab at bedtime as directed, Disp: 30 tablet, Rfl: 11    vitamin E acetate (VITAMIN E ORAL), Take by mouth., Disp: , Rfl:     empty container (SHARPS-A-GATOR DISPOSAL SYSTEM) Misc, Use as directed for sharps disposal, Disp: 1 each, Rfl: 2      Allergies:  No Known Allergies         Objective:     Physical Exam:     Vitals:    09/30/22 0837   BP: 142/84  BP Site: R Arm   BP Position: Sitting   BP Cuff Size: Medium   Pulse: 97   Resp: 18   Weight: 86.2 kg (190 lb 2 oz)   Height: 167.6 cm (5' 6) Body mass index is 30.69 kg/m??.      Awake alert appropriate  EOMI  Tongue midline  Face symmetric  Clear speech  Chest: CTA, no wheeze  CVS: S1-S2 RRR  Follows commands  Moves all extremities spontaneously and antigravity      Data/ Lab/ Inventory Review:   Blood/ serum laboratory tests  Ferritin   Date Value Ref Range Status   08/11/2022 91.8 7.3 - 270.7 ng/mL Final     Iron Saturation (%)   Date Value Ref Range Status   07/14/2022 26 20 - 55 % Final     TSH   Date Value Ref Range Status   01/12/2022 1.171 0.550 - 4.780 uIU/mL Final     Free T4   Date Value Ref Range Status   01/20/2017 1.72 (H) 0.71 - 1.40 ng/dL Final     Vitamin Z-61   Date Value Ref Range Status   04/28/2022 425 211 - 911 pg/ml Final     CO2   Date Value Ref Range Status   09/10/2022 28.0 20.0 - 31.0 mmol/L Final           Author:  Patrcia Dolly, PA 09/30/2022 9:55 AM    Note - This record has been created using [employer]. Chart creation errors have been sought, but may not always have been located. Such creation errors do not reflect on the standard of medical care.

## 2022-10-01 ENCOUNTER — Ambulatory Visit: Admit: 2022-10-01 | Discharge: 2022-10-01 | Payer: PRIVATE HEALTH INSURANCE

## 2022-10-01 ENCOUNTER — Ambulatory Visit
Admit: 2022-10-01 | Discharge: 2022-10-01 | Payer: PRIVATE HEALTH INSURANCE | Attending: Adult Health | Primary: Adult Health

## 2022-10-01 DIAGNOSIS — Z170 Estrogen receptor positive status [ER+]: Principal | ICD-10-CM

## 2022-10-01 DIAGNOSIS — C50812 Malignant neoplasm of overlapping sites of left female breast: Principal | ICD-10-CM

## 2022-10-01 DIAGNOSIS — M25552 Pain in left hip: Principal | ICD-10-CM

## 2022-10-01 DIAGNOSIS — M25551 Pain in right hip: Principal | ICD-10-CM

## 2022-10-01 NOTE — Unmapped (Signed)
[facility] Refill Coordination Note    Specialty Lite Medication(s) to be Shipped:   General Specialty: Repatha    Other medication(s) to be shipped: No additional medications requested for fill at this time     Amber Duncan, DOB: [birth year]  Phone: [phone] (home)       All above HIPAA information was verified with patient.     Was a Nurse, learning disability used for this call? No    Changes to medications: [name] reports no changes at this time.  Changes to insurance: No      REFERRAL TO PHARMACIST     Referral to the pharmacist: Not needed      [facility]     Shipping address confirmed in Epic.     Delivery Scheduled: Yes, Expected medication delivery date: 10/06/22.     Medication will be delivered via Same Day Courier to the prescription address in Epic WAM.    [name], PharmD   [facility] Specialty Pharmacist

## 2022-10-01 NOTE — Unmapped (Signed)
It was a pleasure to see you today in the Breast Surgical Oncology Clinic. Please call my nurse navigator, Alinda Dooms or Willia Craze, if you have any interval questions or concerns.     Follow up in 1 year with imaging.  Continue surveillance as outlined. NCCN recommends that you continue to see your primary care provider for all general health care recommended for a patient your age, including cancer screening tests.  Any symptoms should be brought to the attention of your provider.  You should see your medical oncologist every 3 to 6 months for the first 3 years, every 6 to 12 months for years 4 and 5, and annually thereafter. Visits with your surgeon and radiation oncologist should be rotated with your medical oncology team initially until you are released to medical oncology or a survivorship clinic. The surgery visit should typically be paired with the imaging appointment so that we can review mammogram findings and recommendations.         Surgical oncology contact information:  For appointments, please call [phone].     Nurse Navigator available Monday through Friday 8:30 am to 4:30 pm:   Alinda Dooms, RN or Willia Craze, RN:    Phone: [phone]   Fax: [phone] attention Alinda Dooms, RN or Willia Craze, RN    For emergencies, evenings or weekends, please call [phone] and ask for the surgical oncology resident on call. Please be aware that this person is also responding to in-hospital emergencies and patient issues and may not answer your phone call immediately, but will return your call as soon as possible.

## 2022-10-03 NOTE — Unmapped (Signed)
Patient Name: Amber Duncan  Patient Age: 72 y.o.  Encounter Date: 10/01/2022    Referring Physician:   Peterson Lombard, MD  [address]  [place],  [place] [address]    Primary Care Provider:  [name], MD    Breast Cancer Treatment Team:  Surgical Oncology: Sherilyn Cooter, DO  Surgical Oncology NP: Pollyann Samples, NP  Radiation Oncology: Rayetta Humphrey, MD  Medical Oncology: Royal Hawthorn, MD (has also been followed by Rickard Patience, MD at [facility])    Reason for Visit: Breast cancer surveillance visit.     HPI:  Amber Duncan is a 72 y.o. female who presents for followup after breast cancer surgery. Below is a synopsis of past and present health information pertinent to her breast cancer diagnosis, treatment and current health status. She is unaccompanied today. Quinetta was last seen in clinic on 09/19/2021. She notes she has had a difficult past couple of months.  She has been having difficulty with dystonia. She states that this is getting better, but she feels as though she is being squeezed around her diaphragm and back.  She has also been diagnosed with interstitial lung disease.  She has been having trouble with dizziness and is scheduled for a sleep study.  She has seen neurology and states that this might be related to her dystonia. She recently fell and experienced a blow out fracture of her right orbit. She notes some residual discomfort. She also experienced a retinal tear for which she is following with ophthalmology. She notes bilateral hip pain L>R. She has never seen orthopaedics.  She notes her gait is off balance and she staggers quite a bit.  This limites her activity. She otherwise denies associated symptoms including redness, fever, chills, breast retraction, breast pain, nipple inversion, breast swelling or nipple discharge. No lymphedema or difficulty with ROM.  She did recently lose a grandchild to accidental overdose. She notes good social support. No other specific complaints today.   No other changes to her medical, surgical, family or social history.     Historically, Rozena presented with an abnormal screening mammogram    Diagnosis:    Left breast infiltrating ductal carcinoma   Cancer Staging   Malignant neoplasm of lower-inner quadrant of left breast in female, estrogen receptor positive (CMS-HCC)  Staging form: Breast, AJCC 8th Edition  - Clinical stage from 12/27/2019: Stage IA (cT1b, cN0, cM0, G1, ER+, PR+, HER2-) - Signed by Talbert Cage, DO on 12/27/2019  - Pathologic stage from 01/29/2020: Stage IA (pT1b, pN0, cM0, G1, ER+, PR+, HER2-) - Signed by Talbert Cage, DO on 02/06/2020      Procedure:   Left US guided partial mastectomy, performed on 01/19/2020.  SLNBx omitted per [facility] criteria.    Clinical Trial Participant:   No clinical trials at this time.    Medical Oncology: declined by patient with Royal Hawthorn, MD    Radiation Oncology: whole breast irradiation with  Rayetta Humphrey, MD    Review of Systems:  10 organ systems reviewed and pertinent as noted in HPI.       Medical history reviewed as below. I have also reviewed additional records as provided.  Hematology/Oncology History Overview Note   2021: Left breast pT1b N0 IDC, G1, +/+/-     Malignant neoplasm of lower-inner quadrant of left breast in female, estrogen receptor positive (CMS-HCC)   10/24/2019 -  Presenting Symptoms    Abnormal screening MMG: asymmetry in the left breast  11/07/2019 Interval Scan(s)    MMG with tomo: Left breast LOQ small mass with associated distortion.  [place]: Left breast 7 x 4 x 6 mm irregular hypoechoic mass at the 5 o???clock 4 CFN position.     11/15/2019 Biopsy    Left breast stereotactic biopsy 5:00, 4CFN: IDC, G1, ER 95% positive, PR 80% positive. HER2- (1+) and associated DCIS.     12/26/2019 Initial Diagnosis    Malignant neoplasm of lower-inner quadrant of left breast in female, estrogen receptor positive (CMS-HCC)     12/27/2019 Tumor Board    MDC recs: Left breast cT1b N0 IDC, G1, +/+/-. 7 mm on imaging review, excellent candidate for BCT. Candidate to omit axillary staging. Possible candidate to omit radiation. Will see med/onc post op.      12/27/2019 -  Cancer Staged    Staging form: Breast, AJCC 8th Edition  - Clinical stage from 12/27/2019: Stage IA (cT1b, cN0, cM0, G1, ER+, PR+, HER2-) - Signed by Talbert Cage, DO on 12/27/2019       01/19/2020 Surgery    Left US-guided partial mastectomy performed by Dr. Dellis Anes. Final pathology showed IDC 7mm in greatest dimension with DCIS grade 2, all margins clear     01/29/2020 -  Cancer Staged    Staging form: Breast, AJCC 8th Edition  - Pathologic stage from 01/29/2020: Stage IA (pT1b, pN0, cM0, G1, ER+, PR+, HER2-) - Signed by Talbert Cage, DO on 02/06/2020         Past Medical History:   Diagnosis Date    Breast cancer (CMS-HCC)     Chronic UTI     COPD (chronic obstructive pulmonary disease) (CMS-HCC)     Coronary artery disease     Diabetes (CMS-HCC)     Disease of thyroid gland     Eye trauma 06/17/2022    injury due to a fall on right eye    Fatigue     Hepatitis     Hepatitis     Hyperlipidemia     Hypertension     Migraine     Sleep apnea      Past Surgical History:   Procedure Laterality Date    BACK SURGERY      BREAST BIOPSY Left 11/2019    positive    BREAST LUMPECTOMY Left 01/2020    CATARACT EXTRACTION      CHOLECYSTECTOMY      CORONARY STENT PLACEMENT      HYSTERECTOMY      KNEE SURGERY      PR COLONOSCOPY,ABLATE LESION N/A 01/21/2017    Procedure: COLONOSCOPY FLEX; W/ABLAT LES NOT AMENABLE-SNARE;  Surgeon: Luanne Bras, MD;  Location: [facility];  Service: Gastroenterology    PR COLSC FLX W/RMVL OF TUMOR POLYP LESION SNARE TQ N/A 01/21/2017    Procedure: COLONOSCOPY FLEX; W/REMOV TUMOR/LES BY SNARE;  Surgeon: Luanne Bras, MD;  Location: [facility];  Service: Gastroenterology    PR MASTECTOMY, PARTIAL Left 01/19/2020    Procedure: MASTECTOMY, PARTIAL (EG, LUMPECTOMY, TYLECTOMY, QUADRANTECTOMY, SEGMENTECTOMY);  Surgeon: Talbert Cage, DO;  Location: MAIN OR [facility];  Service: Surgical Oncology Breast    PR UPPER GI ENDOSCOPY,BIOPSY N/A 04/29/2018    Procedure: UGI ENDOSCOPY; WITH BIOPSY, SINGLE OR MULTIPLE;  Surgeon: Rona Ravens, MD;  [facility]: [facility];  Service: Gastroenterology    RADIATION Left     REDUCTION MAMMAPLASTY Bilateral     just a lift    REPLACEMENT  TOTAL KNEE Right      Family History   Problem Relation Age of Onset    Other Mother         MVA    Other Father         MVA    No Known Problems Sister     Other Sister         MVA    Other Sister         MVA drunk driver    No Known Problems Daughter     No Known Problems Maternal Grandmother     No Known Problems Maternal Grandfather     No Known Problems Paternal Grandmother     No Known Problems Paternal Grandfather     Cancer Brother         liver    Cancer Brother         Lung    No Known Problems Brother     No Known Problems Son     Breast cancer Maternal Aunt     Cancer Maternal Aunt     Cancer Maternal Uncle     Diabetes Paternal Uncle     No Known Problems Other     Heart failure Neg Hx     BRCA 1/2 Neg Hx     Colon cancer Neg Hx     Endometrial cancer Neg Hx     Ovarian cancer Neg Hx     Glaucoma Neg Hx     Macular degeneration Neg Hx      Social History     Socioeconomic History    Marital status: Divorced   Tobacco Use    Smoking status: Former     Current packs/day: 0.00     Average packs/day: 2.0 packs/day for 48.0 years (96.0 ttl pk-yrs)     Types: Cigarettes     Start date: 11/06/1963     Quit date: 11/06/2011     Years since quitting: 10.9     Passive exposure: Past    Smokeless tobacco: Never   Vaping Use    Vaping Use: Never used   Substance and Sexual Activity    Alcohol use: No    Drug use: No    Sexual activity: Not Currently   Social History Narrative    She lives in [place]. 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 She denies alcohol and illicit drug use.       Social Determinants of Health     Financial Resource Strain: Medium Risk (11/19/2020)    Overall Financial Resource Strain (CARDIA)     Difficulty of Paying Living Expenses: Somewhat hard   Food Insecurity: Food Insecurity Present (11/19/2020)    Hunger Vital Sign     Worried About Running Out of Food in the Last Year: Sometimes true     Ran Out of Food in the Last Year: Never true   Transportation Needs: No Transportation Needs (06/17/2021)    PRAPARE - Therapist, art (Medical): No     Lack of Transportation (Non-Medical): No       Current Outpatient Medications:     amlodipine (NORVASC) 5 MG tablet, Take 1 tablet (5 mg total) by mouth daily., Disp: 30 tablet, Rfl: 6    aspirin-calcium carbonate 81 mg-300 mg calcium(777 mg) Tab, Take 81 mg by mouth., Disp: , Rfl:     cephalexin (KEFLEX) 250 MG capsule, Take  1 capsule daily for UTI prevention, Disp: 30 capsule, Rfl: 11    cholecalciferol, vitamin D3, (VITAMIN D3 ORAL), Take by mouth., Disp: , Rfl:     clonazePAM (KLONOPIN) 0.5 MG tablet, Take 3 tabs per day as directed, Disp: 90 tablet, Rfl: 2    docusate sodium (COLACE) 100 MG capsule, Take 1 capsule (100 mg total) by mouth daily as needed., Disp: , Rfl:     empty container (SHARPS-A-GATOR DISPOSAL SYSTEM) Misc, Use as directed for sharps disposal, Disp: 1 each, Rfl: 2    evolocumab (REPATHA SURECLICK) 140 mg/mL PnIj, Inject the contents of 1 pen (140 mg) under the skin every fourteen (14) days., Disp: 2 mL, Rfl: 11    ferrous gluconate 324 MG tablet, Take 1 tablet (324 mg total) by mouth once., Disp: , Rfl:     FLUoxetine (PROZAC) 20 MG capsule, Take 1 capsule (20 mg total) by mouth daily., Disp: 90 capsule, Rfl: 3    levETIRAcetam (KEPPRA) 1000 MG tablet, TAKE 1 TABLET BY MOUTH 2 TIMES DAILY, Disp: 180 tablet, Rfl: 3    levETIRAcetam (KEPPRA) 250 MG tablet, Take 1 tablet (250 mg total) by mouth two (2) times a day., Disp: 180 tablet, Rfl: 3    metFORMIN (GLUCOPHAGE) 500 MG tablet, Take 2 tablets (1,000 mg total) by mouth in the morning and 2 tablets (1,000 mg total) in the evening. Take with meals., Disp: 360 tablet, Rfl: 1    omeprazole 20 mg TbLD, Take by mouth nightly as needed., Disp: , Rfl:     ONETOUCH ULTRA TEST Strp, , Disp: , Rfl:     pravastatin (PRAVACHOL) 10 MG tablet, Take 0.5 tablet once daily, Disp: 45 tablet, Rfl: 1    SYNTHROID 100 mcg tablet, Take 1 tablet (100 mcg total) by mouth daily., Disp: 90 tablet, Rfl: 3    traZODone (DESYREL) 50 MG tablet, Take 1/2 to 1 tab at bedtime as directed, Disp: 30 tablet, Rfl: 11    vitamin E acetate (VITAMIN E ORAL), Take by mouth., Disp: , Rfl:   No Known Allergies      Physical Examination:   Blood pressure 148/77, pulse 72, temperature 36.4 ??C (97.6 ??F), temperature source Temporal, resp. rate 18, height 167.6 cm (5' 6), weight 86.2 kg (190 lb), SpO2 99 %.  General Appearance:  No acute distress, well appearing and well nourished.   Head:  Normocephalic, atraumatic.   Eyes:  Conjuctiva and lids appear normal. Pupils equal and round,   sclera anicteric.   Ears:  Overall appearance normal with no scars, lesions or               masses.  Hearing is grossly normal.   Neck: Supple, symmetrical, trachea midline, no adenopathy;   thyroid: no enlargement/tenderness/nodules.   Pulmonary:    Normal respiratory effort.    Musculoskeletal: Normal gait. Extremities without clubbing, cyanosis, or           edema.   Skin: Skin color, texture, turgor normal, no rashes or lesions.   Neurologic: No motor abnormalities noted. Sensation grossly intact.   Lymphatic:  Breast: No cervical or supraclavicular LAD noted.   A comprehensive examination of the breasts and chest wall was performed with the patient upright and supine with arms at her sides and above her head.   Left breast normal in size, normal symmetry, normal contour, no dimpling, nipple everted, nipple exhibits no crusting or excoriation, no masses or nodules, no palpable axillary adenopathy. Skin is slightly hyperpigmented  from prior radiation. The breast is tender to palpation. infraclavicular and supraclavicular nodal examinations are negative and arm circumference appears symmetric without evidence of lymphedema. Cosmetic outcome is excellent.  Right breast normal in size, normal symmetry, normal contour, no dimpling, no skin changes, nipple everted, nipple exhibits no crusting or excoriation, no masses or nodules, no palpable axillary adenopathy. The breast is tender to palpation    Psychiatric: Judgement and insight appropriate. Oriented to person,         place, and time.       Imaging Review:  I personally reviewed all diagnostic imaging, including mammogram and ultrasound      Impression   No mammographic evidence of malignancy. No suspicious sonographic finding in the region of focal pain in the lower central left breast.      ASSESSMENT:   BI-RADS Category: 2-Mammo1Yr : Benign.  Annual mammography is recommended.      Recommendation Laterality: Both      Diagnostic mammogram is recommended. Clinical follow-up is recommended for the patient's left breast pain.      The findings and recommendation were discussed in full with the patient. A result letter was given to the patient.                 Narrative   EXAM: MAMMO DIGITAL DIAGNOSTIC TOMO BILATERAL, US BREAST LIMITED LEFT   ACCESSION: [id number] Bethann Humble [id number] [place]      CLINICAL INDICATION: Focal pain in the lower central left breast. History of left breast cancer in May 2021, status post BCT.      COMPARISON: Prior exam(s) were available and reviewed for comparison      TECHNIQUE: Full-field and spot digital diagnostic mammography with tomosynthesis was performed. Tomosynthesis imaging was used to improve the sensitivity and specificity of the detection of breast cancer. Targeted left breast ultrasound.      BREAST DENSITY: b - There are scattered areas of fibroglandular density.      FINDINGS:   Bilateral diagnostic mammogram:   There are postlumpectomy changes in the left breast.   A triangle was placed on the skin over the site of focal pain in the lower central left breast. No suspicious mammographic finding underlies the triangle.   There is no suspicious mass, calcifications, or other abnormality in either breast.   There is no significant change compared with prior mammography.      Targeted left breast ultrasound:   Targeted ultrasound was performed of the lower central left breast. No suspicious sonographic finding is present. Representative negative images document the left breast at 6:00, 12 cm from the nipple.           I have personally reviewed the breast imaging, laboratory values, existing medical records, and pathology. I have summarized these findings and my interpretation in the oncology history and assessment above.    Impression:  Ms. Gassaway is a 72 y.o. female with a history of left breast infiltrating ductal carcinoma, estrogen receptor positive, progesterone receptor positive and HER2 negative. See treatment summary above.   Staging   Cancer Staging   Malignant neoplasm of lower-inner quadrant of left breast in female, estrogen receptor positive (CMS-HCC)  Staging form: Breast, AJCC 8th Edition  - Clinical stage from 12/27/2019: Stage IA (cT1b, cN0, cM0, G1, ER+, PR+, HER2-) - Signed by Talbert Cage, DO on 12/27/2019  - Pathologic stage from 01/29/2020: Stage IA (pT1b, pN0, cM0, G1, ER+, PR+, HER2-) - Signed by Talbert Cage, DO on 02/06/2020  There is no evidence of disease at  2 years.    Recommendations:  Patient previously declined endocrine therapy and is comfortable with this decision.   Follow-up in one year with mammogram. Sooner for any new breast complaints.  Denies the need for intervention for grief.  Referral to orthopaedics for work-up and management of hip pain.  Proceed with sleep study as scheduled.   Discussed options for management of her breast pain.  As it is not constant, she declines therapy.    All of the patient's questions and concerns were addressed during the visit and she is in agreement with the plan of care. Please do not hesitate to contact me with questions or concerns.     Cancer Staging   Malignant neoplasm of lower-inner quadrant of left breast in female, estrogen receptor positive (CMS-HCC)  Staging form: Breast, AJCC 8th Edition  - Clinical stage from 12/27/2019: Stage IA (cT1b, cN0, cM0, G1, ER+, PR+, HER2-) - Signed by Talbert Cage, DO on 12/27/2019  - Pathologic stage from 01/29/2020: Stage IA (pT1b, pN0, cM0, G1, ER+, PR+, HER2-) - Signed by Talbert Cage, DO on 02/06/2020      Pollyann Samples, NP-C  Surgical Breast Oncology  10/02/2022  4:20 PM

## 2022-10-05 ENCOUNTER — Ambulatory Visit: Admit: 2022-10-05 | Discharge: 2022-10-06 | Payer: PRIVATE HEALTH INSURANCE

## 2022-10-05 MED ORDER — OXYCODONE 5 MG TABLET
ORAL_TABLET | ORAL | 0 refills | 4 days | Status: CP | PRN
Start: 2022-10-05 — End: ?

## 2022-10-05 NOTE — Unmapped (Signed)
ORTHOPAEDIC NOTE     Anayla Giannetti L. Duaine Radin, PA-C        JORDIE BUMANN    MRN: [id number]  DOB: Jan 17, 1951    Date of visit: 10/05/2022    Clinic location: [place]     ASSESSMENT:     Left intra-articular distal radius with ulnar styloid and triquetral fracture sustained on 10/05/2022     PLAN:     I discussed with patient that she has a dorsal displaced distal radius fracture that will benefit from reduction but she has an intra-articular split component that still may benefit from surgical intervention  Prescription for oxycodone was provided for pain  She will work on range of motion exercises of her fingers and sling was provided  Closed Reduction/Splinting  Discussed risk, benefits, alternatives and complication of fracture reduction.  After verbal consent was obtained by the patient to perform closed reduction under local anesthetic, a time out was performed and the proper site was identified. The skin was disinfected using chloraprep swabs and a hematoma block was obtained by injecting 10 ml of 2% lidocaine.  The patient was then placed into finger cuffs suspended next to the treatment table to provide distraction to the left wrist.  Closed reduction of the fracture was performed and sugar-tong splint was placed.  Patient was neurovascularly intact post-reduction.  Post-reduction films showed adequate reduction of the fracture.  Patient will keep the splint clean and dry until next planned follow-up.    -Advised OTC analgesic PRN pain  -Discussed treatment options and patient was amenable to the above plan and was instructed to call and be seen if there is any increasing pain or concerns.     Follow up: 1 week, 3 views left wrist in splint       Chief Complaint:     Left wrist injury     SUBJECTIVE:     HPI: Amber Duncan is a RHD 72 y.o. with a PMHx DM, COPD, CAD, HLD, HTN, OSA presenting to [facility] complaint of left wrist injury sustained earlier today when she fell backwards onto an outstretched arm. She went to urgent care facility x-rays revealed displaced distal radius and ulna styloid fractures.  She was placed in a volar splint and referred to [facility] who recommended surgical intervention.  Denies numbness, tingling, weakness to her hand.       Allergies  No Known Allergies  Past Medical History  Past Medical History:   Diagnosis Date    Breast cancer (CMS-HCC)     Chronic UTI     COPD (chronic obstructive pulmonary disease) (CMS-HCC)     Coronary artery disease     Diabetes (CMS-HCC)     Disease of thyroid gland     Eye trauma 06/17/2022    injury due to a fall on right eye    Fatigue     Hepatitis     Hepatitis     Hyperlipidemia     Hypertension     Migraine     Sleep apnea         PHYSICAL EXAM:     MSK: Left wrist  Inspection: Edema and ecchymosis on the distal radius with abrasion on the distal ulna no erythema, skin intact  Palpation: Tenderness on the distal radius and ulna  ROM: Full range of motion of the digits  Strength: Intact pincer grasp strength  normal sensation LUE  radial pulses easily palpable     Imaging   Four views  of the left Wrist independently reviewed and interpreted by myself show impacted intra-articular distal radius fracture with ulnar styloid fracture and triquetral fracture with slight dorsal angulation. No other obvious fractures, lucencies, dislocations, or acute abnormalities.    4 views left wrist obtained postreduction independent reviewed interpreted by myself show essentially slightly improved alignment of mildly impacted dorsally displaced distal radius fracture with ulnar styloid fracture    MEDICAL DECISION MAKING (level of service defined by 2/3 elements)     Number/Complexity of Problems Addressed 1 acute, uncomplicated illness or injury (99203/99213)   Amount/Complexity of Data to be Reviewed/Analyzed Independent interpretation of a test performed by another physician/other qualified health care professional (99204/99214)   Risk of Complications/Morbidity/Mortality of Management Prescription Medication (99204/99214)   DME ORDER:  Dx: S52.502A, Closed fracture of distal end of left radius, unspecified fracture morphology, initial encounter  Body Location: Shoulder and Arm  Should and Arm Orthotics: Arm Sling  Laterality: Left  Sizing: xl Has sugar tong splint  Special Instructions: in cr2    DME Upper Extremity,  , The patient was prescribed this orthosis to be used on the upper extremity for the purpose of reducing pain and providing support and protection.           cc:  [name], MD  *Patient note was created using Dragon Dictation sotware. Errors in syntax or grammar may not have been identified and edited on initial review.

## 2022-10-06 MED FILL — REPATHA SURECLICK 140 MG/ML SUBCUTANEOUS PEN INJECTOR: SUBCUTANEOUS | 84 days supply | Qty: 6 | Fill #2

## 2022-10-13 ENCOUNTER — Ambulatory Visit: Admit: 2022-10-13 | Discharge: 2022-10-14 | Payer: PRIVATE HEALTH INSURANCE

## 2022-10-13 MED ORDER — OXYCODONE 5 MG TABLET
ORAL_TABLET | Freq: Three times a day (TID) | ORAL | 0 refills | 7 days | Status: CP | PRN
Start: 2022-10-13 — End: ?

## 2022-10-13 NOTE — Unmapped (Signed)
ORTHOPAEDIC NOTE     Garrison Michie L. Alyzae Hawkey, PA-C        JASELLE CULBERT    MRN: [id number]  DOB: [birth year]    Date of visit: 10/13/2022    Clinic location: [place]     ASSESSMENT:     Stable alignment of left intra-articular distal radius fracture with ulnar styloid and triquetral fracture sustained and reduced on 10/05/2022     PLAN:     Findings reviewed with Dr. Jarold Motto who agrees with the below treatment plan  Splint was adjusted to take pressure off the MCP joints and a well molded short arm overwrap cast was applied on the left upper extremity  She was encouraged to work on range of motion exercises of her fingers  Limited supply of oxycodone was provided for pain  -Advised OTC analgesic PRN pain  -Discussed treatment options and patient was amenable to the above plan and was instructed to call and be seen if there is any increasing pain or concerns.     Follow up: 1 week, 3 views left wrist in splint       Chief Complaint:     Recheck left wrist     SUBJECTIVE:     HPI: Amber Duncan is a RHD 72 y.o. with a PMHx DM, COPD, CAD, HLD, HTN, OSA presenting to Clinic for reevaluation of left intra-articular distal radius fracture with ulnar styloid and triquetral fractures reduced on 10/05/2022.  She has been in a sugar-tong splint and states that she is doing okay but she is finished her pain medication.  She feels the splint is rubbing along the top of her hand and she has not been moving her fingers much.       Allergies  No Known Allergies  Past Medical History  Past Medical History:   Diagnosis Date    Breast cancer (CMS-HCC)     Chronic UTI     COPD (chronic obstructive pulmonary disease) (CMS-HCC)     Coronary artery disease     Diabetes (CMS-HCC)     Disease of thyroid gland     Eye trauma 06/17/2022    injury due to a fall on right eye    Fatigue     Hepatitis     Hepatitis     Hyperlipidemia     Hypertension     Migraine     Sleep apnea         PHYSICAL EXAM:     MSK: Left wrist in splint  Inspection: Mild swelling of the digits  Palpation: No pain with passive stretch of the digits  ROM: Stiffness and splint limits range of motion of the digits  Strength: Intact pincer grasp strength  normal sensation LUE to exposed skin  Brisk Capillary refill Left index finger     Imaging   4 views left wrist obtained in splint independently reviewed interpreted by myself show unchanged alignment of left distal radius and ulna fractures with questionable subtle widening of the DRUJ and first CMC DJD    DME ORDER:  Dx:  ,                   cc:  [name], MD  *Patient note was created using Dragon Dictation sotware. Errors in syntax or grammar may not have been identified and edited on initial review.

## 2022-10-20 ENCOUNTER — Ambulatory Visit: Admit: 2022-10-20 | Discharge: 2022-10-21 | Payer: PRIVATE HEALTH INSURANCE

## 2022-10-20 NOTE — Unmapped (Signed)
ORTHOPAEDIC NOTE     Amber Breece L. Izabelle Daus, PA-C        [facility]    MRN: [id number]  DOB: 03-23-1951    Date of visit: 10/20/2022    Clinic location: [place]     ASSESSMENT:     Left intra-articular distal radius with ulnar styloid fracture reduced on 10/05/2022     PLAN:     Elbow was freed from current cast  She will work on range of motion exercises of the fingers and elbow  -Advised OTC analgesic PRN pain  -Discussed treatment options and patient was amenable to the above plan and was instructed to call and be seen if there is any increasing pain or concerns.     Follow up: 1 week, 3 views left wrist in cast       Chief Complaint:     Recheck left wrist     SUBJECTIVE:     HPI: Amber Duncan is a RHD 72 y.o. with a PMHx DM, COPD, CAD, HLD, HTN, OSA presenting to Clinic for reevaluation of left intra-articular distal radius fracture with ulnar styloid and triquetral fracture sustained and reduced on 10/05/2022.  She has been in a sugar-tong splint.  Overall she states she is doing well but still has some discomfort and is having trouble finding a comfortable place to sleep       Allergies  No Known Allergies  Past Medical History  Past Medical History:   Diagnosis Date    Breast cancer (CMS-HCC)     Chronic UTI     COPD (chronic obstructive pulmonary disease) (CMS-HCC)     Coronary artery disease     Diabetes (CMS-HCC)     Disease of thyroid gland     Eye trauma 06/17/2022    injury due to a fall on right eye    Fatigue     Hepatitis     Hepatitis     Hyperlipidemia     Hypertension     Migraine     Sleep apnea         PHYSICAL EXAM:     MSK: Left wrist  Inspection: Mild swelling of the digits  Palpation: No pain with passive stretch of the digits  ROM: Stiffness and swelling limits range of motion of digits  Strength: Intact pincer grasp strength  normal sensation LUE  radial pulses easily palpable     Imaging   Four views of the left Wrist independently reviewed and interpreted by myself show stable alignment of left intra-articular distal radius fracture with minimally displaced ulnar styloid fracture. No other obvious fractures, lucencies, dislocations, or acute abnormalities.      DME ORDER:  Dx:  ,                   cc:  [name], MD  *Patient note was created using Dragon Dictation sotware. Errors in syntax or grammar may not have been identified and edited on initial review.

## 2022-10-27 ENCOUNTER — Ambulatory Visit: Admit: 2022-10-27 | Discharge: 2022-10-28 | Payer: PRIVATE HEALTH INSURANCE

## 2022-10-27 NOTE — Unmapped (Signed)
Patient Education        Wearing a Fiberglass Cast: Care Instructions  Overview     A cast protects a broken bone or other injury while it heals. Most casts are made of fiberglass. After a cast is put on, you can't remove it yourself. Your doctor or a technician will take it off.  Follow-up care is a key part of your treatment and safety. Be sure to make and go to all appointments, and call your doctor if you are having problems. It's also a good idea to know your test results and keep a list of the medicines you take.  How can you care for yourself at home?  General care  Follow your doctor's instructions for when you can start using the limb that has the cast. Fiberglass casts dry quickly and are soon hard enough to protect the injured arm or leg.  When it's okay to put weight on your leg or foot cast, don't stand or walk on it unless it's designed for walking.  Prop up the injured arm or leg on a pillow anytime you sit or lie down during the first 3 days. Try to keep it above the level of your heart. This will help reduce swelling.  Put ice or a cold pack on your cast for 10 to 20 minutes at a time. Try to do this every 1 to 2 hours for the next 3 days (when you are awake). Put a thin cloth between the ice and your cast. Keep the cast dry.  Be safe with medicines. Read and follow all instructions on the label.  If the doctor gave you a prescription medicine for pain, take it as prescribed.  If you are not taking a prescription pain medicine, ask your doctor if you can take an over-the-counter medicine.  Do exercises as instructed by your doctor or physical therapist. These exercises will help keep your muscles strong and your joints flexible while you heal.  Wiggle your fingers or toes on the injured arm or leg often. This helps reduce swelling and stiffness.  Water and your cast  Try to keep your cast as dry as you can. The fiberglass part of your cast can get wet. But getting the inside wet can cause problems.  Use a bag or tape a sheet of plastic to cover your cast when you take a shower or bath or when you have any other contact with water. (Don't take a bath unless you can keep the cast out of the water.) Moisture can collect under the cast and cause skin irritation and itching. It can make infection more likely if you have had surgery or have a wound under the cast.  If you have a water-resistant cast, ask your doctor how often it can get wet and how to take care of it.  Cast and skin care  Try blowing cool air from a hair dryer or fan into the cast to help relieve itching. Never stick items under your cast to scratch the skin.  Don't use oils or lotions near your cast. If the skin gets red or irritated around the edge of the cast, you may pad the edges with a soft material or use tape to cover them.  When should you call for help?   Call your doctor now or seek immediate medical care if:    You have increased or severe pain.     You feel a warm or painful spot under the cast.  You have problems with your cast. For example:  The skin under the cast burns or stings.  The cast feels too tight or too loose.  There is a lot of swelling near the cast. (Some swelling is normal.)  You have a new fever.  There is drainage or a bad smell coming from the cast.     Your foot or hand is cool or pale or changes color.     You have trouble moving your fingers or toes.     You have symptoms of a blood clot in your arm or leg (called a deep vein thrombosis). These may include:  Pain in the arm, calf, back of the knee, thigh, or groin.  Redness and swelling in the arm, leg, or groin.   Watch closely for changes in your health, and be sure to contact your doctor if:    The cast is breaking apart.     You are not getting better as expected.   Where can you learn more?  Go to [facility] at [email or web address] forces [email or web address] in the Menu. Enter O925 in the search box to learn more about Wearing a Fiberglass Cast: Care Instructions.  Current as of: March 24, 2022               Content Version: 13.9  ?? 2006-2023 [employer].   Care instructions adapted under license by [facility]. If you have questions about a medical condition or this instruction, always ask your healthcare professional. [employer] disclaims any warranty or liability for your use of this information.

## 2022-10-27 NOTE — Unmapped (Signed)
ORTHOPAEDIC NOTE     Nate Perri L. Lennyn Bellanca, PA-C        Amber Duncan    MRN: [id number]  DOB: 12/03/1950    Date of visit: 10/27/2022    Clinic location: [place]     ASSESSMENT:     Stable alignment of the left intra-articular distal radius with ulnar styloid and triquetral fracture sustained and reduced on 10/05/2022     PLAN:     New well molded short arm cast was applied in clinic today on the left upper extremity  She will work on range of motion exercises of her fingers  Prescription for occupational therapy was provided for when she comes out of her cast  -Advised OTC analgesic PRN pain  -Discussed treatment options and patient was amenable to the above plan and was instructed to call and be seen if there is any increasing pain or concerns.     Follow up: 3 weeks, 3 views left wrist out of cast       Chief Complaint:     Recheck left wrist     SUBJECTIVE:     HPI: Amber Duncan is a RHD 72 y.o. with a PMHx DM, COPD, CAD, HLD, HTN, OSA presenting to Clinic for reevaluation of left intra-articular distal radius with ulnar styloid and triquetral fracture sustained and reduced on 10/05/2022 she has been in a sugar-tong overwrap.  She has complaints of pain referable to her thumb.       Allergies  No Known Allergies  Past Medical History  Past Medical History:   Diagnosis Date    Breast cancer (CMS-HCC)     Chronic UTI     COPD (chronic obstructive pulmonary disease) (CMS-HCC)     Coronary artery disease     Diabetes (CMS-HCC)     Disease of thyroid gland     Eye trauma 06/17/2022    injury due to a fall on right eye    Fatigue     Hepatitis     Hepatitis     Hyperlipidemia     Hypertension     Migraine     Sleep apnea         PHYSICAL EXAM:     MSK: Left wrist  Range of motion: full range of motion of the lesser digits able to flex and extend the IP joint of the thumb  Palpation: No pain with passive stretch of the digits  Inspection: Mild swelling along the thumb IP joint without erythema or ecchymosis, skin intact  Strength: Full strength in flexion extension of the lesser finger PIP joints  normal sensation LUE  radial pulses easily palpable     Imaging   Four views of the left Wrist independently reviewed and interpreted by myself show stable alignment of left intra-articular distal radius with ulnar styloid fracture and thumb CMC DJD. No other obvious fractures, lucencies, dislocations, or acute abnormalities.    DME ORDER:  Dx:  ,                   cc:  Amber Milliner, MD  *Patient note was created using Dragon Dictation sotware. Errors in syntax or grammar may not have been identified and edited on initial review.

## 2022-10-30 ENCOUNTER — Ambulatory Visit: Admit: 2022-10-30 | Discharge: 2022-10-31 | Payer: PRIVATE HEALTH INSURANCE

## 2022-10-30 MED ADMIN — lidocaine (XYLOCAINE) 10 mg/mL (1 %) injection 4 mL: 4 mL | @ 13:00:00 | Stop: 2022-10-30

## 2022-10-30 MED ADMIN — triamcinolone acetonide (KENALOG-40) injection 40 mg: 40 mg | INTRA_ARTICULAR | @ 13:00:00 | Stop: 2022-10-30

## 2022-10-30 NOTE — Unmapped (Signed)
[facility]  Encounter Provider: Jake Seats, PA  Date of Service: 10/30/2022    CC: Pain of the Right Knee and Pain of the Left Knee     Pain Assessment: 0-10  0-10 Pain Scale: 2  Pain Location: Knee  Pain Orientation: Right, Left  Pain Descriptors: Pressure  Pain Frequency: Constant/continuous  Clinical Progression: Gradually worsening  Aggravating Factors: Walking, Standing  Result of Injury: Yes  Work-Related Injury: No  Pain Intervention Given: Home medication          HPI:  72 y.o. female presents for evaluation of lateral-sided bilateral hip pain, left greater than right. The patient states by their estimate there has been approximately several years of symptoms. The pain is characterized as severe. The location of the pain is reported as lateral. She relates that it does  wake them up at night. The pain has increased to the point at which activities of daily living have become affected, including getting up from a chair and general stability and balance with standing and walking. This has greatly reduced their overall functional capacity. She does not require ambulatory assistance, utilizing no walking aids. 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 The patient presents alone. They inquire about their treatment options for the pain. No previous injuries or surgeries to this joint.    Treatment History   Relevant pain medications:  acetaminophen    Injections:none tried    Physical therapy: no    Adjunct treatments: Activity Modification    In a pain clinic: No       ROS: Negative for fever, chills, chest pain, cough, SOB.    PMH/PSH:   has a past medical history of Breast cancer (CMS-HCC), Chronic UTI, COPD (chronic obstructive pulmonary disease) (CMS-HCC), Coronary artery disease, Diabetes (CMS-HCC), Disease of thyroid gland, Eye trauma (06/17/2022), Fatigue, Hepatitis, Hepatitis, Hyperlipidemia, Hypertension, Migraine, and Sleep apnea.     Social Hx:  Tobacco Use: Medium Risk (10/30/2022)    Patient History     Smoking Tobacco Use: Former     Smokeless Tobacco Use: Never     Passive Exposure: Past        DETAILED PHYSICAL EXAM (12 Point)  General Appearance Well-nourished, in no acute distress.   Mood and Affect Alert, cooperative and pleasant.   Pulmonary No labored breathing or shortness of breath   Cardiovascular Well-perfused distally and no edema.   Lymphatics No lymphadenopathy   Sensation Sensation to light touch distally normal    MUSCULOSKELETAL    Left Hip  Inspection/palpation  Range of motion  Stability  Strength  Skin Gait: Decreased cadence  Inspection: No swelling, erythema, or deformity  Palpation: Tender: greater trochanter  Range of motion:  Passive ROM includes painless arc  Strength:  4/5 strength with testing of flexion and abduction  Special Tests: No pain with Log Roll test  Skin:  Warm, dry, and intact       Imaging:  Radiographs of bilateral hip obtained today, independently interpreted, and reviewed with the patient. These demonstrate no advanced degenerative changes.  Those present are mild to moderate in nature. No evidence of fracture or dislocation.    Assessment:  1. Trochanteric bursitis of both hips    2. Bilateral hip pain         Plan:  72 y.o. female with   1. Trochanteric bursitis of both hips    2. Bilateral  hip pain        The patient and I thoroughly discussed non-surgical modalities versus surgical intervention. It was decided to proceed with non-operative treatment, as mobilization and strengthening are best course of treatment for this moving forward.    These recommendations include:    - Referral to formal physical therapy  - Ice affected area for 15-20 minutes every hour as needed  - Heat affected area for 20-30 minutes as needed  - Corticosteroid injection adminstered as described below  - Resume normal activities as tolerated    Patient demonstrated understanding and was in agreement with the plan outlined above. All questions were answered.    Patient will follow-up for repeat clinical evaluation in 3 month(s)  Radiographs needed if return anticipated: no    Lg Joint Inj: L greater trochanteric bursa on 10/30/2022 8:15 AM  Indications: pain and diagnostic evaluation  Details: 22 G needle, lateral approach  Laterality: left  Location: hip  Medications: 4 mL lidocaine 10 mg/mL (1 %); 40 mg triamcinolone acetonide 40 mg/mL  Outcome: tolerated well, no immediate complications    Hemostasis was obtained and a Band-Aid was applied.  Patient tolerated procedure without complication.  They understand that their pain should gradually resolve over the next 1 to 2 weeks.  Should they have continued discomfort they will contact our office.  Procedure, treatment alternatives, risks and benefits explained, specific risks discussed. Consent was given by the patient. Immediately prior to procedure a time out was called to verify the correct patient, procedure, equipment, support staff and site/side marked as required. Patient was prepped and draped in the usual sterile fashion.     Provider Attestation: The information documented by members of my medical care team was reviewed and verified for accuracy by me.         E&M Coding:    MEDICAL DECISION MAKING (level of service defined by 2/3 elements)     Number/Complexity of Problems Addressed 1 or more chronic illnesses with exacerbation, progression, or side effects of treatment (99204/99214)   Amount/Complexity of Data to be Reviewed/Analyzed 2 points: Review prior notes (1 point per unique source); Review test results (1 point per unique test); Order tests (1 point per unique test) (99203/99213)   Risk of Complications/Morbidity/Mortality of Management Decision for MINOR Surgery (Including Injection) WITHOUT Risk Factors (99203/99213)     Or TIME     Total Time for E/M Services on the Date of Encounter N/A

## 2022-11-17 ENCOUNTER — Ambulatory Visit: Admit: 2022-11-17 | Discharge: 2022-11-18 | Payer: PRIVATE HEALTH INSURANCE

## 2022-11-17 ENCOUNTER — Ambulatory Visit: Admit: 2022-11-17 | Payer: PRIVATE HEALTH INSURANCE

## 2022-11-17 ENCOUNTER — Ambulatory Visit
Admit: 2022-11-17 | Payer: PRIVATE HEALTH INSURANCE | Attending: Rehabilitative and Restorative Service Providers" | Primary: Rehabilitative and Restorative Service Providers"

## 2022-11-17 NOTE — Unmapped (Signed)
[facility] PT [facility]  OUTPATIENT PHYSICAL THERAPY  11/17/2022  Note Type: Evaluation       Patient Name: Amber Duncan  Date of Birth:September 05, 1951  Diagnosis:   Encounter Diagnoses   Name Primary?    Trochanteric bursitis of both hips     Muscle weakness Yes    Difficulty walking      Referring MD:  Paul Half, PA     Date of Onset of Impairment-No date available  Date PT Care Plan Established or Reviewed-No date available  Date PT Treatment Started-No date available   Plan of Care Effective Date:          Assessment/Plan:    Assessment  Assessment details:      [name] is a pleasant 72 y.o. female who presents for Physical Therapy Evaluation with B hip weakness. Primary impairments include decreased walking speed, decreased functional strength, and weakness of B hip abductors . Clinical presentation today is consistent with hip weakness. The patient will benefit from skilled Physical Therapy intervention to address the moderate impairments listed below and to assist the patient in maximizing her functional independence and safe return to prior level of function.             Impairments: decreased endurance, pain, decreased strength, decreased range of motion, impaired motor control, fall risk and dizziness/vertigo      Personal Factors/Comorbidities: 3+    Specific Comorbidities: active cancer, COPD, diabetes    Examination of Body Systems: musculoskeletal, activity/participation and communication    Clinical Presentation: evolving    Clinical Decision Making: moderate    Prognosis: good prognosis    Negative Prognosis Rationale: medical status/condition, chronicity of condition, severity of symptoms and strength.      Therapy Goals      Goals:        1. In 12 weeks the patient will demonstrate independent performance of HEP to maintain functional gains.   2. In 12 weeks the patient will demonstrate improved hip strength 4+/5 to indicate progression towards return to PLOF. (Current 4-/5)  3. In 12 weeks the patient will score improved 1 min walking speed distance  to indicate improved activity tolerance.         Plan    Therapy options: will be seen for skilled physical therapy services    Planned therapy interventions: Aquatic Therapy, Location manager, Education - Patient, Endurance Activites, Functional Mobility, Gait Training, Home Exercise Program, Manual Therapy, Neuromuscular Re-education, Therapeutic Activities, Therapeutic Exercises, Education - Family/Caregiver, Dry Needling, E-Stim, TENS, Self-Care/Home Training, Civil engineer, contracting and Diaphragmatic/Pursed-lip Breathing    DME Equipment: [employer].    Frequency: 1x month    Duration in weeks: 12    Education provided to: patient.    Education provided: HEP, Treatment options and plan, Symptom management, Safety education, Importance of Therapy, Anatomy, Body mechanics, Role of therapy in Rehabilitation, Posture, Community resources, Body awareness and Fluid intake    Education results: verbalized good understanding, demonstrates understanding and needs reinforcement.    Communication/Consultation: N/A.              Subjective:   History of Present Condition          Date of Onset:  11/16/2016  Subjective:     Pt reports that she received a shot in her L hip that has helped her signficantly.   Pt reports that her last fall in January and she broke multiple bones. Before then her last fall was in October.  Pt reports that hip pain has been going on for a while. Noticed how weak her hips were after breaking her wrist.   Pt reports that she has been diagnosed with a terminal lung cancer recently.   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        Objective:   Objective      Posture/Observations:   Sitting:increased kyphosis, forward head posture  Standing: increased kyphosis    Gait Assessment:  decreased gait speed    Lumbar Screen:  Pain free and WFL    Range of Motion/Flexibilty:     WFL      Strength/MMT:   LE MMT   LE MMT Left Right   Hip flex:  (L2) 4+/5 4+/5   Hip abd: 4-/5 4-/5   Knee ext:  (L3) 4+/5 4+/5   Knee flex: (S2) 4+/5 4+/5   Ankle DF:  (L4) 4+/5 4+/5             Functional Test/Outcome Measures:  Squat: slow, use of L hand for assistance     Pt amb 1ft in 1 minute    Treatment Rendered:    PT Evaluation:25 minutes    Therapeutic Exercise: 10 minutes  Patient was educated about proper performance of HEP exercises and was observed performing them correctly.  Patient was given a written handout of exercises for the HEP.  Exercises included:     Access Code: [email or web address]  URL: [email or web address]  Date: 11/17/2022  Prepared by: Consuello Closs    Exercises  - Bridge  - 1 x daily - 7 x weekly - 3 sets - 10 reps  - Sidelying Hip Abduction  - 1 x daily - 7 x weekly - 3 sets - 8 reps  - Sit to Stand with Counter Support  - 1 x daily - 7 x weekly - 3 sets - 10 reps      Total Time: 35 minutes      Patient Education:  Throughout evaluation patient educated regarding the following: Role of PT in Rehabilitation, HEP, importance of therapy, posture, body mechanics, body awareness, treatment plan, and Indications/Contraindications to Exercises. Patient demonstrated and verbalized agreement and understanding.                        Equipment provided/recommended:   written HEP                                   PT Evaluation Charges  $$ PT Evaluation - MOD Complexity [mins]: 25     Therapeutic Interventions Charges  $$ Therapeutic Exercise [mins]: 10                 I attest that I have reviewed the above information.  Signed: Westley Hummer, PT  11/17/2022 2:06 PM

## 2022-11-17 NOTE — Unmapped (Signed)
OUTPATIENT OCCUPATIONAL THERAPY    UPPER EXTREMITY EVALUATION    Patient Name: Amber Duncan  Date of Birth:Jul 17, 1951  Date: 11/17/2022  Visit #: 1  Plan of Care Certification Dates:   Encounter Diagnoses   Name Primary?    Other closed fracture of distal end of left radius with routine healing, subsequent encounter     Decreased range of motion of left wrist Yes   Reason for referral: Left distal radius and ulnar styloid fracture reduced on 10/05/2022  Please coordinate appointment with myself   Referring Provider: Gretel Acre Maslow  Onset of Symptoms: FOOSH 10/05/22  Per Referring Provider's note:   ASSESSMENT:      Stable alignment of left intra-articular distal radius with ulnar styloid fracture reduced on 10/05/2022      PLAN:      -Advised wrist brace for 2-4 weeks during lifting activities.  Remove the brace for ROM 3x daily, rest, sleep and hygiene purposes.  Avoid lifting over 5 lbs for 2 weeks.  She will proceed with occupational therapy  -Advised OTC analgesic PRN pain  -Discussed treatment options and patient was amenable to the above plan and was instructed to call and be seen if there is any increasing pain or concerns.      Follow up: 6 weeks, 3 views left wrist         Chief Complaint:      Recheck left wrist      SUBJECTIVE:      HPI: Amber Duncan is a RHD 72 y.o. with a PMHx DM, COPD, CAD, HLD, HTN, OSA presenting to Clinic for reevaluation of left intra-articular distal radius fracture with ulnar styloid and triquetral fracture sustained and reduced on 10/05/2022.  She has been in a short arm cast.  She has an appointment scheduled for occupational therapy later today and also physical therapy for her hip.  Overall she states that she doing well but she does have some pain when she moves her fingers and tries to bend her wrist.   Communication preference: verbal, written, visual  Prognosis: good due to motivation    OT ASSESSMENT:   72 y.o. year old female with above diagnosis. Patient requires skilled Occupational Therapy services for decreased range of motion, decreased strength, orthotic fit/management as appropriate.     Previous Level of Function: Pt was previously independent with all ADLs and IADLs.     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  This patient may have comorbidities (lives alone, HTN, DM 2)  affecting occupational performance.  Please refer to Current level of function section for further details.     Short Term Goals:  1. In 1 session, patient will perform home exercise program with need for cuing to max IND with ADLs and IADLs. (met)    OT  PLAN OF CARE:  Pt will participate in:  Self Care/Hometraining  Orthotic Fit/Management   Therapeutic Exercise  Therapeutic Activity   Neuromuscular Re-education  Ultrasound  Hot/Cold Pack  Electrical Stimulation  Iontophoresis  Orthotic/Prosthetic Measure and Fit   Joint Mobilization  Physical Performance Measure   Manual Therapy    Planned frequency and duration of treatment: 1x / week/ 1 weeks. Plan will be adjusted as necessary.  Pt requested to be seen today only due to financial concerns.  I offered to see her back the same day she sess Amber Duncan for a return, however she declined.     Patient in agreement with plan of care?: Yes    SUBJECTIVE:  Patient goals: work    PAST MEDICAL HISTORY:  Reviewed   Past Medical History:   Diagnosis Date    Breast cancer (CMS-HCC)     Chronic UTI     COPD (chronic obstructive pulmonary disease) (CMS-HCC)     Coronary artery disease     Diabetes (CMS-HCC)     Disease of thyroid gland     Eye trauma 06/17/2022    injury due to a fall on right eye    Fatigue     Hepatitis     Hepatitis     Hyperlipidemia     Hypertension     Migraine     Sleep apnea        Past Surgical History: Reviewed  Past Surgical History:   Procedure Laterality Date    BACK SURGERY      BREAST BIOPSY Left 11/2019    positive    BREAST LUMPECTOMY Left 01/2020    CATARACT EXTRACTION      CHOLECYSTECTOMY      CORONARY STENT PLACEMENT      HYSTERECTOMY      KNEE SURGERY      PR COLONOSCOPY,ABLATE LESION N/A 01/21/2017    Procedure: COLONOSCOPY FLEX; W/ABLAT LES NOT AMENABLE-SNARE;  Surgeon: Luanne Bras, MD;  Location: [facility] PROC[facility]URES [facility];  Service: Gastroenterology    PR COLSC FLX W/RMVL OF TUMOR POLYP LESION SNARE TQ N/A 01/21/2017    Procedure: COLONOSCOPY FLEX; W/REMOV TUMOR/LES BY SNARE;  Surgeon: Luanne Bras, MD;  Location: [facility];  Service: Gastroenterology    PR MASTECTOMY, PARTIAL Left 01/19/2020    Procedure: MASTECTOMY, PARTIAL (EG, LUMPECTOMY, TYLECTOMY, QUADRANTECTOMY, SEGMENTECTOMY);  Surgeon: Talbert Cage, DO;  Location: MAIN OR [facility];  Service: Surgical Oncology Breast    PR UPPER GI ENDOSCOPY,BIOPSY N/A 04/29/2018    Procedure: UGI ENDOSCOPY; WITH BIOPSY, SINGLE OR MULTIPLE;  Surgeon: Rona Ravens, MD;  [facility];  Service: Gastroenterology    RADIATION Left     REDUCTION MAMMAPLASTY Bilateral     just a lift    REPLACEMENT TOTAL KNEE Right        Allergies: Reviewed  Patient has no known allergies.    Medications: Reviewed    Current Outpatient Medications:     amlodipine (NORVASC) 5 MG tablet, Take 1 tablet (5 mg total) by mouth daily., Disp: 30 tablet, Rfl: 6    aspirin-calcium carbonate 81 mg-300 mg calcium(777 mg) Tab, Take 81 mg by mouth., Disp: , Rfl:     cephalexin (KEFLEX) 250 MG capsule, Take 1 capsule daily for UTI prevention, Disp: 30 capsule, Rfl: 11    cholecalciferol, vitamin D3, (VITAMIN D3 ORAL), Take by mouth., Disp: , Rfl:     clonazePAM (KLONOPIN) 0.5 MG tablet, Take 3 tabs per day as directed, Disp: 90 tablet, Rfl: 2    docusate sodium (COLACE) 100 MG capsule, Take 1 capsule (100 mg total) by mouth daily as needed., Disp: , Rfl:     empty container (SHARPS-A-GATOR DISPOSAL SYSTEM) Misc, Use as  directed for sharps disposal, Disp: 1 each, Rfl: 2    evolocumab (REPATHA SURECLICK) 140 mg/mL PnIj, Inject the contents of 1 pen (140 mg) under the skin every fourteen (14) days., Disp: 2 mL, Rfl: 11    ferrous gluconate 324 MG tablet, Take 1 tablet (324 mg total) by mouth once., Disp: , Rfl:     FLUoxetine (PROZAC) 20 MG capsule, Take 1 capsule (20 mg total) by mouth daily., Disp: 90 capsule, Rfl: 3    levETIRAcetam (KEPPRA) 1000 MG tablet, TAKE 1 TABLET BY MOUTH 2 TIMES DAILY, Disp: 180 tablet, Rfl: 3    levETIRAcetam (KEPPRA) 250 MG tablet, Take 1 tablet (250 mg total) by mouth two (2) times a day., Disp: 180 tablet, Rfl: 3    metFORMIN (GLUCOPHAGE) 500 MG tablet, Take 2 tablets (1,000 mg total) by mouth in the morning and 2 tablets (1,000 mg total) in the evening. Take with meals., Disp: 360 tablet, Rfl: 1    omeprazole 20 mg TbLD, Take by mouth nightly as needed., Disp: , Rfl:     ONETOUCH ULTRA TEST Strp, , Disp: , Rfl:     oxyCODONE (ROXICODONE) 5 MG immediate release tablet, Take 1 tablet (5 mg total) by mouth every eight (8) hours as needed for pain. (Patient not taking: Reported on 10/30/2022), Disp: 21 tablet, Rfl: 0    pravastatin (PRAVACHOL) 10 MG tablet, Take 0.5 tablet once daily, Disp: 45 tablet, Rfl: 1    SYNTHROID 100 mcg tablet, Take 1 tablet (100 mcg total) by mouth daily., Disp: 90 tablet, Rfl: 3    traZODone (DESYREL) 50 MG tablet, Take 1/2 to 1 tab at bedtime as directed, Disp: 30 tablet, Rfl: 11    vitamin E acetate (VITAMIN E ORAL), Take by mouth., Disp: , Rfl:     Precautions: No strengthening or weightbearing, orthosis on at all times except to perform change dressings,check skin, exercise    Prior OT Service: no - is having a PT evaluation today however wants to go closer to home/perform her exercises at home    Pain: 2/10 L wrist at rest soreness    OBJECTIVE    Sensation: intact per pt report    Upper Extremity Function:    Shoulder:  WFL     Elbow:  WFL    Hand:   Pt is right hand dominant               AROM (degrees) Date: 11/17/22  Right Date: 11/17/22  Left   Wrist   extension/flexion 63/45 40/25   Supination/pronation 75/81 60/61   rad/uln deviation 11/35 5/25   Composite flex to [facility]   (cm lack)     Index  2   Middle  2   Ring  2.3   Small     Digit Extension     Thumb  Opposition   To SF PIP volar crease      Grip and Pinch Strength Date:  Right Date:  Left   Gross Grip Strength (lbs)  Position 2 Female -  age 26-74 R: 49-78 L: 23-67    Trial 1     Trial 2     Trial 3          Average     Pinch Strength (lbs)     Lateral Pinch     3-point Pinch     Tip Pinch       **Pt experienced pain with this measurement.     Wound/Incision(s) or  Scar: none    Edema: moderate in hand. Educated pt on the need to perform active range of motion to allow adequate venous return and prevent stiffness.     Self Care/home training (20 min):  Issued HEP with patient demo (see below) with handouts provided to the patient.   Educated pt on weightbearing precautions and healing time-line.     Home Program:   Apply low to moderate heat 10 min prior to exercises for improved tissue extensibility  5 repetitions, 3 times per day, 5 second hold of each exercise:    Active wrist extension and flexion   Active radial and ulnar deviation  Active supination and pronation  Tendon gliding  Thumb opposition to all digits    Therapist wore a mask for the entirety of the session.     I reviewed the no-show/attendance policy with the patient and caregiver(s). The family is aware that they must call to cancel appointments more than 24 hours in advance. They are also aware that if they late cancel or no-show three times, we reserve the right to cancel their remaining appointments. This policy is in place to allow [place] to best serve the needs of our caseload.    Treatment Rendered:   Self Care/Home Training: 20 min    Total Treatment Time: 20 min  Total Evaluation Time: 30 mins    Patient Education:  Topics: home program, disease process  Education Provided to: patient  Education Type: education, demonstration, literature  Response to education/teachback: verbal understanding received, return demonstration    I attest that I have reviewed the above information.  SignedLinwood Dibbles, OT  11/17/2022 11:54 AM

## 2022-11-17 NOTE — Unmapped (Signed)
ORTHOPAEDIC NOTE     Amber Duncan Ammon, PA-C        Amber Duncan    MRN: [id number]  DOB: 08/17/1951    Date of visit: 11/17/2022    Clinic location: [place]     ASSESSMENT:     Stable alignment of left intra-articular distal radius with ulnar styloid fracture reduced on 10/05/2022     PLAN:     -Advised wrist brace for 2-4 weeks during lifting activities.  Remove the brace for ROM 3x daily, rest, sleep and hygiene purposes.  Avoid lifting over 5 lbs for 2 weeks.  She will proceed with occupational therapy  -Advised OTC analgesic PRN pain  -Discussed treatment options and patient was amenable to the above plan and was instructed to call and be seen if there is any increasing pain or concerns.     Follow up: 6 weeks, 3 views left wrist       Chief Complaint:     Recheck left wrist     SUBJECTIVE:     HPI: [name] is a RHD 72 y.o. with a PMHx DM, COPD, CAD, HLD, HTN, OSA presenting to Clinic for reevaluation of left intra-articular distal radius fracture with ulnar styloid and triquetral fracture sustained and reduced on 10/05/2022.  She has been in a short arm cast.  She has an appointment scheduled for occupational therapy later today and also physical therapy for her hip.  Overall she states that she doing well but she does have some pain when she moves her fingers and tries to bend her wrist.       Allergies  No Known Allergies  Past Medical History  Past Medical History:   Diagnosis Date    Breast cancer (CMS-HCC)     Chronic UTI     COPD (chronic obstructive pulmonary disease) (CMS-HCC)     Coronary artery disease     Diabetes (CMS-HCC)     Disease of thyroid gland     Eye trauma 06/17/2022    injury due to a fall on right eye    Fatigue     Hepatitis     Hepatitis     Hyperlipidemia     Hypertension     Migraine     Sleep apnea         PHYSICAL EXAM:     MSK: Left wrist  Inspection: Distal radial edema with mild ecchymosis on the volar aspect of the wrist without erythema, skin intact  Palpation: Mild tenderness on the distal radius, distal ulna and metacarpals  ROM: Wrist extension 5, wrist flexion 10, stiffness with range of motion of the digits  Strength: Mild weakness with pincer grasp strength  normal sensation LUE  radial pulses easily palpable     Imaging   Four views of the left Wrist independently reviewed and interpreted by myself show stable alignment of impacted intra-articular distal radius fracture with callus formation and ulnar styloid fracture. No other obvious fractures, lucencies, dislocations, or acute abnormalities.    MEDICAL DECISION MAKING (level of service defined by 2/3 elements)     Number/Complexity of Problems Addressed 1 acute, uncomplicated illness or injury (99203/99213)   Amount/Complexity of Data to be Reviewed/Analyzed Independent interpretation of a test performed by another physician/other qualified health care professional (99204/99214)   Risk of Complications/Morbidity/Mortality of Management Over-the-counter Medications (99203/99213)   DME ORDER:  Dx:  ,  cc:  [name], MD  *Patient note was created using Dragon Dictation sotware. Errors in syntax or grammar may not have been identified and edited on initial review.

## 2022-11-24 ENCOUNTER — Ambulatory Visit
Admit: 2022-11-24 | Discharge: 2022-11-24 | Payer: PRIVATE HEALTH INSURANCE | Attending: Cardiovascular Disease | Primary: Cardiovascular Disease

## 2022-11-24 ENCOUNTER — Ambulatory Visit: Admit: 2022-11-24 | Discharge: 2022-11-24 | Payer: PRIVATE HEALTH INSURANCE

## 2022-11-24 DIAGNOSIS — E782 Mixed hyperlipidemia: Principal | ICD-10-CM

## 2022-11-24 DIAGNOSIS — I2510 Atherosclerotic heart disease of native coronary artery without angina pectoris: Principal | ICD-10-CM

## 2022-11-24 DIAGNOSIS — R634 Abnormal weight loss: Principal | ICD-10-CM

## 2022-11-24 DIAGNOSIS — D508 Other iron deficiency anemias: Principal | ICD-10-CM

## 2022-11-24 DIAGNOSIS — E611 Iron deficiency: Principal | ICD-10-CM

## 2022-11-24 DIAGNOSIS — E119 Type 2 diabetes mellitus without complications: Principal | ICD-10-CM

## 2022-11-24 DIAGNOSIS — I10 Essential (primary) hypertension: Principal | ICD-10-CM

## 2022-11-24 DIAGNOSIS — C50312 Malignant neoplasm of lower-inner quadrant of left female breast: Principal | ICD-10-CM

## 2022-11-24 DIAGNOSIS — Z170 Estrogen receptor positive status [ER+]: Principal | ICD-10-CM

## 2022-11-24 LAB — CBC
HEMATOCRIT: 38.1 % (ref 34.0–44.0)
HEMOGLOBIN: 13 g/dL (ref 11.3–14.9)
MEAN CORPUSCULAR HEMOGLOBIN CONC: 34 g/dL (ref 32.0–36.0)
MEAN CORPUSCULAR HEMOGLOBIN: 33.1 pg — ABNORMAL HIGH (ref 25.9–32.4)
MEAN CORPUSCULAR VOLUME: 97.3 fL — ABNORMAL HIGH (ref 77.6–95.7)
MEAN PLATELET VOLUME: 8.1 fL (ref 6.8–10.7)
PLATELET COUNT: 211 10*9/L (ref 150–450)
RED BLOOD CELL COUNT: 3.92 10*12/L — ABNORMAL LOW (ref 3.95–5.13)
RED CELL DISTRIBUTION WIDTH: 13 % (ref 12.2–15.2)
WBC ADJUSTED: 5.6 10*9/L (ref 3.6–11.2)

## 2022-11-24 LAB — LIPASE: LIPASE: 30 U/L (ref 12–53)

## 2022-11-24 LAB — COMPREHENSIVE METABOLIC PANEL
ALBUMIN: 4.5 g/dL (ref 3.4–5.0)
ALKALINE PHOSPHATASE: 73 U/L (ref 46–116)
ALT (SGPT): <7 U/L — ABNORMAL LOW (ref 10–49)
ANION GAP: 6 mmol/L (ref 5–14)
AST (SGOT): 16 U/L (ref <=34)
BILIRUBIN TOTAL: 0.4 mg/dL (ref 0.3–1.2)
BLOOD UREA NITROGEN: 20 mg/dL (ref 9–23)
BUN / CREAT RATIO: 20
CALCIUM: 10.1 mg/dL (ref 8.7–10.4)
CHLORIDE: 104 mmol/L (ref 98–107)
CO2: 28.3 mmol/L (ref 20.0–31.0)
CREATININE: 1 mg/dL
EGFR CKD-EPI (2021) FEMALE: 60 mL/min/{1.73_m2} (ref >=60)
GLUCOSE RANDOM: 106 mg/dL (ref 70–179)
POTASSIUM: 4.3 mmol/L (ref 3.4–4.8)
PROTEIN TOTAL: 8.2 g/dL (ref 5.7–8.2)
SODIUM: 138 mmol/L (ref 135–145)

## 2022-11-24 LAB — IRON PANEL
IRON SATURATION: 35 % (ref 20–55)
IRON: 100 ug/dL
TOTAL IRON BINDING CAPACITY: 282 ug/dL (ref 250–425)

## 2022-11-24 LAB — TSH: THYROID STIMULATING HORMONE: 0.916 u[IU]/mL (ref 0.550–4.780)

## 2022-11-24 LAB — FERRITIN: FERRITIN: 68.4 ng/mL

## 2022-11-24 MED ORDER — LISINOPRIL 5 MG TABLET
ORAL_TABLET | Freq: Every day | ORAL | 3 refills | 90 days | Status: CP
Start: 2022-11-24 — End: 2023-11-24

## 2022-11-24 NOTE — Unmapped (Addendum)
Good to see you today.     I am worried about the falls that you had. I strongly recommend that you continue doing the exercises with PT. Also, please be sure to sit up on the edge of the bed and pause before standing up at night.    With respect to medications:  It is OK to stop taking the pravastatin. You do not need it in addition to the Repatha.  Please replace amlodipine with lisinopril  5 mg daily for blood pressure. Lisinopril is good for people with diabetes and heart trouble. Please check your blood  pressure at home after making this change. The goal BP is 110-130. Please contact me to adjust the dose if you are not in that range.    See you again in 1 year.  Please feel free to contact me with any questions that arise before then.

## 2022-11-24 NOTE — Unmapped (Signed)
[facility]io-oncology Clinic Return Patient/Consult Note    Referring Provider:    Primary Provider: [name], MD   [address]  [place] [address]       Reason for Visit:  Amber Duncan is a 72 y.o. female who returns for ongoing management of CAD in the setting of treatment for breast cancer.    Assessment & Plan:  1. Atherosclerosis of native coronary artery of native heart without angina pectoris  She has undergone 2 PCI's to her RCA, most recently in the setting of stable angina in November 2020. At present she is asymptomatic. She completed 6 months of DAPT, adequate for zotarolimus-eluting stents such as hers, particularly given that she has symptomatic anemia due to GI bleed. She developed myalgias to multiple statins in the past, and became markedly hyperglycemic on rosuvastatin. Began taking 5 mg pravastatin (half of 10mg  tab) 07/2020, and denies any muscle pain. She started Repatha in 2021, and denies any issues administering the injection. She previously missed repatha for several months prior to her LDL on 07/15/21, but resumed shortly after. She now reports adherence. Recent LDL 74 (09/10/22), up from 34 (08/05/20).  --continue ASA 81 mg daily indefinitely despite continued low volume blood loss from AVMs  --continue Repatha   --STOP pravastatin 5 mg     2. Essential hypertension  Longstanding with questionsable control on current regimen. Previously on lisinopril 20 mg, attempted to add amlodipine 5 mg due to uncontrolled blood pressure (SBP 160s) following cardio-onc visit last year (09/16/2021) but there was a miscommunication and Ms. Fawaz discontinued the lisinopril when starting the amlodipine. Her home blood pressures are ~135/75-80 mmHg and she remains controlled in clinic today.    --STOP amlodipine 5 mg  --START lisinopril 5 mg PO daily to enhance cardiorenal protection     3. Malignant neoplasm of female breast, unspecified estrogen receptor status, unspecified laterality, unspecified site of breast (CMS-HCC)  Details of diagnosis and evaluation can be found below. Ongoing evaluation and management per Dr. Dellis Anes and the Oncology team.     4. Iron deficiency, likely etiology AVMs  Receiving chronic IV iron intermittently for several years, followed by Hematology. Last received late 2023.  --Continue ferrous sulfate PO   --CBC + iron profile today    5. Type 2 Diabetes Mellitus   Previously controlled on scheduled metformin 1000 mg BID, most recent A1c 6.1% (09/2022). Due to decreased appetite, she has recently been taking metformin PRN if morning blood sugar > 100. If it is lower than this, she omits the dose.   --Follow up with PCP to evaluate continued need for metformin     6. Weight Loss / Loss of Appetite   Has lost ~15 pounds since 09/2022 due to appetite loss. 90-pack year smoking history but remains abstinent (quit > 10 years ago). Chest CT completed 07/2022 without concern for lung cancer.   --CMP, lipase, CBC, TSH today    7. Falls  There is no indication that her falls were caused by syncope or have a cardiovascular etiology. I encouraged rising slowly from bed and ongoing engagement with PT-recommended exercise to improve strength and balance.    RTC in 6 months    History of Present Illness:  Amber Duncan who returns for ongoing management of CAD in the setting of treatment for breast cancer. Amber Duncan was diagnosed with CAD in 2008 and underwent PCI at that time. She had no further cardiac issues until Fall 2020 when  she developed exertional chest tightness that radiated down her left arm. She underwent a myocardial perfusion scan followed by a left heart catheterization, during which she received a PCI to the RCA (07/31/19). Thereafter her exertional symptoms resolved and have not recurred.     Today, Amber Duncan reports she has been losing weight since January. She reports that she has no appetite, and she eats one meal per day (~2 servings of oatmeal). Previously she ate 3 meals per day. Prior to breaking her arm in January, she notices feeling less hungry but was eating out of habit. She reports she has not has nauseous or diarrhea. She was taking oxycodone for ~2 weeks after her fall in January, but has been off of this for a few weeks. She does take her blood pressure at home, and her BP was 135/79 mmHg yesterday (evening home) and her HR is consistently in the 100s. She denies dizziness, but she reports she staggers back and forth often. She additionally denies chest pain and syncope/pre-syncope. She has experienced two falls recently (October 2023, January 2024). Both times, she was trying to get up from bed to go to the bathroom and both falls resulted in broken bones. She reports that even as recently as yesterday, she almost fell twice. She does endorse that she has osteoporosis, and her PCP stated they will start therapy for this. She is seeing neurology for dystonia, and has been prescribed levetiracetam and clonazepam, but she reports her symptoms are not improving. She denies dizziness/fatigue during the day, but it does help her to sleep at night. She previously used CPAP at night, but has been off of this for 5-6 months after it broke. She used this for ~10 years prior to this. She reports she notices a decrease in energy level since she has stopped using this at night. Her sleep study is 12/15/22, and she is hoping to re-start her nightly CPAP. She remains abstinent from smoking (quit > 10 years ago; h/o 90-pack years). She lives alone, but has family and friends that often check in on her (~2 times per week). 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    Cardiovascular History & Procedures:  Cath / PCI:  07/31/19  ?? Mid Cx to Dist Cx lesion is 50% stenosed.   ?? Prox RCA lesion is 40% stenosed.   ?? Dist RCA lesion is 90% stenosed.   ?? Previously placed Mid RCA to Dist RCA stent (unknown type) is widely  patent.   ?? A drug-eluting stent was successfully placed using a STENT RESOLUTE ONYX  2.75X15.   ?? Post intervention, there is a 0% residual stenosis.     1.  One-vessel CAD with patent stent mid to distal RCA with 90% stenosis   in distal native vessel just beyond the stent   2.  Normal left ventricular function   3.  Successful PCI with DES distal RCA    PCI to RCA 2008    CV Surgery:  None  EP Procedures and Devices:  None    Non-Invasive Evaluation(s):  Echo:  07/20/19  NORMAL LEFT VENTRICULAR SYSTOLIC FUNCTION   WITH MILD LVH   NORMAL RIGHT VENTRICULAR SYSTOLIC FUNCTION   MILD VALVULAR REGURGITATION (See above)   NO VALVULAR STENOSIS   TRIVIAL MR, TR   MILD PR   EF 50-55%     CT/MRI/Nuclear Tests:  Myocardial perfusion scan 07/20/19  1.  Negative ETT with nonspecific ST abnormalities   2.  Normal left ventricular function   3.  Normal wall motion   4.  Minimal to mild apical wall ischemia     Past Medical History:   Diagnosis Date    Breast cancer (CMS-HCC)     Chronic UTI     COPD (chronic obstructive pulmonary disease) (CMS-HCC)     Coronary artery disease     Diabetes (CMS-HCC)     Disease of thyroid gland     Eye trauma 06/17/2022    injury due to a fall on right eye    Fatigue     Hepatitis     Hepatitis     Hyperlipidemia     Hypertension     Migraine     Sleep apnea       Hematology/Oncology History Overview Note   2021: Left breast pT1b N0 IDC, G1, +/+/-     Malignant neoplasm of lower-inner quadrant of left breast in female, estrogen receptor positive (CMS-HCC)   10/24/2019 -  Presenting Symptoms    Abnormal screening MMG: asymmetry in the left breast     11/07/2019 Interval Scan(s)    MMG with tomo: Left breast LOQ small mass with associated distortion.  [place]: Left breast 7 x 4 x 6 mm irregular hypoechoic mass at the 5 o???clock 4 CFN position.     11/15/2019 Biopsy    Left breast stereotactic biopsy 5:00, 4CFN: IDC, G1, ER 95% positive, PR 80% positive. HER2- (1+) and associated DCIS.     12/26/2019 Initial Diagnosis    Malignant neoplasm of lower-inner quadrant of left breast in female, estrogen receptor positive (CMS-HCC)     12/27/2019 Tumor Board    MDC recs: Left breast cT1b N0 IDC, G1, +/+/-. 7 mm on imaging review, excellent candidate for BCT. Candidate to omit axillary staging. Possible candidate to omit radiation. Will see med/onc post op.      12/27/2019 -  Cancer Staged    Staging form: Breast, AJCC 8th Edition  - Clinical stage from 12/27/2019: Stage IA (cT1b, cN0, cM0, G1, ER+, PR+, HER2-) - Signed by Talbert Cage, DO on 12/27/2019       01/19/2020 Surgery    Left US-guided partial mastectomy performed by Dr. Dellis Anes. Final pathology showed IDC 7mm in greatest dimension with DCIS grade 2, all margins clear     01/29/2020 -  Cancer Staged    Staging form: Breast, AJCC 8th Edition  - Pathologic stage from 01/29/2020: Stage IA (pT1b, pN0, cM0, G1, ER+, PR+, HER2-) - Signed by Talbert Cage, DO on 02/06/2020           Past Surgical History:   Procedure Laterality Date    BACK SURGERY      BREAST BIOPSY Left 11/2019    positive    BREAST LUMPECTOMY Left 01/2020    CATARACT EXTRACTION      CHOLECYSTECTOMY      CORONARY STENT PLACEMENT      HYSTERECTOMY      KNEE SURGERY      PR COLONOSCOPY,ABLATE LESION N/A 01/21/2017    Procedure: COLONOSCOPY FLEX; W/ABLAT LES NOT AMENABLE-SNARE;  Surgeon: Luanne Bras, MD;  Location: GI PROCEDURES [facility];  Service: Gastroenterology    PR COLSC FLX W/RMVL OF TUMOR POLYP LESION SNARE TQ N/A 01/21/2017    Procedure: COLONOSCOPY FLEX; W/REMOV TUMOR/LES BY SNARE;  Surgeon: Luanne Bras, MD;  Location: [facility] PROCEDURES [facility];  Service: Gastroenterology  PR MASTECTOMY, PARTIAL Left 01/19/2020    Procedure: MASTECTOMY, PARTIAL (EG, LUMPECTOMY, TYLECTOMY, QUADRANTECTOMY, SEGMENTECTOMY);  Surgeon: Talbert Cage, DO;  Location: MAIN OR [facility];  Service: Surgical Oncology Breast    PR UPPER GI ENDOSCOPY,BIOPSY N/A 04/29/2018    Procedure: UGI ENDOSCOPY; WITH BIOPSY, SINGLE OR MULTIPLE;  Surgeon: Rona Ravens, MD;  [facility];  Service: Gastroenterology    RADIATION Left     REDUCTION MAMMAPLASTY Bilateral     just a lift    REPLACEMENT TOTAL KNEE Right      Allergies:  Patient has no known allergies.    Current Medications:  Current Outpatient Medications   Medication Sig Dispense Refill    amlodipine (NORVASC) 5 MG tablet Take 1 tablet (5 mg total) by mouth daily. 30 tablet 6    aspirin-calcium carbonate 81 mg-300 mg calcium(777 mg) Tab Take 81 mg by mouth.      cephalexin (KEFLEX) 250 MG capsule Take 1 capsule daily for UTI prevention 30 capsule 11    cholecalciferol, vitamin D3, (VITAMIN D3 ORAL) Take by mouth.      clonazePAM (KLONOPIN) 0.5 MG tablet Take 3 tabs per day as directed 90 tablet 2    docusate sodium (COLACE) 100 MG capsule Take 1 capsule (100 mg total) by mouth daily as needed.      empty container (SHARPS-A-GATOR DISPOSAL SYSTEM) Misc Use as directed for sharps disposal 1 each 2    evolocumab (REPATHA SURECLICK) 140 mg/mL PnIj Inject the contents of 1 pen (140 mg) under the skin every fourteen (14) days. 2 mL 11    ferrous gluconate 324 MG tablet Take 1 tablet (324 mg total) by mouth once.      FLUoxetine (PROZAC) 20 MG capsule Take 1 capsule (20 mg total) by mouth daily. 90 capsule 3    levETIRAcetam (KEPPRA) 1000 MG tablet TAKE 1 TABLET BY MOUTH 2 TIMES DAILY 180 tablet 3    levETIRAcetam (KEPPRA) 250 MG tablet Take 1 tablet (250 mg total) by mouth two (2) times a day. 180 tablet 3    metFORMIN (GLUCOPHAGE) 500 MG tablet Take 2 tablets (1,000 mg total) by mouth in the morning and 2 tablets (1,000 mg total) in the evening. Take with meals. 360 tablet 1    omeprazole 20 mg TbLD Take by mouth nightly as needed.      ONETOUCH ULTRA TEST Strp       oxyCODONE (ROXICODONE) 5 MG immediate release tablet Take 1 tablet (5 mg total) by mouth every eight (8) hours as needed for pain. (Patient not taking: Reported on 10/30/2022) 21 tablet 0    pravastatin (PRAVACHOL) 10 MG tablet Take 0.5 tablet once daily 45 tablet 1    SYNTHROID 100 mcg tablet Take 1 tablet (100 mcg total) by mouth daily. 90 tablet 3    traZODone (DESYREL) 50 MG tablet Take 1/2 to 1 tab at bedtime as directed 30 tablet 11    vitamin E acetate (VITAMIN E ORAL) Take by mouth.       No current facility-administered medications for this visit.     Family History:  There is no family history of premature coronary artery disease or sudden cardiac death.   Family History   Problem Relation Age of Onset    Other Mother         MVA    Other Father         MVA    No Known Problems Sister  Other Sister         MVA    Other Sister         MVA drunk driver    No Known Problems Daughter     No Known Problems Maternal Grandmother     No Known Problems Maternal Grandfather     No Known Problems Paternal Grandmother     No Known Problems Paternal Grandfather     Cancer Brother         liver    Cancer Brother         Lung    No Known Problems Brother     No Known Problems Son     Breast cancer Maternal Aunt     Cancer Maternal Aunt     Cancer Maternal Uncle     Diabetes Paternal Uncle     No Known Problems Other     Heart failure Neg Hx     BRCA 1/2 Neg Hx     Colon cancer Neg Hx     Endometrial cancer Neg Hx     Ovarian cancer Neg Hx     Glaucoma Neg Hx     Macular degeneration Neg Hx      Social history:  She worked Education officer, 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 She quit smoking roughly 8 years ago.  Social History     Tobacco Use    Smoking status: Former     Current packs/day: 0.00     Average packs/day: 2.0 packs/day for 48.0 years (96.0 ttl pk-yrs)     Types: Cigarettes     Start date: 11/06/1963     Quit date: 11/06/2011     Years since quitting: 11.0     Passive exposure: Past    Smokeless tobacco: Never   Vaping Use    Vaping status: Never Used   Substance Use Topics    Alcohol use: No    Drug use: No       Review of Systems:  A full review of 10 systems is unremarkable except as stated in the HPI.    Physical Exam:  VITAL SIGNS:   Vitals:    11/24/22 0814   BP: 136/92   Pulse: 78   SpO2: 97%   Recheck of BP 143/86 and HR 67.    Wt Readings from Last 3 Encounters:   11/24/22 79.3 kg (174 lb 12.8 oz)   10/30/22 82.6 kg (182 lb 3.2 oz)   10/01/22 86.2 kg (190 lb)      Today's Body mass index is 28.23 kg/m??.   I performed a physical exam (11/24/22). It is documented accurately below.  GENERAL: well-appearing in no acute distress  HEENT: Normocephalic and atraumatic with anicteric sclerae    NECK: Supple, without lymphadenopathy or thyromegaly. JVP flat. There are no carotid bruits  CARDIOVASCULAR: Regular S1S2 without audible murmur or gallop  RESPIRATORY: Clear to auscultation without wheezes or rales.   ABDOMEN: Soft, non-tender, non-distended with audible bowel sounds. There is no organomegaly or palpable pulsatile mass.   EXTREMITIES:  Lower extremities are warm and without edema. L wrist is braced  SKIN: No rashes, ecchymosis or petechiae.  NEURO: Alert, pleasant, and appropriate. Non-focal neuro exam. Able to stand without pushing off.    Pertinent Laboratory Studies:   Lab Results   Component Value Date    Creatinine Whole Blood, POC 1.2 (H) 03/12/2021    Creatinine Whole Blood, POC 0.8 07/10/2016    Creatinine 0.96 09/10/2022  Creatinine 1.05 (H) 10/13/2021    BUN 23 09/10/2022    BUN 16 10/13/2021    Potassium 4.1 09/10/2022    Potassium 4.5 10/13/2021    AST 23 09/10/2022    AST 11 10/13/2021    ALT 15 09/10/2022    ALT 7 (L) 10/13/2021    TSH 1.171 01/12/2022    Total Bilirubin 0.3 09/10/2022    Total Bilirubin 0.4 10/13/2021    INR 1.03 05/26/2018    INR 1.18 01/20/2017    WBC 6.9 08/11/2022    HGB 13.3 08/11/2022    Hemoglobin 12.1 10/11/2020    HCT 38.5 08/11/2022    Platelet 252 08/11/2022    Triglycerides 166 (H) 09/10/2022    HDL 59 09/10/2022    Non-HDL Cholesterol 107 09/10/2022    LDL Calculated 74 09/10/2022 Other pertinent records were reviewed.    Pertinent Test Results from Today:  None

## 2022-12-08 MED ORDER — LEVETIRACETAM 1,000 MG TABLET
ORAL_TABLET | 3 refills | 0 days
Start: 2022-12-08 — End: ?

## 2022-12-08 NOTE — Unmapped (Signed)
Refill request received from patient.      Medication Requested: Keppra 1000 mg  Last Office Visit: 09/30/2022   Next Office Visit: 02/15/2023  Last Prescriber: Dr Nadara Mode     Nurse refill requirements met? No  If not met, why: Medication not on active SOP    Sent to: Provider for signing  If sent to provider, which provider?: Dr Nadara Mode

## 2022-12-21 ENCOUNTER — Ambulatory Visit
Admission: RE | Admit: 2022-12-21 | Discharge: 2022-12-21 | Disposition: A | Discharge status: Home / Self Care | Payer: PPO | Source: Ambulatory Visit | Attending: Family | Admitting: Family

## 2022-12-21 DIAGNOSIS — Z87891 Personal history of nicotine dependence: Secondary | ICD-10-CM | POA: Insufficient documentation

## 2022-12-21 DIAGNOSIS — Z122 Encounter for screening for malignant neoplasm of respiratory organs: Secondary | ICD-10-CM | POA: Diagnosis present

## 2022-12-22 ENCOUNTER — Encounter: Admit: 2022-12-22 | Discharge: 2022-12-23 | Payer: PRIVATE HEALTH INSURANCE

## 2022-12-22 NOTE — Unmapped (Signed)
The [facility] has made a second and final attempt to reach this patient to refill the following medication:Repatha.      We have been unable to leave messages on the following phone numbers: [phone] and have sent a Mychart questionnaire..    Dates contacted: 12/18/22, 12/22/22  Last scheduled delivery: 10/01/22    The patient may be at risk of non-compliance with this medication. The patient should call the [facility] at [phone]  Option 4, then Option 5: Cardiology, Endocrinology to refill medication.    Moshe Salisbury   [facility] Specialty Technician

## 2022-12-22 NOTE — Unmapped (Signed)
[facility] Refill Coordination Note    Specialty Medication(s) to be Shipped:   General Specialty: Repatha    Other medication(s) to be shipped: No additional medications requested for fill at this time     Amber Duncan, DOB: 07-02-1951  Phone: [phone] (home) [phone] (work)      All above HIPAA information was verified with patient.     Was a Nurse, learning disability used for this call? No    Completed refill call assessment today to schedule patient's medication shipment from the [facility] [phone]).  All relevant notes have been reviewed.     Specialty medication(s) and dose(s) confirmed: Regimen is correct and unchanged.   Changes to medications: Myleka reports no changes at this time.  Changes to insurance: No  New side effects reported not previously addressed with a pharmacist or physician: None reported  Questions for the pharmacist: No    Confirmed patient received a Conservation officer, historic buildings and a Surveyor, mining with first shipment. The patient will receive a drug information handout for each medication shipped and additional FDA Medication Guides as required.       DISEASE/MEDICATION-SPECIFIC INFORMATION        For patients on injectable medications: Patient currently has 1 doses left.  Next injection is scheduled for 4/18.    SPECIALTY MEDICATION ADHERENCE     Medication Adherence    Patient reported X missed doses in the last month: 0  Specialty Medication: REPATHA SURECLICK 140 mg/mL              Were doses missed due to medication being on hold? No    REPATHA SURECLICK 140 mg/mL   : 2 days of medicine on hand       REFERRAL TO PHARMACIST     Referral to the pharmacist: Not needed      [facility]     Shipping address confirmed in Epic.       Delivery Scheduled: Yes, Expected medication delivery date: 4/18.     Medication will be delivered via Same Day Courier to the prescription address in Epic WAM.    Westley Gambles   [facility] Specialty Technician

## 2022-12-23 DIAGNOSIS — J849 Interstitial pulmonary disease, unspecified: Principal | ICD-10-CM

## 2022-12-24 ENCOUNTER — Other Ambulatory Visit: Payer: Self-pay | Admitting: Acute Care

## 2022-12-24 DIAGNOSIS — Z122 Encounter for screening for malignant neoplasm of respiratory organs: Secondary | ICD-10-CM

## 2022-12-24 DIAGNOSIS — Z87891 Personal history of nicotine dependence: Secondary | ICD-10-CM

## 2022-12-24 MED FILL — REPATHA SURECLICK 140 MG/ML SUBCUTANEOUS PEN INJECTOR: SUBCUTANEOUS | 84 days supply | Qty: 6 | Fill #3

## 2022-12-25 ENCOUNTER — Ambulatory Visit: Admit: 2022-12-25 | Discharge: 2022-12-25 | Payer: PRIVATE HEALTH INSURANCE

## 2022-12-25 DIAGNOSIS — J849 Interstitial pulmonary disease, unspecified: Principal | ICD-10-CM

## 2022-12-25 DIAGNOSIS — R918 Other nonspecific abnormal finding of lung field: Principal | ICD-10-CM

## 2022-12-25 DIAGNOSIS — J383 Other diseases of vocal cords: Principal | ICD-10-CM

## 2022-12-25 NOTE — Unmapped (Signed)
Reason for consult: I am seeing Amber Duncan for interstitial lung disease and advice on diagnostic testing and treatment options      HPI   Presenting hx: The patient is a 72 yo with Pmhx of tobacco abuse (45 years x 2 ppd, quit 2013), L breast cancer s/p lumpectomy and xrt,  who presented with a complaint of abnormal chest CT and DOE over the preceding several months. Patient states she has been in a cancer screening program for the last 5-6 years and they had been following lung nodules. This year, a nodule was enlarging and there was evidence of worsening fibrosis in the lung. Patient states she used to have periodic shortness of breath where she feels like she is suffocating and having chest tightness (squeezing around the diaphragm). Patient states she get short of breath when walking which was also made worse by leg weakness. Nothing seems to improve the breathing. Patient was diagnosed with cervical spine myoclonus and has related severe chest discomfort and voice changes. She is on Keppra and klonopin which doesn't seem to help. She coughs most days and is both non-productive and productive. Sputum is clear. No hemoptysis. Minimal LE edema but does get orthopnea. No noticeable weight changes. She first noticed issues with her breathing and chest tightness 7 years ago and has been getting progressively worse.   She denies any prior history of lung problems. She is a former smoker and smoked 2ppd x 45 years. Quit 2013. No vaping or e-cigarettes. No marijuana use. She is retired. 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 Lives in [place], [place] in a mobile home. No known water damage or mold. She has lived there for 7 years. No pets. No birds. She had breast cancer 2 years ago and is s/p lumpectomy and 20 rounds of radiation. No chemotherapy. She is in remission. She endorsed fatigue, pain, dysphagia, dry mouth, GERD. No organic antigen exposures.     Since last clinic visit, the patient reports she has been doing so-so . She has follow up with neuro soon for cervical spine myoclonus. She has lost 25 lbs with severe nausea. She had a fall in January and broke her arm.      PMH   Past Medical History:   Diagnosis Date    Breast cancer (CMS-HCC)     Chronic UTI     COPD (chronic obstructive pulmonary disease) (CMS-HCC)     Coronary artery disease     Diabetes (CMS-HCC)     Disease of thyroid gland     Eye trauma 06/17/2022    injury due to a fall on right eye    Fatigue     Hepatitis     Hepatitis     Hyperlipidemia     Hypertension     Migraine     Sleep apnea            FH   - Brother with lung cancer in remission  - Uncle with lung cancer   - No family history of lung fibrosis. Parents died young in a car wreck       Family History   Problem Relation Age of Onset    Other Mother         MVA    Other Father  MVA    No Known Problems Sister     Other Sister         MVA    Other Sister         MVA drunk driver    No Known Problems Daughter     No Known Problems Maternal Grandmother     No Known Problems Maternal Grandfather     No Known Problems Paternal Grandmother     No Known Problems Paternal Grandfather     Cancer Brother         liver    Cancer Brother         Lung    No Known Problems Brother     No Known Problems Son     Breast cancer Maternal Aunt     Cancer Maternal Aunt     Cancer Maternal Uncle     Diabetes Paternal Uncle     No Known Problems Other     Heart failure Neg Hx     BRCA 1/2 Neg Hx     Colon cancer Neg Hx     Endometrial cancer Neg Hx     Ovarian cancer Neg Hx     Glaucoma Neg Hx     Macular degeneration Neg Hx           Social History     Socioeconomic History    Marital status: Divorced   Tobacco Use    Smoking status: Former     Current packs/day: 0.00     Average packs/day: 2.0 packs/day for 48.0 years (96.0 ttl pk-yrs)     Types: Cigarettes     Start date: 11/06/1963     Quit date: 11/06/2011     Years since quitting: 11.1     Passive exposure: Past    Smokeless tobacco: Never   Vaping Use    Vaping status: Never Used   Substance and Sexual Activity    Alcohol use: No    Drug use: No    Sexual activity: Not Currently   Social History Narrative    She lives in [place]. 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 She denies alcohol and illicit drug use.       Social Determinants of Health     Financial Resource Strain: Medium Risk (11/19/2020)    Overall Financial Resource Strain (CARDIA)     Difficulty of Paying Living Expenses: Somewhat hard   Food Insecurity: Food Insecurity Present (11/19/2020)    Hunger Vital Sign     Worried About Running Out of Food in the Last Year: Sometimes true     Ran Out of Food in the Last Year: Never true   Transportation Needs: No Transportation Needs (06/17/2021)    PRAPARE - Therapist, art (Medical): No     Lack of Transportation (Non-Medical): No   Physical Activity: Inactive (09/09/2017)    Received from [facility]    Exercise Vital Sign     Days of Exercise per Week: 0 days     Minutes of Exercise per Session: 0 min   Stress: No Stress Concern Present (09/09/2017)    Received from [facility] - Occupational Stress Questionnaire     Feeling of Stress : Only a little   Social Connections: Somewhat Isolated (09/09/2017)    Received from [facility]    Social Connection and Isolation  Panel [NHANES]     Frequency of Communication with Friends and Family: More than three times a week     Frequency of Social Gatherings with Friends and Family: Once a week     Attends Religious Services: More than 4 times per year     Active Member of [employer] or Organizations: No     Attends Banker Meetings: Never     Marital Status: Divorced          MEDS (personally reviewed in EPIC, pertinent meds noted below)     PHYSICAL EXAM   BP 115/86 (BP Site: L Arm, BP Position: Sitting, BP Cuff Size: Medium)  - Pulse 85  - Temp 36.2 ??C (97.2 ??F) (Temporal)  - SpO2 97%       General Appearance:   Well-developed, well-nourished, comfortable, no distress, appears stated age.   Eyes:  PERRL, conjunctiva clear, EOM's intact. No drainage.   Ears:  Normal external ears   Nose: Nares normal, septum midline, mucosa normal, no drainage    or sinus tenderness, no polyps   Oropharynx: Moist mucus membranes without lesions or thrush.  Good dentition.  Posterior pharynx clear. Voice is shaky.    Neck:  Supple. Trachea midline. No masses.   Respiratory:   CTAB.  No crackles, wheezes, or rhonchi.  No accessory muscle use. Symmetric chest expansion.    Cardiovascular:  Regular rate and rhythm. S1 and S2 normal. No murmur, rub  or gallop.  No JVD.  No peripheral edema   Gastrointestinal:   Abdomen soft, non-tender, and non-distended   Musculoskeletal: No tenderness or deformity of the chest wall.  Joints normal without swelling.   No clubbing or cyanosis. Normal tone   Skin:  No rashes, lesions, skin changes.  No bruising or petechiae.       Neurologic/Psychiatric:  Alert and oriented x3. Appropriate affect. No focal neurological deficits.  Normal gait and motor function.       RESULTS     Labs (reviewed in EPIC, pertinent values noted below)     Autoimmune serologies/markers:     ANA, RF, CCP, Anti-myositis panel, ENA, CK, Aldolase, CRP, sed rate, ANCA    The following were within normal limits:  5/23: CRP, aldolase, RF, ENA, CCP  7/23: igG, aldolase, CK, ANCA    The following were abnormal:    5/23: myositis panel + U1 RNP), ANA 80 homog    PFTs (personally reviewed and interpreted)     Date: FVC (% Pred) FEV1 (% Pred) ratio TLC DLCO low sat dist   01/26/22 2.57 (94) 1.75 (83) no bd resp 68%  12.33 (64) 98% 373 m   08/10/22 2.61 (94) 1.85 (87) 71%  12.42 (65) 98% 214 m   12/24/21 2.80 (101) 2.08 (98) 74%  13.13 (69)                                                   I personally reviewed the following studies:    LDCT report OSH 12/21/22: stable bibasilar reticular opacities, stable pulm nodules, largest RML 7.9 mm    Chest CT 07/28/22: LUNGS, AIRWAYS, AND PLEURA: Unchanged basilar predominant groundglass diffuse bilateral peripheral reticulation and mild basilar interlobular septal thickening. No honeycombing or traction bronchiectasis. Post radiation fibrosis in the subpleural left upper lobe. Unchanged subcentimeter benign-appearing pulmonary  nodules. For reference a dominant 9 mm perifissural nodule along the right minor fissure (3:353) likely a intrafissural lymph node, a 4 mm nodule in the left upper lobe (3:277), a 5 mm nodule in the left lower lobe (3:513). No new or enlarging pulmonary nodule. Central airways are patent. No pleural effusion or pneumothorax.MEDIASTINUM AND LYMPH NODES: No enlarged intrathoracic lymph nodes. Small sliding type hiatal hernia.        Chest CT 01/13/22: c/w 06/14/17 9 x 9 x 5 mm right middle lobe perifissural nodule previously 7 x 6 x 3 mm. 7 mm nodule periphery left lower lobe image 204 series 3. Minimal groundglass opacities lung bases. Increased mild to moderate peripheral septal thickening and reticular opacities.        Assessment/Plan  Dyspnea  - likely multifactorial including anemia, cervical spine myoclonus, ILD, deconditioning  - PFTs are stable/improved so I don't think her lungs are the cause of any worsening      ILD (interstitial lung disease) (CMS-HCC)  The etiology of the patient's Interstitial Lung Disease is unknown.  Based upon the patient's history, exam, and diagnostic studies to date, I favor a diagnosis of Idiopathic interstitial pneumonia (IPF >NSIP> smoking related ILD- though slow progressing), an antisynthetase syndrome (+ SRM ab)      -reviewed lab tests -ck,aldolase, IgG, ANCA - wnl  - repeat PFTs show slight improvement in last year, particularly FVC  - referred to pulm rehab  - could consider other treatment such as antifibrotic (OFEV, pirfenidone) if she demonstrates progressive disease     Lung nodules  -  follow up with LDCT lung screening per IP recs with PCP, reviewed report above      Iron deficiency anemia  - getting ferritin and CBC checked per hematology    Weight loss  - etiology is unclear  - does not appear related to lungs  - encouraged follow up with PCP and oncology    PCP Proph:  - not indicated    Bone Health  - Will defer to PCP  - Last Dexa:    Vaccines  Immunization History   Administered Date(s) Administered Comments    COVID-19 VACC,MRNA,(PFIZER)(PF) 10/20/2019 Historical - Not administered in Epic     11/10/2019 Historical - Not administered in Epic     05/31/2020 [facility]    INFLUENZA INJ MDCK PF, QUAD,(FLUCELVAX)(51MO AND UP EGG FREE) 05/15/2021     INFLUENZA QUAD HIGH DOSE 40YRS+(FLUZONE) 06/02/2016      05/24/2018      04/27/2019     INFLUENZA TIV (TRI) 51MO+ W/ PRESERV (IM) 05/26/2013      06/10/2014     INFLUENZA TIV (TRI) PF (IM) 05/23/2011      05/30/2012     Influenza Virus Vaccine, unspecified formulation 06/18/2015      06/14/2017      05/17/2020     Influenza Whole 06/07/2012     PNEUMOCOCCAL POLYSACCHARIDE 23-VALENT 09/08/2007      02/21/2016      04/27/2019 Historical - Not administered in Epic    Pneumococcal Conjugate 13-Valent 09/10/2014      02/09/2018 Historical - Not administered in Epic    ZOSTAVAX - ZOSTER VACCINE, LIVE, SQ 04/27/2011         20 min was spent with the patient face to face and 10  min was spent reviewing chart/imaging.    Complex medical decision making was performed.

## 2022-12-25 NOTE — Unmapped (Signed)
I recommend receiving the flu vaccine in the fall.     Thank you for allowing me to be a part of your care. Please call the clinic with any questions.    Toy Eisemann MD   Pulmonary and Critical Care Medicine  [place]     Thank you for your visit to the [facility]. You may receive a survey from [facility] regarding your visit today, and we are eager to use this feedback to improve your experience and provide outstanding care. Thank you for taking the time to fill it out.    Between appointments, you can reach us at these numbers:    For clinical questions: ILD nurse coordinator Liesl Jeffreys [phone]  For appointments: [phone]  Fax: [phone]

## 2022-12-29 ENCOUNTER — Ambulatory Visit: Admit: 2022-12-29 | Discharge: 2022-12-30 | Payer: PRIVATE HEALTH INSURANCE

## 2022-12-29 ENCOUNTER — Ambulatory Visit: Admit: 2022-12-29 | Payer: PRIVATE HEALTH INSURANCE

## 2022-12-29 NOTE — Unmapped (Signed)
[facility] PT [facility]  OUTPATIENT PHYSICAL THERAPY  12/29/2022  Note Type: Discharge Note       Patient Name: Amber Duncan  Date of Birth:Apr 12, 1951  Diagnosis:   Encounter Diagnoses   Name Primary?    Trochanteric bursitis of both hips Yes    Muscle weakness     Difficulty walking      Referring MD:  Referring, Unknown Per *     Date of Onset of Impairment-No date available  Date PT Care Plan Established or Reviewed-No date available  Date PT Treatment Started-No date available   Plan of Care Effective Date:          Assessment/Plan:    Assessment  Assessment details:    Pt performed well in session today. This session served at the pt's discharge. Pt demonstrates that she has been very consistent with her HEP. She has a good understanding of the exs and has developed a good routine. She was educated on use of cane's for ambulation for prevention of falls. She ambulated more feet in 1 minute with improved balance using a cane. Pt also educated to maintain consistency with exs as she has been. At this time, pt is appropriate for discharge from PT as long as she stays consistent with her HEP. Pt prompted to reschedule if anything changes.             Impairments: decreased endurance, pain, decreased strength, decreased range of motion, impaired motor control, fall risk and dizziness/vertigo      Personal Factors/Comorbidities: 3+    Specific Comorbidities: active cancer, COPD, diabetes    Examination of Body Systems: musculoskeletal, activity/participation and communication    Clinical Presentation: evolving    Clinical Decision Making: moderate    Prognosis: good prognosis    Negative Prognosis Rationale: medical status/condition, chronicity of condition, severity of symptoms and strength.      Therapy Goals      Goals:        1. In 12 weeks the patient will demonstrate independent performance of HEP to maintain functional gains.   2. In 12 weeks the patient will demonstrate improved hip strength 4+/5 to indicate progression towards return to PLOF. (Goal MET able to do 20+ hip abductions consecutively on B sides)  3. In 12 weeks the patient will score improved 1 min walking speed distance  to indicate improved activity tolerance. (176ft with SINGLE POINT CANE)        Plan    Therapy options: will be seen for skilled physical therapy services    Planned therapy interventions: Aquatic Therapy, Location manager, Education - Patient, Endurance Activites, Functional Mobility, Gait Training, Home Exercise Program, Manual Therapy, Neuromuscular Re-education, Therapeutic Activities, Therapeutic Exercises, Education - Family/Caregiver, Dry Needling, E-Stim, TENS, Self-Care/Home Training, Civil engineer, contracting and Diaphragmatic/Pursed-lip Breathing    DME Equipment: [employer].    Frequency: 1x month    Duration in weeks: 12    Education provided to: patient.    Education provided: HEP, Treatment options and plan, Symptom management, Safety education, Importance of Therapy, Anatomy, Body mechanics, Role of therapy in Rehabilitation, Posture, Community resources, Body awareness and Fluid intake    Education results: verbalized good understanding, demonstrates understanding and needs reinforcement.    Communication/Consultation: N/A.              Subjective:   History of Present Condition          Date of Onset:  11/16/2016  Subjective:  Pt is wrist sore and and rib sore. She reports that she fell again about a week ago. She reports only minor bruising.   She reports that she feels his hips are stronger.   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        Objective:   Objective      Posture/Observations:   Sitting:increased kyphosis, forward head posture  Standing: increased kyphosis    Gait Assessment:  Improvement in gait speed and steadiness of gait with single point cane     Range of Motion/Flexibilty:     WFL        Functional Test/Outcome Measures:  Squat: slow, use of L hand for assistance     Today: 12ft in 1 minute with use of cane     Evaluation 3/5/ :Pt amb 69ft in 1 minute    Treatment Rendered:    Self Care :10 minutes    Therapeutic Exercise: 35 minutes  Patient was educated about proper performance of HEP exercises and was observed performing them correctly.  Patient was given a written handout of exercises for the HEP.  Exercises included:     -nustep 5 minutes   -brides 1x10, set 2 with orange band   -SL hip abd 2x15  -rhomberg and tandem balance at table top for balance       Access Code: [email or web address]  URL: [email or web address]  Date: 12/29/2022  Prepared by: Consuello Closs    Exercises  - Bridge  - 1 x daily - 7 x weekly - 3 sets - 10 reps  - Sidelying Hip Abduction  - 1 x daily - 7 x weekly - 3 sets - 8 reps  - Sit to Stand with Counter Support  - 1 x daily - 7 x weekly - 3 sets - 10 reps  - Standing Romberg to 3/4 Tandem Stance  - 1 x daily - 7 x weekly - 3 sets - 10 reps      Total Time: 35 minutes      Patient Education:  Throughout evaluation patient educated regarding the following: Role of PT in Rehabilitation, HEP, importance of therapy, posture, body mechanics, body awareness, treatment plan, and Indications/Contraindications to Exercises. Patient demonstrated and verbalized agreement and understanding.                        Equipment provided/recommended:   written HEP                                      ADLs/IADLs Charges  $$ PT Self Care/Home Management Training [mins]: 10  Therapeutic Interventions Charges  $$ Therapeutic Exercise [mins]: 35                 I attest that I have reviewed the above information.  Signed: Westley Hummer, PT  12/29/2022 1:08 PM

## 2022-12-29 NOTE — Unmapped (Signed)
ORTHOPAEDIC NOTE     Amber Duncan L. Jalyne Brodzinski, PA-C        Amber Duncan    MRN: [id number]  DOB: August 27, 1951    Date of visit: 12/29/2022    Clinic location: [place]     ASSESSMENT:     Healing left distal radius, ulnar styloid and triquetral fracture sustained on 10/05/2022     PLAN:     She will wean out of her wrist brace and slowly resume all normal activities  I have recommended working aggressively on range of motion exercises of the wrist  I offered therapy and she declined  -Advised OTC analgesic PRN pain  -Discussed treatment options and patient was amenable to the above plan and was instructed to call and be seen if there is any increasing pain or concerns.     Follow up: As needed       Chief Complaint:     Recheck left wrist     SUBJECTIVE:     HPI: Amber Duncan is a RHD 72 y.o. with a PMHx DM, COPD, CAD, HLD, HTN, OSA presenting to Clinic for reevaluation of left intra-articular distal radius fracture with ulnar styloid fracture and triquetral fracture sustained and reduced on 10/05/2022.  She is weaned out of her removable wrist brace and has been working on range of motion and strengthening exercises.  Overall she is pleased with her progress but still notes some swelling in the wrist.  She continues to wear her wrist brace when she drives.       Allergies  No Known Allergies  Past Medical History  Past Medical History:   Diagnosis Date    Breast cancer (CMS-HCC)     Chronic UTI     COPD (chronic obstructive pulmonary disease) (CMS-HCC)     Coronary artery disease     Diabetes (CMS-HCC)     Disease of thyroid gland     Eye trauma 06/17/2022    injury due to a fall on right eye    Fatigue     Hepatitis     Hepatitis     Hyperlipidemia     Hypertension     Migraine     Sleep apnea         PHYSICAL EXAM:     MSK: Left wrist  Inspection: Mild swelling along the distal radius and ulna  Palpation: No tenderness on the distal radius or ulna  ROM: Wrist extension 40 wrist flexion 50  Strength: Intact pincer grasp strength  normal sensation LUE  radial pulses easily palpable     Imaging   Four views of the left Wrist independently reviewed and interpreted by myself show increasing sclerosis along the distal radius fracture with ulnar styloid fracture and triquetral fractures. No other obvious fractures, lucencies, dislocations, or acute abnormalities.    MEDICAL DECISION MAKING (level of service defined by 2/3 elements)     Number/Complexity of Problems Addressed 1 acute, uncomplicated illness or injury (99203/99213)   Amount/Complexity of Data to be Reviewed/Analyzed Independent interpretation of a test performed by another physician/other qualified health care professional (99204/99214)   Risk of Complications/Morbidity/Mortality of Management Over-the-counter Medications (99203/99213)   DME ORDER:  Dx:  ,                   cc:  [name], MD  *Patient note was created using Dragon Dictation sotware. Errors in syntax or grammar may not have been identified and edited on initial review.

## 2022-12-31 ENCOUNTER — Ambulatory Visit: Admit: 2022-12-31 | Discharge: 2023-01-01 | Payer: PRIVATE HEALTH INSURANCE

## 2022-12-31 DIAGNOSIS — F32A Depression, unspecified depression type: Principal | ICD-10-CM

## 2022-12-31 MED ORDER — FLUOXETINE 20 MG CAPSULE
ORAL_CAPSULE | Freq: Every day | ORAL | 1 refills | 90 days | Status: CP
Start: 2022-12-31 — End: 2023-12-31

## 2022-12-31 NOTE — Unmapped (Signed)
Patient is requesting the following refill  Requested Prescriptions     Pending Prescriptions Disp Refills    FLUoxetine (PROZAC) 20 MG capsule [Pharmacy Med Name: FLUOXETINE HCL 20 MG CAP] 90 capsule 1     Sig: Take 1 capsule (20 mg total) by mouth daily.       Recent Visits  Date Type Provider Dept   09/10/22 Office Visit [name], MD [facility] [address] [facility]   04/28/22 Office Visit [name], MD [facility] [address] [facility]   01/12/22 Office Visit [name], MD [facility] [address] [facility]   Showing recent visits within past 365 days with a meds authorizing provider and meeting all other requirements  Future Appointments  Date Type Provider Dept   01/28/23 Appointment [name], MD [facility] [address] At [facility]   Showing future appointments within next 365 days with a meds authorizing provider and meeting all other requirements       Labs: GAD7:   GAD7 Total Score GAD-7 Total Score   07/15/2021   7:00 AM 5      PHQ9:   PHQ-9 PHQ-9 TOTAL SCORE   04/28/2022   8:00 AM 4

## 2023-01-12 ENCOUNTER — Ambulatory Visit
Admit: 2023-01-12 | Discharge: 2023-01-13 | Payer: PRIVATE HEALTH INSURANCE | Attending: Nurse Practitioner | Primary: Nurse Practitioner

## 2023-01-12 DIAGNOSIS — Z8639 Personal history of other endocrine, nutritional and metabolic disease: Principal | ICD-10-CM

## 2023-01-12 DIAGNOSIS — D7589 Other specified diseases of blood and blood-forming organs: Principal | ICD-10-CM

## 2023-01-12 LAB — CBC W/ AUTO DIFF
BASOPHILS ABSOLUTE COUNT: 0.1 10*9/L (ref 0.0–0.1)
BASOPHILS RELATIVE PERCENT: 1.3 %
EOSINOPHILS ABSOLUTE COUNT: 0.2 10*9/L (ref 0.0–0.5)
EOSINOPHILS RELATIVE PERCENT: 4 %
HEMATOCRIT: 38.5 % (ref 34.0–44.0)
HEMOGLOBIN: 13.3 g/dL (ref 11.3–14.9)
LYMPHOCYTES ABSOLUTE COUNT: 2.1 10*9/L (ref 1.1–3.6)
LYMPHOCYTES RELATIVE PERCENT: 36.5 %
MEAN CORPUSCULAR HEMOGLOBIN CONC: 34.7 g/dL (ref 32.0–36.0)
MEAN CORPUSCULAR HEMOGLOBIN: 32.7 pg — ABNORMAL HIGH (ref 25.9–32.4)
MEAN CORPUSCULAR VOLUME: 94.3 fL (ref 77.6–95.7)
MEAN PLATELET VOLUME: 7.7 fL (ref 6.8–10.7)
MONOCYTES ABSOLUTE COUNT: 0.5 10*9/L (ref 0.3–0.8)
MONOCYTES RELATIVE PERCENT: 9 %
NEUTROPHILS ABSOLUTE COUNT: 2.8 10*9/L (ref 1.8–7.8)
NEUTROPHILS RELATIVE PERCENT: 49.2 %
PLATELET COUNT: 260 10*9/L (ref 150–450)
RED BLOOD CELL COUNT: 4.08 10*12/L (ref 3.95–5.13)
RED CELL DISTRIBUTION WIDTH: 12.6 % (ref 12.2–15.2)
WBC ADJUSTED: 5.8 10*9/L (ref 3.6–11.2)

## 2023-01-12 LAB — IRON PANEL
IRON SATURATION: 38 % (ref 20–55)
IRON: 101 ug/dL
TOTAL IRON BINDING CAPACITY: 265 ug/dL (ref 250–425)

## 2023-01-12 LAB — FERRITIN: FERRITIN: 69.9 ng/mL

## 2023-01-12 LAB — C-REACTIVE PROTEIN: C-REACTIVE PROTEIN: <4 mg/L (ref <=10.0)

## 2023-01-12 LAB — VITAMIN B12: VITAMIN B-12: 304 pg/mL (ref 211–911)

## 2023-01-12 LAB — RETICULOCYTES
RETICULOCYTE ABSOLUTE COUNT: 33.6 10*9/L (ref 23.0–100.0)
RETICULOCYTE COUNT PCT: 0.82 % (ref 0.50–2.17)

## 2023-01-12 LAB — SEDIMENTATION RATE: ERYTHROCYTE SEDIMENTATION RATE: 29 mm/h (ref 0–30)

## 2023-01-12 LAB — MONOCLONAL GAMMOPATHY CHEMISTRIES
GAMMAGLOBULIN; IGA: 272 mg/dL (ref 70.0–400.0)
GAMMAGLOBULIN; IGG: 1419 mg/dL (ref 650–1600)
GAMMAGLOBULIN; IGM: 148 mg/dL (ref 40–230)
PROTEIN TOTAL (SPECIAL CHEM): 7.8 g/dL (ref 5.7–8.2)

## 2023-01-12 LAB — LACTATE DEHYDROGENASE: LACTATE DEHYDROGENASE: 141 U/L (ref 120–246)

## 2023-01-12 LAB — FOLATE: FOLATE: 20.2 ng/mL (ref >=5.4)

## 2023-01-12 NOTE — Unmapped (Signed)
Hematology Consult Note    Primary Care Physician:   [name], MD - [facility] [place]    Reason for Visit: Anemia    Assessment/Recommendations:  ADLENE Amber Duncan is a 72 y.o. Caucasian female with a history of iron deficiency anemia, dysphonia spastica, DMT2 c/b gastroparesis, GERD, COPD, fatigue, hypothyroidism, HTN, CAD, chronic UTI, hyperlipidemia, and L breast cancer, ILD who we are seeing in follow up    1. Macrocytosis - Iron deficiency:  First noted to be anemic in 2015; she has received IV iron at [facility] since 2017. Most likely cause of iron deficiency is occult blood loss from AVMs and possibly GERD. She last received 2 infusions of Ferrlecit (500 mg total dose), in 02/2022, and takes PO iron every other day. Labs from August 2023 showed good response to IV iron. She is established with [facility] and while they recognize she likely has occult GIB, currently the risks of endoscopy and tx of recurrent AVMs outweigh the benefits, therefore we will continue with PRN Ferrlecit infusions.    Symptomatically, she hasn't felt well in over a year. Symptoms include fatigue, black tarry loose stools, decreased appetite with an unintentional 25-30 lb weight loss over the past 3 months, abd pain. Over the past 2 years mild macrocytosis has occurred, so will further work that up with nutritional labs, MMP, monoclonal gammopathy work up, and abdominal US. Will also repeat serum celiac testing. Labs today show Hgb 13.3, MCV 94.3, retics normal, iron sat 38%, ferritin 70, B-12 304, folate 20.2, CRP <4, Sed rate 29, LDH 141. There is no e/o monoclonal protein on IFE, and Ttg IGA was negative. Myeloid mutation panel and abdominal US results pending.    There is no need for additional IV iron at this time. She should continue taking PO iron as tolerated. Continue B-12 supplements as well. Will follow up results of the myeloid mutation panel and the abdominal US.  RTC in 4 months for follow up labs. All questions were answered and the patient was in agreement with this plan.    2. Vitamin B-12 deficiency: On 10/13/21 Vitamin B-12 274. PCP recommended she start PO B-12 supplements daily. Repeat B-12 level in Aug was >400.She should continue on B-12 supplements.     Plan:  - Labs collected today: CBC, Retic, Iron, Iron Sat, TIBC, Ferritin, Sed Rate, CRP, B12, Folate, and myeloma work-up, Ttg IgA, LDH, myeloid mutation panel  - Ordered abdominal US to further evaluate macrocytosis, RUQ pain  - No need for additional IV iron at this time  - Continue OTC PO iron supplements as tolerated. Take 1 tab every other day  - Continue PO B-12 supplements   - Return in 4 months for repeat labs and assessment.    History of Present Illness:   Available labs date back to 2013. First noted to be anemic 2015, with intermittent anemia since that time with Hgb 9-14 (generally normocytic). Since 2017 she has been receiving Venofer injections intermittently via [facility] and taking PO iron daily. She has some constipation requiring suppositories, but otherwise tolerates the iron ok.    She was seen by [facility] in 2017. Capsule endoscopy showed AVM in small bowel with subsequent EGD and tx of that AVM. In 10/2019 she had an episode of GIB with Hgb 7.5. Colonoscopy and EGD at that time again showed bleeding AVMs, thought to be exacerbated by anticoagulation after cardiac stent placement. Multiple AVMs were cauterized, she received 1 unit  PRBCs and IV iron infusions. Has had intermittent iron deficiency anemia since that time (Hgb 9-12). Most recent labs on 05/15/21 show Hgb 12.1, MCV 96.5, plt 288, WBC 7, ferritin 28.7, iron 85, iron sat 24. Sed rate, CRP n/a. B12 and folate have been normal historically. She is no longer on anticoagulation, does take 1 baby ASA daily.     She is post-menopausal, had a hysterectomy at 77, has had no vaginal bleeding since that time.She eats a varied diet that includes: chicken or tuna 3-5x/week, beans, leafy green vegetables. Does not have s/sx of celiac disease or inflammatory bowel disease, and does not have a hx of bariatric surgery. She is not a long distance runner or regular blood donor. She does have Hx of hepatitis, reportedly treated.    She reports ongoing acid reflux and mucousy yellow stools which she associates with chronic abx for UTI. When bowels do move they are dark, which she associates with the iron. Otherwise denies known GI bleeding (melena, hematochezia).  She denies history of bleeding complications with prior surgery, excessive mucosal bleeding or abnormal bruising.  Her energy level and concentration has been poor.  She denies dietary restrictions. She admits hair loss or brittle nails. She admits pica (ice, pretzels, popcorn). She admits shortness of breath with exertion or at rest, dizziness, syncope, chest pain. She denies RLS.    Received 500 mg Ferrlecit May/June 2023 and tolerated it well. She is taking PO iron daily. Does have some cramping, fecal urgency, constipation. Reports occasional black BM, occasionally bloody which she thinks is from hemorrhoids. She saw GI in June, who suspect there is occult GIB r/t AVM, risks of endoscopy outweight benefits though.     She continues to have fatigue and feels poorly overall, weak legged.. Didn't have much improvement in symptoms when attempted to keep ferritin >100. She was recently diagnosed with ILD and is following closely with Pulm.     Interval Hx:  Continues to feel poorly with fatigue, loose stools (oily/greasy, black, occ rust colored) weakness. She reports decreased appetite and she's lost 25-30 lbs in the past 3 months. She has been unable to tolerate solid food. She has intermittent abdominal tenderness.    Visit Diagnoses:  1. Macrocytosis without anemia    2. History of iron deficiency        Medical History:  Past Medical History:   Diagnosis Date    Breast cancer (CMS-HCC)     Chronic UTI     COPD (chronic obstructive pulmonary disease) (CMS-HCC)     Coronary artery disease     Diabetes (CMS-HCC)     Disease of thyroid gland     Eye trauma 06/17/2022    injury due to a fall on right eye    Fatigue     Hepatitis     Hepatitis     Hyperlipidemia     Hypertension     Migraine     Sleep apnea        Surgical History:  Past Surgical History:   Procedure Laterality Date    BACK SURGERY      BREAST BIOPSY Left 11/2019    positive    BREAST LUMPECTOMY Left 01/2020    CATARACT EXTRACTION      CHOLECYSTECTOMY      CORONARY STENT PLACEMENT      HYSTERECTOMY      KNEE SURGERY      PR COLONOSCOPY,ABLATE LESION N/A 01/21/2017    Procedure: COLONOSCOPY FLEX; Hayes Ludwig  LES NOT AMENABLE-SNARE;  Surgeon: Luanne Bras, MD;  [facility];  Service: Gastroenterology    PR COLSC FLX W/RMVL OF TUMOR POLYP LESION SNARE TQ N/A 01/21/2017    Procedure: COLONOSCOPY FLEX; W/REMOV TUMOR/LES BY SNARE;  Surgeon: Luanne Bras, MD;  Location: [facility];  Service: Gastroenterology    PR MASTECTOMY, PARTIAL Left 01/19/2020    Procedure: MASTECTOMY, PARTIAL (EG, LUMPECTOMY, TYLECTOMY, QUADRANTECTOMY, SEGMENTECTOMY);  Surgeon: Talbert Cage, DO;  Location: MAIN OR [facility];  Service: Surgical Oncology Breast    PR UPPER GI ENDOSCOPY,BIOPSY N/A 04/29/2018    Procedure: UGI ENDOSCOPY; WITH BIOPSY, SINGLE OR MULTIPLE;  Surgeon: Rona Ravens, MD;  [facility];  Service: Gastroenterology    RADIATION Left     REDUCTION MAMMAPLASTY Bilateral     just a lift    REPLACEMENT TOTAL KNEE Right        No history of bleeding complications with past surgeries    Social History:  Social History     Socioeconomic History    Marital status: Divorced     Spouse name: None    Number of children: None    Years of education: None    Highest education level: None   Tobacco Use    Smoking status: Former     Current packs/day: 0.00     Average packs/day: 2.0 packs/day for 48.0 years (96.0 ttl pk-yrs) Types: Cigarettes     Start date: 11/06/1963     Quit date: 11/06/2011     Years since quitting: 11.1     Passive exposure: Past    Smokeless tobacco: Never   Vaping Use    Vaping status: Never Used   Substance and Sexual Activity    Alcohol use: No    Drug use: No    Sexual activity: Not Currently   Social History Narrative    She lives in [place]. 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 She denies alcohol and illicit drug use.       Social Determinants of Health     Financial Resource Strain: Medium Risk (11/19/2020)    Overall Financial Resource Strain (CARDIA)     Difficulty of Paying Living Expenses: Somewhat hard   Food Insecurity: Food Insecurity Present (11/19/2020)    Hunger Vital Sign     Worried About Running Out of Food in the Last Year: Sometimes true     Ran Out of Food in the Last Year: Never true   Transportation Needs: No Transportation Needs (06/17/2021)    PRAPARE - Therapist, art (Medical): No     Lack of Transportation (Non-Medical): No   Physical Activity: Inactive (09/09/2017)    Received from [facility]    Exercise Vital Sign     Days of Exercise per Week: 0 days     Minutes of Exercise per Session: 0 min   Stress: No Stress Concern Present (09/09/2017)    Received from [facility] - Occupational Stress Questionnaire     Feeling of Stress : Only a little   Social Connections: Somewhat Isolated (09/09/2017)    Received from [facility]    Social Connection and Isolation Panel [NHANES]     Frequency of Communication with Friends and Family: More than three times a week     Frequency of Social Gatherings with Friends  and Family: Once a week     Attends Religious Services: More than 4 times per year     Active Member of Clubs or Organizations: No     Attends Banker Meetings: Never     Marital Status: Divorced       SHAE SHEVCHENKO was born in [place] and currently lives in [place], alone. Retired, worked as a Copy at [employer], and in Designer, fashion/clothing. Still occasionally cleans houses. Hobbies include seeing live music, going to the beach, going out to eat.     Tobacco history: Former, quit 9 years ago. Smoked for 45 years prior to that.  Alcohol history: Denies  Illicit drug history: Denies    Family History:  family history includes Breast cancer in her maternal aunt; Cancer in her brother, brother, maternal aunt, and maternal uncle; Diabetes in her paternal uncle; No Known Problems in her brother, daughter, maternal grandfather, maternal grandmother, paternal grandfather, paternal grandmother, sister, son, and another family member; Other in her father, mother, sister, and sister.     Sister with iron deficiency anemia. No family hx of bleeding or clotting disorders    Allergies:  Patient has no known allergies.    Medications:   Current Outpatient Medications   Medication Sig Dispense Refill    aspirin (ECOTRIN) 81 MG tablet Take 1 tablet (81 mg total) by mouth daily.      cephalexin (KEFLEX) 250 MG capsule Take 1 capsule daily for UTI prevention 30 capsule 11    cholecalciferol, vitamin D3, (VITAMIN D3 ORAL) Take by mouth.      clonazePAM (KLONOPIN) 0.5 MG tablet Take 3 tabs per day as directed 90 tablet 2    evolocumab (REPATHA SURECLICK) 140 mg/mL PnIj Inject the contents of 1 pen (140 mg) under the skin every fourteen (14) days. 2 mL 11    ferrous gluconate 324 MG tablet Take 1 tablet (324 mg total) by mouth once.      FLUoxetine (PROZAC) 20 MG capsule Take 1 capsule (20 mg total) by mouth daily. 90 capsule 1    levETIRAcetam (KEPPRA) 1000 MG tablet TAKE 1 TABLET BY MOUTH 2 TIMES DAILY 180 tablet 3    levETIRAcetam (KEPPRA) 250 MG tablet Take 1 tablet (250 mg total) by mouth two (2) times a day. 180 tablet 3    lisinopril (PRINIVIL,ZESTRIL) 5 MG tablet Take 1 tablet (5 mg total) by mouth daily. 90 tablet 3    omeprazole 20 mg TbLD Take by mouth nightly as needed. SYNTHROID 100 mcg tablet Take 1 tablet (100 mcg total) by mouth daily. 90 tablet 3    traZODone (DESYREL) 50 MG tablet Take 1/2 to 1 tab at bedtime as directed 30 tablet 11    docusate sodium (COLACE) 100 MG capsule Take 1 capsule (100 mg total) by mouth daily as needed. (Patient not taking: Reported on 01/12/2023)      empty container (SHARPS-A-GATOR DISPOSAL SYSTEM) Misc Use as directed for sharps disposal 1 each 2    metFORMIN (GLUCOPHAGE) 500 MG tablet Take 2 tablets (1,000 mg total) by mouth in the morning and 2 tablets (1,000 mg total) in the evening. Take with meals. (Patient not taking: Reported on 01/12/2023) 360 tablet 1    ONETOUCH ULTRA TEST Strp       vitamin E acetate (VITAMIN E ORAL) Take by mouth.       No current facility-administered medications for this visit.       Review of Systems:  As per  HPI, otherwise negative x 12 systems.    Objective :  Vitals:    01/12/23 0821   BP: 130/90   BP Site: L Arm   BP Position: Sitting   Pulse: 94   Temp: 36.3 ??C (97.3 ??F)   TempSrc: Temporal   SpO2: 97%   Weight: 75.2 kg (165 lb 12.8 oz)   Height: 167.6 cm (5' 5.98)         Physical Exam:  GEN: Chronically ill-appearing female in no acute distress.  HEENT: Normocephalic, no conjunctival injection, sclera anicteric. No cervical or supraclavicular LAD  NEURO: A&Ox3, CNII-XII grossly intact.  PSY: Appropriate affect, reasonable insight and judgment.  ABD: Soft abdomen, with tenderness to palpation RUQ and gastric region     Test Results  Lab Results   Component Value Date    WBC 5.8 01/12/2023    HGB 13.3 01/12/2023    HCT 38.5 01/12/2023    PLT 260 01/12/2023          Lab Results   Component Value Date    IRON 100 11/24/2022    TIBC 282 11/24/2022    FERRITIN 68.4 11/24/2022        Results for orders placed or performed in visit on 01/12/23   Reticulocytes   Result Value Ref Range    Reticulocyte Auto % 0.82 0.50 - 2.17 %    Absolute Auto Reticulocyte 33.6 23.0 - 100.0 10*9/L   CBC w/ Differential   Result Value Ref Range    WBC 5.8 3.6 - 11.2 10*9/L    RBC 4.08 3.95 - 5.13 10*12/L    HGB 13.3 11.3 - 14.9 g/dL    HCT 16.1 09.6 - 04.5 %    MCV 94.3 77.6 - 95.7 fL    MCH 32.7 (H) 25.9 - 32.4 pg    MCHC 34.7 32.0 - 36.0 g/dL    RDW 40.9 81.1 - 91.4 %    MPV 7.7 6.8 - 10.7 fL    Platelet 260 150 - 450 10*9/L    Neutrophils % 49.2 %    Lymphocytes % 36.5 %    Monocytes % 9.0 %    Eosinophils % 4.0 %    Basophils % 1.3 %    Absolute Neutrophils 2.8 1.8 - 7.8 10*9/L    Absolute Lymphocytes 2.1 1.1 - 3.6 10*9/L    Absolute Monocytes 0.5 0.3 - 0.8 10*9/L    Absolute Eosinophils 0.2 0.0 - 0.5 10*9/L    Absolute Basophils 0.1 0.0 - 0.1 10*9/L       Glendell Docker, NP  [facility]ematology

## 2023-01-12 NOTE — Unmapped (Addendum)
I have ordered an abdominal ultrasound. If you do not hear from the ultrasound team by Friday, please call [phone] to schedule it.    Please follow up with GI about your loss of appetite, weight loss, loose/oily stools. You can call them at [phone] to schedule an appt. You last saw Dr. Beverly Milch in June 2023.

## 2023-01-13 LAB — MONOCLONAL GAMMOPATHY WORKUP, SERUM
ALBUMIN (SPE): 4.3 g/dL (ref 3.5–5.0)
ALPHA-1 GLOBULIN: 0.3 g/dL (ref 0.2–0.5)
ALPHA-2 GLOBULIN: 0.9 g/dL (ref 0.5–1.1)
BETA-1 GLOBULIN: 0.4 g/dL (ref 0.3–0.6)
BETA-2 GLOBULIN: 0.5 g/dL (ref 0.2–0.6)
GAMMAGLOBULIN: 1.4 g/dL (ref 0.5–1.5)
PROTEIN TOTAL (SPECIAL CHEM): 7.8 g/dL

## 2023-01-13 LAB — SERUM FREE LIGHT CHAINS
K/L FLC RATIO: 2.2 — ABNORMAL HIGH (ref 0.26–1.65)
KAPPA FREE,SERUM: 4.2 mg/dL — ABNORMAL HIGH (ref 0.33–1.94)
LAMBDA FREE, SER: 1.91 mg/dL (ref 0.57–2.63)

## 2023-01-14 LAB — TISSUE TRANSGLUTAMINASE (TTG), IGA
TISSUE TRANSGLUTAMINASE ANTIBODY, IGA: 0.6 U/mL (ref <7)
TTG INTERPRETATION: NEGATIVE

## 2023-01-14 NOTE — Unmapped (Signed)
Abstraction Result Flowsheet Data    This patient's last AWV date: [place] Last Medicare Wellness Visit Date: Not Found  This patients last WCC/CPE date: : Not Found      Reason for Encounter  Reason for Encounter: Outreach  Primary Reason for Outreach: AWV  Text Message: No  MyChart Message: No  Outreach Call Outcome: Left message

## 2023-01-25 ENCOUNTER — Ambulatory Visit: Admit: 2023-01-25 | Discharge: 2023-01-26 | Payer: PRIVATE HEALTH INSURANCE

## 2023-01-25 NOTE — Unmapped (Signed)
Assessment/Plan:    Amber Duncan was seen today for follow-up.    Diagnoses and all orders for this visit:    Abdominal pain, unspecified abdominal location  -     Ambulatory referral to Gastroenterology; Future  -     CT abdomen pelvis with contrast; Future    Chronic diarrhea  -     Ambulatory referral to Gastroenterology; Future  -     CT abdomen pelvis with contrast; Future  -     Celiac Panel; Future  -     Celiac Panel    Weight loss  -     Ambulatory referral to Gastroenterology; Future  -     CT abdomen pelvis with contrast; Future    AVM (arteriovenous malformation) of colon  -     Ambulatory referral to Gastroenterology; Future  -     CT abdomen pelvis with contrast; Future    Hypothyroidism, unspecified type    Hx of iron deficiency anemia    Essential hypertension    Dysphonia spastica    Mixed hyperlipidemia    Malignant neoplasm of lower-inner quadrant of left breast in female, estrogen receptor positive (CMS-HCC)    Type 2 diabetes with complication (CMS-HCC)  -     POCT Glucose  -     Hemoglobin A1c    Depression, unspecified depression type  -     FLUoxetine (PROZAC) 40 MG capsule; Take 1 capsule (40 mg total) by mouth daily.    Lung nodules    Microscopic hematuria  -     Urinalysis with Microscopy  -     Cytology - Urine    Chronic fatigue    Coronary artery disease, unspecified vessel or lesion type, unspecified whether angina present, unspecified whether native or transplanted heart      -Diabetes mellitus with complications  CMP done in 11/2022 revealed normal serum electrolytes, creatinine 1.0, glucose of 106 and normal LFTs.  Her DM has been well controlled.  She continues metformin 1000 mg BID and diabetic diet. HgA1c in 12/2022 was 6.1. Her POC glucose is 105 today. I will repeat an A1c today.  Her home fasting BS values average 108. She is currently on 1000 mg bid and I recommend she drop the dose to 500 mg bid.     -CAD/dyslipidemia: she is followed by [facility] cardiology and last seen in 11/2022 and encounter details reviewed with the pt today.    She continues on Repatha and low dose pravastatin as previously directed.  Her LDL cholesterol was 74 in 05/1477 and LFT's were normal in 11/2022 .  She will follow-up with cardiology in 1 years time.     -left sided Breast cancer history: infiltrating ductal carcinoma, estrogen receptor positive, progesterone receptor positive and HER2 negative s/p lumpectomy in 01/2020 and follow up radiation Rx completed in 02/2020.   She is   Followed annually by breast oncology and last seen in January/2024.Marland Kitchen She had a normal  mammogram and breast US at that time.  She will follow up with oncology in 03/2023.    -Dysuria with history of chronic UTIs and microscopic hematuria   She has been followed by [facility] urology and she was seen in  04/2021 and advised to follow up in 3 months time/she has not followed up as recommended.  She was started on macrodantin 100 mg daily for UTI prevention, but stopped due to side effects. She had cystoscopy last year which was normal. She was to start estrace  vaginal cream twice a week and Keflex 250 mg 1 tablet daily for UTI prevention.  She continues daily D Mannose and cranberry tablet, regimen which has helped as well.  Patient wishes to defer urology follow-up since she does not have UTI recurrence if she remains on this current regimen.  Given her recent weight loss, she is referred for abd/pelvic CT and I will obtain a urinalysis and urine cytology today.     -History of anemia - chronic iron def anemia  The pt is followed annually by [facility] hematology and last visit was in 01/2023 and  encounter details reviewed with the pt today. She has a history of AVM's with intermittent bleeding.  She is now followed by [facility] GI and seen in June/2023.  She has had 2  IV iron infusions since 01/2022 hematology visit. ( a total of 4 infusions in 2022).  CBC done in May/2024 was within normal limits.  Ferritin and iron levels were normal at that time.   EGD and colonoscopy were done in 2021 in the setting of acute GI bleed.  Bleeding was attributed to blood thinner and AVMs.  She is on low dose daily enteric aspirin instead, as per cardiology recommendation.         - chronic fatigue symptoms: possibly attributed to chronic anemia.  She is followed by hematology.  Vitamin D level was normal in August/2023.  TSH was normal in March/2024. Pt was seen by hematology early this month for follow up of iron def anemia and macrocytosis (history of AVM related GI bleeding). She received 2 iron infusions in 2023. She continues on po otc iron and Vit B 12 supplementation.  Her CBC done at that time revealed normal H/H at 13/38 with normal Vit B12/folate levels and iron studies. Myeloid panel and monoclonal gammopathy work up results were unrevealing. Abd [place] was done in 01/2023 was unremarkable.       -History of AVM related GI bleeding/chronic diarrhea: First noted in 2018.  Patient was seen by GI in June/2023 and encounter details reviewed with the patient today.  She was advised that the patient continue iron infusion as recommended by hematology she will continue on daily p.o. iron supplement as scheduled.  Her BM's are soft and mushy and she described this to her GI provider.  She has been losing weight since early this year.  She had colonoscopy done in 2021 with recommendation for repeat in 10 yrs.  She may start OTC metamucil as a stool binder and if no relief consider trial of Questran. She has loss of appetite which has resulted in her weight loss.  She has had follow up with GI in 04/2023.  She is unhappy with her current GI provider and she wishes to switch to another provider.   She was advised to by GI clinic that she would need a new GI referral to see another provider.  Referral submitted today. I will obtain Celiac panel today.       - weight loss since early this year:  CMP done in 11/2022 was essentially normal. TSH done at that time was normal.  She will proceed with GI (see above) since her predominant symptoms are abdominal in nature.  She is referred for Abd/pelvic CT with contrast.    Her BMI was 30 and now down to 27.        -Hypothyroidism: pt continues on levothyroxine supplementation as directed. Her TSH was normal in 01/2022.    -Hypertension:  Patient remains compliant with her current medication regimen.  She has been counseled on maintaining low-salt and low-fat dietary intake and exercise routine.  CMP done in March/2024 was within normal limits.  She continues on lisinopril 5 mg daily. She is reminded to start home BP monitoring. Her repeat BP today is 138/78.    - Cervical level spinal myoclonus, Dysphonia spastica and migraine headache history:  She is followed by [facility] neurology and last seen in 06/2022 and encounter details reviewed with the pt today.  She was advised to continue with her current medication regimen.  Dysphonia may be secondary to low iron levels (see above under hematology for details).  She was advised to follow-up with neurology in 6 months time.   This appt is scheduled.     - history of fall in 06/2022 with resulting facial injury: pt was seen at [facility] ED and diagnosed with blowout fracture of right orbital wall and right retinal tear   She is followed by facial bone specialist and opth.  Latter follow up was scheduled but she did not follow up since she is having no acute vision issues. She is advised to reschedule.     -  depression: Pt on  Prozac  20 mg daily..  She struggles with insomnia. She falls asleep with no issus but then wakes up and can't fall back to sleep. I recommended a trial trazodone 50 mg as directed. Given her ongoing struggles with fatigue and GI issues, she admits and I agree she has ongoing depression symptoms. I recommend increasing Prozac dose to 40 mg daily.     - history of COPD/ILD and pulmonary nodules: pt is part of a lung cancer screening study and she had a recent surveillance chest CT recently which revealed increase in lung nodule size and she is referred to [facility] pulmonary clinic for further evaluation.  She is followed by [facility] pulmonary clinic and last seen in April/2024.  Encounter details reviewed with the patient today.    She had PFTs done in December/2023 and she is scheduled for repeat PFT's in the near future. Repeat Chest CT done in 12/2022 remained unchanged/stable with recommendation for repeat in 1 yr.   She has an upcoming scheduled pulmonary follow up visit this coming fall.      - Recent history of left wrist fracture in January/2024. This has since healed.       I personally spent 40 minutes, face to face, and non face to face, in the care of this patient, which includes pre , intra and post visit time on date of service.  All documented time is specific to EM and dose not include any procedures performed today.        Return in about 3 months (around 04/30/2023) for Recheck; 40 min with Dr Rosine Beat.    Subjective:     HPI  The patient is seen today for routine follow-up visit.  I last saw the patient in January/2024 for routine visit and the following medical conditions:      -Diabetes mellitus with complications  CMP done in 11/2022 revealed normal serum electrolytes, creatinine 1.0, glucose of 106 and normal LFTs.  Her DM has been well controlled.  She continues metformin 1000 mg BID and diabetic diet. HgA1c in 12/2022 was 6.1. Her POC glucose is 105 today. I will repeat an A1c today.  Her home fasting BS values average 108. She is currently on 1000 mg bid and I recommend she drop the  dose to 500 mg bid.     -CAD/dyslipidemia: she is followed by [facility] cardiology and last seen in 11/2022 and encounter details reviewed with the pt today.    She continues on Repatha and low dose pravastatin as previously directed.  Her LDL cholesterol was 74 in 0/9811 and LFT's were normal in 11/2022 .  She will follow-up with cardiology in 1 years time.     -left sided Breast cancer history: infiltrating ductal carcinoma, estrogen receptor positive, progesterone receptor positive and HER2 negative s/p lumpectomy in 01/2020 and follow up radiation Rx completed in 02/2020.   She is   Followed annually by breast oncology and last seen in January/2024.Marland Kitchen She had a normal  mammogram and breast US at that time.  She will follow up with oncology in 03/2023.    -Dysuria with history of chronic UTIs and microscopic hematuria   She has been followed by [facility] urology and she was seen in  04/2021 and advised to follow up in 3 months time/she has not followed up as recommended.  She was started on macrodantin 100 mg daily for UTI prevention, but stopped due to side effects. She had cystoscopy last year which was normal. She was to start estrace vaginal cream twice a week and Keflex 250 mg 1 tablet daily for UTI prevention.  She continues daily D Mannose and cranberry tablet, regimen which has helped as well.  Patient wishes to defer urology follow-up since she does not have UTI recurrence if she remains on this current regimen.  Given her recent weight loss, she is referred for abd/pelvic CT and I will obtain a urinalysis and urine cytology today.     -History of anemia - chronic iron def anemia  The pt is followed annually by [facility] hematology and last visit was in 01/2023 and  encounter details reviewed with the pt today. She has a history of AVM's with intermittent bleeding.  She is now followed by [facility] GI and seen in June/2023.  She has had 2  IV iron infusions since 01/2022 hematology visit. ( a total of 4 infusions in 2022).  CBC done in May/2024 was within normal limits.  Ferritin and iron levels were normal at that time.   EGD and colonoscopy were done in 2021 in the setting of acute GI bleed.  Bleeding was attributed to blood thinner and AVMs.  She is on low dose daily enteric aspirin instead, as per cardiology recommendation.         - chronic fatigue symptoms: possibly attributed to chronic anemia.  She is followed by hematology.  Vitamin D level was normal in August/2023.  TSH was normal in March/2024. Pt was seen by hematology early this month for follow up of iron def anemia and macrocytosis (history of AVM related GI bleeding). She received 2 iron infusions in 2023. She continues on po otc iron and Vit B 12 supplementation.  Her CBC done at that time revealed normal H/H at 13/38 with normal Vit B12/folate levels and iron studies. Myeloid panel and monoclonal gammopathy work up results were unrevealing. Abd [place] was done in 01/2023 was unremarkable.       -History of AVM related GI bleeding/chronic diarrhea: First noted in 2018.  Patient was seen by GI in June/2023 and encounter details reviewed with the patient today.  She was advised that the patient continue iron infusion as recommended by hematology she will continue on daily p.o. iron supplement as scheduled.  Her BM's are soft and  mushy and she described this to her GI provider.  She has been losing weight since early this year.  She had colonoscopy done in 2021 with recommendation for repeat in 10 yrs.  She may start OTC metamucil as a stool binder and if no relief consider trial of Questran. She has loss of appetite which has resulted in her weight loss.  She has had follow up with GI in 04/2023.  She is unhappy with her current GI provider and she wishes to switch to another provider.   She was advised to by GI clinic that she would need a new GI referral to see another provider.  Referral submitted today. I will obtain Celiac panel today.       - weight loss since early this year:  CMP done in 11/2022 was essentially normal. TSH done at that time was normal.  She will proceed with GI (see above) since her predominant symptoms are abdominal in nature.  She is referred for Abd/pelvic CT with contrast.    Her BMI was 30 and now down to 27.        -Hypothyroidism: pt continues on levothyroxine supplementation as directed. Her TSH was normal in 01/2022.    -Hypertension:  Patient remains compliant with her current medication regimen.  She has been counseled on maintaining low-salt and low-fat dietary intake and exercise routine.  CMP done in March/2024 was within normal limits.  She continues on lisinopril 5 mg daily. She is reminded to start home BP monitoring. Her repeat BP today is 138/78.    - Cervical level spinal myoclonus, Dysphonia spastica and migraine headache history:  She is followed by [facility] neurology and last seen in 06/2022 and encounter details reviewed with the pt today.  She was advised to continue with her current medication regimen.  Dysphonia may be secondary to low iron levels (see above under hematology for details).  She was advised to follow-up with neurology in 6 months time.   This appt is scheduled.     - history of fall in 06/2022 with resulting facial injury: pt was seen at [facility] ED and diagnosed with blowout fracture of right orbital wall and right retinal tear   She is followed by facial bone specialist and opth.  Latter follow up was scheduled but she did not follow up since she is having no acute vision issues. She is advised to reschedule.     -  depression: Pt on  Prozac  20 mg daily..  She struggles with insomnia. She falls asleep with no issus but then wakes up and can't fall back to sleep. I recommended a trial trazodone 50 mg as directed. Given her ongoing struggles with fatigue and GI issues, she admits and I agree she has ongoing depression symptoms. I recommend increasing Prozac dose to 40 mg daily.     - history of COPD/ILD and pulmonary nodules: pt is part of a lung cancer screening study and she had a recent surveillance chest CT recently which revealed increase in lung nodule size and she is referred to [facility] pulmonary clinic for further evaluation.  She is followed by [facility] pulmonary clinic and last seen in April/2024.  Encounter details reviewed with the patient today.    She had PFTs done in December/2023 and she is scheduled for repeat PFT's in the near future. Repeat Chest CT done in 12/2022 remained unchanged/stable with recommendation for repeat in 1 yr.   She has an upcoming scheduled pulmonary follow up visit  this coming fall.      - Recent history of left wrist fracture in January/2024. This has since healed.         ROS  Constitutional:  Denies  unexpected weight loss or gain, or weakness   Eyes:  Denies visual changes  Respiratory:  Denies cough or shortness of breath. No change in exercise  tolerance  Cardiovascular:  Denies chest pain, palpitations or lower extremity swelling   GI:  Denies abdominal pain, diarrhea, constipation   Musculoskeletal:  Denies myalgias  Skin:  Denies nonhealing lesions  Neurologic:  Denies headache, focal weakness or numbness, tingling  Endocrine:  Denies polyuria or polydypsia   Psychiatric:  Denies depression, anxiety      Outpatient Medications Prior to Visit   Medication Sig Dispense Refill    aspirin (ECOTRIN) 81 MG tablet Take 1 tablet (81 mg total) by mouth daily.      cephalexin (KEFLEX) 250 MG capsule Take 1 capsule daily for UTI prevention 30 capsule 11    cholecalciferol, vitamin D3, (VITAMIN D3 ORAL) Take by mouth.      clonazePAM (KLONOPIN) 0.5 MG tablet Take 3 tabs per day as directed 90 tablet 2    empty container (SHARPS-A-GATOR DISPOSAL SYSTEM) Misc Use as directed for sharps disposal 1 each 2    evolocumab (REPATHA SURECLICK) 140 mg/mL PnIj Inject the contents of 1 pen (140 mg) under the skin every fourteen (14) days. 2 mL 11    ferrous gluconate 324 MG tablet Take 1 tablet (324 mg total) by mouth once.      levETIRAcetam (KEPPRA) 1000 MG tablet TAKE 1 TABLET BY MOUTH 2 TIMES DAILY 180 tablet 3    levETIRAcetam (KEPPRA) 250 MG tablet Take 1 tablet (250 mg total) by mouth two (2) times a day. 180 tablet 3    lisinopril (PRINIVIL,ZESTRIL) 5 MG tablet Take 1 tablet (5 mg total) by mouth daily. 90 tablet 3    metFORMIN (GLUCOPHAGE) 500 MG tablet Take 2 tablets (1,000 mg total) by mouth in the morning and 2 tablets (1,000 mg total) in the evening. Take with meals. 360 tablet 1    omeprazole 20 mg TbLD Take by mouth nightly as needed.      ONETOUCH ULTRA TEST Strp       SYNTHROID 100 mcg tablet Take 1 tablet (100 mcg total) by mouth daily. 90 tablet 3    traZODone (DESYREL) 50 MG tablet Take 1/2 to 1 tab at bedtime as directed 30 tablet 11    vitamin E acetate (VITAMIN E ORAL) Take by mouth.      FLUoxetine (PROZAC) 20 MG capsule Take 1 capsule (20 mg total) by mouth daily. 90 capsule 1    docusate sodium (COLACE) 100 MG capsule Take 1 capsule (100 mg total) by mouth daily as needed. (Patient not taking: Reported on 01/12/2023)       No facility-administered medications prior to visit.         Objective:       Vital Signs  BP 138/78  - Pulse 79  - Temp 36.3 ??C (97.3 ??F)  - Ht 167.6 cm (5' 6)  - Wt 76.6 kg (168 lb 14.4 oz)  - SpO2 96%  - BMI 27.26 kg/m??      Exam  General: normal appearance  EYES: Anicteric sclerae.  ENT: Oropharynx moist.  RESP: Relaxed respiratory effort. Clear to auscultation without wheezes or crackles.   CV: Regular rate and rhythm. Normal S1 and S2. No  murmurs or gallops.  No lower extremity edema. Posterior tibial pulses are 2+ and symmetric.  abd exam: non tender, no masses, no HSM   MSK: No focal muscle tenderness.  SKIN: Appropriately warm and moist.  NEURO: Stable gait and coordination.    Allergies:     Patient has no known allergies.    Current Medications:     Current Outpatient Medications   Medication Sig Dispense Refill    aspirin (ECOTRIN) 81 MG tablet Take 1 tablet (81 mg total) by mouth daily.      cephalexin (KEFLEX) 250 MG capsule Take 1 capsule daily for UTI prevention 30 capsule 11    cholecalciferol, vitamin D3, (VITAMIN D3 ORAL) Take by mouth.      clonazePAM (KLONOPIN) 0.5 MG tablet Take 3 tabs per day as directed 90 tablet 2    empty container (SHARPS-A-GATOR DISPOSAL SYSTEM) Misc Use as directed for sharps disposal 1 each 2    evolocumab (REPATHA SURECLICK) 140 mg/mL PnIj Inject the contents of 1 pen (140 mg) under the skin every fourteen (14) days. 2 mL 11    ferrous gluconate 324 MG tablet Take 1 tablet (324 mg total) by mouth once.      levETIRAcetam (KEPPRA) 1000 MG tablet TAKE 1 TABLET BY MOUTH 2 TIMES DAILY 180 tablet 3    levETIRAcetam (KEPPRA) 250 MG tablet Take 1 tablet (250 mg total) by mouth two (2) times a day. 180 tablet 3    lisinopril (PRINIVIL,ZESTRIL) 5 MG tablet Take 1 tablet (5 mg total) by mouth daily. 90 tablet 3    metFORMIN (GLUCOPHAGE) 500 MG tablet Take 2 tablets (1,000 mg total) by mouth in the morning and 2 tablets (1,000 mg total) in the evening. Take with meals. 360 tablet 1    omeprazole 20 mg TbLD Take by mouth nightly as needed.      ONETOUCH ULTRA TEST Strp       SYNTHROID 100 mcg tablet Take 1 tablet (100 mcg total) by mouth daily. 90 tablet 3    traZODone (DESYREL) 50 MG tablet Take 1/2 to 1 tab at bedtime as directed 30 tablet 11    vitamin E acetate (VITAMIN E ORAL) Take by mouth.      docusate sodium (COLACE) 100 MG capsule Take 1 capsule (100 mg total) by mouth daily as needed. (Patient not taking: Reported on 01/12/2023)      FLUoxetine (PROZAC) 40 MG capsule Take 1 capsule (40 mg total) by mouth daily. 90 capsule 3     No current facility-administered medications for this visit.           Note - This record has been created using [employer]. Chart creation errors have been sought, but may not always have been located. Such creation errors do not reflect on the standard of medical care.    [name], MD

## 2023-01-28 ENCOUNTER — Ambulatory Visit: Admit: 2023-01-28 | Discharge: 2023-01-29 | Payer: PRIVATE HEALTH INSURANCE

## 2023-01-28 DIAGNOSIS — R634 Abnormal weight loss: Principal | ICD-10-CM

## 2023-01-28 DIAGNOSIS — J383 Other diseases of vocal cords: Principal | ICD-10-CM

## 2023-01-28 DIAGNOSIS — K5520 Angiodysplasia of colon without hemorrhage: Principal | ICD-10-CM

## 2023-01-28 DIAGNOSIS — F32A Depression, unspecified depression type: Principal | ICD-10-CM

## 2023-01-28 DIAGNOSIS — R918 Other nonspecific abnormal finding of lung field: Principal | ICD-10-CM

## 2023-01-28 DIAGNOSIS — R109 Unspecified abdominal pain: Principal | ICD-10-CM

## 2023-01-28 DIAGNOSIS — Z170 Estrogen receptor positive status [ER+]: Principal | ICD-10-CM

## 2023-01-28 DIAGNOSIS — C50312 Malignant neoplasm of lower-inner quadrant of left female breast: Principal | ICD-10-CM

## 2023-01-28 DIAGNOSIS — I10 Essential (primary) hypertension: Principal | ICD-10-CM

## 2023-01-28 DIAGNOSIS — I2510 Atherosclerotic heart disease of native coronary artery without angina pectoris: Principal | ICD-10-CM

## 2023-01-28 DIAGNOSIS — R3129 Other microscopic hematuria: Principal | ICD-10-CM

## 2023-01-28 DIAGNOSIS — E118 Type 2 diabetes mellitus with unspecified complications: Principal | ICD-10-CM

## 2023-01-28 DIAGNOSIS — E039 Hypothyroidism, unspecified: Principal | ICD-10-CM

## 2023-01-28 DIAGNOSIS — E782 Mixed hyperlipidemia: Principal | ICD-10-CM

## 2023-01-28 DIAGNOSIS — K529 Noninfective gastroenteritis and colitis, unspecified: Principal | ICD-10-CM

## 2023-01-28 DIAGNOSIS — R5382 Chronic fatigue, unspecified: Principal | ICD-10-CM

## 2023-01-28 DIAGNOSIS — Z862 Personal history of diseases of the blood and blood-forming organs and certain disorders involving the immune mechanism: Principal | ICD-10-CM

## 2023-01-28 LAB — URINALYSIS WITH MICROSCOPY
BILIRUBIN UA: NEGATIVE
BLOOD UA: NEGATIVE
GLUCOSE UA: NEGATIVE
HYALINE CASTS: 1 /LPF (ref 0–1)
KETONES UA: NEGATIVE
NITRITE UA: NEGATIVE
PH UA: 5.5 (ref 5.0–9.0)
PROTEIN UA: NEGATIVE
RBC UA: 1 /HPF (ref <=4)
SPECIFIC GRAVITY UA: 1.021 (ref 1.003–1.030)
SQUAMOUS EPITHELIAL: 3 /HPF (ref 0–5)
UROBILINOGEN UA: <2
WBC UA: 4 /HPF (ref 0–5)

## 2023-01-28 LAB — HEMOGLOBIN A1C
ESTIMATED AVERAGE GLUCOSE: 105 mg/dL
HEMOGLOBIN A1C: 5.3 % (ref 4.8–5.6)

## 2023-01-28 LAB — IGA: GAMMAGLOBULIN; IGA: 233.6 mg/dL (ref 70.0–400.0)

## 2023-01-28 MED ORDER — FLUOXETINE 40 MG CAPSULE
ORAL_CAPSULE | Freq: Every day | ORAL | 3 refills | 90 days | Status: CP
Start: 2023-01-28 — End: 2024-01-28

## 2023-01-28 NOTE — Unmapped (Signed)
Spoke with patient. She doesn't want to see Dr. Chales Abrahams. Patient stated that She felt Dr. Chales Abrahams was not very personable and that when she last saw him, he placed his hand her abdomen and stated I think you're going to be just fine. It is easier to give iron infusions when needed as opposed to putting you to sleep for a colonoscopy. Patient would like a referral to GI but with a different provider.  Patient stated that she has a CT scheduled for [facility] on 02/03/23

## 2023-01-28 NOTE — Unmapped (Signed)
Patient called & was very upset about Dr. Vinson Moselle referring her to Dr. Chales Abrahams. Patient refuses to see him & wants to speak w/ someone in actual clinic regarding this.

## 2023-01-29 LAB — TISSUE TRANSGLUTAMINASE (TTG), IGA
TISSUE TRANSGLUTAMINASE ANTIBODY, IGA: 0.3 U/mL (ref <7)
TTG INTERPRETATION: NEGATIVE

## 2023-01-29 NOTE — Unmapped (Signed)
Patient called back and stated that the scheduler at [facility] told her that since she saw Dr. Chales Abrahams last year, she would have to see him again and that they will not schedule her with anyone else. Patient is requesting a new referral outside of [facility] and [facility]. Patient stated that she is willing to be seen at [facility].

## 2023-01-29 NOTE — Unmapped (Signed)
Essentially normal urine with micro exam: A1c is normal at 5.3 with glucose of 105: Celiac AgA is normal.

## 2023-01-31 DIAGNOSIS — K5520 Angiodysplasia of colon without hemorrhage: Principal | ICD-10-CM

## 2023-01-31 DIAGNOSIS — R109 Unspecified abdominal pain: Principal | ICD-10-CM

## 2023-01-31 DIAGNOSIS — K529 Noninfective gastroenteritis and colitis, unspecified: Principal | ICD-10-CM

## 2023-01-31 DIAGNOSIS — R634 Abnormal weight loss: Principal | ICD-10-CM

## 2023-01-31 NOTE — Unmapped (Signed)
Further celiac testing results are negative.

## 2023-02-02 NOTE — Unmapped (Signed)
Patient has been notified

## 2023-02-03 ENCOUNTER — Ambulatory Visit: Admit: 2023-02-03 | Discharge: 2023-02-04 | Payer: PRIVATE HEALTH INSURANCE

## 2023-02-03 MED ADMIN — iohexol (OMNIPAQUE) 350 mg iodine/mL solution 100 mL: 100 mL | INTRAVENOUS | @ 17:00:00 | Stop: 2023-02-03

## 2023-02-03 NOTE — Unmapped (Signed)
Urine cytology is negative.

## 2023-02-04 NOTE — Unmapped (Signed)
Notify the patient that her abdominal CT reveals no acute findings except for large stool volume noted in the colon and mild increase in previously noted nodules in both adrenal glands.  The patient has been referred to GI.  Please confirm that patient has been contacted about this appointment since she can review the CT scan with the GI provider.  If not, please confirm that patient has telephone number to GI clinic to schedule appointment directly.

## 2023-02-04 NOTE — Unmapped (Signed)
Patient saw results on mychart  

## 2023-02-04 NOTE — Unmapped (Signed)
Normal point of care creatinine at 1.1.

## 2023-02-05 DIAGNOSIS — N390 Urinary tract infection, site not specified: Principal | ICD-10-CM

## 2023-02-05 MED ORDER — CLONAZEPAM 0.5 MG TABLET
ORAL_TABLET | 0 refills | 0 days | Status: CP
Start: 2023-02-05 — End: ?

## 2023-02-05 MED ORDER — LEVETIRACETAM 1,000 MG TABLET
ORAL_TABLET | 3 refills | 0 days | Status: CP
Start: 2023-02-05 — End: ?

## 2023-02-05 MED ORDER — CEPHALEXIN 250 MG CAPSULE
ORAL_CAPSULE | 11 refills | 0 days
Start: 2023-02-05 — End: ?

## 2023-02-05 NOTE — Unmapped (Signed)
Patient is requesting the following refill  Requested Prescriptions     Pending Prescriptions Disp Refills    cephalexin (KEFLEX) 250 MG capsule [Pharmacy Med Name: CEPHALEXIN 250 MG CAP] 30 capsule 11     Sig: TAKE ONE CAPSULE DAILY FOR UTI PREVENTION       Recent Visits  Date Type Provider Dept   01/28/23 Office Visit [name], MD [facility] [address] [facility]   09/10/22 Office Visit [name], MD [facility] [address] [facility]   04/28/22 Office Visit [name], MD [facility] [address] [facility]   Showing recent visits within past 365 days with a meds authorizing provider and meeting all other requirements  Future Appointments  Date Type Provider Dept   04/30/23 Appointment Mangel, Benison Pap, DO [facility] [address] [facility]   Showing future appointments within next 365 days with a meds authorizing provider and meeting all other requirements       Labs:

## 2023-02-05 NOTE — Unmapped (Signed)
Addended by: Glendell Docker on: 02/05/2023 03:13 PM     Modules accepted: Orders

## 2023-02-08 MED ORDER — CEPHALEXIN 250 MG CAPSULE
ORAL_CAPSULE | 11 refills | 0 days | Status: CP
Start: 2023-02-08 — End: ?

## 2023-02-09 NOTE — Unmapped (Signed)
Patient AWV scheduled for 08/26 at 1:50pm @Primary  Care at [facility] with [facility]    Abstraction Result Flowsheet Data    Reason for Encounter  Reason for Encounter: Outreach  Primary Reason for Outreach: AWV  Text Message: No  MyChart Message: No  Outreach Call Outcome: Scheduled Telephone

## 2023-02-16 MED ORDER — ONETOUCH ULTRA TEST STRIPS
Freq: Every day | 1 refills | 0 days | Status: CP
Start: 2023-02-16 — End: 2024-02-16

## 2023-02-16 NOTE — Unmapped (Signed)
Patient called requesting a refill of the Keppra. I reached out to the patients pharmacy and they stated patient should have a one month supply left. The next refill can be done at the end of the month.

## 2023-03-03 NOTE — Unmapped (Signed)
[facility] Refill Coordination Note    Specialty Medication(s) to be Shipped:   General Specialty: Repatha    Other medication(s) to be shipped: No additional medications requested for fill at this time     Amber Duncan, DOB: [birth year]  Phone: [phone] (home) [phone] (work)      All above HIPAA information was verified with patient.     Was a Nurse, learning disability used for this call? No    Completed refill call assessment today to schedule patient's medication shipment from the [facility] [phone]).  All relevant notes have been reviewed.     Specialty medication(s) and dose(s) confirmed: Regimen is correct and unchanged.   Changes to medications: Amber Duncan reports no changes at this time.  Changes to insurance: No  New side effects reported not previously addressed with a pharmacist or physician: None reported  Questions for the pharmacist: No    Confirmed patient received a Conservation officer, historic buildings and a Surveyor, mining with first shipment. The patient will receive a drug information handout for each medication shipped and additional FDA Medication Guides as required.       DISEASE/MEDICATION-SPECIFIC INFORMATION        For patients on injectable medications: Patient currently has 1 doses left.  Next injection is scheduled for 6/27.    SPECIALTY MEDICATION ADHERENCE     Medication Adherence    Patient reported X missed doses in the last month: 0  Specialty Medication: REPATHA SURECLICK 140 mg/mL  Patient is on additional specialty medications: No              Were doses missed due to medication being on hold? No    Repatha 140 mg/ml: 1 days of medicine on hand        REFERRAL TO PHARMACIST     Referral to the pharmacist: Not needed      [facility]     Shipping address confirmed in Epic.       Delivery Scheduled: Yes, Expected medication delivery date: 03/08/23.     Medication will be delivered via Same Day Courier to the prescription address in Epic WAM.    [facility] Specialty Technician

## 2023-03-08 MED FILL — REPATHA SURECLICK 140 MG/ML SUBCUTANEOUS PEN INJECTOR: SUBCUTANEOUS | 84 days supply | Qty: 6 | Fill #4

## 2023-03-25 ENCOUNTER — Ambulatory Visit: Admit: 2023-03-25 | Payer: PRIVATE HEALTH INSURANCE | Attending: Radiation Oncology | Primary: Radiation Oncology

## 2023-03-25 DIAGNOSIS — Z170 Estrogen receptor positive status [ER+]: Principal | ICD-10-CM

## 2023-03-25 DIAGNOSIS — C50812 Malignant neoplasm of overlapping sites of left female breast: Principal | ICD-10-CM

## 2023-03-25 NOTE — Unmapped (Signed)
RADIATION ONCOLOGY FOLLOW-UP VISIT NOTE     Encounter Date: 03/25/2023  Patient Name: Amber Duncan  Medical Record Number: [id number]    DIAGNOSIS:  72yo with a pT1bN0 L breast IDC (ER/PR pos, HER2 neg) s/p partial mastectomy (0.7cm, grade 1, no LVI) and adjuvant RT (42.72Gy finished 03/28/2020)    DURATION SINCE COMPLETION OF RADIOTHERAPY:  3 years (03/28/2020)    ASSESSMENT:  Disease Status: No Evidence of Disease (NED) on mammogram 09/2022 or on exam today    RECOMMENDATIONS:  FOLLOW-UP:  Surg onc in 6 months with bilateral mammo.  I can see back in 12 months  R breast pain:  Resolved, no concerning findings on recent mammo  Skin:  No significant- has a small mole/skin tag on the L breast- she'll discuss further with her PCP and discussed prn steroid for itching for now  Lung nodule and possible ILD- following with pulm- undergoing LD-chest CT for screening  Iron deficiency:  Following with hematology  Weight loss:  Unclear etiology:  following with several specialists and awaiting GI evaluation.  Endocrine therapy:  Declined    INTERVAL HISTORY:  Continues to have a number of medical issues and following with various specialists.  The issues that bother her the most are generalized weakness and voice changes- seems like some of this is related to underlying dystonia and she is following with neurology.  Also has poor appetite, weight loss, and chronic diarrhea- GI evaluation later this year.  Recently had a CT A/P that didn't show any obvious issues although mild enlargement of bilateral adrenal nodules noted.  No new breast issues aside from a small itchy raised lesion on the L breast.  Otherwise some mild breast soreness (stable from prior).  No L breast swelling/arm swelling.  No ROM limitations.  Went to [place] to visit her family earlier this year and hoping to plan another trip in the near future.    REVIEW OF SYSTEMS:  A comprehensive review of 10 systems was negative except for pertinent positives noted in HPI.    PAST MEDICAL HISTORY/FAMILY HISTORY/SOCIAL HISTORY:  Reviewed in EPIC    ALLERGIES/MEDICATIONS:  Reviewed in EPIC    PHYSICAL EXAM:  Vital Signs for this encounter:   BP 104/67  - Pulse 72  - Temp 36.2 ??C (97.2 ??F) (Temporal)  - Resp 18  - Wt 74.6 kg (164 lb 8 oz)  - SpO2 98%  - BMI 26.55 kg/m??   Karnofsky/Lansky Performance Status: 80, Normal activity with effort; some signs or symptoms of disease (ECOG equivalent 1)  General:   No acute distress, alert and oriented X 4   Head: Normocephalic, without obvious abnormality, atraumatic   Eyes: EOMI, no scleral icterus  Lungs:Normal work of breathing  Heart: RRR, S1/S2 normal, no murmur/rub/gallop  Extremities: Extremities normal, atraumatic.  No edema in bilateral upper extremities.  Lymph nodes: No palpable axillary or supraclavicular lymphadenopathy  Neurologic: Grossly normal  Breast:  Bilateral breast exam performed.  No palpable mass in the R breast, no overlying skin changes.  No palpable mass in the L breast.  Small skin colored raised lesion on L breast, no surrounding erythema or other skin changes noted.    RADIOLOGY:      10/01/2022 Bilateral diagnostic mammo  here are postlumpectomy changes in the left breast.   A triangle was placed on the skin over the site of focal pain in the lower central left breast. No suspicious mammographic finding underlies the triangle.   There is  no suspicious mass, calcifications, or other abnormality in either breast.   There is no significant change compared with prior mammography.      Targeted left breast ultrasound:   Targeted ultrasound was performed of the lower central left breast. No suspicious sonographic finding is present. Representative negative images document the left breast at 6:00, 12 cm from the nipple.              Labs:    No results found for: WBC, HGB, HCT, PLT, LDH, CREATININE, AST, ALT, MG    Rayetta Humphrey, MD  Assistant Professor  [facility] Dept of Radiation Oncology  03/25/2023

## 2023-03-25 NOTE — Unmapped (Signed)
Mrs. Buechler here for follow-up with Dr. Carles Collet.    Skin: Intact - pt noticed a skin tag/mole of side of left breast - occasional itches; pt reports breast gets sore  ROM: Limited movement in arms bilaterally  Pain/Swelling/Lymphedema: No pain or edema  Energy Level/Fatigue/Active Level: Not feeling well today - pt has not felt well for a very long time  Elimination: No issues  Appetite/Diet: Poor - pt just not hungry but eats because she needs to  Nausea/Vomitting: Occasional nausea - does not take anything for it at this time     Last Mammogram: Mammo/US - Jan 2024; pt does not do self breast exams  Chemo: NA  Weight: Pt has lost approx 25-30lbs since January 2024  Tamoxifen/Letrozole/Aromasin (hot flashes, joint pain, depression): NA  Psychosocial: No needs  Refills of Meds: None    Notified Dr. Carles Collet.

## 2023-04-09 MED ORDER — CLONAZEPAM 0.5 MG TABLET
0 refills | 0 days | Status: CP
Start: 2023-04-09 — End: ?

## 2023-04-12 ENCOUNTER — Ambulatory Visit
Admit: 2023-04-12 | Discharge: 2023-04-13 | Payer: PRIVATE HEALTH INSURANCE | Attending: Student in an Organized Health Care Education/Training Program | Primary: Student in an Organized Health Care Education/Training Program

## 2023-04-12 DIAGNOSIS — D500 Iron deficiency anemia secondary to blood loss (chronic): Principal | ICD-10-CM

## 2023-04-12 DIAGNOSIS — I10 Essential (primary) hypertension: Principal | ICD-10-CM

## 2023-04-12 DIAGNOSIS — Z170 Estrogen receptor positive status [ER+]: Principal | ICD-10-CM

## 2023-04-12 DIAGNOSIS — K5520 Angiodysplasia of colon without hemorrhage: Principal | ICD-10-CM

## 2023-04-12 DIAGNOSIS — R5383 Other fatigue: Principal | ICD-10-CM

## 2023-04-12 DIAGNOSIS — J849 Interstitial pulmonary disease, unspecified: Principal | ICD-10-CM

## 2023-04-12 DIAGNOSIS — E039 Hypothyroidism, unspecified: Principal | ICD-10-CM

## 2023-04-12 DIAGNOSIS — N390 Urinary tract infection, site not specified: Principal | ICD-10-CM

## 2023-04-12 DIAGNOSIS — E119 Type 2 diabetes mellitus without complications: Principal | ICD-10-CM

## 2023-04-12 DIAGNOSIS — F32A Depression, unspecified depression type: Principal | ICD-10-CM

## 2023-04-12 DIAGNOSIS — K5900 Constipation, unspecified: Principal | ICD-10-CM

## 2023-04-12 DIAGNOSIS — I2510 Atherosclerotic heart disease of native coronary artery without angina pectoris: Principal | ICD-10-CM

## 2023-04-12 DIAGNOSIS — J383 Other diseases of vocal cords: Principal | ICD-10-CM

## 2023-04-12 DIAGNOSIS — C50312 Malignant neoplasm of lower-inner quadrant of left female breast: Principal | ICD-10-CM

## 2023-04-12 NOTE — Unmapped (Signed)
Assessment and Plan:     72 yo female presents today for follow up     #Coronary artery disease involving native coronary artery of native heart without angina pectoris, Essential hypertension, HLD  -undergone 2xPCIs to RCA with most recently in setting of stable angina in 07/2019  -ACEi/ARB: Lisinopril 5mg  every day. Previously on Amlodipine but stopped at cardiology visit on 11/24/2022.  -Lipid Lowering agents: Repatha  140mg  every 14 days. Previously on Pravastatin but stopped at cardiology visit on 11/24/2022  -Anti-platelet therapy: Aspirin 81mg  every day     #Diabetes mellitus without complication (CMS-HCC)  -Currently controled with last A1C of 5.3% on 01/28/2023  -Currently taking: Metformin 1000mg  BID. It was recommended during May visit to decrease Metformin to 500mg  BID; however she is continuing to take 1000mg  BID  -Foot exam and urine microalbumin due next visit. Diabetic eye exam due 07/2023    #Malignant neoplasm of lower-inner quadrant of left breast in female, estrogen receptor positive (CMS-HCC)  -History of infiltrating ductal carcinoma, estrogen receptor positive, progesterone receptor positive and HER2 negative s/p lumpectomy in 01/2020 and follow up radiation Rx completed in 02/2020   -Following closely with Oncology, last visit on 03/25/2023:  RECOMMENDATIONS:  FOLLOW-UP:  Surg onc in 6 months with bilateral mammo.  I can see back in 12 months  R breast pain:  Resolved, no concerning findings on recent mammo  Skin:  No significant- has a small mole/skin tag on the L breast- she'll discuss further with her PCP and discussed prn steroid for itching for now  Lung nodule and possible ILD- following with pulm- undergoing LD-chest CT for screening  Iron deficiency:  Following with hematology  Weight loss:  Unclear etiology:  following with several specialists and awaiting GI evaluation.  Endocrine therapy:  Decline  -next Surg/Onc appointment on 10/07/2072    #Chronic/Recurrent UTI  -She was previously following with urology. However as noted by previous PCP, she declined referral/follow up with Urology at visit in May 2024  -Currently taking: Keflex 250mg  every day and Estrace Vaginal Cream twice a week    #Angiodysplasia of intestine, Constipation, unspecified constipation type  -First noted in 2018.   -Her last Colonoscopy was in 2021 at [facility]  -PPI: Omepreazole 20mg  at bedtime PRN   -Bowel regimen: Colace 100mg  every day PRN   -She continues to have constipation however. We reviewed her CT Abdomen/pelvis with contrast from 02/03/2023 in depth which noted Large Volume Colonic Solid stool burden. I offered repeating imaging and obtaining labs. However she has declined to pursue further evaluation but continued to state during the appointment that she just wants someone to tell her that she has colon cancer.  -I advised importance of keeping GI appointment that is currently scheduled on 06/10/2023  -Discussed importance of increasing Water intact to ideally 2L a day as well as continuing colace DAILY. Additionally advised to start Senna as well      #Hypothyroidism, unspecified type  -Currently taking Synthroid     #Iron deficiency Anemia  -Chronic in nature. She is following with Hematology with last visit on 01/12/2023  -she is currently taking: Ferrous Gluconate 324mg  every day  -Next hematology visit on 05/17/2023    #Occipital Neuralgia, Cervical level spinal myoclonus, Dysphonia spastica and migraine headache history  -She was following with Neurology. However she notes that her symptoms are getting worse   -She is currently taking: Levetiracetam 250mg  BID and Clonazepam 0.5mg  TID PRN  -Next neurology appointment on 08/23/2023    #  Depression, insomnia  -Currently taking Prozac 40mg  every day. She was previously recommended to trial Trazodone 50mg  for her insomnia   -PHQ-2: 0 but she does feel fatigue often. GAD-7 score: 2    #COPD/ILD, Pulmonary Nodules  -She is part of a lung cancer screening study and is following with Pulmonology (last appointment on 12/25/2022)  -CT chest obtained on 12/31/2022.   -Next pulmonology appointment 06/29/2023.    #Fatigue, unspecified type  -Chronic but not improving. Discussed multiple etiologies that could be contributing to her fatigue  Wt Readings from Last 6 Encounters:   04/12/23 74.2 kg (163 lb 9.6 oz)   03/25/23 74.6 kg (164 lb 8 oz)   01/28/23 76.6 kg (168 lb 14.4 oz)   01/12/23 75.2 kg (165 lb 12.8 oz)   11/24/22 79.3 kg (174 lb 12.8 oz)   10/30/22 82.6 kg (182 lb 3.2 oz)   -She has had lost around 20 lbs since February. However she has declined any further work up at this time    HEALTH MAINTENANCE  -She reports that she will be going to [place] on Monday, August 12th until September 6th and does not want any further work up performed before this trip  -Will discus obtaining Mammogram next visit as last mammogram was 03/17/2022  -Will discuss obtaining DEXA scan in 2025 as last DEXA on 11/19/2022: Lumbar spine: Normal bone density. Left proximal femur: The total femoral density is normal, but the femoral neck density is low.  -Advised repeat imaging in 2025 for adrenal nodules that have been present since 2007    Advised chronic care follow up in 3 months     I personally spent 50 minutes face-to-face and non-face-to-face in the care of this patient, which includes all pre, intra, and post visit time on the date of service.  All documented time was specific to the E/M visit and does not include any procedures that may have been performed.        Subjective:     Amber Duncan 72 y.o.female   is being seen in follow up     She notes that her laryngitis/hoarseness is getting worse when she tries to talk   She is still taking the Keppra and Klonopin     She note she has sometimes bowel incontinence  She is concerned that she will have a blockage and if it will bust   She does take colace and enemas   She notes that sh wil still have greasy stool in her bowel.       She does want to see Dr. Chales Abrahams as she was told that she is at risk with anesthesia and didn't need a colonoscopy. She was billed 489 dollars     She reports that she did have intussusception and needed surgery     She had unintended weight loss per patient (she reports 30 lbs weight loss unintended)     She notes her abdominal pain is at night  She is going to [place] for 3 weeks     She notes the fatigue does not leave her  -She notes that the keppra and klonipin has not exacerbated he rsymptoms.         No LMP recorded. Patient has had a hysterectomy.        ROS:     ROS negative unless otherwise noted in HPI    Vital Signs:     Wt Readings from Last 3 Encounters:   04/12/23 74.2 kg (  163 lb 9.6 oz)   03/25/23 74.6 kg (164 lb 8 oz)   01/28/23 76.6 kg (168 lb 14.4 oz)     Temp Readings from Last 3 Encounters:   04/12/23 36.7 ??C (98.1 ??F) (Temporal)   03/25/23 36.2 ??C (97.2 ??F) (Temporal)   01/28/23 36.3 ??C (97.3 ??F)     BP Readings from Last 3 Encounters:   04/12/23 130/60   03/25/23 104/67   01/28/23 138/78     Pulse Readings from Last 3 Encounters:   04/12/23 86   03/25/23 72   01/28/23 79       Objective:     General Appearance: Alert, cooperative, not toxic or ill appearing   Head and Neck: Normocephalic, atraumatic.   EENT: EOMI, wearing facemask. No nasal congestion present. Voice is hoarse however  Cardiovascular:  Regular rate and rhythm. No murmurs  Respiratory: Lungs clear to auscultation bilaterally,  Respiratory effort unremarkable. No wheezes or rhonchi.   Abdomen: Abdomen is soft and not distended. She does have mild TTP in lower abdomen. There is no rigidity or guarding present  Musculoskeletal: Gait is stable at this time without use of assistance devices  Extremities :No edema.    Integumentary: Warm and dry. Ecchymosis on upper extremities  Neurologic: Alert and oriented x3. CN II-XII grossly intact.   Psychiatric: She is very frustrated and irritated today

## 2023-05-03 NOTE — Unmapped (Unsigned)
Here is your personalized prevention plan based on your Annual Wellness Visit today.    Medicare Screening & Prevention Guidelines Recommendations Dates Completed HM Status and Next Due Follow-Up   Colorectal Cancer Screening Patients 45 to 75: stool cards annually OR colonoscopy every 10 years (or more frequently if high risk) OR FIT-DNA every 3 years.  Colonoscopy date: 01/21/2017  FOBT/FIT date: Not Found  Sigmoidoscopy date: Not Found  FIT-DNA date: Not Found Health Maintenance Summary     This patient has no relevant Health Maintenance data.       Up to date   DEXA Bone Density Measurement Patients age 61-95 to have a Dexa every 5 years in postmenopausal women and high risk males. Males will defer to PCP DEXA date: 11/18/2020 Health Maintenance Summary    -      DEXA Scan (Every 5 Years) Next due on 11/18/2025    11/18/2020  XR Dexa Bone Density Skeletal             Up to date   Diabetes Eye Exam Annually if Diabetic. Eye Exam date: 07/10/2022 Health Maintenance Summary    -      Retinal Eye Exam (Yearly) Next due on 07/11/2023    07/10/2022  SmartData: FINDINGS - PE - EYES - FUNDUSCOPIC - PERIPHERY -   LEFT PERIPHERY NORMAL    07/10/2022  SmartData: OPHTH FUNDUS OD PERIPHERY    07/10/2022  SmartData: OPHTH FUNDUS OS PERIPHERY    07/10/2022  OCT, Retina - OU - Both Eyes    07/03/2022  SmartData: FINDINGS - PE - EYES - FUNDUSCOPIC - PERIPHERY -   LEFT PERIPHERY NORMAL    Only the first 5 history entries have been loaded, but more history   exists.           Up to date   Diabetes Foot Exam Annually if Diabetic. Most Recent Foot Exam Date: 04/12/2023 Health Maintenance Summary    -      Foot Exam (Yearly) Next due on 04/11/2024    04/12/2023  SmartData: WORKFLOW - DIABETES - DIABETIC FOOT EXAM PERFORMED      04/28/2022  SmartData: WORKFLOW - DIABETES - DIABETIC FOOT EXAM PERFORMED      08/13/2017  SmartData: WORKFLOW - DIABETES - DIABETIC FOOT EXAM PERFORMED      08/12/2017  SmartData: WORKFLOW - DIABETES - DIABETIC FOOT EXAM PERFORMED      08/10/2017  SmartData: WORKFLOW - DIABETES - DIABETIC FOOT EXAM PERFORMED      Only the first 5 history entries have been loaded, but more history   exists.           Up to date   Diabetes Urine Albumin/Creatinine Ratio Annually if Diabetic UACR Date: 04/28/2022   Health Maintenance Summary    -      Urine Albumin/Creatinine Ratio (Yearly) Due since 04/29/2023    04/28/2022  Albumin/Creatinine Ratio, Urine component of   Albumin/creatinine urine ratio    10/11/2020  Albumin/Creatinine Ratio, Urine component of   Albumin/creatinine urine ratio             {Care Manager Follow Up:55662}   Diabetes Hemoglobin A1c Every 3 or 6 months depending on last result Last Hemoglobin A1c Date: 01/28/2023 Health Maintenance Summary    -      Hemoglobin A1c (Every 6 Months) Next due on 07/31/2023    01/28/2023  Hemoglobin A1c component of Hemoglobin A1c    09/10/2022  HGB A1C, RAP/PDS component of POCT  glycosylated hemoglobin   (Hb A1C)    01/12/2022  HGB A1C, RAP/PDS component of POCT glycosylated hemoglobin   (Hb A1C)    05/15/2021  HGB A1C, RAP/PDS component of POCT glycosylated hemoglobin   (Hb A1C)    10/11/2020  HGB A1C, RAP/PDS component of POCT glycosylated hemoglobin   (Hb A1C)    Only the first 5 history entries have been loaded, but more history   exists.           Up to date   Heart Disease Screening (fasting lipid panel) Minimum of every 5 years, patients age 64-75,  if no apparent signs or symptoms of heart disease. LDL date: 09/10/2022  Total choleseterol date: 09/10/2022  HDL date: 09/10/2022  Triglycerides date: 09/10/2022 Health Maintenance Summary    -      Lipid Screening (Every 5 Years) Next due on 09/11/2027    09/10/2022  SmartData: [facility] TOTAL CHOLESTEROL AND HDL COMPLETE    09/10/2022  Lipid Panel    07/15/2021  Lipid Panel    08/05/2020  Lipid panel (non-fasting)    06/17/2020  Lipid panel (non-fasting)    Only the first 5 history entries have been loaded, but more history   exists. Up to date   Mammogram Screening Age 7-74 every 2 years.  Mammogram date: 10/01/2022 Health Maintenance Summary    -      Scheduled - Mammogram Start Age 107 (Every 2 Years) Scheduled for   10/04/2023    10/01/2022  Mammo Digital Diagnostic Tomo Bilateral    03/25/2022  Mammo Digital Diagnostic Tomo Right    09/19/2021  Mammo Digital Diagnostic Tomo Bilateral    01/28/2021  Mammo Digital Diagnostic Tomo Bilateral    08/05/2020  Mammo Diagnostic Left    Only the first 5 history entries have been loaded, but more history   exists.           Up to date   Pelvic Exam & Pap Smear Women ages 75 to 21 every 3 years with negative cytology (pap smear)  OR,   women ages 3 to 53 every 5 years if they have had both a negative pap and human papillomavirus (HPV) OR,  Every 3 years if they had a positive HPV result Pap Smear date: Not Found  HPV date: Not Found Health Maintenance Summary     This patient has no relevant Health Maintenance data.     Not within age range   Hepatitis C Screening A one-time screening for HCV infection for adults age 74 to 74 years old.  HCV screening date: 01/20/2017 Health Maintenance Summary    -      Hepatitis C Screen  Completed    01/20/2017  Hepatitis C Ab component of Hepatitis C antibody             Complete   Covid-19 Vaccine For persons 5 and older Dose 1 10/20/2019  Dose 2 11/10/2019  Dose 3 05/31/2020  Dose 4        Health Maintenance   Topic Date Due    COVID-19 Vaccine (4 - 2023-24 season) 05/08/2022      {Care Manager Follow Up ZOXW:96045}   Tdap Every 10 years (will not be covered by Medicare). Check with your pharmacy to see if you are eligible for this vaccine based on your insurance. DTap/Tdap/TD vaccination: 01/12/2023 Health Maintenance Summary    -      DTaP/Tdap/Td Vaccines (2 - Td or Tdap) Next due on 01/11/2033  01/12/2023  Imm Admin: TdaP           Up to date   Influenza Vaccine Annually  Influenza Vaccination: Not Found   Health Maintenance Summary    - Influenza Vaccine (1) Next due on 05/09/2023    05/29/2022  Imm Admin: INFLUENZA QUAD HIGH DOSE 79YRS+(FLUZONE)    05/15/2021  Imm Admin: INFLUENZA INJ MDCK PF, QUAD,(FLUCELVAX)(56MO AND UP   EGG FREE)    06/06/2020  Imm Admin: Influenza Virus Vaccine, unspecified formulation    05/17/2020  Imm Admin: Influenza Virus Vaccine, unspecified formulation    04/27/2019  Imm Admin: INFLUENZA QUAD HIGH DOSE 79YRS+(FLUZONE)    Only the first 5 history entries have been loaded, but more history   exists.             {Care Manager Follow Up:55662}   Pneumococcal Vaccine  For people ?65, or with an underlying condition Pneumonia vaccination: 04/27/2019   Health Maintenance Summary    -      Pneumococcal Vaccine 65+ (Series Information) Completed    04/27/2019  Imm Admin: PNEUMOCOCCAL POLYSACCHARIDE 23-VALENT    02/09/2018  Imm Admin: Pneumococcal Conjugate 13-Valent    02/21/2016  Imm Admin: PNEUMOCOCCAL POLYSACCHARIDE 23-VALENT    09/10/2014  Imm Admin: Pneumococcal Conjugate 13-Valent    09/08/2007  Imm Admin: PNEUMOCOCCAL POLYSACCHARIDE 23-VALENT    Only the first 5 history entries have been loaded, but more history   exists.             Up to date   RSV Vaccine Current recommendations is to have shared decision making with your PCP, please talk with your provider. RSV Vaccination: 01/12/2023 Health Maintenance Summary     This patient has no relevant Health Maintenance data.     Complete   Zoster Vaccine Healthy adults 50 years and older receive 2 doses of recombinant zoster vaccine two to six months apart (may not be covered by Medicare). Check with your pharmacy to see if you are eligible for this vaccine based on your insurance. Zoster vaccination: 01/12/2023 Health Maintenance Summary    -      Zoster Vaccines (Series Information) Completed    01/12/2023  Imm Admin: SHINGRIX-ZOSTER VACCINE   (HZV),RECOMBINANT,ADJUVANTED(IM)    05/29/2022  Imm Admin: SHINGRIX-ZOSTER VACCINE   (HZV),RECOMBINANT,ADJUVANTED(IM)    04/27/2011 Imm Admin: ZOSTAVAX - ZOSTER VACCINE, LIVE, SQ           Complete

## 2023-05-15 MED ORDER — LEVETIRACETAM 250 MG TABLET
ORAL_TABLET | 3 refills | 0 days
Start: 2023-05-15 — End: ?

## 2023-05-15 MED ORDER — CLONAZEPAM 0.5 MG TABLET
ORAL_TABLET | 0 refills | 0 days
Start: 2023-05-15 — End: ?

## 2023-05-17 ENCOUNTER — Ambulatory Visit
Admit: 2023-05-17 | Discharge: 2023-05-18 | Payer: PRIVATE HEALTH INSURANCE | Attending: Nurse Practitioner | Primary: Nurse Practitioner

## 2023-05-17 DIAGNOSIS — R252 Cramp and spasm: Principal | ICD-10-CM

## 2023-05-17 DIAGNOSIS — D500 Iron deficiency anemia secondary to blood loss (chronic): Principal | ICD-10-CM

## 2023-05-17 DIAGNOSIS — R195 Other fecal abnormalities: Principal | ICD-10-CM

## 2023-05-17 DIAGNOSIS — E538 Deficiency of other specified B group vitamins: Principal | ICD-10-CM

## 2023-05-17 LAB — CBC W/ AUTO DIFF
BASOPHILS ABSOLUTE COUNT: 0.1 10*9/L (ref 0.0–0.1)
BASOPHILS RELATIVE PERCENT: 0.8 %
EOSINOPHILS ABSOLUTE COUNT: 0 10*9/L (ref 0.0–0.5)
EOSINOPHILS RELATIVE PERCENT: 0.6 %
HEMATOCRIT: 37.7 % (ref 34.0–44.0)
HEMOGLOBIN: 12.8 g/dL (ref 11.3–14.9)
LYMPHOCYTES ABSOLUTE COUNT: 0.9 10*9/L — ABNORMAL LOW (ref 1.1–3.6)
LYMPHOCYTES RELATIVE PERCENT: 13.9 %
MEAN CORPUSCULAR HEMOGLOBIN CONC: 33.9 g/dL (ref 32.0–36.0)
MEAN CORPUSCULAR HEMOGLOBIN: 32.4 pg (ref 25.9–32.4)
MEAN CORPUSCULAR VOLUME: 95.7 fL (ref 77.6–95.7)
MEAN PLATELET VOLUME: 8 fL (ref 6.8–10.7)
MONOCYTES ABSOLUTE COUNT: 0.3 10*9/L (ref 0.3–0.8)
MONOCYTES RELATIVE PERCENT: 4.6 %
NEUTROPHILS ABSOLUTE COUNT: 5.1 10*9/L (ref 1.8–7.8)
NEUTROPHILS RELATIVE PERCENT: 80.1 %
PLATELET COUNT: 276 10*9/L (ref 150–450)
RED BLOOD CELL COUNT: 3.94 10*12/L — ABNORMAL LOW (ref 3.95–5.13)
RED CELL DISTRIBUTION WIDTH: 12.6 % (ref 12.2–15.2)
WBC ADJUSTED: 6.4 10*9/L (ref 3.6–11.2)

## 2023-05-17 LAB — VITAMIN B12: VITAMIN B-12: 262 pg/mL (ref 211–911)

## 2023-05-17 LAB — BASIC METABOLIC PANEL
ANION GAP: 7 mmol/L (ref 5–14)
BLOOD UREA NITROGEN: 10 mg/dL (ref 9–23)
BUN / CREAT RATIO: 13
CALCIUM: 10.1 mg/dL (ref 8.7–10.4)
CHLORIDE: 104 mmol/L (ref 98–107)
CO2: 25.1 mmol/L (ref 20.0–31.0)
CREATININE: 0.8 mg/dL
EGFR CKD-EPI (2021) FEMALE: 77 mL/min/{1.73_m2} (ref >=60)
GLUCOSE RANDOM: 162 mg/dL — ABNORMAL HIGH (ref 70–99)
POTASSIUM: 3.9 mmol/L (ref 3.4–4.8)
SODIUM: 136 mmol/L (ref 135–145)

## 2023-05-17 LAB — C-REACTIVE PROTEIN: C-REACTIVE PROTEIN: <4 mg/L (ref <=10.0)

## 2023-05-17 LAB — IRON PANEL
IRON SATURATION: 53 % (ref 20–55)
IRON: 154 ug/dL
TOTAL IRON BINDING CAPACITY: 292 ug/dL (ref 250–425)

## 2023-05-17 LAB — MAGNESIUM: MAGNESIUM: 1.9 mg/dL (ref 1.6–2.6)

## 2023-05-17 LAB — SEDIMENTATION RATE: ERYTHROCYTE SEDIMENTATION RATE: 31 mm/h — ABNORMAL HIGH (ref 0–30)

## 2023-05-17 LAB — FERRITIN: FERRITIN: 30 ng/mL

## 2023-05-17 NOTE — Unmapped (Signed)
Please decrease iron to 1 tab every Mon, Wed, Friday, only.    I'll be in touch via MyChart once I see you lab results.

## 2023-05-17 NOTE — Unmapped (Signed)
Hematology Follow Up Note    Primary Care Physician:   Rosine Beat, Benison Pap, DO - [facility] [place]    Reason for Visit: Anemia    Assessment/Recommendations:  Amber Duncan is a 72 y.o. Caucasian female with a history of iron deficiency anemia, dysphonia spastica, DMT2 c/b gastroparesis, GERD, COPD, fatigue, hypothyroidism, HTN, CAD, chronic UTI, hyperlipidemia, and L breast cancer, ILD who we are seeing in follow up    1. Macrocytosis - Iron deficiency:  First noted to be anemic in 2015; she received IV iron at [facility] 2017-2022, at which time she transitioned care to me. Most likely cause of iron deficiency is occult blood loss from AVMs and possibly GERD. She last received 2 infusions of Ferrlecit (500 mg total dose), in 02/2022, and takes PO iron every other day. Labs from August 2023 showed good response to IV iron. She is established with [facility] and while they recognize she likely has occult GIB, currently the risks of endoscopy and tx of recurrent AVMs outweigh the benefits, therefore we will continue with PRN Ferrlecit infusions.    From May 2022-March 2024, she had mild macrocytosis. Mild B-12 deficiency treated as below. In 11/2022 , CMP and folate normal, TSH normal. In 01/2023 celiac negative, MMP normal, monoclonal gammopathy work up negative. 01/25/23 abdominal US normal. Will not pursue additional work up at this time    Symptomatically, she continues to feel poorly with fatigue, RLS and decreased appetite. Currently taking 3 tabs daily of Senokot and Colace d/t large stool burden on recent CT Abd. Weight is stable since May 2024. Labs today show Hgb 12.8, MCV 95.7, ALC 0.9, iron sat 53%, ferritin 30, CRP <4, Sed rate 31 (slightly elevated). Given fatigue, RLS, will proceed with repeat IV iron in attempt to keep ferritin closer to 100. Iron deficit is 890.mg. If insurance allows, will plan for IV Feraheme 510 mg x 2 (1020 mg total dose). Recommend decreasing PO iron from daily to QMWF to avoid constipation.    2. Vitamin B-12 deficiency: On 10/13/21 Vitamin B-12 274. PCP recommended she start PO B-12 supplements daily. Since that time, B-12 level range 304-425. Today B-12 is 262. Continue B-12 supplements, at least 1000 mcg/daily.     RTC in 6 months for follow up labs. All questions were answered and the patient was in agreement with this plan.    Plan:  - Labs collected today: CBC, Iron, Iron Sat, TIBC, Ferritin, Sed Rate, CRP, and B12, BMP, Magnesium  - Plan for IV Feraheme 510 mg x 2 (1020 mg total dose)  - Decrease OTC PO iron supplements to QMWF  - Continue PO B-12 supplements   - Return in 6 months for repeat labs and assessment.    History of Present Illness:   Available labs date back to 2013. First noted to be anemic 2015, with intermittent anemia since that time with Hgb 9-14 (generally normocytic). Since 2017 she has been receiving Venofer injections intermittently via [facility] and taking PO iron daily. She has some constipation requiring suppositories, but otherwise tolerates the iron ok.    She was seen by [facility] in 2017. Capsule endoscopy showed AVM in small bowel with subsequent EGD and tx of that AVM. In 10/2019 she had an episode of GIB with Hgb 7.5. Colonoscopy and EGD at that time again showed bleeding AVMs, thought to be exacerbated by anticoagulation after cardiac stent placement. Multiple AVMs were cauterized, she received 1 unit PRBCs and  IV iron infusions. Has had intermittent iron deficiency anemia since that time (Hgb 9-12). Most recent labs on 05/15/21 show Hgb 12.1, MCV 96.5, plt 288, WBC 7, ferritin 28.7, iron 85, iron sat 24. Sed rate, CRP n/a. B12 and folate have been normal historically. She is no longer on anticoagulation, does take 1 baby ASA daily.     She is post-menopausal, had a hysterectomy at 48, has had no vaginal bleeding since that time.She eats a varied diet that includes: chicken or tuna 3-5x/week, beans, leafy green vegetables. Does not have s/sx of celiac disease or inflammatory bowel disease, and does not have a hx of bariatric surgery. She is not a long distance runner or regular blood donor. She does have Hx of hepatitis, reportedly treated.    She reports ongoing acid reflux and mucousy yellow stools which she associates with chronic abx for UTI. When bowels do move they are dark, which she associates with the iron. Otherwise denies known GI bleeding (melena, hematochezia).  She denies history of bleeding complications with prior surgery, excessive mucosal bleeding or abnormal bruising.  Her energy level and concentration has been poor.  She denies dietary restrictions. She admits hair loss or brittle nails. She admits pica (ice, pretzels, popcorn). She admits shortness of breath with exertion or at rest, dizziness, syncope, chest pain. She denies RLS.    Received 500 mg Ferrlecit May/June 2023 and tolerated it well. She is taking PO iron daily. Does have some cramping, fecal urgency, constipation. Reports occasional black BM, occasionally bloody which she thinks is from hemorrhoids. She saw GI in June, who suspect there is occult GIB r/t AVM, risks of endoscopy outweight benefits though.     She continues to have fatigue and feels poorly overall, weak legged.. Didn't have much improvement in symptoms when attempted to keep ferritin >100. She was recently diagnosed with ILD and is following closely with Pulm.  She reports decreased appetite and she's lost 25-30 lbs in the past 3 months. She has been unable to tolerate solid food. She has intermittent abdominal tenderness.    Interval Hx:  Continues to feel poorly with fatigue and brain fog. Also with RLS and peripheral numbness/tingling. Denies CP or SOB. Is taking PO iron daily. She has been struggling with constipation, per recent CT and has been taking Senokot and colace daily. She denies black bowel movements, but does report intermittent blood on tissue, that she a/w rectal irritation. She has a GI appt scheduled in October at [facility], but she is unsure if she is going to keep this appt. I encouraged her to keep it.       Visit Diagnoses:  1. Iron deficiency anemia due to chronic blood loss    2. Vitamin B12 deficiency without anemia    3. Muscle cramps    4. Loose stools          Medical History:  Past Medical History:   Diagnosis Date    Acute upper GI bleed 11/03/2019    Breast cancer (CMS-HCC)     Chronic UTI     COPD (chronic obstructive pulmonary disease) (CMS-HCC)     Coronary artery disease     Diabetes (CMS-HCC)     Disease of thyroid gland     Eye trauma 06/17/2022    injury due to a fall on right eye    Fatigue     Hepatitis     Hepatitis     Hyperlipidemia     Hypertension  Migraine     Sleep apnea        Surgical History:  Past Surgical History:   Procedure Laterality Date    BACK SURGERY      BREAST BIOPSY Left 11/2019    positive    BREAST LUMPECTOMY Left 01/2020    CATARACT EXTRACTION      CHOLECYSTECTOMY      CORONARY STENT PLACEMENT      HYSTERECTOMY      KNEE SURGERY      PR COLONOSCOPY,ABLATE LESION N/A 01/21/2017    Procedure: COLONOSCOPY FLEX; W/ABLAT LES NOT AMENABLE-SNARE;  Surgeon: Luanne Bras, MD;  Location: [facility] PROCEDURES [facility];  Service: Gastroenterology    PR COLSC FLX W/RMVL OF TUMOR POLYP LESION SNARE TQ N/A 01/21/2017    Procedure: COLONOSCOPY FLEX; W/REMOV TUMOR/LES BY SNARE;  Surgeon: Luanne Bras, MD;  Location: [facility];  Service: Gastroenterology    PR MASTECTOMY, PARTIAL Left 01/19/2020    Procedure: MASTECTOMY, PARTIAL (EG, LUMPECTOMY, TYLECTOMY, QUADRANTECTOMY, SEGMENTECTOMY);  Surgeon: Talbert Cage, DO;  Location: MAIN OR [facility];  Service: Surgical Oncology Breast    PR UPPER GI ENDOSCOPY,BIOPSY N/A 04/29/2018    Procedure: UGI ENDOSCOPY; WITH BIOPSY, SINGLE OR MULTIPLE;  Surgeon: Rona Ravens, MD;  [facility];  Service: Gastroenterology    RADIATION Left     REDUCTION MAMMAPLASTY Bilateral     just a lift    REPLACEMENT TOTAL KNEE Right        No history of bleeding complications with past surgeries    Social History:  Social History     Socioeconomic History    Marital status: Divorced     Spouse name: None    Number of children: None    Years of education: None    Highest education level: None   Tobacco Use    Smoking status: Former     Current packs/day: 0.00     Average packs/day: 2.0 packs/day for 48.0 years (96.0 ttl pk-yrs)     Types: Cigarettes     Start date: 11/06/1963     Quit date: 11/06/2011     Years since quitting: 11.5     Passive exposure: Past    Smokeless tobacco: Never   Vaping Use    Vaping status: Never Used   Substance and Sexual Activity    Alcohol use: No    Drug use: No    Sexual activity: Not Currently   Social History Narrative    She lives in [place]. 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 She denies alcohol and illicit drug use.       Social Determinants of Health     Financial Resource Strain: Low Risk  (01/28/2023)    Overall Financial Resource Strain (CARDIA)     Difficulty of Paying Living Expenses: Not hard at all   Food Insecurity: No Food Insecurity (01/28/2023)    Hunger Vital Sign     Worried About Running Out of Food in the Last Year: Never true     Ran Out of Food in the Last Year: Never true   Transportation Needs: No Transportation Needs (01/28/2023)    PRAPARE - Therapist, art (Medical): No     Lack of Transportation (Non-Medical): No   Physical Activity: Inactive (09/09/2017)    Received from [facility], [facility]    Exercise Vital Sign  Days of Exercise per Week: 0 days     Minutes of Exercise per Session: 0 min   Stress: No Stress Concern Present (09/09/2017)    Received from [facility], [facility]    [facility] of Occupational Health - Occupational Stress Questionnaire     Feeling of Stress : Only a little   Social Connections: Somewhat Isolated (09/09/2017)    Received from [facility], [facility]    Social Connection and Isolation Panel [NHANES]     Frequency of Communication with Friends and Family: More than three times a week     Frequency of Social Gatherings with Friends and Family: Once a week     Attends Religious Services: More than 4 times per year     Active Member of [employer] or Organizations: No     Attends Banker Meetings: Never     Marital Status: Divorced       Amber Duncan was born in [place] and currently lives in [place], alone. Retired, worked as a Copy at [employer], and in Designer, fashion/clothing. Still occasionally cleans houses. Hobbies include seeing live music, going to the beach, going out to eat.     Tobacco history: Former, quit 9 years ago. Smoked for 45 years prior to that.  Alcohol history: Denies  Illicit drug history: Denies    Family History:  family history includes Breast cancer in her maternal aunt; Cancer in her brother, brother, maternal aunt, and maternal uncle; Diabetes in her paternal uncle; No Known Problems in her brother, daughter, maternal grandfather, maternal grandmother, paternal grandfather, paternal grandmother, sister, son, and another family member; Other in her father, mother, sister, and sister.     Sister with iron deficiency anemia. No family hx of bleeding or clotting disorders    Allergies:  Patient has no known allergies.    Medications:   Current Outpatient Medications   Medication Sig Dispense Refill    aspirin (ECOTRIN) 81 MG tablet Take 1 tablet (81 mg total) by mouth daily.      blood sugar diagnostic (ONETOUCH ULTRA TEST) Strp by Other route daily before breakfast. Use as directed. 100 each 1    cephalexin (KEFLEX) 250 MG capsule TAKE ONE CAPSULE DAILY FOR UTI PREVENTION 30 capsule 11    cholecalciferol, vitamin D3, (VITAMIN D3 ORAL) Take by mouth.      clonazePAM (KLONOPIN) 0.5 MG tablet TAKE THREE TABLETS BY MOUTH AS DIRECTED 90 tablet 0    docusate sodium (COLACE) 100 MG capsule Take 1 capsule (100 mg total) by mouth daily as needed.      empty container (SHARPS-A-GATOR DISPOSAL SYSTEM) Misc Use as directed for sharps disposal 1 each 2    evolocumab (REPATHA SURECLICK) 140 mg/mL PnIj Inject the contents of 1 pen (140 mg) under the skin every fourteen (14) days. 2 mL 11    ferrous gluconate 324 MG tablet Take 1 tablet (324 mg total) by mouth once.      FLUoxetine (PROZAC) 40 MG capsule Take 1 capsule (40 mg total) by mouth daily. 90 capsule 3    levETIRAcetam (KEPPRA) 1000 MG tablet TAKE 1 TABLET BY MOUTH 2 TIMES DAILY 180 tablet 3    levETIRAcetam (KEPPRA) 250 MG tablet Take 1 tablet (250 mg total) by mouth two (2) times a day. 180 tablet 3    lisinopril (PRINIVIL,ZESTRIL) 5 MG tablet Take 1 tablet (5 mg total) by mouth daily. 90 tablet 3    metFORMIN (GLUCOPHAGE) 500 MG tablet Take 2  tablets (1,000 mg total) by mouth in the morning and 2 tablets (1,000 mg total) in the evening. Take with meals. 360 tablet 1    omeprazole 20 mg TbLD Take by mouth nightly as needed.      traZODone (DESYREL) 50 MG tablet Take 1/2 to 1 tab at bedtime as directed 30 tablet 11    vitamin E acetate (VITAMIN E ORAL) Take by mouth.      SYNTHROID 100 mcg tablet Take 1 tablet (100 mcg total) by mouth daily. 90 tablet 3     No current facility-administered medications for this visit.       Review of Systems:  As per HPI, otherwise negative x 12 systems.    Objective :  Vitals:    05/17/23 1115   BP: 156/97   BP Site: L Arm   BP Position: Sitting   Pulse: 100   Temp: 36.3 ??C (97.3 ??F)   TempSrc: Temporal   SpO2: 95%   Weight: 73.9 kg (163 lb)   Height: 167.6 cm (5' 5.98)           Physical Exam:  GEN: Chronically ill-appearing female in no acute distress.  HEENT: Normocephalic, no conjunctival injection, sclera anicteric. No cervical or supraclavicular LAD  NEURO: A&Ox3, CNII-XII grossly intact.  PSY: Appropriate affect, reasonable insight and judgment.  ABD: Soft abdomen, with tenderness to palpation RUQ and gastric region Test Results  Lab Results   Component Value Date    WBC 6.4 05/17/2023    HGB 12.8 05/17/2023    HCT 37.7 05/17/2023    PLT 276 05/17/2023          Lab Results   Component Value Date    IRON 154 05/17/2023    TIBC 292 05/17/2023    FERRITIN 30.0 05/17/2023        Results for orders placed or performed in visit on 05/17/23   Basic Metabolic Panel   Result Value Ref Range    Sodium 136 135 - 145 mmol/L    Potassium 3.9 3.4 - 4.8 mmol/L    Chloride 104 98 - 107 mmol/L    CO2 25.1 20.0 - 31.0 mmol/L    Anion Gap 7 5 - 14 mmol/L    BUN 10 9 - 23 mg/dL    Creatinine 1.61 0.96 - 1.02 mg/dL    BUN/Creatinine Ratio 13     eGFR CKD-EPI (2021) Female 75 >=60 mL/min/1.43m2    Glucose 162 (H) 70 - 99 mg/dL    Calcium 04.5 8.7 - 40.9 mg/dL   Magnesium Level   Result Value Ref Range    Magnesium 1.9 1.6 - 2.6 mg/dL   Vitamin W11 Level   Result Value Ref Range    Vitamin B-12 262 211 - 911 pg/ml   Iron Panel   Result Value Ref Range    Iron 154 50 - 170 ug/dL    TIBC 914 782 - 956 ug/dL    Iron Saturation (%) 53 20 - 55 %   Sedimentation Rate   Result Value Ref Range    Sed Rate 31 (H) 0 - 30 mm/h   C-reactive protein   Result Value Ref Range    CRP <4.0 <=10.0 mg/L   Ferritin   Result Value Ref Range    Ferritin 30.0 7.3 - 270.7 ng/mL   CBC w/ Differential   Result Value Ref Range    WBC 6.4 3.6 - 11.2 10*9/L    RBC 3.94 (L) 3.95 -  5.13 10*12/L    HGB 12.8 11.3 - 14.9 g/dL    HCT 84.1 32.4 - 40.1 %    MCV 95.7 77.6 - 95.7 fL    MCH 32.4 25.9 - 32.4 pg    MCHC 33.9 32.0 - 36.0 g/dL    RDW 02.7 25.3 - 66.4 %    MPV 8.0 6.8 - 10.7 fL    Platelet 276 150 - 450 10*9/L    Neutrophils % 80.1 %    Lymphocytes % 13.9 %    Monocytes % 4.6 %    Eosinophils % 0.6 %    Basophils % 0.8 %    Absolute Neutrophils 5.1 1.8 - 7.8 10*9/L    Absolute Lymphocytes 0.9 (L) 1.1 - 3.6 10*9/L    Absolute Monocytes 0.3 0.3 - 0.8 10*9/L    Absolute Eosinophils 0.0 0.0 - 0.5 10*9/L    Absolute Basophils 0.1 0.0 - 0.1 10*9/L       Glendell Docker, NP  [facility]

## 2023-05-18 MED ORDER — CLONAZEPAM 0.5 MG TABLET
0 refills | 0 days | Status: CP
Start: 2023-05-18 — End: ?

## 2023-05-18 MED ORDER — LEVETIRACETAM 250 MG TABLET
ORAL_TABLET | 3 refills | 0 days | Status: CP
Start: 2023-05-18 — End: ?

## 2023-05-21 NOTE — Unmapped (Signed)
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

## 2023-05-21 NOTE — Unmapped (Signed)
Patient AWV scheduled for 10/28 at 2:10pm @Primary  Care at [facility] with [facility]    Abstraction Result Flowsheet Data    Reason for Encounter  Reason for Encounter: Outreach  Primary Reason for Outreach: AWV  Text Message: No  MyChart Message: No  Outreach Call Outcome: Scheduled Telephone

## 2023-05-25 ENCOUNTER — Ambulatory Visit: Admit: 2023-05-25 | Discharge: 2023-05-26 | Payer: PRIVATE HEALTH INSURANCE | Attending: Neurology | Primary: Neurology

## 2023-05-25 DIAGNOSIS — R2689 Other abnormalities of gait and mobility: Principal | ICD-10-CM

## 2023-05-25 DIAGNOSIS — Z9181 History of falling: Principal | ICD-10-CM

## 2023-05-25 NOTE — Unmapped (Signed)
[address]  [facility] NEUROLOGY CLINIC [place] [address] [place]  [address]  [place] [address]  [phone]    Date: 05/25/2023  Patient Name: Amber Duncan  MRN: [id number]  PCP: Amber Duncan  Amber Milliner, MD    Assessment:      Amber Duncan is a 72 y.o. female who  has a past medical history of Acute upper GI bleed (11/03/2019), Breast cancer (CMS-HCC), Chronic UTI, COPD (chronic obstructive pulmonary disease) (CMS-HCC), Coronary artery disease, Diabetes (CMS-HCC), Disease of thyroid gland, Eye trauma (06/17/2022), Fatigue, Hepatitis, Hepatitis, Hyperlipidemia, Hypertension, Migraine, and Sleep apnea. 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     Cervical level Spinal myoclonus: She feels no change though I feel her symptoms were improved today as she was able to speak through most of the visit. I am concerned that the medications are causing her to be more drowsy and fatigued, but she is hesitant to reduce the doses in case spasms worsen again. We will try rearranging doses to have larger Clonazepam dose in the evening and lower dose in the morning. We discussed voice amplification to help with embarrassement when others cannot hear her in public.    Imbalance: no clear cerebellar ataxia but she appears unsteady on her feet with turns, and has had a few falls. She ascribes this at least partially to her fatigue. I offered PT for balance, she is concerned about cost and driving distance, I provided her with a physical referral to take to a local PT she likes.       Plan:      Patient Instructions   Let's try rearranging your medication doses to see if this lessens the sleepiness. Please take 1 tab of Clonazepam in the morning, and 2 tabs at night  Consider owning a microphone device or voice amplifier that you can use in loud restaurants or other situations where others may have trouble hearing you. You can find these at [employer], [employer], or [employer]  I will refer you for physical therapy for balance and walking. I will print a prescription to give you to take where you like to go  Follow up in 6 months    Health education/[place] literacy:   Patient education was provided. Reviewed the differential diagnosis, plan of care in details as well as other elements as details above. The benfits and risks of each procedure and medications were discussed with the patient. Alternative options were reviewed with the paient.     Total face - to - face time included 30 minutes > 50 % in direct consultation reviewing details of history, examination and data findings , discussing my clinical reasoning and clinical impression, as well as a review of my recommendations for a plan of treatment as documented above. Additional time was provided to address alternative options of care, health literacy regarding the differential diagnosis, testing and medication options, the benefits of current plan as well as an opportunity to address all the patient???s questions and concerns. Rehabilitation services, including physical therapy, occupational therapy, speech therapy, independent cardiovascular exercise, and group exercise classes, were considered and discussed with the patient. I spent another 10 minutes in non face to face patient care on the date of this visit.    A written summary was provided to the patient at the time of the visit, as well as instructions on how to access My Chart. My contact information was  provided along with instructions on ow to reach the administrative office and the clinic.        Subjective:      HPI: Amber Duncan is a 72 y.o. Caucasian right handed female who presents for follow up to the [place] [facility] Neurology Outpatient Department for follow up of myoclonus of chest and breathing muscles. Please review my initial clinic visit note dated on 06/17/16 for details regarding history of present illness.    Briefly, Her symptoms began in 2014 without clear inciting event. Initially symptoms were mild and intermittent, but she soon developed quite severe symptoms of breathlessness, and difficulty controlling her voice, as well as the feeling that she was being squeezed in her upper abdomen. She reports that an MRI of her brain 3 years ago at onset was read as normal. She had an EMG for concern for myasthenia gravis, which was negative. Clonazepam was tried at 0.5 mg qhs, she could not tolerate this and it did not seem to help her symptoms. She had been on Gabapentin for headaches, this did not help headaches or breathing issues so was stopped. She was seen by Dr. Vergie Living in ENT, where examination did not reveal evidence of vocal cord dystonia. He referred her to movement disorders for evaluation. She denies history of exposure to anti-dopaminergic medications. Chest fluoroscopic evaluation 06/18/16 with sniff test revealed no abnormal or paradoxical movements of the chest wall at rest, with deep breathing, during speech, or while sniffing (I was present for this test). MRIc-spine demonstrated DJD, significant at C4-5, without impingement of cord or cord signal abnormality (images reviewed by me, also reviewed together with neuroradiology). Initially, Clonazepam improved her symptoms dramatically. However this stopped being effective, despite escalating dose. I switched her to Keppra late February 2018. This helped initially. She developed worsened symptoms along with generalized malaise, weight loss/nausea, bone pain of unclear etiology summer 2018, she had very extensive medical workup including infectious, endocrine, rheumatological, and neoplastic. I sent tic-borne illness panel which revealed Erlichiosis, we started treatment for this which produced partial improvement of her general symptoms. We weaned off Clonazepam and increased Keppra to 500 mg BID which helped initially, but symptoms returned so we restarted Clonazepam. I offered her methylphenidate 5mg  for her extreme fatigue, but this caused elevated BP so was stopped. I suggested she see her sleep doctor to investigate fatigue further. Had a RCA cardiac stent placed November 2020 and was diagnosed with breast cancer March 2021, underwent surgical lumpectomy 01/2020 and XRT starting late June 2021. She continues to have flares in her symptoms at times requiring adjustments to her medications.    Interim History: She did sustain an orbital fracture in October 2023, no surgical intervention was recommended. She had another fall in January 2024, caught herself with the left hand and fractured her left wrist. She did PT and the wrist is doing well.    She continues to have alternating constipation and loose stool. Her PCP sent her for CT abdomen which demonstrates she was constipated. She had trouble getting an appointment at [facility], she now has an appointment elsewhere on October 3rd. Her PCP recommended senna and dulcolax daily, she takes three tabs of each daily and this has allowed her to be regular again.    Her breathing and speech are doing ok. If she talks for a prolonged period of time, she starts to lose her breath. She was recently diagnosed with interstitial lung disease, she is scheduled for  repeat PFTs soon, and monitors her oxygen at home.    She is back on iron infusions, her ferritin is 30.    Current medications for myoclonus:  Keppra 1000 mg + 250 mg (1250mg ) BID  Clonazepam 0.5 mg tabs 1.5 tabs BID    SE: She is quite tired during the day. She skips am doses of Clonazepam and Keppra if she is driving somewhere    Past Medical Hx:    Past Medical History:   Diagnosis Date    Acute upper GI bleed 11/03/2019    Breast cancer (CMS-HCC)     Chronic UTI     COPD (chronic obstructive pulmonary disease) (CMS-HCC)     Coronary artery disease     Diabetes (CMS-HCC)     Disease of thyroid gland     Eye trauma 06/17/2022    injury due to a fall on right eye    Fatigue     Hepatitis     Hepatitis     Hyperlipidemia     Hypertension     Migraine     Sleep apnea        Past Surgical Hx:    Past Surgical History:   Procedure Laterality Date    BACK SURGERY      BREAST BIOPSY Left 11/2019    positive    BREAST LUMPECTOMY Left 01/2020    CATARACT EXTRACTION      CHOLECYSTECTOMY      CORONARY STENT PLACEMENT      HYSTERECTOMY      KNEE SURGERY      PR COLONOSCOPY,ABLATE LESION N/A 01/21/2017    Procedure: COLONOSCOPY FLEX; W/ABLAT LES NOT AMENABLE-SNARE;  Surgeon: Luanne Bras, MD;  [facility];  Service: Gastroenterology    PR COLSC FLX W/RMVL OF TUMOR POLYP LESION SNARE TQ N/A 01/21/2017    Procedure: COLONOSCOPY FLEX; W/REMOV TUMOR/LES BY SNARE;  Surgeon: Luanne Bras, MD;  Location: [facility];  Service: Gastroenterology    PR MASTECTOMY, PARTIAL Left 01/19/2020    Procedure: MASTECTOMY, PARTIAL (EG, LUMPECTOMY, TYLECTOMY, QUADRANTECTOMY, SEGMENTECTOMY);  Surgeon: Talbert Cage, DO;  Location: MAIN OR [facility];  Service: Surgical Oncology Breast    PR UPPER GI ENDOSCOPY,BIOPSY N/A 04/29/2018    Procedure: UGI ENDOSCOPY; WITH BIOPSY, SINGLE OR MULTIPLE;  Surgeon: Rona Ravens, MD;  [facility];  Service: Gastroenterology    RADIATION Left     REDUCTION MAMMAPLASTY Bilateral     just a lift    REPLACEMENT TOTAL KNEE Right        Social Hx:    Social History     Socioeconomic History    Marital status: Divorced     Spouse name: None    Number of children: None    Years of education: None    Highest education level: None   Tobacco Use    Smoking status: Former     Current packs/day: 0.00     Average packs/day: 2.0 packs/day for 48.0 years (96.0 ttl pk-yrs)     Types: Cigarettes     Start date: 11/06/1963     Quit date: 11/06/2011     Years since quitting: 11.5     Passive exposure: Past    Smokeless tobacco: Never   Vaping Use    Vaping status: Never Used Substance and Sexual Activity    Alcohol use: No    Drug use: No    Sexual activity: Not Currently  Social History Narrative    She lives in [place]. 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 She denies alcohol and illicit drug use.       Social Determinants of Health     Financial Resource Strain: Low Risk  (01/28/2023)    Overall Financial Resource Strain (CARDIA)     Difficulty of Paying Living Expenses: Not hard at all   Food Insecurity: No Food Insecurity (01/28/2023)    Hunger Vital Sign     Worried About Running Out of Food in the Last Year: Never true     Ran Out of Food in the Last Year: Never true   Transportation Needs: No Transportation Needs (01/28/2023)    PRAPARE - Therapist, art (Medical): No     Lack of Transportation (Non-Medical): No   Physical Activity: Inactive (09/09/2017)    Received from [facility]    Exercise Vital Sign     Days of Exercise per Week: 0 days     Minutes of Exercise per Session: 0 min   Stress: No Stress Concern Present (09/09/2017)    Received from [facility]    [facility] of Occupational Health - Occupational Stress Questionnaire     Feeling of Stress : Only a little   Social Connections: Somewhat Isolated (09/09/2017)    Received from [facility]    Social Connection and Isolation Panel [NHANES]     Frequency of Communication with Friends and Family: More than three times a week     Frequency of Social Gatherings with Friends and Family: Once a week     Attends Religious Services: More than 4 times per year     Active Member of [employer] or Organizations: No     Attends Engineer, structural: Never     Marital Status: Divorced       Family Hx:    Family History   Problem Relation Age of Onset    Other Mother         MVA    Other Father         MVA    No Known Problems Sister     Other Sister         MVA    Other Sister         MVA drunk driver    No Known Problems Daughter     No Known Problems Maternal Grandmother     No Known Problems Maternal Grandfather     No Known Problems Paternal Grandmother     No Known Problems Paternal Grandfather     Cancer Brother         liver    Cancer Brother         Lung    No Known Problems Brother     No Known Problems Son     Breast cancer Maternal Aunt     Cancer Maternal Aunt     Cancer Maternal Uncle     Diabetes Paternal Uncle     No Known Problems Other     Heart failure Neg Hx     BRCA 1/2 Neg Hx     Colon cancer Neg Hx     Endometrial cancer Neg Hx     Ovarian cancer Neg Hx     Glaucoma Neg Hx     Macular degeneration Neg Hx  ALLERGIES:  No Known Allergies    CURRENT MEDICATIONS:    Current Outpatient Medications   Medication Sig Dispense Refill    aspirin (ECOTRIN) 81 MG tablet Take 1 tablet (81 mg total) by mouth daily.      blood sugar diagnostic (ONETOUCH ULTRA TEST) Strp by Other route daily before breakfast. Use as directed. 100 each 1    cephalexin (KEFLEX) 250 MG capsule TAKE ONE CAPSULE DAILY FOR UTI PREVENTION 30 capsule 11    cholecalciferol, vitamin D3, (VITAMIN D3 ORAL) Take by mouth.      clonazePAM (KLONOPIN) 0.5 MG tablet TAKE THREE TABLETS BY MOUTH AS DIRECTED 90 tablet 0    docusate sodium (COLACE) 100 MG capsule Take 1 capsule (100 mg total) by mouth daily as needed.      empty container (SHARPS-A-GATOR DISPOSAL SYSTEM) Misc Use as directed for sharps disposal 1 each 2    evolocumab (REPATHA SURECLICK) 140 mg/mL PnIj Inject the contents of 1 pen (140 mg) under the skin every fourteen (14) days. 2 mL 11    ferrous gluconate 324 MG tablet Take 1 tablet (324 mg total) by mouth once.      FLUoxetine (PROZAC) 40 MG capsule Take 1 capsule (40 mg total) by mouth daily. 90 capsule 3    levETIRAcetam (KEPPRA) 1000 MG tablet TAKE 1 TABLET BY MOUTH 2 TIMES DAILY 180 tablet 3    levETIRAcetam (KEPPRA) 250 MG tablet TAKE ONE (1) TABLET BY MOUTH TWO TIMES PER DAY 180 tablet 3    lisinopril (PRINIVIL,ZESTRIL) 5 MG tablet Take 1 tablet (5 mg total) by mouth daily. 90 tablet 3    metFORMIN (GLUCOPHAGE) 500 MG tablet Take 2 tablets (1,000 mg total) by mouth in the morning and 2 tablets (1,000 mg total) in the evening. Take with meals. 360 tablet 1    omeprazole 20 mg TbLD Take by mouth nightly as needed.      SYNTHROID 100 mcg tablet Take 1 tablet (100 mcg total) by mouth daily. 90 tablet 3    traZODone (DESYREL) 50 MG tablet Take 1/2 to 1 tab at bedtime as directed 30 tablet 11    vitamin E acetate (VITAMIN E ORAL) Take by mouth.       No current facility-administered medications for this visit.       Review of Systems:  A 10-systems review was performed and, unless otherwise noted, declared negative by patient.        Objective:      Objective [] Expand by Default     Physical Exam:  Height 5'6'', weight 166 lbs.      Sitting BP 143/97 HR 86  Standing: BP 152/98 HR 89    General:     Neurological Examination:     Motor: symmetric bulk throughout. No fasciculations.       MOVEMENT DISORDERS EXAMINATION:   Facial expression was normal. Speech is minimally strained today, more strained towards end of the visit. Could hold AAAH no longer than 2 seconds before onset of straining.  There was no evidence of tremor. She had difficulty getting up from a low lying chair without help of both hands but was able to rise after 2 attempts. Gait was steady with good heel to toe stride, stride length was normal, turns are unsteady. Speed was normal. Arm swing was normal bilaterally.     Gait: See above      Diagnostic Studies and Review of Records:      Studies reviewed:  MRI  C-spine: multilevel DJD, most prominent at C4-5, no cord signal abnormality to my eye, and reviewed with neuroradiologist    Fluoroscopic chest evaluation with sniff test: no abnormal movement of diaphragm or other chest wall structures with rest, normal breathing, deep breathing, speech, or sniffing    Ehrlichia antibodies: 1:256 04/2017 and SAME on repeat 05/2017    ACTH stimulation test 07/2017: normal    Cortisol: normal    TSH: 0.50 (slightly low)

## 2023-05-25 NOTE — Unmapped (Signed)
Amber Duncan has been contacted in regards to their refill of repatha. At this time, they have declined refill due to patient having 2 doses remaining. Refill assessment call date has been updated per the patient's request for 1.5- 2 weeks out

## 2023-05-25 NOTE — Unmapped (Addendum)
Let's try rearranging your medication doses to see if this lessens the sleepiness. Please take 1 tab of Clonazepam in the morning, and 2 tabs at night  Consider owning a microphone device or voice amplifier that you can use in loud restaurants or other situations where others may have trouble hearing you. You can find these at [employer], [employer], or [employer]  I will refer you for physical therapy for balance and walking. I will print a prescription to give you to take where you like to go  Follow up in 6 months

## 2023-06-04 DIAGNOSIS — I2510 Atherosclerotic heart disease of native coronary artery without angina pectoris: Principal | ICD-10-CM

## 2023-06-04 MED ORDER — REPATHA SURECLICK 140 MG/ML SUBCUTANEOUS PEN INJECTOR
SUBCUTANEOUS | 11 refills | 28 days | Status: CP
Start: 2023-06-04 — End: ?
  Filled 2023-07-12: qty 2, 28d supply, fill #0

## 2023-06-04 NOTE — Unmapped (Signed)
Refill request received for patient.      Medication Requested: repatha  Last Office Visit: Visit date not found   Next Office Visit: Visit date not found  Last Prescriber: Ramond Marrow    Nurse refill requirements met? No  If not met, why: needs office visit    Sent to: Provider for signing  If sent to provider, which provider?: Ramond Marrow

## 2023-06-04 NOTE — Unmapped (Signed)
[facility] Refill Coordination Note    Specialty Lite Medication(s) to be Shipped:   Repatha    Other medication(s) to be shipped: No additional medications requested for fill at this time     Amber Duncan, DOB: May 25, 1951  Phone: [phone] (home) [phone] (work)      All above HIPAA information was verified with patient.     Was a Nurse, learning disability used for this call? No    Changes to medications: Amber Duncan reports no changes at this time.  Changes to insurance: No      REFERRAL TO PHARMACIST     Referral to the pharmacist: Not needed      [facility]     Shipping address confirmed in Epic.     Delivery Scheduled: Yes, Expected medication delivery date: 06/15/23.  However, Rx request for refills was sent to the provider as there are none remaining.     Medication will be delivered via Same Day Courier to the prescription address in Epic WAM.    Moshe Salisbury   [facility] Specialty Technician

## 2023-06-11 ENCOUNTER — Other Ambulatory Visit: Payer: Self-pay | Admitting: Internal Medicine

## 2023-06-11 DIAGNOSIS — R1319 Other dysphagia: Secondary | ICD-10-CM

## 2023-06-11 DIAGNOSIS — K219 Gastro-esophageal reflux disease without esophagitis: Secondary | ICD-10-CM

## 2023-06-15 ENCOUNTER — Ambulatory Visit
Admission: RE | Admit: 2023-06-15 | Discharge: 2023-06-15 | Disposition: A | Discharge status: Home / Self Care | Payer: PPO | Source: Ambulatory Visit | Attending: Internal Medicine | Admitting: Internal Medicine

## 2023-06-15 DIAGNOSIS — I2510 Atherosclerotic heart disease of native coronary artery without angina pectoris: Principal | ICD-10-CM

## 2023-06-15 DIAGNOSIS — R1319 Other dysphagia: Secondary | ICD-10-CM | POA: Insufficient documentation

## 2023-06-15 DIAGNOSIS — K219 Gastro-esophageal reflux disease without esophagitis: Secondary | ICD-10-CM | POA: Insufficient documentation

## 2023-06-15 NOTE — Unmapped (Signed)
Amber Duncan 's Repatha shipment will be delayed as a result of a high copay.     I have reached out to the patient  at [phone]  and left a voicemail message.  We will wait for a call back from the patient to reschedule the delivery.  We have not confirmed the new delivery date.

## 2023-06-16 MED ORDER — CLONAZEPAM 0.5 MG TABLET
ORAL_TABLET | 0 refills | 0 days
Start: 2023-06-16 — End: ?

## 2023-06-17 NOTE — Unmapped (Signed)
MEGGEN ANKENBAUER 's Repatha shipment will be canceled as a result of patient unable to afford copay at this time. Previous grant expired, all grants fully allocated / closed at this time.  I have spoken with the patient  at [phone]  and communicated the delay. We will not reschedule the medication and have removed this/these medication(s) from the work request.  We have canceled this work request.

## 2023-06-18 MED ORDER — CLONAZEPAM 0.5 MG TABLET
ORAL_TABLET | 0 refills | 0 days | Status: CP
Start: 2023-06-18 — End: ?

## 2023-06-27 NOTE — Unmapped (Signed)
Reason for consult: I am seeing Amber Duncan for interstitial lung disease and advice on diagnostic testing and treatment options      HPI   Presenting hx: The patient is a 72 yo with Pmhx of tobacco abuse (45 years x 2 ppd, quit 2013), L breast cancer s/p lumpectomy and xrt,  who presented with a complaint of abnormal chest CT and DOE over the preceding several months. Patient states she has been in a cancer screening program for the last 5-6 years and they had been following lung nodules. This year, a nodule was enlarging and there was evidence of worsening fibrosis in the lung. Patient states she used to have periodic shortness of breath where she feels like she is suffocating and having chest tightness (squeezing around the diaphragm). Patient states she get short of breath when walking which was also made worse by leg weakness. Nothing seems to improve the breathing. Patient was diagnosed with cervical spine myoclonus and has related severe chest discomfort and voice changes. She is on Keppra and klonopin which doesn't seem to help. She coughs most days and is both non-productive and productive. Sputum is clear. No hemoptysis. Minimal LE edema but does get orthopnea. No noticeable weight changes. She first noticed issues with her breathing and chest tightness 7 years ago and has been getting progressively worse.   She denies any prior history of lung problems. She is a former smoker and smoked 2ppd x 45 years. Quit 2013. No vaping or e-cigarettes. No marijuana use. She is retired. 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 Lives in [place], [place] in a mobile home. No known water damage or mold. She has lived there for 7 years. No pets. No birds. She had breast cancer 2 years ago and is s/p lumpectomy and 20 rounds of radiation. No chemotherapy. She is in remission. She endorsed fatigue, pain, dysphagia, dry mouth, GERD. No organic antigen exposures.     Since last clinic visit, the patient reports she has been doing about the same. She still struggles with her voice. She has follow up with neuro soon for cervical spine myoclonus. She has been getting evaluated for esophageal dysmotility by GI, now on PPI. No further weight loss.       PMH   Past Medical History:   Diagnosis Date    Acute upper GI bleed 11/03/2019    Breast cancer (CMS-HCC)     Chronic UTI     COPD (chronic obstructive pulmonary disease) (CMS-HCC)     Coronary artery disease     Diabetes (CMS-HCC)     Disease of thyroid gland     Eye trauma 06/17/2022    injury due to a fall on right eye    Fatigue     Hepatitis     Hepatitis     Hyperlipidemia     Hypertension     Migraine     Sleep apnea            FH   - Brother with lung cancer in remission  - Uncle with lung cancer   - No family history of lung fibrosis. Parents died young in a car wreck       Family History   Problem Relation Age of Onset    Other Mother  MVA    Other Father         MVA    No Known Problems Sister     Other Sister         MVA    Other Sister         MVA drunk driver    No Known Problems Daughter     No Known Problems Maternal Grandmother     No Known Problems Maternal Grandfather     No Known Problems Paternal Grandmother     No Known Problems Paternal Grandfather     Cancer Brother         liver    Cancer Brother         Lung    No Known Problems Brother     No Known Problems Son     Breast cancer Maternal Aunt     Cancer Maternal Aunt     Cancer Maternal Uncle     Diabetes Paternal Uncle     No Known Problems Other     Heart failure Neg Hx     BRCA 1/2 Neg Hx     Colon cancer Neg Hx     Endometrial cancer Neg Hx     Ovarian cancer Neg Hx     Glaucoma Neg Hx     Macular degeneration Neg Hx           Social History     Socioeconomic History    Marital status: Divorced   Tobacco Use Smoking status: Former     Current packs/day: 0.00     Average packs/day: 2.0 packs/day for 48.0 years (96.0 ttl pk-yrs)     Types: Cigarettes     Start date: 11/06/1963     Quit date: 11/06/2011     Years since quitting: 11.6     Passive exposure: Past    Smokeless tobacco: Never   Vaping Use    Vaping status: Never Used   Substance and Sexual Activity    Alcohol use: No    Drug use: No    Sexual activity: Not Currently   Social History Narrative    She lives in [place]. 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 She denies alcohol and illicit drug use.       Social Determinants of Health     Financial Resource Strain: Low Risk  (01/28/2023)    Overall Financial Resource Strain (CARDIA)     Difficulty of Paying Living Expenses: Not hard at all   Food Insecurity: No Food Insecurity (01/28/2023)    Hunger Vital Sign     Worried About Running Out of Food in the Last Year: Never true     Ran Out of Food in the Last Year: Never true   Transportation Needs: No Transportation Needs (01/28/2023)    PRAPARE - Therapist, art (Medical): No     Lack of Transportation (Non-Medical): No   Physical Activity: Inactive (09/09/2017)    Received from [facility], [facility]    Exercise Vital Sign     Days of Exercise per Week: 0 days     Minutes of Exercise per Session: 0 min   Stress: No Stress Concern Present (09/09/2017)    Received from [facility], [facility]    [facility] - Occupational Stress Questionnaire     Feeling of Stress : Only a little   Social  Connections: Somewhat Isolated (09/09/2017)    Received from [facility], [facility]    Social Connection and Isolation Panel [NHANES]     Frequency of Communication with Friends and Family: More than three times a week     Frequency of Social Gatherings with Friends and Family: Once a week     Attends Religious Services: More than 4 times per year     Active Member of [employer] or Organizations: No     Attends Banker Meetings: Never     Marital Status: Divorced          MEDS (personally reviewed in EPIC, pertinent meds noted below)     PHYSICAL EXAM   BP 147/86 (BP Site: L Arm, BP Position: Sitting, BP Cuff Size: Small)  - Pulse 87  - Temp 36.3 ??C (97.3 ??F) (Temporal)  - Ht 164.6 cm (5' 4.8)  - Wt 74.8 kg (165 lb)  - SpO2 99%  - BMI 27.63 kg/m??       General Appearance:   Well-developed, well-nourished, comfortable, no distress, appears stated age.   Eyes:  PERRL, conjunctiva clear, EOM's intact. No drainage.   Ears:  Normal external ears   Nose: Nares normal, septum midline, mucosa normal, no drainage    or sinus tenderness, no polyps   Oropharynx: Moist mucus membranes without lesions or thrush.  Good dentition.  Posterior pharynx clear. Voice tremor.    Neck:  Supple. Trachea midline. No masses.   Respiratory:   CTAB.  No crackles, wheezes, or rhonchi.  No accessory muscle use. Symmetric chest expansion.    Cardiovascular:  Regular rate and rhythm. S1 and S2 normal. No murmur, rub  or gallop.  No JVD.  No peripheral edema   Gastrointestinal:   Abdomen soft, non-tender, and non-distended   Musculoskeletal: No tenderness or deformity of the chest wall.  Joints normal without swelling.   No clubbing or cyanosis. Normal tone   Skin:  No rashes, lesions, skin changes.  No bruising or petechiae.       Neurologic/Psychiatric:  Alert and oriented x3. Appropriate affect. No focal neurological deficits.  Normal gait and motor function.       RESULTS     Labs (reviewed in EPIC, pertinent values noted below)     Autoimmune serologies/markers:     ANA, RF, CCP, Anti-myositis panel, ENA, CK, Aldolase, CRP, sed rate, ANCA    The following were within normal limits:  5/23: CRP, aldolase, RF, ENA, CCP  7/23: igG, aldolase, CK, ANCA    The following were abnormal:    5/23: myositis panel + U1 RNP), ANA 80 homog    PFTs (personally reviewed and interpreted)     Date: FVC (% Pred) FEV1 (% Pred) ratio TLC DLCO low sat dist   01/26/22 2.57 (94) 1.75 (83) no bd resp 68%  12.33 (64) 98% 373 m   08/10/22 2.61 (94) 1.85 (87) 71%  12.42 (65) 98% 214 m   12/24/21 2.80 (101) 2.08 (98) 74%  13.13 (69)     06/29/23 2.67 (97) 2.00 (95) 75%  11.38 (60)                                         I personally reviewed the following studies:    LDCT report OSH 12/21/22: stable bibasilar reticular opacities, stable pulm nodules, largest RML 7.9 mm  Chest CT 07/28/22: LUNGS, AIRWAYS, AND PLEURA: Unchanged basilar predominant groundglass diffuse bilateral peripheral reticulation and mild basilar interlobular septal thickening. No honeycombing or traction bronchiectasis. Post radiation fibrosis in the subpleural left upper lobe. Unchanged subcentimeter benign-appearing pulmonary nodules. For reference a dominant 9 mm perifissural nodule along the right minor fissure (3:353) likely a intrafissural lymph node, a 4 mm nodule in the left upper lobe (3:277), a 5 mm nodule in the left lower lobe (3:513). No new or enlarging pulmonary nodule. Central airways are patent. No pleural effusion or pneumothorax.MEDIASTINUM AND LYMPH NODES: No enlarged intrathoracic lymph nodes. Small sliding type hiatal hernia.        Chest CT 01/13/22: c/w 06/14/17 9 x 9 x 5 mm right middle lobe perifissural nodule previously 7 x 6 x 3 mm. 7 mm nodule periphery left lower lobe image 204 series 3. Minimal groundglass opacities lung bases. Increased mild to moderate peripheral septal thickening and reticular opacities.        Assessment/Plan  Dyspnea  - likely multifactorial including anemia, cervical spine myoclonus, ILD, deconditioning  - PFTs have been stable- lung disease not likely major contributor to dyspnea      ILD (interstitial lung disease) (CMS-HCC)  The etiology of the patient's Interstitial Lung Disease is unknown.  Based upon the patient's history, exam, and diagnostic studies to date, I favor a diagnosis of Idiopathic interstitial pneumonia (IPF >NSIP> smoking related ILD- though slow progressing), an antisynthetase syndrome (+ SRM ab)      -prior lab tests -ck,aldolase, IgG, ANCA - wnl  - repeat PFTs show stability   - referred to pulm rehab previously  - could consider other treatment such as antifibrotic (OFEV, pirfenidone) if she demonstrates progressive disease     Lung nodules  -  follow up with LDCT lung screening per IP recs with PCP, reviewed report above      Iron deficiency anemia  - follow up with hematology    Weight loss  - etiology is unclear, stabilized  - does not appear related to lungs  - encouraged follow up with PCP and oncology    PCP Proph:  - not indicated    Bone Health  - Will defer to PCP  - Last Dexa:    Vaccines  Immunization History   Administered Date(s) Administered Comments    COVID-19 VACC,MRNA,(PFIZER)(PF) 10/20/2019 Historical - Not administered in Epic     11/10/2019 Historical - Not administered in Epic     05/31/2020 [facility]    INFLUENZA INJ MDCK PF, QUAD,(FLUCELVAX)(90MO AND UP EGG FREE) 05/15/2021     INFLUENZA QUAD HIGH DOSE 60YRS+(FLUZONE) 06/02/2016      05/24/2018      04/27/2019      05/29/2022 Adminis    INFLUENZA TIV (TRI) 90MO+ W/ PRESERV (IM) 05/26/2013      06/10/2014     INFLUENZA TIV (TRI) PF (IM)(HISTORICAL) 05/23/2011      05/30/2012     Influenza Virus Vaccine, unspecified formulation 06/18/2015      06/14/2017      05/17/2020      06/06/2020 Historical - Not administered in Epic    Influenza Whole 06/07/2012     PNEUMOCOCCAL POLYSACCHARIDE 23-VALENT 09/08/2007      02/21/2016      04/27/2019 Historical - Not administered in Epic    Pneumococcal Conjugate 13-Valent 09/10/2014      02/09/2018 Historical - Not administered in Epic    RSV VACCINE,ADJUVANTED(PF)(27YRS+)(AREXVY) 01/12/2023 Adminis    SHINGRIX-ZOSTER VACCINE (  HZV),RECOMBINANT,ADJUVANTED(IM) 05/29/2022 Adminis     01/12/2023 Adminis    TdaP 01/12/2023 Adminis    ZOSTAVAX - ZOSTER VACCINE, LIVE, SQ 04/27/2011         20 min was spent with the patient face to face and 15 min was spent reviewing chart/imaging.    Complex medical decision making was performed.

## 2023-06-29 ENCOUNTER — Ambulatory Visit: Admit: 2023-06-29 | Discharge: 2023-06-29

## 2023-06-29 DIAGNOSIS — R0602 Shortness of breath: Principal | ICD-10-CM

## 2023-06-29 DIAGNOSIS — R918 Other nonspecific abnormal finding of lung field: Principal | ICD-10-CM

## 2023-06-29 DIAGNOSIS — J849 Interstitial pulmonary disease, unspecified: Principal | ICD-10-CM

## 2023-06-29 NOTE — Unmapped (Signed)
I recommend receiving the flu vaccine in the fall.     Thank you for allowing me to be a part of your care. Please call the clinic with any questions.    Romanita Fager MD   Pulmonary and Critical Care Medicine  [place], [place]     Thank you for your visit to the [facility]. You may receive a survey from [facility] regarding your visit today, and we are eager to use this feedback to improve your experience and provide outstanding care. Thank you for taking the time to fill it out.    Between appointments, you can reach us at these numbers:    For clinical questions: ILD nurse coordinator Liesl Jeffreys [phone]  For appointments: [phone]  Fax: [phone]

## 2023-07-02 ENCOUNTER — Ambulatory Visit: Admit: 2023-07-02 | Discharge: 2023-07-03

## 2023-07-02 MED ADMIN — acetaminophen (TYLENOL) tablet 650 mg: 650 mg | ORAL | @ 16:00:00 | Stop: 2023-07-02

## 2023-07-02 MED ADMIN — cetirizine (ZYRTEC) tablet 10 mg: 10 mg | ORAL | @ 16:00:00 | Stop: 2023-07-02

## 2023-07-02 MED ADMIN — ferumoxytol (FERAHEME) 510 mg in sodium chloride (NS) 0.9 % 100 mL IVPB: 510 mg | INTRAVENOUS | @ 16:00:00 | Stop: 2023-07-02

## 2023-07-02 NOTE — Unmapped (Signed)
Pt presents for Feraheme infusion, VSS.  Pt given verbal and written instructions on medication and all questions answered. IV placed, premeds administered.  Pt aware of potential reaction/side effects, call bell within reach.    1202 Feraheme 510 mg started    1221 Infusion complete.      Pt tolerated without complication, VSS. IV flushed per policy. Pt remained at [facility] for 30 minutes post infusion for observation without any s/s of infusion reaction noted. IV d/c'd, gauze and coban applied.  Pt left clinic in no acute distress.

## 2023-07-09 ENCOUNTER — Ambulatory Visit: Admit: 2023-07-09 | Discharge: 2023-07-10 | Payer: PRIVATE HEALTH INSURANCE

## 2023-07-09 MED ADMIN — acetaminophen (TYLENOL) tablet 650 mg: 650 mg | ORAL | @ 18:00:00 | Stop: 2023-07-09

## 2023-07-09 MED ADMIN — ferumoxytol (FERAHEME) 510 mg in sodium chloride (NS) 0.9 % 100 mL IVPB: 510 mg | INTRAVENOUS | @ 18:00:00 | Stop: 2023-07-09

## 2023-07-09 MED ADMIN — cetirizine (ZYRTEC) tablet 10 mg: 10 mg | ORAL | @ 18:00:00 | Stop: 2023-07-09

## 2023-07-09 NOTE — Unmapped (Signed)
[facility] Refill Coordination Note    Specialty Lite Medication(s) to be Shipped:   Repatha    Other medication(s) to be shipped: No additional medications requested for fill at this time     Amber Duncan, DOB: 12/26/1950  Phone: [phone] (home) [phone] (work)      All above HIPAA information was verified with patient.     Was a Nurse, learning disability used for this call? No    Changes to medications: [name] reports no changes at this time.  Changes to insurance: No      REFERRAL TO PHARMACIST     Referral to the pharmacist: Not needed      [facility]     Shipping address confirmed in Epic.     Delivery Scheduled: Yes, Expected medication delivery date: 07/12/23.     Medication will be delivered via Same Day Courier to the prescription address in Epic WAM.    Moshe Salisbury   [facility] Specialty Technician

## 2023-07-09 NOTE — Unmapped (Signed)
Pt presents for Feraheme infusion, VSS.  IV placed, premeds administered.  Pt aware of potential reaction/side effects, call bell within reach.  1404 Feraheme 510mg  started  1419 Infusion complete.  Pt tolerated without complication, VSS. IV flushed per policy and d/c'd, gauze and coban applied.  Pt left clinic in no acute distress.

## 2023-07-12 NOTE — Unmapped (Signed)
 Abstraction Result Flowsheet Data    This patient's last AWV date: [facility] Last Medicare Wellness Visit Date: Not Found  This patients last WCC/CPE date: : Not Found      Reason for Encounter  Reason for Encounter: Outreach  Primary Reason for Outreach: AWV  Text Message: No  MyChart Message: No  Outreach Call Outcome: Alignment patient

## 2023-07-14 NOTE — Unmapped (Signed)
Assessment and Plan:       72 yo female presents today for follow up   -At today's visit she appeared quite depressed but continue to deny depression. She discussed a lot regarding her advance directive and verbalized multiple times that she is a DO NOT RESUSCITATE and that she does have the documents at home and that her family knows of her wishes.   -She verbalized multiple times that she does not want to extend her life more than 6 months. However she does not appear to meet Hospice criteria. I am somewhat confused as to why she continued to say this throughout the visit. I do believe there is an aspect of depression. However she continues to deny this. She does not appear to be a danger to herself or others however.     (1) Coronary artery disease involving native coronary artery of native heart without angina pectoris, Essential hypertension, HLD  -undergone 2xPCIs to RCA with most recently in setting of stable angina in 07/2019  -ACEi/ARB: Lisinopril 5mg  every day. Previously on Amlodipine but stopped at cardiology visit on 11/24/2022.  -Lipid Lowering agents: Repatha  140mg  every 14 days. Previously on Pravastatin but stopped  -Anti-platelet therapy: Aspirin 81mg  every day     (2)Diabetes mellitus without complication (CMS-HCC)  -Previous A1C was found to be 5.3% on 01/28/23. She reports signficant episodes of hypoglycemia (with her BG in the 40s). Given this, she will take her Metformin PRN.    -Advised to STOP METFORMIN. We will obtain Labs, Urine Microalbumin and refer to Ophthalmology for diabetic eye exam  -     Albumin/creatinine urine ratio  -     Ambulatory Referral to Ophthalmology; Future  -     Hemoglobin A1c  -     Lipid Panel  -     Comprehensive Metabolic Panel  -Discussed IN DEPTH regarding ER precautions for hypoglycemia. She verbalized understanding and agreement to ER precuations.     (3) Malignant neoplasm of lower-inner quadrant of left breast in female, estrogen receptor positive (CMS-HCC)  -History of infiltrating ductal carcinoma, estrogen receptor positive, progesterone receptor positive and HER2 negative s/p lumpectomy in 01/2020 and follow up radiation Rx completed in 02/2020   -Following closely with Oncology, last visit on 03/25/2023:  RECOMMENDATIONS:  FOLLOW-UP:  Surg onc in 6 months with bilateral mammo.  I can see back in 12 months  R breast pain:  Resolved, no concerning findings on recent mammo  Skin:  No significant- has a small mole/skin tag on the L breast- she'll discuss further with her PCP and discussed prn steroid for itching for now  Lung nodule and possible ILD- following with pulm- undergoing LD-chest CT for screening  Iron deficiency:  Following with hematology  Weight loss:  Unclear etiology:  following with several specialists and awaiting GI evaluation.  Endocrine therapy:  Decline  -Next Surg/Onc appointment on 10/07/2022    (4) Chronic/Recurrent UTI  -She was previously following with urology. However as noted by previous PCP, she declined referral/follow up with Urology at visit in May 2024  -Currently taking: Keflex 250mg  every day and Estrace Vaginal Cream twice a week    (5) Angiodysplasia of intestine, Constipation, unspecified constipation type, Abdominal pain   -First noted in 2018.   -Evaluated by GI with [facility] on 06/10/2023 with plan to obtain XR Barium Swallow as well as to take Omeprazole. It was noted that her esophageal dysphagia could be: Secondary possibly to untreated GERD, esophageal dysmotility,  esophageal stricture. Patient has hx of non-occlusive B ring of the esophagus   -We discussed her Barium Swallow results in depth which noted Moderate esophageal dysmotility, small type 1 hiatal hernia as well as GERD.   -However she is still concerned for possible issue with her pancreas. Given her concern, we will order CT Abdomen/Pelvis  -The last CT scan she had obtained on 02/03/23 did reveal: GI TRACT: Small sliding hiatal hernia. No findings of bowel obstruction or acute inflammation.  Normal appendix (4:41-43). Large volume colonic solid stool burden. Nonobstructive herniation of a segment of the rectum into the left ischioanal fossa (2:130, 4:76).   -     CT Abdomen Pelvis W Contrast; Future    (6) Hypothyroidism, unspecified type  -Currently taking Synthroid . Rechecking Thyroid function testing  -     T4, Free  -     T3, Free  -   TSH    (7) Iron deficiency Anemia  -Chronic in nature.  - She is following with Hematology with last visit on 05/17/2023 with plan of Plan:  - Labs collected today: CBC, Iron, Iron Sat, TIBC, Ferritin, Sed Rate, CRP, and B12, BMP, Magnesium  - Plan for IV Feraheme 510 mg x 2 (1020 mg total dose)  - Decrease OTC PO iron supplements to QMWF  - Continue PO B-12 supplements   - Return in 6 months for repeat labs and assessment.    (8)Occipital Neuralgia, Cervical level spinal myoclonus, Dysphonia spastica and migraine headache history  -Currently taking: Levetiracetam 250mg  BID and Clonazepam 0.5mg  TID PRN  -Last Neurology visit on 05/25/23: Assessment:   Cervical level Spinal myoclonus: She feels no change though I feel her symptoms were improved today as she was able to speak through most of the visit. I am concerned that the medications are causing her to be more drowsy and fatigued, but she is hesitant to reduce the doses in case spasms worsen again. We will try rearranging doses to have larger Clonazepam dose in the evening and lower dose in the morning. We discussed voice amplification to help with embarrassement when others cannot hear her in public.  Imbalance: no clear cerebellar ataxia but she appears unsteady on her feet with turns, and has had a few falls. She ascribes this at least partially to her fatigue. I offered PT for balance, she is concerned about cost and driving distance, I provided her with a physical referral to take to a local PT she likes.  PLAN  Let's try rearranging your medication doses to see if this lessens the sleepiness. Please take 1 tab of Clonazepam in the morning, and 2 tabs at night  Consider owning a microphone device or voice amplifier that you can use in loud restaurants or other situations where others may have trouble hearing you. You can find these at [employer], [employer], or [employer]  I will refer you for physical therapy for balance and walking. I will print a prescription to give you to take where you like to go  Follow up in 6 months  -She is currently taking: Klonopin 0.75mg  BID as well as Levetriacetam 1200mg  BID.     (9) Depression, insomnia  -Currently taking Prozac 40mg  every day and trazodone at night.     (10) Weight loss  Wt Readings from Last 6 Encounters:   06/29/23 74.8 kg (165 lb)   05/25/23 75.3 kg (166 lb)   05/17/23 73.9 kg (163 lb)   04/12/23 74.2 kg (163 lb 9.6 oz)  03/25/23 74.6 kg (164 lb 8 oz)   01/28/23 76.6 kg (168 lb 14.4 oz)   -her weight has been relatively stable since July      (11)COPD/ILD, Pulmonary Nodules  -She is part of a lung cancer screening study and is following with Pulmonology (last appointment on 12/25/2022)  -CT chest obtained on 12/31/2022. Pulmonology visit on 06/29/2023:   Dyspnea - likely multifactorial including anemia, cervical spine myoclonus, ILD, deconditioning - PFTs have been stable- lung disease not likely major contributor to dyspnea  ILD (interstitial lung disease) (CMS-HCC)  The etiology of the patient's Interstitial Lung Disease is unknown.  Based upon the patient's history, exam, and diagnostic studies to date, I favor a diagnosis of Idiopathic interstitial pneumonia (IPF >NSIP> smoking related ILD- though slow progressing), an antisynthetase syndrome (+ SRM ab)   -prior lab tests -ck,aldolase, IgG, ANCA - wnl  - repeat PFTs show stability   - referred to pulm rehab previously  - could consider other treatment such as antifibrotic (OFEV, pirfenidone) if she demonstrates progressive disease  Lung nodules-  follow up with LDCT lung screening per IP recs with PCP, reviewed report above      (12) Dizziness, Balance disorder, weakness  -She continues to have dizziness and issues with her balance. We discussed Neurology's recommendations for Physical Therapy referral. I verbalized in depth that I do agree with these recommendations. However she verbalized multiple times she does not want to undergo this  -She reports that her dizziness has gotten worse the last month. When asked why she didn't go to the ER, she verbalized that they wouldn't do anything anyway. We discussed IN DEPTH Regarding concern for falls and head injury which she verbalized understanding too. However she has declined going to the ER at this time. We discussed that there is a risk of falls, head injury which could ultimately lead to death. She verbalized understanding but continues to decline going to the ER.  -We discussed possible Polypharmacy as well as she does take a Benzo in the AM for her Dysphonia. However this could still be effecting her balance/causing dizziness  -We will obtain MRI Brain w/wo contrast (she does have cardiac stents placed though). Will also obtain Labs listed below to look for additional causes  -Re-enforced importance of following up with Physical Therapy and referral replaced.  -     MRI Brain W Wo Contrast; Future  -     Ambulatory Referral to Physical Therapy; Future  -     CK  -     Urinalysis with Microscopy  -     TSH  -     Testosterone, free, total  -     Follicle Stimulating Hormone Level [employer])  -     Luteinizing hormone  -     Cortisol; Future  -     ACTH; Future  -     Insulin-Like Growth Factor (IGF-1); Future      HEALTH MAINTENANCE  -Discussed due for mammogram, she reports she does have one scheduled in December.   -Error was made last visit when reviewing the chart. Her last DEXA appears to have been 11/18/2020, not in 2024. Given this, will order DEXA.  -     Dexa Bone Density Skeletal; Future  -Advised repeat imaging in 2025 for adrenal nodules that have been present since 2007        Advised chronic care follow up in 3 months    I personally spent >40 minutes face-to-face  and non-face-to-face in the care of this patient, which includes all pre, intra, and post visit time on the date of service.  All documented time was specific to the E/M visit and does not include any procedures that may have been performed.          Subjective:     Amber Duncan 72 y.o.female   is being seen in follow up     She is very dizziness   She notes it has been going on for >1 year but her dizziness has been getting worse the last month   Her legs are very weak     She also knows that her her BG has been low     She does not want Physical therapy because she feels like   She denies being depressed but is living with what I'm living with and when its over its over  She is not trying to do anything to extend it  She does not want to go to the doctor/ER  However she notes that she feels so bad she can't not stand her ownself     She did have Barium Swallow study   -She thinks there is something off with her pancreas because her sugars are low       She spent a lot of today talking about her code status and that she does not want to be resuscitated   And that she would rather die early than linger to 95    She notes that the dystonia could be a precursor to parkinson's and her cousin just died from parkinson's     She does take Klonopin 1.5 tabelt in the AM and 1.5 tablet in the morning   Keppra 1000mg  and 25mg  --for dysdonia   Trazodone at night     She will only take Metformin when her BG is high. She takes this PRN and not daily   She notes that her BG was 45 last week multiple days    No LMP recorded. Patient has had a hysterectomy.        ROS:     ROS negative unless otherwise noted in HPI    Vital Signs:     Wt Readings from Last 3 Encounters:   06/29/23 74.8 kg (165 lb)   05/25/23 75.3 kg (166 lb)   05/17/23 73.9 kg (163 lb)     Temp Readings from Last 3 Encounters:   07/15/23 36 ??C (96.8 ??F)   07/09/23 36.4 ??C (97.5 ??F) (Temporal)   07/02/23 36.3 ??C (97.4 ??F) (Temporal)     BP Readings from Last 3 Encounters:   07/15/23 136/66   07/09/23 123/70   07/02/23 148/79     Pulse Readings from Last 3 Encounters:   07/15/23 81   07/09/23 64   07/02/23 75       Objective:     General Appearance: Alert, cooperative, not toxic or ill appearing   Head and Neck: Normocephalic, atraumatic.   EENT: EOMI, wearing facemask. No nasal congestion present. Voice is hoarse however  Cardiovascular:  Regular rate and rhythm. No murmurs  Respiratory: Lungs clear to auscultation bilaterally,  Respiratory effort unremarkable. No wheezes or rhonchi.   Abdomen: Abdomen is soft and not distended.  Extremities :No edema.    Integumentary: Warm and dry. Ecchymosis on upper extremities  Neurologic: Alert and oriented x3. CN II-XII grossly intact. Her gait is quite unstable. She reported leaving her cane in the car  Psychiatric:  She is very frustrated and irritated today

## 2023-07-15 ENCOUNTER — Ambulatory Visit
Admit: 2023-07-15 | Discharge: 2023-07-15 | Payer: PRIVATE HEALTH INSURANCE | Attending: Student in an Organized Health Care Education/Training Program | Primary: Student in an Organized Health Care Education/Training Program

## 2023-07-15 ENCOUNTER — Ambulatory Visit: Admit: 2023-07-15 | Discharge: 2023-07-15 | Payer: PRIVATE HEALTH INSURANCE

## 2023-07-15 DIAGNOSIS — K5520 Angiodysplasia of colon without hemorrhage: Principal | ICD-10-CM

## 2023-07-15 DIAGNOSIS — E119 Type 2 diabetes mellitus without complications: Principal | ICD-10-CM

## 2023-07-15 DIAGNOSIS — Z09 Encounter for follow-up examination after completed treatment for conditions other than malignant neoplasm: Principal | ICD-10-CM

## 2023-07-15 DIAGNOSIS — R109 Unspecified abdominal pain: Principal | ICD-10-CM

## 2023-07-15 DIAGNOSIS — R634 Abnormal weight loss: Principal | ICD-10-CM

## 2023-07-15 DIAGNOSIS — R2689 Other abnormalities of gait and mobility: Principal | ICD-10-CM

## 2023-07-15 DIAGNOSIS — R531 Weakness: Principal | ICD-10-CM

## 2023-07-15 DIAGNOSIS — C50919 Malignant neoplasm of unspecified site of unspecified female breast: Principal | ICD-10-CM

## 2023-07-15 DIAGNOSIS — R42 Dizziness and giddiness: Principal | ICD-10-CM

## 2023-07-15 DIAGNOSIS — R5382 Chronic fatigue, unspecified: Principal | ICD-10-CM

## 2023-07-15 DIAGNOSIS — N951 Menopausal and female climacteric states: Principal | ICD-10-CM

## 2023-07-15 DIAGNOSIS — E039 Hypothyroidism, unspecified: Principal | ICD-10-CM

## 2023-07-15 LAB — LIPID PANEL
CHOLESTEROL/HDL RATIO SCREEN: 3.9 (ref 1.0–4.5)
CHOLESTEROL: 225 mg/dL — ABNORMAL HIGH (ref <=200)
HDL CHOLESTEROL: 58 mg/dL (ref 40–60)
LDL CHOLESTEROL CALCULATED: 139 mg/dL — ABNORMAL HIGH (ref 40–99)
NON-HDL CHOLESTEROL: 167 mg/dL — ABNORMAL HIGH (ref 70–130)
TRIGLYCERIDES: 142 mg/dL (ref 0–150)
VLDL CHOLESTEROL CAL: 28.4 mg/dL (ref 11–41)

## 2023-07-15 LAB — URINALYSIS WITH MICROSCOPY
BACTERIA: NONE SEEN /HPF
BILIRUBIN UA: NEGATIVE
BLOOD UA: NEGATIVE
GLUCOSE UA: NEGATIVE
KETONES UA: NEGATIVE
NITRITE UA: NEGATIVE
PH UA: 6.5 (ref 5.0–9.0)
PROTEIN UA: NEGATIVE
RBC UA: 1 /HPF (ref <=4)
SPECIFIC GRAVITY UA: 1.014 (ref 1.003–1.030)
SQUAMOUS EPITHELIAL: 2 /HPF (ref 0–5)
UROBILINOGEN UA: <2
WBC UA: 1 /HPF (ref 0–5)

## 2023-07-15 LAB — COMPREHENSIVE METABOLIC PANEL
ALBUMIN: 4.4 g/dL (ref 3.4–5.0)
ALKALINE PHOSPHATASE: 101 U/L (ref 46–116)
ALT (SGPT): 45 U/L (ref 10–49)
ANION GAP: 5 mmol/L (ref 5–14)
AST (SGOT): 42 U/L — ABNORMAL HIGH (ref <=34)
BILIRUBIN TOTAL: 0.3 mg/dL (ref 0.3–1.2)
BLOOD UREA NITROGEN: 32 mg/dL — ABNORMAL HIGH (ref 9–23)
BUN / CREAT RATIO: 30
CALCIUM: 10.5 mg/dL — ABNORMAL HIGH (ref 8.7–10.4)
CHLORIDE: 105 mmol/L (ref 98–107)
CO2: 29 mmol/L (ref 20.0–31.0)
CREATININE: 1.06 mg/dL — ABNORMAL HIGH
EGFR CKD-EPI (2021) FEMALE: 55 mL/min/{1.73_m2} — ABNORMAL LOW (ref >=60)
GLUCOSE RANDOM: 119 mg/dL (ref 70–179)
POTASSIUM: 4.5 mmol/L (ref 3.4–4.8)
PROTEIN TOTAL: 8 g/dL (ref 5.7–8.2)
SODIUM: 139 mmol/L (ref 135–145)

## 2023-07-15 LAB — LUTEINIZING HORMONE: LUTEINIZING HORMONE: 20.8 m[IU]/mL

## 2023-07-15 LAB — T3, FREE: T3 FREE: 2.17 pg/mL — ABNORMAL LOW (ref 2.30–4.20)

## 2023-07-15 LAB — CK: CREATINE KINASE TOTAL: 33 U/L — ABNORMAL LOW

## 2023-07-15 LAB — HEMOGLOBIN A1C
ESTIMATED AVERAGE GLUCOSE: 108 mg/dL
HEMOGLOBIN A1C: 5.4 % (ref 4.8–5.6)

## 2023-07-15 LAB — T4, FREE: FREE T4: 1.11 ng/dL (ref 0.89–1.76)

## 2023-07-15 LAB — FOLLICLE STIMULATING HORMONE: FOLLICLE STIMULATING HORMONE: 56.6 m[IU]/mL

## 2023-07-15 LAB — TSH: THYROID STIMULATING HORMONE: 0.683 u[IU]/mL (ref 0.550–4.780)

## 2023-07-15 LAB — CORTISOL: CORTISOL TOTAL: 8.1 ug/dL

## 2023-07-16 ENCOUNTER — Ambulatory Visit: Admit: 2023-07-16 | Discharge: 2023-07-17 | Payer: PRIVATE HEALTH INSURANCE

## 2023-07-18 NOTE — Unmapped (Signed)
Please contact this patient with the following  -Your cortisol level was in normal range  -Your hormone labs were in normral range  -Your A1C was 5.4% which is in normal range. Please STOP taking metformin   -Your kidney function was a little down from before. Please make sure you are staying hydrated. I want to repeat this lab in 2-4 weeks. Please go to the [facility] to have this obtained as they have a walk in lab  -Your cholesterol panel did get a little worse from before.   -At this time, I would NOT recommend making changes to your medications except to STOP metformin.     Please let me know if she has any questions.   Thank you

## 2023-07-20 LAB — ACTH: ADRENOCORTICOTROPIC HORMONE: 11 pg/mL

## 2023-07-21 DIAGNOSIS — E039 Hypothyroidism, unspecified: Principal | ICD-10-CM

## 2023-07-21 LAB — INSULIN-LIKE GROWTH FACTOR 1 (IGF-1)
IGF-1: 90 ng/mL
Z-SCORE: 0 {STDV}

## 2023-07-21 MED ORDER — SYNTHROID 100 MCG TABLET
ORAL_TABLET | Freq: Every day | ORAL | 1 refills | 90 days | Status: CP
Start: 2023-07-21 — End: 2024-01-17

## 2023-07-21 MED ORDER — CLONAZEPAM 0.5 MG TABLET
ORAL_TABLET | 0 refills | 0 days | Status: CP
Start: 2023-07-21 — End: ?

## 2023-07-21 NOTE — Unmapped (Signed)
Patient is requesting the following refill  Requested Prescriptions     Pending Prescriptions Disp Refills    SYNTHROID 100 mcg tablet [Pharmacy Med Name: SYNTHROID 100 MCG TAB] 90 tablet 1     Sig: Take 1 tablet (100 mcg total) by mouth daily.       Recent Visits  Date Type Provider Dept   07/15/23 Office Visit Mangel, Benison Pap, DO [place] [facility] [address] [facility]   04/12/23 Office Visit Mangel, Benison Pap, DO [place] [facility] [address] At [facility]   01/28/23 Office Visit [name], MD [place] [facility] [address] At [facility]   09/10/22 Office Visit [name], MD [place] [facility] [address] At [facility]   Showing recent visits within past 365 days and meeting all other requirements  Future Appointments  Date Type Provider Dept   10/15/23 Appointment Mangel, Benison Pap, DO [place] [facility] [address] At [facility]   Showing future appointments within next 365 days and meeting all other requirements       Labs: Free T3 and Free T4:   T3, Free (pg/mL)   Date Value   07/15/2023 2.17 (L)     Free T4 (ng/dL)   Date Value   16/06/9603 1.11    TSH:   TSH (uIU/mL)   Date Value   07/15/2023 0.683

## 2023-07-22 ENCOUNTER — Ambulatory Visit: Admit: 2023-07-22 | Discharge: 2023-07-23 | Payer: PRIVATE HEALTH INSURANCE

## 2023-07-24 ENCOUNTER — Ambulatory Visit: Admit: 2023-07-24 | Discharge: 2023-07-25 | Payer: PRIVATE HEALTH INSURANCE

## 2023-07-24 MED ADMIN — iohexol (OMNIPAQUE) 350 mg iodine/mL solution 100 mL: 100 mL | INTRAVENOUS | @ 19:00:00 | Stop: 2023-07-24

## 2023-07-30 LAB — TESTOSTERONE, FREE, TOTAL
TESTOSTERONE (MAYO): <7 ng/dL — ABNORMAL LOW
TESTOSTERONE FREE: 0.17 ng/dL

## 2023-08-08 ENCOUNTER — Ambulatory Visit: Admit: 2023-08-08 | Discharge: 2023-08-09 | Payer: PRIVATE HEALTH INSURANCE

## 2023-08-09 ENCOUNTER — Ambulatory Visit: Admit: 2023-08-09 | Discharge: 2023-08-10 | Payer: PRIVATE HEALTH INSURANCE

## 2023-08-09 LAB — COMPREHENSIVE METABOLIC PANEL
ALBUMIN: 3.9 g/dL (ref 3.4–5.0)
ALKALINE PHOSPHATASE: 92 U/L (ref 46–116)
ALT (SGPT): 13 U/L (ref 10–49)
ANION GAP: 12 mmol/L (ref 5–14)
AST (SGOT): 19 U/L (ref <=34)
BILIRUBIN TOTAL: 0.3 mg/dL (ref 0.3–1.2)
BLOOD UREA NITROGEN: 19 mg/dL (ref 9–23)
BUN / CREAT RATIO: 20
CALCIUM: 10.1 mg/dL (ref 8.7–10.4)
CHLORIDE: 100 mmol/L (ref 98–107)
CO2: 30.2 mmol/L (ref 20.0–31.0)
CREATININE: 0.93 mg/dL (ref 0.55–1.02)
EGFR CKD-EPI (2021) FEMALE: 65 mL/min/{1.73_m2} (ref >=60)
GLUCOSE RANDOM: 105 mg/dL (ref 70–179)
POTASSIUM: 4.3 mmol/L (ref 3.4–4.8)
PROTEIN TOTAL: 7.8 g/dL (ref 5.7–8.2)
SODIUM: 142 mmol/L (ref 135–145)

## 2023-08-09 LAB — PARATHYROID HORMONE (PTH): PARATHYROID HORMONE INTACT: 117.8 pg/mL — ABNORMAL HIGH (ref 18.4–80.1)

## 2023-08-11 LAB — VITAMIN D 25 HYDROXY: VITAMIN D, TOTAL (25OH): 31.6 ng/mL (ref 20.0–80.0)

## 2023-08-13 DIAGNOSIS — G4700 Insomnia, unspecified: Principal | ICD-10-CM

## 2023-08-13 MED ORDER — TRAZODONE 50 MG TABLET
ORAL_TABLET | 11 refills | 0 days
Start: 2023-08-13 — End: ?

## 2023-08-13 NOTE — Unmapped (Signed)
Patient is requesting the following refill  Requested Prescriptions     Pending Prescriptions Disp Refills    traZODone (DESYREL) 50 MG tablet [Pharmacy Med Name: TRAZODONE HCL 50 MG TAB] 30 tablet 11     Sig: TAKE 1/2 TO 1 TABLET BY MOUTH AT BEDTIMEAS DIRECTED.       Recent Visits  Date Type Provider Dept   07/15/23 Office Visit Mangel, Benison Pap, DO [place] [facility] [address] At [facility]   04/12/23 Office Visit Mangel, Benison Pap, DO [place] [facility] [address] [facility]   01/28/23 Office Visit [name], MD [place] [facility] [address] At [facility]   09/10/22 Office Visit [name], MD [place] [facility] [address] At [facility]   Showing recent visits within past 365 days and meeting all other requirements  Future Appointments  Date Type Provider Dept   10/15/23 Appointment Mangel, Benison Pap, DO  [facility] [address] At [facility]   Showing future appointments within next 365 days and meeting all other requirements       Labs: GAD7:   GAD7 Total Score GAD-7 Total Score   04/12/2023   3:00 PM 2      PHQ9:   PHQ-9 PHQ-9 TOTAL SCORE   04/28/2022   8:00 AM 4

## 2023-08-14 MED ORDER — TRAZODONE 50 MG TABLET
ORAL_TABLET | 11 refills | 0 days | Status: CP
Start: 2023-08-14 — End: ?

## 2023-08-20 MED ORDER — CLONAZEPAM 0.5 MG TABLET
ORAL_TABLET | 0 refills | 0.00 days
Start: 2023-08-20 — End: ?

## 2023-08-25 NOTE — Unmapped (Signed)
Called patient to get rescheduled from Polly[facility] Samples 1/31 bump. Would like to see if pt could come in on 1/30 instead. Left info and callback # to get rescheduled

## 2023-08-27 MED ORDER — CLONAZEPAM 0.5 MG TABLET
ORAL_TABLET | 0 refills | 0 days | Status: CP
Start: 2023-08-27 — End: ?

## 2023-09-15 DIAGNOSIS — I2510 Atherosclerotic heart disease of native coronary artery without angina pectoris: Principal | ICD-10-CM

## 2023-09-15 NOTE — Unmapped (Signed)
[facility] Delivery Pharm[facility] Refill Coordination Note    Specialty Lite Medication(s) to be Shipped:   Repatha    Other medication(s) to be shipped: No additional medications requested for fill at this time     Amber Duncan, DOB: Jan 09, 1951  Phone: [phone] (home) [phone] (work)      All above HIPAA information was verified with patient.     Was a Nurse, learning disability used for this call? No    Changes to medications: Amber Duncan reports no changes at this time.  Changes to insurance: No      REFERRAL TO PHARMACIST     Referral to the pharmacist: Not needed      [facility]     Shipping address confirmed in Epic.     Delivery Scheduled: Yes, Expected medication delivery date: 1/15.     Medication will be delivered via Same Day Courier to the prescription address in Epic WAM.    Amber Duncan   [facility] Delivery Pharmacy Specialty Technician

## 2023-09-22 NOTE — Unmapped (Signed)
Copied from CRM #[id number]. Topic: Access To Clinicians - Req Clinic Call Back  >> Sep 21, 2023  4:20 PM April T wrote:  The patient called requesting: A call back in regards to having a UTI again. She is not sure if it is a uti or yeast infection since she is currently on antibiotics for a UTI. She is stating that on the outside of her vagina its very raw, itching and blistered . She wanted to know if something could be called in for this or what she needs to do.This has been going on for a week now         Preferred Contact: Cell Phone Urgent callback turnaround time: within 24 business hours. Programmer, systems Notified)

## 2023-09-22 NOTE — Unmapped (Signed)
Amber Duncan called with complaints of burning and itching around her vaginal area, burning when urinating, also states that the area has been become raw. Requested antibiotics, advised to make an appointment to bee seen so she can be evaluated.If she can not get an appointment she should go to Urgent care.    Nigel Berthold, CMA

## 2023-09-23 NOTE — Unmapped (Signed)
Amber Duncan 's Repatha shipment will be canceled as a result of PA denied.     I have spoken with the patient  at [phone]  and communicated the delay. We will not reschedule the medication and have removed this/these medication(s) from the work request.  We have canceled this work request.

## 2023-09-28 MED ORDER — CLONAZEPAM 0.5 MG TABLET
ORAL_TABLET | 0 refills | 0 days | Status: CP
Start: 2023-09-28 — End: ?

## 2023-10-04 ENCOUNTER — Inpatient Hospital Stay: Admit: 2023-10-04 | Discharge: 2023-10-05 | Payer: PRIVATE HEALTH INSURANCE

## 2023-10-07 ENCOUNTER — Ambulatory Visit
Admit: 2023-10-07 | Discharge: 2023-10-08 | Payer: PRIVATE HEALTH INSURANCE | Attending: Adult Health | Primary: Adult Health

## 2023-10-07 DIAGNOSIS — Z170 Estrogen receptor positive status [ER+]: Principal | ICD-10-CM

## 2023-10-07 DIAGNOSIS — Z1231 Encounter for screening mammogram for malignant neoplasm of breast: Principal | ICD-10-CM

## 2023-10-07 DIAGNOSIS — C50812 Malignant neoplasm of overlapping sites of left female breast: Principal | ICD-10-CM

## 2023-10-07 NOTE — Unmapped (Signed)
It was a pleasure to see you today in the [facility] Surgical Oncology Clinic. Please call my nurse navigator, Alinda Dooms or Willia Craze, if you have any interval questions or concerns.     Follow up in 1 year with imaging.     Surgical oncology contact information:  For appointments, please call [phone].     Nurse Navigator available Monday through Friday 8:30 am to 4:30 pm:   Alinda Dooms, RN or Willia Craze, RN:    Phone: [phone]   Fax: [phone] attention Alinda Dooms, RN or Willia Craze, RN    For emergencies, evenings or weekends, please call [phone] and ask for the surgical oncology resident on call. Please be aware that this person is also responding to in-hospital emergencies and patient issues and may not answer your phone call immediately, but will return your call as soon as possible.

## 2023-10-08 NOTE — Unmapped (Signed)
Specialty Medication(s): Repatha    Amber Duncan has been dis-enrolled from the [employer] and [facility] specialty pharmacy services due to  patient requested to be off our call list.  She has not used Repatha since November and states the insurance denied PA .    Additional information provided to the patient: n/a    [name], PharmD  [facility] Special[facility] Home Delivery Pharmacy Specialty Pharmacist

## 2023-10-08 NOTE — Unmapped (Signed)
Patient Name: Amber Duncan  Patient Age: 73 y.o.  Encounter Date: 10/07/2023    Referring Physician:   Rudie Meyer, ANP  [address]  [address] [place], [id number]  [place],  [place] [address]    Primary Care Provider:  Rosine Beat, Benison Pap, DO    Breast Cancer Treatment Team:  Surgical Oncology: Sherilyn Cooter, DO  Surgical Oncology NP: Pollyann Samples, NP  Radiation Oncology: Rayetta Humphrey, MD  Medical Oncology: Royal Hawthorn, MD (has also been followed by Rickard Patience, MD at [facility])    Reason for Visit: Breast cancer surveillance visit.     HPI:  Amber Duncan is a 73 y.o. female who presents for followup after breast cancer surgery. Below is a synopsis of past and present health information pertinent to her breast cancer diagnosis, treatment and current health status. She is unaccompanied today. Amber Duncan was last seen in clinic on 10/01/2022. he has been having difficulty with dystonia that she feels is getting worse. She is not due to follow-up with neurology until March. She is scheduled for brain MRI on Monday for workup of her balance issues. She reports that she did notice a few scales on her right nipple recently. She was able to wash these off with a loofah and they have not recurred. She has not noticed any nipple discharge. She feels that her right breast has gotten bigger. She denies any recent weight gain. .She otherwise denies associated symptoms including redness, fever, chills, breast retraction, breast pain, or nipple inversion. No lymphedema or difficulty with ROM.  No other specific complaints today.   No other changes to her medical, surgical, family or social history.     Historically, Amber Duncan presented with an abnormal screening mammogram    Diagnosis:    Left breast infiltrating ductal carcinoma   Cancer Staging   Malignant neoplasm of female breast (CMS-HCC)  Staging form: Breast, AJCC 8th Edition  - Clinical stage from 12/27/2019: Stage IA (cT1b, cN0, cM0, G1, ER+, PR+, HER2-) - Signed by Talbert Cage, DO on 12/27/2019  - Pathologic stage from 01/29/2020: Stage IA (pT1b, pN0, cM0, G1, ER+, PR+, HER2-) - Signed by Talbert Cage, DO on 02/06/2020      Procedure:   Left US guided partial mastectomy, performed on 01/19/2020.  SLNBx omitted per [facility] criteria.    Clinical Trial Participant:   No clinical trials at this time.    Medical Oncology: declined by patient with Royal Hawthorn, MD    Radiation Oncology: whole breast irradiation with  Rayetta Humphrey, MD    Review of Systems:  10 organ systems reviewed and pertinent as noted in HPI.       Medical history reviewed as below. I have also reviewed additional records as provided.  Hematology/Oncology History Overview Note   2021: Left breast pT1b N0 IDC, G1, +/+/-     Malignant neoplasm of female breast (CMS-HCC)   10/24/2019 -  Presenting Symptoms    Abnormal screening MMG: asymmetry in the left breast     11/07/2019 Interval Scan(s)    MMG with tomo: Left breast LOQ small mass with associated distortion.  [place]: Left breast 7 x 4 x 6 mm irregular hypoechoic mass at the 5 o???clock 4 CFN position.     11/15/2019 Biopsy    Left breast stereotactic biopsy 5:00, 4CFN: IDC, G1, ER 95% positive, PR 80% positive. HER2- (1+) and associated DCIS.     12/26/2019 Initial Diagnosis  Malignant neoplasm of lower-inner quadrant of left breast in female, estrogen receptor positive (CMS-HCC)     12/27/2019 Tumor Board    MDC recs: Left breast cT1b N0 IDC, G1, +/+/-. 7 mm on imaging review, excellent candidate for BCT. Candidate to omit axillary staging. Possible candidate to omit radiation. Will see med/onc post op.      12/27/2019 -  Cancer Staged    Staging form: Breast, AJCC 8th Edition  - Clinical stage from 12/27/2019: Stage IA (cT1b, cN0, cM0, G1, ER+, PR+, HER2-) - Signed by Talbert Cage, DO on 12/27/2019       01/19/2020 Surgery    Left US-guided partial mastectomy performed by Dr. Dellis Anes. Final pathology showed IDC 7mm in greatest dimension with DCIS grade 2, all margins clear     01/29/2020 -  Cancer Staged    Staging form: Breast, AJCC 8th Edition  - Pathologic stage from 01/29/2020: Stage IA (pT1b, pN0, cM0, G1, ER+, PR+, HER2-) - Signed by Talbert Cage, DO on 02/06/2020         Past Medical History:   Diagnosis Date    Acute upper GI bleed 11/03/2019    Breast cancer (CMS-HCC)     Chronic UTI     COPD (chronic obstructive pulmonary disease) (CMS-HCC)     Coronary artery disease     Diabetes (CMS-HCC)     Disease of thyroid gland     Eye trauma 06/17/2022    injury due to a fall on right eye    Fatigue     Hepatitis     Hepatitis     Hyperlipidemia     Hypertension     Migraine     Sleep apnea      Past Surgical History:   Procedure Laterality Date    BACK SURGERY      BREAST BIOPSY Left 11/2019    positive    BREAST LUMPECTOMY Left 01/2020    CATARACT EXTRACTION      CHOLECYSTECTOMY      CORONARY STENT PLACEMENT      HYSTERECTOMY      KNEE SURGERY      PR COLONOSCOPY,ABLATE LESION N/A 01/21/2017    Procedure: COLONOSCOPY FLEX; W/ABLAT LES NOT AMENABLE-SNARE;  Surgeon: Luanne Bras, MD;  [facility];  Service: Gastroenterology    PR COLSC FLX W/RMVL OF TUMOR POLYP LESION SNARE TQ N/A 01/21/2017    Procedure: COLONOSCOPY FLEX; W/REMOV TUMOR/LES BY SNARE;  Surgeon: Luanne Bras, MD;  Location: [facility];  Service: Gastroenterology    PR MASTECTOMY, PARTIAL Left 01/19/2020    Procedure: MASTECTOMY, PARTIAL (EG, LUMPECTOMY, TYLECTOMY, QUADRANTECTOMY, SEGMENTECTOMY);  Surgeon: Talbert Cage, DO;  Location: MAIN OR [facility];  Service: Surgical Oncology Breast    PR UPPER GI ENDOSCOPY,BIOPSY N/A 04/29/2018    Procedure: UGI ENDOSCOPY; WITH BIOPSY, SINGLE OR MULTIPLE;  Surgeon: Rona Ravens, MD;  [facility];  Service: Gastroenterology    RADIATION Left     REDUCTION MAMMAPLASTY Bilateral     just a lift    REPLACEMENT TOTAL KNEE Right      Family History   Problem Relation Age of Onset    Other Mother         MVA    Other Father         MVA    No Known Problems Sister     Other Sister         MVA  Other Sister         MVA drunk driver    No Known Problems Daughter     No Known Problems Maternal Grandmother     No Known Problems Maternal Grandfather     No Known Problems Paternal Grandmother     No Known Problems Paternal Grandfather     Cancer Brother         liver    Cancer Brother         Lung    No Known Problems Brother     No Known Problems Son     Breast cancer Maternal Aunt     Cancer Maternal Aunt     Cancer Maternal Uncle     Diabetes Paternal Uncle     No Known Problems Other     Heart failure Neg Hx     BRCA 1/2 Neg Hx     Colon cancer Neg Hx     Endometrial cancer Neg Hx     Ovarian cancer Neg Hx     Glaucoma Neg Hx     Macular degeneration Neg Hx      Social History     Socioeconomic History    Marital status: Divorced   Tobacco Use    Smoking status: Former     Current packs/day: 0.00     Average packs/day: 2.0 packs/day for 48.0 years (96.0 ttl pk-yrs)     Types: Cigarettes     Start date: 11/06/1963     Quit date: 11/06/2011     Years since quitting: 11.9     Passive exposure: Past    Smokeless tobacco: Never   Vaping Use    Vaping status: Never Used   Substance and Sexual Activity    Alcohol use: No    Drug use: No    Sexual activity: Not Currently   Social History Narrative    She lives in [place]. 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 She denies alcohol and illicit drug use.       Social Drivers of Psychologist, prison and probation services Strain: Low Risk  (01/28/2023)    Overall Financial Resource Strain (CARDIA)     Difficulty of Paying Living Expenses: Not hard at all   Food Insecurity: No Food Insecurity (01/28/2023)    Hunger Vital Sign     Worried About Running Out of Food in the Last Year: Never true     Ran Out of Food in the Last Year: Never true   Transportation Needs: No Transportation Needs (01/28/2023)    PRAPARE - Therapist, art (Medical): No     Lack of Transportation (Non-Medical): No   Physical Activity: Inactive (09/09/2017)    Received [facility], [facility]    Exercise Vital Sign     Days of Exercise per Week: 0 days     Minutes of Exercise per Session: 0 min   Stress: No Stress Concern Present (09/09/2017)    Received from [facility], [facility]    [facility] - Occupational Stress Questionnaire     Feeling of Stress : Only a little   Social Connections: Somewhat Isolated (09/09/2017)    Received from [facility], [facility]    Social Connection and Isolation Panel [NHANES]     Frequency of Communication with Friends and Family: More than three times a week  Frequency of Social Gatherings with Friends and Family: Once a week     Attends Religious Services: More than 4 times per year     Active Member of Clubs or Organizations: No     Attends Banker Meetings: Never     Marital Status: Divorced       Current Outpatient Medications:     aspirin (ECOTRIN) 81 MG tablet, Take 1 tablet (81 mg total) by mouth daily., Disp: , Rfl:     blood sugar diagnostic (ONETOUCH ULTRA TEST) Strp, by Other route daily before breakfast. Use as directed., Disp: 100 each, Rfl: 1    cholecalciferol, vitamin D3, (VITAMIN D3 ORAL), Take by mouth., Disp: , Rfl:     clonazePAM (KLONOPIN) 0.5 MG tablet, TAKE THREE TABLETS BY MOUTH AS DIRECTED, Disp: 90 tablet, Rfl: 0    docusate sodium (COLACE) 100 MG capsule, Take 1 capsule (100 mg total) by mouth daily as needed., Disp: , Rfl:     empty container (SHARPS-A-GATOR DISPOSAL SYSTEM) Misc, Use as directed for sharps disposal, Disp: 1 each, Rfl: 2    evolocumab (REPATHA SURECLICK) 140 mg/mL PnIj, Inject the contents of 1 pen (140 mg) under the skin every fourteen (14) days., Disp: 2 mL, Rfl: 11    ferrous gluconate 324 MG tablet, Take 1 tablet (324 mg total) by mouth once., Disp: , Rfl:     FLUoxetine (PROZAC) 40 MG capsule, Take 1 capsule (40 mg total) by mouth daily., Disp: 90 capsule, Rfl: 3    levETIRAcetam (KEPPRA) 1000 MG tablet, TAKE 1 TABLET BY MOUTH 2 TIMES DAILY, Disp: 180 tablet, Rfl: 3    levETIRAcetam (KEPPRA) 250 MG tablet, TAKE ONE (1) TABLET BY MOUTH TWO TIMES PER DAY, Disp: 180 tablet, Rfl: 3    lisinopril (PRINIVIL,ZESTRIL) 5 MG tablet, Take 1 tablet (5 mg total) by mouth daily., Disp: 90 tablet, Rfl: 3    omeprazole 20 mg TbLD, Take by mouth nightly as needed., Disp: , Rfl:     SYNTHROID 100 mcg tablet, Take 1 tablet (100 mcg total) by mouth daily., Disp: 90 tablet, Rfl: 1    traZODone (DESYREL) 50 MG tablet, TAKE 1/2 TO 1 TABLET BY MOUTH AT BEDTIMEAS DIRECTED., Disp: 30 tablet, Rfl: 11    vitamin E acetate (VITAMIN E ORAL), Take by mouth., Disp: , Rfl:     cephalexin (KEFLEX) 250 MG capsule, TAKE ONE CAPSULE DAILY FOR UTI PREVENTION (Patient not taking: Reported on 10/07/2023), Disp: 30 capsule, Rfl: 11  No Known Allergies      Physical Examination:   Blood pressure 138/75, pulse 66, temperature 36.4 ??C (97.6 ??F), temperature source Temporal, resp. rate 18, weight 78.9 kg (174 lb), SpO2 98%.  General Appearance:  No acute distress, well appearing and well nourished.   Head:  Normocephalic, atraumatic.   Eyes:  Conjuctiva and lids appear normal. Pupils equal and round,   sclera anicteric.   Ears:  Overall appearance normal with no scars, lesions or               masses.  Hearing is grossly normal.   Neck: Supple, symmetrical, trachea midline, no adenopathy;   thyroid: no enlargement/tenderness/nodules.   Pulmonary:    Normal respiratory effort.    Musculoskeletal: Normal gait. Extremities without clubbing, cyanosis, or           edema.   Skin: Skin color, texture, turgor normal, no rashes or lesions.   Neurologic: No motor abnormalities noted. Sensation grossly intact.  Lymphatic:  Breast: No cervical or supraclavicular LAD noted.   A comprehensive examination of the breasts and chest wall was performed with the patient upright and supine with arms at her sides and above her head.   Left breast normal in size, normal symmetry, normal contour, no dimpling, nipple everted, nipple exhibits no crusting or excoriation, no masses or nodules, no palpable axillary adenopathy. Skin is slightly hyperpigmented from prior radiation. The breast is tender to palpation. infraclavicular and supraclavicular nodal examinations are negative and arm circumference appears symmetric without evidence of lymphedema. Cosmetic outcome is excellent.  Right breast normal in size, normal symmetry, normal contour, no dimpling, no skin changes, nipple everted, nipple exhibits no crusting or excoriation, no masses or nodules, no palpable axillary adenopathy. It is slightly more ptotic than her left breast.    Psychiatric: Judgement and insight appropriate. Oriented to person,         place, and time.       Imaging Review:  I personally reviewed all diagnostic imaging, including mammogram       Impression      ASSESSMENT:   BI-RADS Category: 2-Mammo1Yr : Benign.  Annual mammography is recommended.      Recommendation Laterality: Both      Annual routine screening mammogram is recommended.                       Narrative   EXAM: MAMMO DIGITAL DIAGNOSTIC TOMO BILATERAL   ACCESSION: [id number] UN      CLINICAL INDICATION: 73 years old Female with breast cancer ; PERSONAL HX MALIG NEOPLASM BREAST  - C50.812 - Malignant neoplasm of overlapping sites of left breast in female, estrogen receptor positive (CMS - HCC) - Z17.0 - Malignant neoplasm of overlapping sites of left breast in female, estrogen re.      COMPARISON: Prior exam(s) were available and reviewed for comparison      TECHNIQUE: Full-field digital diagnostic mammography with tomosynthesis was performed. Tomosynthesis imaging was used to improve the sensitivity and specificity of the detection of breast cancer.      BREAST DENSITY: b - There are scattered areas of fibroglandular density.      FINDINGS:  At the lumpectomy site in the left breast, there is expected density and architectural distortion. There are no suspicious calcifications, dominant masses or unexplained areas of architectural distortion in either breast. There is no mammographic evidence of malignancy.       I have personally reviewed the breast imaging, laboratory values, existing medical records, and pathology. I have summarized these findings and my interpretation in the oncology history and assessment above.    Impression:  Ms. Keil is a 73 y.o. female with a history of left breast infiltrating ductal carcinoma, estrogen receptor positive, progesterone receptor positive and HER2 negative. See treatment summary above.   Staging   Cancer Staging   Malignant neoplasm of female breast (CMS-HCC)  Staging form: Breast, AJCC 8th Edition  - Clinical stage from 12/27/2019: Stage IA (cT1b, cN0, cM0, G1, ER+, PR+, HER2-) - Signed by Talbert Cage, DO on 12/27/2019  - Pathologic stage from 01/29/2020: Stage IA (pT1b, pN0, cM0, G1, ER+, PR+, HER2-) - Signed by Talbert Cage, DO on 02/06/2020    There is no evidence of disease at  3 years.    Recommendations:  Patient previously declined endocrine therapy and is comfortable with this decision.   Follow-up in one year with mammogram. Sooner for any new breast complaints.  Discussed that the breast asymmetry is related to prior surgery and radiation to the left breast. She denies the need for symmetry procedure at this time.     All of the patient's questions and concerns were addressed during the visit and she is in agreement with the plan of care. Please do not hesitate to contact me with questions or concerns.     Cancer Staging   Malignant neoplasm of female breast (CMS-HCC)  Staging form: Breast, AJCC 8th Edition  - Clinical stage from 12/27/2019: Stage IA (cT1b, cN0, cM0, G1, ER+, PR+, HER2-) - Signed by Talbert Cage, DO on 12/27/2019  - Pathologic stage from 01/29/2020: Stage IA (pT1b, pN0, cM0, G1, ER+, PR+, HER2-) - Signed by Talbert Cage, DO on 02/06/2020      Pollyann Samples, NP-C  Surgical Breast Oncology  10/08/2023  9:24 AM

## 2023-10-08 NOTE — Unmapped (Signed)
[facility] Clinical Intervention    Type of intervention: Medication access    Medication involved: Repatha    Problem identified: PA denied for Repatha, patient has been out of it since November due to access    Intervention performed: Sent message to Dr. Barbette Merino to check status and/or follow-up with patient on an alternative to Repatha. Patient requested to be off our call list.     Follow-up needed: n/a    Approximate time spent: 5-10 minutes    Clinical evidence used to support intervention: Chart review    Result of the intervention: Cost savings    [name], PharmD   [facility] Specialty Pharmacist

## 2023-10-11 ENCOUNTER — Inpatient Hospital Stay: Admit: 2023-10-11 | Discharge: 2023-10-11 | Payer: PRIVATE HEALTH INSURANCE

## 2023-11-01 ENCOUNTER — Ambulatory Visit
Admit: 2023-11-01 | Discharge: 2023-11-02 | Payer: PRIVATE HEALTH INSURANCE | Attending: Student in an Organized Health Care Education/Training Program | Primary: Student in an Organized Health Care Education/Training Program

## 2023-11-01 DIAGNOSIS — D649 Anemia, unspecified: Principal | ICD-10-CM

## 2023-11-01 DIAGNOSIS — K5520 Angiodysplasia of colon without hemorrhage: Principal | ICD-10-CM

## 2023-11-01 DIAGNOSIS — R918 Other nonspecific abnormal finding of lung field: Principal | ICD-10-CM

## 2023-11-01 DIAGNOSIS — C50919 Malignant neoplasm of unspecified site of unspecified female breast: Principal | ICD-10-CM

## 2023-11-01 DIAGNOSIS — R499 Unspecified voice and resonance disorder: Principal | ICD-10-CM

## 2023-11-01 DIAGNOSIS — J849 Interstitial pulmonary disease, unspecified: Principal | ICD-10-CM

## 2023-11-01 DIAGNOSIS — E1122 Type 2 diabetes mellitus with diabetic chronic kidney disease: Principal | ICD-10-CM

## 2023-11-01 DIAGNOSIS — J449 Chronic obstructive pulmonary disease, unspecified: Principal | ICD-10-CM

## 2023-11-01 DIAGNOSIS — E039 Hypothyroidism, unspecified: Principal | ICD-10-CM

## 2023-11-01 DIAGNOSIS — R5382 Chronic fatigue, unspecified: Principal | ICD-10-CM

## 2023-11-01 DIAGNOSIS — N390 Urinary tract infection, site not specified: Principal | ICD-10-CM

## 2023-11-01 MED ORDER — CLONAZEPAM 0.5 MG TABLET
ORAL_TABLET | 0 refills | 0.00 days
Start: 2023-11-01 — End: ?

## 2023-11-02 MED ORDER — CLONAZEPAM 0.5 MG TABLET
ORAL_TABLET | 0 refills | 0 days | Status: CP
Start: 2023-11-02 — End: ?

## 2023-11-03 DIAGNOSIS — E039 Hypothyroidism, unspecified: Principal | ICD-10-CM

## 2023-11-03 DIAGNOSIS — R779 Abnormality of plasma protein, unspecified: Principal | ICD-10-CM

## 2023-11-10 MED ORDER — LEVETIRACETAM 1,000 MG TABLET
ORAL_TABLET | Freq: Two times a day (BID) | ORAL | 3 refills | 0.00 days
Start: 2023-11-10 — End: ?

## 2023-11-12 MED ORDER — LEVETIRACETAM 1,000 MG TABLET
ORAL_TABLET | Freq: Two times a day (BID) | ORAL | 3 refills | 90.00 days | Status: CP
Start: 2023-11-12 — End: ?

## 2023-11-15 ENCOUNTER — Ambulatory Visit
Admit: 2023-11-15 | Discharge: 2023-11-16 | Payer: PRIVATE HEALTH INSURANCE | Attending: Nurse Practitioner | Primary: Nurse Practitioner

## 2023-11-15 DIAGNOSIS — D500 Iron deficiency anemia secondary to blood loss (chronic): Principal | ICD-10-CM

## 2023-11-15 DIAGNOSIS — E538 Deficiency of other specified B group vitamins: Principal | ICD-10-CM

## 2023-11-23 ENCOUNTER — Ambulatory Visit: Admit: 2023-11-23 | Discharge: 2023-11-24 | Payer: PRIVATE HEALTH INSURANCE | Attending: Neurology | Primary: Neurology

## 2023-11-23 DIAGNOSIS — Z9181 History of falling: Principal | ICD-10-CM

## 2023-11-23 DIAGNOSIS — G253 Myoclonus: Principal | ICD-10-CM

## 2023-11-23 DIAGNOSIS — R066 Hiccough: Principal | ICD-10-CM

## 2023-11-23 DIAGNOSIS — R2689 Other abnormalities of gait and mobility: Principal | ICD-10-CM

## 2023-11-23 MED ORDER — CLONAZEPAM 0.5 MG TABLET
ORAL_TABLET | 5 refills | 0 days | Status: CP
Start: 2023-11-23 — End: ?

## 2023-11-23 MED ORDER — LEVETIRACETAM 500 MG TABLET
ORAL_TABLET | Freq: Two times a day (BID) | ORAL | 3 refills | 90.00 days | Status: CP
Start: 2023-11-23 — End: 2024-11-22

## 2023-12-07 ENCOUNTER — Ambulatory Visit: Admit: 2023-12-07 | Discharge: 2023-12-08

## 2023-12-07 ENCOUNTER — Inpatient Hospital Stay: Admit: 2023-12-07 | Discharge: 2023-12-08

## 2023-12-07 DIAGNOSIS — J383 Other diseases of vocal cords: Principal | ICD-10-CM

## 2023-12-07 DIAGNOSIS — J849 Interstitial pulmonary disease, unspecified: Principal | ICD-10-CM

## 2023-12-13 ENCOUNTER — Other Ambulatory Visit: Admit: 2023-12-13 | Discharge: 2023-12-14

## 2023-12-13 DIAGNOSIS — R779 Abnormality of plasma protein, unspecified: Principal | ICD-10-CM

## 2023-12-13 DIAGNOSIS — E039 Hypothyroidism, unspecified: Principal | ICD-10-CM

## 2023-12-16 DIAGNOSIS — F32A Depression, unspecified depression type: Principal | ICD-10-CM

## 2023-12-16 MED ORDER — FLUOXETINE 40 MG CAPSULE
ORAL_CAPSULE | Freq: Every day | ORAL | 3 refills | 90.00 days | Status: CP
Start: 2023-12-16 — End: 2024-12-15

## 2023-12-16 MED ORDER — LISINOPRIL 5 MG TABLET
ORAL_TABLET | Freq: Every day | ORAL | 0 refills | 90.00 days | Status: CP
Start: 2023-12-16 — End: ?

## 2023-12-22 ENCOUNTER — Ambulatory Visit
Admission: RE | Admit: 2023-12-22 | Discharge: 2023-12-22 | Disposition: A | Discharge status: Home / Self Care | Source: Ambulatory Visit | Attending: Internal Medicine | Admitting: Internal Medicine

## 2023-12-22 DIAGNOSIS — Z122 Encounter for screening for malignant neoplasm of respiratory organs: Secondary | ICD-10-CM | POA: Diagnosis present

## 2023-12-22 DIAGNOSIS — Z87891 Personal history of nicotine dependence: Secondary | ICD-10-CM | POA: Diagnosis present

## 2024-01-20 ENCOUNTER — Other Ambulatory Visit: Payer: Self-pay

## 2024-01-20 DIAGNOSIS — Z122 Encounter for screening for malignant neoplasm of respiratory organs: Secondary | ICD-10-CM

## 2024-01-20 DIAGNOSIS — F17210 Nicotine dependence, cigarettes, uncomplicated: Secondary | ICD-10-CM

## 2024-01-20 DIAGNOSIS — Z87891 Personal history of nicotine dependence: Secondary | ICD-10-CM

## 2024-02-03 ENCOUNTER — Inpatient Hospital Stay: Admit: 2024-02-03 | Discharge: 2024-02-04 | Payer: Medicare (Managed Care)

## 2024-02-03 DIAGNOSIS — E039 Hypothyroidism, unspecified: Principal | ICD-10-CM

## 2024-02-03 MED ORDER — LEVOTHYROXINE 100 MCG TABLET
ORAL_TABLET | Freq: Every day | ORAL | 1 refills | 90.00000 days | Status: CP
Start: 2024-02-03 — End: 2025-02-02

## 2024-02-24 DIAGNOSIS — Z0000 Encounter for general adult medical examination without abnormal findings: Principal | ICD-10-CM

## 2024-03-01 ENCOUNTER — Ambulatory Visit: Admit: 2024-03-01 | Discharge: 2024-03-02 | Payer: Medicare (Managed Care) | Attending: Family | Primary: Family

## 2024-03-01 DIAGNOSIS — G253 Myoclonus: Principal | ICD-10-CM

## 2024-03-01 DIAGNOSIS — R4586 Emotional lability: Principal | ICD-10-CM

## 2024-03-01 MED ORDER — DULOXETINE 20 MG CAPSULE,DELAYED RELEASE
ORAL_CAPSULE | Freq: Every day | ORAL | 3 refills | 90.00000 days | Status: CP
Start: 2024-03-01 — End: 2025-03-01

## 2024-03-16 MED ORDER — LISINOPRIL 5 MG TABLET
ORAL_TABLET | Freq: Every day | ORAL | 1 refills | 90.00000 days | Status: CP
Start: 2024-03-16 — End: ?

## 2024-03-23 ENCOUNTER — Ambulatory Visit: Admit: 2024-03-23 | Payer: Medicare (Managed Care) | Attending: Radiation Oncology | Primary: Radiation Oncology

## 2024-03-23 DIAGNOSIS — Z170 Estrogen receptor positive status [ER+]: Principal | ICD-10-CM

## 2024-03-23 DIAGNOSIS — C50812 Malignant neoplasm of overlapping sites of left female breast: Principal | ICD-10-CM

## 2024-04-06 MED ORDER — CLONAZEPAM 0.5 MG TABLET
ORAL_TABLET | Freq: Two times a day (BID) | ORAL | 3 refills | 30.00000 days | Status: CP | PRN
Start: 2024-04-06 — End: ?

## 2024-04-25 ENCOUNTER — Inpatient Hospital Stay: Admit: 2024-04-25 | Discharge: 2024-04-26 | Payer: Medicare (Managed Care)

## 2024-05-02 DIAGNOSIS — E039 Hypothyroidism, unspecified: Principal | ICD-10-CM

## 2024-05-02 MED ORDER — LEVOTHYROXINE 100 MCG TABLET
ORAL_TABLET | Freq: Every day | ORAL | 1 refills | 90.00000 days | Status: CP
Start: 2024-05-02 — End: 2025-05-02

## 2024-05-02 MED ORDER — SYNTHROID 100 MCG TABLET
ORAL_TABLET | Freq: Every day | ORAL | 1 refills | 90.00000 days
Start: 2024-05-02 — End: ?

## 2024-08-08 DIAGNOSIS — G4700 Insomnia, unspecified: Principal | ICD-10-CM

## 2024-08-08 MED ORDER — TRAZODONE 50 MG TABLET
ORAL_TABLET | ORAL | 11 refills | 0.00000 days | Status: CP
Start: 2024-08-08 — End: ?

## 2024-08-21 ENCOUNTER — Ambulatory Visit
Admit: 2024-08-21 | Discharge: 2024-08-22 | Payer: Medicare (Managed Care) | Attending: Nurse Practitioner | Primary: Nurse Practitioner

## 2024-08-21 DIAGNOSIS — D500 Iron deficiency anemia secondary to blood loss (chronic): Principal | ICD-10-CM

## 2024-08-21 DIAGNOSIS — E538 Deficiency of other specified B group vitamins: Principal | ICD-10-CM

## 2024-08-21 DIAGNOSIS — D7589 Other specified diseases of blood and blood-forming organs: Principal | ICD-10-CM

## 2024-08-21 DIAGNOSIS — E611 Iron deficiency: Principal | ICD-10-CM

## 2024-08-28 ENCOUNTER — Encounter: Admit: 2024-08-28 | Discharge: 2024-08-29 | Payer: Medicare (Managed Care)

## 2024-09-05 ENCOUNTER — Encounter: Admit: 2024-09-05 | Discharge: 2024-09-05 | Payer: Medicare (Managed Care)

## 2024-09-05 ENCOUNTER — Encounter
Admit: 2024-09-05 | Discharge: 2024-09-05 | Payer: Medicare (Managed Care) | Attending: Student in an Organized Health Care Education/Training Program | Primary: Student in an Organized Health Care Education/Training Program

## 2024-09-05 DIAGNOSIS — I2510 Atherosclerotic heart disease of native coronary artery without angina pectoris: Principal | ICD-10-CM

## 2024-09-05 DIAGNOSIS — E119 Type 2 diabetes mellitus without complications: Principal | ICD-10-CM

## 2024-09-05 DIAGNOSIS — F32A Depression, unspecified depression type: Principal | ICD-10-CM

## 2024-09-05 DIAGNOSIS — R499 Unspecified voice and resonance disorder: Principal | ICD-10-CM

## 2024-09-05 DIAGNOSIS — E039 Hypothyroidism, unspecified: Principal | ICD-10-CM

## 2024-09-05 DIAGNOSIS — J383 Other diseases of vocal cords: Principal | ICD-10-CM

## 2024-09-05 DIAGNOSIS — I10 Essential (primary) hypertension: Principal | ICD-10-CM

## 2024-09-05 MED ORDER — FLUOXETINE 40 MG CAPSULE
ORAL_CAPSULE | Freq: Every day | ORAL | 2 refills | 30.00000 days | Status: CP
Start: 2024-09-05 — End: 2024-12-04

## 2024-09-30 MED ORDER — LISINOPRIL 5 MG TABLET
ORAL_TABLET | Freq: Every day | ORAL | 1 refills | 0.00000 days
Start: 2024-09-30 — End: ?

## 2024-10-02 MED ORDER — LISINOPRIL 5 MG TABLET
ORAL_TABLET | Freq: Every day | ORAL | 1 refills | 90.00000 days | Status: CP
Start: 2024-10-02 — End: ?

## 2024-10-05 ENCOUNTER — Inpatient Hospital Stay: Admit: 2024-10-05 | Discharge: 2024-10-05 | Payer: Medicare (Managed Care)

## 2024-10-05 ENCOUNTER — Ambulatory Visit
Admit: 2024-10-05 | Discharge: 2024-10-05 | Payer: Medicare (Managed Care) | Attending: Adult Health | Primary: Adult Health

## 2024-10-05 DIAGNOSIS — Z170 Estrogen receptor positive status [ER+]: Secondary | ICD-10-CM

## 2024-10-05 DIAGNOSIS — C50812 Malignant neoplasm of overlapping sites of left female breast: Principal | ICD-10-CM
